# Patient Record
Sex: Female | Born: 1945
Health system: Southern US, Community
[De-identification: ages and names within clinical notes are randomized; demographics above are authoritative.]

## PROBLEM LIST (undated history)

## (undated) DIAGNOSIS — G629 Polyneuropathy, unspecified: Secondary | ICD-10-CM

## (undated) DIAGNOSIS — R0600 Dyspnea, unspecified: Secondary | ICD-10-CM

## (undated) DIAGNOSIS — K635 Polyp of colon: Secondary | ICD-10-CM

## (undated) DIAGNOSIS — H3530 Unspecified macular degeneration: Secondary | ICD-10-CM

## (undated) DIAGNOSIS — K219 Gastro-esophageal reflux disease without esophagitis: Secondary | ICD-10-CM

## (undated) DIAGNOSIS — E785 Hyperlipidemia, unspecified: Secondary | ICD-10-CM

## (undated) DIAGNOSIS — Z9889 Other specified postprocedural states: Secondary | ICD-10-CM

## (undated) DIAGNOSIS — Z800 Family history of malignant neoplasm of digestive organs: Secondary | ICD-10-CM

## (undated) DIAGNOSIS — Z923 Personal history of irradiation: Secondary | ICD-10-CM

## (undated) DIAGNOSIS — Z803 Family history of malignant neoplasm of breast: Secondary | ICD-10-CM

## (undated) DIAGNOSIS — H269 Unspecified cataract: Secondary | ICD-10-CM

## (undated) DIAGNOSIS — T7840XA Allergy, unspecified, initial encounter: Secondary | ICD-10-CM

## (undated) DIAGNOSIS — E039 Hypothyroidism, unspecified: Secondary | ICD-10-CM

## (undated) DIAGNOSIS — H35039 Hypertensive retinopathy, unspecified eye: Secondary | ICD-10-CM

## (undated) DIAGNOSIS — R112 Nausea with vomiting, unspecified: Secondary | ICD-10-CM

## (undated) DIAGNOSIS — I10 Essential (primary) hypertension: Secondary | ICD-10-CM

## (undated) DIAGNOSIS — F329 Major depressive disorder, single episode, unspecified: Secondary | ICD-10-CM

## (undated) DIAGNOSIS — F32A Depression, unspecified: Secondary | ICD-10-CM

## (undated) DIAGNOSIS — C50919 Malignant neoplasm of unspecified site of unspecified female breast: Secondary | ICD-10-CM

## (undated) HISTORY — DX: Depression, unspecified: F32.A

## (undated) HISTORY — DX: Unspecified macular degeneration: H35.30

## (undated) HISTORY — PX: ABDOMINAL HYSTERECTOMY: SHX81

## (undated) HISTORY — PX: CATARACT EXTRACTION, BILATERAL: SHX1313

## (undated) HISTORY — PX: SHOULDER SURGERY: SHX246

## (undated) HISTORY — DX: Personal history of irradiation: Z92.3

## (undated) HISTORY — DX: Gastro-esophageal reflux disease without esophagitis: K21.9

## (undated) HISTORY — DX: Allergy, unspecified, initial encounter: T78.40XA

## (undated) HISTORY — DX: Family history of malignant neoplasm of breast: Z80.3

## (undated) HISTORY — DX: Essential (primary) hypertension: I10

## (undated) HISTORY — PX: KNEE SURGERY: SHX244

## (undated) HISTORY — DX: Family history of malignant neoplasm of digestive organs: Z80.0

## (undated) HISTORY — DX: Polyp of colon: K63.5

## (undated) HISTORY — DX: Hypertensive retinopathy, unspecified eye: H35.039

## (undated) HISTORY — DX: Unspecified cataract: H26.9

## (undated) HISTORY — DX: Major depressive disorder, single episode, unspecified: F32.9

## (undated) HISTORY — DX: Hyperlipidemia, unspecified: E78.5

---
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
## 1998-06-17 ENCOUNTER — Emergency Department (HOSPITAL_COMMUNITY): Admission: EM | Admit: 1998-06-17 | Discharge: 1998-06-17 | Payer: Self-pay | Admitting: Emergency Medicine

## 1998-06-21 ENCOUNTER — Encounter: Admission: RE | Admit: 1998-06-21 | Discharge: 1998-06-21 | Payer: Self-pay | Admitting: Family Medicine

## 1998-11-24 ENCOUNTER — Encounter: Admission: RE | Admit: 1998-11-24 | Discharge: 1998-11-24 | Payer: Self-pay | Admitting: Family Medicine

## 1998-12-29 ENCOUNTER — Encounter: Admission: RE | Admit: 1998-12-29 | Discharge: 1998-12-29 | Payer: Self-pay | Admitting: Family Medicine

## 1999-01-25 ENCOUNTER — Encounter: Admission: RE | Admit: 1999-01-25 | Discharge: 1999-01-25 | Payer: Self-pay | Admitting: Family Medicine

## 1999-03-07 ENCOUNTER — Other Ambulatory Visit: Admission: RE | Admit: 1999-03-07 | Discharge: 1999-03-07 | Payer: Self-pay | Admitting: *Deleted

## 1999-03-24 ENCOUNTER — Encounter: Admission: RE | Admit: 1999-03-24 | Discharge: 1999-03-24 | Payer: Self-pay | Admitting: Family Medicine

## 1999-04-19 ENCOUNTER — Encounter: Admission: RE | Admit: 1999-04-19 | Discharge: 1999-04-19 | Payer: Self-pay | Admitting: Sports Medicine

## 1999-06-09 ENCOUNTER — Ambulatory Visit (HOSPITAL_COMMUNITY): Admission: RE | Admit: 1999-06-09 | Discharge: 1999-06-09 | Payer: Self-pay | Admitting: Gastroenterology

## 1999-07-25 ENCOUNTER — Encounter: Admission: RE | Admit: 1999-07-25 | Discharge: 1999-07-25 | Payer: Self-pay | Admitting: Family Medicine

## 1999-08-10 ENCOUNTER — Encounter: Admission: RE | Admit: 1999-08-10 | Discharge: 1999-08-10 | Payer: Self-pay | Admitting: Family Medicine

## 1999-09-13 ENCOUNTER — Encounter: Admission: RE | Admit: 1999-09-13 | Discharge: 1999-09-13 | Payer: Self-pay | Admitting: Family Medicine

## 1999-10-18 ENCOUNTER — Encounter: Admission: RE | Admit: 1999-10-18 | Discharge: 1999-10-18 | Payer: Self-pay | Admitting: Sports Medicine

## 2000-01-13 ENCOUNTER — Encounter: Admission: RE | Admit: 2000-01-13 | Discharge: 2000-01-13 | Payer: Self-pay | Admitting: Family Medicine

## 2000-04-20 ENCOUNTER — Encounter: Admission: RE | Admit: 2000-04-20 | Discharge: 2000-04-20 | Payer: Self-pay | Admitting: Sports Medicine

## 2000-09-13 ENCOUNTER — Encounter: Admission: RE | Admit: 2000-09-13 | Discharge: 2000-09-13 | Payer: Self-pay | Admitting: Family Medicine

## 2000-09-20 ENCOUNTER — Encounter: Admission: RE | Admit: 2000-09-20 | Discharge: 2000-09-20 | Payer: Self-pay | Admitting: Family Medicine

## 2000-09-24 ENCOUNTER — Encounter: Admission: RE | Admit: 2000-09-24 | Discharge: 2000-09-24 | Payer: Self-pay | Admitting: Family Medicine

## 2001-02-19 ENCOUNTER — Encounter: Admission: RE | Admit: 2001-02-19 | Discharge: 2001-02-19 | Payer: Self-pay | Admitting: Family Medicine

## 2001-03-13 ENCOUNTER — Encounter: Admission: RE | Admit: 2001-03-13 | Discharge: 2001-03-13 | Payer: Self-pay | Admitting: Family Medicine

## 2001-04-05 ENCOUNTER — Encounter: Admission: RE | Admit: 2001-04-05 | Discharge: 2001-04-05 | Payer: Self-pay | Admitting: Family Medicine

## 2001-07-02 ENCOUNTER — Encounter: Admission: RE | Admit: 2001-07-02 | Discharge: 2001-07-02 | Payer: Self-pay | Admitting: Family Medicine

## 2001-07-08 ENCOUNTER — Encounter: Admission: RE | Admit: 2001-07-08 | Discharge: 2001-07-08 | Payer: Self-pay | Admitting: Family Medicine

## 2001-07-11 ENCOUNTER — Encounter: Admission: RE | Admit: 2001-07-11 | Discharge: 2001-07-11 | Payer: Self-pay | Admitting: Family Medicine

## 2001-11-05 ENCOUNTER — Encounter: Admission: RE | Admit: 2001-11-05 | Discharge: 2001-11-05 | Payer: Self-pay | Admitting: Family Medicine

## 2002-03-24 ENCOUNTER — Encounter: Admission: RE | Admit: 2002-03-24 | Discharge: 2002-03-24 | Payer: Self-pay | Admitting: Family Medicine

## 2002-03-31 ENCOUNTER — Encounter: Admission: RE | Admit: 2002-03-31 | Discharge: 2002-03-31 | Payer: Self-pay | Admitting: Family Medicine

## 2002-04-02 ENCOUNTER — Encounter: Admission: RE | Admit: 2002-04-02 | Discharge: 2002-04-02 | Payer: Self-pay | Admitting: Family Medicine

## 2002-04-04 ENCOUNTER — Encounter: Admission: RE | Admit: 2002-04-04 | Discharge: 2002-04-04 | Payer: Self-pay | Admitting: Family Medicine

## 2002-04-24 ENCOUNTER — Encounter (INDEPENDENT_AMBULATORY_CARE_PROVIDER_SITE_OTHER): Payer: Self-pay | Admitting: *Deleted

## 2002-04-25 ENCOUNTER — Encounter: Admission: RE | Admit: 2002-04-25 | Discharge: 2002-04-25 | Payer: Self-pay | Admitting: Family Medicine

## 2002-05-23 ENCOUNTER — Encounter: Admission: RE | Admit: 2002-05-23 | Discharge: 2002-05-23 | Payer: Self-pay | Admitting: Family Medicine

## 2002-06-10 ENCOUNTER — Encounter: Admission: RE | Admit: 2002-06-10 | Discharge: 2002-06-10 | Payer: Self-pay | Admitting: Family Medicine

## 2003-05-29 ENCOUNTER — Encounter: Payer: Self-pay | Admitting: Emergency Medicine

## 2003-05-29 ENCOUNTER — Emergency Department (HOSPITAL_COMMUNITY): Admission: EM | Admit: 2003-05-29 | Discharge: 2003-05-29 | Payer: Self-pay | Admitting: Emergency Medicine

## 2003-06-18 ENCOUNTER — Encounter: Admission: RE | Admit: 2003-06-18 | Discharge: 2003-06-18 | Payer: Self-pay | Admitting: Family Medicine

## 2003-12-03 ENCOUNTER — Encounter: Admission: RE | Admit: 2003-12-03 | Discharge: 2003-12-03 | Payer: Self-pay | Admitting: Family Medicine

## 2004-02-12 ENCOUNTER — Emergency Department (HOSPITAL_COMMUNITY): Admission: EM | Admit: 2004-02-12 | Discharge: 2004-02-12 | Payer: Self-pay | Admitting: Emergency Medicine

## 2004-04-01 ENCOUNTER — Ambulatory Visit (HOSPITAL_COMMUNITY): Admission: RE | Admit: 2004-04-01 | Discharge: 2004-04-01 | Payer: Self-pay | Admitting: Gastroenterology

## 2004-04-01 ENCOUNTER — Encounter (INDEPENDENT_AMBULATORY_CARE_PROVIDER_SITE_OTHER): Payer: Self-pay | Admitting: *Deleted

## 2005-01-23 ENCOUNTER — Ambulatory Visit: Payer: Self-pay | Admitting: Family Medicine

## 2005-01-31 ENCOUNTER — Ambulatory Visit: Payer: Self-pay | Admitting: Sports Medicine

## 2006-03-01 ENCOUNTER — Ambulatory Visit: Payer: Self-pay | Admitting: Family Medicine

## 2006-03-01 ENCOUNTER — Encounter: Admission: RE | Admit: 2006-03-01 | Discharge: 2006-03-01 | Payer: Self-pay | Admitting: Sports Medicine

## 2007-02-21 DIAGNOSIS — J329 Chronic sinusitis, unspecified: Secondary | ICD-10-CM | POA: Insufficient documentation

## 2007-02-21 DIAGNOSIS — J309 Allergic rhinitis, unspecified: Secondary | ICD-10-CM | POA: Insufficient documentation

## 2007-02-21 DIAGNOSIS — I10 Essential (primary) hypertension: Secondary | ICD-10-CM | POA: Insufficient documentation

## 2007-02-21 DIAGNOSIS — K5730 Diverticulosis of large intestine without perforation or abscess without bleeding: Secondary | ICD-10-CM | POA: Insufficient documentation

## 2007-02-21 DIAGNOSIS — E781 Pure hyperglyceridemia: Secondary | ICD-10-CM | POA: Insufficient documentation

## 2007-02-21 DIAGNOSIS — N3941 Urge incontinence: Secondary | ICD-10-CM | POA: Insufficient documentation

## 2007-02-21 DIAGNOSIS — E039 Hypothyroidism, unspecified: Secondary | ICD-10-CM | POA: Insufficient documentation

## 2007-02-21 DIAGNOSIS — F17200 Nicotine dependence, unspecified, uncomplicated: Secondary | ICD-10-CM | POA: Insufficient documentation

## 2007-02-21 DIAGNOSIS — R12 Heartburn: Secondary | ICD-10-CM | POA: Insufficient documentation

## 2007-02-21 DIAGNOSIS — F339 Major depressive disorder, recurrent, unspecified: Secondary | ICD-10-CM | POA: Insufficient documentation

## 2007-02-21 DIAGNOSIS — E785 Hyperlipidemia, unspecified: Secondary | ICD-10-CM | POA: Insufficient documentation

## 2007-02-22 ENCOUNTER — Encounter (INDEPENDENT_AMBULATORY_CARE_PROVIDER_SITE_OTHER): Payer: Self-pay | Admitting: *Deleted

## 2007-03-28 ENCOUNTER — Telehealth: Payer: Self-pay | Admitting: Family Medicine

## 2007-04-24 ENCOUNTER — Encounter: Payer: Self-pay | Admitting: *Deleted

## 2007-04-24 ENCOUNTER — Encounter: Payer: Self-pay | Admitting: Family Medicine

## 2007-04-24 ENCOUNTER — Ambulatory Visit: Payer: Self-pay | Admitting: Family Medicine

## 2007-04-24 DIAGNOSIS — J45909 Unspecified asthma, uncomplicated: Secondary | ICD-10-CM | POA: Insufficient documentation

## 2007-04-24 LAB — CONVERTED CEMR LAB
AST: 15 units/L (ref 0–37)
BUN: 20 mg/dL (ref 6–23)
Calcium: 9.4 mg/dL (ref 8.4–10.5)
Chloride: 101 meq/L (ref 96–112)
Creatinine, Ser: 0.8 mg/dL (ref 0.40–1.20)
Direct LDL: 177 mg/dL — ABNORMAL HIGH
Glucose, Bld: 78 mg/dL (ref 70–99)
TSH: 0.035 microintl units/mL — ABNORMAL LOW (ref 0.350–5.50)

## 2007-04-25 ENCOUNTER — Encounter: Payer: Self-pay | Admitting: Family Medicine

## 2007-06-05 ENCOUNTER — Ambulatory Visit: Payer: Self-pay | Admitting: Family Medicine

## 2007-07-15 ENCOUNTER — Encounter: Payer: Self-pay | Admitting: Family Medicine

## 2007-07-17 ENCOUNTER — Encounter: Payer: Self-pay | Admitting: Family Medicine

## 2007-07-19 ENCOUNTER — Encounter: Payer: Self-pay | Admitting: Family Medicine

## 2007-12-03 ENCOUNTER — Ambulatory Visit: Payer: Self-pay | Admitting: Family Medicine

## 2007-12-04 ENCOUNTER — Encounter: Payer: Self-pay | Admitting: Family Medicine

## 2007-12-04 ENCOUNTER — Ambulatory Visit: Payer: Self-pay | Admitting: Family Medicine

## 2007-12-04 LAB — CONVERTED CEMR LAB
ALT: 22 units/L (ref 0–35)
AST: 13 units/L (ref 0–37)
Albumin: 4.1 g/dL (ref 3.5–5.2)
Alkaline Phosphatase: 63 units/L (ref 39–117)
Glucose, Bld: 82 mg/dL (ref 70–99)
LDL Cholesterol: 68 mg/dL (ref 0–99)
Potassium: 4.1 meq/L (ref 3.5–5.3)
Sodium: 139 meq/L (ref 135–145)
TSH: 0.054 microintl units/mL — ABNORMAL LOW (ref 0.350–5.50)
Total Bilirubin: 0.4 mg/dL (ref 0.3–1.2)
Total Protein: 7 g/dL (ref 6.0–8.3)
VLDL: 33 mg/dL (ref 0–40)

## 2007-12-13 ENCOUNTER — Encounter: Payer: Self-pay | Admitting: Family Medicine

## 2008-01-21 ENCOUNTER — Encounter: Payer: Self-pay | Admitting: Family Medicine

## 2008-04-20 ENCOUNTER — Ambulatory Visit: Payer: Self-pay | Admitting: Family Medicine

## 2008-04-20 ENCOUNTER — Encounter: Payer: Self-pay | Admitting: Family Medicine

## 2008-04-21 LAB — CONVERTED CEMR LAB: TSH: 2.123 microintl units/mL (ref 0.350–5.50)

## 2008-08-12 ENCOUNTER — Telehealth: Payer: Self-pay | Admitting: *Deleted

## 2008-11-13 ENCOUNTER — Telehealth: Payer: Self-pay | Admitting: *Deleted

## 2008-12-04 ENCOUNTER — Ambulatory Visit: Payer: Self-pay | Admitting: Family Medicine

## 2008-12-04 ENCOUNTER — Encounter: Payer: Self-pay | Admitting: Family Medicine

## 2008-12-04 LAB — CONVERTED CEMR LAB

## 2008-12-09 ENCOUNTER — Encounter: Payer: Self-pay | Admitting: Family Medicine

## 2008-12-09 ENCOUNTER — Ambulatory Visit: Payer: Self-pay | Admitting: Family Medicine

## 2008-12-09 LAB — CONVERTED CEMR LAB
ALT: 24 units/L (ref 0–35)
Alkaline Phosphatase: 62 units/L (ref 39–117)
Cholesterol: 188 mg/dL (ref 0–200)
Creatinine, Ser: 1.02 mg/dL (ref 0.40–1.20)
LDL Cholesterol: 106 mg/dL — ABNORMAL HIGH (ref 0–99)
Sodium: 138 meq/L (ref 135–145)
Total Bilirubin: 0.4 mg/dL (ref 0.3–1.2)
Total CHOL/HDL Ratio: 4.8
Total Protein: 7.4 g/dL (ref 6.0–8.3)
VLDL: 43 mg/dL — ABNORMAL HIGH (ref 0–40)

## 2008-12-21 ENCOUNTER — Telehealth: Payer: Self-pay | Admitting: *Deleted

## 2009-03-17 ENCOUNTER — Telehealth: Payer: Self-pay | Admitting: *Deleted

## 2009-05-19 ENCOUNTER — Telehealth: Payer: Self-pay | Admitting: *Deleted

## 2009-09-15 ENCOUNTER — Telehealth: Payer: Self-pay | Admitting: Family Medicine

## 2009-09-15 ENCOUNTER — Encounter: Admission: RE | Admit: 2009-09-15 | Discharge: 2009-09-15 | Payer: Self-pay | Admitting: Sports Medicine

## 2009-09-15 ENCOUNTER — Ambulatory Visit: Payer: Self-pay | Admitting: Family Medicine

## 2009-09-28 ENCOUNTER — Telehealth: Payer: Self-pay | Admitting: Family Medicine

## 2009-12-15 ENCOUNTER — Telehealth (INDEPENDENT_AMBULATORY_CARE_PROVIDER_SITE_OTHER): Payer: Self-pay | Admitting: *Deleted

## 2009-12-16 ENCOUNTER — Ambulatory Visit: Payer: Self-pay | Admitting: Family Medicine

## 2009-12-30 ENCOUNTER — Encounter: Payer: Self-pay | Admitting: Family Medicine

## 2010-01-11 ENCOUNTER — Ambulatory Visit: Payer: Self-pay | Admitting: Family Medicine

## 2010-01-20 ENCOUNTER — Encounter: Payer: Self-pay | Admitting: Family Medicine

## 2010-01-21 ENCOUNTER — Encounter: Payer: Self-pay | Admitting: Family Medicine

## 2010-01-21 LAB — CONVERTED CEMR LAB
AST: 16 units/L
Alkaline Phosphatase: 63 units/L
BUN: 14 mg/dL
Calcium: 9.2 mg/dL
GFR calc Af Amer: 0.4 mL/min
Glucose, Bld: 84 mg/dL
HCT: 43.6 %
MCV: 95.4 fL
Potassium: 3.9 meq/L
Sodium: 136 meq/L

## 2010-03-18 ENCOUNTER — Encounter: Payer: Self-pay | Admitting: Family Medicine

## 2010-03-22 ENCOUNTER — Encounter: Payer: Self-pay | Admitting: Family Medicine

## 2010-05-19 ENCOUNTER — Telehealth: Payer: Self-pay | Admitting: Family Medicine

## 2010-05-30 ENCOUNTER — Telehealth: Payer: Self-pay | Admitting: Family Medicine

## 2010-10-20 ENCOUNTER — Telehealth: Payer: Self-pay | Admitting: Family Medicine

## 2010-11-05 ENCOUNTER — Emergency Department (HOSPITAL_COMMUNITY): Admission: EM | Admit: 2010-11-05 | Discharge: 2010-11-05 | Payer: Self-pay | Admitting: Emergency Medicine

## 2011-01-24 ENCOUNTER — Telehealth: Payer: Self-pay | Admitting: Family Medicine

## 2011-01-24 NOTE — Assessment & Plan Note (Signed)
 Summary: bronchitis per pt/Shannon/everhart   Vital Signs:  Patient profile:   65 year old female Height:      62 inches Weight:      192.2 pounds BMI:     35.28 O2 Sat:      96 % on Room air Temp:     98.0 degrees F oral Pulse rate:   88 / minute BP sitting:   149 / 85  (left arm) Cuff size:   large  Vitals Entered By: Katie Mulberry LPN (September 15, 2009 10:20 AM)  O2 Flow:  Room air CC: ? bronchitis x 6 days Is Patient Diabetic? No Pain Assessment Patient in pain? no        Primary Care Provider:  JEREL PRESCOTT  MD  CC:  ? bronchitis x 6 days.  History of Present Illness: 24F with cough for 6 days, hx smoking, 1/2PPD, hx asthma.  Staying about the same.  Started this past thursday.  No fevers/chills, no N/V/D, no muscle aches or body aches. Nose running, lots of chest congestion.  Coughing up yellowish mucus, no blood.  No CP, mild SOB, wheeze, no swelling in LE. No sick contacts.    Habits & Providers  Alcohol-Tobacco-Diet     Tobacco Status: current     Cigarette Packs/Day: 1.0  Past History:  Past Medical History: Last updated: 12/03/2007 Rectocele, Recurrent sinusitis (Ceftin 250mg  bid); routine colonoscopys, mammograms.  Past Surgical History: Last updated: 12/03/2007 deviated septum -, hysterectomy - 03/99 -, lumpectomy X 5 -, Oophorectomy -, T & A - age 43 -; 3 polyps removed with last colonscopy (gets q 5 years).  Family History: Last updated: 12/04/2008 Colon CA - great grandmother, one aunt, two uncles Postmenopausal cancer in paternal aunt.  Social History: Last updated: 12/04/2008 Lives with her husband of 35 years.  Husband heavily involved in shriners. Has 2 children and 4 grandchildren.  Smokes 1 ppd, not interested in quitting at this time. Drinks 2-3 glasses of wine per week. Enjoys watching her grandchildren's band concerts. Retired runner, broadcasting/film/video - substitutes occasionally.  Social History: Packs/Day:  1.0  Review of Systems       See  HPI  Physical Exam  General:  Well-developed,well-nourished,in no acute distress; alert,appropriate and cooperative throughout examination Head:  Normocephalic and atraumatic without obvious abnormalities. No apparent alopecia or balding. Ears:  Cerumen impaction in right EAM.  Flushed, clear. Nose:  External nasal examination shows no deformity or inflammation. Nasal mucosa are pink and moist without lesions or exudates. Mouth:  Oral mucosa and oropharynx without lesions or exudates.  Lungs:  Good air movement but exp wheezes present throughout, rhonchi present throughout, some dullness to percussion in left lower field posteriorly. Heart:  Normal rate and regular rhythm. S1 and S2 normal without gallop, murmur, click, rub or other extra sounds. Extremities:  No edema.   Impression & Recommendations:  Problem # 1:  COUGH (ICD-786.2) Assessment New Stable, normal spO2.  Exam suggestive of acute bronchitis superimposed on obstructive asthma.  Pt still smoking, encouraged to abstain.  With abnormal lung exam however will need to get CXR to r/o infiltrate.  Will treat as exacerbation of COPD (Acute bronchitis on top of asthma) with Doxy, prednisone , bronchodilators, zyrtec/flonase  for coryza symptoms, stay hydrated, RTC if no better in 2 weeks.  If PNA identified, can come back for rocephin shot and azithromycin for outpt treatment.  Orders: CXR- 2view (CXR) [facility]- Est Level  3 (00786)  Complete Medication List: 1)  Albuterol  90  Mcg/act Aers (Albuterol ) .... Inhale 2 puff using inhaler every four hour. dispense 1 inhaler 2)  Prilosec Otc 20 Mg Tbec (Omeprazole  magnesium ) .... Take 1 tablet by mouth once a day 3)  Simvastatin  20 Mg Tabs (Simvastatin ) .... Take 1 tablet by mouth at bedtime 4)  Synthroid  100 Mcg Tabs (Levothyroxine  sodium) .... Take one by mouth daily 5)  Lisinopril -hydrochlorothiazide  20-25 Mg Tabs (Lisinopril -hydrochlorothiazide ) .... One by mouth daily 6)  Imipramine  Hcl 50  Mg Tabs (Imipramine  hcl) .... 2 tabs by mouth daily 7)  Doxycycline Hyclate 100 Mg Caps (Doxycycline hyclate) .... One tab by mouth two times a day x 7 days 8)  Zyrtec Allergy 10 Mg Caps (Cetirizine hcl) .... One tab by mouth daily 9)  Flonase  50 Mcg/act Susp (Fluticasone  propionate) .... One spray in each nostril bid 10)  Prednisone  20 Mg Tabs (Prednisone ) .... Two tabs by mouth daily x 5 days 11)  Mucinex Maximum Strength 1200 Mg Xr12h-tab (Guaifenesin) .... One tab by mouth two times a day  Patient Instructions: 1)  Great to meet you today, 2)  You likely have a bronchitis exacerbation,  worsened by your smoking. 3)  We will treat aggressively with doxycycline for 7 days, prednisone  for 5 days, mucinex, and an antihistamine.  You can pick up the flonase  nasal spray if your runny nose doesn't resolve. 4)  We also flushed your ear today. 5)  -Come back in 2 weeks if not feeling ANY better.  Be sure to complete the courses of antibiotics and prednisone . 6)  -Dr. ONEIDA.  Prescriptions: MUCINEX MAXIMUM STRENGTH 1200 MG XR12H-TAB (GUAIFENESIN) One tab by mouth two times a day  #14 x 0   Entered and Authorized by:   Debby Petties MD   Signed by:   Debby Petties MD on 09/15/2009   Method used:   Electronically to        [facility] (retail)       [address]       [place], [place]  [address]       Ph: [phone]       Fax: [phone]   RxID:   [id number] DOXYCYCLINE HYCLATE 100 MG CAPS (DOXYCYCLINE HYCLATE) One tab by mouth two times a day x 7 days  #14 x 0   Entered and Authorized by:   Debby Petties MD   Signed by:   Debby Petties MD on 09/15/2009   Method used:   Electronically to        [facility] (retail)       [address]       [place], [place]  [address]       Ph: [phone]       Fax: [phone]   RxID:   [id number] PREDNISONE  20 MG TABS (PREDNISONE ) Two tabs by mouth daily x 5  days  #10 x 0   Entered and Authorized by:   Debby Petties MD   Signed by:   Debby Petties MD on 09/15/2009   Method used:   Electronically to        [facility] (retail)       [address]       [place], [place]  [address]       Ph: [phone]       Fax: [phone]  RxID:   [id number] FLONASE  50 MCG/ACT SUSP (FLUTICASONE  PROPIONATE) One spray in each nostril BID  #1 bottle x 0   Entered and Authorized by:   Debby Petties MD   Signed by:   Debby Petties MD on 09/15/2009   Method used:   Electronically to        [facility] (retail)       [address]       [place], [place]  [address]       Ph: [phone]       Fax: [phone]   RxID:   [id number] ZYRTEC ALLERGY 10 MG CAPS (CETIRIZINE HCL) One tab by mouth daily  #10 x 0   Entered and Authorized by:   Debby Petties MD   Signed by:   Debby Petties MD on 09/15/2009   Method used:   Electronically to        [facility] (retail)       [address]       [place], [place]  [address]       Ph: [phone]       Fax: [phone]   RxID:   [id number]

## 2011-01-24 NOTE — Progress Notes (Signed)
Summary: RX  Phone Note Refill Request Call back at Home Phone [phone]   Refills Requested: Medication #1:  ALBUTEROL 90 MCG/ACT AERS Inhale 2 puff using inhaler every four hour. Dispense 1 inhaler pt requesting generic, pt would like a call once done.  Initial call taken by: Knox Royalty,  May 30, 2010 8:34 AM    Prescriptions: ALBUTEROL 90 MCG/ACT AERS (ALBUTEROL) Inhale 2 puff using inhaler every four hour. Dispense 1 inhaler  #1 x 5   Entered and Authorized by:   Myrtie Soman  MD   Signed by:   Myrtie Soman  MD on 05/30/2010   Method used:   Electronically to        [facility] (retail)       [address]       [place], [place]  [address]       Ph: [phone]       Fax: [phone]   RxID:   [id number]

## 2011-01-24 NOTE — Miscellaneous (Signed)
Summary: lab update  Clinical Lists Changes  Observations: Added new observation of PLATELETK/UL: 316 K/uL (01/21/2010 21:17) Added new observation of MCV: 95.4 fL (01/21/2010 21:17) Added new observation of HCT: 43.6 % (01/21/2010 21:17) Added new observation of HGB: 14.2 g/dL (16/09/9603 54:09) Added new observation of WBC COUNT: 8.6 10*3/microliter (01/21/2010 21:17) Added new observation of CALCIUM: 9.2 mg/dL (81/19/1478 29:56) Added new observation of ALBUMIN: 4.3 g/dL (21/30/8657 84:69) Added new observation of SGPT (ALT): 22 units/L (01/21/2010 21:17) Added new observation of SGOT (AST): 16 units/L (01/21/2010 21:17) Added new observation of ALK PHOS: 63 units/L (01/21/2010 21:17) Added new observation of GFRAA: 0.4 mL/min (01/21/2010 21:17) Added new observation of CREATININE: 0.89 mg/dL (62/95/2841 32:44) Added new observation of BUN: 14 mg/dL (12/27/7251 66:44) Added new observation of BG RANDOM: 84 mg/dL (03/47/4259 56:38) Added new observation of CO2 PLSM/SER: 25 meq/L (01/21/2010 21:17) Added new observation of CL SERUM: 99 meq/L (01/21/2010 21:17) Added new observation of K SERUM: 3.9 meq/L (01/21/2010 21:17) Added new observation of NA: 136 meq/L (01/21/2010 21:17)

## 2011-01-24 NOTE — Progress Notes (Signed)
Summary: refill  Phone Note Refill Request Call back at Home Phone [phone] Message from:  Patient  Refills Requested: Medication #1:  IMIPRAMINE HCL 50 MG TABS take 3 tablets at night for depression pt didn't realize she was out and needs enough to last until other meds come in from [facility] - needs at least a 7 day supply [facility]  Initial call taken by: De Nurse,  October 20, 2010 9:32 AM  Follow-up for Phone Call       Follow-up by: Antoine Primas DO,  October 20, 2010 1:14 PM    Prescriptions: IMIPRAMINE HCL 50 MG TABS (IMIPRAMINE HCL) take 3 tablets at night for depression  #270 x 0   Entered and Authorized by:   Antoine Primas DO   Signed by:   Antoine Primas DO on 10/20/2010   Method used:   Electronically to        [facility] (retail)       [address]       [place], [place]  [address]       Ph: [phone]       Fax: [phone]   RxID:   [id number]

## 2011-01-24 NOTE — Miscellaneous (Signed)
Summary: colonoscopy report  Clinical Lists Changes  Observations: Added new observation of COLONNXTDUE: 03/18/2013 (03/22/2010 14:16) Added new observation of DM PROGRESS: N/A (03/22/2010 14:16) Added new observation of DM FSREVIEW: N/A (03/22/2010 14:16) Added new observation of COLONOSCOPY: sessile polyps, otherwise normal. f/u in 3 years (03/18/2010 14:16)      Prevention & Chronic Care Immunizations   Influenza vaccine: Fluvax Non-MCR  (12/16/2009)   Influenza vaccine due: 12/02/2008    Tetanus booster: 02/28/2006: Done.   Tetanus booster due: 02/29/2016    Pneumococcal vaccine: Pneumovax  (01/11/2010)    H. zoster vaccine: Not documented  Colorectal Screening   Hemoccult: n/a  (12/04/2008)   Hemoccult due: Not Indicated    Colonoscopy: sessile polyps, otherwise normal. f/u in 3 years  (03/18/2010)   Colonoscopy due: 03/18/2013  Other Screening   Pap smear: n/a  (12/04/2008)   Pap smear due: Not Indicated    Mammogram: normal  (01/04/2010)   Mammogram due: 01/04/2011    DXA bone density scan: Not documented   Smoking status: current  (01/11/2010)   Smoking cessation counseling: YES  (12/16/2009)  Lipids   Total Cholesterol: 188  (12/09/2008)   LDL: 106  (12/09/2008)   LDL Direct: 177  (04/24/2007)   HDL: 39  (12/09/2008)   Triglycerides: 216  (12/09/2008)    SGOT (AST): 16  (01/21/2010)   SGPT (ALT): 22  (01/21/2010)   Alkaline phosphatase: 63  (01/21/2010)   Total bilirubin: 0.4  (12/09/2008)  Hypertension   Last Blood Pressure: 112 / 68  (01/11/2010)   Serum creatinine: 0.89  (01/21/2010)   Serum potassium 3.9  (01/21/2010)  Self-Management Support :   Personal Goals (by the next clinic visit) :      Personal blood pressure goal: 140/90  (01/11/2010)     Personal LDL goal: 100  (01/11/2010)    Hypertension self-management support: Not documented    Lipid self-management support: Not documented

## 2011-01-24 NOTE — Consult Note (Signed)
Summary: [facility]   Imported By: De Nurse 01/28/2010 16:59:03  _____________________________________________________________________  External Attachment:    Type:   Image     Comment:   External Document

## 2011-01-24 NOTE — Progress Notes (Signed)
Summary: Rx Req  Phone Note Refill Request Call back at [phone] Message from:  Patient  Refills Requested: Medication #1:  IMIPRAMINE HCL 50 MG TABS take 3 tablets at night for depression [facility] [address].  PT SAYS SHE IS GOING OUT OF TOWN TOMORROW CAN IT BE FILLED TODAY.  Initial call taken by: Clydell Hakim,  May 19, 2010 10:29 AM    Prescriptions: IMIPRAMINE HCL 50 MG TABS (IMIPRAMINE HCL) take 3 tablets at night for depression  #90 x 0   Entered and Authorized by:   Myrtie Soman  MD   Signed by:   Myrtie Soman  MD on 05/19/2010   Method used:   Electronically to        [facility] (retail)       [address]       [place], [place]  [address]       Ph: [phone]       Fax: [phone]   RxID:   [id number]

## 2011-01-24 NOTE — Assessment & Plan Note (Signed)
Summary: cpe & flu shot,tcb   Vital Signs:  Patient profile:   65 year old female Height:      62 inches Weight:      193.1 pounds BMI:     35.45 Temp:     98.2 degrees F oral Pulse rate:   108 / minute BP sitting:   112 / 68  (left arm) Cuff size:   regular  Vitals Entered By: Garen Grams LPN (January 11, 2010 3:50 PM) CC: CPE Is Patient Diabetic? No Pain Assessment Patient in pain? no        Primary Care Provider:  Myrtie Soman  MD  CC:  CPE.  History of Present Illness: 1. depression States that she's increasing agitated and bothered, especially when it comes to caring for her parents. Gets upset more easily than she has previously. Reports increased feelings of stress and increase in depressed mood. Denies any SI.  2. smoking  Not ready to quit. Will let us know if she decides to stop.  3. prevention Eliglbe for PNA shot. Mammograms up to date. Has pre-colonoscopy eval soon - gets them every 5 years given her family history. s/p hysterectomy.   Habits & Providers  Alcohol-Tobacco-Diet     Tobacco Status: current     Cigarette Packs/Day: 0.5  Current Medications (verified): 1)  Albuterol 90 Mcg/act Aers (Albuterol) .... Inhale 2 Puff Using Inhaler Every Four Hour. Dispense 1 Inhaler 2)  Prilosec Otc 20 Mg Tbec (Omeprazole Magnesium) .... Take 1 Tablet By Mouth Once A Day 3)  Simvastatin 20 Mg Tabs (Simvastatin) .... Take 1 Tablet By Mouth At Bedtime 4)  Synthroid 100 Mcg  Tabs (Levothyroxine Sodium) .... Take One By Mouth Daily 5)  Lisinopril-Hydrochlorothiazide 20-25 Mg  Tabs (Lisinopril-Hydrochlorothiazide) .... One By Mouth Daily 6)  Imipramine Hcl 50 Mg  Tabs (Imipramine Hcl) .... 2 Tabs By Mouth Daily 7)  Flonase 50 Mcg/act Susp (Fluticasone Propionate) .... One Spray in Each Nostril Bid  Allergies (verified): No Known Drug Allergies  Past History:  Past Medical History: Rectocele, recurrent sinusitis; routine colonoscopys,  mammograms. Asthma  Family History: Colon CA - great grandmother, one aunt, two uncles Postmenopausal breast cancer in paternal aunt paternal grandfather leukemia  diabetes maternal aunt, uncle HTN - multiple family members  Social History: Lives with her husband of 35 years.  Husband heavily involved in shriners. Has 2 children and 4 grandchildren -- has a 5th on the way -- due 8/11 .  Smokes 1/2 ppd, not interested in quitting at this time.  Drinks 2-3 glasses of wine per week. Enjoys watching her grandchildren's band concerts. Retired Runner, broadcasting/film/video - substitutes occasionally.Packs/Day:  0.5  Review of Systems       denies chest pain, change in breathing; no bowel or bladder problems.  Physical Exam  Additional Exam:  General:  Vital signs reviewed -- overweight but otherwise normal Alert, appropriate; well-dressed and well-nourished Neck: no thyromegaly or mass Lungs:  work of breathing unlabored, clear to auscultation bilaterally; no wheezes, rales, or ronchi; good air movement throughout Heart:  regular rate and rhythm, no murmurs; normal s1/s2 Abd: +BS, soft, non-tender, non-distended; no masses; no rebound or guarding  GU: declined by patient.  Pulses:  DP and radial pulses 2+ bilaterally  Extremities:  no cyanosis, clubbing, or edema Neurologic:  alert and oriented. speech normal.  MSK: FROM grossly; no joint swelling, warmth, erythema.  Psych: full affect, pleasant. Normal speech. Denies SI.    Impression & Recommendations:  Problem #  1:  PHYSICAL EXAMINATION (ICD-V70.0) Assessment Unchanged  Doing well. PNA vaccine today given smoking hx.. Continue to discuss shingles vaccine. Have back for labs: CMP, lipids, TSH.   Orders: [facility] - Est  40-64 yrs ([address])  Problem # 2:  DEPRESSION, MAJOR, RECURRENT (ICD-296.30) Assessment: Comment Only increased agitation and depressed mood per patient. Will increase imipramine by 50 mg and follow. Pt to call for lack of response with  this increase.   Problem # 3:  HYPERLIPIDEMIA (ICD-272.4) Assessment: Comment Only check lipids. Has been off Simva for a while (just didn't get it refilled), restarted at visit with Dr. Sheffield Slider in December.  Her updated medication list for this problem includes:    Simvastatin 20 Mg Tabs (Simvastatin) .Marland Kitchen... Take 1 tablet by mouth at bedtime  Future Orders: Comp Met-[facility] ([id number]) ... 01/11/2011 Lipid-[facility] ([id number]) ... 01/11/2011  Complete Medication List: 1)  Albuterol 90 Mcg/act Aers (Albuterol) .... Inhale 2 puff using inhaler every four hour. dispense 1 inhaler 2)  Prilosec Otc 20 Mg Tbec (Omeprazole magnesium) .... Take 1 tablet by mouth once a day 3)  Simvastatin 20 Mg Tabs (Simvastatin) .... Take 1 tablet by mouth at bedtime 4)  Synthroid 100 Mcg Tabs (Levothyroxine sodium) .... Take one by mouth daily 5)  Lisinopril-hydrochlorothiazide 20-25 Mg Tabs (Lisinopril-hydrochlorothiazide) .... One by mouth daily 6)  Imipramine Hcl 50 Mg Tabs (Imipramine hcl) .... Take 3 tablets at night for depression 7)  Flonase 50 Mcg/act Susp (Fluticasone propionate) .... One spray in each nostril bid  Other Orders: Future Orders: Vit D, 25 [place]-[facility] ([id number]) ... 01/11/2011 TSH-[facility] [id number]) ... 01/11/2011  Patient Instructions: 1)  increase your imipramine to 3 tablets at night (150 mg total); I'll send in a new prescription for this. 2)  come back in the next week for fasting labs -- we open at 8:30 am. 3)  follow-up with me in 3-4 months to discuss your imipramine and check up on things in general.  4)  great to see you today. Prescriptions: IMIPRAMINE HCL 50 MG TABS (IMIPRAMINE HCL) take 3 tablets at night for depression  #90 x 2   Entered and Authorized by:   Myrtie Soman  MD   Signed by:   Myrtie Soman  MD on 01/11/2010   Method used:   Electronically to        [facility] (mail-order)             ,          Ph: [phone]       Fax: [phone]   RxID:    [id number] SIMVASTATIN 20 MG TABS (SIMVASTATIN) Take 1 tablet by mouth at bedtime  #90 x 2   Entered and Authorized by:   Myrtie Soman  MD   Signed by:   Myrtie Soman  MD on 01/11/2010   Method used:   Print then Give to Patient   RxID:   [id number] PRILOSEC OTC 20 MG TBEC (OMEPRAZOLE MAGNESIUM) Take 1 tablet by mouth once a day  #90 x 2   Entered and Authorized by:   Myrtie Soman  MD   Signed by:   Myrtie Soman  MD on 01/11/2010   Method used:   Print then Give to Patient   RxID:   [id number]    Prevention & Chronic Care Immunizations   Influenza vaccine: Fluvax Non-MCR  (12/16/2009)   Influenza vaccine due: 12/02/2008    Tetanus booster: 02/28/2006: Done.   Tetanus booster due: 02/29/2016  Pneumococcal vaccine: Not documented    H. zoster vaccine: Not documented  Colorectal Screening   Hemoccult: n/a  (12/04/2008)   Hemoccult due: Not Indicated    Colonoscopy: Done.  (03/25/2004)   Colonoscopy due: 03/25/2014  Other Screening   Pap smear: n/a  (12/04/2008)   Pap smear due: Not Indicated    Mammogram: normal  (01/04/2010)   Mammogram due: 01/04/2011    DXA bone density scan: Not documented   Smoking status: current  (01/11/2010)   Smoking cessation counseling: YES  (12/16/2009)  Lipids   Total Cholesterol: 188  (12/09/2008)   LDL: 106  (12/09/2008)   LDL Direct: 177  (04/24/2007)   HDL: 39  (12/09/2008)   Triglycerides: 216  (12/09/2008)    SGOT (AST): 15  (12/09/2008)   SGPT (ALT): 24  (12/09/2008) CMP ordered    Alkaline phosphatase: 62  (12/09/2008)   Total bilirubin: 0.4  (12/09/2008)    Lipid flowsheet reviewed?: Yes   Progress toward LDL goal: Unchanged  Hypertension   Last Blood Pressure: 112 / 68  (01/11/2010)   Serum creatinine: 1.02  (12/09/2008)   Serum potassium 4.0  (12/09/2008) CMP ordered     Hypertension flowsheet reviewed?: Yes   Progress toward BP goal: At goal  Self-Management Support :   Personal  Goals (by the next clinic visit) :      Personal blood pressure goal: 140/90  (01/11/2010)     Personal LDL goal: 100  (01/11/2010)    Hypertension self-management support: Not documented    Lipid self-management support: Not documented     Appended Document: cpe & flu shot,tcb   Pneumovax Vaccine    Vaccine Type: Pneumovax    Site: left deltoid    Mfr: Merck    Dose: 0.5 ml    Route: IM    Given by: Garen Grams LPN    Exp. Date: 12/04/2010    Lot #: [id number]    VIS given: 07/22/96 version given January 11, 2010.   Appended Document: cpe & flu shot,tcb    Clinical Lists Changes  Medications: Rx of PRILOSEC OTC 20 MG TBEC (OMEPRAZOLE MAGNESIUM) Take 1 tablet by mouth once a day;  #90 x 2;  Signed;  Entered by: Myrtie Soman  MD;  Authorized by: Myrtie Soman  MD;  Method used: Electronically to [facility] , ,   , Ph: [phone], Fax: [phone] Rx of SIMVASTATIN 20 MG TABS (SIMVASTATIN) Take 1 tablet by mouth at bedtime;  #90 x 2;  Signed;  Entered by: Myrtie Soman  MD;  Authorized by: Myrtie Soman  MD;  Method used: Electronically to [facility] , ,   , Ph: [phone], Fax: [phone]    Prescriptions: SIMVASTATIN 20 MG TABS (SIMVASTATIN) Take 1 tablet by mouth at bedtime  #90 x 2   Entered and Authorized by:   Myrtie Soman  MD   Signed by:   Myrtie Soman  MD on 01/11/2010   Method used:   Electronically to        [facility] (mail-order)             ,          Ph: [phone]       Fax: [phone]   RxID:   [id number] PRILOSEC OTC 20 MG TBEC (OMEPRAZOLE MAGNESIUM) Take 1 tablet by mouth once a day  #90 x 2   Entered and Authorized by:   Myrtie Soman  MD   Signed by:  Myrtie Soman  MD on 01/11/2010   Method used:   Electronically to        [facility]* (mail-order)             ,          Ph: [phone]       Fax: [phone]   RxID:   [id number]

## 2011-02-01 NOTE — Progress Notes (Signed)
Summary: Rx  Phone Note Refill Request Call back at [phone]   Refills Requested: Medication #1:  IMIPRAMINE HCL 50 MG TABS take 3 tablets at night for depression pt asking for 10 day supply to be sent to [facility] then her regular 3 month supply to be sent to [facility] mail order pharmacy. Pt will make March appt since Feb appts are booked.    Follow-up for Phone Call        done  Follow-up by: Antoine Primas DO,  January 25, 2011 12:23 PM    Prescriptions: IMIPRAMINE HCL 50 MG TABS (IMIPRAMINE HCL) take 3 tablets at night for depression  #270 x 3   Entered and Authorized by:   Antoine Primas DO   Signed by:   Antoine Primas DO on 01/25/2011   Method used:   Faxed to ...       [facility] (mail-order)             , [place]         Ph: [phone]       Fax: [phone]   RxID:   [id number] IMIPRAMINE HCL 50 MG TABS (IMIPRAMINE HCL) take 3 tablets at night for depression  #90 x 0   Entered and Authorized by:   Antoine Primas DO   Signed by:   Antoine Primas DO on 01/25/2011   Method used:   Electronically to        [facility] (retail)       [address]       [place], [place]  [address]       Ph: [phone]       Fax: [phone]   RxID:   [id number]

## 2011-03-06 ENCOUNTER — Telehealth: Payer: Self-pay | Admitting: Family Medicine

## 2011-03-06 MED ORDER — LISINOPRIL-HYDROCHLOROTHIAZIDE 20-25 MG PO TABS: 1.0000 | ORAL_TABLET | Freq: Every day | ORAL | Status: DC

## 2011-03-06 MED ORDER — LEVOTHYROXINE SODIUM 100 MCG PO TABS: 100.0000 ug | ORAL_TABLET | Freq: Every day | ORAL | Status: DC

## 2011-03-06 NOTE — Telephone Encounter (Signed)
Kimberly Waters need refill on her Lisinopril and Levothyroxine called to [facility] on [address]

## 2011-03-06 NOTE — Telephone Encounter (Signed)
Called Kimberly Waters, told him I would need to see him in 1 month, will give him 1 month but no refills until he follow up with a visit.

## 2011-03-16 ENCOUNTER — Other Ambulatory Visit: Payer: Self-pay | Admitting: Family Medicine

## 2011-03-16 NOTE — Telephone Encounter (Signed)
Refill request

## 2011-04-04 ENCOUNTER — Encounter: Payer: Self-pay | Admitting: Family Medicine

## 2011-04-04 ENCOUNTER — Ambulatory Visit (INDEPENDENT_AMBULATORY_CARE_PROVIDER_SITE_OTHER): Payer: BC Managed Care – PPO | Admitting: Family Medicine

## 2011-04-04 VITALS — BP 127/78 | HR 93 | Temp 98.7°F | Wt 201.0 lb

## 2011-04-04 DIAGNOSIS — E039 Hypothyroidism, unspecified: Secondary | ICD-10-CM

## 2011-04-04 DIAGNOSIS — J45909 Unspecified asthma, uncomplicated: Secondary | ICD-10-CM

## 2011-04-04 DIAGNOSIS — F339 Major depressive disorder, recurrent, unspecified: Secondary | ICD-10-CM

## 2011-04-04 DIAGNOSIS — E785 Hyperlipidemia, unspecified: Secondary | ICD-10-CM

## 2011-04-04 DIAGNOSIS — I10 Essential (primary) hypertension: Secondary | ICD-10-CM

## 2011-04-04 MED ORDER — VENLAFAXINE HCL ER 37.5 MG PO CP24: 75.0000 mg | ORAL_CAPSULE | Freq: Every day | ORAL | Status: DC

## 2011-04-04 MED ORDER — ALBUTEROL 90 MCG/ACT IN AERS: 2.0000 | INHALATION_SPRAY | RESPIRATORY_TRACT | Status: DC | PRN

## 2011-04-04 MED ORDER — LISINOPRIL-HYDROCHLOROTHIAZIDE 20-25 MG PO TABS: 1.0000 | ORAL_TABLET | Freq: Every day | ORAL | Status: DC

## 2011-04-04 MED ORDER — LORAZEPAM 0.5 MG PO TABS: 0.5000 mg | ORAL_TABLET | Freq: Two times a day (BID) | ORAL | Status: AC | PRN

## 2011-04-04 NOTE — Progress Notes (Signed)
  Subjective:    Patient ID: Kimberly Waters, female    DOB: Mar 05, 1946, 65 y.o.   MRN: [id number]  HPI 1. Hypertension Blood pressure at home: dos not check Blood pressure today: 127/78 Taking Meds:yes lisinopril/HCTZ Side effects:no ROS: Denies headache visual changes nausea, vomiting, chest pain or abdominal pain or shortness of breath.  Cholesterol-  Has been three years since we checked been taking simvistatin nightly with no side effects.   Depression-  Pt has been on Impipramine for some time now and feels it is not helping much.  Pt has been dealing with her father who is on Hospice and is actively dying of renal failure. Pt states she has been crying much, more eating more and bad food, not taking time to care for herself, and not sleeping well.  Pt denies  Suicidal and Homicidal ideation.  Pt would like to know what else she can do, feels overwhelmed at times.   Thyroid-  Has not have it checked for over a year.  Pt states she has had considerable weight gain but has not been exercising and eating out 2-3 meals daily with her parents.  Pt denies hair loss swelling  Pt is seeing someone for shots due to macular degeneration.   Review of Systems ROS Denies fever, chills, nausea vomiting abdominal pain, dysuria, chest pain, shortness of breath dyspnea on exertion or numbness in extremities     Objective:   Physical Exam General Appearance:    Alert, cooperative, no distress, appears stated age  Head:    Normocephalic, without obvious abnormality, atraumatic  Eyes:    PERRL, conjunctiva/corneas clear, EOM's intact,   Ears:    Normal TM's and external ear canals, both ears  Nose:   Nares normal, septum midline, mucosa normal, no drainage    or sinus tenderness  Throat:   Lips, mucosa, and tongue normal; teeth and gums normal  Neck:   Supple, symmetrical, trachea midline, no adenopathy;    thyroid: mild goiter no nodules; no carotid  bruit or JVD  Lungs:     Clear to auscultation  bilaterally, respirations unlabored  Chest Wall:    No tenderness or deformity   Heart:    Regular rate and rhythm, S1 and S2 normal, no murmur, rub   or gallop     Abdomen:     Soft, non-tender, bowel sounds active all four quadrants,    no masses, no organomegaly  Extremities:   Extremities normal, atraumatic, no cyanosis or edema  Pulses:   2+ and symmetric all extremities  Skin:   Skin color, texture, turgor normal, no rashes or lesions      Assessment & Plan:

## 2011-04-04 NOTE — Patient Instructions (Signed)
It is so nice to meet you! I am so sorry to hear about your father but I can tell you are strong and will get through this.  Do not forget to take care of you! I am changing some of your medicine I want you to decrease your Impiramine to 50mg  nightly for the next week then discontinue it.  I am giving you a new medication called effexor.  Take 1 pill daily for the next week then increase to 2 pills daily I am giving you a little Ativan to help you with the anxiety.  Use one tab only as needed up to twice a day.  Do not mix this medicine with alcohol I want to see you again in 1 month.

## 2011-04-04 NOTE — Assessment & Plan Note (Signed)
Will get labs, at goal will refill lisinopril.

## 2011-04-04 NOTE — Assessment & Plan Note (Signed)
Will get TSH today titrate as needed.

## 2011-04-04 NOTE — Assessment & Plan Note (Signed)
Pt does not appear to be doing well and is dealing with a very difficult decision. Pt denies  Suicidal and Homicidal ideation  At this time told her if anything changes please give me a call.  Will titrate down on her medication and will start effexor will help her with a little more energy and will help with the weight gain. Will also give ativan for bridge and hopefully will only need be needed for 1-2 months. Will see again in 1 month.

## 2011-04-04 NOTE — Assessment & Plan Note (Addendum)
Will get direct LDL today.

## 2011-04-05 LAB — COMPREHENSIVE METABOLIC PANEL
ALT: 22 U/L (ref 0–35)
AST: 13 U/L (ref 0–37)
Albumin: 4.4 g/dL (ref 3.5–5.2)
Alkaline Phosphatase: 57 U/L (ref 39–117)
Calcium: 9.6 mg/dL (ref 8.4–10.5)
Chloride: 100 mEq/L (ref 96–112)
Potassium: 3.9 mEq/L (ref 3.5–5.3)
Sodium: 138 mEq/L (ref 135–145)
Total Protein: 7.2 g/dL (ref 6.0–8.3)

## 2011-04-05 MED ORDER — LEVOTHYROXINE SODIUM 112 MCG PO TABS: 112.0000 ug | ORAL_TABLET | Freq: Every day | ORAL | Status: DC

## 2011-04-05 NOTE — Progress Notes (Signed)
  Subjective:    Patient ID: Kimberly Waters, female    DOB: [birth year], 65 y.o.   MRN: [id number]  HPI  Pt TSh is elevated so will increase levothyroxine to 112 and recheck in 3 months.   Review of Systems     Objective:   Physical Exam        Assessment & Plan:

## 2011-04-05 NOTE — Progress Notes (Signed)
Addended by: Antoine Primas on: 04/05/2011 11:12 AM   Modules accepted: Orders

## 2011-04-28 ENCOUNTER — Ambulatory Visit (INDEPENDENT_AMBULATORY_CARE_PROVIDER_SITE_OTHER): Payer: BC Managed Care – PPO | Admitting: Family Medicine

## 2011-04-28 ENCOUNTER — Encounter: Payer: Self-pay | Admitting: Family Medicine

## 2011-04-28 VITALS — BP 114/71 | HR 98 | Temp 98.6°F | Wt 192.6 lb

## 2011-04-28 DIAGNOSIS — F339 Major depressive disorder, recurrent, unspecified: Secondary | ICD-10-CM

## 2011-04-28 DIAGNOSIS — R1013 Epigastric pain: Secondary | ICD-10-CM

## 2011-04-28 MED ORDER — ONDANSETRON 4 MG PO TBDP: 4.0000 mg | ORAL_TABLET | Freq: Three times a day (TID) | ORAL | Status: AC | PRN

## 2011-04-28 MED ORDER — IMIPRAMINE HCL 50 MG PO TABS: 50.0000 mg | ORAL_TABLET | Freq: Every day | ORAL | Status: DC

## 2011-04-28 NOTE — Assessment & Plan Note (Signed)
D/c effexor and continue imipramine.  Pt. Instructed on proper technique.

## 2011-04-28 NOTE — Patient Instructions (Signed)
Abdominal Pain Abdominal pain can be caused by many things. Your caregiver decides the seriousness of your pain by an examination and possibly blood tests and X-rays. Many cases can be observed and treated at home. Most abdominal pain is not caused by a disease and will probably improve without treatment. However, in many cases, more time must pass before a clear cause of the pain can be found. Before that point, it may not be known if you need more testing, or if hospitalization or surgery is needed. HOME CARE INSTRUCTIONS  Do not take laxatives unless directed by your caregiver.   Take pain medicine only as directed by your caregiver.   Only take over-the-counter or prescription medicines for pain, discomfort, or fever as directed by your caregiver.   Try a clear liquid diet (broth, tea, or water) for nausea or as ordered by your caregiver. Slowly move to a bland diet as tolerated.  SEEK IMMEDIATE MEDICAL CARE IF:  The pain does not go away.   You or your child has an oral temperature above 101, not controlled by medicine.   You keep throwing up (vomiting).   The pain is felt only in portions of the abdomen. Pain in the right side could possibly be appendicitis. In an adult, pain in the left lower portion of the abdomen could be colitis or diverticulitis.   You pass bloody or black tarry stools.  MAKE SURE YOU:  Understand these instructions.   Will watch your condition.   Will get help right away if you are not doing well or get worse.  Document Released: 09/20/2005 Document Re-Released: 03/07/2010 [facility] Patient Information 2011 [place], [place].

## 2011-04-28 NOTE — Progress Notes (Signed)
  Subjective:    Patient ID: Kimberly Waters, female    DOB: 1946/03/05, 64 y.o.   MRN: [id number]  HPI Comments: Symptoms started when she changed from imipramine to effexor.  She is not taking with food.  She had a slow taper on and pain has been present since.  Had one bad episode with emesis after french fries.  She still has a gallbladder.  Effexor has not improved her mood.  She is busy and under a lot of stress at present.  Abdominal Pain This is a new problem. The current episode started 1 to 4 weeks ago. The onset quality is sudden. The problem occurs constantly. The problem has been gradually worsening. The pain is located in the epigastric region. The pain is moderate. The quality of the pain is aching, colicky, cramping and a sensation of fullness. The abdominal pain does not radiate. Associated symptoms include nausea and vomiting. Pertinent negatives include no dysuria or fever. The pain is aggravated by eating. The pain is relieved by nothing. She has tried proton pump inhibitors for the symptoms. The treatment provided moderate relief.      Review of Systems  Constitutional: Positive for appetite change (diminished) and unexpected weight change (decreased). Negative for fever and chills.  Gastrointestinal: Positive for nausea, vomiting, abdominal pain and abdominal distention.  Genitourinary: Negative for dysuria and flank pain.  Neurological: Negative for dizziness.       Objective:   Physical Exam  Constitutional: She appears well-developed and well-nourished.  HENT:  Head: Normocephalic and atraumatic.  Eyes: Pupils are equal, round, and reactive to light.  Cardiovascular: Normal rate.   Pulmonary/Chest: Effort normal.  Abdominal: Soft. There is tenderness (epigastric). There is no rebound and no guarding.          Assessment & Plan:

## 2011-05-01 ENCOUNTER — Telehealth: Payer: Self-pay | Admitting: *Deleted

## 2011-05-01 NOTE — Telephone Encounter (Signed)
LMOVM for patient for callback.  Will inform of appt @ [facility] May 05, 2011 @ 8:30am.  She cant have anything to eat or drink after midnight the night before.  Will await for callback

## 2011-05-02 NOTE — Telephone Encounter (Signed)
Able to reach patient at her cell phone number found in the chart.  When I called her she was unable to write down info, but asked that I call her house and leave it on her VM.  Left below info on her voicemail.

## 2011-05-03 ENCOUNTER — Telehealth: Payer: Self-pay | Admitting: Family Medicine

## 2011-05-03 ENCOUNTER — Ambulatory Visit: Payer: BC Managed Care – PPO | Admitting: Family Medicine

## 2011-05-03 NOTE — Telephone Encounter (Signed)
Opened in error

## 2011-05-05 ENCOUNTER — Ambulatory Visit (HOSPITAL_COMMUNITY)
Admission: RE | Admit: 2011-05-05 | Discharge: 2011-05-05 | Disposition: A | Discharge status: Home / Self Care | Payer: BC Managed Care – PPO | Source: Ambulatory Visit | Attending: Family Medicine | Admitting: Family Medicine

## 2011-05-05 DIAGNOSIS — K7689 Other specified diseases of liver: Secondary | ICD-10-CM | POA: Insufficient documentation

## 2011-05-05 DIAGNOSIS — R1013 Epigastric pain: Secondary | ICD-10-CM

## 2011-05-12 NOTE — Op Note (Signed)
NAME:  Kimberly Waters, Kimberly Waters                           ACCOUNT NO.:  [id number]   MEDICAL RECORD NO.:  [id number]                   PATIENT TYPE:  AMB   LOCATION:  ENDO                                 FACILITY:  [facility]   PHYSICIAN:  Anselmo Rod, M.D.               DATE OF BIRTH:  01/09/1946   DATE OF PROCEDURE:  04/01/2004  DATE OF DISCHARGE:                                 OPERATIVE REPORT   PROCEDURE:  Colonoscopy with snare polypectomy x 1 and core biopsies x 4.   ENDOSCOPIST:  Charna Elizabeth, M.D.   INSTRUMENT USED:  Olympus video colonoscope.   INDICATIONS FOR PROCEDURE:  65 year old white female with a family history  of colon cancer, personal history of rectal bleeding, rule out colonic  polyps, masses, etc.   PREPROCEDURE PREPARATION:  Informed consent was procured from the patient.  The patient was fasted for eight hours prior to the procedure and prepped  with a bottle of magnesium citrate and a gallon of GoLYTELY the night prior  to the procedure.   PREPROCEDURE PHYSICAL:  Patient with stable vital signs.  Neck supple.  Chest clear to auscultation.  S1 and S2 regular.  Abdomen soft with normal  bowel sounds.   DESCRIPTION OF PROCEDURE:  The patient was placed in the left lateral  decubitus position, sedated with 125 mg of Demerol and 12.5 mg Versed  intravenously in slow incremental doses.  Once the patient was adequately  sedated, maintained on low flow oxygen and continuous cardiac monitoring,  the Olympus video colonoscope was advanced into the rectum to the cecum.  The appendiceal orifice and ileocecal valve were clearly visualized and  photographed.  The patient had a somewhat difficult exam with abdominal  discomfort when air was insufflated into the colon indicating visceral  hypersensitivity.  A flat, sessile polyp was snared from the proximal right  colon.  Two small sessile polyps were biopsied from the rectosigmoid area.  Small internal hemorrhoids were seen on  retroflexion.  There was no evidence  of diverticulosis as previously mentioned by the patient.  The patient  tolerated the procedure well without immediate complications.   IMPRESSION:  1. Colonic polyps removed, one by snare polypectomy and two by cold biopsy     forceps (see description above).  2. No evidence of diverticulosis.  3. Small internal hemorrhoids.  4. Visceral hypersensitivity indicated by abdominal discomfort with     insufflation of air into the colon indicating component of IBS.   RECOMMENDATIONS:  1. Await pathology results.  2. High fiber diet with liberal fluid intake.  3. Avoid nonsteroidals for the next four weeks.  4. Outpatient follow up in the next two weeks for further recommendations.  Anselmo Rod, M.D.    JNM/MEDQ  D:  04/01/2004  T:  04/01/2004  Job:  [id number]   cc:   Nani Gasser, M.D.  [address] [place], [place] [address]  Fax: [phone]

## 2011-05-19 ENCOUNTER — Encounter: Payer: Self-pay | Admitting: Family Medicine

## 2011-05-19 ENCOUNTER — Ambulatory Visit (INDEPENDENT_AMBULATORY_CARE_PROVIDER_SITE_OTHER): Payer: BC Managed Care – PPO | Admitting: Family Medicine

## 2011-05-19 VITALS — BP 131/75 | HR 91 | Temp 98.6°F | Wt 194.0 lb

## 2011-05-19 DIAGNOSIS — I10 Essential (primary) hypertension: Secondary | ICD-10-CM

## 2011-05-19 DIAGNOSIS — F339 Major depressive disorder, recurrent, unspecified: Secondary | ICD-10-CM

## 2011-05-19 DIAGNOSIS — R12 Heartburn: Secondary | ICD-10-CM

## 2011-05-19 MED ORDER — PANTOPRAZOLE SODIUM 20 MG PO TBEC: 20.0000 mg | DELAYED_RELEASE_TABLET | Freq: Every day | ORAL | Status: DC

## 2011-05-19 MED ORDER — IMIPRAMINE HCL 50 MG PO TABS: 200.0000 mg | ORAL_TABLET | Freq: Every day | ORAL | Status: DC

## 2011-05-19 NOTE — Progress Notes (Signed)
  Subjective:    Patient ID: Kimberly Waters, female    DOB: [birth year], 65 y.o.   MRN: [id number]  Abdominal Pain   1. Hypertension Blood pressure at home: dos not check Blood pressure today: 131/75 Taking Meds:yes lisinopril/HCTZ Side effects:no ROS: Denies headache visual changes nausea, vomiting, chest pain or abdominal pain or shortness of breath.    Depression-  Pt changes back to imipamine, doing better but still feels overwhelmed by everything.  Pt has a lot of stress in her life being the primary caretaker for both of her ill parents and her daughter is making poor choices in her life and that she is the only one who takes care of her house. Pt is sleeping a little better, but still wakes up in the night, did not care for the effexor.    Pt is complaining of abdominal pain-  Mid epigastric, non radiating, states it hurts more if she does not eat and when she does eat she seems to have a lot of gas in the area.  Denies changes in bowel color or consistency. Denies dysuria.  Pt is followed closely by GI due to having multiple polyps on last colonoscopy.   Review of Systems  Gastrointestinal: Positive for abdominal pain.   Review of Systems  Gastrointestinal: Positive for abdominal pain.   Denies fever, chills, nausea vomiting a dysuria, chest pain, shortness of breath dyspnea on exertion or numbness in extremities     Objective:   Physical Exam General Appearance:    Alert, cooperative, no distress, appears stated age  Head:    Normocephalic, without obvious abnormality, atraumatic  Throat:   Lips, mucosa, and tongue normal; teeth and gums normal  Neck:   Supple, symmetrical, trachea midline, no adenopathy;    thyroid: mild goiter no nodules; no carotid  bruit or JVD  Lungs:     Clear to auscultation bilaterally, respirations unlabored  Chest Wall:    No tenderness or deformity   Heart:    Regular rate and rhythm, S1 and S2 normal, no murmur, rub   or gallop  Abdomen:      Soft, non-tender, bowel sounds active all four quadrants,    no masses, no organomegaly  Extremities:   Extremities normal, atraumatic, no cyanosis or edema  Pulses:   2+ and symmetric all extremities  Skin:   Skin color, texture, turgor normal, no rashes or lesions    Assessment & Plan:

## 2011-05-19 NOTE — Assessment & Plan Note (Addendum)
Pt denies  Suicidal and Homicidal ideation  Pt doing fine could improve, will increase to 200mg  every night, warned of potential side effects, continue benzos prn, pt not abusing them at all, doing better, gave some coping mechanisms including breathing techniques.  Will RTC in 2 months.

## 2011-05-19 NOTE — Assessment & Plan Note (Signed)
At goal. Continue current regimen. 

## 2011-05-19 NOTE — Patient Instructions (Signed)
Try to delegate some responsibilities and make sure you take time for yourself.  You are doing very well with everything going on. I want you to increase your tofranil to 200mg  at night We will try Protonix for your stomach.  Take 2 pills daily at first for the first week then go to 1 pill daily I want to see you again in 2-3 months make sure you are doing well.

## 2011-05-19 NOTE — Assessment & Plan Note (Signed)
Change to protonix and will do high dose for the next 7 days then back to 20mg  to see how pt does, gave red flags to look out for warned of bone demineralization as well.

## 2011-08-24 ENCOUNTER — Other Ambulatory Visit: Payer: Self-pay | Admitting: Family Medicine

## 2011-08-24 NOTE — Telephone Encounter (Signed)
Refill request

## 2011-11-18 ENCOUNTER — Other Ambulatory Visit: Payer: Self-pay | Admitting: Family Medicine

## 2011-11-19 NOTE — Telephone Encounter (Signed)
Refill request

## 2012-04-16 ENCOUNTER — Ambulatory Visit (INDEPENDENT_AMBULATORY_CARE_PROVIDER_SITE_OTHER): Payer: BC Managed Care – PPO | Admitting: Family Medicine

## 2012-04-16 ENCOUNTER — Other Ambulatory Visit: Payer: Self-pay | Admitting: Family Medicine

## 2012-04-16 ENCOUNTER — Encounter: Payer: Self-pay | Admitting: Family Medicine

## 2012-04-16 VITALS — BP 125/78 | HR 102 | Temp 97.9°F | Ht 62.0 in | Wt 190.0 lb

## 2012-04-16 DIAGNOSIS — E039 Hypothyroidism, unspecified: Secondary | ICD-10-CM

## 2012-04-16 DIAGNOSIS — E559 Vitamin D deficiency, unspecified: Secondary | ICD-10-CM | POA: Insufficient documentation

## 2012-04-16 DIAGNOSIS — E781 Pure hyperglyceridemia: Secondary | ICD-10-CM

## 2012-04-16 DIAGNOSIS — Z0000 Encounter for general adult medical examination without abnormal findings: Secondary | ICD-10-CM

## 2012-04-16 DIAGNOSIS — E785 Hyperlipidemia, unspecified: Secondary | ICD-10-CM

## 2012-04-16 DIAGNOSIS — F339 Major depressive disorder, recurrent, unspecified: Secondary | ICD-10-CM

## 2012-04-16 DIAGNOSIS — I10 Essential (primary) hypertension: Secondary | ICD-10-CM

## 2012-04-16 LAB — CBC WITH DIFFERENTIAL/PLATELET
Basophils Relative: 0 % (ref 0–1)
Hemoglobin: 14.5 g/dL (ref 12.0–15.0)
Lymphocytes Relative: 35 % (ref 12–46)
MCHC: 33.1 g/dL (ref 30.0–36.0)
Monocytes Relative: 5 % (ref 3–12)
Neutro Abs: 4.3 10*3/uL (ref 1.7–7.7)
Neutrophils Relative %: 60 % (ref 43–77)
RBC: 4.75 MIL/uL (ref 3.87–5.11)
WBC: 7.2 10*3/uL (ref 4.0–10.5)

## 2012-04-16 LAB — TSH: TSH: 2.324 u[IU]/mL (ref 0.350–4.500)

## 2012-04-16 LAB — COMPREHENSIVE METABOLIC PANEL
Alkaline Phosphatase: 65 U/L (ref 39–117)
BUN: 18 mg/dL (ref 6–23)
CO2: 23 mEq/L (ref 19–32)
Creat: 0.91 mg/dL (ref 0.50–1.10)
Glucose, Bld: 93 mg/dL (ref 70–99)
Total Bilirubin: 0.4 mg/dL (ref 0.3–1.2)
Total Protein: 7.7 g/dL (ref 6.0–8.3)

## 2012-04-16 LAB — LDL CHOLESTEROL, DIRECT: Direct LDL: 107 mg/dL — ABNORMAL HIGH

## 2012-04-16 MED ORDER — SIMVASTATIN 20 MG PO TABS: 20.0000 mg | ORAL_TABLET | Freq: Every day | ORAL | Status: DC

## 2012-04-16 MED ORDER — FLUTICASONE PROPIONATE 50 MCG/ACT NA SUSP: 1.0000 | Freq: Two times a day (BID) | NASAL | Status: DC

## 2012-04-16 MED ORDER — LEVOTHYROXINE SODIUM 112 MCG PO TABS: 112.0000 ug | ORAL_TABLET | Freq: Every day | ORAL | Status: DC

## 2012-04-16 MED ORDER — PANTOPRAZOLE SODIUM 20 MG PO TBEC: 20.0000 mg | DELAYED_RELEASE_TABLET | Freq: Every day | ORAL | Status: DC

## 2012-04-16 MED ORDER — LISINOPRIL-HYDROCHLOROTHIAZIDE 20-25 MG PO TABS: 1.0000 | ORAL_TABLET | Freq: Every day | ORAL | Status: DC

## 2012-04-16 NOTE — Assessment & Plan Note (Signed)
We'll check direct LDL today.

## 2012-04-16 NOTE — Assessment & Plan Note (Signed)
Seems to have resolved and patient is coping well with the death of her father. Discussed with patient knows of red flags and when to seek medical attention. Patient did seem to improve on Effexor last time.

## 2012-04-16 NOTE — Progress Notes (Signed)
  Subjective:    Patient ID: Kimberly Waters, female    DOB: 20-Sep-1946, 66 y.o.   MRN: [id number]  Abdominal Pain   1. Hypertension Blood pressure at home: dos not check Blood pressure today: 125/78 Taking Meds:yes lisinopril/HCTZ Side effects:no ROS: Denies headache visual changes nausea, vomiting, chest pain or abdominal pain or shortness of breath.   Depression- patient has not been taking any medications at this time. Patient stopped after she needed a refill of her Effexor. Patient states she's feeling much better does not need any medications, denies any suicidal or homicidal ideation. Patient did lose her father in October which was difficult but overall coping well.   Hypothyroidism-patient has not had TSH checked in some time. Patient denies much fatigue and denies any swelling or any hair loss. Patient has been taking her medication regularly and feels well controlled. Cholesterol has not been checked for some time.  Review of Systems ROS Denies fever, chills, nausea vomiting a dysuria, chest pain, shortness of breath dyspnea on exertion or numbness in extremities     Objective:   Physical Exam vitals reviewed General Appearance:    Alert, cooperative, no distress, appears stated age  Head:    Normocephalic, without obvious abnormality, atraumatic  Throat:   Lips, mucosa, and tongue normal; teeth and gums normal  Neck:   Supple, symmetrical, trachea midline, no adenopathy;    thyroid: mild goiter no nodules; no carotid  bruit or JVD  Lungs:     Clear to auscultation bilaterally, respirations unlabored  Chest Wall:    No tenderness or deformity   Heart:    Regular rate and rhythm, S1 and S2 normal, no murmur, rub   or gallop  Abdomen:     Soft, non-tender, bowel sounds active all four quadrants,    no masses, no organomegaly  Extremities:   Extremities normal, atraumatic, no cyanosis or edema  Pulses:   2+ and symmetric all extremities  Skin:   Skin color, texture, turgor  normal, no rashes or lesions    Assessment & Plan:

## 2012-04-16 NOTE — Assessment & Plan Note (Signed)
Check TSH today augment care if appropriate.

## 2012-04-16 NOTE — Telephone Encounter (Signed)
Forgot to tell Dr Katrinka Blazing that she needed to get a script for Albuterol and imipramine - is asking for [place] to mail them to her.

## 2012-04-16 NOTE — Patient Instructions (Signed)
It is great to see you. You are due for her mammogram and colonoscopy. I will get some labs on you today and I will call you with the results. I have refilled all your medications. If you need anything else you know where I am for the next couple months.

## 2012-04-16 NOTE — Assessment & Plan Note (Signed)
Seems to be at goal, we will check a basic metabolic panel make sure renal function is normal. Continue same medications.

## 2012-04-17 LAB — VITAMIN D 25 HYDROXY (VIT D DEFICIENCY, FRACTURES): Vit D, 25-Hydroxy: 29 ng/mL — ABNORMAL LOW (ref 30–89)

## 2012-04-17 MED ORDER — IMIPRAMINE HCL 50 MG PO TABS: 200.0000 mg | ORAL_TABLET | Freq: Every day | ORAL | Status: DC

## 2012-04-17 MED ORDER — ALBUTEROL SULFATE HFA 108 (90 BASE) MCG/ACT IN AERS: 2.0000 | INHALATION_SPRAY | Freq: Four times a day (QID) | RESPIRATORY_TRACT | Status: DC | PRN

## 2012-04-17 NOTE — Telephone Encounter (Signed)
Done, sent them into [facility] today.

## 2012-04-27 ENCOUNTER — Other Ambulatory Visit: Payer: Self-pay | Admitting: Family Medicine

## 2012-07-12 ENCOUNTER — Encounter: Payer: Self-pay | Admitting: *Deleted

## 2012-07-24 ENCOUNTER — Other Ambulatory Visit: Payer: Self-pay | Admitting: Family Medicine

## 2012-09-30 ENCOUNTER — Other Ambulatory Visit: Payer: Self-pay | Admitting: *Deleted

## 2012-09-30 DIAGNOSIS — E039 Hypothyroidism, unspecified: Secondary | ICD-10-CM

## 2012-10-01 MED ORDER — LEVOTHYROXINE SODIUM 112 MCG PO TABS: 112.0000 ug | ORAL_TABLET | Freq: Every day | ORAL | Status: DC

## 2012-10-01 MED ORDER — LISINOPRIL-HYDROCHLOROTHIAZIDE 20-25 MG PO TABS: 1.0000 | ORAL_TABLET | Freq: Every day | ORAL | Status: DC

## 2012-10-01 NOTE — Telephone Encounter (Signed)
Refilled medications, synthroid and lisinopril-HCTZ.  Patient should plan to schedule follow-up for her blood pressure in the next month before anymore refills will be granted.

## 2012-10-02 MED ORDER — LEVOTHYROXINE SODIUM 112 MCG PO TABS: 112.0000 ug | ORAL_TABLET | Freq: Every day | ORAL | Status: DC

## 2012-10-02 MED ORDER — LISINOPRIL-HYDROCHLOROTHIAZIDE 20-25 MG PO TABS: 1.0000 | ORAL_TABLET | Freq: Every day | ORAL | Status: DC

## 2012-10-02 NOTE — Addendum Note (Signed)
Addended by: Damita Lack on: 10/02/2012 08:17 AM   Modules accepted: Orders

## 2012-10-04 ENCOUNTER — Other Ambulatory Visit: Payer: Self-pay | Admitting: Orthopedic Surgery

## 2012-10-04 DIAGNOSIS — M25512 Pain in left shoulder: Secondary | ICD-10-CM

## 2012-10-07 ENCOUNTER — Ambulatory Visit
Admission: RE | Admit: 2012-10-07 | Discharge: 2012-10-07 | Disposition: A | Discharge status: Home / Self Care | Payer: BC Managed Care – PPO | Source: Ambulatory Visit | Attending: Orthopedic Surgery | Admitting: Orthopedic Surgery

## 2012-10-07 ENCOUNTER — Telehealth: Payer: Self-pay | Admitting: Family Medicine

## 2012-10-07 DIAGNOSIS — M25512 Pain in left shoulder: Secondary | ICD-10-CM

## 2012-10-07 NOTE — Telephone Encounter (Signed)
Please send new rx for Simvastatin and Albuterol inhaler to [facility] on [address].  These are new rxs for them so they need to be sent asap

## 2012-10-09 MED ORDER — ALBUTEROL SULFATE HFA 108 (90 BASE) MCG/ACT IN AERS: 2.0000 | INHALATION_SPRAY | Freq: Four times a day (QID) | RESPIRATORY_TRACT | Status: DC | PRN

## 2012-10-09 MED ORDER — SIMVASTATIN 20 MG PO TABS: 20.0000 mg | ORAL_TABLET | Freq: Every day | ORAL | Status: DC

## 2012-10-09 NOTE — Telephone Encounter (Signed)
Prescriptions for simvastatin and albuterol refilled for patient. Patient will need to schedule follow-up appointment prior to more refills being placed.

## 2012-10-10 NOTE — Telephone Encounter (Signed)
Pt informed and agreeable. Karie Skowron Dawn  

## 2012-10-21 ENCOUNTER — Ambulatory Visit: Payer: BC Managed Care – PPO | Admitting: Family Medicine

## 2012-12-30 ENCOUNTER — Encounter: Payer: Self-pay | Admitting: Family Medicine

## 2012-12-30 ENCOUNTER — Ambulatory Visit (INDEPENDENT_AMBULATORY_CARE_PROVIDER_SITE_OTHER): Payer: 59 | Admitting: Family Medicine

## 2012-12-30 VITALS — BP 82/48 | HR 105 | Ht 61.0 in | Wt 191.0 lb

## 2012-12-30 DIAGNOSIS — A088 Other specified intestinal infections: Secondary | ICD-10-CM

## 2012-12-30 DIAGNOSIS — I959 Hypotension, unspecified: Secondary | ICD-10-CM

## 2012-12-30 DIAGNOSIS — J45901 Unspecified asthma with (acute) exacerbation: Secondary | ICD-10-CM | POA: Insufficient documentation

## 2012-12-30 DIAGNOSIS — R062 Wheezing: Secondary | ICD-10-CM

## 2012-12-30 DIAGNOSIS — K625 Hemorrhage of anus and rectum: Secondary | ICD-10-CM

## 2012-12-30 DIAGNOSIS — A084 Viral intestinal infection, unspecified: Secondary | ICD-10-CM | POA: Insufficient documentation

## 2012-12-30 DIAGNOSIS — E860 Dehydration: Secondary | ICD-10-CM | POA: Insufficient documentation

## 2012-12-30 DIAGNOSIS — R12 Heartburn: Secondary | ICD-10-CM

## 2012-12-30 LAB — POCT HEMOGLOBIN: Hemoglobin: 14 g/dL (ref 12.2–16.2)

## 2012-12-30 MED ORDER — PREDNISONE 20 MG PO TABS: 60.0000 mg | ORAL_TABLET | Freq: Every day | ORAL | Status: DC

## 2012-12-30 MED ORDER — METHYLPREDNISOLONE SODIUM SUCC 125 MG IJ SOLR
125.0000 mg | Freq: Once | INTRAMUSCULAR | Status: AC
  Administered 2012-12-30: 125 mg via INTRAMUSCULAR

## 2012-12-30 MED ORDER — IPRATROPIUM BROMIDE 0.02 % IN SOLN
0.5000 mg | Freq: Once | RESPIRATORY_TRACT | Status: AC
  Administered 2012-12-30: 0.5 mg via RESPIRATORY_TRACT

## 2012-12-30 MED ORDER — ALBUTEROL SULFATE (2.5 MG/3ML) 0.083% IN NEBU
2.5000 mg | INHALATION_SOLUTION | Freq: Once | RESPIRATORY_TRACT | Status: AC
  Administered 2012-12-30: 2.5 mg via RESPIRATORY_TRACT

## 2012-12-30 MED ORDER — OMEPRAZOLE 40 MG PO CPDR: 40.0000 mg | DELAYED_RELEASE_CAPSULE | Freq: Every day | ORAL | Status: DC

## 2012-12-30 NOTE — Assessment & Plan Note (Signed)
Solumedrol in clinic and duoneb Spacer given Albuterol Q4 for 48 hrs Presnisone 50mg  x 4 days starting tomorrow

## 2012-12-30 NOTE — Patient Instructions (Addendum)
Thank yuou for coming in today You are recovering from a severe viral gut infection You are now on the mend Stay well hydrated w/ water and gatorade Please start taking the increased Prilosec medicine for 14 days Please also start using your albuterol inhaler every 4 hours for the next 48 hours. Use the spacer with the inhaler  Viral Gastroenteritis Viral gastroenteritis is also known as stomach flu. This condition affects the stomach and intestinal tract. It can cause sudden diarrhea and vomiting. The illness typically lasts 3 to 8 days. Most people develop an immune response that eventually gets rid of the virus. While this natural response develops, the virus can make you quite ill. CAUSES  Many different viruses can cause gastroenteritis, such as rotavirus or noroviruses. You can catch one of these viruses by consuming contaminated food or water. You may also catch a virus by sharing utensils or other personal items with an infected person or by touching a contaminated surface. SYMPTOMS  The most common symptoms are diarrhea and vomiting. These problems can cause a severe loss of body fluids (dehydration) and a body salt (electrolyte) imbalance. Other symptoms may include:  Fever.  Headache.  Fatigue.  Abdominal pain. DIAGNOSIS  Your caregiver can usually diagnose viral gastroenteritis based on your symptoms and a physical exam. A stool sample may also be taken to test for the presence of viruses or other infections. TREATMENT  This illness typically goes away on its own. Treatments are aimed at rehydration. The most serious cases of viral gastroenteritis involve vomiting so severely that you are not able to keep fluids down. In these cases, fluids must be given through an intravenous line (IV). HOME CARE INSTRUCTIONS   Drink enough fluids to keep your urine clear or pale yellow. Drink small amounts of fluids frequently and increase the amounts as tolerated.  Ask your caregiver for  specific rehydration instructions.  Avoid:  Foods high in sugar.  Alcohol.  Carbonated drinks.  Tobacco.  Juice.  Caffeine drinks.  Extremely hot or cold fluids.  Fatty, greasy foods.  Too much intake of anything at one time.  Dairy products until 24 to 48 hours after diarrhea stops.  You may consume probiotics. Probiotics are active cultures of beneficial bacteria. They may lessen the amount and number of diarrheal stools in adults. Probiotics can be found in yogurt with active cultures and in supplements.  Wash your hands well to avoid spreading the virus.  Only take over-the-counter or prescription medicines for pain, discomfort, or fever as directed by your caregiver. Do not give aspirin to children. Antidiarrheal medicines are not recommended.  Ask your caregiver if you should continue to take your regular prescribed and over-the-counter medicines.  Keep all follow-up appointments as directed by your caregiver. SEEK IMMEDIATE MEDICAL CARE IF:   You are unable to keep fluids down.  You do not urinate at least once every 6 to 8 hours.  You develop shortness of breath.  You notice blood in your stool or vomit. This may look like coffee grounds.  You have abdominal pain that increases or is concentrated in one small area (localized).  You have persistent vomiting or diarrhea.  You have a fever.  The patient is a child younger than 3 months, and he or she has a fever.  The patient is a child older than 3 months, and he or she has a fever and persistent symptoms.  The patient is a child older than 3 months, and he or she  has a fever and symptoms suddenly get worse.  The patient is a baby, and he or she has no tears when crying. MAKE SURE YOU:   Understand these instructions.  Will watch your condition.  Will get help right away if you are not doing well or get worse. Document Released: 12/11/2005 Document Revised: 03/04/2012 Document Reviewed:  09/27/2011 [facility] Patient Information 2013 [place], [place].   Asthma, Acute Bronchospasm Your exam shows you have asthma, or acute bronchospasm that acts like asthma. Bronchospasm means your air passages become narrowed. These conditions are due to inflammation and airway spasm that cause narrowing of the bronchial tubes in the lungs. This causes you to have wheezing and shortness of breath. CAUSES  Respiratory infections and allergies most often bring on these attacks. Smoking, air pollution, cold air, emotional upsets, and vigorous exercise can also bring them on.  TREATMENT   Treatment is aimed at making the narrowed airways larger. Mild asthma/bronchospasm is usually controlled with inhaled medicines. Albuterol is a common medicine that you breathe in to open spastic or narrowed airways. Some trade names for albuterol are Ventolin or Proventil. Steroid medicine is also used to reduce the inflammation when an attack is moderate or severe. Antibiotics (medications used to kill germs) are only used if a bacterial infection is present.  If you are pregnant and need to use Albuterol (Ventolin or Proventil), you can expect the baby to move more than usual shortly after the medicine is used. HOME CARE INSTRUCTIONS   Rest.  Drink plenty of liquids. This helps the mucus to remain thin and easily coughed up. Do not use caffeine or alcohol.  Do not smoke. Avoid being exposed to second-hand smoke.  You play a critical role in keeping yourself in good health. Avoid exposure to things that cause you to wheeze. Avoid exposure to things that cause you to have breathing problems. Keep your medications up-to-date and available. Carefully follow your doctor's treatment plan.  When pollen or pollution is bad, keep windows closed and use an air conditioner go to places with air conditioning. If you are allergic to furry pets or birds, find new homes for them or keep them outside.  Take your medicine  exactly as prescribed.  Asthma requires careful medical attention. See your caregiver for follow-up as advised. If you are more than [redacted] weeks pregnant and you were prescribed any new medications, let your Obstetrician know about the visit and how you are doing. Arrange a recheck. SEEK IMMEDIATE MEDICAL CARE IF:   You are getting worse.  You have trouble breathing. If severe, call 911.  You develop chest pain or discomfort.  You are throwing up or not drinking fluids.  You are not getting better within 24 hours.  You are coughing up yellow, green, brown, or bloody sputum.  You develop a fever over 102 F (38.9 C).  You have trouble swallowing. MAKE SURE YOU:   Understand these instructions.  Will watch your condition.  Will get help right away if you are not doing well or get worse. Document Released: 03/28/2007 Document Revised: 03/04/2012 Document Reviewed: 11/25/2007 [facility] Patient Information 2013 [place], [place].

## 2012-12-30 NOTE — Assessment & Plan Note (Signed)
Pt given option to go to ED for hydration but chose to go home Able to keep several cups of water down and ambulate

## 2012-12-30 NOTE — Assessment & Plan Note (Addendum)
Current flare. Will increase prilosec to 40mg  QD for 14 days then return to 20mg  daily

## 2012-12-30 NOTE — Assessment & Plan Note (Signed)
Nadir yesterday Improving Supportive measures at home w/ rest and fluids Immodium if diarrhea continues

## 2012-12-31 ENCOUNTER — Encounter: Payer: Self-pay | Admitting: Family Medicine

## 2012-12-31 DIAGNOSIS — I959 Hypotension, unspecified: Secondary | ICD-10-CM | POA: Insufficient documentation

## 2012-12-31 NOTE — Progress Notes (Signed)
Kimberly Waters is a 67 y.o. female who presents to [facility] today for bleeding from bottom:  Bleeding from bottom: Started Saturday night w/ blood in toilet when felt like BM. Felt poorly.  Diarrhea. Syncope x1 after standing up from having diarrhea. No trauma to head when fell. Crampy abdominal pain during this time. Also associated w/ nausea. Only able to keep down a little ginger ail and dr. Reino Kent toast and crackers over the last couple of days. Denies fever, rash. Mother w/ identical sysmptoms day prior to her onset. No diarrhea today.  Asthma: h/o asthma. Admission to hospital overnight for asthma. Has not needed Albuterol until yesterday when pt needed frequent treatments (multiple per hour) pt concedes that she has anxiety which may have made this worse. Had to stop several times during interview ot catch breath.    The following portions of the patient's history were reviewed and updated as appropriate: allergies, current medications, past medical history, family and social history, and problem list.  Patient is a smoker  Past Medical History  Diagnosis Date  . Depression   . Hyperlipidemia   . Hypertension   . Thyroid disease   . Allergy     ROS as above otherwise neg.    Medications reviewed. Current Outpatient Prescriptions  Medication Sig Dispense Refill  . albuterol (PROVENTIL HFA;VENTOLIN HFA) 108 (90 BASE) MCG/ACT inhaler Inhale 2 puffs into the lungs every 6 (six) hours as needed for wheezing.  1 Inhaler  0  . albuterol (PROVENTIL,VENTOLIN) 90 MCG/ACT inhaler Inhale 2 puffs into the lungs every 4 (four) hours as needed.  17 g  3  . fluticasone (FLONASE) 50 MCG/ACT nasal spray Place 1 spray into the nose 2 (two) times daily.  16 g  1  . imipramine (TOFRANIL) 50 MG tablet Take 4 tablets (200 mg total) by mouth at bedtime.  360 tablet  3  . levothyroxine (SYNTHROID, LEVOTHROID) 112 MCG tablet Take 1 tablet (112 mcg total) by mouth daily.  90 tablet  1  .  lisinopril-hydrochlorothiazide (PRINZIDE,ZESTORETIC) 20-25 MG per tablet Take 1 tablet by mouth daily.  90 tablet  1  . omeprazole (PRILOSEC) 40 MG capsule Take 1 capsule (40 mg total) by mouth daily.  14 capsule  0  . pantoprazole (PROTONIX) 20 MG tablet Take 1 tablet (20 mg total) by mouth daily.  90 tablet  3  . predniSONE (DELTASONE) 20 MG tablet Take 3 tablets (60 mg total) by mouth daily.  12 tablet  0  . simvastatin (ZOCOR) 20 MG tablet Take 1 tablet (20 mg total) by mouth daily.  90 tablet  0    Exam: BP 82/48  Pulse 105  Ht 5\' 1"  (1.549 m)  Wt 191 lb (86.637 kg)  BMI 36.09 kg/m2  SpO2 95% Gen: Well NAD HEENT: EOMI,  MMM Lungs: wheezing and decreased breath sounds on inspiration and expiration.  Heart: RRR no MRG Abd: NABS, NT, ND Exts: Non edematous BL  LE, warm and well perfused.   Results for orders placed in visit on 12/30/12 (from the past 72 hour(s))  POCT HEMOGLOBIN     Status: Normal   Collection Time   12/30/12  2:41 PM      Component Value Range Comment   Hemoglobin 14.0  12.2 - 16.2 g/dL capillary sample

## 2012-12-31 NOTE — Assessment & Plan Note (Signed)
Likely secondary to dehydration and taking BP medications this am.  Able to ambulate w/o palpitations, SOB, Syncope Pt given option for ED for hydration but would like to go home and will rest and drink fluids as she can keep them down now

## 2013-01-06 ENCOUNTER — Other Ambulatory Visit: Payer: Self-pay | Admitting: Family Medicine

## 2013-01-06 NOTE — Telephone Encounter (Signed)
Refilled patients simvastatin. Will need to schedule an appointment in the next couple of months to discuss lipid levels and hypothyroidism.

## 2013-01-06 NOTE — Telephone Encounter (Signed)
Has appt on 1.28.14. Theophil Thivierge, Maryjo Rochester

## 2013-01-21 ENCOUNTER — Ambulatory Visit: Payer: 59 | Admitting: Family Medicine

## 2013-01-31 ENCOUNTER — Ambulatory Visit: Payer: 59 | Admitting: Family Medicine

## 2013-02-12 ENCOUNTER — Ambulatory Visit (INDEPENDENT_AMBULATORY_CARE_PROVIDER_SITE_OTHER): Payer: 59 | Admitting: Family Medicine

## 2013-02-12 ENCOUNTER — Encounter: Payer: Self-pay | Admitting: Family Medicine

## 2013-02-12 VITALS — BP 103/64 | HR 111 | Temp 98.3°F | Ht 61.0 in | Wt 191.0 lb

## 2013-02-12 DIAGNOSIS — J45909 Unspecified asthma, uncomplicated: Secondary | ICD-10-CM

## 2013-02-12 DIAGNOSIS — I10 Essential (primary) hypertension: Secondary | ICD-10-CM

## 2013-02-12 DIAGNOSIS — R12 Heartburn: Secondary | ICD-10-CM

## 2013-02-12 DIAGNOSIS — E785 Hyperlipidemia, unspecified: Secondary | ICD-10-CM

## 2013-02-12 DIAGNOSIS — F17200 Nicotine dependence, unspecified, uncomplicated: Secondary | ICD-10-CM

## 2013-02-12 DIAGNOSIS — E039 Hypothyroidism, unspecified: Secondary | ICD-10-CM

## 2013-02-12 LAB — COMPREHENSIVE METABOLIC PANEL
ALT: 24 U/L (ref 0–35)
AST: 15 U/L (ref 0–37)
CO2: 26 mEq/L (ref 19–32)
Creat: 1.08 mg/dL (ref 0.50–1.10)
Sodium: 134 mEq/L — ABNORMAL LOW (ref 135–145)
Total Bilirubin: 0.4 mg/dL (ref 0.3–1.2)
Total Protein: 7.3 g/dL (ref 6.0–8.3)

## 2013-02-12 LAB — TSH: TSH: 2.856 u[IU]/mL (ref 0.350–4.500)

## 2013-02-12 MED ORDER — ALBUTEROL SULFATE HFA 108 (90 BASE) MCG/ACT IN AERS: 2.0000 | INHALATION_SPRAY | Freq: Four times a day (QID) | RESPIRATORY_TRACT | Status: DC | PRN

## 2013-02-12 MED ORDER — SIMVASTATIN 20 MG PO TABS: 20.0000 mg | ORAL_TABLET | Freq: Every day | ORAL | Status: DC

## 2013-02-12 MED ORDER — LISINOPRIL-HYDROCHLOROTHIAZIDE 20-25 MG PO TABS: 1.0000 | ORAL_TABLET | Freq: Every day | ORAL | Status: DC

## 2013-02-12 MED ORDER — PANTOPRAZOLE SODIUM 20 MG PO TBEC: 20.0000 mg | DELAYED_RELEASE_TABLET | Freq: Every day | ORAL | Status: DC

## 2013-02-12 MED ORDER — ZOSTER VACCINE LIVE 19400 UNT/0.65ML ~~LOC~~ SOLR: 0.6500 mL | Freq: Once | SUBCUTANEOUS | Status: DC

## 2013-02-12 NOTE — Patient Instructions (Addendum)
Nice to meet you today. We will obtain some labs today. I will send you a letter with the results. I have refilled your medications. Thank you for your visit today.

## 2013-02-14 ENCOUNTER — Telehealth: Payer: Self-pay | Admitting: *Deleted

## 2013-02-14 MED ORDER — LEVOTHYROXINE SODIUM 112 MCG PO TABS: 112.0000 ug | ORAL_TABLET | Freq: Every day | ORAL | Status: DC

## 2013-02-14 NOTE — Assessment & Plan Note (Signed)
LDL of 120. Refilled simvastatin, will ask patient to increase dose to 40 mg daily.

## 2013-02-14 NOTE — Assessment & Plan Note (Signed)
Well controlled. At goal. Continue zestoretic at current dose 20-25 mg daily

## 2013-02-14 NOTE — Progress Notes (Signed)
  Subjective:    Patient ID: Kimberly Waters, female    DOB: 1946-05-21, 67 y.o.   MRN: [id number]  HPI Patient presents for yearly f/u for hypothyroid, hyperlipidemia, and HTN.  Hypothyroid: is taking synthroid. Denies palpitations. Denies symptoms of hypothyroidism. States feels good.  HLD: on simvastatin. Denies muscle aches and chest pain.  HTN: does not check BP at home. Denies CP, dyspnea, swelling, and HA. Takes zestoretic every day.  Soc Hx: patient is a smoker, 1/2-1 PPD, doesn't want to quit.  Review of Systems see HPI     Objective:   Physical Exam  Constitutional: She appears well-developed and well-nourished.  HENT:  Head: Normocephalic and atraumatic.  Cardiovascular: Normal rate, regular rhythm and normal heart sounds.  Exam reveals no gallop and no friction rub.   No murmur heard. Pulmonary/Chest: Effort normal and breath sounds normal. No respiratory distress. She has no wheezes. She has no rales.  Abdominal: Soft. She exhibits no distension. There is no tenderness. There is no rebound.  Neurological: She is alert.  Skin: Skin is warm and dry.  BP 103/64  Pulse 111  Temp(Src) 98.3 F (36.8 C) (Oral)  Ht 5\' 1"  (1.549 m)  Wt 191 lb (86.637 kg)  BMI 36.11 kg/m2     Assessment & Plan:

## 2013-02-14 NOTE — Assessment & Plan Note (Signed)
TSH 2.856. Will refill synthroid 112 mcg tablet.

## 2013-02-14 NOTE — Telephone Encounter (Signed)
Pt informed and agreeable. Kimberly Waters  

## 2013-02-14 NOTE — Telephone Encounter (Signed)
Message copied by Osborne Oman on Fri Feb 14, 2013  5:12 PM ------      Message from: Birdie Sons, ERIC G      Created: Fri Feb 14, 2013  3:48 PM       Please call patient to let her know her TSH was normal and I refilled her synthroid. Additionally please let her know that her LDL was 120 and I would like her to take 40 mg of simvastatin daily to help lower this level. Thanks. ------

## 2013-02-19 ENCOUNTER — Encounter: Payer: Self-pay | Admitting: Family Medicine

## 2013-05-13 ENCOUNTER — Telehealth: Payer: Self-pay | Admitting: Family Medicine

## 2013-05-13 DIAGNOSIS — F339 Major depressive disorder, recurrent, unspecified: Secondary | ICD-10-CM

## 2013-05-13 MED ORDER — IMIPRAMINE HCL 50 MG PO TABS: 200.0000 mg | ORAL_TABLET | Freq: Every day | ORAL | Status: DC

## 2013-05-13 NOTE — Telephone Encounter (Signed)
Unfortunately last refill I see was over one year ago, and this was not discussed at last visit.  The last mention of depression was by Dr.Smith in April of last year, and pt will not be

## 2013-05-13 NOTE — Telephone Encounter (Signed)
Pt is out of her me

## 2013-05-13 NOTE — Telephone Encounter (Signed)
MD paged, no response.  Called pt to clarify medication.  She has been on this medication for "years and have been out for 3 days".  Pt is upset that this has not been done.  She request paper Rxs in 90 day supply to be placed up front for her to pick up when MD is in clinic on Thursday.  Will forward to MD. Milas Gain, Maryjo Rochester

## 2013-05-13 NOTE — Telephone Encounter (Signed)
Pt is out of her imipramine. Rx order is in Dr Juluis Pitch box. She is getting very anxious about being out of her medicine for another day. Please advise

## 2013-08-05 ENCOUNTER — Other Ambulatory Visit: Payer: Self-pay | Admitting: *Deleted

## 2013-08-05 DIAGNOSIS — F339 Major depressive disorder, recurrent, unspecified: Secondary | ICD-10-CM

## 2013-08-06 MED ORDER — IMIPRAMINE HCL 50 MG PO TABS: 200.0000 mg | ORAL_TABLET | Freq: Every day | ORAL | Status: DC

## 2013-08-11 ENCOUNTER — Other Ambulatory Visit: Payer: Self-pay | Admitting: *Deleted

## 2013-08-11 DIAGNOSIS — F339 Major depressive disorder, recurrent, unspecified: Secondary | ICD-10-CM

## 2013-08-11 MED ORDER — IMIPRAMINE HCL 50 MG PO TABS: 200.0000 mg | ORAL_TABLET | Freq: Every day | ORAL | Status: DC

## 2013-08-11 NOTE — Telephone Encounter (Signed)
I have refilled this medication. Please advise she should be seen prior to further refills.

## 2013-09-25 ENCOUNTER — Other Ambulatory Visit: Payer: Self-pay | Admitting: Family Medicine

## 2013-09-30 ENCOUNTER — Other Ambulatory Visit: Payer: Self-pay | Admitting: Family Medicine

## 2013-12-17 ENCOUNTER — Encounter: Payer: Self-pay | Admitting: Family Medicine

## 2013-12-17 ENCOUNTER — Ambulatory Visit (INDEPENDENT_AMBULATORY_CARE_PROVIDER_SITE_OTHER): Payer: 59 | Admitting: Family Medicine

## 2013-12-17 VITALS — BP 135/59 | HR 102 | Temp 98.2°F | Ht 61.0 in | Wt 194.0 lb

## 2013-12-17 DIAGNOSIS — J019 Acute sinusitis, unspecified: Secondary | ICD-10-CM

## 2013-12-17 DIAGNOSIS — J069 Acute upper respiratory infection, unspecified: Secondary | ICD-10-CM

## 2013-12-17 DIAGNOSIS — J209 Acute bronchitis, unspecified: Secondary | ICD-10-CM

## 2013-12-17 MED ORDER — AMOXICILLIN-POT CLAVULANATE 875-125 MG PO TABS: 1.0000 | ORAL_TABLET | Freq: Two times a day (BID) | ORAL | Status: DC

## 2013-12-17 NOTE — Patient Instructions (Signed)
  Ms. Tourangeau, it was nice seeing you today.  You most likely have a viral sinusitis and acute bronchitis, both of which are usually caused by a virus.  Since you are a current smoker, it will take your body much longer to clear these infections.  I would start out with Neti Pot, nasal saline rinses, Nasacort OTC nasal steroid, Mucinex DM for the next 4-5 days to see if this improves along with ibuprofen.  You can start the Augmentin, take two pills a day for 10 days, in about 4-5 days if you have no improvement and we will see you back in 2 weeks if you are still not feeling well, otherwise we will see you as needed.  Thanks, Dr. Paulina Fusi

## 2013-12-17 NOTE — Progress Notes (Signed)
  Subjective:     Kimberly Waters is a 67 y.o. female who presents for evaluation of sinus pain. Symptoms include: congestion, cough, post nasal drip and sinus pressure. Onset of symptoms was 7 days ago. Symptoms have been unchanged since that time. Past history is significant for asthma and smoker @ 1 ppd for the last 20+ yrs.  She denies any fevers, chills, sweats, and has only tried Mucinex for her Sx.    The following portions of the patient's history were reviewed and updated as appropriate: allergies, current medications, past family history, past medical history, past social history, past surgical history and problem list.  Review of Systems Pertinent items are noted in HPI.   Objective:    BP 135/59  Pulse 102  Temp(Src) 98.2 F (36.8 C) (Oral)  Ht 5\' 1"  (1.549 m)  Wt 194 lb (87.998 kg)  BMI 36.67 kg/m2 Head: Normocephalic, without obvious abnormality, atraumatic Eyes: EOMI B/L, PERRLA, normal conjunctiva B/L Ears: normal TM's and external ear canals both ears Nose: sinus tenderness bilateral Throat: lips, mucosa, and tongue normal; teeth and gums normal Lungs: clear to auscultation bilaterally    Assessment:    Acute sinusitis.  Acute Bronchitis    Plan:    Nasal saline sprays. Neti pot recommended. Instructions given. Nasal steroids per medication orders. Follow up in 2 weeks or as needed.  Nasacort OTC  Mucinex DM for cough/congestion Augmentin given, but recommend holding off for 4-5 days if no improvement, as her smoking history will prolong the course.

## 2014-03-13 ENCOUNTER — Telehealth: Payer: Self-pay

## 2014-03-16 ENCOUNTER — Telehealth: Payer: Self-pay | Admitting: Family Medicine

## 2014-03-16 DIAGNOSIS — E785 Hyperlipidemia, unspecified: Secondary | ICD-10-CM

## 2014-03-16 NOTE — Telephone Encounter (Signed)
[phone] cell phone Pt would like refill on her simivastian. RX shows it was sent to pharmacy in Feb 2015 with 3 refills. [facility] [address] Please advise

## 2014-03-17 MED ORDER — SIMVASTATIN 20 MG PO TABS: 20.0000 mg | ORAL_TABLET | Freq: Every day | ORAL | Status: DC

## 2014-03-17 NOTE — Telephone Encounter (Signed)
LMOVM for pt to return call.   Please inform of message and schedule an appt. Kimberly Waters, [name]

## 2014-03-17 NOTE — Telephone Encounter (Signed)
Refill sent in. Please inform the patient she should come in for follow-up appointment some time in the next month.

## 2014-03-25 ENCOUNTER — Other Ambulatory Visit: Payer: Self-pay | Admitting: Family Medicine

## 2014-03-25 MED ORDER — LISINOPRIL-HYDROCHLOROTHIAZIDE 20-25 MG PO TABS
ORAL_TABLET | ORAL | Status: DC
Start: 2014-03-25 — End: 2017-04-02

## 2014-03-25 NOTE — Telephone Encounter (Signed)
Spoke with pt, unable to schedule until May 5.  Called and changed refill to two months so pt would have enough. Rhonda Linan, [name]  FYI: Husband scheduled appt as well. Kallum Jorgensen, [name]

## 2014-03-25 NOTE — Telephone Encounter (Signed)
One month of refills sent in. Please advise the patient that she needs a follow-up appointment some time in the next month.

## 2014-03-26 ENCOUNTER — Other Ambulatory Visit: Payer: Self-pay | Admitting: *Deleted

## 2014-03-27 MED ORDER — LEVOTHYROXINE SODIUM 112 MCG PO TABS: ORAL_TABLET | ORAL | Status: DC

## 2014-04-18 ENCOUNTER — Telehealth: Payer: Self-pay | Admitting: Family Medicine

## 2014-04-18 ENCOUNTER — Other Ambulatory Visit: Payer: Self-pay | Admitting: Family Medicine

## 2014-04-18 DIAGNOSIS — F339 Major depressive disorder, recurrent, unspecified: Secondary | ICD-10-CM

## 2014-04-18 MED ORDER — IMIPRAMINE HCL 50 MG PO TABS: 200.0000 mg | ORAL_TABLET | Freq: Every day | ORAL | Status: DC

## 2014-04-18 NOTE — Progress Notes (Signed)
Patient stated refill was not at pharmacy despite report on end of rx that it was confirmed by pharmacy at 11:49 AM today. I sent it again.

## 2014-04-18 NOTE — Telephone Encounter (Signed)
[facility] After Hours Line.   Requested refill of imipramine. States requested from pharmacy 3 days ago. Asked patient to ask 2 weeks in advance in future. High dose of medicine but has been stable so refilled.   Brayton Mars. Melanee Spry, MD, PGY-3 04/18/2014 11:50 AM

## 2014-04-28 ENCOUNTER — Ambulatory Visit: Payer: 59 | Admitting: Family Medicine

## 2014-06-03 ENCOUNTER — Telehealth: Payer: Self-pay | Admitting: Family Medicine

## 2014-06-17 NOTE — Telephone Encounter (Signed)
Left Message - Called to schedule IAWV. When pt returns call please schedule appt. for 60 minutes.   ° ° °

## 2014-08-04 ENCOUNTER — Other Ambulatory Visit: Payer: Self-pay | Admitting: Internal Medicine

## 2014-08-04 ENCOUNTER — Ambulatory Visit
Admission: RE | Admit: 2014-08-04 | Discharge: 2014-08-04 | Disposition: A | Discharge status: Home / Self Care | Payer: 59 | Source: Ambulatory Visit | Attending: Internal Medicine | Admitting: Internal Medicine

## 2014-08-04 DIAGNOSIS — F17200 Nicotine dependence, unspecified, uncomplicated: Secondary | ICD-10-CM

## 2015-08-10 ENCOUNTER — Other Ambulatory Visit: Payer: Self-pay | Admitting: Acute Care

## 2015-08-10 ENCOUNTER — Encounter: Payer: Self-pay | Admitting: Acute Care

## 2015-08-10 ENCOUNTER — Telehealth: Payer: Self-pay | Admitting: Acute Care

## 2015-08-10 DIAGNOSIS — F17210 Nicotine dependence, cigarettes, uncomplicated: Secondary | ICD-10-CM

## 2015-08-10 NOTE — Telephone Encounter (Signed)
I called Ms. Kimberly Waters at the referral of Dr. Mertha Finders. She is scheduled for a Lung Cancer screening Shared decision making visit and scan on 08/13/15 at 1 pm and 2 pm respectively. She verbalized understanding of time and location of both appointments and has my contact information in the event she needs to get in touch with me before then.

## 2015-08-13 ENCOUNTER — Encounter: Payer: Self-pay | Admitting: Acute Care

## 2015-08-13 ENCOUNTER — Inpatient Hospital Stay: Admission: RE | Admit: 2015-08-13 | Payer: Self-pay | Source: Ambulatory Visit

## 2015-08-25 ENCOUNTER — Ambulatory Visit (INDEPENDENT_AMBULATORY_CARE_PROVIDER_SITE_OTHER)
Admission: RE | Admit: 2015-08-25 | Discharge: 2015-08-25 | Disposition: A | Discharge status: Home / Self Care | Payer: Medicare Other | Source: Ambulatory Visit | Attending: Acute Care | Admitting: Acute Care

## 2015-08-25 ENCOUNTER — Telehealth: Payer: Self-pay | Admitting: Acute Care

## 2015-08-25 ENCOUNTER — Encounter: Payer: Self-pay | Admitting: Acute Care

## 2015-08-25 ENCOUNTER — Ambulatory Visit (INDEPENDENT_AMBULATORY_CARE_PROVIDER_SITE_OTHER): Payer: Medicare Other | Admitting: Acute Care

## 2015-08-25 DIAGNOSIS — F17210 Nicotine dependence, cigarettes, uncomplicated: Secondary | ICD-10-CM

## 2015-08-25 NOTE — Telephone Encounter (Signed)
I called Kimberly Waters to give her the results of her Screening scan. I explained that her scan was read as a Lung RAD's 2 which indicated nodules with a very low likelihood of becoming a clinically active cancer due to size or lack of growth. I explained that the recommendation was for a repeat scan in 12 months to make sure nothing has changed. She verbalized understanding of the results and had no further questions. I will fax the results to Dr. Mertha Finders office, as he is the referring MD.

## 2015-08-25 NOTE — Progress Notes (Signed)
Shared Decision Making Visit Lung Cancer Screening Program [phone])   Eligibility:  Age 69 y.o.  Pack Years Smoking History Calculation 45+ (# packs/per year x # years smoked)  Recent History of coughing up blood  no  Unexplained weight loss? no ( >Than 15 pounds within the last 6 months )  Prior History Lung / other cancer no (Diagnosis within the last 5 years already requiring surveillance chest CT Scans).  Smoking Status Current Smoker  Former Smokers: Years since quit:NA  Quit Date: NA  Visit Components:  Discussion included one or more decision making aids. yes  Discussion included risk/benefits of screening. yes  Discussion included potential follow up diagnostic testing for abnormal scans. yes  Discussion included meaning and risk of over diagnosis. yes  Discussion included meaning and risk of False Positives. yes  Discussion included meaning of total radiation exposure. yes  Counseling Included:  Importance of adherence to annual lung cancer LDCT screening. yes  Impact of comorbidities on ability to participate in the program. yes  Ability and willingness to under diagnostic treatment. yes  Smoking Cessation Counseling:  Current Smokers:   Discussed importance of smoking cessation. yes  Information about tobacco cessation classes and interventions provided to patient. yes  Patient provided with "ticket" for LDCT Scan. yes  Symptomatic Patient. No  Counseling  Diagnosis Code: Tobacco Use Z72.0  Asymptomatic Patient yes  Counseling (Intermediate counseling: > three minutes counseling) M2263  Former Smokers: NA: Current Smoker  Discussed the importance of maintaining cigarette abstinence.   Diagnosis Code: Personal History of Nicotine Dependence. F35.456  Information about tobacco cessation classes and interventions provided to patient. Yes  Patient provided with "ticket" for LDCT Scan. yes  Written Order for Lung Cancer Screening with  LDCT placed in Epic. Yes (CT Chest Lung Cancer Screening Low Dose W/O CM) [id number] Z12.2-Screening of respiratory organs Z87.891-Personal history of nicotine dependence  I spent 15 minutes of face to face time with [name] discussing the risks and benefits of the Lung Cancer Screening Program.She was a referral of Mertha Finders. We viewed a power point together that reviewed the key points noted above, pausing and stopping at intervals to ask and answer questions to ensure complete understanding. We discussed that the most powerful thing that she can do to decrease her risk of lung cancer is to quit smoking. I gave her the " Be Stronger than your excuses" card and told her to call me when she is ready to quit and that I would help her in any way I can. She verbalized understanding. Additionally I gave her a copy of the power point we viewed to refer to  In the future, and a copy of my card in the event she has any questions. She is aware of both the time and location of her CT scan, and had no further questions upon leaving the office. She verbalized understanding of all of the above.   Kimberly Spatz, NP

## 2016-02-02 ENCOUNTER — Encounter (INDEPENDENT_AMBULATORY_CARE_PROVIDER_SITE_OTHER): Payer: Medicare Other | Admitting: Ophthalmology

## 2016-02-02 DIAGNOSIS — H353111 Nonexudative age-related macular degeneration, right eye, early dry stage: Secondary | ICD-10-CM

## 2016-02-02 DIAGNOSIS — H35033 Hypertensive retinopathy, bilateral: Secondary | ICD-10-CM | POA: Diagnosis not present

## 2016-02-02 DIAGNOSIS — H2513 Age-related nuclear cataract, bilateral: Secondary | ICD-10-CM | POA: Diagnosis not present

## 2016-02-02 DIAGNOSIS — H43813 Vitreous degeneration, bilateral: Secondary | ICD-10-CM

## 2016-02-02 DIAGNOSIS — H353221 Exudative age-related macular degeneration, left eye, with active choroidal neovascularization: Secondary | ICD-10-CM | POA: Diagnosis not present

## 2016-02-02 DIAGNOSIS — I10 Essential (primary) hypertension: Secondary | ICD-10-CM | POA: Diagnosis not present

## 2016-02-17 ENCOUNTER — Telehealth: Payer: Self-pay | Admitting: *Deleted

## 2016-02-17 NOTE — Telephone Encounter (Signed)
Called patient to offer flu vaccine. Patient states she received flu vaccine at "Dr. Annita Brod" office beginning of October 2016 Added to historical immunization record. Velora Heckler, RN

## 2016-03-10 DIAGNOSIS — M25511 Pain in right shoulder: Secondary | ICD-10-CM | POA: Diagnosis not present

## 2016-03-13 ENCOUNTER — Other Ambulatory Visit: Payer: Self-pay | Admitting: Orthopedic Surgery

## 2016-03-13 DIAGNOSIS — M25511 Pain in right shoulder: Secondary | ICD-10-CM

## 2016-03-15 ENCOUNTER — Other Ambulatory Visit: Payer: Self-pay | Admitting: Acute Care

## 2016-03-15 DIAGNOSIS — F17210 Nicotine dependence, cigarettes, uncomplicated: Secondary | ICD-10-CM

## 2016-03-17 ENCOUNTER — Ambulatory Visit
Admission: RE | Admit: 2016-03-17 | Discharge: 2016-03-17 | Disposition: A | Discharge status: Home / Self Care | Payer: Medicare Other | Source: Ambulatory Visit | Attending: Orthopedic Surgery | Admitting: Orthopedic Surgery

## 2016-03-17 DIAGNOSIS — M25511 Pain in right shoulder: Secondary | ICD-10-CM

## 2016-03-17 DIAGNOSIS — M19011 Primary osteoarthritis, right shoulder: Secondary | ICD-10-CM | POA: Diagnosis not present

## 2016-03-21 DIAGNOSIS — M19011 Primary osteoarthritis, right shoulder: Secondary | ICD-10-CM | POA: Diagnosis not present

## 2016-03-21 DIAGNOSIS — M24011 Loose body in right shoulder: Secondary | ICD-10-CM | POA: Diagnosis not present

## 2016-03-21 DIAGNOSIS — M75110 Incomplete rotator cuff tear or rupture of unspecified shoulder, not specified as traumatic: Secondary | ICD-10-CM | POA: Diagnosis not present

## 2016-03-29 DIAGNOSIS — M24011 Loose body in right shoulder: Secondary | ICD-10-CM | POA: Diagnosis not present

## 2016-03-29 DIAGNOSIS — M24111 Other articular cartilage disorders, right shoulder: Secondary | ICD-10-CM | POA: Diagnosis not present

## 2016-03-29 DIAGNOSIS — S43491A Other sprain of right shoulder joint, initial encounter: Secondary | ICD-10-CM | POA: Diagnosis not present

## 2016-03-29 DIAGNOSIS — G8918 Other acute postprocedural pain: Secondary | ICD-10-CM | POA: Diagnosis not present

## 2016-03-29 DIAGNOSIS — M19011 Primary osteoarthritis, right shoulder: Secondary | ICD-10-CM | POA: Diagnosis not present

## 2016-03-29 DIAGNOSIS — D480 Neoplasm of uncertain behavior of bone and articular cartilage: Secondary | ICD-10-CM | POA: Diagnosis not present

## 2016-03-29 DIAGNOSIS — M7541 Impingement syndrome of right shoulder: Secondary | ICD-10-CM | POA: Diagnosis not present

## 2016-03-29 DIAGNOSIS — M659 Synovitis and tenosynovitis, unspecified: Secondary | ICD-10-CM | POA: Diagnosis not present

## 2016-03-29 DIAGNOSIS — M94211 Chondromalacia, right shoulder: Secondary | ICD-10-CM | POA: Diagnosis not present

## 2016-04-04 DIAGNOSIS — Z9889 Other specified postprocedural states: Secondary | ICD-10-CM | POA: Diagnosis not present

## 2016-04-04 DIAGNOSIS — M7541 Impingement syndrome of right shoulder: Secondary | ICD-10-CM | POA: Diagnosis not present

## 2016-04-04 DIAGNOSIS — M25611 Stiffness of right shoulder, not elsewhere classified: Secondary | ICD-10-CM | POA: Diagnosis not present

## 2016-04-10 DIAGNOSIS — M7541 Impingement syndrome of right shoulder: Secondary | ICD-10-CM | POA: Diagnosis not present

## 2016-04-10 DIAGNOSIS — Z9889 Other specified postprocedural states: Secondary | ICD-10-CM | POA: Diagnosis not present

## 2016-04-10 DIAGNOSIS — M25611 Stiffness of right shoulder, not elsewhere classified: Secondary | ICD-10-CM | POA: Diagnosis not present

## 2016-07-13 DIAGNOSIS — I10 Essential (primary) hypertension: Secondary | ICD-10-CM | POA: Diagnosis not present

## 2016-07-13 DIAGNOSIS — R531 Weakness: Secondary | ICD-10-CM | POA: Diagnosis not present

## 2016-07-13 DIAGNOSIS — H8111 Benign paroxysmal vertigo, right ear: Secondary | ICD-10-CM | POA: Diagnosis not present

## 2016-08-16 DIAGNOSIS — J45998 Other asthma: Secondary | ICD-10-CM | POA: Diagnosis not present

## 2016-08-16 DIAGNOSIS — Z6834 Body mass index (BMI) 34.0-34.9, adult: Secondary | ICD-10-CM | POA: Diagnosis not present

## 2016-08-16 DIAGNOSIS — Z79899 Other long term (current) drug therapy: Secondary | ICD-10-CM | POA: Diagnosis not present

## 2016-08-16 DIAGNOSIS — H9190 Unspecified hearing loss, unspecified ear: Secondary | ICD-10-CM | POA: Diagnosis not present

## 2016-08-16 DIAGNOSIS — I10 Essential (primary) hypertension: Secondary | ICD-10-CM | POA: Diagnosis not present

## 2016-08-16 DIAGNOSIS — H8111 Benign paroxysmal vertigo, right ear: Secondary | ICD-10-CM | POA: Diagnosis not present

## 2016-08-16 DIAGNOSIS — E785 Hyperlipidemia, unspecified: Secondary | ICD-10-CM | POA: Diagnosis not present

## 2016-08-16 DIAGNOSIS — Z13820 Encounter for screening for osteoporosis: Secondary | ICD-10-CM | POA: Diagnosis not present

## 2016-08-16 DIAGNOSIS — K219 Gastro-esophageal reflux disease without esophagitis: Secondary | ICD-10-CM | POA: Diagnosis not present

## 2016-08-16 DIAGNOSIS — R531 Weakness: Secondary | ICD-10-CM | POA: Diagnosis not present

## 2016-08-16 DIAGNOSIS — Z0000 Encounter for general adult medical examination without abnormal findings: Secondary | ICD-10-CM | POA: Diagnosis not present

## 2016-08-16 DIAGNOSIS — Z1389 Encounter for screening for other disorder: Secondary | ICD-10-CM | POA: Diagnosis not present

## 2016-08-16 DIAGNOSIS — F17201 Nicotine dependence, unspecified, in remission: Secondary | ICD-10-CM | POA: Diagnosis not present

## 2016-08-16 DIAGNOSIS — E559 Vitamin D deficiency, unspecified: Secondary | ICD-10-CM | POA: Diagnosis not present

## 2016-08-16 DIAGNOSIS — Z7189 Other specified counseling: Secondary | ICD-10-CM | POA: Diagnosis not present

## 2016-08-16 DIAGNOSIS — F325 Major depressive disorder, single episode, in full remission: Secondary | ICD-10-CM | POA: Diagnosis not present

## 2016-09-26 DIAGNOSIS — B029 Zoster without complications: Secondary | ICD-10-CM | POA: Diagnosis not present

## 2016-10-16 ENCOUNTER — Encounter: Payer: Self-pay | Admitting: Acute Care

## 2016-11-01 ENCOUNTER — Ambulatory Visit (INDEPENDENT_AMBULATORY_CARE_PROVIDER_SITE_OTHER): Payer: Medicare Other | Admitting: Ophthalmology

## 2016-11-20 DIAGNOSIS — Z23 Encounter for immunization: Secondary | ICD-10-CM | POA: Diagnosis not present

## 2017-01-03 DIAGNOSIS — N6459 Other signs and symptoms in breast: Secondary | ICD-10-CM | POA: Diagnosis not present

## 2017-01-03 DIAGNOSIS — I10 Essential (primary) hypertension: Secondary | ICD-10-CM | POA: Diagnosis not present

## 2017-01-12 DIAGNOSIS — J329 Chronic sinusitis, unspecified: Secondary | ICD-10-CM | POA: Diagnosis not present

## 2017-01-25 DIAGNOSIS — N6453 Retraction of nipple: Secondary | ICD-10-CM | POA: Diagnosis not present

## 2017-01-25 DIAGNOSIS — N6310 Unspecified lump in the right breast, unspecified quadrant: Secondary | ICD-10-CM | POA: Diagnosis not present

## 2017-01-29 ENCOUNTER — Other Ambulatory Visit: Payer: Self-pay | Admitting: Radiology

## 2017-01-29 DIAGNOSIS — D0591 Unspecified type of carcinoma in situ of right breast: Secondary | ICD-10-CM | POA: Diagnosis not present

## 2017-01-29 DIAGNOSIS — C50811 Malignant neoplasm of overlapping sites of right female breast: Secondary | ICD-10-CM | POA: Diagnosis not present

## 2017-01-31 ENCOUNTER — Telehealth: Payer: Self-pay | Admitting: *Deleted

## 2017-01-31 NOTE — Telephone Encounter (Signed)
Confirmed [facility] for 02/07/17 at 1215 .  Instructions and contact information given.

## 2017-02-06 ENCOUNTER — Other Ambulatory Visit: Payer: Self-pay | Admitting: *Deleted

## 2017-02-06 DIAGNOSIS — Z170 Estrogen receptor positive status [ER+]: Principal | ICD-10-CM | POA: Insufficient documentation

## 2017-02-06 DIAGNOSIS — C50411 Malignant neoplasm of upper-outer quadrant of right female breast: Secondary | ICD-10-CM | POA: Insufficient documentation

## 2017-02-07 ENCOUNTER — Encounter: Payer: Self-pay | Admitting: Oncology

## 2017-02-07 ENCOUNTER — Other Ambulatory Visit: Payer: Self-pay | Admitting: *Deleted

## 2017-02-07 ENCOUNTER — Ambulatory Visit: Payer: Self-pay | Admitting: General Surgery

## 2017-02-07 ENCOUNTER — Other Ambulatory Visit (HOSPITAL_BASED_OUTPATIENT_CLINIC_OR_DEPARTMENT_OTHER): Payer: Medicare Other

## 2017-02-07 ENCOUNTER — Encounter: Payer: Self-pay | Admitting: *Deleted

## 2017-02-07 ENCOUNTER — Ambulatory Visit
Admission: RE | Admit: 2017-02-07 | Discharge: 2017-02-07 | Disposition: A | Discharge status: Home / Self Care | Payer: Medicare Other | Source: Ambulatory Visit | Attending: Radiation Oncology | Admitting: Radiation Oncology

## 2017-02-07 ENCOUNTER — Encounter: Payer: Self-pay | Admitting: Physical Therapy

## 2017-02-07 ENCOUNTER — Ambulatory Visit (HOSPITAL_BASED_OUTPATIENT_CLINIC_OR_DEPARTMENT_OTHER): Payer: Medicare Other | Admitting: Oncology

## 2017-02-07 ENCOUNTER — Ambulatory Visit: Payer: Medicare Other | Attending: General Surgery | Admitting: Physical Therapy

## 2017-02-07 VITALS — BP 135/53 | HR 79 | Temp 97.7°F | Resp 18 | Ht 61.0 in | Wt 188.6 lb

## 2017-02-07 DIAGNOSIS — Z720 Tobacco use: Secondary | ICD-10-CM | POA: Diagnosis not present

## 2017-02-07 DIAGNOSIS — F17210 Nicotine dependence, cigarettes, uncomplicated: Secondary | ICD-10-CM | POA: Diagnosis not present

## 2017-02-07 DIAGNOSIS — R293 Abnormal posture: Secondary | ICD-10-CM | POA: Diagnosis not present

## 2017-02-07 DIAGNOSIS — Z170 Estrogen receptor positive status [ER+]: Secondary | ICD-10-CM

## 2017-02-07 DIAGNOSIS — C50811 Malignant neoplasm of overlapping sites of right female breast: Secondary | ICD-10-CM

## 2017-02-07 DIAGNOSIS — C50911 Malignant neoplasm of unspecified site of right female breast: Secondary | ICD-10-CM

## 2017-02-07 DIAGNOSIS — G8929 Other chronic pain: Secondary | ICD-10-CM

## 2017-02-07 DIAGNOSIS — Z800 Family history of malignant neoplasm of digestive organs: Secondary | ICD-10-CM

## 2017-02-07 DIAGNOSIS — M25511 Pain in right shoulder: Secondary | ICD-10-CM | POA: Diagnosis not present

## 2017-02-07 DIAGNOSIS — C50411 Malignant neoplasm of upper-outer quadrant of right female breast: Secondary | ICD-10-CM | POA: Insufficient documentation

## 2017-02-07 DIAGNOSIS — M25612 Stiffness of left shoulder, not elsewhere classified: Secondary | ICD-10-CM | POA: Diagnosis not present

## 2017-02-07 DIAGNOSIS — M25611 Stiffness of right shoulder, not elsewhere classified: Secondary | ICD-10-CM

## 2017-02-07 DIAGNOSIS — Z803 Family history of malignant neoplasm of breast: Secondary | ICD-10-CM | POA: Diagnosis not present

## 2017-02-07 LAB — COMPREHENSIVE METABOLIC PANEL
ALBUMIN: 3.8 g/dL (ref 3.5–5.0)
ALK PHOS: 65 U/L (ref 40–150)
ALT: 18 U/L (ref 0–55)
ANION GAP: 11 meq/L (ref 3–11)
AST: 13 U/L (ref 5–34)
BUN: 20.2 mg/dL (ref 7.0–26.0)
CO2: 25 mEq/L (ref 22–29)
Calcium: 10 mg/dL (ref 8.4–10.4)
Chloride: 102 mEq/L (ref 98–109)
Creatinine: 1.3 mg/dL — ABNORMAL HIGH (ref 0.6–1.1)
EGFR: 44 mL/min/{1.73_m2} — AB (ref >90)
GLUCOSE: 73 mg/dL (ref 70–140)
POTASSIUM: 3.8 meq/L (ref 3.5–5.1)
Sodium: 139 mEq/L (ref 136–145)
Total Bilirubin: 0.39 mg/dL (ref 0.20–1.20)
Total Protein: 8 g/dL (ref 6.4–8.3)

## 2017-02-07 LAB — CBC WITH DIFFERENTIAL/PLATELET
BASO%: 0.7 % (ref 0.0–2.0)
BASOS ABS: 0.1 10*3/uL (ref 0.0–0.1)
EOS ABS: 0 10*3/uL (ref 0.0–0.5)
EOS%: 0.4 % (ref 0.0–7.0)
HCT: 40.9 % (ref 34.8–46.6)
HEMOGLOBIN: 13.8 g/dL (ref 11.6–15.9)
LYMPH%: 24.3 % (ref 14.0–49.7)
MCH: 31 pg (ref 25.1–34.0)
MCHC: 33.7 g/dL (ref 31.5–36.0)
MCV: 92.1 fL (ref 79.5–101.0)
MONO#: 0.7 10*3/uL (ref 0.1–0.9)
MONO%: 8.7 % (ref 0.0–14.0)
NEUT#: 5.2 10*3/uL (ref 1.5–6.5)
NEUT%: 65.9 % (ref 38.4–76.8)
Platelets: 292 10*3/uL (ref 145–400)
RBC: 4.44 10*6/uL (ref 3.70–5.45)
RDW: 13.7 % (ref 11.2–14.5)
WBC: 7.9 10*3/uL (ref 3.9–10.3)
lymph#: 1.9 10*3/uL (ref 0.9–3.3)

## 2017-02-07 NOTE — Therapy (Addendum)
[place], [place], [address] Phone: [phone]   Fax:  [phone]  Physical Therapy Evaluation  Patient Details  Name: Kimberly Waters MRN: [id number] Date of Birth: August 22, 1946 Referring Provider: Dr. Excell Seltzer  Encounter Date: 02/07/2017      PT End of Session - 02/07/17 1221    Visit Number 1   Number of Visits 1   PT Start Time 0930   PT Stop Time 0955   PT Time Calculation (min) 25 min   Activity Tolerance Patient tolerated treatment well   Behavior During Therapy [facility] for tasks assessed/performed      Past Medical History:  Diagnosis Date  . Allergy   . Depression   . GERD (gastroesophageal reflux disease)   . Hyperlipidemia   . Hypertension   . Thyroid disease     Past Surgical History:  Procedure Laterality Date  . ABDOMINAL HYSTERECTOMY    . KNEE SURGERY    . SHOULDER SURGERY      There were no vitals filed for this visit.       Subjective Assessment - 02/07/17 1211    Subjective Patient reports she is here today to be seen by her medical team for her newly diagnosed right breast cancer.   Patient is accompained by: Family member   Pertinent History Patient was diagnosed 01/25/17 with right grade 1-2 invasive ductal carcinoma breast cancer. There are 2 masses both located in the upper outer quadrant measuring 2.2 cm and 2.5 cm. They are both ER/PR positive and HER2 negative with a Ki67 of 5-10%.    Patient Stated Goals reduce lymphedema risk and learn post op shoulder ROM HEP   Currently in Pain? Yes   Pain Score 7    Pain Location Shoulder   Pain Orientation Right   Pain Descriptors / Indicators Tightness   Pain Type Chronic pain   Pain Onset More than a month ago   Pain Frequency Intermittent   Aggravating Factors  Reaching overhead   Pain Relieving Factors rest   Multiple Pain Sites No            [facility] PT Assessment - 02/07/17 0001      Assessment    Medical Diagnosis Right breast cancer   Referring Provider Dr. Excell Seltzer   Onset Date/Surgical Date 01/25/17   Hand Dominance Right   Prior Therapy none     Precautions   Precautions Other (comment)   Precaution Comments active cancer     Restrictions   Weight Bearing Restrictions No     Balance Screen   Has the patient fallen in the past 6 months No   Has the patient had a decrease in activity level because of a fear of falling?  No   Is the patient reluctant to leave their home because of a fear of falling?  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   Leisure She does not exercise     Cognition   Overall Cognitive Status Within Functional Limits for tasks assessed     Posture/Postural Control   Posture/Postural Control Postural limitations   Postural Limitations Rounded Shoulders;Forward head     ROM / Strength  AROM / PROM / Strength AROM;Strength     AROM   AROM Assessment Site Shoulder;Cervical   Right/Left Shoulder Right;Left   Right Shoulder Extension 40 Degrees   Right Shoulder Flexion 128 Degrees  With c/o pain   Right Shoulder ABduction 91 Degrees   Right Shoulder Internal Rotation 57 Degrees   Right Shoulder External Rotation 59 Degrees   Left Shoulder Extension 50 Degrees   Left Shoulder Flexion 108 Degrees   Left Shoulder ABduction 105 Degrees   Left Shoulder Internal Rotation 68 Degrees   Left Shoulder External Rotation 50 Degrees   Cervical Flexion WNL   Cervical Extension WNL   Cervical - Right Side Bend WNL   Cervical - Left Side Bend WNL   Cervical - Right Rotation 25% limited   Cervical - Left Rotation 25% limited     Strength   Overall Strength Within functional limits for tasks performed      Palpation   Palpation comment Tender to palpation right bicep tendon insertion area           LYMPHEDEMA/ONCOLOGY QUESTIONNAIRE - 02/07/17 1219      Type   Cancer Type Right breast cancer     Lymphedema Assessments   Lymphedema Assessments Upper extremities     Right Upper Extremity Lymphedema   10 cm Proximal to Olecranon Process 33.6 cm   Olecranon Process 27.2 cm   10 cm Proximal to Ulnar Styloid Process 24.7 cm   Just Proximal to Ulnar Styloid Process 18.2 cm   Across Hand at [employer] 20.6 cm   At [place] of 2nd Digit 6.9 cm     Left Upper Extremity Lymphedema   10 cm Proximal to Olecranon Process 33.8 cm   Olecranon Process 26.5 cm   10 cm Proximal to Ulnar Styloid Process 24.2 cm   Just Proximal to Ulnar Styloid Process 18.5 cm   Across Hand at [employer] 19.8 cm   At [place] of 2nd Digit 6.4 cm       Patient was instructed today in a home exercise program today for post op shoulder range of motion. These included active assist shoulder flexion in sitting, scapular retraction, wall walking with shoulder abduction, and hands behind head external rotation.  She was encouraged to do these twice a day, holding 3 seconds and repeating 5 times when permitted by her physician.         PT Education - 02/07/17 1220    Education provided Yes   Education Details Lymphedema risk reduction and post op shoulder ROM HEP   Person(s) Educated Patient;Child(ren)   Methods Explanation;Demonstration;Handout   Comprehension Returned demonstration;Verbalized understanding              Breast Clinic Goals - 02/07/17 1232      Patient will be able to verbalize understanding of pertinent lymphedema risk reduction practices relevant to her diagnosis specifically related to skin care.   Time 1   Period Days   Status Achieved     Patient will be able to return demonstrate and/or verbalize understanding of the post-op home exercise program related to regaining  shoulder range of motion.   Time 1   Period Days   Status Achieved     Patient will be able to verbalize understanding of the importance of attending the postoperative After Breast Cancer Class for further lymphedema risk reduction education and therapeutic exercise.   Time 1   Period Days   Status Achieved  [facility] Goals - 02/07/17 1232      CC Long Term Goal  #1   Title Patient will be independent with her home exercise program for shoulder ROM   Time 4   Period Weeks   Status New     CC Long Term Goal  #2   Title Increase right shoulder active flexion to >/= 140 and abduction to >/= 110 degrees for increased ease of motion and for better positioning during surgery.   Time 4   Period Weeks   Status New     CC Long Term Goal  #3   Title Increase left shoulder active flexion to >/= 125 degrees and abduction to >/= 120 degrees for increased ease of motion and for better positioning during surgery.   Time 4   Period Weeks   Status New     CC Long Term Goal  #4   Title Patient will report >/= 25% less pain in right shoulder to tolerate daily tasks with greater ease.   Time 4   Period Weeks   Status New            Plan - 02/07/17 1222    Clinical Impression Statement Patient was diagnosed 01/25/17 with right grade 1-2 invasive ductal carcinoma breast cancer. There are 2 masses both located in the upper outer quadrant measuring 2.2 cm and 2.5 cm. They are both ER/PR positive and HER2 negative with a Ki67 of 5-10%. Her multidisciplinary medical team met prior to her assessments to determine a recommended treatment plan.  She is planning to have genetic testing followed by a bilateral mastectomy with a right sentinel node biopsy, Oncotype testing, and anti-estrogen therapy. She will benefit from physical therapy pre-operatively to try to improve bilateral shoulder ROM and reduce pain for better surgical outcomes.  She may also require post op PT to regain  shoulder ROM and reduce lymphedema risk. She had a right shoulder scope in 4/17 which she reports had good results but reports now she feels like her pain and deficits has returned. Due to her current smoking status of 1 pack per day which limits her endurance and cadio exercise tolerance and due to her previous right shoulder surgery, and as her condition is evolving and dependent on genetic testing results, her eval is of moderate complexity.   Rehab Potential Good   Clinical Impairments Affecting Rehab Potential Pre-existing shoulder deficits   PT Frequency 2x / week   PT Duration 4 weeks   PT Treatment/Interventions Patient/family education;Therapeutic exercise;ADLs/Self Care Home Management;Electrical Stimulation;Iontophoresis 44m/ml Dexamethasone;Moist Heat;Therapeutic activities;Passive range of motion;Manual techniques;Vasopneumatic Device  Modality plan will change post operatively   PT Next Visit Plan PROM, pulleys, ball up wall, modalities prn as indicated above   PT Home Exercise Plan Post op shoulder ROM HEP   Consulted and Agree with Plan of Care Patient;Family member/caregiver   Family Member Consulted Adult daughter      Patient will benefit from skilled therapeutic intervention in order to improve the following deficits and impairments:  Postural dysfunction, Impaired flexibility, Decreased knowledge of precautions, Pain, Impaired UE functional use, Decreased range of motion  Visit Diagnosis: Carcinoma of upper-outer quadrant of right breast in female, estrogen receptor positive ([place] - Plan: PT plan of care cert/re-cert  Abnormal posture - Plan: PT plan of care cert/re-cert  Chronic right shoulder pain - Plan: PT plan of care cert/re-cert  Stiffness of right shoulder, not elsewhere classified - Plan: PT plan of care cert/re-cert  Stiffness of left shoulder, not elsewhere classified - Plan: PT plan of care cert/re-cert  Patient will follow up at outpatient cancer rehab if  needed following surgery.  If the patient requires physical therapy at that time, a specific plan will be dictated and sent to the referring physician for approval. The patient was educated today on appropriate basic range of motion exercises to begin post operatively and the importance of attending the After Breast Cancer class following surgery.  Patient was educated today on lymphedema risk reduction practices as it pertains to recommendations that will benefit the patient immediately following surgery.  She verbalized good understanding.        G-Codes - 2017-02-10 1244    Functional Assessment Tool Used Clinical Judgement   Functional Limitation Other PT primary   Other PT Primary Current Status (G8185) At least 40 percent but less than 60 percent impaired, limited or restricted   Other PT Primary Goal Status (U3149) At least 20 percent but less than 40 percent impaired, limited or restricted       Problem List Patient Active Problem List   Diagnosis Date Noted  . Malignant neoplasm of upper-outer quadrant of right breast in female, estrogen receptor positive ([place]) 02/06/2017  . Hypotension 12/31/2012  . Asthma exacerbation 12/30/2012  . Dehydration 12/30/2012  . Viral gastroenteritis 12/30/2012  . Vitamin D deficiency 04/16/2012  . ASTHMA, PERSISTENT 04/24/2007  . HYPOTHYROIDISM, UNSPECIFIED 02/21/2007  . HYPERTRIGLYCERIDEMIA 02/21/2007  . HYPERLIPIDEMIA 02/21/2007  . DEPRESSION, MAJOR, RECURRENT 02/21/2007  . TOBACCO DEPENDENCE 02/21/2007  . HYPERTENSION, BENIGN SYSTEMIC 02/21/2007  . SINUSITIS, CHRONIC, NOS 02/21/2007  . RHINITIS, ALLERGIC 02/21/2007  . DIVERTICULOSIS OF COLON 02/21/2007  . HEARTBURN 02/21/2007  . INCONTINENCE, URGE 02/21/2007    Annia Friendly, PT February 10, 2017 12:48 PM  [place], [place], [address] Phone: [phone]   Fax:  [phone]  Name: Kimberly Waters MRN:  [id number] Date of Birth: November 29, 1946

## 2017-02-07 NOTE — Progress Notes (Signed)
Clinical Social Work [facility] Psychosocial Distress Screening Kimberly Waters  Patient completed distress screening protocol and scored a 1 on the Psychosocial Distress Thermometer which indicates mild distress. Clinical Social Worker met with patient and patients family in [facility] to assess for distress and other psychosocial needs. Patient stated she was feeling overwhelmed but felt "better" after meeting with the treatment team and getting more information on her treatment plan. CSW and patient discussed common feeling and emotions when being diagnosed with cancer, and the importance of support during treatment. CSW informed patient of the support team and support services at [facility], and patient was agreeable to an [facility] guide referral. CSW provided contact information and encouraged patient to call with any questions or concerns.    ONCBCN DISTRESS SCREENING 02/07/2017  Screening Type Initial Screening  Distress experienced in past week (1-10) 1  Emotional problem type Nervousness/Anxiety;Adjusting to illness     Kimberly Waters, MSW, LCSW, OSW-C Clinical Social Worker [facility] [phone]

## 2017-02-07 NOTE — Patient Instructions (Signed)

## 2017-02-07 NOTE — Progress Notes (Signed)
Radiation Oncology         ([phone]) [phone] ________________________________  Initial Outpatient Consultation  Name: Kimberly Waters MRN: [id number]  Date: 02/07/2017  DOB: [birth year]  OV:FIEPP,IRJJOA NEVILL, MD  Excell Seltzer, MD   REFERRING PHYSICIAN: Excell Seltzer, MD  DIAGNOSIS: Clinical Stage mT2, N0, M0 Invasive Ductal Carcinoma of the Right Breast, ER/PR Positive, HER2 Negative, Grade 1-2.  HISTORY OF PRESENT ILLNESS::Kimberly Waters is a 71 y.o. female who presented with nipple retraction and subsequent abnormal mammogram, showing a 2.2 cm irregular mass in the right breast central to the nipple anterior depth. An additional 2.5 cm irregular mass was seen in the 12 o'clock region of the right breast. Subsequent biopsy of the right breast on 01/29/17 revealed Invasive and In Situ mammary Carcinoma in the 12 o'clock position, with lymphovascular invasion identified. Additional biopsy on the same day showed retroareolar Invasive and In Situ Mammary Carcinoma, also in the 12 o'clock position of the right breast. Pathology supports a ductal phenotype.  On review of systems, the patient is positive for glasses, hearing aids, and thyroid hormones. She reports right nipple inversion. She reports shortness of breath when walking uphill.  Gynecologic History: Age at first menstrual period? 14 Are you still having periods? No  Approximate date of last period? Not reported If you no longer have periods: Have you used hormone replacement? Not reported  Obstetric History:  How many children have you carried to term? 2  Your age at first live birth? 89 Pregnant now or trying to get pregnant? No Have you used birth control pills or hormone shots for contraception? Yes If so, for how long (or approximate dates)? Approximately 3 years  Health Maintenance: Have you ever had a colonoscopy? Yes If yes, date? 2016 Have you ever had a bone density? No Date of your last PAP smear? Not reported  Date of your FIRST mammogram? 80, at age 78  Of note, the patient has a significant family history of cancer including colon cancer in maternal grandfather, maternal aunt, and maternal uncle, and breast cancer in paternal aunt and paternal cousin.   PREVIOUS RADIATION THERAPY: No  PAST MEDICAL HISTORY:  has a past medical history of Allergy; Depression; GERD (gastroesophageal reflux disease); Hyperlipidemia; Hypertension; and Thyroid disease.    PAST SURGICAL HISTORY: Past Surgical History:  Procedure Laterality Date  . ABDOMINAL HYSTERECTOMY    . KNEE SURGERY    . SHOULDER SURGERY      FAMILY HISTORY: family history includes Breast cancer in her cousin and paternal aunt; Colon cancer in her maternal aunt, maternal grandfather, and maternal uncle.  SOCIAL HISTORY:  reports that she has been smoking Cigarettes.  She has a 45.00 pack-year smoking history. She has never used smokeless tobacco. She reports that she does not drink alcohol or use drugs.  ALLERGIES: Codeine  MEDICATIONS:  Current Outpatient Prescriptions  Medication Sig Dispense Refill  . albuterol (PROVENTIL,VENTOLIN) 90 MCG/ACT inhaler Inhale 2 puffs into the lungs every 4 (four) hours as needed. 17 g 3  . imipramine (TOFRANIL) 50 MG tablet Take 4 tablets (200 mg total) by mouth at bedtime. 360 tablet 0  . levothyroxine (SYNTHROID, LEVOTHROID) 112 MCG tablet TAKE ONE TABLET BY MOUTH ONCE DAILY 30 tablet 0  . lisinopril-hydrochlorothiazide (PRINZIDE,ZESTORETIC) 20-25 MG per tablet TAKE ONE TABLET BY MOUTH ONCE DAILY (Patient taking differently: Take 0.5 tablets by mouth daily. TAKE ONE TABLET BY MOUTH ONCE DAILY) 60 tablet 0  . METOPROLOL SUCCINATE ER PO Take by mouth.    Marland Kitchen  omeprazole (PRILOSEC) 20 MG capsule Take 20 mg by mouth daily.    . simvastatin (ZOCOR) 20 MG tablet Take 1 tablet (20 mg total) by mouth at bedtime. 90 tablet 0   No current facility-administered medications for this encounter.     REVIEW OF  SYSTEMS:  A 15 point review of systems is documented in the electronic medical record. This was obtained by the nursing staff. However, I reviewed this with the patient to discuss relevant findings and make appropriate changes.  Pertinent items are noted in HPI.   PHYSICAL EXAM:  Vitals with BMI 02/07/2017  Height '5\' 1"'$   Weight 188 lbs 10 oz  BMI 51.0  Systolic 258  Diastolic 53  Pulse 79  Respirations 18  General: Alert and oriented, in no acute distress.  HEENT: Head is normocephalic. Extraocular movements are intact. Oropharynx is clear. Neck: Neck is supple, no palpable cervical or supraclavicular lymphadenopathy. Heart: Regular in rate and rhythm with no murmurs, rubs, or gallops. Chest: Clear to auscultation bilaterally, with no rhonchi, wheezes, or rales. Abdomen: Soft, nontender, nondistended, with no rigidity or guarding. Extremities: No cyanosis or edema. Lymphatics: see Neck Exam Skin: No concerning lesions. Musculoskeletal: symmetric strength and muscle tone throughout. Neurologic: Cranial nerves II through XII are grossly intact. No obvious focalities. Speech is fluent. Coordination is intact. Psychiatric: Judgment and insight are intact. Affect is appropriate. Left breast shows no palpable mass or nipple discharge. On examination of the right breast the patient has nipple retraction. When lying recumbent the patient has a palpable mass in the central aspect of the breast under the nipple-areolar complex, more outwards towards the upper outer quadrant, and measuring approximately 3 x 4 cm in size. This area is mildly tender with palpation.   ECOG = 1  LABORATORY DATA:  Lab Results  Component Value Date   WBC 7.9 02/07/2017   HGB 13.8 02/07/2017   HCT 40.9 02/07/2017   MCV 92.1 02/07/2017   PLT 292 02/07/2017   NEUTROABS 5.2 02/07/2017   Lab Results  Component Value Date   NA 139 02/07/2017   K 3.8 02/07/2017   CL 96 02/12/2013   CO2 25 02/07/2017   GLUCOSE 73  02/07/2017   CREATININE 1.3 (H) 02/07/2017   CALCIUM 10.0 02/07/2017      RADIOGRAPHY: No results found.    IMPRESSION: Clinical Stage mT2, N0, M0 Invasive Ductal Carcinoma of the Right Breast, ER/PR Positive, HER2 Negative, Grade 1-2.  Based on the location of the lesion and finding two separate breast cancers somewhat distant apart, the patient would require a mastectomy for management. She would not be a candidate for breast conservation therapy. The patient has decided she would like to proceed with bilateral mastectomies for management. Depending on final pathological findings she may or may not benefit from post-mastectomy irradiation. The patient is not interested in breast reconstruction.  PLAN: The patient will proceed with the scheduling of her surgery for bilateral mastectomies and sentinel node procedure. She will also receive Oncotype Dx testing. She will begin an aromatase inhibitor. Depending on final pathology the patient may be seen in radiation oncology for consideration of post-mastectomy irradiation.      ------------------------------------------------  Blair Promise, PhD, MD  This document serves as a record of services personally performed by Gery Pray, MD. It was created on his behalf by Maryla Morrow, a trained medical scribe. The creation of this record is based on the scribe's personal observations and the provider's statements to them. This  document has been checked and approved by the attending provider.

## 2017-02-07 NOTE — Progress Notes (Signed)
[place]  [phone] [phone]     ID: Kimberly Waters DOB: 1946-05-27  MR#: [id number]  DSK#:[id number]  Patient Care Team: Kimberly Huddle, MD as PCP - General (Internal Medicine) Kimberly Craver, MD as Consulting Physician (Gastroenterology) Kimberly Cruel, MD as Consulting Physician (Oncology) Kimberly Seltzer, MD as Consulting Physician (General Surgery) Kimberly Pray, MD as Consulting Physician (Radiation Oncology) Kimberly Cruel, MD OTHER MD:  CHIEF COMPLAINT: Estrogen receptor positive breast cancer  CURRENT TREATMENT: Awaiting definitive surgery   BREAST CANCER HISTORY: Kimberly Waters had an unremarkable mammogram 07/01/2012, after which she did not obtain further mammography. Sometime late 2017 she noted the right nipple was inverted. She brought this to medical attention and on 01/25/2017 she underwent bilateral mammography with tomography and right breast ultrasonography at [facility]. The breast density was category C. In the central right breast there was an area of architectural distortion associated with nipple retraction. Ultrasound confirmed a 2.2 cm irregular mass in the right breast central to the nipple. In addition there was a 2.5 cm irregular mass in the right breast superiorly. The right axilla was sonographically benign.  On 01/29/2017 she underwent biopsy of both these masses, and both showed morphologically similar invasive ductal carcinomas, grade 1 or 2, with evidence of lymphovascular invasion. Both tumors were 100% estrogen receptor positive with strong staining intensity, and 60-100% progesterone receptor positive, with strong staining intensity. The MIP-1 varied from 5-10%. Both tumors were HER-2 negative, with a signals ratio 1.12-1.39, and the number per cell 2.30-3.97.  The patientWaters subsequent history is as detailed below   INTERVAL HISTORY: Kimberly [place] was evaluated in the multidisciplinary breast cancer clinic 02/07/2017 accompanied by  her daughterJami. Her case was also presented in the multidisciplinary breast cancer conference that same morning. At that time a preliminary plan was proposed: Mastectomy with sentinel lymph node sampling, Oncotype DX testing, adjuvant radiation as appropriate, long-term hormone therapy   REVIEW OF SYSTEMS:  aside from the inverted nipple, there were no specific symptoms leading to the original mammogram, which was routinely scheduled. The patient denies unusual headaches, visual changes, nausea, vomiting, stiff neck, dizziness, or gait imbalance. There has been no cough, phlegm production, or pleurisy, no chest pain or pressure, and no change in bowel or bladder habits. The patient denies fever, rash, bleeding, unexplained fatigue or unexplained weight loss.Kim[place]ly [place] does not exercise regularly.  A detailed review of systems was otherwise entirely negative.    PAST MEDICAL HISTORY: Past Medical History:  Diagnosis Date  . Allergy   . Depression   . GERD (gastroesophageal reflux disease)   . Hyperlipidemia   . Hypertension   . Thyroid disease     PAST SURGICAL HISTORY: Past Surgical History:  Procedure Laterality Date  . ABDOMINAL HYSTERECTOMY    . KNEE SURGERY    . SHOULDER SURGERY      FAMILY HISTORY Family History  Problem Relation Age of Onset  . Colon cancer Maternal Grandfather   . Colon cancer Maternal Aunt   . Colon cancer Maternal Uncle   . Breast cancer Paternal Aunt   . Breast cancer Cousin    The patientWaters father died at age 50. Her mother is 65 years old as of February 2018. The patient had no brothers, 1 sister. On the motherWaters side a grandfather, and an uncle all had colon cancerWaters. On the fatherWaters side there is an aunt and a cousin with breast cancer, both diagnosed in their late 80s. There is no history of ovarian cancer  in the family.  GYNECOLOGIC HISTORY:  No LMP recorded. Patient has had a hysterectomy.  menarche age 19, first live birth age 12, the patient is [place]  P2. She underwent total abdominal hysterectomy with bilateral salpingo-oophorectomy remotely and took hormone replacement for approximately 6 years.   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 Her husband Kimberly Waters ("[address] [place] is retired from [employer] [employer]. Their son Kimberly Waters is an Chief Financial Officer living in [place]. Their daughter Kimberly [place] works at [employer] in [place] the patient has 5 grandchildren. She is a Tourist information centre manager     ADVANCED DIRECTIVES:  not in place    HEALTH MAINTENANCE: Social History  Substance Use Topics  . Smoking status: Current Every Day Smoker    Packs/day: 1.00    Years: 45.00    Types: Cigarettes  . Smokeless tobacco: Never Used  . Alcohol use No     Colonoscopy: 03/18/2014/Kim[place]  PAP: Status post hysterectomy  Bone density: Never    Allergies  Allergen Reactions  . Codeine Nausea Only    Current Outpatient Prescriptions  Medication Sig Dispense Refill  . albuterol (PROVENTIL,VENTOLIN) 90 MCG/ACT inhaler Inhale 2 puffs into the lungs every 4 (four) hours as needed. 17 g 3  . levothyroxine (SYNTHROID, LEVOTHROID) 112 MCG tablet TAKE ONE TABLET BY MOUTH ONCE DAILY 30 tablet 0  . lisinopril-hydrochlorothiazide (PRINZIDE,ZESTORETIC) 20-25 MG per tablet TAKE ONE TABLET BY MOUTH ONCE DAILY (Patient taking differently: Take 0.5 tablets by mouth daily. TAKE ONE TABLET BY MOUTH ONCE DAILY) 60 tablet 0  . METOPROLOL SUCCINATE ER PO Take by mouth.    Marland Kitchen omeprazole (PRILOSEC) 20 MG capsule Take 20 mg by mouth daily.    . simvastatin (ZOCOR) 20 MG tablet Take 1 tablet (20 mg total) by mouth at bedtime. 90 tablet 0  . imipramine (TOFRANIL) 50 MG tablet Take 4 tablets (200 mg total) by mouth at bedtime. 360 tablet 0   No current facility-administered medications for this visit.     OBJECTIVE: Middle-aged white woman in no acute distress Vitals:   02/07/17 0841  BP: (!) 135/53  Pulse: 79    Resp: 18  Temp: 97.7 F (36.5 C)     Body mass index is 35.64 kg/m.    ECOG FS:0 - Asymptomatic  Ocular: Sclerae unicteric, pupils equal, round and reactive to light Ear-nose-throat: Oropharynx clear and moist Lymphatic: No cervical or supraclavicular adenopathy Lungs no rales or rhonchi, good excursion bilaterally Heart regular rate and rhythm, no murmur appreciated Abd soft, obese, nontender, positive bowel sounds MSK no focal spinal tenderness, no joint edema Neuro: non-focal, well-oriented, appropriate affect Breasts: The right nipple is minimally inverted. I do not palpate a well-defined mass but the breast is somewhat lumpy overall. There are no skin changes other than those related to the recent biopsy. Right axilla is benign. Left breast is unremarkable   LAB RESULTS:  CMP     Component Value Date/Time   NA 139 02/07/2017 0825   K 3.8 02/07/2017 0825   CL 96 02/12/2013 1536   CO2 25 02/07/2017 0825   GLUCOSE 73 02/07/2017 0825   BUN 20.2 02/07/2017 0825   CREATININE 1.3 (H) 02/07/2017 0825   CALCIUM 10.0 02/07/2017 0825   PROT 8.0 02/07/2017 0825   ALBUMIN 3.8 02/07/2017 0825   AST 13 02/07/2017 0825   ALT 18 02/07/2017 0825   ALKPHOS 65 02/07/2017 0825   BILITOT 0.39 02/07/2017 0825  GFRAA 0.4 01/21/2010 2117    INo results found for: SPEP, UPEP  Lab Results  Component Value Date   WBC 7.9 02/07/2017   NEUTROABS 5.2 02/07/2017   HGB 13.8 02/07/2017   HCT 40.9 02/07/2017   MCV 92.1 02/07/2017   PLT 292 02/07/2017      Chemistry      Component Value Date/Time   NA 139 02/07/2017 0825   K 3.8 02/07/2017 0825   CL 96 02/12/2013 1536   CO2 25 02/07/2017 0825   BUN 20.2 02/07/2017 0825   CREATININE 1.3 (H) 02/07/2017 0825      Component Value Date/Time   CALCIUM 10.0 02/07/2017 0825   ALKPHOS 65 02/07/2017 0825   AST 13 02/07/2017 0825   ALT 18 02/07/2017 0825   BILITOT 0.39 02/07/2017 0825       No results found for: LABCA2  No  components found for: LABCA125  No results for input(s): INR in the last 168 hours.  Urinalysis No results found for: COLORURINE, APPEARANCEUR, LABSPEC, PHURINE, GLUCOSEU, HGBUR, BILIRUBINUR, KETONESUR, PROTEINUR, UROBILINOGEN, NITRITE, LEUKOCYTESUR   STUDIES: Outside films discussed with patient  ELIGIBLE FOR AVAILABLE RESEARCH PROTOCOL: no  ASSESSMENT: 71 y.o.[place], [place]  Woman status post biopsy of 2 separate masses in the central right breast 01/29/2017, showing a clinical mT2 N0 invasive ductal carcinoma, stage IB in the new classification: Both masses being estrogen receptor and progesterone receptor positive, HER-2 not amplified, with an MIB-1 between 5 and 10%.  (1) definitive surgery pending, with mastectomy/sentinel lymph node plan  (2) Oncotype to be obtained from the definitive surgical sample  (3) adjuvant radiation as appropriate  (4) adjuvant anti-estrogens to follow at the completion of local treatment   (5) genetics testing for strong family history of colon cancer?   PLAN: We spent the better part of todayWaters hour-long appointment discussing the biology of breast cancer in general, and the specifics of the patientWaters tumor in particular. We first reviewed the fact that cancer is not one disease but more than 100 different diseases and that it is important to keep them separate-- otherwise when friends and relatives discuss their own cancer experiences with [place] confusion can result. Similarly we explained that if breast cancer spreads to the bone or liver, the patient would not have bone cancer or liver cancer, but breast cancer in the bone and breast cancer in the liver: one cancer in three places-- not 3 different cancers which otherwise would have to be treated in 3 different ways.  We discussed the difference between local and systemic therapy. In terms of loco-regional treatment, lumpectomy plus radiation is equivalent to mastectomy as far as survival is  concerned. However, in Kimber[place] WatersWaters case, with multicentric disease involving the nipple, we do not feel an adequate cosmetic residual could be achieved with lumpectomies and mastectomy with sentinel lymph node is recommended.  We then discussed the rationale for systemic therapy. There is some risk that this cancer may have already spread to other parts of her body. Patients frequently ask at this point about bone scans, CAT scans and PET scans to find out if they have occult breast cancer somewhere else. The problem is that in early stage disease we are much more likely to find false positives then true cancers and this would expose the patient to unnecessary procedures as well as unnecessary radiation. Scans cannot answer the question the patient really would like to know, which is whether she has microscopic disease elsewhere in her body. For  those reasons we do not recommend them.  Of course we would proceed to aggressive evaluation of any symptoms that might suggest metastatic disease, but that is not the case here.  Next we went over the options for systemic therapy which are anti-estrogens, anti-HER-2 immunotherapy, and chemotherapy. Kimberly Waters does not meet criteria for anti-HER-2 immunotherapy. She is a good candidate for anti-estrogens.  The question of chemotherapy is more complicated. Chemotherapy is most effective in rapidly growing, aggressive tumors. It is much less effective in low-grade, slow growing cancers, like [place]. For that reason we are going to request an Oncotype from the definitive surgical sample, as suggested by NCCN guidelines. That will help [place] make a definitive decision regarding chemotherapy in this case.  Accordingly the overall plan is to start with surgery, review the Oncotype, follow-up with chemotherapy and radiation as appropriate (possibly neither would be necessary) and then start antiestrogenWaters at the completion of local treatment.  Sicily does not meet criteria for breast  cancer genetics testing but there is a significant colon cancer history and I am inquiring of our genetics counselors whether she should be tested for a colon cancer related mutation.  Cortnie has a good understanding of the overall plan. She agrees with it. She knows the goal of treatment in her case is cure. She will call with any problems that may develop before her next visitwith me, tentatively scheduled for approximately a month.   Kimberly Cruel, MD   02/08/2017 7:44 AM Medical Oncology and Hematology [facility] [address] [place], [place] [address] Tel. [phone]    Fax. [phone]

## 2017-02-07 NOTE — Progress Notes (Signed)
Nutrition Assessment  Reason for Assessment:  Pt seen in Breast Clinic  ASSESSMENT:   71 year old female with new diagnosis of right breast mass.  Past medical history of hypothyroid, HTN, HLD, reflux  Patient reports appetite has been decreased for the last week but not really sure why, question news regarding recent diagnosis of breast cancer.    Medications:  omeprazole  Labs: creatine 1.3  Anthropometrics:   Height: 61 inches Weight: 188 lb BMI: 35.7   NUTRITION DIAGNOSIS: Food and nutrition related knowledge deficit related to new diagnosis of breast cancer as evidenced by no prior need for nutrition related information.  INTERVENTION:  Discussed and provided packet of information regarding nutritional tips for breast cancer patients.  Questions answered.  Teachback method used.  Contact information given.     MONITORING, EVALUATION, and GOAL: Pt will consume a healthy plant based diet to maintain lean body mass throughout treatment.   Fahed Morten B. Zenia Resides, [place], [place] (pager)

## 2017-02-08 ENCOUNTER — Ambulatory Visit: Payer: Medicare Other

## 2017-02-08 DIAGNOSIS — Z170 Estrogen receptor positive status [ER+]: Secondary | ICD-10-CM | POA: Diagnosis not present

## 2017-02-08 DIAGNOSIS — C50411 Malignant neoplasm of upper-outer quadrant of right female breast: Secondary | ICD-10-CM

## 2017-02-08 DIAGNOSIS — R293 Abnormal posture: Secondary | ICD-10-CM | POA: Diagnosis not present

## 2017-02-08 DIAGNOSIS — M25511 Pain in right shoulder: Secondary | ICD-10-CM | POA: Diagnosis not present

## 2017-02-08 DIAGNOSIS — M25611 Stiffness of right shoulder, not elsewhere classified: Secondary | ICD-10-CM

## 2017-02-08 DIAGNOSIS — G8929 Other chronic pain: Secondary | ICD-10-CM | POA: Diagnosis not present

## 2017-02-08 DIAGNOSIS — M25612 Stiffness of left shoulder, not elsewhere classified: Secondary | ICD-10-CM

## 2017-02-08 NOTE — Therapy (Deleted)
[place], [place], [address] Phone: [phone]   Fax:  [phone]  Physical Therapy Treatment  Patient Details  Name: Kimberly Waters MRN: [id number] Date of Birth: 29-Apr-1946 Referring Provider: Dr. Excell Seltzer  Encounter Date: 02/08/2017      PT End of Session - 02/08/17 1406    Visit Number 2   Number of Visits 8   Date for PT Re-Evaluation 03/07/17   PT Start Time 1306   PT Stop Time 1401   PT Time Calculation (min) 55 min   Activity Tolerance Patient tolerated treatment well   Behavior During Therapy [facility] for tasks assessed/performed      Past Medical History:  Diagnosis Date  . Allergy   . Depression   . GERD (gastroesophageal reflux disease)   . Hyperlipidemia   . Hypertension   . Thyroid disease     Past Surgical History:  Procedure Laterality Date  . ABDOMINAL HYSTERECTOMY    . KNEE SURGERY    . SHOULDER SURGERY      There were no vitals filed for this visit.      Subjective Assessment - 02/08/17 1311    Subjective I still haven't quite got my head together from Breast Clinic yesterday. I'll be glad to have this behind me! (surgery)    Pertinent History Patient was diagnosed 01/25/17 with right grade 1-2 invasive ductal carcinoma breast cancer. There are 2 masses both located in the upper outer quadrant measuring 2.2 cm and 2.5 cm. They are both ER/PR positive and HER2 negative with a Ki67 of 5-10%.    Patient Stated Goals reduce lymphedema risk and learn post op shoulder ROM HEP   Currently in Pain? No/denies               LYMPHEDEMA/ONCOLOGY QUESTIONNAIRE - 02/07/17 1219      Type   Cancer Type Right breast cancer     Lymphedema Assessments   Lymphedema Assessments Upper extremities     Right Upper Extremity Lymphedema   10 cm Proximal to Olecranon Process 33.6 cm   Olecranon Process 27.2 cm   10 cm Proximal to Ulnar Styloid Process 24.7 cm   Just  Proximal to Ulnar Styloid Process 18.2 cm   Across Hand at [employer] 20.6 cm   At [place] of 2nd Digit 6.9 cm     Left Upper Extremity Lymphedema   10 cm Proximal to Olecranon Process 33.8 cm   Olecranon Process 26.5 cm   10 cm Proximal to Ulnar Styloid Process 24.2 cm   Just Proximal to Ulnar Styloid Process 18.5 cm   Across Hand at [employer] 19.8 cm   At [place] of 2nd Digit 6.4 cm                  [facility] Adult PT Treatment/Exercise - 02/08/17 0001      Shoulder Exercises: Pulleys   Flexion 2 minutes   Flexion Limitations Pt returned correct demonstration   ABduction 2 minutes   ABduction Limitations Pt returned correct threapist demonstration but required VC to derease trunk side lean     Shoulder Exercises: Therapy Ball   Flexion 10 reps  With forward lean into top of stretch   Flexion Limitations VC to decrease shoulder hiking     Shoulder Exercises: ROM/Strengthening   Pendulum Attempted this for bil UE's with front-back and side-side motions. Pt with great difficulty relaxing UE's, though did some better with Rt  Shoulder Exercises: Stretch   Corner Stretch 3 reps;10 seconds  In Administrator, arts Limitations Pt returned therapist demonstration, then VC to relax shoulders and hold stretch full 10 seconds.                 PT Education - 02/08/17 1405    Education provided Yes   Education Details Pendulums and doorway pectoralis stretch   Person(s) Educated Patient   Methods Explanation;Demonstration   Comprehension Verbalized understanding;Returned demonstration;Need further instruction  Further instruction for pendulums              [facility] Goals - 2017-03-08 1232      CC Long Term Goal  #1   Title Patient will be independent with her home exercise program for shoulder ROM   Time 4   Period Weeks   Status New     CC Long Term Goal  #2   Title Increase right shoulder active flexion to >/= 140 and abduction to  >/= 110 degrees for increased ease of motion and for better positioning during surgery.   Time 4   Period Weeks   Status New     CC Long Term Goal  #3   Title Increase left shoulder active flexion to >/= 125 degrees and abduction to >/= 120 degrees for increased ease of motion and for better positioning during surgery.   Time 4   Period Weeks   Status New     CC Long Term Goal  #4   Title Patient will report >/= 25% less pain in right shoulder to tolerate daily tasks with greater ease.   Time 4   Period Weeks   Status New            Plan - 02/08/17 1407    Clinical Impression Statement Pt did very well with full initial session of stretching and AA/ROM exercises. She does require multiple VC to relax but reports this has been harder for her lately as she and her sister share caregiving responsibility of their mother where she requires help with transfers. Pt reports this has been making her Rt shoulder feel worse lately, especially when her mom falls. But she reports it feeling good after session today and she plans to begin the exercises issued to her yesterday.    Rehab Potential Good   Clinical Impairments Affecting Rehab Potential Pre-existing shoulder deficits   PT Frequency 2x / week   PT Duration 4 weeks   PT Treatment/Interventions Patient/family education;Therapeutic exercise;ADLs/Self Care Home Management;Electrical Stimulation;Iontophoresis 90m/ml Dexamethasone;Moist Heat;Therapeutic activities;Passive range of motion;Manual techniques;Vasopneumatic Device  Modality plan will change post operatively   PT Next Visit Plan Cont PROM, pulleys, ball up wall, modalities prn as indicated above. Begin ionto to Rt shoulder once order is signed.    PT Home Exercise Plan Post op shoulder ROM HEP   Consulted and Agree with Plan of Care Patient      Patient will benefit from skilled therapeutic intervention in order to improve the following deficits and impairments:  Postural  dysfunction, Impaired flexibility, Decreased knowledge of precautions, Pain, Impaired UE functional use, Decreased range of motion  Visit Diagnosis: Carcinoma of upper-outer quadrant of right breast in female, estrogen receptor positive (HCC)  Abnormal posture  Chronic right shoulder pain  Stiffness of right shoulder, not elsewhere classified  Stiffness of left shoulder, not elsewhere classified       G-Codes - 003/15/181244    Functional Assessment Tool Used Clinical Judgement  Functional Limitation Other PT primary   Other PT Primary Current Status (Q1975) At least 40 percent but less than 60 percent impaired, limited or restricted   Other PT Primary Goal Status (O8325) At least 20 percent but less than 40 percent impaired, limited or restricted      Problem List Patient Active Problem List   Diagnosis Date Noted  . Malignant neoplasm of upper-outer quadrant of right breast in female, estrogen receptor positive ([place]) 02/06/2017  . Hypotension 12/31/2012  . Asthma exacerbation 12/30/2012  . Dehydration 12/30/2012  . Viral gastroenteritis 12/30/2012  . Vitamin D deficiency 04/16/2012  . ASTHMA, PERSISTENT 04/24/2007  . HYPOTHYROIDISM, UNSPECIFIED 02/21/2007  . HYPERTRIGLYCERIDEMIA 02/21/2007  . HYPERLIPIDEMIA 02/21/2007  . DEPRESSION, MAJOR, RECURRENT 02/21/2007  . TOBACCO DEPENDENCE 02/21/2007  . HYPERTENSION, BENIGN SYSTEMIC 02/21/2007  . SINUSITIS, CHRONIC, NOS 02/21/2007  . RHINITIS, ALLERGIC 02/21/2007  . DIVERTICULOSIS OF COLON 02/21/2007  . HEARTBURN 02/21/2007  . INCONTINENCE, URGE 02/21/2007    Otelia Limes, PTA 02/08/2017, 2:16 PM  [place], [place], [address] Phone: [phone]   Fax:  [phone]  Name: ADAIAH JASKOT MRN: [id number] Date of Birth: November 16, 1946

## 2017-02-08 NOTE — Addendum Note (Signed)
Addended by: Chauncey Cruel on: 02/08/2017 08:56 AM   Modules accepted: Orders

## 2017-02-12 ENCOUNTER — Telehealth: Payer: Self-pay | Admitting: *Deleted

## 2017-02-12 NOTE — Telephone Encounter (Signed)
  Oncology Nurse Navigator Documentation  Navigator Location: [facility] (02/12/17 1200)   )Navigator Encounter Type: Telephone (02/12/17 1200) Telephone: [facility] Call;Clinic/[facility]C Follow-up (02/12/17 1200)       Genetic Counseling Date: 02/14/17 (02/12/17 1200) Genetic Counseling Type: Non-Urgent (02/12/17 1200)                                        Time Spent with Patient: 15 (02/12/17 1200)

## 2017-02-13 ENCOUNTER — Ambulatory Visit: Payer: Medicare Other

## 2017-02-13 DIAGNOSIS — R293 Abnormal posture: Secondary | ICD-10-CM | POA: Diagnosis not present

## 2017-02-13 DIAGNOSIS — C50411 Malignant neoplasm of upper-outer quadrant of right female breast: Secondary | ICD-10-CM | POA: Diagnosis not present

## 2017-02-13 DIAGNOSIS — M25611 Stiffness of right shoulder, not elsewhere classified: Secondary | ICD-10-CM

## 2017-02-13 DIAGNOSIS — G8929 Other chronic pain: Secondary | ICD-10-CM

## 2017-02-13 DIAGNOSIS — M25612 Stiffness of left shoulder, not elsewhere classified: Secondary | ICD-10-CM

## 2017-02-13 DIAGNOSIS — M25511 Pain in right shoulder: Secondary | ICD-10-CM

## 2017-02-13 DIAGNOSIS — Z170 Estrogen receptor positive status [ER+]: Secondary | ICD-10-CM | POA: Diagnosis not present

## 2017-02-13 NOTE — Therapy (Signed)
[place], [place], [address] Phone: [phone]   Fax:  [phone]  Physical Therapy Treatment  Patient Details  Name: Kimberly Waters MRN: [id number] Date of Birth: 09/27/1946 Referring Provider: Dr. Excell Seltzer  Encounter Date: 02/13/2017      PT End of Session - 02/13/17 1224    Visit Number 3   Number of Visits 8   Date for PT Re-Evaluation 03/07/17   PT Start Time 1011   PT Stop Time 1102   PT Time Calculation (min) 51 min   Activity Tolerance Patient tolerated treatment well   Behavior During Therapy [facility] for tasks assessed/performed      Past Medical History:  Diagnosis Date  . Allergy   . Depression   . GERD (gastroesophageal reflux disease)   . Hyperlipidemia   . Hypertension   . Thyroid disease     Past Surgical History:  Procedure Laterality Date  . ABDOMINAL HYSTERECTOMY    . KNEE SURGERY    . SHOULDER SURGERY      There were no vitals filed for this visit.      Subjective Assessment - 02/13/17 1015    Subjective I'll be honest, I haven't done any stretches since the last visit bc I've just been feeling depressed. But I felt so good after last session! And my Rt shoulder just hurts me all the time but it just has to wait until I finish with all of my cancer treatments and surgery.    Pertinent History Patient was diagnosed 01/25/17 with right grade 1-2 invasive ductal carcinoma breast cancer. There are 2 masses both located in the upper outer quadrant measuring 2.2 cm and 2.5 cm. They are both ER/PR positive and HER2 negative with a Ki67 of 5-10%.    Patient Stated Goals reduce lymphedema risk and learn post op shoulder ROM HEP   Currently in Pain? No/denies                         [facility] Adult PT Treatment/Exercise - 02/13/17 0001      Shoulder Exercises: Pulleys   Flexion 2 minutes   Flexion Limitations VC to decrease scapular compensation    ABduction 2 minutes   ABduction Limitations VC to decrease scapular compensation     Shoulder Exercises: Therapy Ball   Flexion 10 reps  With forward lean into top of stretch   Flexion Limitations VC to decrease shoulder hiking     Shoulder Exercises: ROM/Strengthening   Other ROM/Strengthening Exercises Finger Ladder for bil UE abduction 8 times each UE with main focus being to decrease scapular compensations which pt required tactile and VC for throughout     Shoulder Exercises: Isometric Strengthening   Flexion 3X5"  Standing in doorway; bil UE's   Flexion Limitations VC to decrease trunk forward lean   Extension 3X5"  Standing in doorway; bil UE's   Extension Limitations Pt required VC throughout for correct technique   External Rotation 3X5"  Standing in doorway with bil UE's   External Rotation Limitations Placed piece of paper between elbow and waist to provide pt with cuing to keep her elbow at her side.    ABduction 3X5"  Standing in doorway with bil UE's   ABduction Limitations Pt returned correct demonstration of this     Modalities   Modalities Iontophoresis     Iontophoresis   Type of Iontophoresis Dexamethasone   Location anterior Rt shoulder  capsule   Dose 1 mL   Time 4-6 hr wear     Manual Therapy   Passive ROM In Supine with HOB slightly elevated: To Rt shoulder into flexion, abduction, er and IR all to pts tolerance.                 PT Education - 02/13/17 1223    Education provided Yes   Education Details Reissued HEP sheet pt [facility] and instructed in isometrics of bil UE's. Also verbally instructed pt to remove patch in 4-6 hrs and sooner if she feels she is having a reaction. Also instructed in precautions of patch.    Person(s) Educated Patient   Methods Explanation;Demonstration;Handout   Comprehension Verbalized understanding;Returned demonstration;Need further instruction;Verbal cues  required;Tactile cues required              [place] - 02/07/17 1232      CC Long Term Goal  #1   Title Patient will be independent with her home exercise program for shoulder ROM   Time 4   Period Weeks   Status New     CC Long Term Goal  #2   Title Increase right shoulder active flexion to >/= 140 and abduction to >/= 110 degrees for increased ease of motion and for better positioning during surgery.   Time 4   Period Weeks   Status New     CC Long Term Goal  #3   Title Increase left shoulder active flexion to >/= 125 degrees and abduction to >/= 120 degrees for increased ease of motion and for better positioning during surgery.   Time 4   Period Weeks   Status New     CC Long Term Goal  #4   Title Patient will report >/= 25% less pain in right shoulder to tolerate daily tasks with greater ease.   Time 4   Period Weeks   Status New            Plan - 02/13/17 1225    Clinical Impression Statement Pt reports feeling very good after last session last week but did not do her any of her HEP because she reports not wanting to look in the folder she received from [facility] due to it making her feel more depressed. Encouraged pt to continue exercising her shoulders so she will be in best shape possible before surgery (which she is anxious about bc she just wants it over). Reissued HEP from last week and started pt on gentle isometric strengthening which she reported helped her feel alot better. Also started iontophoresis today as her order has been signed by Dr. Excell Seltzer.    Rehab Potential Good   Clinical Impairments Affecting Rehab Potential Pre-existing shoulder deficits   PT Frequency 2x / week   PT Duration 4 weeks   PT Treatment/Interventions Patient/family education;Therapeutic exercise;ADLs/Self Care Home Management;Electrical Stimulation;Iontophoresis 66m/ml Dexamethasone;Moist Heat;Therapeutic activities;Passive range of motion;Manual  techniques;Vasopneumatic Device   PT Next Visit Plan Cont PROM, pulleys, ball up wall, modalities prn as indicated above. Assess ionto to Rt shoulder and cont if pt tolerated well.    PT Home Exercise Plan Post op shoulder ROM HEP and isometric strengthening   Consulted and Agree with Plan of Care Patient      Patient will benefit from skilled therapeutic intervention in order to improve the following deficits and impairments:  Postural dysfunction, Impaired flexibility, Decreased knowledge of precautions, Pain,  Impaired UE functional use, Decreased range of motion  Visit Diagnosis: Abnormal posture  Chronic right shoulder pain  Stiffness of right shoulder, not elsewhere classified  Stiffness of left shoulder, not elsewhere classified     Problem List Patient Active Problem List   Diagnosis Date Noted  . Malignant neoplasm of upper-outer quadrant of right breast in female, estrogen receptor positive ([place]) 02/06/2017  . Hypotension 12/31/2012  . Asthma exacerbation 12/30/2012  . Dehydration 12/30/2012  . Viral gastroenteritis 12/30/2012  . Vitamin D deficiency 04/16/2012  . ASTHMA, PERSISTENT 04/24/2007  . HYPOTHYROIDISM, UNSPECIFIED 02/21/2007  . HYPERTRIGLYCERIDEMIA 02/21/2007  . HYPERLIPIDEMIA 02/21/2007  . DEPRESSION, MAJOR, RECURRENT 02/21/2007  . TOBACCO DEPENDENCE 02/21/2007  . HYPERTENSION, BENIGN SYSTEMIC 02/21/2007  . SINUSITIS, CHRONIC, NOS 02/21/2007  . RHINITIS, ALLERGIC 02/21/2007  . DIVERTICULOSIS OF COLON 02/21/2007  . HEARTBURN 02/21/2007  . INCONTINENCE, URGE 02/21/2007    Otelia Limes, PTA 02/13/2017, 12:32 PM  [place], [place], [address] Phone: [phone]   Fax:  [phone]  Name: Kimberly Waters MRN: [id number] Date of Birth: 04/16/1946

## 2017-02-13 NOTE — Therapy (Addendum)
[place], [place], [address] Phone: [phone]   Fax:  [phone]  Physical Therapy Treatment  Patient Details  Name: Kimberly Waters MRN: [id number] Date of Birth: [birth year] Referring Provider: Dr. Excell Seltzer  Encounter Date: 02/08/2017      PT End of Session - 02/13/17 1224    Visit Number 2   Number of Visits 8   Date for PT Re-Evaluation 03/07/17   PT Start Time 1306   PT Stop Time 1401   PT Time Calculation (min) 55 min   Activity Tolerance Patient tolerated treatment well   Behavior During Therapy [facility] for tasks assessed/performed      Past Medical History:  Diagnosis Date  . Allergy   . Depression   . GERD (gastroesophageal reflux disease)   . Hyperlipidemia   . Hypertension   . Thyroid disease     Past Surgical History:  Procedure Laterality Date  . ABDOMINAL HYSTERECTOMY    . KNEE SURGERY    . SHOULDER SURGERY      There were no vitals filed for this visit.      Subjective Assessment - 02/08/17 1015    Subjective I still haven't quite got my head together from [facility]. I'll be glad to have this behind me! (surgery)   Pertinent History Patient was diagnosed 01/25/17 with right grade 1-2 invasive ductal carcinoma breast cancer. There are 2 masses both located in the upper outer quadrant measuring 2.2 cm and 2.5 cm. They are both ER/PR positive and HER2 negative with a Ki67 of 5-10%.    Patient Stated Goals reduce lymphedema risk and learn post op shoulder ROM HEP   Currently in Pain? No/denies                   [facility] Adult PT Treatment/Exercise - 02/08/2017 0001      Shoulder Exercises: Pulleys   Flexion 2 minutes   Flexion Limitations Pt returned correct demonstration   ABduction 2 minutes   ABduction Limitations Pt returned correct threapist demonstration but required VC to derease trunk side lean     Shoulder Exercises: Therapy Ball   Flexion 10 reps  With forward lean into top of stretch   Flexion Limitations VC to decrease shoulder hiking     Shoulder Exercises: ROM/Strengthening   Pendulum Attempted this for bil UE's with front-back and side-side motions. Pt with great difficulty relaxing UE's, though did some better with Rt     Shoulder Exercises: Stretch   Corner Stretch 3 reps;10 seconds  In Administrator, arts Limitations Pt returned therapist demonstration, then VC to relax shoulders and hold stretch full 10 seconds.      Manual Therapy   Passive ROM In Supine with HOB slightly elevated to bil shoulders into flexion and abduction and er all to pts tolerance. Pt required multiple VC to relax throughout.                       [facility] Goals - 02/07/17 1232      CC Long Term Goal  #1   Title Patient will be independent with her home exercise program for shoulder ROM   Time 4   Period Weeks   Status New     CC Long Term Goal  #2   Title Increase right shoulder active flexion to >/= 140 and abduction to >/= 110 degrees for increased ease of motion  and for better positioning during surgery.   Time 4   Period Weeks   Status New     CC Long Term Goal  #3   Title Increase left shoulder active flexion to >/= 125 degrees and abduction to >/= 120 degrees for increased ease of motion and for better positioning during surgery.   Time 4   Period Weeks   Status New     CC Long Term Goal  #4   Title Patient will report >/= 25% less pain in right shoulder to tolerate daily tasks with greater ease.   Time 4   Period Weeks   Status New            Plan - 02/13/17 1225    Clinical Impression Statement Pt did very well with full initial session of stretching and AA/ROM exercises. She does require multiple VC to relax but reports this has been harder for her lately as she and her sister share caregiving responsibility of their mother where she requires help with transfers. Pt reports  this has been making her Rt shoulder feel worse lately, especially when her mom falls. But she reports it feeling good after session today and she plans to begin the exercises issued to her yesterday.   Rehab Potential Good   Clinical Impairments Affecting Rehab Potential Pre-existing shoulder deficits   PT Frequency 2x / week   PT Duration 4 weeks   PT Treatment/Interventions Patient/family education;Therapeutic exercise;ADLs/Self Care Home Management;Electrical Stimulation;Iontophoresis 55m/ml Dexamethasone;Moist Heat;Therapeutic activities;Passive range of motion;Manual techniques;Vasopneumatic Device   PT Next Visit Plan Cont PROM, pulleys, ball up wall, modalities prn as indicated above. Begin ionto to Rt shoulder once is signed.    PT Home Exercise Plan Post op shoulder ROM HEP and isometric strengthening   Consulted and Agree with Plan of Care Patient      Patient will benefit from skilled therapeutic intervention in order to improve the following deficits and impairments:  Postural dysfunction, Impaired flexibility, Decreased knowledge of precautions, Pain, Impaired UE functional use, Decreased range of motion  Visit Diagnosis: Carcinoma of upper-outer quadrant of right breast in female, estrogen receptor positive ([facility])  Abnormal posture  Chronic right shoulder pain  Stiffness of right shoulder, not elsewhere classified  Stiffness of left shoulder, not elsewhere classified     Problem List Patient Active Problem List   Diagnosis Date Noted  . Malignant neoplasm of upper-outer quadrant of right breast in female, estrogen receptor positive ([place] 02/06/2017  . Hypotension 12/31/2012  . Asthma exacerbation 12/30/2012  . Dehydration 12/30/2012  . Viral gastroenteritis 12/30/2012  . Vitamin D deficiency 04/16/2012  . ASTHMA, PERSISTENT 04/24/2007  . HYPOTHYROIDISM, UNSPECIFIED 02/21/2007  . HYPERTRIGLYCERIDEMIA 02/21/2007  . HYPERLIPIDEMIA 02/21/2007  . DEPRESSION, MAJOR,  RECURRENT 02/21/2007  . TOBACCO DEPENDENCE 02/21/2007  . HYPERTENSION, BENIGN SYSTEMIC 02/21/2007  . SINUSITIS, CHRONIC, NOS 02/21/2007  . RHINITIS, ALLERGIC 02/21/2007  . DIVERTICULOSIS OF COLON 02/21/2007  . HEARTBURN 02/21/2007  . INCONTINENCE, URGE 02/21/2007    ROtelia Limes PTA 02/08/2017, 12:39 PM  [place] [address]: [phone]  Fax:  [phone] Name: GDELORISE HUNKELEMRN: [id number] of Birth: 1Feb 16, 1947

## 2017-02-13 NOTE — Patient Instructions (Signed)
Flexion (Isometric)      Cancer Rehab [phone]    Press right fist against wall. Hold __5__ seconds. Repeat _5-10___ times. Do __1-2__ sessions per day.  SHOULDER: Abduction (Isometric)    Use wall as resistance. Press arm against pillow. Hold _5__ seconds. _5-10__ times. Do _1-2__ sessions per day.    External Rotation (Isometric)    Place back of left fist against door frame, with elbow bent. Press fist against door frame. Hold __5__ seconds. May need to put small pillow between elbow and waist. Repeat _5-10___ times. Do _1-2___ sessions per day.  Extension (Isometric)    Place left bent elbow and back of arm against wall. Press elbow against wall. Hold __5__ seconds. Repeat _5-10___ times. Do _1-2___ sessions per day.

## 2017-02-14 ENCOUNTER — Other Ambulatory Visit (HOSPITAL_BASED_OUTPATIENT_CLINIC_OR_DEPARTMENT_OTHER): Payer: Medicare Other

## 2017-02-14 ENCOUNTER — Ambulatory Visit (HOSPITAL_BASED_OUTPATIENT_CLINIC_OR_DEPARTMENT_OTHER): Payer: Medicare Other | Admitting: Genetic Counselor

## 2017-02-14 DIAGNOSIS — Z803 Family history of malignant neoplasm of breast: Secondary | ICD-10-CM

## 2017-02-14 DIAGNOSIS — C50411 Malignant neoplasm of upper-outer quadrant of right female breast: Secondary | ICD-10-CM

## 2017-02-14 DIAGNOSIS — Z801 Family history of malignant neoplasm of trachea, bronchus and lung: Secondary | ICD-10-CM

## 2017-02-14 DIAGNOSIS — Z170 Estrogen receptor positive status [ER+]: Principal | ICD-10-CM

## 2017-02-14 DIAGNOSIS — C50811 Malignant neoplasm of overlapping sites of right female breast: Secondary | ICD-10-CM

## 2017-02-14 DIAGNOSIS — Z800 Family history of malignant neoplasm of digestive organs: Secondary | ICD-10-CM | POA: Diagnosis not present

## 2017-02-14 DIAGNOSIS — Z315 Encounter for genetic counseling: Secondary | ICD-10-CM

## 2017-02-14 DIAGNOSIS — Z808 Family history of malignant neoplasm of other organs or systems: Secondary | ICD-10-CM

## 2017-02-14 DIAGNOSIS — Z86010 Personal history of colonic polyps: Secondary | ICD-10-CM | POA: Diagnosis not present

## 2017-02-14 DIAGNOSIS — D126 Benign neoplasm of colon, unspecified: Secondary | ICD-10-CM

## 2017-02-14 LAB — CBC WITH DIFFERENTIAL/PLATELET
BASO%: 0.9 % (ref 0.0–2.0)
Basophils Absolute: 0.1 10*3/uL (ref 0.0–0.1)
EOS%: 0.3 % (ref 0.0–7.0)
Eosinophils Absolute: 0 10*3/uL (ref 0.0–0.5)
HCT: 40 % (ref 34.8–46.6)
HGB: 13.7 g/dL (ref 11.6–15.9)
LYMPH%: 21.9 % (ref 14.0–49.7)
MCH: 31.2 pg (ref 25.1–34.0)
MCHC: 34.3 g/dL (ref 31.5–36.0)
MCV: 91.1 fL (ref 79.5–101.0)
MONO#: 0.8 10*3/uL (ref 0.1–0.9)
MONO%: 7.2 % (ref 0.0–14.0)
NEUT#: 7.4 10*3/uL — ABNORMAL HIGH (ref 1.5–6.5)
NEUT%: 69.7 % (ref 38.4–76.8)
Platelets: 287 10*3/uL (ref 145–400)
RBC: 4.39 10*6/uL (ref 3.70–5.45)
RDW: 13.5 % (ref 11.2–14.5)
WBC: 10.6 10*3/uL — ABNORMAL HIGH (ref 3.9–10.3)
lymph#: 2.3 10*3/uL (ref 0.9–3.3)

## 2017-02-14 LAB — COMPREHENSIVE METABOLIC PANEL
ALT: 17 U/L (ref 0–55)
ANION GAP: 10 meq/L (ref 3–11)
AST: 12 U/L (ref 5–34)
Albumin: 3.8 g/dL (ref 3.5–5.0)
Alkaline Phosphatase: 66 U/L (ref 40–150)
BUN: 21.4 mg/dL (ref 7.0–26.0)
CHLORIDE: 103 meq/L (ref 98–109)
CO2: 25 meq/L (ref 22–29)
CREATININE: 1.4 mg/dL — AB (ref 0.6–1.1)
Calcium: 9.7 mg/dL (ref 8.4–10.4)
EGFR: 38 mL/min/{1.73_m2} — ABNORMAL LOW (ref >90)
Glucose: 110 mg/dl (ref 70–140)
POTASSIUM: 3.9 meq/L (ref 3.5–5.1)
Sodium: 139 mEq/L (ref 136–145)
Total Bilirubin: 0.37 mg/dL (ref 0.20–1.20)
Total Protein: 7.8 g/dL (ref 6.4–8.3)

## 2017-02-14 NOTE — Progress Notes (Signed)
   02/08/17 0001  Shoulder Exercises: Pulleys  Flexion 2 minutes  Flexion Limitations Pt returned correct demonstration  ABduction 2 minutes  ABduction Limitations Pt returned correct threapist demonstration but required VC to derease trunk side lean  Shoulder Exercises: Therapy Ball  Flexion 10 reps (With forward lean into top of stretch)  Flexion Limitations VC to decrease shoulder hiking  Shoulder Exercises: ROM/Strengthening  Pendulum Attempted this for bil UE's with front-back and side-side motions. Pt with great difficulty relaxing UE's, though did some better with Rt  Shoulder Exercises: Stretch  Corner Stretch 3 reps;10 seconds (In doorway)  Warehouse manager Limitations Pt returned therapist demonstration, then VC to relax shoulders and hold stretch full 10 seconds.   Manual Therapy  Passive ROM In Supine with HOB slightly elevated to bil shoulders into flexion and abduction and er all to pts tolerance. Pt required multiple VC to relax throughout.

## 2017-02-15 ENCOUNTER — Encounter: Payer: Self-pay | Admitting: Genetic Counselor

## 2017-02-15 ENCOUNTER — Ambulatory Visit: Payer: Medicare Other

## 2017-02-15 DIAGNOSIS — M25511 Pain in right shoulder: Secondary | ICD-10-CM | POA: Diagnosis not present

## 2017-02-15 DIAGNOSIS — M25611 Stiffness of right shoulder, not elsewhere classified: Secondary | ICD-10-CM

## 2017-02-15 DIAGNOSIS — M25612 Stiffness of left shoulder, not elsewhere classified: Secondary | ICD-10-CM

## 2017-02-15 DIAGNOSIS — Z800 Family history of malignant neoplasm of digestive organs: Secondary | ICD-10-CM | POA: Insufficient documentation

## 2017-02-15 DIAGNOSIS — Z803 Family history of malignant neoplasm of breast: Secondary | ICD-10-CM | POA: Insufficient documentation

## 2017-02-15 DIAGNOSIS — G8929 Other chronic pain: Secondary | ICD-10-CM | POA: Diagnosis not present

## 2017-02-15 DIAGNOSIS — C50411 Malignant neoplasm of upper-outer quadrant of right female breast: Secondary | ICD-10-CM | POA: Diagnosis not present

## 2017-02-15 DIAGNOSIS — Z170 Estrogen receptor positive status [ER+]: Secondary | ICD-10-CM | POA: Diagnosis not present

## 2017-02-15 DIAGNOSIS — R293 Abnormal posture: Secondary | ICD-10-CM

## 2017-02-15 DIAGNOSIS — K635 Polyp of colon: Secondary | ICD-10-CM | POA: Insufficient documentation

## 2017-02-15 NOTE — Progress Notes (Signed)
REFERRING PROVIDER: Josetta Huddle, MD [address], [place] [address]   Kimberly Del, MD  PRIMARY PROVIDER:  Henrine Screws, MD  PRIMARY REASON FOR VISIT:  1. Malignant neoplasm of upper-outer quadrant of right breast in female, estrogen receptor positive ([place])   2. Adenomatous polyp of colon, unspecified part of colon   3. Family history of colon cancer   4. Family history of breast cancer      HISTORY OF PRESENT ILLNESS:   Kimberly Waters, a 71 y.o. female, was seen for a [facility] cancer genetics consultation at the request of Dr. Jana Hakim due to a personal and family history of cancer.  Kimberly Waters presents to clinic today, with her daughter Kimberly Waters, to discuss the possibility of a hereditary predisposition to cancer, genetic testing, and to further clarify her future cancer risks, as well as potential cancer risks for family members.   In February 2018, at the age of 39, Kimberly Waters was diagnosed with invasive ductal carcinoma of the right breast. This will be treated with double mastectomy.  It is unclear if she will need chemotherapy or radiation.  Her hormone status is ER+.  Kimberly Waters has had 10 colon polyps and is on a 5 year screening schedule. She reports having a TAH-BSO at age 25 due to precancerous polyps on her ovaries and fallopian tubes.     CANCER HISTORY:   No history exists.     HORMONAL RISK FACTORS:  Menarche was at age 66.  First live birth at age 49.  OCP use for approximately 6 years.  Ovaries intact: no.  Hysterectomy: yes.  Menopausal status: postmenopausal.  HRT use: 6 years. Colonoscopy: yes; 10 colon polyps. Mammogram within the last year: yes. Number of breast biopsies: 6. Up to date with pelvic exams:  n/a. Any excessive radiation exposure in the past:  She used to have ear infections as a child and was told that she had radiation treatment for her ear infections.  Past Medical History:  Diagnosis Date  . Allergy    . Colon polyps   . Depression   . Family history of breast cancer   . Family history of colon cancer   . GERD (gastroesophageal reflux disease)   . Hyperlipidemia   . Hypertension   . Thyroid disease     Past Surgical History:  Procedure Laterality Date  . ABDOMINAL HYSTERECTOMY    . KNEE SURGERY    . SHOULDER SURGERY      Social History   Social History  . Marital status: Married    Spouse name: N/A  . Number of children: N/A  . Years of education: N/A   Social History Main Topics  . Smoking status: Current Every Day Smoker    Packs/day: 1.00    Years: 45.00    Types: Cigarettes  . Smokeless tobacco: Never Used  . Alcohol use No  . Drug use: No  . Sexual activity: Not Asked   Other Topics Concern  . None   Social History Narrative  . None     FAMILY HISTORY:  We obtained a detailed, 4-generation family history.  Significant diagnoses are listed below: Family History  Problem Relation Age of Onset  . Colon cancer Maternal Grandfather 65  . Colon cancer Maternal Aunt 60  . Colon cancer Maternal Uncle     dx in his 1s  . Breast cancer Paternal Aunt 80  . Breast cancer Cousin 106    paternal first  cousin  . Lung cancer Paternal Grandfather   . Colon cancer Maternal Uncle   . Head & neck cancer Maternal Uncle     oral cancer    The patient has two children and a sister who are all cancer free.  Her mother is alive at 2 and reportedly has a right breast lump that is the size of a marble.  They are planning to bring this to the attention of Dr. Inda Merlin at her next visit.  The patient's father died at age 29.  Her mother had two brothers and one sister all who died of colon cancer.  Her mother's parents, are deceased, with her grandfather also having colon cancer at age 1.  The patient's father had two sisters and a brother.  One sister had breast cancer at 22. This sister had a daughter with breast cancer at age 62.  The patient's paternal grandfather died of  lung cancer.  Kimberly Waters is unaware of previous family history of genetic testing for hereditary cancer risks. Patient's maternal ancestors are of Caucasian descent, and paternal ancestors are of Caucasian descent. There is no reported Ashkenazi Jewish ancestry. There is no known consanguinity.  GENETIC COUNSELING ASSESSMENT: Kimberly Waters is a 71 y.o. female with a personal history of colon polyps and breast cancer and a family history of colon and breast cancer which is somewhat suggestive of a hereditary cancer syndrome and predisposition to cancer. We, therefore, discussed and recommended the following at today's visit.   DISCUSSION: We discussed that about 5-10% of breast cancer is due to hereditary causes with most cases due to BRCA mutations.  Other hereditary breast cancer is caused by moderate risk genes including ATM, CHEK2 and PALB2.  Sometimes we see breast cancer in the light of Lynch syndrome, Based on the patient's high polyp count, we should consider genetic testing for hereditary colon cancer syndromes, such as Lynch and others.  We reviewed the characteristics, features and inheritance patterns of hereditary cancer syndromes. We also discussed genetic testing, including the appropriate family members to test, the process of testing, insurance coverage and turn-around-time for results. We discussed the implications of a negative, positive and/or variant of uncertain significant result. We recommended Kimberly Waters pursue genetic testing for the Common Hereditary Cancer gene panel. The Hereditary Gene Panel offered by [facility] includes sequencing and/or deletion duplication testing of the following 43 genes: APC, ATM, AXIN2, BARD1, BMPR1A, BRCA1, BRCA2, BRIP1, CDH1, CDKN2A (p14ARF), CDKN2A (p16INK4a), CHEK2, DICER1, EPCAM (Deletion/duplication testing only), GREM1 (promoter region deletion/duplication testing only), KIT, MEN1, MLH1, MSH2, MSH6, MUTYH, NBN, NF1, PALB2, PDGFRA, PMS2, POLD1, POLE,  PTEN, RAD50, RAD51C, RAD51D, SDHB, SDHC, SDHD, SMAD4, SMARCA4. STK11, TP53, TSC1, TSC2, and VHL.  The following gene was evaluated for sequence changes only: SDHA and HOXB13 c.251G>A variant only.   Based on Kimberly Waters's personal and family history of cancer, she meets medical criteria for genetic testing. Despite that she meets criteria, she may still have an out of pocket cost. We discussed that if her out of pocket cost for testing is over $100, the laboratory will call and confirm whether she wants to proceed with testing.  If the out of pocket cost of testing is less than $100 she will be billed by the genetic testing laboratory.   PLAN: After considering the risks, benefits, and limitations, Kimberly Waters  provided informed consent to pursue genetic testing and the blood sample was sent to [facility] for analysis of the Common Hereditary Cancer panel.  Results should be available within approximately 2-3 weeks' time, at which point they will be disclosed by telephone to Kimberly Waters, as will any additional recommendations warranted by these results. Kimberly Waters will receive a summary of her genetic counseling visit and a copy of her results once available. This information will also be available in Epic. We encouraged Kimberly Waters to remain in contact with cancer genetics annually so that we can continuously update the family history and inform her of any changes in cancer genetics and testing that may be of benefit for her family. Kimberly Waters questions were answered to her satisfaction today. Our contact information was provided should additional questions or concerns arise.  Lastly, we encouraged Kimberly Waters to remain in contact with cancer genetics annually so that we can continuously update the family history and inform her of any changes in cancer genetics and testing that may be of benefit for this family.   Ms.  Waters questions were answered to her satisfaction today. Our contact  information was provided should additional questions or concerns arise. Thank you for the referral and allowing [place] to share in the care of your patient.   Karen P. Florene Glen, [place], [facility] Certified Genetic Counselor [email or web address] phone: [phone]  The patient was seen for a total of 60 minutes in face-to-face genetic counseling.  This patient was discussed with Drs. Magrinat, Lindi Adie and/or Burr Medico who agrees with the above.    _______________________________________________________________________ For Office Staff:  Number of people involved in session: 2 Was an Intern/ student involved with case: no

## 2017-02-15 NOTE — Therapy (Addendum)
[place], [place], [address] Phone: [phone]   Fax:  [phone]  Physical Therapy Treatment  Patient Details  Name: Kimberly Waters MRN: [id number] Date of Birth: [birth year] Referring Provider: Dr. Excell Seltzer  Encounter Date: 02/15/2017      PT End of Session - 02/15/17 1104    Visit Number 4   Number of Visits 8   Date for PT Re-Evaluation 03/07/17   PT Start Time 1020   PT Stop Time 1104   PT Time Calculation (min) 44 min   Activity Tolerance Patient tolerated treatment well   Behavior During Therapy [facility] for tasks assessed/performed      Past Medical History:  Diagnosis Date  . Allergy   . Colon polyps   . Depression   . Family history of breast cancer   . Family history of colon cancer   . GERD (gastroesophageal reflux disease)   . Hyperlipidemia   . Hypertension   . Thyroid disease     Past Surgical History:  Procedure Laterality Date  . ABDOMINAL HYSTERECTOMY    . KNEE SURGERY    . SHOULDER SURGERY      There were no vitals filed for this visit.      Subjective Assessment - 02/15/17 1021    Subjective I loved the patch! It helped my pain for at least a day.    Pertinent History Patient was diagnosed 01/25/17 with right grade 1-2 invasive ductal carcinoma breast cancer. There are 2 masses both located in the upper outer quadrant measuring 2.2 cm and 2.5 cm. They are both ER/PR positive and HER2 negative with a Ki67 of 5-10%.    Patient Stated Goals reduce lymphedema risk and learn post op shoulder ROM HEP   Currently in Pain? No/denies            [facility] PT Assessment - 02/15/17 0001      AROM   Right Shoulder Flexion 137 Degrees   Right Shoulder ABduction 128 Degrees                     [facility] Adult PT Treatment/Exercise - 02/15/17 0001      Shoulder Exercises: Pulleys   Flexion 2 minutes   Flexion Limitations VC to decrease scapular  compensation   ABduction 2 minutes   ABduction Limitations VC throughout to decrease Rt scapular compensation     Shoulder Exercises: Therapy Ball   Flexion 10 reps  With forward lean into top of stretch   Flexion Limitations Pt did this with better technique today   ABduction 10 reps  Bil UE with side lean into top of stretch   ABduction Limitations Tactile cues to decrease scapular compensations on Rt      Shoulder Exercises: Isometric Strengthening   Flexion Other (comment)  10 x 10" bil UE's in doorway   Extension Other (comment)  10 x 10" bil UE's in doorway   External Rotation Other (comment)  10 x 10" bil UE's in doorway   ABduction Other (comment)  10 x 10" bil UE's in doorway     Iontophoresis   Type of Iontophoresis Dexamethasone   Location anterior Rt shoulder capsule   Dose 1 mL   Time 4-6 hr wear     Manual Therapy   Passive ROM In Supine with HOB slightly elevated to bil shoulders into flexion and abduction and er all to pts tolerance. Pt required multiple VC to relax throughout.  Breast Clinic Goals - 02/07/17 1232      Patient will be able to verbalize understanding of pertinent lymphedema risk reduction practices relevant to her diagnosis specifically related to skin care.   Time 1   Period Days   Status Achieved     Patient will be able to return demonstrate and/or verbalize understanding of the post-op home exercise program related to regaining shoulder range of motion.   Time 1   Period Days   Status Achieved     Patient will be able to verbalize understanding of the importance of attending the postoperative After Breast Cancer Class for further lymphedema risk reduction education and therapeutic exercise.   Time 1   Period Days   Status Achieved          Long Term Clinic Goals - 02/07/17 1232      CC Long Term Goal  #1   Title Patient will be independent with her home exercise program for shoulder ROM    Time 4   Period Weeks   Status New     CC Long Term Goal  #2   Title Increase right shoulder active flexion to >/= 140 and abduction to >/= 110 degrees for increased ease of motion and for better positioning during surgery.   Time 4   Period Weeks   Status New     CC Long Term Goal  #3   Title Increase left shoulder active flexion to >/= 125 degrees and abduction to >/= 120 degrees for increased ease of motion and for better positioning during surgery.   Time 4   Period Weeks   Status New     CC Long Term Goal  #4   Title Patient will report >/= 25% less pain in right shoulder to tolerate daily tasks with greater ease.   Time 4   Period Weeks   Status New            Plan - 02/15/17 1105    Clinical Impression Statement Pt is doing very well with therapy. Already has made gains with her A/ROM with flexion and abduction and noticing less pain which she contributes some to ionto patch we bagan last session. She is frustrated she hasn't heard about her surgery yet so Kimberly Waters, PT messaged doctor regarding this. Overall pt is doing well with progress of increased ROM for upcoming surgery and then possibly radiation.    Rehab Potential Good   Clinical Impairments Affecting Rehab Potential Pre-existing shoulder deficits   PT Frequency 2x / week   PT Duration 4 weeks   PT Treatment/Interventions Patient/family education;Therapeutic exercise;ADLs/Self Care Home Management;Electrical Stimulation;Iontophoresis 65m/ml Dexamethasone;Moist Heat;Therapeutic activities;Passive range of motion;Manual techniques;Vasopneumatic Device   PT Next Visit Plan Cont PROM, pulleys, ball up wall, modalities prn as indicated above. Cont ionto to Rt shoulder.   Consulted and Agree with Plan of Care Patient      Patient will benefit from skilled therapeutic intervention in order to improve the following deficits and impairments:  Postural dysfunction, Impaired flexibility, Decreased knowledge of  precautions, Pain, Impaired UE functional use, Decreased range of motion  Visit Diagnosis: Abnormal posture  Chronic right shoulder pain  Stiffness of right shoulder, not elsewhere classified  Stiffness of left shoulder, not elsewhere classified     Problem List Patient Active Problem List   Diagnosis Date Noted  . Colon polyps   . Family history of colon cancer   . Family history of breast cancer   . Malignant  neoplasm of upper-outer quadrant of right breast in female, estrogen receptor positive ([place]) 02/06/2017  . Hypotension 12/31/2012  . Asthma exacerbation 12/30/2012  . Dehydration 12/30/2012  . Viral gastroenteritis 12/30/2012  . Vitamin D deficiency 04/16/2012  . ASTHMA, PERSISTENT 04/24/2007  . HYPOTHYROIDISM, UNSPECIFIED 02/21/2007  . HYPERTRIGLYCERIDEMIA 02/21/2007  . HYPERLIPIDEMIA 02/21/2007  . DEPRESSION, MAJOR, RECURRENT 02/21/2007  . TOBACCO DEPENDENCE 02/21/2007  . HYPERTENSION, BENIGN SYSTEMIC 02/21/2007  . SINUSITIS, CHRONIC, NOS 02/21/2007  . RHINITIS, ALLERGIC 02/21/2007  . DIVERTICULOSIS OF COLON 02/21/2007  . HEARTBURN 02/21/2007  . INCONTINENCE, URGE 02/21/2007    Otelia Limes, PTA 02/15/2017, 11:07 AM  [place], [place], [address] Phone: [phone]   Fax:  [phone]  Name: Kimberly Waters MRN: [id number] Date of Birth: [birth year]  PHYSICAL THERAPY DISCHARGE SUMMARY  Visits from Start of Care: 4  Current functional level related to goals / functional outcomes: See above   Remaining deficits: Unknown. Patient did not return after last visit, likely because she had surgery.   Education / Equipment: HEP  Plan: Patient agrees to discharge.  Patient goals were not met. Patient is being discharged due to not returning since the last visit.  ?????         Annia Friendly, [place] 01/24/18 12:08 PM

## 2017-02-16 ENCOUNTER — Telehealth: Payer: Self-pay | Admitting: *Deleted

## 2017-02-16 NOTE — Telephone Encounter (Signed)
Spoke with patient to reschedule Dr. Virgie Dad appt due to oncotype results will not be ready.  Confirmed new appointment for 03/26/17 at 1145am.

## 2017-02-27 ENCOUNTER — Encounter (HOSPITAL_COMMUNITY): Payer: Self-pay

## 2017-02-27 ENCOUNTER — Encounter (HOSPITAL_COMMUNITY)
Admission: RE | Admit: 2017-02-27 | Discharge: 2017-02-27 | Disposition: A | Discharge status: Home / Self Care | Payer: Medicare Other | Source: Ambulatory Visit | Attending: General Surgery | Admitting: General Surgery

## 2017-02-27 DIAGNOSIS — Z01812 Encounter for preprocedural laboratory examination: Secondary | ICD-10-CM | POA: Insufficient documentation

## 2017-02-27 DIAGNOSIS — C50811 Malignant neoplasm of overlapping sites of right female breast: Secondary | ICD-10-CM | POA: Diagnosis not present

## 2017-02-27 DIAGNOSIS — I493 Ventricular premature depolarization: Secondary | ICD-10-CM | POA: Insufficient documentation

## 2017-02-27 DIAGNOSIS — Z01818 Encounter for other preprocedural examination: Secondary | ICD-10-CM | POA: Insufficient documentation

## 2017-02-27 DIAGNOSIS — I10 Essential (primary) hypertension: Secondary | ICD-10-CM | POA: Diagnosis not present

## 2017-02-27 HISTORY — DX: Dyspnea, unspecified: R06.00

## 2017-02-27 HISTORY — DX: Hypothyroidism, unspecified: E03.9

## 2017-02-27 HISTORY — DX: Nausea with vomiting, unspecified: R11.2

## 2017-02-27 HISTORY — DX: Other specified postprocedural states: Z98.890

## 2017-02-27 LAB — CBC
HCT: 38 % (ref 36.0–46.0)
Hemoglobin: 12.6 g/dL (ref 12.0–15.0)
MCH: 30.7 pg (ref 26.0–34.0)
MCHC: 33.2 g/dL (ref 30.0–36.0)
MCV: 92.5 fL (ref 78.0–100.0)
PLATELETS: 259 10*3/uL (ref 150–400)
RBC: 4.11 MIL/uL (ref 3.87–5.11)
RDW: 13.2 % (ref 11.5–15.5)
WBC: 8.8 10*3/uL (ref 4.0–10.5)

## 2017-02-27 LAB — BASIC METABOLIC PANEL
Anion gap: 8 (ref 5–15)
BUN: 19 mg/dL (ref 6–20)
CO2: 27 mmol/L (ref 22–32)
Calcium: 9.1 mg/dL (ref 8.9–10.3)
Chloride: 102 mmol/L (ref 101–111)
Creatinine, Ser: 1.12 mg/dL — ABNORMAL HIGH (ref 0.44–1.00)
GFR calc Af Amer: 57 mL/min — ABNORMAL LOW (ref >60)
GFR calc non Af Amer: 49 mL/min — ABNORMAL LOW (ref >60)
GLUCOSE: 94 mg/dL (ref 65–99)
POTASSIUM: 4 mmol/L (ref 3.5–5.1)
Sodium: 137 mmol/L (ref 135–145)

## 2017-02-27 NOTE — Progress Notes (Signed)
   02/27/17 1303  OBSTRUCTIVE SLEEP APNEA  Have you ever been diagnosed with sleep apnea through a sleep study? No  Do you snore loudly (loud enough to be heard through closed doors)?  1  Do you often feel tired, fatigued, or sleepy during the daytime (such as falling asleep during driving or talking to someone)? 0  Has anyone observed you stop breathing during your sleep? 0  Do you have, or are you being treated for high blood pressure? 1  BMI more than 35 kg/m2? 1  Age > 50 (1-yes) 1  Neck circumference greater than:Female 16 inches or larger, Female 17inches or larger? 1  Female Gender (Yes=1) 0  Obstructive Sleep Apnea Score 5  Score 5 or greater  Results sent to PCP

## 2017-02-27 NOTE — Pre-Procedure Instructions (Signed)
Kimberly Waters  02/27/2017      [facility] [place] [id number] [place] [facility] [place] [address] Phone: [phone] Fax: [phone]  [place], [place] [address] Phone: [phone] Fax: [phone]  [id number] [place], [place] [address] [place] [address] Phone: [phone] Fax: [phone]    Your procedure is scheduled on  Monday  03/05/17  Report to [place] at 830 A.M.  Call this number if you have problems the morning of surgery:  [phone]   Remember:  Do not eat food or drink liquids after midnight.  Take these medicines the morning of surgery with A SIP OF WATER   ALBUTEROL IF NEEDED, IMIPRAMINE (TOFRANIL)LEVOTHYROXINE, METOPROLOL(TOPROLXL), OMEPRAZOLE       (STOP 7 DAYS PRIOR TO SURGERY- ASPIRIN OR ASPIRIN PRODUCTS, IBUPROFEN/ ADVIL/ MOTRIN, GOODY POWDERS, BC'S, HERBAL MEDICINES)   Do not wear jewelry, make-up or nail polish.  Do not wear lotions, powders, or perfumes, or deoderant.  Do not shave 48 hours prior to surgery.  Men may shave face and neck.  Do not bring valuables to the hospital.  [facility] is not responsible for any belongings or valuables.  Contacts, dentures or bridgework may not be worn into surgery.  Leave your suitcase in the car.  After surgery it may be brought to your room.  For patients admitted to the hospital, discharge time will be determined by your treatment team.  Patients discharged the day of surgery will not be allowed to drive home.   Name and phone number of your driver:    Special instructions:  [facility] - Preparing for Surgery  Before surgery, you can play an important role.  Because skin is not sterile, your skin needs to be as free of germs as possible.  You can reduce the number of germs on you skin by washing with CHG  (chlorahexidine gluconate) soap before surgery.  CHG is an antiseptic cleaner which kills germs and bonds with the skin to continue killing germs even after washing.  Please DO NOT use if you have an allergy to CHG or antibacterial soaps.  If your skin becomes reddened/irritated stop using the CHG and inform your nurse when you arrive at Short Stay.  Do not shave (including legs and underarms) for at least 48 hours prior to the first CHG shower.  You may shave your face.  Please follow these instructions carefully:   1.  Shower with CHG Soap the night before surgery and the                                morning of Surgery.  2.  If you choose to wash your hair, wash your hair first as usual with your       normal shampoo.  3.  After you shampoo, rinse your hair and body thoroughly to remove the                      Shampoo.  4.  Use CHG as you would any other liquid soap.  You can apply chg directly       to the skin and wash gently with scrungie or a clean washcloth.  5.  Apply the CHG Soap  to your body ONLY FROM THE NECK DOWN.        Do not use on open wounds or open sores.  Avoid contact with your eyes,       ears, mouth and genitals (private parts).  Wash genitals (private parts)       with your normal soap.  6.  Wash thoroughly, paying special attention to the area where your surgery        will be performed.  7.  Thoroughly rinse your body with warm water from the neck down.  8.  DO NOT shower/wash with your normal soap after using and rinsing off       the CHG Soap.  9.  Pat yourself dry with a clean towel.            10.  Wear clean pajamas.            11.  Place clean sheets on your bed the night of your first shower and do not        sleep with pets.  Day of Surgery  Do not apply any lotions/deoderants the morning of surgery.  Please wear clean clothes to the hospital/surgery center.    Please read over the following fact sheets that you were given. Pain Booklet

## 2017-03-05 ENCOUNTER — Ambulatory Visit (HOSPITAL_COMMUNITY): Payer: Medicare Other | Admitting: Emergency Medicine

## 2017-03-05 ENCOUNTER — Ambulatory Visit (HOSPITAL_COMMUNITY): Payer: Medicare Other | Admitting: Critical Care Medicine

## 2017-03-05 ENCOUNTER — Encounter (HOSPITAL_COMMUNITY): Payer: Self-pay | Admitting: Critical Care Medicine

## 2017-03-05 ENCOUNTER — Encounter (HOSPITAL_COMMUNITY)
Admission: RE | Disposition: A | Discharge status: Home / Self Care | Payer: Self-pay | Source: Ambulatory Visit | Attending: General Surgery

## 2017-03-05 ENCOUNTER — Encounter (HOSPITAL_COMMUNITY)
Admission: RE | Admit: 2017-03-05 | Discharge: 2017-03-05 | Disposition: A | Discharge status: Home / Self Care | Payer: Medicare Other | Source: Ambulatory Visit | Attending: General Surgery | Admitting: General Surgery

## 2017-03-05 ENCOUNTER — Observation Stay (HOSPITAL_COMMUNITY)
Admission: RE | Admit: 2017-03-05 | Discharge: 2017-03-06 | Disposition: A | Discharge status: Home / Self Care | Payer: Medicare Other | Source: Ambulatory Visit | Attending: General Surgery | Admitting: General Surgery

## 2017-03-05 DIAGNOSIS — F419 Anxiety disorder, unspecified: Secondary | ICD-10-CM | POA: Insufficient documentation

## 2017-03-05 DIAGNOSIS — C50911 Malignant neoplasm of unspecified site of right female breast: Secondary | ICD-10-CM

## 2017-03-05 DIAGNOSIS — E039 Hypothyroidism, unspecified: Secondary | ICD-10-CM | POA: Diagnosis not present

## 2017-03-05 DIAGNOSIS — Z170 Estrogen receptor positive status [ER+]: Secondary | ICD-10-CM | POA: Insufficient documentation

## 2017-03-05 DIAGNOSIS — F17200 Nicotine dependence, unspecified, uncomplicated: Secondary | ICD-10-CM | POA: Diagnosis not present

## 2017-03-05 DIAGNOSIS — C50111 Malignant neoplasm of central portion of right female breast: Secondary | ICD-10-CM | POA: Diagnosis not present

## 2017-03-05 DIAGNOSIS — K219 Gastro-esophageal reflux disease without esophagitis: Secondary | ICD-10-CM | POA: Insufficient documentation

## 2017-03-05 DIAGNOSIS — E7800 Pure hypercholesterolemia, unspecified: Secondary | ICD-10-CM | POA: Insufficient documentation

## 2017-03-05 DIAGNOSIS — F329 Major depressive disorder, single episode, unspecified: Secondary | ICD-10-CM | POA: Insufficient documentation

## 2017-03-05 DIAGNOSIS — Z79899 Other long term (current) drug therapy: Secondary | ICD-10-CM | POA: Insufficient documentation

## 2017-03-05 DIAGNOSIS — N6012 Diffuse cystic mastopathy of left breast: Secondary | ICD-10-CM | POA: Diagnosis not present

## 2017-03-05 DIAGNOSIS — Z803 Family history of malignant neoplasm of breast: Secondary | ICD-10-CM | POA: Insufficient documentation

## 2017-03-05 DIAGNOSIS — I10 Essential (primary) hypertension: Secondary | ICD-10-CM | POA: Insufficient documentation

## 2017-03-05 DIAGNOSIS — E785 Hyperlipidemia, unspecified: Secondary | ICD-10-CM | POA: Diagnosis not present

## 2017-03-05 DIAGNOSIS — G8918 Other acute postprocedural pain: Secondary | ICD-10-CM | POA: Diagnosis not present

## 2017-03-05 DIAGNOSIS — R921 Mammographic calcification found on diagnostic imaging of breast: Secondary | ICD-10-CM | POA: Diagnosis not present

## 2017-03-05 HISTORY — PX: MASTECTOMY W/ SENTINEL NODE BIOPSY: SHX2001

## 2017-03-05 SURGERY — MASTECTOMY WITH SENTINEL LYMPH NODE BIOPSY
Anesthesia: General | Site: Breast | Laterality: Right

## 2017-03-05 MED ORDER — HYDROCHLOROTHIAZIDE 12.5 MG PO CAPS
12.5000 mg | ORAL_CAPSULE | Freq: Every day | ORAL | Status: DC
  Administered 2017-03-06: 12.5 mg via ORAL
  Filled 2017-03-05: qty 1

## 2017-03-05 MED ORDER — CEFAZOLIN SODIUM-DEXTROSE 2-4 GM/100ML-% IV SOLN
2.0000 g | INTRAVENOUS | Status: AC
  Administered 2017-03-05: 2 g via INTRAVENOUS

## 2017-03-05 MED ORDER — KETOROLAC TROMETHAMINE 30 MG/ML IJ SOLN
30.0000 mg | Freq: Once | INTRAMUSCULAR | Status: AC
  Administered 2017-03-05: 30 mg via INTRAVENOUS

## 2017-03-05 MED ORDER — CHLORHEXIDINE GLUCONATE CLOTH 2 % EX PADS: 6.0000 | MEDICATED_PAD | Freq: Once | CUTANEOUS | Status: DC

## 2017-03-05 MED ORDER — FENTANYL CITRATE (PF) 100 MCG/2ML IJ SOLN
INTRAMUSCULAR | Status: AC
Start: 2017-03-05 — End: 2017-03-05
  Administered 2017-03-05: 100 ug via INTRAVENOUS
  Filled 2017-03-05: qty 2

## 2017-03-05 MED ORDER — FENTANYL CITRATE (PF) 100 MCG/2ML IJ SOLN
100.0000 ug | Freq: Once | INTRAMUSCULAR | Status: AC
  Administered 2017-03-05: 100 ug via INTRAVENOUS

## 2017-03-05 MED ORDER — PHENYLEPHRINE 40 MCG/ML (10ML) SYRINGE FOR IV PUSH (FOR BLOOD PRESSURE SUPPORT)
PREFILLED_SYRINGE | INTRAVENOUS | Status: DC | PRN
  Administered 2017-03-05 (×4): 40 ug via INTRAVENOUS
  Administered 2017-03-05: 80 ug via INTRAVENOUS

## 2017-03-05 MED ORDER — SODIUM CHLORIDE 0.9 % IJ SOLN
INTRAMUSCULAR | Status: AC
  Filled 2017-03-05: qty 10

## 2017-03-05 MED ORDER — FENTANYL CITRATE (PF) 100 MCG/2ML IJ SOLN
INTRAMUSCULAR | Status: AC
  Filled 2017-03-05: qty 2

## 2017-03-05 MED ORDER — LIDOCAINE 2% (20 MG/ML) 5 ML SYRINGE
INTRAMUSCULAR | Status: DC | PRN
  Administered 2017-03-05: 100 mg via INTRAVENOUS

## 2017-03-05 MED ORDER — PHENYLEPHRINE HCL 10 MG/ML IJ SOLN
INTRAVENOUS | Status: DC | PRN
  Administered 2017-03-05: 10 ug/min via INTRAVENOUS

## 2017-03-05 MED ORDER — ALBUTEROL SULFATE (2.5 MG/3ML) 0.083% IN NEBU
INHALATION_SOLUTION | RESPIRATORY_TRACT | Status: AC
  Filled 2017-03-05: qty 3

## 2017-03-05 MED ORDER — ONDANSETRON HCL 4 MG/2ML IJ SOLN
INTRAMUSCULAR | Status: DC | PRN
  Administered 2017-03-05: 4 mg via INTRAVENOUS

## 2017-03-05 MED ORDER — SIMVASTATIN 20 MG PO TABS
20.0000 mg | ORAL_TABLET | Freq: Every day | ORAL | Status: DC
  Administered 2017-03-05: 20 mg via ORAL
  Filled 2017-03-05: qty 1

## 2017-03-05 MED ORDER — CEFAZOLIN SODIUM-DEXTROSE 2-4 GM/100ML-% IV SOLN
INTRAVENOUS | Status: AC
  Filled 2017-03-05: qty 100

## 2017-03-05 MED ORDER — GABAPENTIN 300 MG PO CAPS
ORAL_CAPSULE | ORAL | Status: AC
  Administered 2017-03-05: 300 mg via ORAL
  Filled 2017-03-05: qty 1

## 2017-03-05 MED ORDER — METHYLENE BLUE 0.5 % INJ SOLN
INTRAVENOUS | Status: AC
  Filled 2017-03-05: qty 10

## 2017-03-05 MED ORDER — OXYCODONE-ACETAMINOPHEN 5-325 MG PO TABS
1.0000 | ORAL_TABLET | ORAL | Status: DC | PRN
  Administered 2017-03-05 – 2017-03-06 (×2): 2 via ORAL
  Filled 2017-03-05 (×2): qty 2

## 2017-03-05 MED ORDER — MORPHINE SULFATE (PF) 2 MG/ML IV SOLN
2.0000 mg | INTRAVENOUS | Status: DC | PRN
  Administered 2017-03-05 – 2017-03-06 (×2): 2 mg via INTRAVENOUS
  Filled 2017-03-05 (×2): qty 1

## 2017-03-05 MED ORDER — MEPERIDINE HCL 25 MG/ML IJ SOLN: 6.2500 mg | INTRAMUSCULAR | Status: DC | PRN

## 2017-03-05 MED ORDER — CELECOXIB 200 MG PO CAPS
400.0000 mg | ORAL_CAPSULE | ORAL | Status: AC
  Administered 2017-03-05: 400 mg via ORAL

## 2017-03-05 MED ORDER — HYDROMORPHONE HCL 1 MG/ML IJ SOLN
INTRAMUSCULAR | Status: AC
  Filled 2017-03-05: qty 1

## 2017-03-05 MED ORDER — LISINOPRIL-HYDROCHLOROTHIAZIDE 20-25 MG PO TABS: 0.5000 | ORAL_TABLET | Freq: Every day | ORAL | Status: DC

## 2017-03-05 MED ORDER — PROPOFOL 10 MG/ML IV BOLUS
INTRAVENOUS | Status: AC
  Filled 2017-03-05: qty 20

## 2017-03-05 MED ORDER — ACETAMINOPHEN 500 MG PO TABS
1000.0000 mg | ORAL_TABLET | ORAL | Status: AC
  Administered 2017-03-05: 1000 mg via ORAL

## 2017-03-05 MED ORDER — DEXAMETHASONE SODIUM PHOSPHATE 4 MG/ML IJ SOLN
4.0000 mg | INTRAMUSCULAR | Status: AC
  Administered 2017-03-05: 4 mg via INTRAVENOUS
  Filled 2017-03-05: qty 1

## 2017-03-05 MED ORDER — SUGAMMADEX SODIUM 200 MG/2ML IV SOLN
INTRAVENOUS | Status: DC | PRN
  Administered 2017-03-05: 180 mg via INTRAVENOUS

## 2017-03-05 MED ORDER — ALBUTEROL 90 MCG/ACT IN AERS: 2.0000 | INHALATION_SPRAY | RESPIRATORY_TRACT | Status: DC | PRN

## 2017-03-05 MED ORDER — ALBUTEROL SULFATE (2.5 MG/3ML) 0.083% IN NEBU: 2.5000 mg | INHALATION_SOLUTION | RESPIRATORY_TRACT | Status: DC | PRN

## 2017-03-05 MED ORDER — IMIPRAMINE HCL 50 MG PO TABS
200.0000 mg | ORAL_TABLET | Freq: Every day | ORAL | Status: DC
  Administered 2017-03-05: 200 mg via ORAL
  Filled 2017-03-05: qty 4

## 2017-03-05 MED ORDER — ACETAMINOPHEN 500 MG PO TABS
ORAL_TABLET | ORAL | Status: AC
  Administered 2017-03-05: 1000 mg via ORAL
  Filled 2017-03-05: qty 2

## 2017-03-05 MED ORDER — PANTOPRAZOLE SODIUM 40 MG PO TBEC
40.0000 mg | DELAYED_RELEASE_TABLET | Freq: Every day | ORAL | Status: DC
  Administered 2017-03-06: 40 mg via ORAL
  Filled 2017-03-05: qty 1

## 2017-03-05 MED ORDER — METOPROLOL SUCCINATE ER 25 MG PO TB24
25.0000 mg | ORAL_TABLET | Freq: Every day | ORAL | Status: DC
  Filled 2017-03-05: qty 1

## 2017-03-05 MED ORDER — GABAPENTIN 300 MG PO CAPS
300.0000 mg | ORAL_CAPSULE | ORAL | Status: AC
  Administered 2017-03-05: 300 mg via ORAL

## 2017-03-05 MED ORDER — ONDANSETRON HCL 4 MG/2ML IJ SOLN: 4.0000 mg | Freq: Four times a day (QID) | INTRAMUSCULAR | Status: DC | PRN

## 2017-03-05 MED ORDER — FENTANYL CITRATE (PF) 100 MCG/2ML IJ SOLN
INTRAMUSCULAR | Status: AC
  Filled 2017-03-05: qty 4

## 2017-03-05 MED ORDER — LACTATED RINGERS IV SOLN
INTRAVENOUS | Status: DC
  Administered 2017-03-05: 09:00:00 via INTRAVENOUS

## 2017-03-05 MED ORDER — TECHNETIUM TC 99M SULFUR COLLOID FILTERED: 1.0000 | Freq: Once | INTRAVENOUS | Status: DC | PRN

## 2017-03-05 MED ORDER — POTASSIUM CHLORIDE IN NACL 20-0.9 MEQ/L-% IV SOLN
INTRAVENOUS | Status: DC
  Administered 2017-03-05 – 2017-03-06 (×2): via INTRAVENOUS
  Filled 2017-03-05 (×3): qty 1000

## 2017-03-05 MED ORDER — LISINOPRIL 10 MG PO TABS
10.0000 mg | ORAL_TABLET | Freq: Every day | ORAL | Status: DC
  Administered 2017-03-06: 10 mg via ORAL
  Filled 2017-03-05: qty 1

## 2017-03-05 MED ORDER — ENOXAPARIN SODIUM 40 MG/0.4ML ~~LOC~~ SOLN
40.0000 mg | SUBCUTANEOUS | Status: DC
  Administered 2017-03-06: 40 mg via SUBCUTANEOUS
  Filled 2017-03-05: qty 0.4

## 2017-03-05 MED ORDER — MIDAZOLAM HCL 5 MG/5ML IJ SOLN
INTRAMUSCULAR | Status: DC | PRN
  Administered 2017-03-05: 1 mg via INTRAVENOUS

## 2017-03-05 MED ORDER — LEVOTHYROXINE SODIUM 112 MCG PO TABS
112.0000 ug | ORAL_TABLET | Freq: Every day | ORAL | Status: DC
  Administered 2017-03-06: 112 ug via ORAL
  Filled 2017-03-05: qty 1

## 2017-03-05 MED ORDER — ONDANSETRON HCL 4 MG/2ML IJ SOLN
4.0000 mg | Freq: Once | INTRAMUSCULAR | Status: AC
  Administered 2017-03-05: 4 mg via INTRAVENOUS
  Filled 2017-03-05: qty 2

## 2017-03-05 MED ORDER — ALBUTEROL SULFATE (2.5 MG/3ML) 0.083% IN NEBU
2.5000 mg | INHALATION_SOLUTION | Freq: Once | RESPIRATORY_TRACT | Status: AC
  Administered 2017-03-05: 2.5 mg via RESPIRATORY_TRACT

## 2017-03-05 MED ORDER — PROMETHAZINE HCL 25 MG/ML IJ SOLN: 6.2500 mg | INTRAMUSCULAR | Status: DC | PRN

## 2017-03-05 MED ORDER — KETOROLAC TROMETHAMINE 30 MG/ML IJ SOLN
INTRAMUSCULAR | Status: AC
  Filled 2017-03-05: qty 1

## 2017-03-05 MED ORDER — BUPIVACAINE-EPINEPHRINE (PF) 0.5% -1:200000 IJ SOLN
INTRAMUSCULAR | Status: DC | PRN
  Administered 2017-03-05 (×2): 20 mL

## 2017-03-05 MED ORDER — CELECOXIB 200 MG PO CAPS
ORAL_CAPSULE | ORAL | Status: AC
  Administered 2017-03-05: 400 mg via ORAL
  Filled 2017-03-05: qty 2

## 2017-03-05 MED ORDER — MIDAZOLAM HCL 2 MG/2ML IJ SOLN
INTRAMUSCULAR | Status: AC
  Filled 2017-03-05: qty 2

## 2017-03-05 MED ORDER — 0.9 % SODIUM CHLORIDE (POUR BTL) OPTIME
TOPICAL | Status: DC | PRN
  Administered 2017-03-05 (×2): 1000 mL

## 2017-03-05 MED ORDER — ONDANSETRON 4 MG PO TBDP: 4.0000 mg | ORAL_TABLET | Freq: Four times a day (QID) | ORAL | Status: DC | PRN

## 2017-03-05 MED ORDER — PROPOFOL 10 MG/ML IV BOLUS
INTRAVENOUS | Status: DC | PRN
  Administered 2017-03-05: 150 mg via INTRAVENOUS

## 2017-03-05 MED ORDER — ROCURONIUM BROMIDE 50 MG/5ML IV SOSY
PREFILLED_SYRINGE | INTRAVENOUS | Status: DC | PRN
  Administered 2017-03-05: 10 mg via INTRAVENOUS
  Administered 2017-03-05: 40 mg via INTRAVENOUS

## 2017-03-05 MED ORDER — FENTANYL CITRATE (PF) 100 MCG/2ML IJ SOLN
INTRAMUSCULAR | Status: DC | PRN
  Administered 2017-03-05: 50 ug via INTRAVENOUS
  Administered 2017-03-05: 100 ug via INTRAVENOUS
  Administered 2017-03-05 (×2): 50 ug via INTRAVENOUS

## 2017-03-05 MED ORDER — MIDAZOLAM HCL 2 MG/2ML IJ SOLN
INTRAMUSCULAR | Status: AC
  Administered 2017-03-05: 2 mg via INTRAVENOUS
  Filled 2017-03-05: qty 2

## 2017-03-05 MED ORDER — NICOTINE 21 MG/24HR TD PT24
21.0000 mg | MEDICATED_PATCH | Freq: Every day | TRANSDERMAL | Status: DC
  Filled 2017-03-05 (×2): qty 1

## 2017-03-05 MED ORDER — MIDAZOLAM HCL 2 MG/2ML IJ SOLN
2.0000 mg | Freq: Once | INTRAMUSCULAR | Status: AC
  Administered 2017-03-05: 2 mg via INTRAVENOUS

## 2017-03-05 MED ORDER — HYDROMORPHONE HCL 1 MG/ML IJ SOLN
0.2500 mg | INTRAMUSCULAR | Status: DC | PRN
  Administered 2017-03-05 (×2): 0.5 mg via INTRAVENOUS

## 2017-03-05 SURGICAL SUPPLY — 62 items
ADH SKN CLS APL DERMABOND .7 (GAUZE/BANDAGES/DRESSINGS) ×4
BINDER BREAST LRG (GAUZE/BANDAGES/DRESSINGS) IMPLANT
BINDER BREAST XLRG (GAUZE/BANDAGES/DRESSINGS) ×1 IMPLANT
BIOPATCH RED 1 DISK 7.0 (GAUZE/BANDAGES/DRESSINGS) ×2 IMPLANT
CANISTER SUCT 3000ML PPV (MISCELLANEOUS) ×4 IMPLANT
CHLORAPREP W/TINT 26ML (MISCELLANEOUS) ×2 IMPLANT
CLIP TI MEDIUM 6 (CLIP) ×2 IMPLANT
CONT SPEC 4OZ CLIKSEAL STRL BL (MISCELLANEOUS) ×1 IMPLANT
COVER PROBE W GEL 5X96 (DRAPES) ×2 IMPLANT
COVER SURGICAL LIGHT HANDLE (MISCELLANEOUS) ×2 IMPLANT
DERMABOND ADVANCED (GAUZE/BANDAGES/DRESSINGS) ×4
DERMABOND ADVANCED .7 DNX12 (GAUZE/BANDAGES/DRESSINGS) ×1 IMPLANT
DEVICE DISSECT PLASMABLAD 3.0S (MISCELLANEOUS) ×1 IMPLANT
DRAIN CHANNEL 19F RND (DRAIN) ×3 IMPLANT
DRAPE CHEST BREAST 15X10 FENES (DRAPES) ×2 IMPLANT
DRAPE SURG 17X23 STRL (DRAPES) ×8 IMPLANT
DRSG PAD ABDOMINAL 8X10 ST (GAUZE/BANDAGES/DRESSINGS) ×2 IMPLANT
DRSG TEGADERM 2-3/8X2-3/4 SM (GAUZE/BANDAGES/DRESSINGS) ×2 IMPLANT
ELECT CAUTERY BLADE 6.4 (BLADE) ×2 IMPLANT
ELECT COATED BLADE 2.86 ST (ELECTRODE) ×1 IMPLANT
ELECT REM PT RETURN 9FT ADLT (ELECTROSURGICAL) ×4
ELECTRODE REM PT RTRN 9FT ADLT (ELECTROSURGICAL) ×2 IMPLANT
EVACUATOR SILICONE 100CC (DRAIN) ×3 IMPLANT
GLOVE BIO SURGEON STRL SZ7 (GLOVE) ×3 IMPLANT
GLOVE BIOGEL PI IND STRL 6.5 (GLOVE) IMPLANT
GLOVE BIOGEL PI IND STRL 7.0 (GLOVE) IMPLANT
GLOVE BIOGEL PI IND STRL 8 (GLOVE) ×1 IMPLANT
GLOVE BIOGEL PI INDICATOR 6.5 (GLOVE) ×1
GLOVE BIOGEL PI INDICATOR 7.0 (GLOVE) ×2
GLOVE BIOGEL PI INDICATOR 8 (GLOVE) ×1
GLOVE ECLIPSE 7.5 STRL STRAW (GLOVE) ×2 IMPLANT
GLOVE EUDERMIC 7 POWDERFREE (GLOVE) ×1 IMPLANT
GOWN STRL REUS W/ TWL LRG LVL3 (GOWN DISPOSABLE) ×1 IMPLANT
GOWN STRL REUS W/ TWL XL LVL3 (GOWN DISPOSABLE) ×1 IMPLANT
GOWN STRL REUS W/TWL LRG LVL3 (GOWN DISPOSABLE) ×2
GOWN STRL REUS W/TWL XL LVL3 (GOWN DISPOSABLE) ×4
KIT BASIN OR (CUSTOM PROCEDURE TRAY) ×2 IMPLANT
KIT ROOM TURNOVER OR (KITS) ×2 IMPLANT
NDL 18GX1X1/2 (RX/OR ONLY) (NEEDLE) ×1 IMPLANT
NDL BLUNT 16X1.5 OR ONLY (NEEDLE) IMPLANT
NDL HYPO 25GX1X1/2 BEV (NEEDLE) ×1 IMPLANT
NEEDLE 18GX1X1/2 (RX/OR ONLY) (NEEDLE) IMPLANT
NEEDLE BLUNT 16X1.5 OR ONLY (NEEDLE) IMPLANT
NEEDLE HYPO 25GX1X1/2 BEV (NEEDLE) IMPLANT
NS IRRIG 1000ML POUR BTL (IV SOLUTION) ×2 IMPLANT
PACK GENERAL/GYN (CUSTOM PROCEDURE TRAY) ×2 IMPLANT
PAD ARMBOARD 7.5X6 YLW CONV (MISCELLANEOUS) ×2 IMPLANT
PLASMABLADE 3.0S (MISCELLANEOUS) ×2
SPECIMEN JAR X LARGE (MISCELLANEOUS) ×2 IMPLANT
SPONGE LAP 18X18 X RAY DECT (DISPOSABLE) ×1 IMPLANT
STAPLER VISISTAT 35W (STAPLE) ×1 IMPLANT
SUT ETHILON 2 0 FS 18 (SUTURE) ×3 IMPLANT
SUT MNCRL AB 4-0 PS2 18 (SUTURE) ×3 IMPLANT
SUT VIC AB 3-0 54X BRD REEL (SUTURE) ×1 IMPLANT
SUT VIC AB 3-0 BRD 54 (SUTURE) ×4
SUT VIC AB 3-0 SH 18 (SUTURE) ×4 IMPLANT
SUT VIC AB 3-0 SH 27 (SUTURE) ×2
SUT VIC AB 3-0 SH 27XBRD (SUTURE) IMPLANT
SYR CONTROL 10ML LL (SYRINGE) ×1 IMPLANT
TOWEL OR 17X24 6PK STRL BLUE (TOWEL DISPOSABLE) ×1 IMPLANT
TOWEL OR 17X26 10 PK STRL BLUE (TOWEL DISPOSABLE) ×2 IMPLANT
TUBE CONNECTING 12X1/4 (SUCTIONS) ×2 IMPLANT

## 2017-03-05 NOTE — Transfer of Care (Signed)
Immediate Anesthesia Transfer of Care Note  Patient: Kimberly Waters  Procedure(s) Performed: Procedure(s): BILATERAL TOTAL MASTECTOMIES WITH RIGHT SENTINEL LYMPH NODE BIOPSIES (Right)  Patient Location: PACU  Anesthesia Type:General and Regional  Level of Consciousness: awake, alert  and oriented  Airway & Oxygen Therapy: Patient Spontanous Breathing and Patient connected to nasal cannula oxygen  Post-op Assessment: Report given to RN, Post -op Vital signs reviewed and stable and Patient moving all extremities X 4  Post vital signs: Reviewed and stable  Last Vitals:  Vitals:   03/05/17 0838 03/05/17 1302  BP: 137/72   Pulse: 74 66  Resp: 18 16  Temp: 36.8 C 36.7 C   HR 68, RR 15, sats 93%, BP 112/64 Last Pain:  Vitals:   03/05/17 0838  TempSrc: Oral      Patients Stated Pain Goal: 3 (55/73/22 0254)  Complications: No apparent anesthesia complications

## 2017-03-05 NOTE — Anesthesia Preprocedure Evaluation (Addendum)
Anesthesia Evaluation  Patient identified by MRN, date of birth, ID band  Reviewed: NPO status , Patient's Chart, lab work & pertinent test results, reviewed documented beta blocker date and time   History of Anesthesia Complications (+) PONV and history of anesthetic complications  Airway Mallampati: I       Dental no notable dental hx.    Pulmonary Current Smoker,  Will use inhaler prior to OR.   Pulmonary exam normal        Cardiovascular hypertension, Pt. on medications and Pt. on home beta blockers Normal cardiovascular exam     Neuro/Psych PSYCHIATRIC DISORDERS Depression negative neurological ROS     GI/Hepatic GERD  Medicated and Controlled,  Endo/Other  Hypothyroidism   Renal/GU negative Renal ROS  negative genitourinary   Musculoskeletal negative musculoskeletal ROS (+)   Abdominal Normal abdominal exam  (+)   Peds  Hematology   Anesthesia Other Findings   Reproductive/Obstetrics negative OB ROS                            Anesthesia Physical Anesthesia Plan  ASA: II  Anesthesia Plan: General   Post-op Pain Management:  Regional for Post-op pain   Induction: Intravenous  Airway Management Planned: Oral ETT  Additional Equipment:   Intra-op Plan:   Post-operative Plan: Extubation in OR  Informed Consent: I have reviewed the patients History and Physical, chart, labs and discussed the procedure including the risks, benefits and alternatives for the proposed anesthesia with the patient or authorized representative who has indicated his/her understanding and acceptance.     Plan Discussed with: CRNA and Surgeon  Anesthesia Plan Comments:         Anesthesia Quick Evaluation

## 2017-03-05 NOTE — Interval H&P Note (Signed)
History and Physical Interval Note:  03/05/2017 10:26 AM  Kimberly Waters  has presented today for surgery, with the diagnosis of RIGHT BREAST CANCER MULTICENTRIC  The various methods of treatment have been discussed with the patient and family. After consideration of risks, benefits and other options for treatment, the patient has consented to  Procedure(s): BILATERAL TOTAL MASTECTOMIES WITH RIGHT SENTINEL LYMPH NODE BIOPSY (Bilateral) as a surgical intervention .  The patient's history has been reviewed, patient examined, no change in status, stable for surgery.  I have reviewed the patient's chart and labs.  Questions were answered to the patient's satisfaction.     Olman Yono T

## 2017-03-05 NOTE — H&P (Signed)
History of Present Illness Marland Kitchen T. Verne Lanuza MD; 02/07/2017 11:31 AM) The patient is a 71 year old female who presents with breast cancer. She is a postmenopausal female referred by Dr. Emmit Pomfret for evaluation of recently diagnosed carcinoma of the right breast. the patient has noted an inverted right nipple for approximately 3 months. This has not changed and she has not been able to feel any breast mass. she presented to her primary physician and was referred for workup. Subsequent imaging included diagnostic mamogram showing dense breast tissue with new architectural distortion just behind the nipple causing nipple retraction and ultrasound showing a 2.2 cm irregular mass central to the nipple anterior depth but also a 2.5 cm irregular mass at the 12:00 position middle depth. An ultrasound guided breast biopsy was performed on 01/29/2017 with pathology revealing invasive and in situ carcinoma of the breast from both masses. She is seen now in breast multidisciplinary clinic for initial treatment planning. She has experienced nipple retraction as noted but no pain or palpable mass or skin changes. She does have a personal history of 2 previous lumpectomies on the right and 3 previous lumpectomies on the left in years past for benign disease. she is a retired Environmental consultant and lives with her husband and enjoys camping and traveling.  Findings at that time were the following: Tumor size: 2.2 and 2.5 cm Tumor grade: 1-2 Estrogen Receptor: positive Progesterone Receptor: positive Her-2 neu: negative Lymph node status: negative    Past Surgical History Tawni Pummel, RN; 02/07/2017 7:33 AM) Breast Biopsy  Bilateral. multiple Hysterectomy (not due to cancer) - Complete  Knee Surgery  Right. Shoulder Surgery  Bilateral.  Diagnostic Studies History Tawni Pummel, RN; 02/07/2017 7:33 AM) Colonoscopy  1-5 years ago Mammogram  within last year Pap Smear  >5 years  ago  Medication History Tawni Pummel, RN; 02/07/2017 7:33 AM) Medications Reconciled  Social History Tawni Pummel, RN; 02/07/2017 7:33 AM) Alcohol use  Occasional alcohol use. Caffeine use  Carbonated beverages, Coffee, Tea. No drug use  Tobacco use  Current every day smoker.  Family History Tawni Pummel, RN; 02/07/2017 7:33 AM) Arthritis  Mother. Breast Cancer  Family Members In General. Colon Cancer  Family Members In General. Diabetes Mellitus  Family Members In General. Heart Disease  Mother. Heart disease in female family member before age 34  Hypertension  Mother. Thyroid problems  Mother.  Pregnancy / Birth History Tawni Pummel, RN; 02/07/2017 7:33 AM) Age at menarche  44 years. Age of menopause  <47 Contraceptive History  Oral contraceptives. Gravida  2 Maternal age  30-25 Para  2  Other Problems Tawni Pummel, RN; 02/07/2017 7:33 AM) Anxiety Disorder  Arthritis  Asthma  Gastroesophageal Reflux Disease  High blood pressure  Hypercholesterolemia  Lump In Breast  Thyroid Disease     Review of Systems Sunday Spillers Ledford RN; 02/07/2017 7:33 AM) General Present- Fatigue. Not Present- Appetite Loss, Chills, Fever, Night Sweats, Weight Gain and Weight Loss. Skin Not Present- Change in Wart/Mole, Dryness, Hives, Jaundice, New Lesions, Non-Healing Wounds, Rash and Ulcer. HEENT Present- Seasonal Allergies and Wears glasses/contact lenses. Not Present- Earache, Hearing Loss, Hoarseness, Nose Bleed, Oral Ulcers, Ringing in the Ears, Sinus Pain, Sore Throat, Visual Disturbances and Yellow Eyes. Respiratory Present- Snoring. Not Present- Bloody sputum, Chronic Cough, Difficulty Breathing and Wheezing. Breast Present- Breast Mass. Not Present- Breast Pain, Nipple Discharge and Skin Changes. Cardiovascular Not Present- Chest Pain, Difficulty Breathing Lying Down, Leg Cramps, Palpitations, Rapid Heart Rate, Shortness of Breath and  Swelling of  Extremities. Gastrointestinal Not Present- Abdominal Pain, Bloating, Bloody Stool, Change in Bowel Habits, Chronic diarrhea, Constipation, Difficulty Swallowing, Excessive gas, Gets full quickly at meals, Hemorrhoids, Indigestion, Nausea, Rectal Pain and Vomiting. Female Genitourinary Not Present- Frequency, Nocturia, Painful Urination, Pelvic Pain and Urgency. Musculoskeletal Present- Joint Pain. Not Present- Back Pain, Joint Stiffness, Muscle Pain, Muscle Weakness and Swelling of Extremities. Neurological Not Present- Decreased Memory, Fainting, Headaches, Numbness, Seizures, Tingling, Tremor, Trouble walking and Weakness. Psychiatric Present- Anxiety. Not Present- Bipolar, Change in Sleep Pattern, Depression, Fearful and Frequent crying. Endocrine Not Present- Cold Intolerance, Excessive Hunger, Hair Changes, Heat Intolerance, Hot flashes and New Diabetes. Hematology Not Present- Blood Thinners, Easy Bruising, Excessive bleeding, Gland problems, HIV and Persistent Infections.   Physical Exam Marland Kitchen T. Lakethia Coppess MD; 02/07/2017 11:33 AM) The physical exam findings are as follows: Note:General: Alert, moderately obese Caucasian female, in no distress Skin: Warm and dry without rash or infection. HEENT: No palpable masses or thyromegaly. Sclera nonicteric. Pupils equal round and reactive. Lymph nodes: No cervical, supraclavicular, nodes palpable. Breasts: There is mild right nipple inversion. There is a palpable mass or thickening just above and lateral to the nipple, indistinct. No oeast. No palpable axillary adenopathy.n either breast. No palpable axillary adenopathy. Lungs: Breath sounds clear and equal. No wheezing or increased work of breathing. Cardiovascular: Regular rate and rhythm without murmer. No JVD or edema. Peripheral pulses intact. Abdomen: Nondistended. Soft and nontender. No masses palpable. No organomegaly. Extremities: No edema or joint swelling or deformity. No chronic  venous stasis changes. Neurologic: Alert and fully oriented. Gait normal. No focal weakness. Psychiatric: Normal mood and affect. Thought content appropriate with normal judgement and insight    Assessment & Plan Marland Kitchen T. Estha Few MD; 02/07/2017 11:39 AM) MALIGNANT NEOPLASM OF RIGHT BREAST, STAGE 1, ESTROGEN RECEPTOR POSITIVE (C50.911) Impression: 71 year old female with a new diagnosis of cancer of the right breast, centrally and upper outer quadrant. Clinical stage 2A anatomic staging and one 2B prognostic staging, ER positive, PR , HER-2 negative. I discussed with the patient and her daughter today initial surgical treatment options. We discussed options of breast conservation with lumpectomy or total mastectomy and sentinal lymph node biopsy/dissection. due to 2 separate cancers in the breast and one involving the nipple I believe she would be best serve with total mastectomy which is what the patient would prefer regardless of anatomic considerations. In fact she strongly desires bilateral mastectomy. We discussed that contralateral prophylactic mastectomy does not affect his survival from breast cancer and does increase risk of surgical complications. She understands this but feels very strongly she would not be comfortable with anything short of bilateral mastectomy. We discussed the indications and nature of the procedure, and expected recovery, in detail. Surgical risks including anesthetic complications, cardiorespiratory complications, bleeding, infection, wound healing complications, blood clots, lymphedema, local and distant recurrence and possible need for further surgery based on the final pathology was discussed and understood. Chemotherapy, hormonal therapy and radiation therapy have been discussed. They have been provided with literature regarding the treatment of breast cancer. All questions were answered. They understand and agree to proceed and we will go ahead with  scheduling. Current Plans bilateral total mastectomy with right axillary sentinel lymph node biopsy under general anesthesia with overnight hospitalization

## 2017-03-05 NOTE — Anesthesia Procedure Notes (Signed)
Anesthesia Regional Block: Pectoralis block   Pre-Anesthetic Checklist: ,, timeout performed, Correct Patient, Correct Site, Correct Laterality, Correct Procedure, Correct Position, site marked, Risks and benefits discussed, pre-op evaluation,  At surgeon's request and post-op pain management  Laterality: Left and Right  Prep: Maximum Sterile Barrier Precautions used, chloraprep       Needles:  Injection technique: Single-shot  Needle Type: Echogenic Stimulator Needle     Needle Length: 9cm  Needle Gauge: 21     Additional Needles:   Procedures: ultrasound guided,,,,,,,,  Narrative:  Start time: 03/05/2017 9:15 AM End time: 03/05/2017 9:30 AM Injection made incrementally with aspirations every 5 mL. Anesthesiologist: Roderic Palau  Additional Notes: 2% Lidocaine skin wheel. Bilateral PECS block

## 2017-03-05 NOTE — Anesthesia Postprocedure Evaluation (Addendum)
Anesthesia Post Note  Patient: Kimberly Waters  Procedure(s) Performed: Procedure(s) (LRB): BILATERAL TOTAL MASTECTOMIES WITH RIGHT SENTINEL LYMPH NODE BIOPSIES (Right)  Patient location during evaluation: PACU Anesthesia Type: General Level of consciousness: awake and sedated Pain management: pain level controlled Vital Signs Assessment: post-procedure vital signs reviewed and stable Respiratory status: spontaneous breathing and patient connected to face mask oxygen Cardiovascular status: stable Postop Assessment: no signs of nausea or vomiting Anesthetic complications: no        Last Vitals:  Vitals:   03/05/17 1335 03/05/17 1350  BP: 99/62 (!) 91/56  Pulse: 68 70  Resp: 11 19  Temp:      Last Pain:  Vitals:   03/05/17 1355  TempSrc:   PainSc: Asleep   Pain Goal: Patients Stated Pain Goal: 3 (03/05/17 0850)               Kandise Riehle JR,JOHN Mateo Flow

## 2017-03-05 NOTE — Anesthesia Procedure Notes (Signed)
Procedure Name: Intubation Date/Time: 03/05/2017 10:36 AM Performed by: Merrilyn Puma B Pre-anesthesia Checklist: Patient identified, Emergency Drugs available, Suction available, Patient being monitored and Timeout performed Patient Re-evaluated:Patient Re-evaluated prior to inductionOxygen Delivery Method: Circle system utilized Preoxygenation: Pre-oxygenation with 100% oxygen Intubation Type: IV induction Ventilation: Mask ventilation without difficulty Laryngoscope Size: Mac and 3 Grade View: Grade II Tube type: Oral Number of attempts: 1 Airway Equipment and Method: Stylet Placement Confirmation: ETT inserted through vocal cords under direct vision,  positive ETCO2,  CO2 detector and breath sounds checked- equal and bilateral Secured at: 21 cm Tube secured with: Tape Dental Injury: Teeth and Oropharynx as per pre-operative assessment  Comments: DLx1 with MAC 3, right rear molar noticeably loose when scissoring mouth open, careful to avoid teeth with blade and ETT.  Atraumatic intubation with 7.0 ETT.

## 2017-03-05 NOTE — Op Note (Signed)
Preoperative Diagnosis: RIGHT BREAST CANCER MULTICENTRIC  Postoprative Diagnosis: RIGHT BREAST CANCER MULTICENTRIC  Procedure: Procedure(s): BILATERAL TOTAL MASTECTOMIES WITH RIGHT SENTINEL LYMPH NODE BIOPSIES   Surgeon: Excell Seltzer T   Assistants: Fanny Skates  Anesthesia:  General endotracheal anesthesia  Indications: Patient is a 71 year old female with a recent diagnosis of stage I multicentric invasive ductal carcinoma of the right breast. After extensive preoperative workup and discussion detailed elsewhere we have elected to proceed with right total mastectomy with axillary sentinel lymph node biopsy and the patient has requested prophylactic left mastectomy also after extensive counseling.    Procedure Detail:  In the holding area she underwent injection of 1 mCi of technetium sulfur colloid intradermally around the right nipple. Also underwent bilateral pectoral block by anesthesia. She was taken to the operating room, placed in supine position on the operating table and general endotracheal anesthesia induced. She received preoperative IV antibiotics. PAS weren't place. The entire anterior chest, axilla and upper arms were widely sterilely prepped and draped. Patient timeout was performed and correct procedure verified. The left side was approached initially. A slightly oblique elliptical incision encompassing the nipple areola complex was used using the plasma blade. Skin and subcutaneous flaps were then raised superiorly toward the clavicle, medially to the sternum, inferiorly to the inframammary crease and laterally out to the anterior border of the latissimus. The specimen was then reflected off the chest wall working medial to lateral. It was detached from the pectoralis and its lateral border and dissected off the serratus and off the inferior portion of the latissimus working up toward the axilla. Clavipectoral fascia was incised. Axillary contents appeared normal. We then  came across the specimen in the low axilla with Kelly clamps and it was removed and oriented and sent for permanent pathology. Axillary tissue was tied with 3-0 Vicryl. The wound was thoroughly irrigated. A 19 Blake closed suction drain was left through a lateral stab wound under both flaps. The subcutaneous tissue was closed with interrupted 3-0 Vicryl and skin with running subcuticular 4-0 Monocryl and Dermabond. Attention was turned to the right side. An identical dissection was performed. As we exposed the axilla the neoprobe was used to localize several nodes with elevated counts in the axilla. A total of 4 sentinel lymph nodes were identified and removed. A fifth slightly enlarged node without elevated counts was also removed. This point there was minimal background in the axilla. No other palpable adenopathy. We again came across the low axilla in an identical fashion of the specimen was removed. The wound was closed in an identical fashion. Sponge needle and instrument counts were correct.     Finding As above  Estimated Blood Loss:  less than 50 mL         Drains: #19 round Blake one each side  Blood Given: none          Specimens:   #1 left total mastectomy   #2 right total mastectomy   #3 right axillary lymph nodes 5  Complications:  * No complications entered in OR log *         Disposition: PACU - hemodynamically stable.         Condition: stable

## 2017-03-06 ENCOUNTER — Encounter (HOSPITAL_COMMUNITY): Payer: Self-pay | Admitting: General Surgery

## 2017-03-06 DIAGNOSIS — E7800 Pure hypercholesterolemia, unspecified: Secondary | ICD-10-CM | POA: Diagnosis not present

## 2017-03-06 DIAGNOSIS — C50911 Malignant neoplasm of unspecified site of right female breast: Secondary | ICD-10-CM | POA: Diagnosis not present

## 2017-03-06 DIAGNOSIS — Z170 Estrogen receptor positive status [ER+]: Secondary | ICD-10-CM | POA: Diagnosis not present

## 2017-03-06 DIAGNOSIS — K219 Gastro-esophageal reflux disease without esophagitis: Secondary | ICD-10-CM | POA: Diagnosis not present

## 2017-03-06 DIAGNOSIS — F419 Anxiety disorder, unspecified: Secondary | ICD-10-CM | POA: Diagnosis not present

## 2017-03-06 DIAGNOSIS — F17200 Nicotine dependence, unspecified, uncomplicated: Secondary | ICD-10-CM | POA: Diagnosis not present

## 2017-03-06 LAB — CBC
HCT: 32.7 % — ABNORMAL LOW (ref 36.0–46.0)
Hemoglobin: 10.4 g/dL — ABNORMAL LOW (ref 12.0–15.0)
MCH: 30.1 pg (ref 26.0–34.0)
MCHC: 31.8 g/dL (ref 30.0–36.0)
MCV: 94.5 fL (ref 78.0–100.0)
PLATELETS: 230 10*3/uL (ref 150–400)
RBC: 3.46 MIL/uL — AB (ref 3.87–5.11)
RDW: 13.6 % (ref 11.5–15.5)
WBC: 11.2 10*3/uL — AB (ref 4.0–10.5)

## 2017-03-06 LAB — BASIC METABOLIC PANEL
ANION GAP: 5 (ref 5–15)
BUN: 23 mg/dL — AB (ref 6–20)
CALCIUM: 8.3 mg/dL — AB (ref 8.9–10.3)
CO2: 26 mmol/L (ref 22–32)
Chloride: 105 mmol/L (ref 101–111)
Creatinine, Ser: 1.39 mg/dL — ABNORMAL HIGH (ref 0.44–1.00)
GFR calc Af Amer: 44 mL/min — ABNORMAL LOW (ref >60)
GFR, EST NON AFRICAN AMERICAN: 38 mL/min — AB (ref >60)
Glucose, Bld: 89 mg/dL (ref 65–99)
POTASSIUM: 4.3 mmol/L (ref 3.5–5.1)
SODIUM: 136 mmol/L (ref 135–145)

## 2017-03-06 MED ORDER — DIPHENHYDRAMINE HCL 25 MG PO CAPS
25.0000 mg | ORAL_CAPSULE | Freq: Every evening | ORAL | Status: DC | PRN
  Administered 2017-03-06: 25 mg via ORAL
  Filled 2017-03-06: qty 1

## 2017-03-06 MED ORDER — OXYCODONE-ACETAMINOPHEN 5-325 MG PO TABS: 1.0000 | ORAL_TABLET | ORAL | 0 refills | Status: DC | PRN

## 2017-03-06 MED ORDER — ZOLPIDEM TARTRATE 5 MG PO TABS
5.0000 mg | ORAL_TABLET | Freq: Every evening | ORAL | Status: DC | PRN
Start: 2017-03-06 — End: 2017-03-06
  Administered 2017-03-06: 5 mg via ORAL
  Filled 2017-03-06: qty 1

## 2017-03-06 NOTE — Discharge Instructions (Signed)
[facility] °[phone] ° °MASTECTOMY: POST OP INSTRUCTIONS ° °Always review your discharge instruction sheet given to you by the facility where your surgery was performed. °IF YOU HAVE DISABILITY OR FAMILY LEAVE FORMS, YOU MUST BRING THEM TO THE OFFICE FOR PROCESSING.   °DO NOT GIVE THEM TO YOUR DOCTOR. °A prescription for pain medication may be given to you upon discharge.  Take your pain medication as prescribed, if needed.  If narcotic pain medicine is not needed, then you may take acetaminophen (Tylenol) or ibuprofen (Advil) as needed. °1. Take your usually prescribed medications unless otherwise directed. °2. If you need a refill on your pain medication, please contact your pharmacy.  They will contact our office to request authorization.  Prescriptions will not be filled after 5pm or on week-ends. °3. You should follow a light diet the first few days after arrival home, such as soup and crackers, etc.  Resume your normal diet the day after surgery. °4. Most patients will experience some swelling and bruising on the chest and underarm.  Ice packs will help.  Swelling and bruising can take several days to resolve.  °5. It is common to experience some constipation if taking pain medication after surgery.  Increasing fluid intake and taking a stool softener (such as Colace) will usually help or prevent this problem from occurring.  A mild laxative (Milk of Magnesia or Miralax) should be taken according to package instructions if there are no bowel movements after 48 hours. °6. Unless discharge instructions indicate otherwise, leave your bandage dry and in place until your next appointment in 3-5 days.  You may take a limited sponge bath.  No tube baths or showers until the drains are removed.  You may have steri-strips (small skin tapes) in place directly over the incision.  These strips should be left on the skin for 7-10 days.  If your surgeon used skin glue on the incision, you may  shower in 24 hours.  The glue will flake off over the next 2-3 weeks.  Any sutures or staples will be removed at the office during your follow-up visit. °7. DRAINS:  If you have drains in place, it is important to keep a list of the amount of drainage produced each day in your drains.  Before leaving the hospital, you should be instructed on drain care.  Call our office if you have any questions about your drains. °8. ACTIVITIES:  You may resume regular (light) daily activities beginning the next day--such as daily self-care, walking, climbing stairs--gradually increasing activities as tolerated.  You may have sexual intercourse when it is comfortable.  Refrain from any heavy lifting or straining until approved by your doctor. °a. You may drive when you are no longer taking prescription pain medication, you can comfortably wear a seatbelt, and you can safely maneuver your car and apply brakes. °b. RETURN TO WORK:  __________________________________________________________ °9. You should see your doctor in the office for a follow-up appointment approximately 3-5 days after your surgery.  Your doctor’s nurse will typically make your follow-up appointment when she calls you with your pathology report.  Expect your pathology report 2-3 business days after your surgery.  You may call to check if you do not hear from us after three days.   °10. OTHER INSTRUCTIONS: ______________________________________________________________________________________________ ____________________________________________________________________________________________ °WHEN TO CALL YOUR DOCTOR: °1. Fever over 101.0 °2. Nausea and/or vomiting °3. Extreme swelling or bruising °4. Continued bleeding from incision. °5. Increased pain, redness, or drainage from the incision. °  The clinic staff is available to answer your questions during regular business hours.  Please don’t hesitate to call and ask to speak to one of the nurses for clinical  concerns.  If you have a medical emergency, go to the nearest emergency room or call 911.  A surgeon from [facility] is always on call at the hospital. °[address], [address], [place], [place]  [address] ? [address] [address], [place], [place]   [address] °[phone] ? [phone] ? FAX [phone] °Web site: [email or web address] °

## 2017-03-06 NOTE — Discharge Summary (Signed)
  Patient ID: Kimberly Waters [id number] 71 y.o. [birth year]  03/05/2017  Discharge date and time: 03/06/2017   Admitting Physician: Excell Seltzer T  Discharge Physician: Excell Seltzer T  Admission Diagnoses: RIGHT BREAST CANCER MULTICENTRIC  Discharge Diagnoses: Same  Operations: Procedure(s): BILATERAL TOTAL MASTECTOMIES WITH RIGHT SENTINEL LYMPH NODE BIOPSIES  Admission Condition: good  Discharged Condition: good  Indication for Admission: Patient is a 71 year old female with a recent diagnosis of clinical stage IIA, stage IIB prognostic multicentric cancer of the right breast. After extensive preoperative discussion regarding treatment options detailed elsewhere we have elected to proceed with bilateral total mastectomy and right axillary sentinel lymph node biopsy. She is brought in for overnight observation following this procedure.  Hospital Course: Patient underwent the above procedure without incident. Her postoperative course was unremarkable. The following morning she is comfortable. Incisions are clean and dry without evidence of bleeding or infection. JP drainage is clearing. Hemoglobin is minimally down and vital signs are stable. She is felt ready for discharge.   Disposition: Home  Patient Instructions:  Allergies as of 03/06/2017      Reactions   Adhesive [tape] Other (See Comments)   (skin tears)  Tolerates PAPER TAPE   Codeine Nausea Only   Other Nausea Only   Anesthesia--nausea       Medication List    TAKE these medications   albuterol 90 MCG/ACT inhaler Commonly known as:  PROVENTIL,VENTOLIN Inhale 2 puffs into the lungs every 4 (four) hours as needed. What changed:  reasons to take this   imipramine 50 MG tablet Commonly known as:  TOFRANIL Take 4 tablets (200 mg total) by mouth at bedtime.   levothyroxine 112 MCG tablet Commonly known as:  SYNTHROID, LEVOTHROID TAKE ONE TABLET BY MOUTH ONCE DAILY What changed:  how much to  take  how to take this  when to take this  additional instructions   lisinopril-hydrochlorothiazide 20-25 MG tablet Commonly known as:  PRINZIDE,ZESTORETIC TAKE ONE TABLET BY MOUTH ONCE DAILY What changed:  how much to take  how to take this  when to take this  additional instructions   metoprolol succinate 25 MG 24 hr tablet Commonly known as:  TOPROL-XL Take 25 mg by mouth daily.   nicotine 21 mg/24hr patch Commonly known as:  NICODERM CQ - dosed in mg/24 hours Place 21 mg onto the skin daily.   omeprazole 20 MG capsule Commonly known as:  PRILOSEC Take 20 mg by mouth daily.   oxyCODONE-acetaminophen 5-325 MG tablet Commonly known as:  PERCOCET/ROXICET Take 1-2 tablets by mouth every 4 (four) hours as needed for moderate pain.   simvastatin 20 MG tablet Commonly known as:  ZOCOR Take 1 tablet (20 mg total) by mouth at bedtime.   TYLENOL PM EXTRA STRENGTH 25-500 MG Tabs tablet Generic drug:  diphenhydramine-acetaminophen Take 2 tablets by mouth at bedtime.       Activity: activity as tolerated Diet: regular diet Wound Care: Instructed to empty JP drains twice daily. May shower.  Follow-up:  With Dr. Excell Seltzer in 1 week.  Signed: Edward Jolly MD, FACS  03/06/2017, 11:52 AM

## 2017-03-06 NOTE — Progress Notes (Signed)
Discharge to home, d/c instructions and follow up appointment done and discussed with patient, daughter at bedside. PIV removed no s/s of infiltration and swelling noted.

## 2017-03-08 ENCOUNTER — Ambulatory Visit: Payer: Medicare Other | Admitting: Oncology

## 2017-03-14 ENCOUNTER — Encounter: Payer: Self-pay | Admitting: Genetic Counselor

## 2017-03-14 ENCOUNTER — Telehealth: Payer: Self-pay | Admitting: Genetic Counselor

## 2017-03-14 DIAGNOSIS — Z1379 Encounter for other screening for genetic and chromosomal anomalies: Secondary | ICD-10-CM | POA: Insufficient documentation

## 2017-03-14 NOTE — Telephone Encounter (Signed)
Revealed negative genetic testing.  Discussed that we do not know why she has breast cancer or why there is cancer in the family. It could be due to a different gene that we are not testing, or maybe our current technology may not be able to pick something up.  It will be important for her to keep in contact with genetics to keep up with whether additional testing may be needed.   We discussed that there were two VUS, one in MSH6 and the other in TSC2.  We will not change her medical management based on these.  We will let her know when they are reclassified.

## 2017-03-19 ENCOUNTER — Telehealth: Payer: Self-pay | Admitting: *Deleted

## 2017-03-19 NOTE — Telephone Encounter (Signed)
Received order per Dr. Jana Hakim for oncotype testing. Requisition sent to pathology. Received by Varney Biles.

## 2017-03-20 ENCOUNTER — Telehealth: Payer: Self-pay | Admitting: Oncology

## 2017-03-20 NOTE — Telephone Encounter (Signed)
sw pt to confirm r/s 4/2 appt to 4/9 at 10 am per LOS

## 2017-03-21 ENCOUNTER — Ambulatory Visit: Payer: Self-pay | Admitting: Genetic Counselor

## 2017-03-21 DIAGNOSIS — Z170 Estrogen receptor positive status [ER+]: Secondary | ICD-10-CM

## 2017-03-21 DIAGNOSIS — Z803 Family history of malignant neoplasm of breast: Secondary | ICD-10-CM

## 2017-03-21 DIAGNOSIS — Z800 Family history of malignant neoplasm of digestive organs: Secondary | ICD-10-CM

## 2017-03-21 DIAGNOSIS — Z1379 Encounter for other screening for genetic and chromosomal anomalies: Secondary | ICD-10-CM

## 2017-03-21 DIAGNOSIS — C50411 Malignant neoplasm of upper-outer quadrant of right female breast: Secondary | ICD-10-CM

## 2017-03-21 NOTE — Progress Notes (Addendum)
HPI:  Kimberly Waters was previously seen in the [place] clinic due to a personal and family history of cancer and concerns regarding a hereditary predisposition to cancer. Please refer to our prior cancer genetics clinic note for more information regarding Kimberly Waters's medical, social and family histories, and our assessment and recommendations, at the time. Kimberly Waters recent genetic test results were disclosed to her, as were recommendations warranted by these results. These results and recommendations are discussed in more detail below.  CANCER HISTORY:    Malignant neoplasm of upper-outer quadrant of right breast in female, estrogen receptor positive ([place])   02/06/2017 Initial Diagnosis    Malignant neoplasm of upper-outer quadrant of right breast in female, estrogen receptor positive ([place])     03/04/2017 Genetic Testing    MSH6 c.3394G>C and TSC1 c.1481C>T VUS identified on the common hereditary cancer panel.  The Hereditary Gene Panel offered by [facility] includes sequencing and/or deletion duplication testing of the following 43 genes: APC, ATM, AXIN2, BARD1, BMPR1A, BRCA1, BRCA2, BRIP1, CDH1, CDKN2A (p14ARF), CDKN2A (p16INK4a), CHEK2, DICER1, EPCAM (Deletion/duplication testing only), GREM1 (promoter region deletion/duplication testing only), KIT, MEN1, MLH1, MSH2, MSH6, MUTYH, NBN, NF1, PALB2, PDGFRA, PMS2, POLD1, POLE, PTEN, RAD50, RAD51C, RAD51D, SDHB, SDHC, SDHD, SMAD4, SMARCA4. STK11, TP53, TSC1, TSC2, and VHL.  The following gene was evaluated for sequence changes only: SDHA and HOXB13 c.251G>A variant only.  The report date is March 04, 2017.        FAMILY HISTORY:  We obtained a detailed, 4-generation family history.  Significant diagnoses are listed below: Family History  Problem Relation Age of Onset   Colon cancer Maternal Grandfather 59   Colon cancer Maternal Aunt 65   Colon cancer Maternal Uncle     dx in his 34s   Breast cancer Paternal Aunt 72   Breast  cancer Cousin 40    paternal first cousin   Lung cancer Paternal Grandfather    Colon cancer Maternal Uncle    Head & neck cancer Maternal Uncle     oral cancer    The patient has two children and a sister who are all cancer free.  Her mother is alive at 38 and reportedly has a right breast lump that is the size of a marble.  They are planning to bring this to the attention of Dr. Inda Merlin at her next visit.  The patient's father died at age 60.  Her mother had two brothers and one sister all who died of colon cancer.  Her mother's parents, are deceased, with her grandfather also having colon cancer at age 50.   The patient's father had two sisters and a brother.  One sister had breast cancer at 11. This sister had a daughter with breast cancer at age 70.  The patient's paternal grandfather died of lung cancer.   Kimberly Waters is unaware of previous family history of genetic testing for hereditary cancer risks. Patient's maternal ancestors are of Caucasian descent, and paternal ancestors are of Caucasian descent. There is no reported Ashkenazi Jewish ancestry. There is no known consanguinity.  GENETIC TEST RESULTS: Genetic testing reported out on March 04, 2017 through the Common Hereditary Cancer panel found no deleterious mutations.  The Hereditary Gene Panel offered by [facility] includes sequencing and/or deletion duplication testing of the following 43 genes: APC, ATM, AXIN2, BARD1, BMPR1A, BRCA1, BRCA2, BRIP1, CDH1, CDKN2A (p14ARF), CDKN2A (p16INK4a), CHEK2, DICER1, EPCAM (Deletion/duplication testing only), GREM1 (promoter region deletion/duplication testing only), KIT, MEN1, MLH1, MSH2, MSH6,  MUTYH, NBN, NF1, PALB2, PDGFRA, PMS2, POLD1, POLE, PTEN, RAD50, RAD51C, RAD51D, SDHB, SDHC, SDHD, SMAD4, SMARCA4. STK11, TP53, TSC1, TSC2, and VHL.  The following gene was evaluated for sequence changes only: SDHA and HOXB13 c.251G>A variant only.  The test report has been scanned into EPIC and is located under  the Molecular Pathology section of the Results Review tab.    We discussed with Kimberly Waters that since the current genetic testing is not perfect, it is possible there may be a gene mutation in one of these genes that current testing cannot detect, but that chance is small.  We also discussed, that it is possible that another gene that has not yet been discovered, or that we have not yet tested, is responsible for the cancer diagnoses in the family, and it is, therefore, important to remain in touch with cancer genetics in the future so that we can continue to offer Kimberly Waters the most up to date genetic testing.   Genetic testing did detect two Variants of Unknown Significance - one in the MSH6 gene called c.3394G>C and another in the TSC2 gene called c.1481C>T. At this time, it is unknown if these variants are associated with increased cancer risk or if this is a normal finding, but most variants such as these get reclassified to being inconsequential. They should not be used to make medical management decisions. With time, we suspect the lab will determine the significance of these variants, if any. If we do learn more about them, we will try to contact Kimberly Waters to discuss it further. However, it is important to stay in touch with [place] periodically and keep the address and phone number up to date.  UPDATE: MSH6 c.3394G>C and TSC1 c.1481C>T VUS have been reclassified to Likely Benign and Benign, respectively.  The amended report date is July 05, 2021.     CANCER SCREENING RECOMMENDATIONS:  This result is reassuring and indicates that Kimberly Waters likely does not have an increased risk for a future cancer due to a mutation in one of these genes. This normal test also suggests that Kimberly Waters's cancer was most likely not due to an inherited predisposition associated with one of these genes.  Most cancers happen by chance and this negative test suggests that her cancer falls into this category.  We,  therefore, recommended she continue to follow the cancer management and screening guidelines provided by her oncology and primary healthcare provider.   RECOMMENDATIONS FOR FAMILY MEMBERS:  Women in this family might be at some increased risk of developing cancer, over the general population risk, simply due to the family history of cancer.  We recommended women in this family have a yearly mammogram beginning at age 59, or 78 years younger than the earliest onset of cancer, an annual clinical breast exam, and perform monthly breast self-exams. Women in this family should also have a gynecological exam as recommended by their primary provider. All family members should have a colonoscopy by age 33.  FOLLOW-UP: Lastly, we discussed with Kimberly Waters that cancer genetics is a rapidly advancing field and it is possible that new genetic tests will be appropriate for her and/or her family members in the future. We encouraged her to remain in contact with cancer genetics on an annual basis so we can update her personal and family histories and let her know of advances in cancer genetics that may benefit this family.   Our contact number was provided. Kimberly Waters questions were answered to her  satisfaction, and she knows she is welcome to call us at anytime with additional questions or concerns.   Roma Kayser, MS, [facility] Certified Genetic Counselor [email or web address]

## 2017-03-24 DIAGNOSIS — C50411 Malignant neoplasm of upper-outer quadrant of right female breast: Secondary | ICD-10-CM | POA: Diagnosis not present

## 2017-03-26 ENCOUNTER — Ambulatory Visit: Payer: Medicare Other | Admitting: Oncology

## 2017-03-26 ENCOUNTER — Telehealth: Payer: Self-pay | Admitting: *Deleted

## 2017-03-26 NOTE — Telephone Encounter (Signed)
Received oncotype score of 6/5%. Physician team notified. Called pt with results. Confirmed f/u appt with Dr. Jana Hakim on 4/9.

## 2017-03-27 ENCOUNTER — Encounter (HOSPITAL_COMMUNITY): Payer: Self-pay

## 2017-04-02 ENCOUNTER — Telehealth: Payer: Self-pay | Admitting: Oncology

## 2017-04-02 ENCOUNTER — Ambulatory Visit (HOSPITAL_BASED_OUTPATIENT_CLINIC_OR_DEPARTMENT_OTHER): Payer: Medicare Other | Admitting: Oncology

## 2017-04-02 ENCOUNTER — Encounter: Payer: Self-pay | Admitting: Radiation Oncology

## 2017-04-02 VITALS — BP 127/76 | HR 92 | Temp 98.2°F | Resp 18 | Ht 61.0 in | Wt 187.5 lb

## 2017-04-02 DIAGNOSIS — C50411 Malignant neoplasm of upper-outer quadrant of right female breast: Secondary | ICD-10-CM

## 2017-04-02 DIAGNOSIS — Z87891 Personal history of nicotine dependence: Secondary | ICD-10-CM

## 2017-04-02 DIAGNOSIS — C50811 Malignant neoplasm of overlapping sites of right female breast: Secondary | ICD-10-CM | POA: Diagnosis not present

## 2017-04-02 DIAGNOSIS — Z170 Estrogen receptor positive status [ER+]: Secondary | ICD-10-CM

## 2017-04-02 DIAGNOSIS — Z9181 History of falling: Secondary | ICD-10-CM

## 2017-04-02 DIAGNOSIS — I10 Essential (primary) hypertension: Secondary | ICD-10-CM | POA: Diagnosis not present

## 2017-04-02 NOTE — Telephone Encounter (Signed)
Gave patient avs report and appointments for May. Patient also given bone density test for 04/24/2017 @ 10 am at [place] - date due to patient out of town 5/5 thru 5/21.

## 2017-04-02 NOTE — Progress Notes (Signed)
[place]  [phone] [phone]     ID: Kimberly Waters DOB: June 17, 1946  MR#: [id number]  YTK#:[id number]  Patient Care Team: Kimberly Huddle, MD as PCP - General (Internal Medicine) Kimberly Craver, MD as Consulting Physician (Gastroenterology) Kimberly Cruel, MD as Consulting Physician (Oncology) Kimberly Seltzer, MD as Consulting Physician (General Surgery) Kimberly Pray, MD as Consulting Physician (Radiation Oncology) Kimberly Cruel, MD OTHER MD:  CHIEF COMPLAINT: Estrogen receptor positive breast cancer  CURRENT TREATMENT: Estrogen receptor positive breast cancer   BREAST CANCER HISTORY: From the original intake note:  Kimberly Waters had an unremarkable mammogram 07/01/2012, after which she did not obtain further mammography. Sometime late 2017 she noted the right nipple was inverted. She brought this to medical attention and on 01/25/2017 she underwent bilateral mammography with tomography and right breast ultrasonography at [facility]. The breast density was category C. In the central right breast there was an area of architectural distortion associated with nipple retraction. Ultrasound confirmed a 2.2 cm irregular mass in the right breast central to the nipple. In addition there was a 2.5 cm irregular mass in the right breast superiorly. The right axilla was sonographically benign.  On 01/29/2017 she underwent biopsy of both these masses, and both showed morphologically similar invasive ductal carcinomas, grade 1 or 2, with evidence of lymphovascular invasion. Both tumors were 100% estrogen receptor positive with strong staining intensity, and 60-100% progesterone receptor positive, with strong staining intensity. The MIP-1 varied from 5-10%. Both tumors were HER-2 negative, with a signals ratio 1.12-1.39, and the number per cell 2.30-3.97.  The patient's subsequent history is as detailed below   INTERVAL HISTORY: Kimberly Waters returns today for follow-up of her  estrogen receptor positive breast cancer accompanied by her daughter. Since her last visit here she underwent bilateral mastectomies with right axillary sentinel lymph node dissection. This was performed 03/05/2017. The final pathology ([id number] found, on the left, no evidence of malignancy. On the right, there was invasive ductal carcinoma measuring up to 5.3 cm, grade 2, with 4 sentinel and 2 non-sentinel lymph nodes all clear..  An Oncotype was obtained on this sample, with a score of 6, predicting a 10 year risk of recurrence outside the breast of 5% if the patient's only systemic therapy is tamoxifen for 5 years.  The patient also underwent genetics testing, which found no deleterious mutations. There were 2 variants of uncertain significance.   REVIEW OF SYSTEMS: She tolerated surgery well. She took Percocet for approximately 10 days, mostly at night, but did not needed beyond that. Unfortunately she did feel woozy and fell, hurting her right ankle, knee, and shoulder. She blames this on her blood pressure medications and has taken herself off all of them. She tells me she is going to be discussing this with her primary care physician Dr. Inda Waters in the next week or so. Aside from these issues she has some heartburn, but there has been no fever or bleeding associated with her surgery. Another bit of news is that she quit smoking 03/03/2017. A detailed review of systems today was otherwise stable    PAST MEDICAL HISTORY: Past Medical History:  Diagnosis Date  . Allergy   . Cancer (HCC)    BREAST  . Colon polyps   . Depression   . Dyspnea    SEASONAL  . Family history of breast cancer   . Family history of colon cancer   . GERD (gastroesophageal reflux disease)   . Hyperlipidemia   . Hypertension   .  Hypothyroidism   . PONV (postoperative nausea and vomiting)   . Thyroid disease     PAST SURGICAL HISTORY: Past Surgical History:  Procedure Laterality Date  . ABDOMINAL  HYSTERECTOMY    . KNEE SURGERY    . MASTECTOMY W/ SENTINEL NODE BIOPSY Right 03/05/2017   Procedure: BILATERAL TOTAL MASTECTOMIES WITH RIGHT SENTINEL LYMPH NODE BIOPSIES;  Surgeon: Kimberly Seltzer, MD;  Location: [facility];  Service: General;  Laterality: Right;  . SHOULDER SURGERY     BILAT    FAMILY HISTORY Family History  Problem Relation Age of Onset  . Colon cancer Maternal Grandfather 84  . Colon cancer Maternal Aunt 76  . Colon cancer Maternal Uncle     dx in his 40s  . Breast cancer Paternal Aunt 77  . Breast cancer Cousin 40    paternal first cousin  . Lung cancer Paternal Grandfather   . Colon cancer Maternal Uncle   . Head & neck cancer Maternal Uncle     oral cancer   The patient's father died at age 69. Her mother is 38 years old as of February 2018. The patient had no brothers, 1 sister. On the mother's side a grandfather, and an uncle all had colon cancer's. On the father's side there is an aunt and a cousin with breast cancer, both diagnosed in their late 34s. There is no history of ovarian cancer in the family.  GYNECOLOGIC HISTORY:  No LMP recorded. Patient has had a hysterectomy.  menarche age 6, first live birth age 17, the patient is [place] P2. She underwent total abdominal hysterectomy with bilateral salpingo-oophorectomy remotely and took hormone replacement for approximately 6 years.   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 Her husband Kimberly Waters ("[address]") [place] is retired from [employer]. Their son Kimberly Waters is an Chief Financial Officer living in [place]. Their daughter Kimberly Waters works at [employer] in [place] the patient has 5 grandchildren. She is a Tourist information centre manager     ADVANCED DIRECTIVES:  not in place    HEALTH MAINTENANCE: Social History  Substance Use Topics  . Smoking status: Current Every Day Smoker    Packs/day: 1.00    Years: 45.00    Types: Cigarettes  . Smokeless tobacco:  Never Used  . Alcohol use No     Colonoscopy: 03/18/2014/[facility]  PAP: Status post hysterectomy  Bone density: Never    Allergies  Allergen Reactions  . Adhesive [Tape] Other (See Comments)    (skin tears)  Tolerates PAPER TAPE  . Codeine Nausea Only  . Other Nausea Only    Anesthesia--nausea     Current Outpatient Prescriptions  Medication Sig Dispense Refill  . albuterol (PROVENTIL,VENTOLIN) 90 MCG/ACT inhaler Inhale 2 puffs into the lungs every 4 (four) hours as needed. (Patient taking differently: Inhale 2 puffs into the lungs every 4 (four) hours as needed for wheezing or shortness of breath. ) 17 g 3  . diphenhydramine-acetaminophen (TYLENOL PM EXTRA STRENGTH) 25-500 MG TABS tablet Take 2 tablets by mouth at bedtime.    Marland Kitchen imipramine (TOFRANIL) 50 MG tablet Take 4 tablets (200 mg total) by mouth at bedtime. 360 tablet 0  . levothyroxine (SYNTHROID, LEVOTHROID) 112 MCG tablet TAKE ONE TABLET BY MOUTH ONCE DAILY (Patient taking differently: Take 112 mcg by mouth daily before breakfast. ) 30 tablet 0  . omeprazole (PRILOSEC) 20 MG capsule Take 20 mg by mouth daily.    . simvastatin (ZOCOR) 20  MG tablet Take 1 tablet (20 mg total) by mouth at bedtime. 90 tablet 0   No current facility-administered medications for this visit.   A  OBJECTIVE: Middle-aged white woman who appears stated age 70:   04/02/17 1045  BP: 127/76  Pulse: 92  Resp: 18  Temp: 98.2 F (36.8 C)     Body mass index is 35.43 kg/m.    ECOG FS:1 - Symptomatic but completely ambulatory  Sclerae unicteric, pupils round and equal Oropharynx clear and moist No cervical or supraclavicular adenopathy Lungs no rales or rhonchi Heart regular rate and rhythm Abd soft, nontender, positive bowel sounds MSK no focal spinal tenderness, no upper extremity lymphedema, limited range of motion right upper extremity Neuro: nonfocal, well oriented, appropriate affect Breasts: Status post bilateral mastectomies. There is  some erythema on the right side, not on the left. Both axillae are benign.    LAB RESULTS:  CMP     Component Value Date/Time   NA 136 03/06/2017 0804   NA 139 02/14/2017 1503   K 4.3 03/06/2017 0804   K 3.9 02/14/2017 1503   CL 105 03/06/2017 0804   CO2 26 03/06/2017 0804   CO2 25 02/14/2017 1503   GLUCOSE 89 03/06/2017 0804   GLUCOSE 110 02/14/2017 1503   BUN 23 (H) 03/06/2017 0804   BUN 21.4 02/14/2017 1503   CREATININE 1.39 (H) 03/06/2017 0804   CREATININE 1.4 (H) 02/14/2017 1503   CALCIUM 8.3 (L) 03/06/2017 0804   CALCIUM 9.7 02/14/2017 1503   PROT 7.8 02/14/2017 1503   ALBUMIN 3.8 02/14/2017 1503   AST 12 02/14/2017 1503   ALT 17 02/14/2017 1503   ALKPHOS 66 02/14/2017 1503   BILITOT 0.37 02/14/2017 1503   GFRNONAA 38 (L) 03/06/2017 0804   GFRAA 44 (L) 03/06/2017 0804    INo results found for: SPEP, UPEP  Lab Results  Component Value Date   WBC 11.2 (H) 03/06/2017   NEUTROABS 7.4 (H) 02/14/2017   HGB 10.4 (L) 03/06/2017   HCT 32.7 (L) 03/06/2017   MCV 94.5 03/06/2017   PLT 230 03/06/2017      Chemistry      Component Value Date/Time   NA 136 03/06/2017 0804   NA 139 02/14/2017 1503   K 4.3 03/06/2017 0804   K 3.9 02/14/2017 1503   CL 105 03/06/2017 0804   CO2 26 03/06/2017 0804   CO2 25 02/14/2017 1503   BUN 23 (H) 03/06/2017 0804   BUN 21.4 02/14/2017 1503   CREATININE 1.39 (H) 03/06/2017 0804   CREATININE 1.4 (H) 02/14/2017 1503      Component Value Date/Time   CALCIUM 8.3 (L) 03/06/2017 0804   CALCIUM 9.7 02/14/2017 1503   ALKPHOS 66 02/14/2017 1503   AST 12 02/14/2017 1503   ALT 17 02/14/2017 1503   BILITOT 0.37 02/14/2017 1503       No results found for: LABCA2  No components found for: LABCA125  No results for input(s): INR in the last 168 hours.  Urinalysis No results found for: COLORURINE, APPEARANCEUR, LABSPEC, PHURINE, GLUCOSEU, HGBUR, BILIRUBINUR, KETONESUR, PROTEINUR, UROBILINOGEN, NITRITE, LEUKOCYTESUR   STUDIES: No  results found.  ELIGIBLE FOR AVAILABLE RESEARCH PROTOCOL: no  ASSESSMENT: 71 y.o[place], [place]  Woman status post biopsy of 2 separate masses in the central right breast 01/29/2017, showing a clinical mT2 N0 invasive ductal carcinoma, stage IB in the new classification: Both masses being estrogen receptor and progesterone receptor positive, HER-2 not amplified, with an MIB-1 between 5  and 10%.  (1) status post bilateral mastectomies showing  (a) on the left, no malignancy  (b) On the right, a pT3 pN0, stage IIA invasive ductal carcinoma, grade 2,  with negative margins    (c) patient is not interested in reconstruction  (2) Oncotype score of 6 predicts a 10 year risk of recurrence outside the breast of 5% if the patient's only systemic therapy is tamoxifen for 5 years. It also predicts no benefit from chemotherapy.  (3) adjuvant radiation as appropriate  (4) adjuvant anti-estrogens to follow at the completion of local treatment   (5) genetics testing through the Hereditary Gene Panel offered by [facility] i found no deleterious mutations in APC, ATM, AXIN2, BARD1, BMPR1A, BRCA1, BRCA2, BRIP1, CDH1, CDKN2A (p14ARF), CDKN2A (p16INK4a), CHEK2, DICER1, EPCAM (Deletion/duplication testing only), GREM1 (promoter region deletion/duplication testing only), KIT, MEN1, MLH1, MSH2, MSH6, MUTYH, NBN, NF1, PALB2, PDGFRA, PMS2, POLD1, POLE, PTEN, RAD50, RAD51C, RAD51D, SDHB, SDHC, SDHD, SMAD4, SMARCA4. STK11, TP53, TSC1, TSC2, and VHL.  The following gene was evaluated for sequence changes only: SDHA and HOXB13 c.251G>A variant only  (a) 2 were identified: VUS  MSH6 c.3394G>C and TSC1 c.1481C>T  (6) tobacco abuse: Patient quit smoking 03/03/2017   PLAN: I spent approximately 30 minutes with Kimberly Waters with most of that time spent discussing her situation. We reviewed her pathology report, which is generally favorable. She understands that she had 6 lymph nodes removed from her right axilla and therefore she  needs to protect that arm. It will be at risk for lymphedema and cellulitis.  We also noted that her tumor was greater than 5 cm and this means she may benefit from adjuvant radiation. I am making an appointment for her with Dr. Sondra Come to discuss that in more detail.  We reviewed her genetics which showed 2 variants of uncertain significance. She understands we all have such changes in our genome and that they do not necessarily indicate any pathology. The mutations we were concerned about because of her family history were not in fact present. I was very favorable.  Finally we reviewed her Oncotype results which show absolutely no predicted benefit for chemotherapy. She will look greatly benefit from anti-estrogens and to help [place] decide on which agent to use I am putting her in for a DEXA scan at [facility] before her return visit with me which will be in approximately a month  I commended her on her quitting smoking.  She has taken herself off all her blood pressure medications because of her wooziness and fall. She will be discussing this with Dr. Inda Waters at her next visit with him later this month  She knows to call for any other problems that may develop before her next visit here.     :Kimberly Cruel, MD   04/02/2017 10:54 AM Medical Oncology and Hematology [facility] [place], [place] [address] Tel. [phone]    Fax. [phone]

## 2017-04-05 DIAGNOSIS — Z9013 Acquired absence of bilateral breasts and nipples: Secondary | ICD-10-CM | POA: Diagnosis not present

## 2017-04-06 NOTE — Progress Notes (Signed)
Location of Breast Cancer: upper-outer quadrant of right breast   Histology per Pathology Report:   03/05/17 Diagnosis 1. Breast, simple mastectomy, Left Total - FIBROCYSTIC CHANGES WITH CALCIFICATIONS AND FOCAL SCLEROSING ADENOSIS. - NO EVIDENCE OF MALIGNANCY. 2. Breast, simple mastectomy, Right Total - INVASIVE AND IN SITU DUCTAL CARCINOMA, 5.3 CM. - MARGINS NOT INVOLVED. - PREVIOUS BIOPSY SITES. - FIBROCYSTIC CHANGES. - ONE BENIGN LYMPH NODE (0/1). - SEE ONCLOLGY TABLE AND COMMENT. 3. Lymph node, sentinel, biopsy, Right Axillary #1 - ONE BENIGN LYMPH NODE (0/1). 4. Lymph node, biopsy, Right Axillary - ONE BENIGN LYMPH NODE (0/1). 5. Lymph node, sentinel, biopsy, Right Axillary #2 - ONE BENIGN LYMPH NODE (0/1). 6. Lymph node, sentinel, biopsy, Right Axillary #3 - ONE BENIGN LYMPH NODE (0/1). 7. Lymph node, sentinel, biopsy, Right Axillary #4 - ONE BENIGN LYMPH NODE (0/1).  01/29/17 Diagnosis 1. Breast, right, needle core biopsy, mass 12:00 o'clock 4 cmfn - INVASIVE AND IN SITU MAMMARY CARCINOMA. - LYMPHOVASCULAR INVASION IS IDENTIFIED. - SEE COMMENT. 2. Breast, right, needle core biopsy, mass 12:00 o'clock retroareolar, palpable - INVASIVE AND IN SITU MAMMARY CARCINOMA. - SEE COMMENT.  Receptor Status: ER(100%), PR (60%), Her2-neu (negative)  Did patient present with symptoms (if so, please note symptoms) or was this found on screening mammography?: screening mammogram  Past/Anticipated interventions by surgeon, if any: 03/05/17 - Procedure: BILATERAL TOTAL MASTECTOMIES WITH RIGHT SENTINEL LYMPH NODE BIOPSIES;  Surgeon: Excell Seltzer, MD;  Location: [facility];  Service: General;  Laterality: Right;  Past/Anticipated interventions by medical oncology, if any: no  Lymphedema issues, if any:  no   Pain issues, if any:  no   SAFETY ISSUES:  Prior radiation? Yes - had radiation to her ears when she was a baby  Pacemaker/ICD? no  Possible current pregnancy?no  Is the  patient on methotrexate? no  Current Complaints / other details:  Patient is here with her daughter.  She reports that she has an infection in her right side.  She said Dr. Excell Seltzer drained fluid last Thursday.  She is also taking Augmentin.  BP 137/68 (BP Location: Left Arm, Patient Position: Sitting)   Pulse 73   Temp 98.2 F (36.8 C) (Oral)   Ht _0  (1.549 m)   Wt 191 lb 6.4 oz (86.8 kg)   SpO2 98%   BMI 36.16 kg/m    Wt Readings from Last 3 Encounters:  04/11/17 191 lb 6.4 oz (86.8 kg)  04/02/17 187 lb 8 oz (85 kg)  03/05/17 191 lb (86.6 kg)      Caitlynn Ju, Craige Cotta, RN 04/06/2017,10:15 AM

## 2017-04-11 ENCOUNTER — Ambulatory Visit
Admission: RE | Admit: 2017-04-11 | Discharge: 2017-04-11 | Disposition: A | Discharge status: Home / Self Care | Payer: Medicare Other | Source: Ambulatory Visit | Attending: Radiation Oncology | Admitting: Radiation Oncology

## 2017-04-11 ENCOUNTER — Encounter: Payer: Self-pay | Admitting: Radiation Oncology

## 2017-04-11 VITALS — BP 137/68 | HR 73 | Temp 98.2°F | Ht 61.0 in | Wt 191.4 lb

## 2017-04-11 DIAGNOSIS — C50411 Malignant neoplasm of upper-outer quadrant of right female breast: Secondary | ICD-10-CM | POA: Diagnosis not present

## 2017-04-11 DIAGNOSIS — Z170 Estrogen receptor positive status [ER+]: Secondary | ICD-10-CM | POA: Insufficient documentation

## 2017-04-11 DIAGNOSIS — Z510 Encounter for antineoplastic radiation therapy: Secondary | ICD-10-CM | POA: Diagnosis not present

## 2017-04-11 DIAGNOSIS — Z885 Allergy status to narcotic agent status: Secondary | ICD-10-CM | POA: Insufficient documentation

## 2017-04-11 DIAGNOSIS — Z9013 Acquired absence of bilateral breasts and nipples: Secondary | ICD-10-CM | POA: Diagnosis not present

## 2017-04-11 DIAGNOSIS — Z79899 Other long term (current) drug therapy: Secondary | ICD-10-CM | POA: Insufficient documentation

## 2017-04-11 NOTE — Progress Notes (Signed)
Radiation Oncology         [phone] ________________________________  Name: Kimberly Waters MRN: [id number]  Date: 04/11/2017  DOB: 09-20-1946  Re-consultation Note  CC: Henrine Screws, MD  Magrinat, Virgie Dad, MD    ICD-9-CM ICD-10-CM   1. Malignant neoplasm of upper-outer quadrant of right breast in female, estrogen receptor positive ([place]) 174.4 C50.411    V86.0 Z17.0     Diagnosis:  Clinical Stage mT2, N0, M0 Invasive Ductal Carcinoma of the Right Breast, ER/PR Positive, HER2 Negative, Grade 1-2.  Narrative:  The patient returns today for re-evaluation. Since breast clinic on 02/07/17, the patient underwent a simple mastectomy of the left and right breast. This was performed on 03/05/17. Left breast mastectomy revealed fibrocystic changes with calcifications and no evidence of malignancy. Right breast mastectomy revealed invasive and in situ ductal carcinoma, 5.3 cm with no marginal involvement. Sentinel lymph node biopsy showed 6 benign lymph nodes (0/6).  Patient's daughter reports the patient had an infection of the right breast 2 weeks ago. She was seen by Dr. Excell Seltzer on 04/05/17 to drain fluid. She is currently taking Augmentin.  Patient denies lymphedema or pain at this time.                   ALLERGIES:  is allergic to adhesive [tape]; codeine; and other.  Meds: Current Outpatient Prescriptions  Medication Sig Dispense Refill  . albuterol (PROVENTIL,VENTOLIN) 90 MCG/ACT inhaler Inhale 2 puffs into the lungs every 4 (four) hours as needed. (Patient taking differently: Inhale 2 puffs into the lungs every 4 (four) hours as needed for wheezing or shortness of breath. ) 17 g 3  . amoxicillin-clavulanate (AUGMENTIN) 500-125 MG tablet Take 1 tablet by mouth 3 (three) times daily.    . diphenhydramine-acetaminophen (TYLENOL PM EXTRA STRENGTH) 25-500 MG TABS tablet Take 2 tablets by mouth at bedtime.    Marland Kitchen imipramine (TOFRANIL) 50 MG tablet Take 4 tablets (200 mg total) by mouth  at bedtime. 360 tablet 0  . levothyroxine (SYNTHROID, LEVOTHROID) 112 MCG tablet TAKE ONE TABLET BY MOUTH ONCE DAILY (Patient taking differently: Take 112 mcg by mouth daily before breakfast. ) 30 tablet 0  . omeprazole (PRILOSEC) 20 MG capsule Take 20 mg by mouth daily.    . simvastatin (ZOCOR) 20 MG tablet Take 1 tablet (20 mg total) by mouth at bedtime. 90 tablet 0  . metoprolol succinate (TOPROL-XL) 25 MG 24 hr tablet      No current facility-administered medications for this encounter.     Physical Findings: The patient is in no acute distress. Patient is alert and oriented.  height is 5' 1"  (1.549 m) and weight is 191 lb 6.4 oz (86.8 kg). Her oral temperature is 98.2 F (36.8 C). Her blood pressure is 137/68 and her pulse is 73. Her oxygen saturation is 98%.   No significant changes. Lungs are clear to auscultation bilaterally. Heart has regular rate and rhythm. No palpable cervical, supraclavicular, or axillary adenopathy. Abdomen soft, non-tender, normal bowel sounds. Left chest patient has a mastectomy scar that is healing well without signs of drainage or infection. Right breast there is a mastectomy scar, some mild erythema, no active drainage, possibly some fluid in the right axillary region.    Lab Findings: Lab Results  Component Value Date   WBC 11.2 (H) 03/06/2017   HGB 10.4 (L) 03/06/2017   HCT 32.7 (L) 03/06/2017   MCV 94.5 03/06/2017   PLT 230 03/06/2017    Radiographic Findings:  No results found.  Impression: Clinical Stage (mT2, N0, M0) Pathologic (pT3, pN0) Invasive Ductal Carcinoma of the Right Breast, ER/PR Positive, HER2 Negative, Grade 1-2. The patient was found to have a large tumor (T3) and therefore would be at risk for local recurrence. I discussed the consideration for post mastectomy radiation. She appears receptive to this issue. I will explore in more detail her risk of recurrence given the low grade tumor, widely clear margins (2 cm), and no positive  lymph nodes.  She appears to understand and she is receptive for treatment. She will be out of town for most of May. In light of her recent infection and mastectomy, I feel it would be reasonable to delay her start of treatment to the first week of June when she returns from vacation. She also has some problems with raising her right arm. She was provided with exercises to improve this issue so she will be ready to start early June.   Plan: Discuss further risk for recurrence with colleagues. Assuming the risk is high enough for recurrence, proceed with simulation in early June.   ____________________________________   This document serves as a record of services personally performed by Gery Pray, MD. It was created on his behalf by Bethann Humble, a trained medical scribe. The creation of this record is based on the scribe's personal observations and the provider's statements to them. This document has been checked and approved by the attending provider.

## 2017-04-11 NOTE — Progress Notes (Signed)
Please see the Nurse Progress Note in the MD Initial Consult Encounter for this patient. 

## 2017-04-16 ENCOUNTER — Telehealth: Payer: Self-pay | Admitting: Oncology

## 2017-04-16 NOTE — Telephone Encounter (Signed)
Called patient back and advised her that it will be tomorrow or Wednesday before a decision is made and that we will call her back.  Patient verbalized understanding and agreement.

## 2017-04-16 NOTE — Telephone Encounter (Signed)
Patient left a message asking if she needs radiation.  She would like a return call at [phone].

## 2017-04-19 ENCOUNTER — Telehealth: Payer: Self-pay | Admitting: Oncology

## 2017-04-19 NOTE — Telephone Encounter (Signed)
Patient left a message asking about her plan of treatment.  She would like Dr. Sondra Come to call her back.

## 2017-04-20 ENCOUNTER — Telehealth: Payer: Self-pay | Admitting: Oncology

## 2017-04-20 NOTE — Telephone Encounter (Signed)
Called patient to see when she will be returning from vacation so that we can schedule her CT Simulation.  She said she will be available the week of June 11.  Advised her that we will be calling her back with an appointment for the week of June 11.  She verbalized understanding and agreement.

## 2017-04-24 DIAGNOSIS — Z780 Asymptomatic menopausal state: Secondary | ICD-10-CM | POA: Diagnosis not present

## 2017-05-15 ENCOUNTER — Ambulatory Visit (HOSPITAL_BASED_OUTPATIENT_CLINIC_OR_DEPARTMENT_OTHER): Payer: Medicare Other | Admitting: Oncology

## 2017-05-15 ENCOUNTER — Other Ambulatory Visit (HOSPITAL_BASED_OUTPATIENT_CLINIC_OR_DEPARTMENT_OTHER): Payer: Medicare Other

## 2017-05-15 VITALS — BP 145/63 | HR 65 | Temp 97.6°F | Resp 20 | Ht 61.0 in | Wt 189.0 lb

## 2017-05-15 DIAGNOSIS — Z87891 Personal history of nicotine dependence: Secondary | ICD-10-CM

## 2017-05-15 DIAGNOSIS — C50411 Malignant neoplasm of upper-outer quadrant of right female breast: Secondary | ICD-10-CM

## 2017-05-15 DIAGNOSIS — F419 Anxiety disorder, unspecified: Secondary | ICD-10-CM | POA: Diagnosis not present

## 2017-05-15 DIAGNOSIS — Z170 Estrogen receptor positive status [ER+]: Secondary | ICD-10-CM

## 2017-05-15 DIAGNOSIS — C50811 Malignant neoplasm of overlapping sites of right female breast: Secondary | ICD-10-CM | POA: Diagnosis not present

## 2017-05-15 LAB — CBC WITH DIFFERENTIAL/PLATELET
BASO%: 0.2 % (ref 0.0–2.0)
BASOS ABS: 0 10*3/uL (ref 0.0–0.1)
EOS%: 2 % (ref 0.0–7.0)
Eosinophils Absolute: 0.1 10*3/uL (ref 0.0–0.5)
HEMATOCRIT: 37.6 % (ref 34.8–46.6)
HGB: 12 g/dL (ref 11.6–15.9)
LYMPH#: 1.9 10*3/uL (ref 0.9–3.3)
LYMPH%: 29.9 % (ref 14.0–49.7)
MCH: 29.6 pg (ref 25.1–34.0)
MCHC: 31.9 g/dL (ref 31.5–36.0)
MCV: 92.6 fL (ref 79.5–101.0)
MONO#: 0.4 10*3/uL (ref 0.1–0.9)
MONO%: 5.8 % (ref 0.0–14.0)
NEUT#: 4 10*3/uL (ref 1.5–6.5)
NEUT%: 62.1 % (ref 38.4–76.8)
Platelets: 273 10*3/uL (ref 145–400)
RBC: 4.06 10*6/uL (ref 3.70–5.45)
RDW: 13.3 % (ref 11.2–14.5)
WBC: 6.4 10*3/uL (ref 3.9–10.3)

## 2017-05-15 LAB — COMPREHENSIVE METABOLIC PANEL
ALT: 18 U/L (ref 0–55)
ANION GAP: 8 meq/L (ref 3–11)
AST: 14 U/L (ref 5–34)
Albumin: 3.6 g/dL (ref 3.5–5.0)
Alkaline Phosphatase: 63 U/L (ref 40–150)
BUN: 15.6 mg/dL (ref 7.0–26.0)
CALCIUM: 9.4 mg/dL (ref 8.4–10.4)
CHLORIDE: 104 meq/L (ref 98–109)
CO2: 26 meq/L (ref 22–29)
Creatinine: 1.2 mg/dL — ABNORMAL HIGH (ref 0.6–1.1)
EGFR: 46 mL/min/{1.73_m2} — ABNORMAL LOW (ref >90)
Glucose: 92 mg/dl (ref 70–140)
POTASSIUM: 4 meq/L (ref 3.5–5.1)
Sodium: 138 mEq/L (ref 136–145)
Total Bilirubin: 0.36 mg/dL (ref 0.20–1.20)
Total Protein: 7.3 g/dL (ref 6.4–8.3)

## 2017-05-15 MED ORDER — ANASTROZOLE 1 MG PO TABS: 1.0000 mg | ORAL_TABLET | Freq: Every day | ORAL | 4 refills | Status: DC

## 2017-05-15 NOTE — Progress Notes (Signed)
[place]  [phone] [phone]     ID: Kimberly Waters DOB: 1946-07-13  MR#: [id number]  CSN#:[id number]  Patient Care Team: Kimberly Huddle, MD as PCP - General (Internal Medicine) Kimberly Craver, MD as Consulting Physician (Gastroenterology) Kimberly Waters, Kimberly Dad, MD as Consulting Physician (Oncology) Kimberly Seltzer, MD as Consulting Physician (General Surgery) Kimberly Pray, MD as Consulting Physician (Radiation Oncology) Kimberly Cruel, MD OTHER MD:  CHIEF COMPLAINT: Estrogen receptor positive breast cancer  CURRENT TREATMENT: Anastrozole, pending adjuvant radiation   BREAST CANCER HISTORY: From the original intake note:  Kimberly Waters had an unremarkable mammogram 07/01/2012, after which she did not obtain further mammography. Sometime late 2017 she noted the right nipple was inverted. She brought this to medical attention and on 01/25/2017 she underwent bilateral mammography with tomography and right breast ultrasonography at [facility]. The breast density was category C. In the central right breast there was an area of architectural distortion associated with nipple retraction. Ultrasound confirmed a 2.2 cm irregular mass in the right breast central to the nipple. In addition there was a 2.5 cm irregular mass in the right breast superiorly. The right axilla was sonographically benign.  On 01/29/2017 she underwent biopsy of both these masses, and both showed morphologically similar invasive ductal carcinomas, grade 1 or 2, with evidence of lymphovascular invasion. Both tumors were 100% estrogen receptor positive with strong staining intensity, and 60-100% progesterone receptor positive, with strong staining intensity. The MIP-1 varied from 5-10%. Both tumors were HER-2 negative, with a signals ratio 1.12-1.39, and the number per cell 2.30-3.97.  The patient's subsequent history is as detailed below   INTERVAL HISTORY: Kimberly Waters returns today for follow-up of her  estrogen receptor positive breast cancer accompanied by her son Kimberly Waters. She continues on anastrozole, which she generally tolerates well. Hot flashes and vaginal dryness are not major issues.  Her radiation has been postponed at least twice but is now scheduled to start in June.  REVIEW OF SYSTEMS: She has mild seasonal allergies. She complains of sluggishness which she feels may be related to her thyroid. She did manage to quit smoking in March 2018 and that is very favorable. He detailed review of systems today was otherwise stable    PAST MEDICAL HISTORY: Past Medical History:  Diagnosis Date  . Allergy   . Cancer (HCC)    BREAST  . Colon polyps   . Depression   . Dyspnea    SEASONAL  . Family history of breast cancer   . Family history of colon cancer   . GERD (gastroesophageal reflux disease)   . Hyperlipidemia   . Hypertension   . Hypothyroidism   . PONV (postoperative nausea and vomiting)   . Thyroid disease     PAST SURGICAL HISTORY: Past Surgical History:  Procedure Laterality Date  . ABDOMINAL HYSTERECTOMY    . KNEE SURGERY    . MASTECTOMY W/ SENTINEL NODE BIOPSY Right 03/05/2017   Procedure: BILATERAL TOTAL MASTECTOMIES WITH RIGHT SENTINEL LYMPH NODE BIOPSIES;  Surgeon: Kimberly Seltzer, MD;  Location: [facility];  Service: General;  Laterality: Right;  . SHOULDER SURGERY     BILAT    FAMILY HISTORY Family History  Problem Relation Age of Onset  . Colon cancer Maternal Grandfather 60  . Colon cancer Maternal Aunt 81  . Colon cancer Maternal Uncle        dx in his 34s  . Breast cancer Paternal Aunt 22  . Breast cancer Cousin 47  paternal first cousin  . Lung cancer Paternal Grandfather   . Colon cancer Maternal Uncle   . Head & neck cancer Maternal Uncle        oral cancer   The patient's father died at age 61. Her mother is 17 years old as of February 2018. The patient had no brothers, 1 sister. On the mother's side a grandfather, and an uncle all had  colon cancer's. On the father's side there is an aunt and a cousin with breast cancer, both diagnosed in their late 74s. There is no history of ovarian cancer in the family.  GYNECOLOGIC HISTORY:  No LMP recorded. Patient has had a hysterectomy.  menarche age 73, first live birth age 68, the patient is Kimberly Waters. She underwent total abdominal hysterectomy with bilateral salpingo-oophorectomy remotely and took hormone replacement for approximately 6 years.   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 Her husband Kimberly Waters ("[address] Kimberly Waters is retired from [employer]. Their son Kimberly Waters is an Chief Financial Officer living in [place]. Their daughter Kimberly Waters works at [employer] in [place] the patient has 5 grandchildren. She is a Tourist information centre manager     ADVANCED DIRECTIVES:  not in place    HEALTH MAINTENANCE: Social History  Substance Use Topics  . Smoking status: Former Smoker    Packs/day: 1.00    Years: 45.00    Types: Cigarettes    Quit date: 02/22/2017  . Smokeless tobacco: Never Used  . Alcohol use No     Colonoscopy: 03/18/2014/[facility]  PAP: Status post hysterectomy  Bone density: Never    Allergies  Allergen Reactions  . Adhesive [Tape] Other (See Comments)    (skin tears)  Tolerates PAPER TAPE  . Codeine Nausea Only  . Other Nausea Only    Anesthesia--nausea     Current Outpatient Prescriptions  Medication Sig Dispense Refill  . albuterol (PROVENTIL,VENTOLIN) 90 MCG/ACT inhaler Inhale 2 puffs into the lungs every 4 (four) hours as needed. (Patient taking differently: Inhale 2 puffs into the lungs every 4 (four) hours as needed for wheezing or shortness of breath. ) 17 g 3  . anastrozole (ARIMIDEX) 1 MG tablet Take 1 tablet (1 mg total) by mouth daily. 90 tablet 4  . diphenhydramine-acetaminophen (TYLENOL PM EXTRA STRENGTH) 25-500 MG TABS tablet Take 2 tablets by mouth at bedtime.    Marland Kitchen imipramine (TOFRANIL) 50 MG  tablet Take 4 tablets (200 mg total) by mouth at bedtime. 360 tablet 0  . levothyroxine (SYNTHROID, LEVOTHROID) 112 MCG tablet TAKE ONE TABLET BY MOUTH ONCE DAILY (Patient taking differently: Take 112 mcg by mouth daily before breakfast. ) 30 tablet 0  . metoprolol succinate (TOPROL-XL) 25 MG 24 hr tablet     . omeprazole (PRILOSEC) 20 MG capsule Take 20 mg by mouth daily.    . simvastatin (ZOCOR) 20 MG tablet Take 1 tablet (20 mg total) by mouth at bedtime. 90 tablet 0   No current facility-administered medications for this visit.   A  OBJECTIVE: Middle-aged white woman In no acute distress Vitals:   05/15/17 1203  BP: (!) 145/63  Pulse: 65  Resp: 20  Temp: 97.6 F (36.4 C)     Body mass index is 35.71 kg/m.    ECOG FS:1 - Symptomatic but completely ambulatory  Sclerae unicteric, EOMs intact Oropharynx clear and moist No cervical or supraclavicular adenopathy Lungs no rales or rhonchi Heart regular rate and rhythm Abd soft,  nontender, positive bowel sounds MSK no focal spinal tenderness, no upper extremity lymphedema Neuro: nonfocal, well oriented, appropriate affect Breasts: She is status post bilateral mastectomies. There is no evidence of local recurrence. Both axillae are benign.   LAB RESULTS:  CMP     Component Value Date/Time   NA 138 05/15/2017 1147   K 4.0 05/15/2017 1147   CL 105 03/06/2017 0804   CO2 26 05/15/2017 1147   GLUCOSE 92 05/15/2017 1147   BUN 15.6 05/15/2017 1147   CREATININE 1.2 (H) 05/15/2017 1147   CALCIUM 9.4 05/15/2017 1147   PROT 7.3 05/15/2017 1147   ALBUMIN 3.6 05/15/2017 1147   AST 14 05/15/2017 1147   ALT 18 05/15/2017 1147   ALKPHOS 63 05/15/2017 1147   BILITOT 0.36 05/15/2017 1147   GFRNONAA 38 (L) 03/06/2017 0804   GFRAA 44 (L) 03/06/2017 0804    INo results found for: SPEP, UPEP  Lab Results  Component Value Date   WBC 6.4 05/15/2017   NEUTROABS 4.0 05/15/2017   HGB 12.0 05/15/2017   HCT 37.6 05/15/2017   MCV 92.6  05/15/2017   PLT 273 05/15/2017      Chemistry      Component Value Date/Time   NA 138 05/15/2017 1147   K 4.0 05/15/2017 1147   CL 105 03/06/2017 0804   CO2 26 05/15/2017 1147   BUN 15.6 05/15/2017 1147   CREATININE 1.2 (H) 05/15/2017 1147      Component Value Date/Time   CALCIUM 9.4 05/15/2017 1147   ALKPHOS 63 05/15/2017 1147   AST 14 05/15/2017 1147   ALT 18 05/15/2017 1147   BILITOT 0.36 05/15/2017 1147       No results found for: LABCA2  No components found for: LABCA125  No results for input(s): INR in the last 168 hours.  Urinalysis No results found for: COLORURINE, APPEARANCEUR, LABSPEC, PHURINE, GLUCOSEU, HGBUR, BILIRUBINUR, KETONESUR, PROTEINUR, UROBILINOGEN, NITRITE, LEUKOCYTESUR   STUDIES: No results found.  ELIGIBLE FOR AVAILABLE RESEARCH PROTOCOL: no  ASSESSMENT: 71 y.o.[place], [place]  Woman status post biopsy of 2 separate masses in the central right breast 01/29/2017, showing a clinical mT2 N0 invasive ductal carcinoma, stage IB in the new classification: Both masses being estrogen receptor and progesterone receptor positive, HER-2 not amplified, with an MIB-1 between 5 and 10%.  (1) status post bilateral mastectomies 03/05/2017 showing  (a) on the left, no malignancy  (b) On the right, a pT3 pN0, stage IIA invasive ductal carcinoma, grade 2,  with negative margins    (c) patient is not interested in reconstruction  (2) Oncotype score of 6 predicts a 10 year risk of recurrence outside the breast of 5% if the patient's only systemic therapy is tamoxifen for 5 years. It also predicts no benefit from chemotherapy.  (3) adjuvant radiation as appropriate  (4) adjuvant anti-estrogens to follow at the completion of local treatment   (5) genetics testing through the Hereditary Gene Panel offered by [facility] i found no deleterious mutations in APC, ATM, AXIN2, BARD1, BMPR1A, BRCA1, BRCA2, BRIP1, CDH1, CDKN2A (p14ARF), CDKN2A (p16INK4a), CHEK2, DICER1,  EPCAM (Deletion/duplication testing only), GREM1 (promoter region deletion/duplication testing only), KIT, MEN1, MLH1, MSH2, MSH6, MUTYH, NBN, NF1, PALB2, PDGFRA, PMS2, POLD1, POLE, PTEN, RAD50, RAD51C, RAD51D, SDHB, SDHC, SDHD, SMAD4, SMARCA4. STK11, TP53, TSC1, TSC2, and VHL.  The following gene was evaluated for sequence changes only: SDHA and HOXB13 c.251G>A variant only  (a) 2 were identified: VUS  MSH6 c.3394G>C and TSC1 c.1481C>T  (6) tobacco  abuse: Patient quit smoking 03/03/2017   PLAN: Brookley has had a hard time getting to radiation but she is finally going to be starting in June, we expect. In the meantime she has been treated with anastrozole, and has tolerated it well. The plan will be to continue that to a total of 5 years  We again reviewed the possible toxicities, side effects and complications of anti-estrogens. She is actually doing quite well with this medication.  She also expressed anxiety regarding the upcoming radiation. We discussed that at length as well.  Hopefully she will be able to complete her radiation treatments being scheduled, and return to see me in 2 months. At that time we should be able to start routine follow-up.  She knows to call for any problems that may develop before her next visit.  :Kimberly Cruel, MD   05/17/2017 11:06 PM Medical Oncology and Hematology [facility] [place], [place] [address] Tel. [phone]    Fax. [phone]

## 2017-05-17 ENCOUNTER — Encounter: Payer: Self-pay | Admitting: Radiation Oncology

## 2017-05-18 NOTE — Progress Notes (Signed)
Location of Breast Cancer: upper-outer quadrant of right breast   Histology per Pathology Report:   03/05/17 Diagnosis 1. Breast, simple mastectomy, Left Total - FIBROCYSTIC CHANGES WITH CALCIFICATIONS AND FOCAL SCLEROSING ADENOSIS. - NO EVIDENCE OF MALIGNANCY. 2. Breast, simple mastectomy, Right Total - INVASIVE AND IN SITU DUCTAL CARCINOMA, 5.3 CM. - MARGINS NOT INVOLVED. - PREVIOUS BIOPSY SITES. - FIBROCYSTIC CHANGES. - ONE BENIGN LYMPH NODE (0/1). - SEE ONCLOLGY TABLE AND COMMENT. 3. Lymph node, sentinel, biopsy, Right Axillary #1 - ONE BENIGN LYMPH NODE (0/1). 4. Lymph node, biopsy, Right Axillary - ONE BENIGN LYMPH NODE (0/1). 5. Lymph node, sentinel, biopsy, Right Axillary #2 - ONE BENIGN LYMPH NODE (0/1). 6. Lymph node, sentinel, biopsy, Right Axillary #3 - ONE BENIGN LYMPH NODE (0/1). 7. Lymph node, sentinel, biopsy, Right Axillary #4 - ONE BENIGN LYMPH NODE (0/1).  01/29/17 Diagnosis 1. Breast, right, needle core biopsy, mass 12:00 o'clock 4 cmfn - INVASIVE AND IN SITU MAMMARY CARCINOMA. - LYMPHOVASCULAR INVASION IS IDENTIFIED. - SEE COMMENT. 2. Breast, right, needle core biopsy, mass 12:00 o'clock retroareolar, palpable - INVASIVE AND IN SITU MAMMARY CARCINOMA. - SEE COMMENT.  Receptor Status: ER(100%), PR (60%), Her2-neu (negative)  Did patient present with symptoms (if so, please note symptoms) or was this found on screening mammography?: screening mammogram  Past/Anticipated interventions by surgeon, if any: 03/05/17 - Procedure: BILATERAL TOTAL MASTECTOMIES WITH RIGHT SENTINEL LYMPH NODE BIOPSIES;  Surgeon: Excell Seltzer, MD;  Location: [facility];  Service: General;  Laterality: Right;  Past/Anticipated interventions by medical oncology, if any: currently taking Arimidex  Lymphedema issues, if any:  no   Pain issues, if any:  no   SAFETY ISSUES:  Prior radiation? Yes - had radiation to her ears when she was a baby  Pacemaker/ICD? no  Possible current  pregnancy?no  Is the patient on methotrexate? no  Current Complaints / other details:  Patient reports having trouble with her balance since her surgery.  She is wondering if she can get a note for a handicap sticker.    BP (!) 155/67 (BP Location: Left Arm, Patient Position: Sitting)   Pulse 76   Temp 98.4 F (36.9 C) (Oral)   Ht _0  (1.549 m)   Wt 193 lb 12.8 oz (87.9 kg)   SpO2 98%   BMI 36.62 kg/m    Wt Readings from Last 3 Encounters:  05/23/17 193 lb 12.8 oz (87.9 kg)  05/15/17 189 lb (85.7 kg)  04/11/17 191 lb 6.4 oz (86.8 kg)

## 2017-05-23 ENCOUNTER — Ambulatory Visit
Admission: RE | Admit: 2017-05-23 | Discharge: 2017-05-23 | Disposition: A | Discharge status: Home / Self Care | Payer: Medicare Other | Source: Ambulatory Visit | Attending: Radiation Oncology | Admitting: Radiation Oncology

## 2017-05-23 VITALS — BP 155/67 | HR 76 | Temp 98.4°F | Ht 61.0 in | Wt 193.8 lb

## 2017-05-23 DIAGNOSIS — D0511 Intraductal carcinoma in situ of right breast: Secondary | ICD-10-CM | POA: Diagnosis not present

## 2017-05-23 DIAGNOSIS — Z510 Encounter for antineoplastic radiation therapy: Secondary | ICD-10-CM | POA: Diagnosis not present

## 2017-05-23 DIAGNOSIS — Z885 Allergy status to narcotic agent status: Secondary | ICD-10-CM | POA: Diagnosis not present

## 2017-05-23 DIAGNOSIS — Z79899 Other long term (current) drug therapy: Secondary | ICD-10-CM | POA: Diagnosis not present

## 2017-05-23 DIAGNOSIS — Z9013 Acquired absence of bilateral breasts and nipples: Secondary | ICD-10-CM | POA: Diagnosis not present

## 2017-05-23 DIAGNOSIS — C50411 Malignant neoplasm of upper-outer quadrant of right female breast: Secondary | ICD-10-CM

## 2017-05-23 DIAGNOSIS — Z170 Estrogen receptor positive status [ER+]: Principal | ICD-10-CM

## 2017-05-23 NOTE — Progress Notes (Signed)
Please see the Nurse Progress Note in the MD Initial Consult Encounter for this patient. 

## 2017-05-23 NOTE — Progress Notes (Signed)
Radiation Oncology         [phone] ________________________________  Name: Kimberly Waters MRN: [id number]  Date: 05/23/2017  DOB: [birth year]  Re-consultation Note  CC: Josetta Huddle, MD  Magrinat, Virgie Dad, MD    ICD-9-CM ICD-10-CM   1. Malignant neoplasm of upper-outer quadrant of right breast in female, estrogen receptor positive ([place]) 174.4 C50.411    V86.0 Z17.0    j Diagnosis:  Pathologic (pT3, pN0) Invasive Ductal Carcinoma of the Right Breast, ER/PR Positive, HER2 Negative, Grade 2, with LVI  Narrative:  The patient returns today for re-evaluation. The patient previously underwent a simple mastectomy of the left and right breast. This was performed on 03/05/17. Left breast mastectomy revealed fibrocystic changes with calcifications and no evidence of malignancy. Right breast mastectomy revealed invasive and in situ ductal carcinoma, 5.3 cm with no marginal involvement. Sentinel lymph node biopsy showed 6 benign lymph nodes. She was seen by Dr. Excell Seltzer on 04/05/17 to drain fluid. She is currently taking Arimidex, and follows with Dr. Jana Hakim.  On review of systems, the patient denies lymphedema or pain concerns. She reports some trouble with balance since her surgery. She questions if she could get a handicap parking sticker.  Of note, the patient reports she had radiation to her ears when she was a baby.               ALLERGIES:  is allergic to adhesive [tape]; codeine; and other.  Meds: Current Outpatient Prescriptions  Medication Sig Dispense Refill  . albuterol (PROVENTIL,VENTOLIN) 90 MCG/ACT inhaler Inhale 2 puffs into the lungs every 4 (four) hours as needed. (Patient taking differently: Inhale 2 puffs into the lungs every 4 (four) hours as needed for wheezing or shortness of breath. ) 17 g 3  . anastrozole (ARIMIDEX) 1 MG tablet Take 1 tablet (1 mg total) by mouth daily. 90 tablet 4  . diphenhydramine-acetaminophen (TYLENOL PM EXTRA STRENGTH) 25-500 MG TABS tablet  Take 2 tablets by mouth at bedtime.    Marland Kitchen imipramine (TOFRANIL) 50 MG tablet Take 4 tablets (200 mg total) by mouth at bedtime. 360 tablet 0  . levothyroxine (SYNTHROID, LEVOTHROID) 112 MCG tablet TAKE ONE TABLET BY MOUTH ONCE DAILY (Patient taking differently: Take 112 mcg by mouth daily before breakfast. ) 30 tablet 0  . metoprolol succinate (TOPROL-XL) 25 MG 24 hr tablet     . omeprazole (PRILOSEC) 20 MG capsule Take 20 mg by mouth daily.    . simvastatin (ZOCOR) 20 MG tablet Take 1 tablet (20 mg total) by mouth at bedtime. 90 tablet 0   No current facility-administered medications for this encounter.     Physical Findings: The patient is in no acute distress. Patient is alert and oriented.  height is _0  (1.549 m) and weight is 193 lb 12.8 oz (87.9 kg). Her oral temperature is 98.4 F (36.9 C). Her blood pressure is 155/67 (abnormal) and her pulse is 76. Her oxygen saturation is 98%.  No significant changes. Lungs are clear to auscultation bilaterally. Heart has regular rate and rhythm. No palpable cervical, supraclavicular, or axillary adenopathy. Abdomen soft, non tender, normal bowel sounds.   Left chest patient has a mastectomy scar that is well healed without signs of drainage or infection. Right breast there is a mastectomy scar, without signs of drainage or infection. Patient may have some possible fluid in the axillary area, though this is non tender.   Lab Findings: Lab Results  Component Value Date   WBC 6.4  05/15/2017   HGB 12.0 05/15/2017   HCT 37.6 05/15/2017   MCV 92.6 05/15/2017   PLT 273 05/15/2017    Radiographic Findings: No results found.  Impression: Pathologic (pT3, pN0) Invasive Ductal Carcinoma of the Right Breast, ER/PR Positive, HER2 Negative, Grade 2, with LVI  Patient returned from vacation 1 week early due to family illness, so we moved up simulation time to 05/28/17. I discussed the patient's situation with colleagues and most were in agreement for  post mastectomy irradiation given large tumor size and LVI.  NCCN guidelines also suggest consideration of post mastectomy radiation therapy in this situation.  Since the patient's tumor did show lymphovascular space invasion, will consider treating the high axilla/supraclavicular region with her post mastectomy irradiation treatments.  We briefly reviewed the available radiation techniques, and focused on the details of logistics and delivery. We reviewed the anticipated acute and late sequelae associated with radiation in this setting. The patient was encouraged to ask questions that I answered to the best of my ability. Patient would like to proceed with radiation therapy. Consent form was signed at prior visit.  Plan: Patient will proceed with CT simulation on 05/28/17 at 3 pm.   -----------------------------------  Blair Promise, PhD, MD  This document serves as a record of services personally performed by Gery Pray, MD. It was created on his behalf by Maryla Morrow, a trained medical scribe. The creation of this record is based on the scribe's personal observations and the provider's statements to them. This document has been checked and approved by the attending provider.

## 2017-05-28 ENCOUNTER — Ambulatory Visit
Admission: RE | Admit: 2017-05-28 | Discharge: 2017-05-28 | Disposition: A | Discharge status: Home / Self Care | Payer: Medicare Other | Source: Ambulatory Visit | Attending: Radiation Oncology | Admitting: Radiation Oncology

## 2017-05-28 DIAGNOSIS — Z9013 Acquired absence of bilateral breasts and nipples: Secondary | ICD-10-CM | POA: Diagnosis not present

## 2017-05-28 DIAGNOSIS — C50411 Malignant neoplasm of upper-outer quadrant of right female breast: Secondary | ICD-10-CM

## 2017-05-28 DIAGNOSIS — Z170 Estrogen receptor positive status [ER+]: Secondary | ICD-10-CM | POA: Diagnosis not present

## 2017-05-28 DIAGNOSIS — Z885 Allergy status to narcotic agent status: Secondary | ICD-10-CM | POA: Diagnosis not present

## 2017-05-28 DIAGNOSIS — Z79899 Other long term (current) drug therapy: Secondary | ICD-10-CM | POA: Diagnosis not present

## 2017-05-28 DIAGNOSIS — Z510 Encounter for antineoplastic radiation therapy: Secondary | ICD-10-CM | POA: Diagnosis not present

## 2017-05-28 NOTE — Progress Notes (Signed)
  Radiation Oncology         [phone]) [phone] ________________________________  Name: Kimberly Waters MRN: [id number]  Date: 05/28/2017  DOB: [birth year]  SIMULATION AND TREATMENT PLANNING NOTE    ICD-9-CM ICD-10-CM   1. Malignant neoplasm of upper-outer quadrant of right breast in female, estrogen receptor positive ([place]) 174.4 C50.411    V86.0 Z17.0     DIAGNOSIS: Stage IIB (pT3, pN0) Invasive Ductal Carcinoma of theRight Breast, ER/PR Positive, HER2 Negative,Grade 2, LVI  NARRATIVE:  The patient was brought to the [facility].  Identity was confirmed.  All relevant records and images related to the planned course of therapy were reviewed.  The patient freely provided informed written consent to proceed with treatment after reviewing the details related to the planned course of therapy. The consent form was witnessed and verified by the simulation staff.  Then, the patient was set-up in a stable reproducible  supine position for radiation therapy.  CT images were obtained.  Surface markings were placed.  The CT images were loaded into the planning software.  Then the target and avoidance structures were contoured.  Treatment planning then occurred.  The radiation prescription was entered and confirmed.  Then, I designed and supervised the construction of a total of 4 medically necessary complex treatment devices.  I have requested : 3D Simulation  I have requested a DVH of the following structures: heart, lungs, spinal cord.  I have ordered: dose calc.  PLAN:  The patient will receive 50.4 Gy in 28 fractions Directed at the right chest wall area. The high axilla/supraclavicular region will receive 45 gray in 25 fractions. The mastectomy scar area will then be boosted an additional 10 gray for a cumulative dose of 60.4 gray. Bolus will be used.  -----------------------------------  Blair Promise, PhD, MD  This document serves as a record of services personally performed by Gery Pray, MD. It was created on his behalf by Darcus Austin, a trained medical scribe. The creation of this record is based on the scribe's personal observations and the provider's statements to them. This document has been checked and approved by the attending provider.

## 2017-05-31 DIAGNOSIS — Z885 Allergy status to narcotic agent status: Secondary | ICD-10-CM | POA: Diagnosis not present

## 2017-05-31 DIAGNOSIS — C50411 Malignant neoplasm of upper-outer quadrant of right female breast: Secondary | ICD-10-CM | POA: Diagnosis not present

## 2017-05-31 DIAGNOSIS — Z170 Estrogen receptor positive status [ER+]: Secondary | ICD-10-CM | POA: Diagnosis not present

## 2017-05-31 DIAGNOSIS — Z79899 Other long term (current) drug therapy: Secondary | ICD-10-CM | POA: Diagnosis not present

## 2017-05-31 DIAGNOSIS — Z510 Encounter for antineoplastic radiation therapy: Secondary | ICD-10-CM | POA: Diagnosis not present

## 2017-05-31 DIAGNOSIS — Z9013 Acquired absence of bilateral breasts and nipples: Secondary | ICD-10-CM | POA: Diagnosis not present

## 2017-06-01 NOTE — Addendum Note (Signed)
Addendum  created 06/01/17 1050 by Zacchary Pompei, MD   Sign clinical note    

## 2017-06-04 ENCOUNTER — Ambulatory Visit
Admission: RE | Admit: 2017-06-04 | Discharge: 2017-06-04 | Disposition: A | Discharge status: Home / Self Care | Payer: Medicare Other | Source: Ambulatory Visit | Attending: Radiation Oncology | Admitting: Radiation Oncology

## 2017-06-04 ENCOUNTER — Ambulatory Visit: Payer: Medicare Other | Admitting: Radiation Oncology

## 2017-06-04 DIAGNOSIS — Z170 Estrogen receptor positive status [ER+]: Principal | ICD-10-CM

## 2017-06-04 DIAGNOSIS — Z510 Encounter for antineoplastic radiation therapy: Secondary | ICD-10-CM | POA: Diagnosis not present

## 2017-06-04 DIAGNOSIS — Z885 Allergy status to narcotic agent status: Secondary | ICD-10-CM | POA: Diagnosis not present

## 2017-06-04 DIAGNOSIS — C50411 Malignant neoplasm of upper-outer quadrant of right female breast: Secondary | ICD-10-CM | POA: Diagnosis not present

## 2017-06-04 DIAGNOSIS — Z9013 Acquired absence of bilateral breasts and nipples: Secondary | ICD-10-CM | POA: Diagnosis not present

## 2017-06-04 DIAGNOSIS — Z79899 Other long term (current) drug therapy: Secondary | ICD-10-CM | POA: Diagnosis not present

## 2017-06-04 NOTE — Progress Notes (Signed)
  Radiation Oncology         [phone] ________________________________  Name: Kimberly Waters MRN: [id number]  Date: 06/04/2017  DOB: Nov 23, 1946  Simulation Verification Note    ICD-10-CM   1. Malignant neoplasm of upper-outer quadrant of right breast in female, estrogen receptor positive ([place]) C50.411    Z17.0     Status: outpatient  NARRATIVE: The patient was brought to the treatment unit and placed in the planned treatment position. The clinical setup was verified. Then port films were obtained and uploaded to the radiation oncology medical record software.  The treatment beams were carefully compared against the planned radiation fields. The position location and shape of the radiation fields was reviewed. They targeted volume of tissue appears to be appropriately covered by the radiation beams. Organs at risk appear to be excluded as planned.  Based on my personal review, I approved the simulation verification. The patient's treatment will proceed as planned.  -----------------------------------  Blair Promise, PhD, MD

## 2017-06-05 ENCOUNTER — Ambulatory Visit
Admission: RE | Admit: 2017-06-05 | Discharge: 2017-06-05 | Disposition: A | Discharge status: Home / Self Care | Payer: Medicare Other | Source: Ambulatory Visit | Attending: Radiation Oncology | Admitting: Radiation Oncology

## 2017-06-05 ENCOUNTER — Ambulatory Visit: Payer: Medicare Other | Admitting: Radiation Oncology

## 2017-06-05 DIAGNOSIS — Z79899 Other long term (current) drug therapy: Secondary | ICD-10-CM | POA: Diagnosis not present

## 2017-06-05 DIAGNOSIS — Z170 Estrogen receptor positive status [ER+]: Secondary | ICD-10-CM | POA: Diagnosis not present

## 2017-06-05 DIAGNOSIS — C50411 Malignant neoplasm of upper-outer quadrant of right female breast: Secondary | ICD-10-CM | POA: Diagnosis not present

## 2017-06-05 DIAGNOSIS — Z9013 Acquired absence of bilateral breasts and nipples: Secondary | ICD-10-CM | POA: Diagnosis not present

## 2017-06-05 DIAGNOSIS — Z510 Encounter for antineoplastic radiation therapy: Secondary | ICD-10-CM | POA: Diagnosis not present

## 2017-06-05 DIAGNOSIS — Z885 Allergy status to narcotic agent status: Secondary | ICD-10-CM | POA: Diagnosis not present

## 2017-06-05 MED ORDER — ALRA NON-METALLIC DEODORANT (RAD-ONC)
1.0000 "application " | Freq: Once | TOPICAL | Status: AC
  Administered 2017-06-05: 1 via TOPICAL

## 2017-06-05 MED ORDER — RADIAPLEXRX EX GEL
Freq: Once | CUTANEOUS | Status: AC
  Administered 2017-06-05: 18:00:00 via TOPICAL

## 2017-06-05 NOTE — Progress Notes (Signed)
Pt here for patient teaching.  Pt given Radiation and You booklet, skin care instructions, Alra deodorant and Radiaplex gel. Reviewed areas of pertinence such as fatigue and skin changes . Pt able to give teach back of to pat skin and use unscented/gentle soap,apply Radiaplex bid and avoid applying anything to skin within 4 hours of treatment. Pt demonstrated understanding and verbalizes understanding of information given and will contact nursing with any questions or concerns.        

## 2017-06-06 ENCOUNTER — Ambulatory Visit
Admission: RE | Admit: 2017-06-06 | Discharge: 2017-06-06 | Disposition: A | Discharge status: Home / Self Care | Payer: Medicare Other | Source: Ambulatory Visit | Attending: Radiation Oncology | Admitting: Radiation Oncology

## 2017-06-06 DIAGNOSIS — Z170 Estrogen receptor positive status [ER+]: Secondary | ICD-10-CM | POA: Diagnosis not present

## 2017-06-06 DIAGNOSIS — Z510 Encounter for antineoplastic radiation therapy: Secondary | ICD-10-CM | POA: Diagnosis not present

## 2017-06-06 DIAGNOSIS — Z9013 Acquired absence of bilateral breasts and nipples: Secondary | ICD-10-CM | POA: Diagnosis not present

## 2017-06-06 DIAGNOSIS — Z79899 Other long term (current) drug therapy: Secondary | ICD-10-CM | POA: Diagnosis not present

## 2017-06-06 DIAGNOSIS — C50411 Malignant neoplasm of upper-outer quadrant of right female breast: Secondary | ICD-10-CM | POA: Diagnosis not present

## 2017-06-06 DIAGNOSIS — Z885 Allergy status to narcotic agent status: Secondary | ICD-10-CM | POA: Diagnosis not present

## 2017-06-07 ENCOUNTER — Ambulatory Visit
Admission: RE | Admit: 2017-06-07 | Discharge: 2017-06-07 | Disposition: A | Discharge status: Home / Self Care | Payer: Medicare Other | Source: Ambulatory Visit | Attending: Radiation Oncology | Admitting: Radiation Oncology

## 2017-06-07 DIAGNOSIS — Z510 Encounter for antineoplastic radiation therapy: Secondary | ICD-10-CM | POA: Diagnosis not present

## 2017-06-07 DIAGNOSIS — C50411 Malignant neoplasm of upper-outer quadrant of right female breast: Secondary | ICD-10-CM | POA: Diagnosis not present

## 2017-06-07 DIAGNOSIS — Z9013 Acquired absence of bilateral breasts and nipples: Secondary | ICD-10-CM | POA: Diagnosis not present

## 2017-06-07 DIAGNOSIS — Z79899 Other long term (current) drug therapy: Secondary | ICD-10-CM | POA: Diagnosis not present

## 2017-06-07 DIAGNOSIS — Z885 Allergy status to narcotic agent status: Secondary | ICD-10-CM | POA: Diagnosis not present

## 2017-06-07 DIAGNOSIS — Z170 Estrogen receptor positive status [ER+]: Secondary | ICD-10-CM | POA: Diagnosis not present

## 2017-06-08 ENCOUNTER — Ambulatory Visit
Admission: RE | Admit: 2017-06-08 | Discharge: 2017-06-08 | Disposition: A | Discharge status: Home / Self Care | Payer: Medicare Other | Source: Ambulatory Visit | Attending: Radiation Oncology | Admitting: Radiation Oncology

## 2017-06-08 DIAGNOSIS — Z79899 Other long term (current) drug therapy: Secondary | ICD-10-CM | POA: Diagnosis not present

## 2017-06-08 DIAGNOSIS — C50411 Malignant neoplasm of upper-outer quadrant of right female breast: Secondary | ICD-10-CM | POA: Diagnosis not present

## 2017-06-08 DIAGNOSIS — Z885 Allergy status to narcotic agent status: Secondary | ICD-10-CM | POA: Diagnosis not present

## 2017-06-08 DIAGNOSIS — Z510 Encounter for antineoplastic radiation therapy: Secondary | ICD-10-CM | POA: Diagnosis not present

## 2017-06-08 DIAGNOSIS — Z170 Estrogen receptor positive status [ER+]: Secondary | ICD-10-CM | POA: Diagnosis not present

## 2017-06-08 DIAGNOSIS — Z9013 Acquired absence of bilateral breasts and nipples: Secondary | ICD-10-CM | POA: Diagnosis not present

## 2017-06-11 ENCOUNTER — Ambulatory Visit
Admission: RE | Admit: 2017-06-11 | Discharge: 2017-06-11 | Disposition: A | Discharge status: Home / Self Care | Payer: Medicare Other | Source: Ambulatory Visit | Attending: Radiation Oncology | Admitting: Radiation Oncology

## 2017-06-11 DIAGNOSIS — C50411 Malignant neoplasm of upper-outer quadrant of right female breast: Secondary | ICD-10-CM | POA: Diagnosis not present

## 2017-06-11 DIAGNOSIS — Z9013 Acquired absence of bilateral breasts and nipples: Secondary | ICD-10-CM | POA: Diagnosis not present

## 2017-06-11 DIAGNOSIS — Z170 Estrogen receptor positive status [ER+]: Secondary | ICD-10-CM | POA: Diagnosis not present

## 2017-06-11 DIAGNOSIS — Z885 Allergy status to narcotic agent status: Secondary | ICD-10-CM | POA: Diagnosis not present

## 2017-06-11 DIAGNOSIS — Z510 Encounter for antineoplastic radiation therapy: Secondary | ICD-10-CM | POA: Diagnosis not present

## 2017-06-11 DIAGNOSIS — Z79899 Other long term (current) drug therapy: Secondary | ICD-10-CM | POA: Diagnosis not present

## 2017-06-12 ENCOUNTER — Ambulatory Visit
Admission: RE | Admit: 2017-06-12 | Discharge: 2017-06-12 | Disposition: A | Discharge status: Home / Self Care | Payer: Medicare Other | Source: Ambulatory Visit | Attending: Radiation Oncology | Admitting: Radiation Oncology

## 2017-06-12 DIAGNOSIS — Z510 Encounter for antineoplastic radiation therapy: Secondary | ICD-10-CM | POA: Diagnosis not present

## 2017-06-12 DIAGNOSIS — C50411 Malignant neoplasm of upper-outer quadrant of right female breast: Secondary | ICD-10-CM | POA: Diagnosis not present

## 2017-06-12 DIAGNOSIS — Z9013 Acquired absence of bilateral breasts and nipples: Secondary | ICD-10-CM | POA: Diagnosis not present

## 2017-06-12 DIAGNOSIS — Z170 Estrogen receptor positive status [ER+]: Secondary | ICD-10-CM | POA: Diagnosis not present

## 2017-06-12 DIAGNOSIS — Z885 Allergy status to narcotic agent status: Secondary | ICD-10-CM | POA: Diagnosis not present

## 2017-06-12 DIAGNOSIS — Z79899 Other long term (current) drug therapy: Secondary | ICD-10-CM | POA: Diagnosis not present

## 2017-06-13 ENCOUNTER — Ambulatory Visit
Admission: RE | Admit: 2017-06-13 | Discharge: 2017-06-13 | Disposition: A | Discharge status: Home / Self Care | Payer: Medicare Other | Source: Ambulatory Visit | Attending: Radiation Oncology | Admitting: Radiation Oncology

## 2017-06-13 DIAGNOSIS — Z885 Allergy status to narcotic agent status: Secondary | ICD-10-CM | POA: Diagnosis not present

## 2017-06-13 DIAGNOSIS — Z170 Estrogen receptor positive status [ER+]: Secondary | ICD-10-CM | POA: Diagnosis not present

## 2017-06-13 DIAGNOSIS — Z510 Encounter for antineoplastic radiation therapy: Secondary | ICD-10-CM | POA: Diagnosis not present

## 2017-06-13 DIAGNOSIS — C50411 Malignant neoplasm of upper-outer quadrant of right female breast: Secondary | ICD-10-CM | POA: Diagnosis not present

## 2017-06-13 DIAGNOSIS — Z79899 Other long term (current) drug therapy: Secondary | ICD-10-CM | POA: Diagnosis not present

## 2017-06-13 DIAGNOSIS — Z9013 Acquired absence of bilateral breasts and nipples: Secondary | ICD-10-CM | POA: Diagnosis not present

## 2017-06-14 ENCOUNTER — Ambulatory Visit
Admission: RE | Admit: 2017-06-14 | Discharge: 2017-06-14 | Disposition: A | Discharge status: Home / Self Care | Payer: Medicare Other | Source: Ambulatory Visit | Attending: Radiation Oncology | Admitting: Radiation Oncology

## 2017-06-14 DIAGNOSIS — Z79899 Other long term (current) drug therapy: Secondary | ICD-10-CM | POA: Diagnosis not present

## 2017-06-14 DIAGNOSIS — Z170 Estrogen receptor positive status [ER+]: Secondary | ICD-10-CM | POA: Diagnosis not present

## 2017-06-14 DIAGNOSIS — Z510 Encounter for antineoplastic radiation therapy: Secondary | ICD-10-CM | POA: Diagnosis not present

## 2017-06-14 DIAGNOSIS — C50411 Malignant neoplasm of upper-outer quadrant of right female breast: Secondary | ICD-10-CM | POA: Diagnosis not present

## 2017-06-14 DIAGNOSIS — Z9013 Acquired absence of bilateral breasts and nipples: Secondary | ICD-10-CM | POA: Diagnosis not present

## 2017-06-14 DIAGNOSIS — Z885 Allergy status to narcotic agent status: Secondary | ICD-10-CM | POA: Diagnosis not present

## 2017-06-15 ENCOUNTER — Ambulatory Visit
Admission: RE | Admit: 2017-06-15 | Discharge: 2017-06-15 | Disposition: A | Discharge status: Home / Self Care | Payer: Medicare Other | Source: Ambulatory Visit | Attending: Radiation Oncology | Admitting: Radiation Oncology

## 2017-06-15 DIAGNOSIS — Z510 Encounter for antineoplastic radiation therapy: Secondary | ICD-10-CM | POA: Diagnosis not present

## 2017-06-15 DIAGNOSIS — Z170 Estrogen receptor positive status [ER+]: Secondary | ICD-10-CM | POA: Diagnosis not present

## 2017-06-15 DIAGNOSIS — Z885 Allergy status to narcotic agent status: Secondary | ICD-10-CM | POA: Diagnosis not present

## 2017-06-15 DIAGNOSIS — C50411 Malignant neoplasm of upper-outer quadrant of right female breast: Secondary | ICD-10-CM | POA: Diagnosis not present

## 2017-06-15 DIAGNOSIS — Z9013 Acquired absence of bilateral breasts and nipples: Secondary | ICD-10-CM | POA: Diagnosis not present

## 2017-06-15 DIAGNOSIS — Z79899 Other long term (current) drug therapy: Secondary | ICD-10-CM | POA: Diagnosis not present

## 2017-06-18 ENCOUNTER — Ambulatory Visit
Admission: RE | Admit: 2017-06-18 | Discharge: 2017-06-18 | Disposition: A | Discharge status: Home / Self Care | Payer: Medicare Other | Source: Ambulatory Visit | Attending: Radiation Oncology | Admitting: Radiation Oncology

## 2017-06-18 DIAGNOSIS — Z9013 Acquired absence of bilateral breasts and nipples: Secondary | ICD-10-CM | POA: Diagnosis not present

## 2017-06-18 DIAGNOSIS — Z79899 Other long term (current) drug therapy: Secondary | ICD-10-CM | POA: Diagnosis not present

## 2017-06-18 DIAGNOSIS — Z510 Encounter for antineoplastic radiation therapy: Secondary | ICD-10-CM | POA: Diagnosis not present

## 2017-06-18 DIAGNOSIS — Z170 Estrogen receptor positive status [ER+]: Secondary | ICD-10-CM | POA: Diagnosis not present

## 2017-06-18 DIAGNOSIS — C50411 Malignant neoplasm of upper-outer quadrant of right female breast: Secondary | ICD-10-CM | POA: Diagnosis not present

## 2017-06-18 DIAGNOSIS — Z885 Allergy status to narcotic agent status: Secondary | ICD-10-CM | POA: Diagnosis not present

## 2017-06-19 ENCOUNTER — Ambulatory Visit
Admission: RE | Admit: 2017-06-19 | Discharge: 2017-06-19 | Disposition: A | Discharge status: Home / Self Care | Payer: Medicare Other | Source: Ambulatory Visit | Attending: Radiation Oncology | Admitting: Radiation Oncology

## 2017-06-19 DIAGNOSIS — Z510 Encounter for antineoplastic radiation therapy: Secondary | ICD-10-CM | POA: Diagnosis not present

## 2017-06-19 DIAGNOSIS — C50411 Malignant neoplasm of upper-outer quadrant of right female breast: Secondary | ICD-10-CM | POA: Diagnosis not present

## 2017-06-19 DIAGNOSIS — Z9013 Acquired absence of bilateral breasts and nipples: Secondary | ICD-10-CM | POA: Diagnosis not present

## 2017-06-19 DIAGNOSIS — Z79899 Other long term (current) drug therapy: Secondary | ICD-10-CM | POA: Diagnosis not present

## 2017-06-19 DIAGNOSIS — Z170 Estrogen receptor positive status [ER+]: Secondary | ICD-10-CM | POA: Diagnosis not present

## 2017-06-19 DIAGNOSIS — Z885 Allergy status to narcotic agent status: Secondary | ICD-10-CM | POA: Diagnosis not present

## 2017-06-20 ENCOUNTER — Ambulatory Visit
Admission: RE | Admit: 2017-06-20 | Discharge: 2017-06-20 | Disposition: A | Discharge status: Home / Self Care | Payer: Medicare Other | Source: Ambulatory Visit | Attending: Radiation Oncology | Admitting: Radiation Oncology

## 2017-06-20 DIAGNOSIS — Z510 Encounter for antineoplastic radiation therapy: Secondary | ICD-10-CM | POA: Diagnosis not present

## 2017-06-20 DIAGNOSIS — C50411 Malignant neoplasm of upper-outer quadrant of right female breast: Secondary | ICD-10-CM | POA: Diagnosis not present

## 2017-06-20 DIAGNOSIS — Z170 Estrogen receptor positive status [ER+]: Secondary | ICD-10-CM | POA: Diagnosis not present

## 2017-06-20 DIAGNOSIS — Z9013 Acquired absence of bilateral breasts and nipples: Secondary | ICD-10-CM | POA: Diagnosis not present

## 2017-06-20 DIAGNOSIS — Z885 Allergy status to narcotic agent status: Secondary | ICD-10-CM | POA: Diagnosis not present

## 2017-06-20 DIAGNOSIS — Z79899 Other long term (current) drug therapy: Secondary | ICD-10-CM | POA: Diagnosis not present

## 2017-06-21 ENCOUNTER — Ambulatory Visit
Admission: RE | Admit: 2017-06-21 | Discharge: 2017-06-21 | Disposition: A | Discharge status: Home / Self Care | Payer: Medicare Other | Source: Ambulatory Visit | Attending: Radiation Oncology | Admitting: Radiation Oncology

## 2017-06-21 DIAGNOSIS — Z510 Encounter for antineoplastic radiation therapy: Secondary | ICD-10-CM | POA: Diagnosis not present

## 2017-06-21 DIAGNOSIS — C50411 Malignant neoplasm of upper-outer quadrant of right female breast: Secondary | ICD-10-CM | POA: Diagnosis not present

## 2017-06-21 DIAGNOSIS — Z885 Allergy status to narcotic agent status: Secondary | ICD-10-CM | POA: Diagnosis not present

## 2017-06-21 DIAGNOSIS — Z9013 Acquired absence of bilateral breasts and nipples: Secondary | ICD-10-CM | POA: Diagnosis not present

## 2017-06-21 DIAGNOSIS — Z79899 Other long term (current) drug therapy: Secondary | ICD-10-CM | POA: Diagnosis not present

## 2017-06-21 DIAGNOSIS — Z170 Estrogen receptor positive status [ER+]: Secondary | ICD-10-CM | POA: Diagnosis not present

## 2017-06-22 ENCOUNTER — Ambulatory Visit
Admission: RE | Admit: 2017-06-22 | Discharge: 2017-06-22 | Disposition: A | Discharge status: Home / Self Care | Payer: Medicare Other | Source: Ambulatory Visit | Attending: Radiation Oncology | Admitting: Radiation Oncology

## 2017-06-22 DIAGNOSIS — Z9013 Acquired absence of bilateral breasts and nipples: Secondary | ICD-10-CM | POA: Diagnosis not present

## 2017-06-22 DIAGNOSIS — C50411 Malignant neoplasm of upper-outer quadrant of right female breast: Secondary | ICD-10-CM | POA: Diagnosis not present

## 2017-06-22 DIAGNOSIS — Z510 Encounter for antineoplastic radiation therapy: Secondary | ICD-10-CM | POA: Diagnosis not present

## 2017-06-22 DIAGNOSIS — Z79899 Other long term (current) drug therapy: Secondary | ICD-10-CM | POA: Diagnosis not present

## 2017-06-22 DIAGNOSIS — Z170 Estrogen receptor positive status [ER+]: Secondary | ICD-10-CM | POA: Diagnosis not present

## 2017-06-22 DIAGNOSIS — Z885 Allergy status to narcotic agent status: Secondary | ICD-10-CM | POA: Diagnosis not present

## 2017-06-25 ENCOUNTER — Ambulatory Visit
Admission: RE | Admit: 2017-06-25 | Discharge: 2017-06-25 | Disposition: A | Discharge status: Home / Self Care | Payer: Medicare Other | Source: Ambulatory Visit | Attending: Radiation Oncology | Admitting: Radiation Oncology

## 2017-06-25 DIAGNOSIS — Z510 Encounter for antineoplastic radiation therapy: Secondary | ICD-10-CM | POA: Diagnosis not present

## 2017-06-25 DIAGNOSIS — Z170 Estrogen receptor positive status [ER+]: Secondary | ICD-10-CM | POA: Diagnosis not present

## 2017-06-25 DIAGNOSIS — Z79899 Other long term (current) drug therapy: Secondary | ICD-10-CM | POA: Diagnosis not present

## 2017-06-25 DIAGNOSIS — C50411 Malignant neoplasm of upper-outer quadrant of right female breast: Secondary | ICD-10-CM | POA: Diagnosis not present

## 2017-06-25 DIAGNOSIS — Z885 Allergy status to narcotic agent status: Secondary | ICD-10-CM | POA: Diagnosis not present

## 2017-06-25 DIAGNOSIS — Z9013 Acquired absence of bilateral breasts and nipples: Secondary | ICD-10-CM | POA: Diagnosis not present

## 2017-06-26 ENCOUNTER — Ambulatory Visit
Admission: RE | Admit: 2017-06-26 | Discharge: 2017-06-26 | Disposition: A | Discharge status: Home / Self Care | Payer: Medicare Other | Source: Ambulatory Visit | Attending: Radiation Oncology | Admitting: Radiation Oncology

## 2017-06-26 DIAGNOSIS — Z170 Estrogen receptor positive status [ER+]: Principal | ICD-10-CM

## 2017-06-26 DIAGNOSIS — Z79899 Other long term (current) drug therapy: Secondary | ICD-10-CM | POA: Diagnosis not present

## 2017-06-26 DIAGNOSIS — Z510 Encounter for antineoplastic radiation therapy: Secondary | ICD-10-CM | POA: Diagnosis not present

## 2017-06-26 DIAGNOSIS — Z9013 Acquired absence of bilateral breasts and nipples: Secondary | ICD-10-CM | POA: Diagnosis not present

## 2017-06-26 DIAGNOSIS — C50411 Malignant neoplasm of upper-outer quadrant of right female breast: Secondary | ICD-10-CM

## 2017-06-26 DIAGNOSIS — Z885 Allergy status to narcotic agent status: Secondary | ICD-10-CM | POA: Diagnosis not present

## 2017-06-26 HISTORY — DX: Malignant neoplasm of unspecified site of unspecified female breast: C50.919

## 2017-06-26 MED ORDER — RADIAPLEXRX EX GEL
Freq: Once | CUTANEOUS | Status: AC
Start: 2017-06-26 — End: 2017-06-26
  Administered 2017-06-26: 09:00:00 via TOPICAL

## 2017-06-28 ENCOUNTER — Ambulatory Visit
Admission: RE | Admit: 2017-06-28 | Discharge: 2017-06-28 | Disposition: A | Discharge status: Home / Self Care | Payer: Medicare Other | Source: Ambulatory Visit | Attending: Radiation Oncology | Admitting: Radiation Oncology

## 2017-06-28 DIAGNOSIS — Z510 Encounter for antineoplastic radiation therapy: Secondary | ICD-10-CM | POA: Diagnosis not present

## 2017-06-28 DIAGNOSIS — Z170 Estrogen receptor positive status [ER+]: Secondary | ICD-10-CM | POA: Diagnosis not present

## 2017-06-28 DIAGNOSIS — C50411 Malignant neoplasm of upper-outer quadrant of right female breast: Secondary | ICD-10-CM | POA: Diagnosis not present

## 2017-06-28 DIAGNOSIS — Z79899 Other long term (current) drug therapy: Secondary | ICD-10-CM | POA: Diagnosis not present

## 2017-06-28 DIAGNOSIS — Z885 Allergy status to narcotic agent status: Secondary | ICD-10-CM | POA: Diagnosis not present

## 2017-06-28 DIAGNOSIS — Z9013 Acquired absence of bilateral breasts and nipples: Secondary | ICD-10-CM | POA: Diagnosis not present

## 2017-06-29 ENCOUNTER — Ambulatory Visit
Admission: RE | Admit: 2017-06-29 | Discharge: 2017-06-29 | Disposition: A | Discharge status: Home / Self Care | Payer: Medicare Other | Source: Ambulatory Visit | Attending: Radiation Oncology | Admitting: Radiation Oncology

## 2017-06-29 DIAGNOSIS — Z170 Estrogen receptor positive status [ER+]: Secondary | ICD-10-CM | POA: Diagnosis not present

## 2017-06-29 DIAGNOSIS — Z885 Allergy status to narcotic agent status: Secondary | ICD-10-CM | POA: Diagnosis not present

## 2017-06-29 DIAGNOSIS — Z79899 Other long term (current) drug therapy: Secondary | ICD-10-CM | POA: Diagnosis not present

## 2017-06-29 DIAGNOSIS — Z510 Encounter for antineoplastic radiation therapy: Secondary | ICD-10-CM | POA: Diagnosis not present

## 2017-06-29 DIAGNOSIS — Z9013 Acquired absence of bilateral breasts and nipples: Secondary | ICD-10-CM | POA: Diagnosis not present

## 2017-06-29 DIAGNOSIS — C50411 Malignant neoplasm of upper-outer quadrant of right female breast: Secondary | ICD-10-CM | POA: Diagnosis not present

## 2017-07-02 ENCOUNTER — Ambulatory Visit
Admission: RE | Admit: 2017-07-02 | Discharge: 2017-07-02 | Disposition: A | Discharge status: Home / Self Care | Payer: Medicare Other | Source: Ambulatory Visit | Attending: Radiation Oncology | Admitting: Radiation Oncology

## 2017-07-02 ENCOUNTER — Ambulatory Visit (HOSPITAL_BASED_OUTPATIENT_CLINIC_OR_DEPARTMENT_OTHER): Payer: Medicare Other | Admitting: Oncology

## 2017-07-02 ENCOUNTER — Other Ambulatory Visit (HOSPITAL_BASED_OUTPATIENT_CLINIC_OR_DEPARTMENT_OTHER): Payer: Medicare Other

## 2017-07-02 VITALS — BP 107/61 | HR 90 | Temp 97.8°F | Resp 18 | Ht 61.0 in | Wt 194.0 lb

## 2017-07-02 DIAGNOSIS — Z79899 Other long term (current) drug therapy: Secondary | ICD-10-CM | POA: Diagnosis not present

## 2017-07-02 DIAGNOSIS — Z170 Estrogen receptor positive status [ER+]: Principal | ICD-10-CM

## 2017-07-02 DIAGNOSIS — C50411 Malignant neoplasm of upper-outer quadrant of right female breast: Secondary | ICD-10-CM | POA: Diagnosis not present

## 2017-07-02 DIAGNOSIS — Z9013 Acquired absence of bilateral breasts and nipples: Secondary | ICD-10-CM | POA: Diagnosis not present

## 2017-07-02 DIAGNOSIS — Z510 Encounter for antineoplastic radiation therapy: Secondary | ICD-10-CM | POA: Diagnosis not present

## 2017-07-02 DIAGNOSIS — Z885 Allergy status to narcotic agent status: Secondary | ICD-10-CM | POA: Diagnosis not present

## 2017-07-02 LAB — CBC WITH DIFFERENTIAL/PLATELET
BASO%: 0.3 % (ref 0.0–2.0)
BASOS ABS: 0 10*3/uL (ref 0.0–0.1)
EOS%: 0 % (ref 0.0–7.0)
Eosinophils Absolute: 0 10*3/uL (ref 0.0–0.5)
HCT: 37.9 % (ref 34.8–46.6)
HGB: 12.5 g/dL (ref 11.6–15.9)
LYMPH%: 18.1 % (ref 14.0–49.7)
MCH: 30.1 pg (ref 25.1–34.0)
MCHC: 33 g/dL (ref 31.5–36.0)
MCV: 91.3 fL (ref 79.5–101.0)
MONO#: 0.5 10*3/uL (ref 0.1–0.9)
MONO%: 7.1 % (ref 0.0–14.0)
NEUT#: 5.2 10*3/uL (ref 1.5–6.5)
NEUT%: 74.5 % (ref 38.4–76.8)
Platelets: 238 10*3/uL (ref 145–400)
RBC: 4.15 10*6/uL (ref 3.70–5.45)
RDW: 13.4 % (ref 11.2–14.5)
WBC: 7 10*3/uL (ref 3.9–10.3)
lymph#: 1.3 10*3/uL (ref 0.9–3.3)

## 2017-07-02 LAB — COMPREHENSIVE METABOLIC PANEL
ALK PHOS: 59 U/L (ref 40–150)
ALT: 24 U/L (ref 0–55)
AST: 15 U/L (ref 5–34)
Albumin: 3.7 g/dL (ref 3.5–5.0)
Anion Gap: 8 mEq/L (ref 3–11)
BUN: 28.6 mg/dL — ABNORMAL HIGH (ref 7.0–26.0)
CO2: 27 meq/L (ref 22–29)
Calcium: 9.6 mg/dL (ref 8.4–10.4)
Chloride: 99 mEq/L (ref 98–109)
Creatinine: 1.6 mg/dL — ABNORMAL HIGH (ref 0.6–1.1)
EGFR: 32 mL/min/{1.73_m2} — AB (ref >90)
Glucose: 102 mg/dl (ref 70–140)
POTASSIUM: 4.3 meq/L (ref 3.5–5.1)
SODIUM: 134 meq/L — AB (ref 136–145)
Total Bilirubin: 0.37 mg/dL (ref 0.20–1.20)
Total Protein: 7.8 g/dL (ref 6.4–8.3)

## 2017-07-02 NOTE — Progress Notes (Signed)
[place]  Telephone:(336) [phone] Fax:(336) [phone]     ID: Kimberly Waters DOB: [birth year]  MR#: [id number]  VZC#:[id number]  Patient Care Team: Josetta Huddle, MD as PCP - General (Internal Medicine) Juanita Craver, MD as Consulting Physician (Gastroenterology) Rhena Glace, Virgie Dad, MD as Consulting Physician (Oncology) Excell Seltzer, MD as Consulting Physician (General Surgery) Gery Pray, MD as Consulting Physician (Radiation Oncology) Chauncey Cruel, MD OTHER MD:  CHIEF COMPLAINT: Estrogen receptor positive breast cancer  CURRENT TREATMENT: Anastrozole, adjuvant radiation ongoing   BREAST CANCER HISTORY: From the original intake note:  Kimberly Waters had an unremarkable mammogram 07/01/2012, after which she did not obtain further mammography. Sometime late 2017 she noted the right nipple was inverted. She brought this to medical attention and on 01/25/2017 she underwent bilateral mammography with tomography and right breast ultrasonography at [facility]. The breast density was category C. In the central right breast there was an area of architectural distortion associated with nipple retraction. Ultrasound confirmed a 2.2 cm irregular mass in the right breast central to the nipple. In addition there was a 2.5 cm irregular mass in the right breast superiorly. The right axilla was sonographically benign.  On 01/29/2017 she underwent biopsy of both these masses, and both showed morphologically similar invasive ductal carcinomas, grade 1 or 2, with evidence of lymphovascular invasion. Both tumors were 100% estrogen receptor positive with strong staining intensity, and 60-100% progesterone receptor positive, with strong staining intensity. The MIP-1 varied from 5-10%. Both tumors were HER-2 negative, with a signals ratio 1.12-1.39, and the number per cell 2.30-3.97.  The patient's subsequent history is as detailed below   INTERVAL HISTORY: Kimberly Waters returns today for follow-up and  treatment of her estrogen receptor positive breast cancer accompanied by her daughter. Kimberly Waters is currently undergoing radiation. She is doing "okay" with that. She will be done towards the end of this month.  She has been on anastrozole now approximately 2 months. Hot flashes are "not too bad". Vaginal dryness is "okay". The only problem is that she is being $60 for this drug at [facility]. She should be able to find it for less than that I believe and we discussed that at length today.  REVIEW OF SYSTEMS: She is not exercising regularly. Aside from that she does not have any specific complaints today and a detailed review of systems was noncontributory    PAST MEDICAL HISTORY: Past Medical History:  Diagnosis Date  . Allergy   . Breast cancer ([place])   . Cancer ([facility])    BREAST  . Colon polyps   . Depression   . Dyspnea    SEASONAL  . Family history of breast cancer   . Family history of colon cancer   . GERD (gastroesophageal reflux disease)   . Hyperlipidemia   . Hypertension   . Hypothyroidism   . PONV (postoperative nausea and vomiting)   . Thyroid disease     PAST SURGICAL HISTORY: Past Surgical History:  Procedure Laterality Date  . ABDOMINAL HYSTERECTOMY    . KNEE SURGERY    . MASTECTOMY W/ SENTINEL NODE BIOPSY Right 03/05/2017   Procedure: BILATERAL TOTAL MASTECTOMIES WITH RIGHT SENTINEL LYMPH NODE BIOPSIES;  Surgeon: Excell Seltzer, MD;  Location: [place];  Service: General;  Laterality: Right;  . SHOULDER SURGERY     BILAT    FAMILY HISTORY Family History  Problem Relation Age of Onset  . Colon cancer Maternal Grandfather 19  . Colon cancer Maternal Aunt 25  . Colon cancer Maternal  Uncle        dx in his 69s  . Breast cancer Paternal Aunt 32  . Breast cancer Cousin 20       paternal first cousin  . Lung cancer Paternal Grandfather   . Colon cancer Maternal Uncle   . Head & neck cancer Maternal Uncle        oral cancer   The patient's father died at age 44. Her  mother is 40 years old as of February 2018. The patient had no brothers, 1 sister. On the mother's side a grandfather, and an uncle all had colon cancer's. On the father's side there is an aunt and a cousin with breast cancer, both diagnosed in their late 84s. There is no history of ovarian cancer in the family.  GYNECOLOGIC HISTORY:  No LMP recorded. Patient has had a hysterectomy.  menarche age 84, first live birth age 89, the patient is [place] P2. She underwent total abdominal hysterectomy with bilateral salpingo-oophorectomy remotely and took hormone replacement for approximately 6 years.   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 Her husband Kimberly Waters ("[address] Garciaperez is retired from [employer]. Their son Kimberly Waters is an Chief Financial Officer living in [place]. Their daughter Kimberly Waters works at [employer] in [place] the patient has 5 grandchildren. She is a Tourist information centre manager     ADVANCED DIRECTIVES:  not in place    HEALTH MAINTENANCE: Social History  Substance Use Topics  . Smoking status: Former Smoker    Packs/day: 1.00    Years: 45.00    Types: Cigarettes    Quit date: 02/22/2017  . Smokeless tobacco: Never Used  . Alcohol use No     Colonoscopy: 03/18/2014/[facility]  PAP: Status post hysterectomy  Bone density: Never    Allergies  Allergen Reactions  . Adhesive [Tape] Other (See Comments)    (skin tears)  Tolerates PAPER TAPE  . Codeine Nausea Only  . Other Nausea Only    Anesthesia--nausea     Current Outpatient Prescriptions  Medication Sig Dispense Refill  . albuterol (PROVENTIL,VENTOLIN) 90 MCG/ACT inhaler Inhale 2 puffs into the lungs every 4 (four) hours as needed. (Patient taking differently: Inhale 2 puffs into the lungs every 4 (four) hours as needed for wheezing or shortness of breath. ) 17 g 3  . anastrozole (ARIMIDEX) 1 MG tablet Take 1 tablet (1 mg total) by mouth daily. 90 tablet 4  .  diphenhydramine-acetaminophen (TYLENOL PM EXTRA STRENGTH) 25-500 MG TABS tablet Take 2 tablets by mouth at bedtime.    . hyaluronate sodium (RADIAPLEXRX) GEL Apply 1 application topically 2 (two) times daily. 2nd tube given 06/26/17    . imipramine (TOFRANIL) 50 MG tablet Take 4 tablets (200 mg total) by mouth at bedtime. 360 tablet 0  . levothyroxine (SYNTHROID, LEVOTHROID) 112 MCG tablet TAKE ONE TABLET BY MOUTH ONCE DAILY (Patient taking differently: Take 112 mcg by mouth daily before breakfast. ) 30 tablet 0  . metoprolol succinate (TOPROL-XL) 25 MG 24 hr tablet     . omeprazole (PRILOSEC) 20 MG capsule Take 20 mg by mouth daily.    . simvastatin (ZOCOR) 20 MG tablet Take 1 tablet (20 mg total) by mouth at bedtime. 90 tablet 0   No current facility-administered medications for this visit.   A  OBJECTIVE: Middle-aged white woman  Who appears stated age 71:   07/02/17 1129  BP: 107/61  Pulse: 90  Resp: 18  Temp: 97.8 F (36.6 C)     Body mass index is 36.66 kg/m.    ECOG FS:1 - Symptomatic but completely ambulatory  Sclerae unicteric, pupils round and equal Oropharynx clear and moist No cervical or supraclavicular adenopathy Lungs no rales or rhonchi Heart regular rate and rhythm Abd soft, nontender, positive bowel sounds MSK no focal spinal tenderness, no upper extremity lymphedema Neuro: nonfocal, well oriented, appropriate affect Breasts: She has undergone bilateral mastectomies. There is no dehiscence, erythema, and only minimal inflammation over the radiation port area. Both axillae are benign.   LAB RESULTS:  CMP     Component Value Date/Time   NA 138 05/15/2017 1147   K 4.0 05/15/2017 1147   CL 105 03/06/2017 0804   CO2 26 05/15/2017 1147   GLUCOSE 92 05/15/2017 1147   BUN 15.6 05/15/2017 1147   CREATININE 1.2 (H) 05/15/2017 1147   CALCIUM 9.4 05/15/2017 1147   PROT 7.3 05/15/2017 1147   ALBUMIN 3.6 05/15/2017 1147   AST 14 05/15/2017 1147   ALT 18  05/15/2017 1147   ALKPHOS 63 05/15/2017 1147   BILITOT 0.36 05/15/2017 1147   GFRNONAA 38 (L) 03/06/2017 0804   GFRAA 44 (L) 03/06/2017 0804    INo results found for: SPEP, UPEP  Lab Results  Component Value Date   WBC 7.0 07/02/2017   NEUTROABS 5.2 07/02/2017   HGB 12.5 07/02/2017   HCT 37.9 07/02/2017   MCV 91.3 07/02/2017   PLT 238 07/02/2017      Chemistry      Component Value Date/Time   NA 138 05/15/2017 1147   K 4.0 05/15/2017 1147   CL 105 03/06/2017 0804   CO2 26 05/15/2017 1147   BUN 15.6 05/15/2017 1147   CREATININE 1.2 (H) 05/15/2017 1147      Component Value Date/Time   CALCIUM 9.4 05/15/2017 1147   ALKPHOS 63 05/15/2017 1147   AST 14 05/15/2017 1147   ALT 18 05/15/2017 1147   BILITOT 0.36 05/15/2017 1147       No results found for: LABCA2  No components found for: LABCA125  No results for input(s): INR in the last 168 hours.  Urinalysis No results found for: COLORURINE, APPEARANCEUR, LABSPEC, PHURINE, GLUCOSEU, HGBUR, BILIRUBINUR, KETONESUR, PROTEINUR, UROBILINOGEN, NITRITE, LEUKOCYTESUR   STUDIES: No results found.  ELIGIBLE FOR AVAILABLE RESEARCH PROTOCOL: no  ASSESSMENT: 71 y.[place], [place]  Woman status post biopsy of 2 separate masses in the central right breast 01/29/2017, showing a clinical mT2 N0 invasive ductal carcinoma, stage IB in the new classification: Both masses being estrogen receptor and progesterone receptor positive, HER-2 not amplified, with an MIB-1 between 5 and 10%.  (1) status post bilateral mastectomies 03/05/2017 showing  (a) on the left, no malignancy  (b) On the right, a pT3 pN0, stage IIA invasive ductal carcinoma, grade 2,  with negative margins    (c) patient is not interested in reconstruction  (2) Oncotype score of 6 predicts a 10 year risk of recurrence outside the breast of 5% if the patient's only systemic therapy is tamoxifen for 5 years. It also predicts no benefit from chemotherapy.  (3)  adjuvant radiation To be completed 07/20/2017.  (4) started anastrozole May 2018  (a) bone density at [facility] 04/24/2017 showed a T score of -0.3  (5) genetics testing through the Hereditary Gene Panel offered by [facility] i found no deleterious mutations in APC, ATM, AXIN2, BARD1, BMPR1A, BRCA1, BRCA2, BRIP1, CDH1, CDKN2A (p14ARF), CDKN2A (p16INK4a), CHEK2, DICER1, EPCAM (Deletion/duplication  testing only), GREM1 (promoter region deletion/duplication testing only), KIT, MEN1, MLH1, MSH2, MSH6, MUTYH, NBN, NF1, PALB2, PDGFRA, PMS2, POLD1, POLE, PTEN, RAD50, RAD51C, RAD51D, SDHB, SDHC, SDHD, SMAD4, SMARCA4. STK11, TP53, TSC1, TSC2, and VHL.  The following gene was evaluated for sequence changes only: SDHA and HOXB13 c.251G>A variant only  (a) 2 VUS were identified:  MSH6 c.3394G>C and TSC1 c.1481C>T  (6) tobacco abuse: Patient quit smoking 03/03/2017   PLAN: Camyah has recovered nicely from her surgery and is tolerating radiation well. She will finish those treatments at the end of this month.  She is now about 2 months into anastrozole with no significant side effects. That is very favorable and the plan will be to continue anastrozole for a total of 5 years.  She is paying quite a bit for this drug and I am giving her a written prescription today so she can "shop it" and hopefully get it at a more reasonable price.  Her bone density at [facility] showed a T score of -0.3 (normal). We will repeat that in 2 years.  Otherwise, assuming no specific problems she will see me again in October and we will broaden her follow-up interval from that point  She knows to call for any other issues that may develop before then.  :Chauncey Cruel, MD   07/02/2017 11:35 AM Medical Oncology and Hematology [facility] [place], [place] [address] Tel. [phone]    Fax. [phone]

## 2017-07-03 ENCOUNTER — Ambulatory Visit
Admission: RE | Admit: 2017-07-03 | Discharge: 2017-07-03 | Disposition: A | Discharge status: Home / Self Care | Payer: Medicare Other | Source: Ambulatory Visit | Attending: Radiation Oncology | Admitting: Radiation Oncology

## 2017-07-03 DIAGNOSIS — Z885 Allergy status to narcotic agent status: Secondary | ICD-10-CM | POA: Diagnosis not present

## 2017-07-03 DIAGNOSIS — C50411 Malignant neoplasm of upper-outer quadrant of right female breast: Secondary | ICD-10-CM | POA: Diagnosis not present

## 2017-07-03 DIAGNOSIS — Z9013 Acquired absence of bilateral breasts and nipples: Secondary | ICD-10-CM | POA: Diagnosis not present

## 2017-07-03 DIAGNOSIS — Z79899 Other long term (current) drug therapy: Secondary | ICD-10-CM | POA: Diagnosis not present

## 2017-07-03 DIAGNOSIS — Z170 Estrogen receptor positive status [ER+]: Secondary | ICD-10-CM | POA: Diagnosis not present

## 2017-07-03 DIAGNOSIS — Z510 Encounter for antineoplastic radiation therapy: Secondary | ICD-10-CM | POA: Diagnosis not present

## 2017-07-04 ENCOUNTER — Ambulatory Visit
Admission: RE | Admit: 2017-07-04 | Discharge: 2017-07-04 | Disposition: A | Discharge status: Home / Self Care | Payer: Medicare Other | Source: Ambulatory Visit | Attending: Radiation Oncology | Admitting: Radiation Oncology

## 2017-07-04 DIAGNOSIS — Z9013 Acquired absence of bilateral breasts and nipples: Secondary | ICD-10-CM | POA: Diagnosis not present

## 2017-07-04 DIAGNOSIS — Z510 Encounter for antineoplastic radiation therapy: Secondary | ICD-10-CM | POA: Diagnosis not present

## 2017-07-04 DIAGNOSIS — Z79899 Other long term (current) drug therapy: Secondary | ICD-10-CM | POA: Diagnosis not present

## 2017-07-04 DIAGNOSIS — Z885 Allergy status to narcotic agent status: Secondary | ICD-10-CM | POA: Diagnosis not present

## 2017-07-04 DIAGNOSIS — C50411 Malignant neoplasm of upper-outer quadrant of right female breast: Secondary | ICD-10-CM | POA: Diagnosis not present

## 2017-07-04 DIAGNOSIS — Z170 Estrogen receptor positive status [ER+]: Secondary | ICD-10-CM | POA: Diagnosis not present

## 2017-07-05 ENCOUNTER — Ambulatory Visit
Admission: RE | Admit: 2017-07-05 | Discharge: 2017-07-05 | Disposition: A | Discharge status: Home / Self Care | Payer: Medicare Other | Source: Ambulatory Visit | Attending: Radiation Oncology | Admitting: Radiation Oncology

## 2017-07-05 DIAGNOSIS — Z79899 Other long term (current) drug therapy: Secondary | ICD-10-CM | POA: Diagnosis not present

## 2017-07-05 DIAGNOSIS — Z885 Allergy status to narcotic agent status: Secondary | ICD-10-CM | POA: Diagnosis not present

## 2017-07-05 DIAGNOSIS — Z9013 Acquired absence of bilateral breasts and nipples: Secondary | ICD-10-CM | POA: Diagnosis not present

## 2017-07-05 DIAGNOSIS — Z510 Encounter for antineoplastic radiation therapy: Secondary | ICD-10-CM | POA: Diagnosis not present

## 2017-07-05 DIAGNOSIS — C50411 Malignant neoplasm of upper-outer quadrant of right female breast: Secondary | ICD-10-CM | POA: Diagnosis not present

## 2017-07-05 DIAGNOSIS — Z170 Estrogen receptor positive status [ER+]: Secondary | ICD-10-CM | POA: Diagnosis not present

## 2017-07-06 ENCOUNTER — Ambulatory Visit
Admission: RE | Admit: 2017-07-06 | Discharge: 2017-07-06 | Disposition: A | Discharge status: Home / Self Care | Payer: Medicare Other | Source: Ambulatory Visit | Attending: Radiation Oncology | Admitting: Radiation Oncology

## 2017-07-06 DIAGNOSIS — Z885 Allergy status to narcotic agent status: Secondary | ICD-10-CM | POA: Diagnosis not present

## 2017-07-06 DIAGNOSIS — C50411 Malignant neoplasm of upper-outer quadrant of right female breast: Secondary | ICD-10-CM | POA: Diagnosis not present

## 2017-07-06 DIAGNOSIS — Z79899 Other long term (current) drug therapy: Secondary | ICD-10-CM | POA: Diagnosis not present

## 2017-07-06 DIAGNOSIS — Z9013 Acquired absence of bilateral breasts and nipples: Secondary | ICD-10-CM | POA: Diagnosis not present

## 2017-07-06 DIAGNOSIS — Z170 Estrogen receptor positive status [ER+]: Secondary | ICD-10-CM | POA: Diagnosis not present

## 2017-07-06 DIAGNOSIS — Z510 Encounter for antineoplastic radiation therapy: Secondary | ICD-10-CM | POA: Diagnosis not present

## 2017-07-09 ENCOUNTER — Ambulatory Visit
Admission: RE | Admit: 2017-07-09 | Discharge: 2017-07-09 | Disposition: A | Discharge status: Home / Self Care | Payer: Medicare Other | Source: Ambulatory Visit | Attending: Radiation Oncology | Admitting: Radiation Oncology

## 2017-07-09 ENCOUNTER — Ambulatory Visit: Admission: RE | Admit: 2017-07-09 | Payer: Medicare Other | Source: Ambulatory Visit | Admitting: Radiation Oncology

## 2017-07-09 DIAGNOSIS — Z79899 Other long term (current) drug therapy: Secondary | ICD-10-CM | POA: Diagnosis not present

## 2017-07-09 DIAGNOSIS — Z170 Estrogen receptor positive status [ER+]: Secondary | ICD-10-CM | POA: Diagnosis not present

## 2017-07-09 DIAGNOSIS — Z885 Allergy status to narcotic agent status: Secondary | ICD-10-CM | POA: Diagnosis not present

## 2017-07-09 DIAGNOSIS — Z510 Encounter for antineoplastic radiation therapy: Secondary | ICD-10-CM | POA: Diagnosis not present

## 2017-07-09 DIAGNOSIS — Z9013 Acquired absence of bilateral breasts and nipples: Secondary | ICD-10-CM | POA: Diagnosis not present

## 2017-07-09 DIAGNOSIS — C50411 Malignant neoplasm of upper-outer quadrant of right female breast: Secondary | ICD-10-CM | POA: Diagnosis not present

## 2017-07-10 ENCOUNTER — Ambulatory Visit
Admission: RE | Admit: 2017-07-10 | Discharge: 2017-07-10 | Disposition: A | Discharge status: Home / Self Care | Payer: Medicare Other | Source: Ambulatory Visit | Attending: Radiation Oncology | Admitting: Radiation Oncology

## 2017-07-10 DIAGNOSIS — Z79811 Long term (current) use of aromatase inhibitors: Secondary | ICD-10-CM | POA: Diagnosis not present

## 2017-07-10 DIAGNOSIS — F329 Major depressive disorder, single episode, unspecified: Secondary | ICD-10-CM | POA: Insufficient documentation

## 2017-07-10 DIAGNOSIS — R531 Weakness: Secondary | ICD-10-CM | POA: Insufficient documentation

## 2017-07-10 DIAGNOSIS — Z510 Encounter for antineoplastic radiation therapy: Secondary | ICD-10-CM | POA: Insufficient documentation

## 2017-07-10 DIAGNOSIS — R5383 Other fatigue: Secondary | ICD-10-CM | POA: Diagnosis not present

## 2017-07-10 DIAGNOSIS — Z885 Allergy status to narcotic agent status: Secondary | ICD-10-CM | POA: Insufficient documentation

## 2017-07-10 DIAGNOSIS — Z170 Estrogen receptor positive status [ER+]: Secondary | ICD-10-CM | POA: Insufficient documentation

## 2017-07-10 DIAGNOSIS — Z9013 Acquired absence of bilateral breasts and nipples: Secondary | ICD-10-CM | POA: Diagnosis not present

## 2017-07-10 DIAGNOSIS — R42 Dizziness and giddiness: Secondary | ICD-10-CM | POA: Diagnosis not present

## 2017-07-10 DIAGNOSIS — C50411 Malignant neoplasm of upper-outer quadrant of right female breast: Secondary | ICD-10-CM | POA: Diagnosis not present

## 2017-07-10 DIAGNOSIS — Z79899 Other long term (current) drug therapy: Secondary | ICD-10-CM | POA: Diagnosis not present

## 2017-07-10 MED ORDER — SONAFINE EX EMUL
1.0000 | Freq: Once | CUTANEOUS | Status: AC
Start: 2017-07-10 — End: 2017-07-10
  Administered 2017-07-10: 1 via TOPICAL

## 2017-07-11 ENCOUNTER — Ambulatory Visit
Admission: RE | Admit: 2017-07-11 | Discharge: 2017-07-11 | Disposition: A | Discharge status: Home / Self Care | Payer: Medicare Other | Source: Ambulatory Visit | Attending: Radiation Oncology | Admitting: Radiation Oncology

## 2017-07-11 DIAGNOSIS — Z170 Estrogen receptor positive status [ER+]: Secondary | ICD-10-CM | POA: Diagnosis not present

## 2017-07-11 DIAGNOSIS — F329 Major depressive disorder, single episode, unspecified: Secondary | ICD-10-CM | POA: Diagnosis not present

## 2017-07-11 DIAGNOSIS — Z79811 Long term (current) use of aromatase inhibitors: Secondary | ICD-10-CM | POA: Diagnosis not present

## 2017-07-11 DIAGNOSIS — Z510 Encounter for antineoplastic radiation therapy: Secondary | ICD-10-CM | POA: Diagnosis not present

## 2017-07-11 DIAGNOSIS — Z79899 Other long term (current) drug therapy: Secondary | ICD-10-CM | POA: Diagnosis not present

## 2017-07-11 DIAGNOSIS — C50411 Malignant neoplasm of upper-outer quadrant of right female breast: Secondary | ICD-10-CM | POA: Diagnosis not present

## 2017-07-12 ENCOUNTER — Ambulatory Visit: Payer: Medicare Other | Admitting: Radiation Oncology

## 2017-07-12 ENCOUNTER — Ambulatory Visit
Admission: RE | Admit: 2017-07-12 | Discharge: 2017-07-12 | Disposition: A | Discharge status: Home / Self Care | Payer: Medicare Other | Source: Ambulatory Visit | Attending: Radiation Oncology | Admitting: Radiation Oncology

## 2017-07-12 DIAGNOSIS — Z170 Estrogen receptor positive status [ER+]: Secondary | ICD-10-CM | POA: Diagnosis not present

## 2017-07-12 DIAGNOSIS — Z79899 Other long term (current) drug therapy: Secondary | ICD-10-CM | POA: Diagnosis not present

## 2017-07-12 DIAGNOSIS — C50411 Malignant neoplasm of upper-outer quadrant of right female breast: Secondary | ICD-10-CM | POA: Diagnosis not present

## 2017-07-12 DIAGNOSIS — F329 Major depressive disorder, single episode, unspecified: Secondary | ICD-10-CM | POA: Diagnosis not present

## 2017-07-12 DIAGNOSIS — Z510 Encounter for antineoplastic radiation therapy: Secondary | ICD-10-CM | POA: Diagnosis not present

## 2017-07-12 DIAGNOSIS — Z79811 Long term (current) use of aromatase inhibitors: Secondary | ICD-10-CM | POA: Diagnosis not present

## 2017-07-13 ENCOUNTER — Ambulatory Visit
Admission: RE | Admit: 2017-07-13 | Discharge: 2017-07-13 | Disposition: A | Discharge status: Home / Self Care | Payer: Medicare Other | Source: Ambulatory Visit | Attending: Radiation Oncology | Admitting: Radiation Oncology

## 2017-07-13 DIAGNOSIS — F329 Major depressive disorder, single episode, unspecified: Secondary | ICD-10-CM | POA: Diagnosis not present

## 2017-07-13 DIAGNOSIS — Z79899 Other long term (current) drug therapy: Secondary | ICD-10-CM | POA: Diagnosis not present

## 2017-07-13 DIAGNOSIS — Z79811 Long term (current) use of aromatase inhibitors: Secondary | ICD-10-CM | POA: Diagnosis not present

## 2017-07-13 DIAGNOSIS — C50411 Malignant neoplasm of upper-outer quadrant of right female breast: Secondary | ICD-10-CM | POA: Diagnosis not present

## 2017-07-13 DIAGNOSIS — Z510 Encounter for antineoplastic radiation therapy: Secondary | ICD-10-CM | POA: Diagnosis not present

## 2017-07-13 DIAGNOSIS — Z170 Estrogen receptor positive status [ER+]: Secondary | ICD-10-CM | POA: Diagnosis not present

## 2017-07-16 ENCOUNTER — Ambulatory Visit
Admission: RE | Admit: 2017-07-16 | Discharge: 2017-07-16 | Disposition: A | Discharge status: Home / Self Care | Payer: Medicare Other | Source: Ambulatory Visit | Attending: Radiation Oncology | Admitting: Radiation Oncology

## 2017-07-16 DIAGNOSIS — C50411 Malignant neoplasm of upper-outer quadrant of right female breast: Secondary | ICD-10-CM

## 2017-07-16 DIAGNOSIS — Z510 Encounter for antineoplastic radiation therapy: Secondary | ICD-10-CM | POA: Diagnosis not present

## 2017-07-16 DIAGNOSIS — Z170 Estrogen receptor positive status [ER+]: Principal | ICD-10-CM

## 2017-07-16 DIAGNOSIS — Z79811 Long term (current) use of aromatase inhibitors: Secondary | ICD-10-CM | POA: Diagnosis not present

## 2017-07-16 DIAGNOSIS — F329 Major depressive disorder, single episode, unspecified: Secondary | ICD-10-CM | POA: Diagnosis not present

## 2017-07-16 DIAGNOSIS — Z79899 Other long term (current) drug therapy: Secondary | ICD-10-CM | POA: Diagnosis not present

## 2017-07-17 ENCOUNTER — Ambulatory Visit
Admission: RE | Admit: 2017-07-17 | Discharge: 2017-07-17 | Disposition: A | Discharge status: Home / Self Care | Payer: Medicare Other | Source: Ambulatory Visit | Attending: Radiation Oncology | Admitting: Radiation Oncology

## 2017-07-17 ENCOUNTER — Ambulatory Visit: Payer: Medicare Other

## 2017-07-17 DIAGNOSIS — Z79899 Other long term (current) drug therapy: Secondary | ICD-10-CM | POA: Diagnosis not present

## 2017-07-17 DIAGNOSIS — Z170 Estrogen receptor positive status [ER+]: Secondary | ICD-10-CM | POA: Diagnosis not present

## 2017-07-17 DIAGNOSIS — Z510 Encounter for antineoplastic radiation therapy: Secondary | ICD-10-CM | POA: Diagnosis not present

## 2017-07-17 DIAGNOSIS — Z79811 Long term (current) use of aromatase inhibitors: Secondary | ICD-10-CM | POA: Diagnosis not present

## 2017-07-17 DIAGNOSIS — C50411 Malignant neoplasm of upper-outer quadrant of right female breast: Secondary | ICD-10-CM | POA: Diagnosis not present

## 2017-07-17 DIAGNOSIS — F329 Major depressive disorder, single episode, unspecified: Secondary | ICD-10-CM | POA: Diagnosis not present

## 2017-07-17 MED ORDER — RADIAPLEXRX EX GEL
Freq: Once | CUTANEOUS | Status: AC
  Administered 2017-07-17: 10:00:00 via TOPICAL

## 2017-07-18 ENCOUNTER — Ambulatory Visit
Admission: RE | Admit: 2017-07-18 | Discharge: 2017-07-18 | Disposition: A | Discharge status: Home / Self Care | Payer: Medicare Other | Source: Ambulatory Visit | Attending: Radiation Oncology | Admitting: Radiation Oncology

## 2017-07-18 DIAGNOSIS — Z510 Encounter for antineoplastic radiation therapy: Secondary | ICD-10-CM | POA: Diagnosis not present

## 2017-07-18 DIAGNOSIS — Z170 Estrogen receptor positive status [ER+]: Secondary | ICD-10-CM | POA: Diagnosis not present

## 2017-07-18 DIAGNOSIS — F329 Major depressive disorder, single episode, unspecified: Secondary | ICD-10-CM | POA: Diagnosis not present

## 2017-07-18 DIAGNOSIS — Z79899 Other long term (current) drug therapy: Secondary | ICD-10-CM | POA: Diagnosis not present

## 2017-07-18 DIAGNOSIS — C50411 Malignant neoplasm of upper-outer quadrant of right female breast: Secondary | ICD-10-CM | POA: Diagnosis not present

## 2017-07-18 DIAGNOSIS — Z79811 Long term (current) use of aromatase inhibitors: Secondary | ICD-10-CM | POA: Diagnosis not present

## 2017-07-19 ENCOUNTER — Ambulatory Visit
Admission: RE | Admit: 2017-07-19 | Discharge: 2017-07-19 | Disposition: A | Discharge status: Home / Self Care | Payer: Medicare Other | Source: Ambulatory Visit | Attending: Radiation Oncology | Admitting: Radiation Oncology

## 2017-07-19 DIAGNOSIS — Z170 Estrogen receptor positive status [ER+]: Principal | ICD-10-CM

## 2017-07-19 DIAGNOSIS — Z510 Encounter for antineoplastic radiation therapy: Secondary | ICD-10-CM | POA: Diagnosis not present

## 2017-07-19 DIAGNOSIS — C50411 Malignant neoplasm of upper-outer quadrant of right female breast: Secondary | ICD-10-CM

## 2017-07-19 DIAGNOSIS — F329 Major depressive disorder, single episode, unspecified: Secondary | ICD-10-CM | POA: Diagnosis not present

## 2017-07-19 DIAGNOSIS — Z79811 Long term (current) use of aromatase inhibitors: Secondary | ICD-10-CM | POA: Diagnosis not present

## 2017-07-19 DIAGNOSIS — Z79899 Other long term (current) drug therapy: Secondary | ICD-10-CM | POA: Diagnosis not present

## 2017-07-19 MED ORDER — SILVER SULFADIAZINE 1 % EX CREA
TOPICAL_CREAM | Freq: Once | CUTANEOUS | Status: AC
Start: 2017-07-19 — End: 2017-07-19
  Administered 2017-07-19: 10:00:00 via TOPICAL

## 2017-07-19 MED ORDER — RADIAPLEXRX EX GEL
Freq: Once | CUTANEOUS | Status: AC
  Administered 2017-07-19: 10:00:00 via TOPICAL

## 2017-07-20 ENCOUNTER — Ambulatory Visit
Admission: RE | Admit: 2017-07-20 | Discharge: 2017-07-20 | Disposition: A | Discharge status: Home / Self Care | Payer: Medicare Other | Source: Ambulatory Visit | Attending: Radiation Oncology | Admitting: Radiation Oncology

## 2017-07-20 ENCOUNTER — Encounter: Payer: Self-pay | Admitting: Radiation Oncology

## 2017-07-20 DIAGNOSIS — Z79899 Other long term (current) drug therapy: Secondary | ICD-10-CM | POA: Diagnosis not present

## 2017-07-20 DIAGNOSIS — Z510 Encounter for antineoplastic radiation therapy: Secondary | ICD-10-CM | POA: Diagnosis not present

## 2017-07-20 DIAGNOSIS — Z170 Estrogen receptor positive status [ER+]: Secondary | ICD-10-CM | POA: Diagnosis not present

## 2017-07-20 DIAGNOSIS — F329 Major depressive disorder, single episode, unspecified: Secondary | ICD-10-CM | POA: Diagnosis not present

## 2017-07-20 DIAGNOSIS — C50411 Malignant neoplasm of upper-outer quadrant of right female breast: Secondary | ICD-10-CM | POA: Diagnosis not present

## 2017-07-20 DIAGNOSIS — Z79811 Long term (current) use of aromatase inhibitors: Secondary | ICD-10-CM | POA: Diagnosis not present

## 2017-07-23 NOTE — Progress Notes (Signed)
  Radiation Oncology         (336) [phone] ________________________________  Name: Kimberly Waters MRN: [id number]  Date: 07/20/2017  DOB: 04-17-1946  End of Treatment Note  Diagnosis:   71 y.o. woman with : Stage IIB (pT3, pN0) Invasive Ductal Carcinoma of theRight Breast, ER/PR Positive, HER2 Negative,Grade 2, LVI.       Indication for treatment:  Curative, post mastectomy       Radiation treatment dates:  06/05/17 - 07/20/17  Site/dose:    Right Chest Wall/ 50.4 Gy in 28 fractions  Right axillary Nodal region// 45 Gy in 25 fractions  Mastectomy scar boost 10 gray in 5 fractions   Beams/energy:   Photon// 6X  3D   Narrative: The patient tolerated radiation treatment relatively well. Patient reported having some shooting pain in her right chest. She also reported having no energy and having fatigue. The skin on her right subclavian, upper inner chest and the edge of her incision is peeling, with hyperpigmentation. Patient also reported tenderness in the top portion of her right chest wall. She was given Sonafine, Radiaplex and used both medications as well as Neosporin for the peeling and Hydrocortisone.   Plan: The patient has completed radiation treatment. The patient will return to radiation oncology clinic for routine followup in one month. I advised them to call or return sooner if they have any questions or concerns related to their recovery or treatment.  -----------------------------------  Blair Promise, PhD, MD  This document serves as a record of services personally performed by Gery Pray MD. It was created on his behalf by Delton Coombes, a trained medical scribe. The creation of this record is based on the scribe's personal observations and the provider's statements to them. This document has been checked and approved by the attending provider.

## 2017-07-31 ENCOUNTER — Encounter (HOSPITAL_COMMUNITY): Payer: Self-pay

## 2017-08-17 ENCOUNTER — Encounter: Payer: Self-pay | Admitting: Oncology

## 2017-08-20 ENCOUNTER — Ambulatory Visit
Admission: RE | Admit: 2017-08-20 | Discharge: 2017-08-20 | Disposition: A | Discharge status: Home / Self Care | Payer: Medicare Other | Source: Ambulatory Visit | Attending: Radiation Oncology | Admitting: Radiation Oncology

## 2017-08-20 ENCOUNTER — Encounter: Payer: Self-pay | Admitting: Radiation Oncology

## 2017-08-20 DIAGNOSIS — C50411 Malignant neoplasm of upper-outer quadrant of right female breast: Secondary | ICD-10-CM | POA: Diagnosis not present

## 2017-08-20 DIAGNOSIS — Z79899 Other long term (current) drug therapy: Secondary | ICD-10-CM | POA: Diagnosis not present

## 2017-08-20 DIAGNOSIS — Z170 Estrogen receptor positive status [ER+]: Secondary | ICD-10-CM | POA: Diagnosis not present

## 2017-08-20 DIAGNOSIS — Z79811 Long term (current) use of aromatase inhibitors: Secondary | ICD-10-CM | POA: Diagnosis not present

## 2017-08-20 DIAGNOSIS — F329 Major depressive disorder, single episode, unspecified: Secondary | ICD-10-CM | POA: Diagnosis not present

## 2017-08-20 DIAGNOSIS — Z510 Encounter for antineoplastic radiation therapy: Secondary | ICD-10-CM | POA: Diagnosis not present

## 2017-08-20 NOTE — Progress Notes (Signed)
Kimberly Waters is here for follow up.  She denies having pain.  She reports feeling dizzy, weak and fatigued for the past month which seems to have gotten worse.  She is taking Arimidex.  The skin on her right chest is pink with hyperpigmentation.  She also has some healed peeling areas.  BP 116/67 (BP Location: Left Arm, Patient Position: Sitting)   Pulse 71   Temp 97.7 F (36.5 C) (Oral)   Ht 5\' 1"  (1.549 m)   Wt 198 lb (89.8 kg)   SpO2 99%   BMI 37.41 kg/m    Wt Readings from Last 3 Encounters:  08/20/17 198 lb (89.8 kg)  07/02/17 194 lb (88 kg)  05/23/17 193 lb 12.8 oz (87.9 kg)

## 2017-08-20 NOTE — Progress Notes (Signed)
Radiation Oncology         (336) [phone] ________________________________  Name: Kimberly Waters MRN: [id number]  Date: 08/20/2017  DOB: 05/10/1946    Follow-Up Visit Note  CC: Josetta Huddle, MD  Magrinat, Virgie Dad, MD    ICD-10-CM   1. Malignant neoplasm of upper-outer quadrant of right breast in female, estrogen receptor positive ([place]) C50.411    Z17.0     Diagnosis:   71 y.o. woman with : Stage IIB(pT3, pN0) Invasive Ductal Carcinoma of theRight Breast, ER/PR Positive, HER2 Negative,Grade 2, LVI.  Interval Since Last Radiation:  1 months  Radiation treatment dates:  06/05/17 - 07/20/17  Right Chest Wall/ 50.4 Gy in 28 fractions Right axillary Nodal region// 45 Gy in 25 fractions Mastectomy scar boost 10 gray in 5 fractions  Narrative:  The patient returns today for routine follow-up.  She denies having pain. She reports feeling dizzy, weak and fatigued for the past month which seems to have gotten worse. States she feels she wants to withdraw and isn't sure if it is depression. She does not feel like herself. She went to the beach this past weekend and stayed hydrated. She is taking Arimidex. She began this medication after completion of radiation treatment. She reports hot flashes. She is sleeping well. Nursing notes, the skin on her right chest is pink with hyperpigmentation. She also has some healed peeling areas. She last had blood work drawn on 07/02/2017. She is scheduled to follow up with oncology, Wilber Bihari NP on 09/11/2017 and Dr. Jana Hakim on 10/16/2017.  She has not noticed any right arm swelling.                             ALLERGIES:  is allergic to adhesive [tape]; codeine; and other.  Meds: Current Outpatient Prescriptions  Medication Sig Dispense Refill  . albuterol (PROVENTIL,VENTOLIN) 90 MCG/ACT inhaler Inhale 2 puffs into the lungs every 4 (four) hours as needed. (Patient taking differently: Inhale 2 puffs into the lungs every 4 (four) hours as needed for  wheezing or shortness of breath. ) 17 g 3  . anastrozole (ARIMIDEX) 1 MG tablet Take 1 tablet (1 mg total) by mouth daily. 90 tablet 4  . diphenhydramine-acetaminophen (TYLENOL PM EXTRA STRENGTH) 25-500 MG TABS tablet Take 2 tablets by mouth at bedtime.    . hyaluronate sodium (RADIAPLEXRX) GEL Apply 1 application topically 2 (two) times daily. 2nd tube given 06/26/17    . imipramine (TOFRANIL) 50 MG tablet Take 4 tablets (200 mg total) by mouth at bedtime. 360 tablet 0  . levothyroxine (SYNTHROID, LEVOTHROID) 112 MCG tablet TAKE ONE TABLET BY MOUTH ONCE DAILY (Patient taking differently: Take 112 mcg by mouth daily before breakfast. ) 30 tablet 0  . lisinopril-hydrochlorothiazide (PRINZIDE,ZESTORETIC) 20-25 MG tablet 1 TABLET ONCE A DAY ORALLY 90 DAYS  0  . metoprolol succinate (TOPROL-XL) 25 MG 24 hr tablet     . omeprazole (PRILOSEC) 20 MG capsule Take 20 mg by mouth daily.    . simvastatin (ZOCOR) 20 MG tablet Take 1 tablet (20 mg total) by mouth at bedtime. 90 tablet 0   No current facility-administered medications for this encounter.     Physical Findings: The patient is in no acute distress. Patient is alert and oriented.  height is '5\' 1"'  (1.549 m) and weight is 198 lb (89.8 kg). Her oral temperature is 97.7 F (36.5 C). Her blood pressure is 116/67 and her pulse is  71. Her oxygen saturation is 99%. .   Lungs are clear to auscultation bilaterally. Heart has regular rate and rhythm. No palpable cervical, supraclavicular, or axillary adenopathy. Abdomen soft, non-tender, normal bowel sounds. Right chest exam shows right chest wall well healed with hyperpigmentation changes and mild edema. No signs of recurrence. Left chest /mastectomy scar is well healed and without signs of recurrence.  No right arm swelling noted.  Lab Findings: Lab Results  Component Value Date   WBC 7.0 07/02/2017   HGB 12.5 07/02/2017   HCT 37.9 07/02/2017   MCV 91.3 07/02/2017   PLT 238 07/02/2017     Radiographic Findings: No results found.  Impression:  The patient is recovering from the effects of radiation. No signs of recurrence on clinical exam. Mild depression and down feeling possibly related to Arimidex. She will speak with medical oncology about these symptoms.   Plan:  I encouraged her to continue follow-up with medical oncology. I will see her back on an as-needed basis. I have encouraged her to call if she has any issues or concerns in the future.  -----------------------------------  Blair Promise, PhD, MD  This document serves as a record of services personally performed by Gery Pray, MD. It was created on his behalf by Arlyce Harman, a trained medical scribe. The creation of this record is based on the scribe's personal observations and the provider's statements to them. This document has been checked and approved by the attending provider.

## 2017-08-23 DIAGNOSIS — J439 Emphysema, unspecified: Secondary | ICD-10-CM | POA: Diagnosis not present

## 2017-08-23 DIAGNOSIS — E785 Hyperlipidemia, unspecified: Secondary | ICD-10-CM | POA: Diagnosis not present

## 2017-08-23 DIAGNOSIS — H9190 Unspecified hearing loss, unspecified ear: Secondary | ICD-10-CM | POA: Diagnosis not present

## 2017-08-23 DIAGNOSIS — F17201 Nicotine dependence, unspecified, in remission: Secondary | ICD-10-CM | POA: Diagnosis not present

## 2017-08-23 DIAGNOSIS — I700 Atherosclerosis of aorta: Secondary | ICD-10-CM | POA: Diagnosis not present

## 2017-08-23 DIAGNOSIS — Z0000 Encounter for general adult medical examination without abnormal findings: Secondary | ICD-10-CM | POA: Diagnosis not present

## 2017-08-23 DIAGNOSIS — I10 Essential (primary) hypertension: Secondary | ICD-10-CM | POA: Diagnosis not present

## 2017-08-23 DIAGNOSIS — F322 Major depressive disorder, single episode, severe without psychotic features: Secondary | ICD-10-CM | POA: Diagnosis not present

## 2017-08-23 DIAGNOSIS — Z79899 Other long term (current) drug therapy: Secondary | ICD-10-CM | POA: Diagnosis not present

## 2017-08-23 DIAGNOSIS — Z6834 Body mass index (BMI) 34.0-34.9, adult: Secondary | ICD-10-CM | POA: Diagnosis not present

## 2017-08-23 DIAGNOSIS — E559 Vitamin D deficiency, unspecified: Secondary | ICD-10-CM | POA: Diagnosis not present

## 2017-08-23 DIAGNOSIS — R5383 Other fatigue: Secondary | ICD-10-CM | POA: Diagnosis not present

## 2017-09-11 ENCOUNTER — Telehealth: Payer: Self-pay | Admitting: Adult Health

## 2017-09-11 ENCOUNTER — Encounter: Payer: Self-pay | Admitting: Adult Health

## 2017-09-11 ENCOUNTER — Ambulatory Visit (HOSPITAL_BASED_OUTPATIENT_CLINIC_OR_DEPARTMENT_OTHER): Payer: Medicare Other | Admitting: Adult Health

## 2017-09-11 VITALS — BP 125/66 | HR 104 | Temp 98.1°F | Resp 17 | Ht 61.0 in | Wt 197.1 lb

## 2017-09-11 DIAGNOSIS — C50411 Malignant neoplasm of upper-outer quadrant of right female breast: Secondary | ICD-10-CM | POA: Diagnosis not present

## 2017-09-11 DIAGNOSIS — Z170 Estrogen receptor positive status [ER+]: Secondary | ICD-10-CM

## 2017-09-11 DIAGNOSIS — N951 Menopausal and female climacteric states: Secondary | ICD-10-CM

## 2017-09-11 NOTE — Telephone Encounter (Signed)
No 9/18 los -  

## 2017-09-11 NOTE — Progress Notes (Signed)
CLINIC:  Survivorship   REASON FOR VISIT:  Routine follow-up post-treatment for a recent history of breast cancer.  BRIEF ONCOLOGIC HISTORY:    Malignant neoplasm of upper-outer quadrant of right breast in female, estrogen receptor positive ([place])   02/06/2017 Initial Diagnosis    Malignant neoplasm of upper-outer quadrant of right breast in female, estrogen receptor positive ([place])     03/04/2017 Genetic Testing    MSH6 c.3394G>C and TSC1 c.1481C>T VUS identified on the common hereditary cancer panel.  The Hereditary Gene Panel offered by [facility] includes sequencing and/or deletion duplication testing of the following 43 genes: APC, ATM, AXIN2, BARD1, BMPR1A, BRCA1, BRCA2, BRIP1, CDH1, CDKN2A (p14ARF), CDKN2A (p16INK4a), CHEK2, DICER1, EPCAM (Deletion/duplication testing only), GREM1 (promoter region deletion/duplication testing only), KIT, MEN1, MLH1, MSH2, MSH6, MUTYH, NBN, NF1, PALB2, PDGFRA, PMS2, POLD1, POLE, PTEN, RAD50, RAD51C, RAD51D, SDHB, SDHC, SDHD, SMAD4, SMARCA4. STK11, TP53, TSC1, TSC2, and VHL.  The following gene was evaluated for sequence changes only: SDHA and HOXB13 c.251G>A variant only.  The report date is March 04, 2017.       03/05/2017 Surgery    Left mastectomy: Benign Right Mastectomy: IDC, 5.3 cm, g 2, margins negative, 6 LN negative, ER+100%/100%, PR+ 100%/60%, Ki-67 5%/10%, HER-2 negative ratio 1.39/1.12. T3, N0      03/05/2017 Oncotype testing    6/5%, low risk      04/2017 -  Anti-estrogen oral therapy    anastrozole daily             (a) bone density at [facility] 04/24/2017 showed a T score of -0.3      06/05/2017 - 07/20/2017 Radiation Therapy    Adjuvant Radiation ([place]): Right Chest Wall/ 50.4 Gy in 28 fractions.  Right axillary Nodal region// 45 Gy in 25 fractions.  Mastectomy scar boost 10 gray in 5 fractions       INTERVAL HISTORY:  Kimberly Waters presents to the [facility] today for our initial meeting to review her survivorship care plan  detailing her treatment course for breast cancer, as well as monitoring long-term side effects of that treatment, education regarding health maintenance, screening, and overall wellness and health promotion.     Overall, Kimberly Waters reports feeling quite well.  She is taking Anastrozole and is tolerating it very well.  She has hot flashes, vaginal dryness, but seems to be managing these well.  She does get occasional phantom pains where her breasts previously were at her surgical site, but these are infrequent and not particularly bothersome.      REVIEW OF SYSTEMS:  Review of Systems  Constitutional: Negative for appetite change, chills, fatigue, fever and unexpected weight change.  HENT:   Negative for hearing loss and lump/mass.   Eyes: Negative for eye problems and icterus.  Respiratory: Negative for chest tightness, cough, shortness of breath and wheezing.   Cardiovascular: Negative for chest pain, leg swelling and palpitations.  Gastrointestinal: Negative for abdominal distention, abdominal pain, constipation, diarrhea, nausea and vomiting.  Endocrine: Positive for hot flashes.  Genitourinary: Negative for difficulty urinating and dyspareunia.   Musculoskeletal: Negative for arthralgias.  Skin: Negative for itching and rash.  Neurological: Negative for dizziness, headaches, numbness and speech difficulty.  Hematological: Negative for adenopathy. Does not bruise/bleed easily.  Psychiatric/Behavioral: The patient is not nervous/anxious.         ONCOLOGY TREATMENT TEAM:  1. Surgeon:  Dr. Excell Seltzer at [facility] 2. Medical Oncologist: Dr. Jana Hakim  3. Radiation Oncologist: Dr. Sondra Come  PAST MEDICAL/SURGICAL HISTORY:  Past Medical History:  Diagnosis Date  . Allergy   . Breast cancer ([place])   . Cancer ([facility])    BREAST  . Colon polyps   . Depression   . Dyspnea    SEASONAL  . Family history of breast cancer   . Family history of colon cancer   . GERD  (gastroesophageal reflux disease)   . History of radiation therapy 06/05/17-07/20/17   right chest wall 50.4 Gy in 28 fractions, right axillary nodal region 45 Gy in 25 fractions  . Hyperlipidemia   . Hypertension   . Hypothyroidism   . PONV (postoperative nausea and vomiting)   . Thyroid disease    Past Surgical History:  Procedure Laterality Date  . ABDOMINAL HYSTERECTOMY    . KNEE SURGERY    . MASTECTOMY W/ SENTINEL NODE BIOPSY Right 03/05/2017   Procedure: BILATERAL TOTAL MASTECTOMIES WITH RIGHT SENTINEL LYMPH NODE BIOPSIES;  Surgeon: Excell Seltzer, MD;  Location: [place];  Service: General;  Laterality: Right;  . SHOULDER SURGERY     BILAT     ALLERGIES:  Allergies  Allergen Reactions  . Adhesive [Tape] Other (See Comments)    (skin tears)  Tolerates PAPER TAPE  . Codeine Nausea Only  . Other Nausea Only    Anesthesia--nausea      CURRENT MEDICATIONS:  Outpatient Encounter Prescriptions as of 09/11/2017  Medication Sig  . albuterol (PROVENTIL,VENTOLIN) 90 MCG/ACT inhaler Inhale 2 puffs into the lungs every 4 (four) hours as needed. (Patient taking differently: Inhale 2 puffs into the lungs every 4 (four) hours as needed for wheezing or shortness of breath. )  . anastrozole (ARIMIDEX) 1 MG tablet Take 1 tablet (1 mg total) by mouth daily.  . diphenhydramine-acetaminophen (TYLENOL PM EXTRA STRENGTH) 25-500 MG TABS tablet Take 2 tablets by mouth at bedtime.  . hyaluronate sodium (RADIAPLEXRX) GEL Apply 1 application topically 2 (two) times daily. 2nd tube given 06/26/17  . imipramine (TOFRANIL) 50 MG tablet Take 4 tablets (200 mg total) by mouth at bedtime.  Marland Kitchen levothyroxine (SYNTHROID, LEVOTHROID) 112 MCG tablet TAKE ONE TABLET BY MOUTH ONCE DAILY (Patient taking differently: Take 112 mcg by mouth daily before breakfast. )  . lisinopril-hydrochlorothiazide (PRINZIDE,ZESTORETIC) 20-25 MG tablet 1 TABLET ONCE A DAY ORALLY 90 DAYS  . metoprolol succinate (TOPROL-XL) 25 MG 24 hr  tablet   . omeprazole (PRILOSEC) 20 MG capsule Take 20 mg by mouth daily.  . simvastatin (ZOCOR) 20 MG tablet Take 1 tablet (20 mg total) by mouth at bedtime.  Marland Kitchen escitalopram (LEXAPRO) 5 MG tablet 1 TABLET ONCE A DAY IN AM ORALLY 30 DAY(S)   No facility-administered encounter medications on file as of 09/11/2017.      ONCOLOGIC FAMILY HISTORY:  Family History  Problem Relation Age of Onset  . Colon cancer Maternal Grandfather 84  . Colon cancer Maternal Aunt 29  . Colon cancer Maternal Uncle        dx in his 64s  . Breast cancer Paternal Aunt 21  . Breast cancer Cousin 47       paternal first cousin  . Lung cancer Paternal Grandfather   . Colon cancer Maternal Uncle   . Head & neck cancer Maternal Uncle        oral cancer     GENETIC COUNSELING/TESTING: See above  SOCIAL HISTORY:  Kimberly Waters is married and lives with her husband of 93 years in [place], [place].  She has 2 children and  5 grand children and they live in [place].  Kimberly Waters is currently retired.  She denies any current or history of tobacco, alcohol, or illicit drug use.     PHYSICAL EXAMINATION:  Vital Signs:   Vitals:   09/11/17 0937  BP: 125/66  Pulse: (!) 104  Resp: 17  Temp: 98.1 F (36.7 C)  SpO2: 94%   Filed Weights   09/11/17 0937  Weight: 197 lb 1.6 oz (89.4 kg)   General: Well-nourished, well-appearing female in no acute distress.  She is unaccompanied today.   HEENT: Head is normocephalic.  Pupils equal and reactive to light. Conjunctivae clear without exudate.  Sclerae anicteric. Oral mucosa is pink, moist.  Oropharynx is pink without lesions or erythema.  Lymph: No cervical, supraclavicular, or infraclavicular lymphadenopathy noted on palpation.  Cardiovascular: Regular rate and rhythm.Marland Kitchen Respiratory: Clear to auscultation bilaterally. Chest expansion symmetric; breathing non-labored.  Breasts: s/p bilateral mastectomies, no nodularity, swelling, masses noted GI:  Abdomen soft and round; non-tender, non-distended. Bowel sounds normoactive.  GU: Deferred.  Neuro: No focal deficits. Steady gait.  Psych: Mood and affect normal and appropriate for situation.  Extremities: No edema. MSK: No focal spinal tenderness to palpation.  Full range of motion in bilateral upper extremities Skin: Warm and dry.  LABORATORY DATA:  None for this visit.  DIAGNOSTIC IMAGING:  None for this visit.      ASSESSMENT AND PLAN:  Ms.. Waters is a pleasant 71 y.o. female with Stage IB right breast invasive ductal carcinoma, ER+/PR+/HER2-, diagnosed in 01/2017, treated with bilateral mastectomies, adjuvant radiation therapy, and anti-estrogen therapy with Anastrozole beginning in 04/2017.  She presents to the Survivorship Clinic for our initial meeting and routine follow-up post-completion of treatment for breast cancer.    1. Stage IB right breast cancer:  Kimberly Waters is continuing to recover from definitive treatment for breast cancer. She will follow-up with her medical oncologist, Dr. Jana Hakim in 09/2017 with history and physical exam per surveillance protocol.  She will continue her anti-estrogen therapy with Anastrozole. Thus far, she is tolerating the Anastrozole well, with minimal side effects. She was instructed to make Dr. Jana Hakim or myself aware if she begins to experience any worsening side effects of the medication and I could see her back in clinic to help manage those side effects, as needed. Today, a comprehensive survivorship care plan and treatment summary was reviewed with the patient today detailing her breast cancer diagnosis, treatment course, potential late/long-term effects of treatment, appropriate follow-up care with recommendations for the future, and patient education resources.  A copy of this summary, along with a letter will be sent to the patient's primary care provider via mail/fax/In Basket message after today's visit.    2. Bone health:  Given Ms.  Waters's age/history of breast cancer and her current treatment regimen including anti-estrogen therapy with Anastrozole, she is at risk for bone demineralization.  Her last DEXA scan was 04/24/2017, which showed normal results.   In the meantime, she was encouraged to increase her consumption of foods rich in calcium, as well as increase her weight-bearing activities.  She was given education on specific activities to promote bone health.  3. Cancer screening:  Due to Kimberly Waters's history and her age, she should receive screening for skin cancers, colon cancer and lung cancer.  She does undergo lung cancer screening annually with low dose CT as ordered by her PCP.  The information and recommendations are listed on the patient's comprehensive care plan/treatment summary and  were reviewed in detail with the patient.    4. Health maintenance and wellness promotion: Kimberly Waters was encouraged to consume 5-7 servings of fruits and vegetables per day. We reviewed the "Nutrition Rainbow" handout, as well as the handout "Take Control of Your Health and Reduce Your Cancer Risk" from the [place].  She was also encouraged to engage in moderate to vigorous exercise for 30 minutes per day most days of the week. We discussed the [employer] fitness program, which is designed for cancer survivors to help them become more physically fit after cancer treatments.  She was instructed to limit her alcohol consumption and continue to abstain from tobacco use.  I congratulated her on her smoking cessation.   5. Support services/counseling: It is not uncommon for this period of the patient's cancer care trajectory to be one of many emotions and stressors.  We discussed an opportunity for her to participate in the next session of [facility] ("Finding Your New Normal") support group series designed for patients after they have completed treatment.   Kimberly Waters was encouraged to take advantage of our many other support  services programs, support groups, and/or counseling in coping with her new life as a cancer survivor after completing anti-cancer treatment.  She was offered support today through active listening and expressive supportive counseling.  She was given information regarding our available services and encouraged to contact me with any questions or for help enrolling in any of our support group/programs.    Dispo:   -Return to cancer center for follow up with Dr. Jana Hakim in 09/2017  -Follow up with Dr. Excell Seltzer at [facility] 11/2017 -Bone density due in 04/25/2019 -Continue with colon cancer, skin cancer, and lung cancer screenings -She is welcome to return back to the Survivorship Clinic at any time; no additional follow-up needed at this time.  -Consider referral back to survivorship as a long-term survivor for continued surveillance  A total of (50) minutes of face-to-face time was spent with this patient with greater than 50% of that time in counseling and care-coordination.   Gardenia Phlegm, NP Survivorship Program [place] [phone]   Note: PRIMARY CARE PROVIDER Josetta Huddle, [place] [phone]

## 2017-10-09 NOTE — Progress Notes (Signed)
Kimberly Waters  [phone] [phone]     ID: Kimberly Waters DOB: 11/18/1946  MR#: [id number]  SLH#:[id number]  Patient Care Team: Josetta Huddle, MD as PCP - General (Internal Medicine) Juanita Craver, MD as Consulting Physician (Gastroenterology) Dishawn Bhargava, Virgie Dad, MD as Consulting Physician (Oncology) Excell Seltzer, MD as Consulting Physician (General Surgery) Gery Pray, MD as Consulting Physician (Radiation Oncology) Delice Bison Charlestine Massed, NP as Nurse Practitioner (Hematology and Oncology) OTHER MD:  CHIEF COMPLAINT: Estrogen receptor positive breast cancer  CURRENT TREATMENT: Anastrozole   BREAST CANCER HISTORY: From the original intake note:  Kimberly Waters had an unremarkable mammogram 07/01/2012, after which she did not obtain further mammography. Sometime late 2017 she noted the right nipple was inverted. She brought this to medical attention and on 01/25/2017 she underwent bilateral mammography with tomography and right breast ultrasonography at [facility]. The breast density was category C. In the central right breast there was an area of architectural distortion associated with nipple retraction. Ultrasound confirmed a 2.2 cm irregular mass in the right breast central to the nipple. In addition there was a 2.5 cm irregular mass in the right breast superiorly. The right axilla was sonographically benign.  On 01/29/2017 she underwent biopsy of both these masses, and both showed morphologically similar invasive ductal carcinomas, grade 1 or 2, with evidence of lymphovascular invasion. Both tumors were 100% estrogen receptor positive with strong staining intensity, and 60-100% progesterone receptor positive, with strong staining intensity. The MIP-1 varied from 5-10%. Both tumors were HER-2 negative, with a signals ratio 1.12-1.39, and the number per cell 2.30-3.97.  The patient's subsequent history is as detailed below   INTERVAL HISTORY: Kimberly Waters returns  today for follow-up and treatment of her estrogen receptor positive breast cancer. She continues on anastrozole. Pt is tolerating it well and has some hot flashes, but not many. She denies vaginal dryness.  She completed her radiation treatments at the end of July. She reports that she became fatigued and still feels tired. Her skin became itchy and she notes right chest wall tenderness.   REVIEW OF SYSTEMS: Kimberly Waters has gained some weight, and reports that she hasn't been exercising very much. Her and her sister have been taking turns taking care of their mother who has dementia. She denies unusual headaches, visual changes, nausea, vomiting, or dizziness. There has been no unusual cough, phlegm production, or pleurisy. This been no change in bowel or bladder habits. She denies unexplained fatigue or unexplained weight loss, bleeding, rash, or fever. A detailed review of systems was otherwise entirely stable.     PAST MEDICAL HISTORY: Past Medical History:  Diagnosis Date  . Allergy   . Breast cancer ([place])   . Cancer ([facility])    BREAST  . Colon polyps   . Depression   . Dyspnea    SEASONAL  . Family history of breast cancer   . Family history of colon cancer   . GERD (gastroesophageal reflux disease)   . History of radiation therapy 06/05/17-07/20/17   right chest wall 50.4 Gy in 28 fractions, right axillary nodal region 45 Gy in 25 fractions  . Hyperlipidemia   . Hypertension   . Hypothyroidism   . PONV (postoperative nausea and vomiting)   . Thyroid disease     PAST SURGICAL HISTORY: Past Surgical History:  Procedure Laterality Date  . ABDOMINAL HYSTERECTOMY    . KNEE SURGERY    . MASTECTOMY W/ SENTINEL NODE BIOPSY Right 03/05/2017   Procedure: BILATERAL TOTAL MASTECTOMIES  WITH RIGHT SENTINEL LYMPH NODE BIOPSIES;  Surgeon: Excell Seltzer, MD;  Location: [facility];  Service: General;  Laterality: Right;  . SHOULDER SURGERY     BILAT    FAMILY HISTORY Family History  Problem  Relation Age of Onset  . Colon cancer Maternal Grandfather 53  . Colon cancer Maternal Aunt 54  . Colon cancer Maternal Uncle        dx in his 38s  . Breast cancer Paternal Aunt 22  . Breast cancer Cousin 71       paternal first cousin  . Lung cancer Paternal Grandfather   . Colon cancer Maternal Uncle   . Head & neck cancer Maternal Uncle        oral cancer   The patient's father died at age 1. Her mother is 98 years old as of February 2018. The patient had no brothers, 1 sister. On the mother's side a grandfather, and an uncle all had colon cancer's. On the father's side there is an aunt and a cousin with breast cancer, both diagnosed in their late 20s. There is no history of ovarian cancer in the family.  GYNECOLOGIC HISTORY:  No LMP recorded. Patient has had a hysterectomy.  menarche age 89, first live birth age 89, the patient is Kimberly Waters. She underwent total abdominal hysterectomy with bilateral salpingo-oophorectomy remotely and took hormone replacement for approximately 6 years.   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 Her husband [name] ("[address] Etzkorn is retired from [employer]. Their son [name] is an Chief Financial Officer living in [place]. Their daughter [name] works at [employer] in [place] the patient has 5 grandchildren. She is a Tourist information centre manager     ADVANCED DIRECTIVES:  not in place    HEALTH MAINTENANCE: Social History  Substance Use Topics  . Smoking status: Former Smoker    Packs/day: 1.00    Years: 45.00    Types: Cigarettes    Quit date: 02/22/2017  . Smokeless tobacco: Never Used  . Alcohol use No     Colonoscopy: 03/18/2014/[facility]  PAP: Status post hysterectomy  Bone density: Never    Allergies  Allergen Reactions  . Adhesive [Tape] Other (See Comments)    (skin tears)  Tolerates PAPER TAPE  . Codeine Nausea Only  . Other Nausea Only    Anesthesia--nausea     Current  Outpatient Prescriptions  Medication Sig Dispense Refill  . albuterol (PROVENTIL,VENTOLIN) 90 MCG/ACT inhaler Inhale 2 puffs into the lungs every 4 (four) hours as needed. (Patient taking differently: Inhale 2 puffs into the lungs every 4 (four) hours as needed for wheezing or shortness of breath. ) 17 g 3  . anastrozole (ARIMIDEX) 1 MG tablet Take 1 tablet (1 mg total) by mouth daily. 90 tablet 4  . diphenhydramine-acetaminophen (TYLENOL PM EXTRA STRENGTH) 25-500 MG TABS tablet Take 2 tablets by mouth at bedtime.    Marland Kitchen escitalopram (LEXAPRO) 5 MG tablet 1 TABLET ONCE A DAY IN AM ORALLY 30 DAY(S)  5  . hyaluronate sodium (RADIAPLEXRX) GEL Apply 1 application topically 2 (two) times daily. 2nd tube given 06/26/17    . imipramine (TOFRANIL) 50 MG tablet Take 4 tablets (200 mg total) by mouth at bedtime. 360 tablet 0  . levothyroxine (SYNTHROID, LEVOTHROID) 112 MCG tablet TAKE ONE TABLET BY MOUTH ONCE DAILY (Patient taking differently: Take 112 mcg by mouth daily before breakfast. ) 30 tablet 0  . lisinopril-hydrochlorothiazide (PRINZIDE,ZESTORETIC)  20-25 MG tablet 1 TABLET ONCE A DAY ORALLY 90 DAYS  0  . metoprolol succinate (TOPROL-XL) 25 MG 24 hr tablet     . omeprazole (PRILOSEC) 20 MG capsule Take 20 mg by mouth daily.    . simvastatin (ZOCOR) 20 MG tablet Take 1 tablet (20 mg total) by mouth at bedtime. 90 tablet 0   No current facility-administered medications for this visit.   A  OBJECTIVE: Middle-aged white woman who had an oxygen saturation of 84% with activity today  Vitals:   10/16/17 1111  BP: (!) 149/71  Pulse: 84  Resp: 18  Temp: 98.2 F (36.8 C)  SpO2: 97%     Body mass index is 38.07 kg/m.    ECOG FS:1 - Symptomatic but completely ambulatory  Sclerae unicteric, EOMs intact Oropharynx clear and moist No cervical or supraclavicular adenopathy Lungs no rales or rhonchi Heart regular rate and rhythm Abd soft, nontender, positive bowel sounds MSK no focal spinal tenderness,  no upper extremity lymphedema Neuro: nonfocal, well oriented, appropriate affect Breasts: Status post bilateral mastectomies. I do not find any evidence of local recurrence. Both axillae are benign.   LAB RESULTS:  CMP     Component Value Date/Time   NA 134 (L) 07/02/2017 1113   K 4.3 07/02/2017 1113   CL 105 03/06/2017 0804   CO2 27 07/02/2017 1113   GLUCOSE 102 07/02/2017 1113   BUN 28.6 (H) 07/02/2017 1113   CREATININE 1.6 (H) 07/02/2017 1113   CALCIUM 9.6 07/02/2017 1113   PROT 7.8 07/02/2017 1113   ALBUMIN 3.7 07/02/2017 1113   AST 15 07/02/2017 1113   ALT 24 07/02/2017 1113   ALKPHOS 59 07/02/2017 1113   BILITOT 0.37 07/02/2017 1113   GFRNONAA 38 (L) 03/06/2017 0804   GFRAA 44 (L) 03/06/2017 0804    INo results found for: SPEP, UPEP  Lab Results  Component Value Date   WBC 4.9 10/16/2017   NEUTROABS 3.6 10/16/2017   HGB 11.5 (L) 10/16/2017   HCT 34.1 (L) 10/16/2017   MCV 91.5 10/16/2017   PLT 262 10/16/2017      Chemistry      Component Value Date/Time   NA 134 (L) 07/02/2017 1113   K 4.3 07/02/2017 1113   CL 105 03/06/2017 0804   CO2 27 07/02/2017 1113   BUN 28.6 (H) 07/02/2017 1113   CREATININE 1.6 (H) 07/02/2017 1113      Component Value Date/Time   CALCIUM 9.6 07/02/2017 1113   ALKPHOS 59 07/02/2017 1113   AST 15 07/02/2017 1113   ALT 24 07/02/2017 1113   BILITOT 0.37 07/02/2017 1113       No results found for: LABCA2  No components found for: LABCA125  No results for input(s): INR in the last 168 hours.  Urinalysis No results found for: COLORURINE, APPEARANCEUR, LABSPEC, PHURINE, GLUCOSEU, HGBUR, BILIRUBINUR, KETONESUR, PROTEINUR, UROBILINOGEN, NITRITE, LEUKOCYTESUR   STUDIES: No results found.  ELIGIBLE FOR AVAILABLE RESEARCH PROTOCOL: no  ASSESSMENT: 71 y.o.[place], [place]  Woman status post biopsy of 2 separate masses in the central right breast 01/29/2017, showing a clinical mT2 N0 invasive ductal carcinoma, stage IB in the  new classification: Both masses being estrogen receptor and progesterone receptor positive, HER-2 not amplified, with an MIB-1 between 5 and 10%.  (1) status post bilateral mastectomies 03/05/2017 showing  (a) on the left, no malignancy  (b) On the right, a pT3 pN0, stage IIA invasive ductal carcinoma, grade 2,  with negative margins    (  c) patient is not interested in reconstruction  (2) Oncotype score of 6 predicts a 10 year risk of recurrence outside the breast of 5% if the patient's only systemic therapy is tamoxifen for 5 years. It also predicts no benefit from chemotherapy.  (3) adjuvant radiation 06/05/17 - 07/20/17 Right Chest Wall/ 50.4 Gy in 28 fractions Right axillaryNodal region// 45 Gy in 25 fractions Mastectomy scar boost 10 gray in 5 fractions   (4) started anastrozole May 2018  (a) bone density at [facility] 04/24/2017 showed a T score of -0.3  (5) genetics testing through the Hereditary Gene Panel offered by [facility] i found no deleterious mutations in APC, ATM, AXIN2, BARD1, BMPR1A, BRCA1, BRCA2, BRIP1, CDH1, CDKN2A (p14ARF), CDKN2A (p16INK4a), CHEK2, DICER1, EPCAM (Deletion/duplication testing only), GREM1 (promoter region deletion/duplication testing only), KIT, MEN1, MLH1, MSH2, MSH6, MUTYH, NBN, NF1, PALB2, PDGFRA, PMS2, POLD1, POLE, PTEN, RAD50, RAD51C, RAD51D, SDHB, SDHC, SDHD, SMAD4, SMARCA4. STK11, TP53, TSC1, TSC2, and VHL.  The following gene was evaluated for sequence changes only: SDHA and HOXB13 c.251G>A variant only  (a) 2 VUS were identified:  MSH6 c.3394G>C and TSC1 c.1481C>T  (6) tobacco abuse: Patient quit smoking 03/03/2017   PLAN: I spent approximately 30 minutes with Kimberly Waters with most of that time spent discussing her complex problems. Kimberly Waters has completed all her local therapy for breast cancer and is tolerating anastrozole well. The plan is to continue anastrozole for a total of 5 years.  She has a normal bone density at baseline and we will repeat a bone density  may of 2020  She has a history of smoking and is hypoxic although asymptomatic. I suggested pulmonary evaluation and she is agreeable. I have placed that referral.  I suggested that she read "the 36 hour day". That should help with her management of her mother.  I strongly encouraged her to start some kind of exercise program to the limit of her tolerance. This will help with the fatigue issue.  Otherwise she will see her surgeon in January and will return to see me in May of next year, after her next mammography. If all is going well then I will start seeing her on a once a year basis thereafter.  She knows to call for any other issues that may develop before her next visit here.  Kimberly Waters, Virgie Dad, MD  10/16/17 11:37 AM Medical Oncology and Hematology [facility] [address] [place],  [address] Tel. [phone]    Fax. [phone]  This document serves as a record of services personally performed by Chauncey Cruel, MD. It was created on her behalf by Margit Banda, a trained medical scribe. The creation of this record is based on the scribe's personal observations and the provider's statements to them. This document has been checked and approved by the attending provider.

## 2017-10-16 ENCOUNTER — Telehealth: Payer: Self-pay | Admitting: Oncology

## 2017-10-16 ENCOUNTER — Encounter: Payer: Self-pay | Admitting: Oncology

## 2017-10-16 ENCOUNTER — Other Ambulatory Visit (HOSPITAL_BASED_OUTPATIENT_CLINIC_OR_DEPARTMENT_OTHER): Payer: Medicare Other

## 2017-10-16 ENCOUNTER — Ambulatory Visit (HOSPITAL_BASED_OUTPATIENT_CLINIC_OR_DEPARTMENT_OTHER): Payer: Medicare Other | Admitting: Oncology

## 2017-10-16 VITALS — BP 149/71 | HR 84 | Temp 98.2°F | Resp 18 | Ht 61.0 in | Wt 201.5 lb

## 2017-10-16 DIAGNOSIS — R0902 Hypoxemia: Secondary | ICD-10-CM

## 2017-10-16 DIAGNOSIS — Z170 Estrogen receptor positive status [ER+]: Secondary | ICD-10-CM | POA: Diagnosis not present

## 2017-10-16 DIAGNOSIS — N951 Menopausal and female climacteric states: Secondary | ICD-10-CM | POA: Diagnosis not present

## 2017-10-16 DIAGNOSIS — C50411 Malignant neoplasm of upper-outer quadrant of right female breast: Secondary | ICD-10-CM | POA: Diagnosis not present

## 2017-10-16 DIAGNOSIS — Z87891 Personal history of nicotine dependence: Secondary | ICD-10-CM

## 2017-10-16 LAB — COMPREHENSIVE METABOLIC PANEL
ALBUMIN: 3.5 g/dL (ref 3.5–5.0)
ALK PHOS: 48 U/L (ref 40–150)
ALT: 25 U/L (ref 0–55)
ANION GAP: 10 meq/L (ref 3–11)
AST: 17 U/L (ref 5–34)
BILIRUBIN TOTAL: 0.4 mg/dL (ref 0.20–1.20)
BUN: 20.3 mg/dL (ref 7.0–26.0)
CO2: 25 mEq/L (ref 22–29)
CREATININE: 1.2 mg/dL — AB (ref 0.6–1.1)
Calcium: 9.3 mg/dL (ref 8.4–10.4)
Chloride: 102 mEq/L (ref 98–109)
EGFR: 45 mL/min/{1.73_m2} — AB (ref >60)
GLUCOSE: 113 mg/dL (ref 70–140)
Potassium: 4.1 mEq/L (ref 3.5–5.1)
Sodium: 137 mEq/L (ref 136–145)
TOTAL PROTEIN: 7.2 g/dL (ref 6.4–8.3)

## 2017-10-16 LAB — CBC WITH DIFFERENTIAL/PLATELET
BASO%: 0.8 % (ref 0.0–2.0)
Basophils Absolute: 0 10*3/uL (ref 0.0–0.1)
EOS ABS: 0 10*3/uL (ref 0.0–0.5)
EOS%: 0.7 % (ref 0.0–7.0)
HCT: 34.1 % — ABNORMAL LOW (ref 34.8–46.6)
HEMOGLOBIN: 11.5 g/dL — AB (ref 11.6–15.9)
LYMPH#: 0.8 10*3/uL — AB (ref 0.9–3.3)
LYMPH%: 16.6 % (ref 14.0–49.7)
MCH: 30.7 pg (ref 25.1–34.0)
MCHC: 33.6 g/dL (ref 31.5–36.0)
MCV: 91.5 fL (ref 79.5–101.0)
MONO#: 0.5 10*3/uL (ref 0.1–0.9)
MONO%: 9.2 % (ref 0.0–14.0)
NEUT#: 3.6 10*3/uL (ref 1.5–6.5)
NEUT%: 72.7 % (ref 38.4–76.8)
PLATELETS: 262 10*3/uL (ref 145–400)
RBC: 3.73 10*6/uL (ref 3.70–5.45)
RDW: 13.4 % (ref 11.2–14.5)
WBC: 4.9 10*3/uL (ref 3.9–10.3)

## 2017-10-16 NOTE — Telephone Encounter (Signed)
Gave patient avs and calendar with appts per 10/23 los.  °

## 2018-01-07 DIAGNOSIS — L820 Inflamed seborrheic keratosis: Secondary | ICD-10-CM | POA: Diagnosis not present

## 2018-01-07 DIAGNOSIS — Z9013 Acquired absence of bilateral breasts and nipples: Secondary | ICD-10-CM | POA: Diagnosis not present

## 2018-01-07 DIAGNOSIS — L918 Other hypertrophic disorders of the skin: Secondary | ICD-10-CM | POA: Diagnosis not present

## 2018-01-07 DIAGNOSIS — L853 Xerosis cutis: Secondary | ICD-10-CM | POA: Diagnosis not present

## 2018-01-07 DIAGNOSIS — D225 Melanocytic nevi of trunk: Secondary | ICD-10-CM | POA: Diagnosis not present

## 2018-01-07 DIAGNOSIS — L814 Other melanin hyperpigmentation: Secondary | ICD-10-CM | POA: Diagnosis not present

## 2018-01-07 DIAGNOSIS — L821 Other seborrheic keratosis: Secondary | ICD-10-CM | POA: Diagnosis not present

## 2018-01-07 NOTE — Telephone Encounter (Signed)
Open encounter clean up.Amani Nodarse R, CMA   

## 2018-01-09 DIAGNOSIS — Z170 Estrogen receptor positive status [ER+]: Secondary | ICD-10-CM | POA: Diagnosis not present

## 2018-01-09 DIAGNOSIS — C50911 Malignant neoplasm of unspecified site of right female breast: Secondary | ICD-10-CM | POA: Diagnosis not present

## 2018-02-04 DIAGNOSIS — J069 Acute upper respiratory infection, unspecified: Secondary | ICD-10-CM | POA: Diagnosis not present

## 2018-02-25 DIAGNOSIS — H9190 Unspecified hearing loss, unspecified ear: Secondary | ICD-10-CM | POA: Diagnosis not present

## 2018-02-25 DIAGNOSIS — I700 Atherosclerosis of aorta: Secondary | ICD-10-CM | POA: Diagnosis not present

## 2018-02-25 DIAGNOSIS — I10 Essential (primary) hypertension: Secondary | ICD-10-CM | POA: Diagnosis not present

## 2018-02-25 DIAGNOSIS — E039 Hypothyroidism, unspecified: Secondary | ICD-10-CM | POA: Diagnosis not present

## 2018-02-25 DIAGNOSIS — E785 Hyperlipidemia, unspecified: Secondary | ICD-10-CM | POA: Diagnosis not present

## 2018-02-25 DIAGNOSIS — F17201 Nicotine dependence, unspecified, in remission: Secondary | ICD-10-CM | POA: Diagnosis not present

## 2018-02-25 DIAGNOSIS — J45998 Other asthma: Secondary | ICD-10-CM | POA: Diagnosis not present

## 2018-02-25 DIAGNOSIS — K219 Gastro-esophageal reflux disease without esophagitis: Secondary | ICD-10-CM | POA: Diagnosis not present

## 2018-02-25 DIAGNOSIS — C50919 Malignant neoplasm of unspecified site of unspecified female breast: Secondary | ICD-10-CM | POA: Diagnosis not present

## 2018-02-25 DIAGNOSIS — J439 Emphysema, unspecified: Secondary | ICD-10-CM | POA: Diagnosis not present

## 2018-02-25 DIAGNOSIS — E559 Vitamin D deficiency, unspecified: Secondary | ICD-10-CM | POA: Diagnosis not present

## 2018-02-25 DIAGNOSIS — F322 Major depressive disorder, single episode, severe without psychotic features: Secondary | ICD-10-CM | POA: Diagnosis not present

## 2018-03-08 ENCOUNTER — Ambulatory Visit (INDEPENDENT_AMBULATORY_CARE_PROVIDER_SITE_OTHER): Payer: Medicare Other | Admitting: Internal Medicine

## 2018-03-08 ENCOUNTER — Encounter: Payer: Self-pay | Admitting: Internal Medicine

## 2018-03-08 VITALS — BP 120/64 | HR 82 | Ht 61.0 in | Wt 199.4 lb

## 2018-03-08 DIAGNOSIS — R053 Chronic cough: Secondary | ICD-10-CM

## 2018-03-08 DIAGNOSIS — R0609 Other forms of dyspnea: Secondary | ICD-10-CM | POA: Diagnosis not present

## 2018-03-08 DIAGNOSIS — R05 Cough: Secondary | ICD-10-CM

## 2018-03-08 DIAGNOSIS — Z87891 Personal history of nicotine dependence: Secondary | ICD-10-CM

## 2018-03-08 MED ORDER — UMECLIDINIUM BROMIDE 62.5 MCG/INH IN AEPB: 1.0000 | INHALATION_SPRAY | Freq: Every day | RESPIRATORY_TRACT | 0 refills | Status: DC

## 2018-03-08 NOTE — Progress Notes (Signed)
Subjective:    Patient ID: Kimberly Waters, female    DOB: 1946/01/31, 72 y.o.   MRN: [id number]  PCP Josetta Huddle, MD   HPI  IOV 03/08/2018  Chief Complaint  Patient presents with  . Pulmonary Consult    dyspnea since she had breast cancer last year, very fatigue, itchy skin     72 year old obese lady with a previous 45 pack smoking history quit 1 year ago when she had double mastectomy for early stage breast cancer.  She tells me for the last few years even preceding her mastectomy she has had insidious onset of shortness of breath that is slowly progressive.  Brought on by climbing 1 flight of stairs and relieved by rest.  No associated chest pain.  There is associated cough and wheezing although not associated with dyspnea per se but happens randomly at night and in the daytime.  Albuterol inhaler helps with the cough and the wheezing.  Associated with the cough and the wheezing is lisinopril intake.  She does not have any orthopnea proximal nocturnal dyspnea.  The dyspnea happens purely at exertion and relieved by rest.  And it is progressive.  In 2016 she had low-dose CT Scan of the chest for lung cancer screening and this did not show any lung cancer.  Follow-up was recommended in 1 year.  It did show evidence of emphysema but she is unaware of this diagnosis.  She has a history of radiation therapy   has a past medical history of Allergy, Breast cancer ([place]), Cancer ([place]), Colon polyps, Depression, Dyspnea, Family history of breast cancer, Family history of colon cancer, GERD (gastroesophageal reflux disease), History of radiation therapy (06/05/17-07/20/17), Hyperlipidemia, Hypertension, Hypothyroidism, PONV (postoperative nausea and vomiting), and Thyroid disease.   reports that she quit smoking about a year ago. Her smoking use included cigarettes. She has a 45.00 pack-year smoking history. she has never used smokeless tobacco.  Past Surgical History:  Procedure Laterality Date    . ABDOMINAL HYSTERECTOMY    . KNEE SURGERY    . MASTECTOMY W/ SENTINEL NODE BIOPSY Right 03/05/2017   Procedure: BILATERAL TOTAL MASTECTOMIES WITH RIGHT SENTINEL LYMPH NODE BIOPSIES;  Surgeon: Excell Seltzer, MD;  Location: [facility];  Service: General;  Laterality: Right;  . SHOULDER SURGERY     BILAT    Allergies  Allergen Reactions  . Adhesive [Tape] Other (See Comments)    (skin tears)  Tolerates PAPER TAPE  . Codeine Nausea Only  . Other Nausea Only    Anesthesia--nausea     Immunization History  Administered Date(s) Administered  . Influenza Whole 12/03/2007, 12/16/2009  . Influenza-Unspecified 09/25/2015  . Pneumococcal Polysaccharide-23 01/11/2010  . Td 02/28/2006    Family History  Problem Relation Age of Onset  . Colon cancer Maternal Grandfather 38  . Colon cancer Maternal Aunt 82  . Colon cancer Maternal Uncle        dx in his 61s  . Breast cancer Paternal Aunt 36  . Breast cancer Cousin 37       paternal first cousin  . Lung cancer Paternal Grandfather   . Colon cancer Maternal Uncle   . Head & neck cancer Maternal Uncle        oral cancer     Current Outpatient Medications:  .  albuterol (PROVENTIL,VENTOLIN) 90 MCG/ACT inhaler, Inhale 2 puffs into the lungs every 4 (four) hours as needed. (Patient taking differently: Inhale 2 puffs into the lungs every 4 (four) hours as needed  for wheezing or shortness of breath. ), Disp: 17 g, Rfl: 3 .  anastrozole (ARIMIDEX) 1 MG tablet, Take 1 tablet (1 mg total) by mouth daily., Disp: 90 tablet, Rfl: 4 .  diphenhydramine-acetaminophen (TYLENOL PM EXTRA STRENGTH) 25-500 MG TABS tablet, Take 2 tablets by mouth at bedtime., Disp: , Rfl:  .  escitalopram (LEXAPRO) 5 MG tablet, 1 TABLET ONCE A DAY IN AM ORALLY 30 DAY(S), Disp: , Rfl: 5 .  hyaluronate sodium (RADIAPLEXRX) GEL, Apply 1 application topically 2 (two) times daily. 2nd tube given 06/26/17, Disp: , Rfl:  .  levothyroxine (SYNTHROID, LEVOTHROID) 112 MCG tablet, TAKE  ONE TABLET BY MOUTH ONCE DAILY (Patient taking differently: Take 112 mcg by mouth daily before breakfast. ), Disp: 30 tablet, Rfl: 0 .  lisinopril-hydrochlorothiazide (PRINZIDE,ZESTORETIC) 20-25 MG tablet, 1 TABLET ONCE A DAY ORALLY 90 DAYS, Disp: , Rfl: 0 .  metoprolol succinate (TOPROL-XL) 25 MG 24 hr tablet, , Disp: , Rfl:  .  omeprazole (PRILOSEC) 20 MG capsule, Take 20 mg by mouth daily., Disp: , Rfl:  .  simvastatin (ZOCOR) 20 MG tablet, Take 1 tablet (20 mg total) by mouth at bedtime., Disp: 90 tablet, Rfl: 0 .  imipramine (TOFRANIL) 50 MG tablet, Take 4 tablets (200 mg total) by mouth at bedtime., Disp: 360 tablet, Rfl: 0    Review of Systems  Constitutional: Negative for fever and unexpected weight change.  HENT: Negative for congestion, dental problem, ear pain, nosebleeds, postnasal drip, rhinorrhea, sinus pressure, sneezing, sore throat and trouble swallowing.   Eyes: Negative for redness and itching.  Respiratory: Positive for cough and shortness of breath. Negative for chest tightness and wheezing.   Cardiovascular: Negative for palpitations and leg swelling.  Gastrointestinal: Negative for nausea and vomiting.  Genitourinary: Negative for dysuria.  Musculoskeletal: Negative for joint swelling.  Skin: Positive for rash.  Neurological: Negative for headaches.  Hematological: Does not bruise/bleed easily.  Psychiatric/Behavioral: Negative for dysphoric mood. The patient is not nervous/anxious.        Objective:   Physical Exam  Constitutional: She is oriented to person, place, and time. She appears well-developed and well-nourished. No distress.  Obese  HENT:  Head: Normocephalic and atraumatic.  Right Ear: External ear normal.  Left Ear: External ear normal.  Mouth/Throat: Oropharynx is clear and moist. No oropharyngeal exudate.  Eyes: Conjunctivae and EOM are normal. Pupils are equal, round, and reactive to light. Right eye exhibits no discharge. Left eye exhibits no  discharge. No scleral icterus.  Neck: Normal range of motion. Neck supple. No JVD present. No tracheal deviation present. No thyromegaly present.  Cardiovascular: Normal rate, regular rhythm, normal heart sounds and intact distal pulses. Exam reveals no gallop and no friction rub.  No murmur heard. Pulmonary/Chest: Effort normal and breath sounds normal. No respiratory distress. She has no wheezes. She has no rales. She exhibits no tenderness.  Abdominal: Soft. Bowel sounds are normal. She exhibits no distension and no mass. There is no tenderness. There is no rebound and no guarding.  Musculoskeletal: Normal range of motion. She exhibits no edema or tenderness.  Lymphadenopathy:    She has no cervical adenopathy.  Neurological: She is alert and oriented to person, place, and time. She has normal reflexes. No cranial nerve deficit. She exhibits normal muscle tone. Coordination normal.  Skin: Skin is warm and dry. No rash noted. She is not diaphoretic. No erythema. No pallor.  Psychiatric: She has a normal mood and affect. Her behavior is normal. Judgment and  thought content normal.  Vitals reviewed.   Vitals:   03/08/18 1135  BP: 120/64  Pulse: 82  SpO2: 96%  Weight: 199 lb 6.4 oz (90.4 kg)  Height: 5\' 1"  (1.549 m)    Estimated body mass index is 37.68 kg/m as calculated from the following:   Height as of this encounter: 5\' 1"  (1.549 m).   Weight as of this encounter: 199 lb 6.4 oz (90.4 kg).       Assessment & Plan:     ICD-10-CM   1. Dyspnea on exertion R06.09   2. Stopped smoking with greater than 40 pack year history Z87.891   3. Chronic cough R05    The cough and wheezing can be contributed by lisinopril.  Shortness of breath could be more commonly related to obesity and diastolic dysfunction with likely emphysema playing a role.  But she is at risk for interstitial lung disease given the fact that she is a radiation therapy and she is got insidious onset of dyspnea that  is progressive.  Plan  Talk to PCP Josetta Huddle, MD and stop lisinopril  - this can contribut to cough and wheeze STart empiric incruse sample daily Use albuterol as needed DO HRCT supine and prone Do full PFT  followup  - return to see me or An APP next few weeks but after completing above     Dr. Brand Males, M.D., [facility].C.P Pulmonary and Critical Care Medicine Staff Physician, [place] Director - Interstitial Lung Disease  Program  Pulmonary [place] [facility] [place], [place], [address]  Pager: [phone], If no answer or between  15:00h - 7:00h: call [phone] Telephone: [phone]

## 2018-03-08 NOTE — Patient Instructions (Addendum)
ICD-10-CM   1. Dyspnea on exertion R06.09   2. Stopped smoking with greater than 40 pack year history Z87.891   3. Chronic cough R05    Talk to PCP Josetta Huddle, MD and stop lisinopril  - this can contribut to cough and wheeze STart empiric incruse sample daily Use albuterol as needed DO HRCT supine and prone Do full PFT  followup  - return to see me or An APP next few weeks but after completing above

## 2018-03-11 DIAGNOSIS — J440 Chronic obstructive pulmonary disease with acute lower respiratory infection: Secondary | ICD-10-CM | POA: Diagnosis not present

## 2018-03-11 DIAGNOSIS — J069 Acute upper respiratory infection, unspecified: Secondary | ICD-10-CM | POA: Diagnosis not present

## 2018-03-13 ENCOUNTER — Inpatient Hospital Stay: Admission: RE | Admit: 2018-03-13 | Payer: Medicare Other | Source: Ambulatory Visit

## 2018-03-13 DIAGNOSIS — J029 Acute pharyngitis, unspecified: Secondary | ICD-10-CM | POA: Diagnosis not present

## 2018-03-15 DIAGNOSIS — J441 Chronic obstructive pulmonary disease with (acute) exacerbation: Secondary | ICD-10-CM | POA: Diagnosis not present

## 2018-03-15 DIAGNOSIS — B379 Candidiasis, unspecified: Secondary | ICD-10-CM | POA: Diagnosis not present

## 2018-03-26 ENCOUNTER — Ambulatory Visit (INDEPENDENT_AMBULATORY_CARE_PROVIDER_SITE_OTHER)
Admission: RE | Admit: 2018-03-26 | Discharge: 2018-03-26 | Disposition: A | Discharge status: Home / Self Care | Payer: Medicare Other | Source: Ambulatory Visit | Attending: Internal Medicine | Admitting: Internal Medicine

## 2018-03-26 DIAGNOSIS — R053 Chronic cough: Secondary | ICD-10-CM

## 2018-03-26 DIAGNOSIS — J439 Emphysema, unspecified: Secondary | ICD-10-CM | POA: Diagnosis not present

## 2018-03-26 DIAGNOSIS — R0609 Other forms of dyspnea: Secondary | ICD-10-CM

## 2018-03-26 DIAGNOSIS — R05 Cough: Secondary | ICD-10-CM | POA: Diagnosis not present

## 2018-03-26 DIAGNOSIS — Z87891 Personal history of nicotine dependence: Secondary | ICD-10-CM

## 2018-03-26 DIAGNOSIS — J90 Pleural effusion, not elsewhere classified: Secondary | ICD-10-CM | POA: Diagnosis not present

## 2018-03-28 ENCOUNTER — Encounter: Payer: Self-pay | Admitting: Acute Care

## 2018-03-28 ENCOUNTER — Ambulatory Visit (INDEPENDENT_AMBULATORY_CARE_PROVIDER_SITE_OTHER): Payer: Medicare Other | Admitting: Internal Medicine

## 2018-03-28 ENCOUNTER — Ambulatory Visit (INDEPENDENT_AMBULATORY_CARE_PROVIDER_SITE_OTHER): Payer: Medicare Other | Admitting: Acute Care

## 2018-03-28 ENCOUNTER — Telehealth: Payer: Self-pay | Admitting: Acute Care

## 2018-03-28 VITALS — BP 140/80 | HR 71 | Ht 62.0 in | Wt 201.2 lb

## 2018-03-28 DIAGNOSIS — R053 Chronic cough: Secondary | ICD-10-CM

## 2018-03-28 DIAGNOSIS — J810 Acute pulmonary edema: Secondary | ICD-10-CM

## 2018-03-28 DIAGNOSIS — G4733 Obstructive sleep apnea (adult) (pediatric): Secondary | ICD-10-CM

## 2018-03-28 DIAGNOSIS — R0609 Other forms of dyspnea: Secondary | ICD-10-CM | POA: Diagnosis not present

## 2018-03-28 DIAGNOSIS — R05 Cough: Secondary | ICD-10-CM

## 2018-03-28 LAB — PULMONARY FUNCTION TEST
DL/VA % pred: 68 %
DL/VA: 3.12 ml/min/mmHg/L
DLCO UNC % PRED: 46 %
DLCO unc: 10.1 ml/min/mmHg
FEF 25-75 PRE: 0.59 L/s
FEF 25-75 Post: 0.73 L/sec
FEF2575-%CHANGE-POST: 24 %
FEF2575-%PRED-POST: 40 %
FEF2575-%PRED-PRE: 32 %
FEV1-%Change-Post: 5 %
FEV1-%Pred-Post: 55 %
FEV1-%Pred-Pre: 52 %
FEV1-Post: 1.15 L
FEV1-Pre: 1.09 L
FEV1FVC-%CHANGE-POST: 3 %
FEV1FVC-%Pred-Pre: 88 %
FEV6-%Change-Post: 1 %
FEV6-%Pred-Post: 62 %
FEV6-%Pred-Pre: 61 %
FEV6-PRE: 1.62 L
FEV6-Post: 1.65 L
FEV6FVC-%Change-Post: 0 %
FEV6FVC-%PRED-PRE: 105 %
FEV6FVC-%Pred-Post: 105 %
FVC-%CHANGE-POST: 1 %
FVC-%PRED-POST: 59 %
FVC-%PRED-PRE: 58 %
FVC-PRE: 1.62 L
FVC-Post: 1.65 L
POST FEV1/FVC RATIO: 70 %
PRE FEV1/FVC RATIO: 67 %
Post FEV6/FVC ratio: 100 %
Pre FEV6/FVC Ratio: 100 %
RV % pred: 113 %
RV: 2.38 L
TLC % pred: 88 %
TLC: 4.21 L

## 2018-03-28 MED ORDER — BUDESONIDE-FORMOTEROL FUMARATE 80-4.5 MCG/ACT IN AERO: 2.0000 | INHALATION_SPRAY | Freq: Two times a day (BID) | RESPIRATORY_TRACT | 0 refills | Status: DC

## 2018-03-28 MED ORDER — TIOTROPIUM BROMIDE MONOHYDRATE 1.25 MCG/ACT IN AERS: 2.0000 | INHALATION_SPRAY | Freq: Two times a day (BID) | RESPIRATORY_TRACT | 0 refills | Status: DC

## 2018-03-28 NOTE — Telephone Encounter (Signed)
Cardiology referral and labs placed.

## 2018-03-28 NOTE — Patient Instructions (Addendum)
It is good to meet you today. Stop Incruse Start Symbicort  2 puffs twice daily Start Spiriva 2 puffs once daily. Rinse mouth after use We will schedule a home sleep test to check for sleep apnea. We will schedule a 2 D Echo  Follow up with Dr. Inda Merlin about increasing your Lexapro. Research Care giver support groups. Increase exercise slowly as able. Look into [employer] near you. Follow up in 1 month with Dr. Chase Caller or Judson Roch NP. Please contact office for sooner follow up if symptoms do not improve or worsen or seek emergency care

## 2018-03-28 NOTE — Assessment & Plan Note (Addendum)
Cough better after stopping ACEI No improvement with Incruse PFT's + for obstruction SP radiation therapy for breast cancer HRCT negative for ILD but + for mild pulmonary edema/ small Right effusion Plan: Stop Incruse Start Symbicort  2 puffs twice daily Start Spiriva 2 puffs once daily. Rinse mouth after use We will schedule a home sleep test to check for sleep apnea. We will schedule a 2 D Echo  Refer to cardiology BMET BNP Consider diuretic after lab work if indicated. Follow up with Dr. Inda Merlin about increasing your Lexapro. Research Care giver support groups. Increase exercise slowly as able. Look into [employer] near you. Follow up in 1 month with Dr. Chase Caller or Judson Roch NP. Please contact office for sooner follow up if symptoms do not improve or worsen or seek emergency care

## 2018-03-28 NOTE — Progress Notes (Signed)
History of Present Illness Kimberly Waters is a 72 y.o. female former smoker ( 45 pack year smoking history quit 02/2017)   With dyspnea on exertion,chronic cough and  history of breast cancer. ( History of radiation therapy)  She is followed by Dr. Chase Caller . ( Consult 03/08/2018)   Synopsis 72 year old obese lady with a previous 45 pack smoking history quit 1 year ago when she had double mastectomy for early stage breast cancer.  She tells me for the last few years even preceding her mastectomy she has had insidious onset of shortness of breath that is slowly progressive.  Brought on by climbing 1 flight of stairs and relieved by rest.  No associated chest pain.  There is associated cough and wheezing although not associated with dyspnea per se but happens randomly at night and in the daytime.  Albuterol inhaler helps with the cough and the wheezing.  Associated with the cough and the wheezing is lisinopril intake.  She does not have any orthopnea proximal nocturnal dyspnea.  The dyspnea happens purely at exertion and relieved by rest.  And it is progressive.  In 2016 she had low-dose CT Scan of the chest for lung cancer screening and this did not show any lung cancer.  Follow-up was recommended in 1 year.  It did show evidence of emphysema but she is unaware of this diagnosis.  She has a history of radiation therapy    03/28/2018 Follow up Dyspnea on exertion. Pt was seen by Dr. Chase Caller 03/08/2018. Plan after that visit was :  Talk to PCP Josetta Huddle, MD and stop lisinopril  - this can contribute to cough and wheeze Start empiric incruse sample daily Use albuterol as needed DO HRCT supine and prone Do full PFT Return to see me or An APP next few weeks but after completing above  She presents today after her PFT's. She states she has had her lisinopril changed per her PCP. She states that this has  made a difference with her cough. Her Daughter states that she has not heard her cough at all  since making the change.  She used the Incruse inhaler x 5 days and she states she cannot really tell if it has helped. She continues to have shortness of breath with minimal exertion. PFT's show borderline COPD.  She is tired and she is very depressed. She spends a great deal of time in bed. 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 She denies fever, chest pain, orthopnea or hemoptysis.We discussed that deconditioning and weight may be playing a part in her dyspnea.She denies back or leg pain.No recent travel.She denies any back pain.  Test Results: PFT's 4/3//2019>> FVC: 1.62, 2.75, 58% FEV1 1.09, 2.07, 52% F/F Ratio: 67,76, 88% SVC 1.83,2.75,66% TLC 88% DLCO: 46%  HRCT 03/26/2018 IMPRESSION: 1. No definitive evidence of interstitial lung disease. 2. Mild pulmonary edema and small right pleural effusion. 3. Aortic atherosclerosis (ICD10-170.0). Coronary artery calcification. 4.  Emphysema (ICD10-J43.9). 5. Hepatic steatosis. 6. Left adrenal adenoma.  03/28/2018 PFT PFT's 4/3//2019>> FVC: 1.62, 2.75, 58% FEV1 1.09, 2.07, 52% F/F Ratio: 67,76, 88% SVC 1.83,2.75,66% TLC 88% DLCO: 46%     CBC Latest Ref Rng & Units 10/16/2017 07/02/2017 05/15/2017  WBC 3.9 - 10.3 10e3/uL 4.9 7.0 6.4  Hemoglobin 11.6 - 15.9 g/dL 11.5(L) 12.5 12.0  Hematocrit 34.8 - 46.6 % 34.1(L) 37.9 37.6  Platelets 145 - 400 10e3/uL 262 238 273    BMP Latest Ref Rng & Units 10/16/2017 07/02/2017 05/15/2017  Glucose 70 - 140 mg/dl 113 102 92  BUN 7.0 - 26.0 mg/dL 20.3 28.6(H) 15.6  Creatinine 0.6 - 1.1 mg/dL 1.2(H) 1.6(H) 1.2(H)  Sodium 136 - 145 mEq/L 137 134(L) 138  Potassium 3.5 - 5.1 mEq/L 4.1 4.3 4.0  Chloride 101 - 111 mmol/L - - -  CO2 22 - 29 mEq/L 25 27 26   Calcium 8.4 - 10.4 mg/dL 9.3 9.6 9.4    BNP No results found for: BNP  ProBNP No results found for: PROBNP  PFT    Component Value Date/Time    FEV1PRE 1.09 03/28/2018 1038   FEV1POST 1.15 03/28/2018 1038   FVCPRE 1.62 03/28/2018 1038   FVCPOST 1.65 03/28/2018 1038   TLC 4.21 03/28/2018 1038   DLCOUNC 10.10 03/28/2018 1038   PREFEV1FVCRT 67 03/28/2018 1038   PSTFEV1FVCRT 70 03/28/2018 1038    Ct Chest High Resolution  Result Date: 03/27/2018 CLINICAL DATA:  Six-month history of increased shortness of breath with exertion. Chronic cough. EXAM: CT CHEST WITHOUT CONTRAST TECHNIQUE: Multidetector CT imaging of the chest was performed following the standard protocol without intravenous contrast. High resolution imaging of the lungs, as well as inspiratory and expiratory imaging, was performed. COMPARISON:  08/25/2015. FINDINGS: Cardiovascular: Atherosclerotic calcification of the arterial vasculature. Coronary artery calcification. Heart is mildly enlarged. No pericardial effusion. Mediastinum/Nodes: Mediastinal lymph nodes are not enlarged by CT size criteria. Hilar regions are difficult to evaluate without IV contrast. No axillary adenopathy. Esophagus is grossly unremarkable. Lungs/Pleura: Moderate centrilobular emphysema. Subpleural radiation fibrosis in the anterior right upper lobe. Scarring in the right middle lobe and lingula. Basilar septal thickening. No definitive subpleural reticulation, traction bronchiectasis/bronchiolectasis, ground-glass, architectural distortion or honeycombing. Probable linear scarring in the medial left upper lobe. Small right pleural effusion. Airway is unremarkable. Upper Abdomen: Visualized portion of the liver is decreased in attenuation diffusely. Right adrenal gland is unremarkable. 1.7 cm fluid density nodule in the left adrenal gland, as before. Visualized portions of the kidneys, spleen, pancreas and stomach are grossly unremarkable. Upper abdominal lymph nodes are not enlarged by CT size criteria. Musculoskeletal: Degenerative changes in the spine. Incidental note is made of cervical ribs. IMPRESSION: 1.  No definitive evidence of interstitial lung disease. 2. Mild pulmonary edema and small right pleural effusion. 3. Aortic atherosclerosis (ICD10-170.0). Coronary artery calcification. 4.  Emphysema (ICD10-J43.9). 5. Hepatic steatosis. 6. Left adrenal adenoma. Electronically Signed   By: Lorin Picket M.D.   On: 03/27/2018 08:55     Past medical hx Past Medical History:  Diagnosis Date  . Allergy   . Breast cancer ([place])   . Cancer ([facility])    BREAST  . Colon polyps   . Depression   . Dyspnea    SEASONAL  . Family history of breast cancer   . Family history of colon cancer   . GERD (gastroesophageal reflux disease)   . History of radiation therapy 06/05/17-07/20/17   right chest wall 50.4 Gy in 28 fractions, right axillary nodal region 45 Gy in 25 fractions  . Hyperlipidemia   . Hypertension   . Hypothyroidism   . PONV (postoperative nausea and vomiting)   . Thyroid disease      Social History   Tobacco Use  . Smoking status: Former Smoker    Packs/day: 1.00    Years: 45.00    Pack years: 45.00    Types: Cigarettes  Last attempt to quit: 02/22/2017    Years since quitting: 1.0  . Smokeless tobacco: Never Used  Substance Use Topics  . Alcohol use: No    Alcohol/week: 0.0 oz  . Drug use: No    Ms.Yambao reports that she quit smoking about 13 months ago. Her smoking use included cigarettes. She has a 45.00 pack-year smoking history. She has never used smokeless tobacco. She reports that she does not drink alcohol or use drugs.  Tobacco Cessation: Former smoker quit 2018  Past surgical hx, Family hx, Social hx all reviewed.  Current Outpatient Medications on File Prior to Visit  Medication Sig  . albuterol (PROVENTIL,VENTOLIN) 90 MCG/ACT inhaler Inhale 2 puffs into the lungs every 4 (four) hours as needed. (Patient taking differently: Inhale 2 puffs into the lungs every 4 (four) hours as needed for wheezing or shortness of breath. )  . anastrozole (ARIMIDEX) 1 MG tablet  Take 1 tablet (1 mg total) by mouth daily.  . diphenhydramine-acetaminophen (TYLENOL PM EXTRA STRENGTH) 25-500 MG TABS tablet Take 2 tablets by mouth at bedtime.  Marland Kitchen escitalopram (LEXAPRO) 5 MG tablet 1 TABLET ONCE A DAY IN AM ORALLY 30 DAY(S)  . hyaluronate sodium (RADIAPLEXRX) GEL Apply 1 application topically 2 (two) times daily. 2nd tube given 06/26/17  . levothyroxine (SYNTHROID, LEVOTHROID) 112 MCG tablet TAKE ONE TABLET BY MOUTH ONCE DAILY (Patient taking differently: Take 112 mcg by mouth daily before breakfast. )  . lisinopril-hydrochlorothiazide (PRINZIDE,ZESTORETIC) 20-25 MG tablet 1 TABLET ONCE A DAY ORALLY 90 DAYS  . metoprolol succinate (TOPROL-XL) 25 MG 24 hr tablet   . omeprazole (PRILOSEC) 20 MG capsule Take 20 mg by mouth daily.  . simvastatin (ZOCOR) 20 MG tablet Take 1 tablet (20 mg total) by mouth at bedtime.  Marland Kitchen umeclidinium bromide (INCRUSE ELLIPTA) 62.5 MCG/INH AEPB Inhale 1 puff into the lungs daily.  Marland Kitchen imipramine (TOFRANIL) 50 MG tablet Take 4 tablets (200 mg total) by mouth at bedtime.   No current facility-administered medications on file prior to visit.      Allergies  Allergen Reactions  . Adhesive [Tape] Other (See Comments)    (skin tears)  Tolerates PAPER TAPE  . Codeine Nausea Only  . Other Nausea Only    Anesthesia--nausea     Review Of Systems:  Constitutional:   No  weight loss, night sweats,  Fevers, chills, +fatigue, or  lassitude.  HEENT:   No headaches,  Difficulty swallowing,  Tooth/dental problems, or  Sore throat,                No sneezing, itching, ear ache, +nasal congestion, +post nasal drip, + HOH  CV:  No chest pain,  Orthopnea, PND, swelling in lower extremities, anasarca, dizziness, palpitations, syncope.   GI  No heartburn, indigestion, abdominal pain, nausea, vomiting, diarrhea, change in bowel habits, loss of appetite, bloody stools.   Resp: + shortness of breath with exertion or at rest.  No excess mucus, no productive cough,   Rare non-productive cough,  No coughing up of blood.  No change in color of mucus.  No wheezing.  No chest wall deformity  Skin: no rash or lesions.  GU: no dysuria, change in color of urine, no urgency or frequency.  No flank pain, no hematuria   MS:  No joint pain or swelling.  No decreased range of motion.  No back pain.  Psych:  No change in mood or affect. + depression or anxiety.  No memory loss.   Vital  Signs BP 140/80 (BP Location: Left Arm, Cuff Size: Large)   Pulse 71   Ht 5\' 2"  (1.575 m)   Wt 201 lb 3.2 oz (91.3 kg)   SpO2 94%   BMI 36.80 kg/m    Physical Exam:  General- No distress,  A&Ox3 x 3, pleasant ENT: No sinus tenderness, TM clear, pale nasal mucosa, no oral exudate,no post nasal drip, no LAN Cardiac: S1, S2, regular rate and rhythm, no murmur Chest: No wheeze/ rales/ dullness; no accessory muscle use, no nasal flaring, no sternal retractions, Bilateral mastectomy. Abd.: Soft Non-tender, ND, Obese, BS + Ext: No clubbing cyanosis, edema Neuro:  Deconditioned at baseline, MAE x 4, A&O x 3 Skin: No rashes, warm and dry Psych: Depressed   Assessment/Plan  Dyspnea on exertion Cough better after stopping ACEI No improvement with Incruse PFT's + for obstruction SP radiation therapy for breast cancer HRCT negative for ILD but + for mild pulmonary edema/ small effusion Plan: Stop Incruse Start Symbicort  2 puffs twice daily Start Spiriva 2 puffs once daily. Rinse mouth after use We will schedule a home sleep test to check for sleep apnea. We will schedule a 2 D Echo  Refer to cardiology BMET BNP Follow up with Dr. Inda Merlin about increasing your Lexapro. Research Care giver support groups. Increase exercise slowly as able. Look into [employer] near you. Follow up in 1 month with Dr. Chase Caller or Judson Roch NP. Please contact office for sooner follow up if symptoms do not improve or worsen or seek emergency care        Magdalen Spatz,  NP 03/28/2018  4:39 PM

## 2018-03-28 NOTE — Telephone Encounter (Signed)
I have called the patient to go over the results of her HRCT. I explained that there is some mild pulmonary edema, and a small collection of fluid..  I have asked her to come to the office for a BMET and BNP tomorrow 4/5. I told her that I will review those labs and let her know a plan for follow up. She verbalized understanding.

## 2018-03-28 NOTE — Progress Notes (Signed)
Brain nat Patient seen in the office today and instructed on use of Symbicort 56mcg and Spiriva 1.6mcg.  Patient expressed understanding and demonstrated technique. TA/CMA

## 2018-03-29 ENCOUNTER — Other Ambulatory Visit: Payer: Self-pay

## 2018-03-29 ENCOUNTER — Other Ambulatory Visit (INDEPENDENT_AMBULATORY_CARE_PROVIDER_SITE_OTHER): Payer: Medicare Other

## 2018-03-29 ENCOUNTER — Ambulatory Visit (HOSPITAL_COMMUNITY): Payer: Medicare Other | Attending: Cardiovascular Disease

## 2018-03-29 DIAGNOSIS — I340 Nonrheumatic mitral (valve) insufficiency: Secondary | ICD-10-CM | POA: Insufficient documentation

## 2018-03-29 DIAGNOSIS — Z87891 Personal history of nicotine dependence: Secondary | ICD-10-CM | POA: Insufficient documentation

## 2018-03-29 DIAGNOSIS — E669 Obesity, unspecified: Secondary | ICD-10-CM | POA: Insufficient documentation

## 2018-03-29 DIAGNOSIS — E785 Hyperlipidemia, unspecified: Secondary | ICD-10-CM | POA: Diagnosis not present

## 2018-03-29 DIAGNOSIS — R0609 Other forms of dyspnea: Secondary | ICD-10-CM

## 2018-03-29 DIAGNOSIS — I119 Hypertensive heart disease without heart failure: Secondary | ICD-10-CM | POA: Diagnosis not present

## 2018-03-29 DIAGNOSIS — Z6836 Body mass index (BMI) 36.0-36.9, adult: Secondary | ICD-10-CM | POA: Insufficient documentation

## 2018-03-29 LAB — BASIC METABOLIC PANEL
BUN: 15 mg/dL (ref 6–23)
CO2: 26 mEq/L (ref 19–32)
Calcium: 8.9 mg/dL (ref 8.4–10.5)
Chloride: 106 mEq/L (ref 96–112)
Creatinine, Ser: 1.09 mg/dL (ref 0.40–1.20)
GFR: 52.71 mL/min — AB (ref >60.00)
GLUCOSE: 96 mg/dL (ref 70–99)
Potassium: 3.7 mEq/L (ref 3.5–5.1)
SODIUM: 142 meq/L (ref 135–145)

## 2018-03-29 LAB — BRAIN NATRIURETIC PEPTIDE: PRO B NATRI PEPTIDE: 221 pg/mL — AB (ref 0.0–100.0)

## 2018-04-01 ENCOUNTER — Encounter (HOSPITAL_COMMUNITY): Payer: Self-pay | Admitting: Emergency Medicine

## 2018-04-01 ENCOUNTER — Emergency Department (HOSPITAL_COMMUNITY): Payer: Medicare Other

## 2018-04-01 ENCOUNTER — Other Ambulatory Visit: Payer: Self-pay

## 2018-04-01 ENCOUNTER — Inpatient Hospital Stay (HOSPITAL_COMMUNITY)
Admission: EM | Admit: 2018-04-01 | Discharge: 2018-04-04 | DRG: 291 | Disposition: A | Discharge status: Home Health | Payer: Medicare Other | Attending: Internal Medicine | Admitting: Internal Medicine

## 2018-04-01 DIAGNOSIS — D649 Anemia, unspecified: Secondary | ICD-10-CM | POA: Diagnosis present

## 2018-04-01 DIAGNOSIS — Z853 Personal history of malignant neoplasm of breast: Secondary | ICD-10-CM

## 2018-04-01 DIAGNOSIS — J449 Chronic obstructive pulmonary disease, unspecified: Secondary | ICD-10-CM | POA: Diagnosis not present

## 2018-04-01 DIAGNOSIS — Z6835 Body mass index (BMI) 35.0-35.9, adult: Secondary | ICD-10-CM

## 2018-04-01 DIAGNOSIS — I5031 Acute diastolic (congestive) heart failure: Secondary | ICD-10-CM

## 2018-04-01 DIAGNOSIS — E669 Obesity, unspecified: Secondary | ICD-10-CM | POA: Diagnosis present

## 2018-04-01 DIAGNOSIS — I5033 Acute on chronic diastolic (congestive) heart failure: Secondary | ICD-10-CM | POA: Diagnosis present

## 2018-04-01 DIAGNOSIS — I509 Heart failure, unspecified: Secondary | ICD-10-CM | POA: Diagnosis not present

## 2018-04-01 DIAGNOSIS — F418 Other specified anxiety disorders: Secondary | ICD-10-CM

## 2018-04-01 DIAGNOSIS — Z91048 Other nonmedicinal substance allergy status: Secondary | ICD-10-CM

## 2018-04-01 DIAGNOSIS — F329 Major depressive disorder, single episode, unspecified: Secondary | ICD-10-CM | POA: Diagnosis present

## 2018-04-01 DIAGNOSIS — Z79890 Hormone replacement therapy (postmenopausal): Secondary | ICD-10-CM

## 2018-04-01 DIAGNOSIS — E039 Hypothyroidism, unspecified: Secondary | ICD-10-CM | POA: Diagnosis present

## 2018-04-01 DIAGNOSIS — F419 Anxiety disorder, unspecified: Secondary | ICD-10-CM | POA: Diagnosis present

## 2018-04-01 DIAGNOSIS — R069 Unspecified abnormalities of breathing: Secondary | ICD-10-CM | POA: Diagnosis not present

## 2018-04-01 DIAGNOSIS — Z9011 Acquired absence of right breast and nipple: Secondary | ICD-10-CM

## 2018-04-01 DIAGNOSIS — Z86010 Personal history of colonic polyps: Secondary | ICD-10-CM

## 2018-04-01 DIAGNOSIS — I110 Hypertensive heart disease with heart failure: Principal | ICD-10-CM | POA: Diagnosis present

## 2018-04-01 DIAGNOSIS — Z923 Personal history of irradiation: Secondary | ICD-10-CM

## 2018-04-01 DIAGNOSIS — E876 Hypokalemia: Secondary | ICD-10-CM | POA: Diagnosis not present

## 2018-04-01 DIAGNOSIS — Z885 Allergy status to narcotic agent status: Secondary | ICD-10-CM

## 2018-04-01 DIAGNOSIS — Z87891 Personal history of nicotine dependence: Secondary | ICD-10-CM | POA: Diagnosis not present

## 2018-04-01 DIAGNOSIS — J9601 Acute respiratory failure with hypoxia: Secondary | ICD-10-CM | POA: Diagnosis present

## 2018-04-01 DIAGNOSIS — Z803 Family history of malignant neoplasm of breast: Secondary | ICD-10-CM

## 2018-04-01 DIAGNOSIS — R0609 Other forms of dyspnea: Secondary | ICD-10-CM

## 2018-04-01 DIAGNOSIS — Z79899 Other long term (current) drug therapy: Secondary | ICD-10-CM

## 2018-04-01 DIAGNOSIS — Z7951 Long term (current) use of inhaled steroids: Secondary | ICD-10-CM

## 2018-04-01 LAB — CBC WITH DIFFERENTIAL/PLATELET
BASOS ABS: 0 10*3/uL (ref 0.0–0.1)
Basophils Absolute: 0 10*3/uL (ref 0.0–0.1)
Basophils Relative: 0 %
Basophils Relative: 0 %
EOS ABS: 0 10*3/uL (ref 0.0–0.7)
Eosinophils Absolute: 0 10*3/uL (ref 0.0–0.7)
Eosinophils Relative: 0 %
Eosinophils Relative: 0 %
HCT: 31.8 % — ABNORMAL LOW (ref 36.0–46.0)
HCT: 34.8 % — ABNORMAL LOW (ref 36.0–46.0)
HEMOGLOBIN: 9.9 g/dL — AB (ref 12.0–15.0)
HEMOGLOBIN: 11.1 g/dL — AB (ref 12.0–15.0)
LYMPHS ABS: 0.8 10*3/uL (ref 0.7–4.0)
LYMPHS PCT: 13 %
Lymphocytes Relative: 6 %
Lymphs Abs: 0.4 10*3/uL — ABNORMAL LOW (ref 0.7–4.0)
MCH: 28.8 pg (ref 26.0–34.0)
MCH: 29.2 pg (ref 26.0–34.0)
MCHC: 31.1 g/dL (ref 30.0–36.0)
MCHC: 31.9 g/dL (ref 30.0–36.0)
MCV: 92.4 fL (ref 78.0–100.0)
MCV: 91.6 fL (ref 78.0–100.0)
Monocytes Absolute: 0.3 10*3/uL (ref 0.1–1.0)
Monocytes Absolute: 0.2 10*3/uL (ref 0.1–1.0)
Monocytes Relative: 5 %
Monocytes Relative: 2 %
NEUTROS PCT: 82 %
NEUTROS PCT: 92 %
Neutro Abs: 4.8 10*3/uL (ref 1.7–7.7)
Neutro Abs: 6.4 10*3/uL (ref 1.7–7.7)
PLATELETS: 282 10*3/uL (ref 150–400)
Platelets: 316 10*3/uL (ref 150–400)
RBC: 3.44 MIL/uL — AB (ref 3.87–5.11)
RBC: 3.8 MIL/uL — ABNORMAL LOW (ref 3.87–5.11)
RDW: 14.2 % (ref 11.5–15.5)
RDW: 14.3 % (ref 11.5–15.5)
WBC: 5.9 10*3/uL (ref 4.0–10.5)
WBC: 7 10*3/uL (ref 4.0–10.5)

## 2018-04-01 LAB — COMPREHENSIVE METABOLIC PANEL
ALK PHOS: 72 U/L (ref 38–126)
ALT: 52 U/L (ref 14–54)
AST: 32 U/L (ref 15–41)
Albumin: 3.3 g/dL — ABNORMAL LOW (ref 3.5–5.0)
Anion gap: 11 (ref 5–15)
BUN: 14 mg/dL (ref 6–20)
CALCIUM: 8.8 mg/dL — AB (ref 8.9–10.3)
CHLORIDE: 107 mmol/L (ref 101–111)
CO2: 24 mmol/L (ref 22–32)
CREATININE: 1.01 mg/dL — AB (ref 0.44–1.00)
GFR calc non Af Amer: 55 mL/min — ABNORMAL LOW (ref >60)
Glucose, Bld: 98 mg/dL (ref 65–99)
Potassium: 3.5 mmol/L (ref 3.5–5.1)
Sodium: 142 mmol/L (ref 135–145)
Total Bilirubin: 0.5 mg/dL (ref 0.3–1.2)
Total Protein: 6.6 g/dL (ref 6.5–8.1)

## 2018-04-01 LAB — I-STAT TROPONIN, ED: Troponin i, poc: 0 ng/mL (ref 0.00–0.08)

## 2018-04-01 LAB — BRAIN NATRIURETIC PEPTIDE: B Natriuretic Peptide: 341.2 pg/mL — ABNORMAL HIGH (ref 0.0–100.0)

## 2018-04-01 LAB — TSH: TSH: 3.977 u[IU]/mL (ref 0.350–4.500)

## 2018-04-01 MED ORDER — IPRATROPIUM-ALBUTEROL 0.5-2.5 (3) MG/3ML IN SOLN
3.0000 mL | Freq: Four times a day (QID) | RESPIRATORY_TRACT | Status: DC
  Administered 2018-04-01 (×2): 3 mL via RESPIRATORY_TRACT
  Filled 2018-04-01: qty 3

## 2018-04-01 MED ORDER — ANASTROZOLE 1 MG PO TABS
1.0000 mg | ORAL_TABLET | Freq: Every day | ORAL | Status: DC
  Administered 2018-04-02 – 2018-04-04 (×3): 1 mg via ORAL
  Filled 2018-04-01 (×3): qty 1

## 2018-04-01 MED ORDER — PANTOPRAZOLE SODIUM 40 MG PO TBEC
40.0000 mg | DELAYED_RELEASE_TABLET | Freq: Every day | ORAL | Status: DC
  Administered 2018-04-02 – 2018-04-04 (×3): 40 mg via ORAL
  Filled 2018-04-01 (×3): qty 1

## 2018-04-01 MED ORDER — SODIUM CHLORIDE 0.9% FLUSH
3.0000 mL | Freq: Two times a day (BID) | INTRAVENOUS | Status: DC
  Administered 2018-04-01 – 2018-04-03 (×6): 3 mL via INTRAVENOUS

## 2018-04-01 MED ORDER — NYSTATIN 100000 UNIT/ML MT SUSP
5.0000 mL | Freq: Four times a day (QID) | OROMUCOSAL | Status: DC
  Administered 2018-04-01 – 2018-04-04 (×11): 500000 [IU] via ORAL
  Filled 2018-04-01 (×14): qty 5

## 2018-04-01 MED ORDER — ACETAMINOPHEN 325 MG PO TABS: 650.0000 mg | ORAL_TABLET | ORAL | Status: DC | PRN

## 2018-04-01 MED ORDER — SODIUM CHLORIDE 0.9 % IV SOLN: 250.0000 mL | INTRAVENOUS | Status: DC | PRN

## 2018-04-01 MED ORDER — ZOLPIDEM TARTRATE 5 MG PO TABS
5.0000 mg | ORAL_TABLET | Freq: Every evening | ORAL | Status: DC | PRN
  Administered 2018-04-02 – 2018-04-03 (×3): 5 mg via ORAL
  Filled 2018-04-01 (×3): qty 1

## 2018-04-01 MED ORDER — ASPIRIN EC 81 MG PO TBEC
81.0000 mg | DELAYED_RELEASE_TABLET | Freq: Every day | ORAL | Status: DC
  Administered 2018-04-01 – 2018-04-04 (×4): 81 mg via ORAL
  Filled 2018-04-01 (×4): qty 1

## 2018-04-01 MED ORDER — ONDANSETRON HCL 4 MG/2ML IJ SOLN: 4.0000 mg | Freq: Four times a day (QID) | INTRAMUSCULAR | Status: DC | PRN

## 2018-04-01 MED ORDER — ESCITALOPRAM OXALATE 10 MG PO TABS
10.0000 mg | ORAL_TABLET | Freq: Every day | ORAL | Status: DC
  Administered 2018-04-01 – 2018-04-04 (×4): 10 mg via ORAL
  Filled 2018-04-01 (×4): qty 1

## 2018-04-01 MED ORDER — FUROSEMIDE 10 MG/ML IJ SOLN
20.0000 mg | Freq: Two times a day (BID) | INTRAMUSCULAR | Status: DC
  Administered 2018-04-01 – 2018-04-03 (×4): 20 mg via INTRAVENOUS
  Filled 2018-04-01 (×4): qty 2

## 2018-04-01 MED ORDER — LISINOPRIL 5 MG PO TABS
2.5000 mg | ORAL_TABLET | Freq: Every day | ORAL | Status: DC
  Administered 2018-04-01 – 2018-04-04 (×4): 2.5 mg via ORAL
  Filled 2018-04-01 (×5): qty 1

## 2018-04-01 MED ORDER — IPRATROPIUM-ALBUTEROL 0.5-2.5 (3) MG/3ML IN SOLN
3.0000 mL | Freq: Four times a day (QID) | RESPIRATORY_TRACT | Status: DC
  Administered 2018-04-02 – 2018-04-03 (×5): 3 mL via RESPIRATORY_TRACT
  Filled 2018-04-01 (×5): qty 3

## 2018-04-01 MED ORDER — FUROSEMIDE 10 MG/ML IJ SOLN
20.0000 mg | Freq: Once | INTRAMUSCULAR | Status: AC
  Administered 2018-04-01: 20 mg via INTRAVENOUS
  Filled 2018-04-01: qty 2

## 2018-04-01 MED ORDER — ENOXAPARIN SODIUM 40 MG/0.4ML ~~LOC~~ SOLN
40.0000 mg | SUBCUTANEOUS | Status: DC
  Administered 2018-04-01 – 2018-04-03 (×3): 40 mg via SUBCUTANEOUS
  Filled 2018-04-01 (×3): qty 0.4

## 2018-04-01 MED ORDER — ALBUTEROL SULFATE (2.5 MG/3ML) 0.083% IN NEBU
2.5000 mg | INHALATION_SOLUTION | RESPIRATORY_TRACT | Status: DC | PRN
  Administered 2018-04-02: 2.5 mg via RESPIRATORY_TRACT
  Filled 2018-04-01: qty 3

## 2018-04-01 MED ORDER — LEVOTHYROXINE SODIUM 112 MCG PO TABS
112.0000 ug | ORAL_TABLET | Freq: Every day | ORAL | Status: DC
  Administered 2018-04-02 – 2018-04-04 (×3): 112 ug via ORAL
  Filled 2018-04-01 (×3): qty 1

## 2018-04-01 MED ORDER — PREDNISONE 20 MG PO TABS
40.0000 mg | ORAL_TABLET | Freq: Every day | ORAL | Status: DC
  Administered 2018-04-01 – 2018-04-04 (×4): 40 mg via ORAL
  Filled 2018-04-01 (×4): qty 2

## 2018-04-01 MED ORDER — SIMVASTATIN 20 MG PO TABS
20.0000 mg | ORAL_TABLET | Freq: Every day | ORAL | Status: DC
  Administered 2018-04-01 – 2018-04-03 (×3): 20 mg via ORAL
  Filled 2018-04-01 (×4): qty 1

## 2018-04-01 MED ORDER — SODIUM CHLORIDE 0.9% FLUSH: 3.0000 mL | INTRAVENOUS | Status: DC | PRN

## 2018-04-01 MED ORDER — METOPROLOL SUCCINATE ER 25 MG PO TB24
25.0000 mg | ORAL_TABLET | Freq: Every day | ORAL | Status: DC
  Administered 2018-04-02 – 2018-04-04 (×3): 25 mg via ORAL
  Filled 2018-04-01 (×3): qty 1

## 2018-04-01 MED ORDER — IPRATROPIUM BROMIDE 0.06 % NA SOLN
2.0000 | Freq: Four times a day (QID) | NASAL | Status: DC
  Administered 2018-04-02 – 2018-04-04 (×9): 2 via NASAL
  Filled 2018-04-01 (×2): qty 15

## 2018-04-01 MED ORDER — IMIPRAMINE HCL 25 MG PO TABS
200.0000 mg | ORAL_TABLET | Freq: Every day | ORAL | Status: DC
  Administered 2018-04-02 – 2018-04-04 (×3): 200 mg via ORAL
  Filled 2018-04-01: qty 4
  Filled 2018-04-01: qty 8
  Filled 2018-04-01 (×3): qty 4

## 2018-04-01 NOTE — ED Notes (Signed)
Called lab to draw blood.

## 2018-04-01 NOTE — ED Triage Notes (Signed)
Pt is here with several weeks of dyspnea on exertion.  Has cards consult and just had echo, concerned for chf.  EMS states patient was originally 91% on room air and dropped to the 80s with minimal exertion.  Pt arrived on 02/3L.  BP 202/104.

## 2018-04-01 NOTE — H&P (Signed)
History and Physical    Kimberly Waters PJK:[id number] DOB: [birth year] DOA: 04/01/2018  PCP: Josetta Huddle, MD Consultants:  Chase Caller - pulmonology; Magrinat - oncology Patient coming from:  Home - lives with husband; NOK: Husband, daughter, son; [phone]  Chief Complaint:  SOB  HPI: Kimberly Waters is a 72 y.o. female with medical history significant of hypothyroidism and breast cancer presenting with SOB.   The patient reports that 3 weeks ago, it all started with a viral respiratory illness.    She went to urgent care [facility] walk-in clinic), got better but still with intermittent SOB.  This past week, allergies started kicking in and getting worse and worse.  Her daughter reports 3+ weeks of SOB, wheezing.  Saw UC - was given a z-pack, developed thrush (still there).  Saw pulm last week, CT done and sent her for echo.  They called and said there was "fluid on her heart".  She was supposed to f/u on Wednesday.  She is ok at rest but her O2 level plumetted.  She would be extremely SOB.  When she sat back down, used MDI (which wasn't helping), symptoms would improve after about 5 minutes.  The same thing wqould happen again the next time.  Her sister called 68.  O2 fell to 87% after 2 steps when EMS was there.  She stays 24/7 with her mom every other week.  O2 sats 92-93% sitting down at her mom's house.  No cough - she did initially but none recently.  She quit smoking 1 year ago.  No fevers.  +severe depression, fatigue, not eating well, lifeless since breast cancer over a year ago.  She was supposed to see Dr. Inda Merlin today "for him to up my nice pills."  Eric Form switched inhalers and said to give it at least a month.  No chest pain.  In the back of her mind, she thinks cancer is somewhere else in her body and she worries about that.  She was last seen by Dr. Jana Hakim in 10/18 for T2 N0 invasive ductal carcinoma.  She had B mastectomies; had negative margins on the right with no cancer on the left;  completed adjuvant radiation; and is planned to be on tamoxifen for 5 years.    The note from Dr. Chase Caller from 3/15 indicates that the cough/wheezing could have been coming from Lisinopril and so this was stopped.  He suspected that her symptoms were more related to obesity and diastolic dysfunction with COPD but since she is at increased risk for interstitial disease a CT was done (negative for ILD).  She was started on Incruse.  She followed up with Eric Form on 4/4.  The Incruse did not improve her symptoms and so she was changed to Symbicort and Spiriva.  Echo was ordered and she was referred to cardiology.   ED Course:   Exertional SOB and hypoxia, progressive.  ?new-onset CHF.  She has seen pulm.  Symptoms worsened today.  67s with EMS.  BNP 341, CXR mild CHF.  No edema.  Echo a few days ago - grade 1 diastolic dysfunction.  Given 20 mg IV Lasix.  Review of Systems: As per HPI; otherwise review of systems reviewed and negative.   Ambulatory Status:  Ambulates without assistance  Past Medical History:  Diagnosis Date  . Allergy   . Breast cancer ([place])   . Cancer ([facility])    BREAST  . Colon polyps   . Depression   . Dyspnea  SEASONAL  . Family history of breast cancer   . Family history of colon cancer   . GERD (gastroesophageal reflux disease)   . History of radiation therapy 06/05/17-07/20/17   right chest wall 50.4 Gy in 28 fractions, right axillary nodal region 45 Gy in 25 fractions  . Hyperlipidemia   . Hypertension   . Hypothyroidism   . PONV (postoperative nausea and vomiting)   . Thyroid disease     Past Surgical History:  Procedure Laterality Date  . ABDOMINAL HYSTERECTOMY    . KNEE SURGERY    . MASTECTOMY W/ SENTINEL NODE BIOPSY Right 03/05/2017   Procedure: BILATERAL TOTAL MASTECTOMIES WITH RIGHT SENTINEL LYMPH NODE BIOPSIES;  Surgeon: Excell Seltzer, MD;  Location: [facility];  Service: General;  Laterality: Right;  . SHOULDER SURGERY     BILAT    Social  History   Socioeconomic History  . Marital status: Married    Spouse name: Not on file  . Number of children: 2  . Years of education: Not on file  . Highest education level: Not on file  Occupational History  . Occupation: reitred [employer]  . Financial resource strain: Not on file  . Food insecurity:    Worry: Not on file    Inability: Not on file  . Transportation needs:    Medical: Not on file    Non-medical: Not on file  Tobacco Use  . Smoking status: Former Smoker    Packs/day: 1.00    Years: 45.00    Pack years: 45.00    Types: Cigarettes    Last attempt to quit: 02/22/2017    Years since quitting: 1.1  . Smokeless tobacco: Never Used  Substance and Sexual Activity  . Alcohol use: No    Alcohol/week: 0.0 oz  . Drug use: No  . Sexual activity: Not on file  Lifestyle  . Physical activity:    Days per week: Not on file    Minutes per session: Not on file  . Stress: Not on file  Relationships  . Social connections:    Talks on phone: Not on file    Gets together: Not on file    Attends religious service: Not on file    Active member of club or organization: Not on file    Attends meetings of clubs or organizations: Not on file    Relationship status: Not on file  . Intimate partner violence:    Fear of current or ex partner: Not on file    Emotionally abused: Not on file    Physically abused: Not on file    Forced sexual activity: Not on file  Other Topics Concern  . Not on file  Social History Narrative  . Not on file    Allergies  Allergen Reactions  . Adhesive [Tape] Other (See Comments)    (skin tears)  Tolerates PAPER TAPE  . Codeine Nausea Only  . Other Nausea Only    Anesthesia--nausea     Family History  Problem Relation Age of Onset  . Colon cancer Maternal Grandfather 95  . Colon cancer Maternal Aunt 65  . Colon cancer Maternal Uncle        dx in his 75s  . Breast cancer Paternal Aunt 47  . Breast cancer  Cousin 74       paternal first cousin  . Lung cancer Paternal Grandfather   . Colon cancer Maternal Uncle   . Head & neck cancer  Maternal Uncle        oral cancer    Prior to Admission medications   Medication Sig Start Date End Date Taking? Authorizing Provider  albuterol (PROVENTIL,VENTOLIN) 90 MCG/ACT inhaler Inhale 2 puffs into the lungs every 4 (four) hours as needed. Patient taking differently: Inhale 2 puffs into the lungs every 4 (four) hours as needed for wheezing or shortness of breath.  04/04/11  Yes Lyndal Pulley, DO  anastrozole (ARIMIDEX) 1 MG tablet Take 1 tablet (1 mg total) by mouth daily. 05/15/17  Yes Magrinat, Virgie Dad, MD  budesonide-formoterol (SYMBICORT) 80-4.5 MCG/ACT inhaler Inhale 2 puffs into the lungs 2 (two) times daily. 03/28/18  Yes Magdalen Spatz, NP  chlorpheniramine (CHLOR-TRIMETON) 4 MG tablet Take 4 mg by mouth 2 (two) times daily as needed for allergies.   Yes [provider]  diphenhydrAMINE (BENADRYL) 25 MG tablet Take 25 mg by mouth as needed for sleep.   Yes [provider]  escitalopram (LEXAPRO) 5 MG tablet 1 TABLET ONCE A DAY IN AM ORALLY 30 DAY(S) 08/23/17  Yes [provider]  imipramine (TOFRANIL) 50 MG tablet Take 4 tablets (200 mg total) by mouth at bedtime. 04/18/14 04/01/18 Yes Marin Olp, MD  ipratropium (ATROVENT) 0.06 % nasal spray Place 2 sprays into the nose 4 (four) times daily. 03/11/18  Yes [provider]  levothyroxine (SYNTHROID, LEVOTHROID) 112 MCG tablet TAKE ONE TABLET BY MOUTH ONCE DAILY Patient taking differently: Take 112 mcg by mouth daily before breakfast.  03/27/14  Yes Leone Haven, MD  metoprolol succinate (TOPROL-XL) 25 MG 24 hr tablet  03/27/17  Yes [provider]  omeprazole (PRILOSEC) 20 MG capsule Take 20 mg by mouth daily.   Yes [provider]  simvastatin (ZOCOR) 20 MG tablet Take 1 tablet (20 mg total) by mouth at bedtime. 03/17/14  Yes Leone Haven,  MD  Tiotropium Bromide Monohydrate (SPIRIVA RESPIMAT) 1.25 MCG/ACT AERS Inhale 2 puffs into the lungs 2 (two) times daily. 03/28/18  Yes Magdalen Spatz, NP  umeclidinium bromide (INCRUSE ELLIPTA) 62.5 MCG/INH AEPB Inhale 1 puff into the lungs daily. Patient not taking: Reported on 04/01/2018 03/08/18   Brand Males, MD    Physical Exam: Vitals:   04/01/18 1045 04/01/18 1100 04/01/18 1145 04/01/18 1153  BP: (!) 183/90 (!) 174/93 (!) 172/91   Pulse: 72 71 68 69  Resp: 16 20 19 19   Temp:      TempSrc:      SpO2: 97% 97% 99% 98%     General:  Appears calm and comfortable and is NAD Eyes:  PERRL, EOMI, normal lids, iris ENT:  grossly normal hearing, lips & tongue, mmm; mild oropharyngeal thrush Neck:  no LAD, masses or thyromegaly; no carotid bruits Cardiovascular:  RRR, no m/r/g. No LE edema.  Respiratory:   CTA bilaterally with no wheezes/rales/rhonchi.  Normal respiratory effort. Abdomen:  soft, NT, ND, NABS Back:   normal alignment, no CVAT Skin:  no rash or induration seen on limited exam Musculoskeletal:  grossly normal tone BUE/BLE, good ROM, no bony abnormality Lower extremity:  No LE edema.  Limited foot exam with no ulcerations.  2+ distal pulses. Psychiatric:  Anxious/blunted mood and affect, speech fluent and appropriate, AOx3 Neurologic:  CN 2-12 grossly intact, moves all extremities in coordinated fashion, sensation intact    Radiological Exams on Admission: Dg Chest 2 View  Result Date: 04/01/2018 CLINICAL DATA:  Exertional dyspnea for the past several weeks. Hypoxia with  exertion. Possible CHF. History of breast malignancy status post right mastectomy., former smoker. EXAM: CHEST - 2 VIEW COMPARISON:  Chest CT scan of March 26, 2018 FINDINGS: The lungs are adequately inflated. There is increased density at the left lung base compatible with pleural fluid layering posteriorly and laterally. No left effusion is observed. The cardiac silhouette is mildly enlarged. The  pulmonary vascularity is mildly prominent centrally. There is calcification in the wall of the aortic arch. The trachea is midline. There is mild multilevel degenerative disc space narrowing of the thoracic spine. IMPRESSION: Mild CHF superimposed upon chronic bronchitic changes. Small right pleural effusion. Thoracic aortic atherosclerosis. Electronically Signed   By: David  Martinique M.D.   On: 04/01/2018 09:44    EKG: Independently reviewed.  NSR with rate 74; IVCD with no evidence of acute ischemia   Labs on Admission: I have personally reviewed the available labs and imaging studies at the time of the admission.  Pertinent labs:   Stable renal function BNP 341.2 Hgb 9.9; 11.5 in 10/18    Assessment/Plan Active Problems:   * No active hospital problems. *   Acute respiratory failure -As noted in Dr. Golden Pop note on 3/15, this appears to be a multifactorial process consisting of diastolic heart failure exacerbation superimposed on underlying COPD -She also appears to have mildly worsening anemia  Diastolic CHF exacerbation -Patient with progressive and worsening SOB and hypoxia despite recent antibiotics(Z-pack) -CXR consistent with pulmonary edema -Normal WBC count, no fever; will not give additional antibiotics at this time -Elevated BNP - uncertain baseline -With elevated BNP and abnl CXR, new-onset CHF seems much more probable as diagnosis -Will place in observation status with telemetry -Echocardiogram last week confirms grade 1 diastolic heart failure -Will start ASA -Will start Lisinopril 2.5 mg daily (this medication was stopped by pulm last month because it was thought to be a culprit of symptoms; will resume at very low dose now due to CHF) -Continue beta blocker -CHF order set utilized -Was given Lasix 20 mg x 1 in ER and will repeat with 20 mg BID -Continue [place] O2 for now -Normal kidney function at this time, will follow -Repeat EKG in AM  COPD Underlying COPD  without clear exacerbation -Patient's shortness of breath is also likely related to underlying COPD. -While she did not have a known diagnosis of COPD prior, imaging and history indicate that she does have this issue.  -She does not have fever or leukocytosis. Chest x-ray is not consistent with pneumonia -She was started on inhaled bronchodilators and steroids with pulm clinic but for now will use scheduled Duoneb and prn albuterol -Will give 5 day course of steroids, as well, as family describes persistent wheezing and apparent bronchospasm  Anemia -Baseline Hgb appears to be in 11-12 range; current Hgb is 9.9 -This may be related to her progressive SOB -Will check stool guaiac  -This is a normocytic anemia and so is likely due to chronic disease if she does not have acute blood loss  Depression/Anxiety -Patient with chronic underlying depression -Significant life stressors - very upset about her 63yo mother with dementia.  She and her sister have been alternating week on/week off and are both suffering both mentally and physically. -While she understands that medication will not help the life stressors, she would like to try to help herself to cope better by increasing her Lexapro. -Dose increased to 10 mg PO daily.   DVT prophylaxis: Lovenox  Code Status:  Full - confirmed with  patient Family Communication: Husband and daughter were present throughout evaluation Disposition Plan:  Home once clinically improved Consults called: CM/SW/PT/OT/Nutrition/RT  Admission status: It is my clinical opinion that referral for OBSERVATION is reasonable and necessary in this patient based on the above information provided. The aforementioned taken together are felt to place the patient at high risk for further clinical deterioration. However it is anticipated that the patient may be medically stable for discharge from the hospital within 24 to 48 hours.     Karmen Bongo MD [facility]  If note is complete, please contact covering daytime or nighttime physician. [email or web address] Password [facility]  04/01/2018, 12:21 PM

## 2018-04-01 NOTE — ED Provider Notes (Signed)
[facility] DEPART[facility] Provider Note   CSN: [id number] Arrival date & time: 04/01/18  0847     History   Chief Complaint Chief Complaint  Patient presents with  . Shortness of Breath    HPI Kimberly Waters is a 72 y.o. female.  Patient with history of emphysema on Spiriva and albuterol, Symbicort, history of breast cancer status post radiation therapy and double mastectomy, hypertension on lisinopril-HCTZ and metoprolol --presents to the emergency department today with complaint of progressively worsening shortness of breath.  Patient has undergone a workup recently for the same.  Symptoms have been progressively worsening over the past several weeks.  She has had PFTs which showed shown mild COPD.  She had a chest CT which did not demonstrate any fibrosis or interstitial lung disease.  She did have mild pulmonary edema on CT.  Echocardiogram performed 4 days ago showed EF 55-60% with grade 1 diastolic heart failure.  Patient states that her breathing is worse with any activity.  EMS today noted O2 saturation to 87% with minimal exertion.  Patient feels more short of breath when she lies flat but denies any lower extremity swelling or recent weight changes.  She does not have any chest pain.  No fever or cough.  EMS noted blood pressure 202/104.  She was placed on 3 L nasal cannula O2.     Past Medical History:  Diagnosis Date  . Allergy   . Breast cancer ([place])   . Cancer (HCC)    BREAST  . Colon polyps   . Depression   . Dyspnea    SEASONAL  . Family history of breast cancer   . Family history of colon cancer   . GERD (gastroesophageal reflux disease)   . History of radiation therapy 06/05/17-07/20/17   right chest wall 50.4 Gy in 28 fractions, right axillary nodal region 45 Gy in 25 fractions  . Hyperlipidemia   . Hypertension   . Hypothyroidism   . PONV (postoperative nausea and vomiting)   . Thyroid disease     Patient Active Problem List   Diagnosis Date Noted  . Dyspnea on exertion 03/28/2018  . Genetic testing 03/14/2017  . Colon polyps   . Family history of colon cancer   . Family history of breast cancer   . Malignant neoplasm of upper-outer quadrant of right breast in female, estrogen receptor positive ([place]) 02/06/2017  . Hypotension 12/31/2012  . Asthma exacerbation 12/30/2012  . Dehydration 12/30/2012  . Viral gastroenteritis 12/30/2012  . Vitamin D deficiency 04/16/2012  . ASTHMA, PERSISTENT 04/24/2007  . HYPOTHYROIDISM, UNSPECIFIED 02/21/2007  . HYPERTRIGLYCERIDEMIA 02/21/2007  . HYPERLIPIDEMIA 02/21/2007  . DEPRESSION, MAJOR, RECURRENT 02/21/2007  . TOBACCO DEPENDENCE 02/21/2007  . HYPERTENSION, BENIGN SYSTEMIC 02/21/2007  . SINUSITIS, CHRONIC, NOS 02/21/2007  . RHINITIS, ALLERGIC 02/21/2007  . DIVERTICULOSIS OF COLON 02/21/2007  . HEARTBURN 02/21/2007  . INCONTINENCE, URGE 02/21/2007    Past Surgical History:  Procedure Laterality Date  . ABDOMINAL HYSTERECTOMY    . KNEE SURGERY    . MASTECTOMY W/ SENTINEL NODE BIOPSY Right 03/05/2017   Procedure: BILATERAL TOTAL MASTECTOMIES WITH RIGHT SENTINEL LYMPH NODE BIOPSIES;  Surgeon: Excell Seltzer, MD;  Location: [facility];  Service: General;  Laterality: Right;  . SHOULDER SURGERY     BILAT     OB History   None      Home Medications    Prior to Admission medications   Medication Sig Start Date End Date Taking? Authorizing Provider  albuterol (PROVENTIL,VENTOLIN) 90 MCG/ACT inhaler Inhale 2 puffs into the lungs every 4 (four) hours as needed. Patient taking differently: Inhale 2 puffs into the lungs every 4 (four) hours as needed for wheezing or shortness of breath.  04/04/11  Yes Lyndal Pulley, DO  anastrozole (ARIMIDEX) 1 MG tablet Take 1 tablet (1 mg total) by mouth daily. 05/15/17  Yes Magrinat, Virgie Dad, MD  budesonide-formoterol (SYMBICORT) 80-4.5 MCG/ACT inhaler Inhale 2 puffs into the lungs 2 (two) times daily. 03/28/18  Yes Magdalen Spatz, NP   chlorpheniramine (CHLOR-TRIMETON) 4 MG tablet Take 4 mg by mouth 2 (two) times daily as needed for allergies.   Yes [provider]  diphenhydrAMINE (BENADRYL) 25 MG tablet Take 25 mg by mouth as needed for sleep.   Yes [provider]  escitalopram (LEXAPRO) 5 MG tablet 1 TABLET ONCE A DAY IN AM ORALLY 30 DAY(S) 08/23/17  Yes [provider]  imipramine (TOFRANIL) 50 MG tablet Take 4 tablets (200 mg total) by mouth at bedtime. 04/18/14 04/01/18 Yes Marin Olp, MD  ipratropium (ATROVENT) 0.06 % nasal spray Place 2 sprays into the nose 4 (four) times daily. 03/11/18  Yes [provider]  levothyroxine (SYNTHROID, LEVOTHROID) 112 MCG tablet TAKE ONE TABLET BY MOUTH ONCE DAILY Patient taking differently: Take 112 mcg by mouth daily before breakfast.  03/27/14  Yes Leone Haven, MD  metoprolol succinate (TOPROL-XL) 25 MG 24 hr tablet  03/27/17  Yes [provider]  omeprazole (PRILOSEC) 20 MG capsule Take 20 mg by mouth daily.   Yes [provider]  simvastatin (ZOCOR) 20 MG tablet Take 1 tablet (20 mg total) by mouth at bedtime. 03/17/14  Yes Leone Haven, MD  Tiotropium Bromide Monohydrate (SPIRIVA RESPIMAT) 1.25 MCG/ACT AERS Inhale 2 puffs into the lungs 2 (two) times daily. 03/28/18  Yes Magdalen Spatz, NP  umeclidinium bromide (INCRUSE ELLIPTA) 62.5 MCG/INH AEPB Inhale 1 puff into the lungs daily. Patient not taking: Reported on 04/01/2018 03/08/18   Brand Males, MD    Family History Family History  Problem Relation Age of Onset  . Colon cancer Maternal Grandfather 45  . Colon cancer Maternal Aunt 81  . Colon cancer Maternal Uncle        dx in his 13s  . Breast cancer Paternal Aunt 81  . Breast cancer Cousin 29       paternal first cousin  . Lung cancer Paternal Grandfather   . Colon cancer Maternal Uncle   . Head & neck cancer Maternal Uncle        oral cancer    Social History Social History   Tobacco Use  .  Smoking status: Former Smoker    Packs/day: 1.00    Years: 45.00    Pack years: 45.00    Types: Cigarettes    Last attempt to quit: 02/22/2017    Years since quitting: 1.1  . Smokeless tobacco: Never Used  Substance Use Topics  . Alcohol use: No    Alcohol/week: 0.0 oz  . Drug use: No     Allergies   Adhesive [tape]; Codeine; and Other   Review of Systems Review of Systems  Constitutional: Negative for diaphoresis, fever and unexpected weight change.  Eyes: Negative for redness.  Respiratory: Positive for shortness of breath. Negative for cough.   Cardiovascular: Negative for chest pain, palpitations and leg swelling.  Gastrointestinal: Negative for abdominal pain, nausea and vomiting.  Genitourinary: Negative for dysuria.  Musculoskeletal: Negative for  back pain and neck pain.  Skin: Negative for rash.  Neurological: Negative for syncope and light-headedness.  Psychiatric/Behavioral: The patient is not nervous/anxious.      Physical Exam Updated Vital Signs BP (!) 185/61   Pulse 73   Temp 98.4 F (36.9 C) (Oral)   SpO2 99%   Physical Exam  Constitutional: She appears well-developed and well-nourished.  HENT:  Head: Normocephalic and atraumatic.  Mouth/Throat: Mucous membranes are normal. Mucous membranes are not dry.  Dry mucous membranes  Eyes: Conjunctivae are normal.  Neck: Trachea normal and normal range of motion. Neck supple. Normal carotid pulses and no JVD present. No muscular tenderness present. Carotid bruit is not present. No tracheal deviation present.  Cardiovascular: Normal rate, regular rhythm, S1 normal, S2 normal, normal heart sounds and intact distal pulses. Exam reveals no decreased pulses.  No murmur heard. Pulmonary/Chest: Effort normal. No respiratory distress. She has decreased breath sounds. She has no wheezes. She has no rhonchi. She has no rales. She exhibits no tenderness.  Abdominal: Soft. Normal aorta and bowel sounds are normal. There  is no tenderness. There is no rebound and no guarding.  Musculoskeletal: Normal range of motion.  Neurological: She is alert.  Skin: Skin is warm and dry. She is not diaphoretic. No cyanosis. No pallor.  Psychiatric: She has a normal mood and affect.  Nursing note and vitals reviewed.    ED Treatments / Results  Labs (all labs ordered are listed, but only abnormal results are displayed) Labs Reviewed  CBC WITH DIFFERENTIAL/PLATELET - Abnormal; Notable for the following components:      Result Value   RBC 3.44 (*)    Hemoglobin 9.9 (*)    HCT 31.8 (*)    All other components within normal limits  COMPREHENSIVE METABOLIC PANEL - Abnormal; Notable for the following components:   Creatinine, Ser 1.01 (*)    Calcium 8.8 (*)    Albumin 3.3 (*)    GFR calc non Af Amer 55 (*)    All other components within normal limits  BRAIN NATRIURETIC PEPTIDE - Abnormal; Notable for the following components:   B Natriuretic Peptide 341.2 (*)    All other components within normal limits  I-STAT TROPONIN, ED     Radiology Dg Chest 2 View  Result Date: 04/01/2018 CLINICAL DATA:  Exertional dyspnea for the past several weeks. Hypoxia with exertion. Possible CHF. History of breast malignancy status post right mastectomy., former smoker. EXAM: CHEST - 2 VIEW COMPARISON:  Chest CT scan of March 26, 2018 FINDINGS: The lungs are adequately inflated. There is increased density at the left lung base compatible with pleural fluid layering posteriorly and laterally. No left effusion is observed. The cardiac silhouette is mildly enlarged. The pulmonary vascularity is mildly prominent centrally. There is calcification in the wall of the aortic arch. The trachea is midline. There is mild multilevel degenerative disc space narrowing of the thoracic spine. IMPRESSION: Mild CHF superimposed upon chronic bronchitic changes. Small right pleural effusion. Thoracic aortic atherosclerosis. Electronically Signed   By: David   Martinique M.D.   On: 04/01/2018 09:44    Procedures Procedures (including critical care time)  Medications Ordered in ED Medications  furosemide (LASIX) injection 20 mg (20 mg Intravenous Given 04/01/18 1153)     Initial Impression / Assessment and Plan / ED Course  I have reviewed the triage vital signs and the nursing notes.  Pertinent labs & imaging results that were available during my care of the patient were  reviewed by me and considered in my medical decision making (see chart for details).     Patient seen and examined.  History per patient and EMS.  Previous testing reviewed.  Patient had a BNP in the 200s.  Blood pressure elevated on arrival.  Vital signs reviewed and are as follows: BP (!) 185/61   Pulse 73   Temp 98.4 F (36.9 C) (Oral)   SpO2 99%  (on O2)  12:28 PM Patient stable during ED. Updated patient and family. BP is improved without treatment.   Will admit for SOB, hypoxia, suspected HFpEF.   Final Clinical Impressions(s) / ED Diagnoses   Final diagnoses:  Acute diastolic congestive heart failure (HCC)  Dyspnea on exertion   Admit -- hypoxia, suspected CHF.   ED Discharge Orders    None       Suann Larry 04/01/18 [place], MD 04/02/18 [phone]

## 2018-04-01 NOTE — Plan of Care (Signed)
  Problem: Elimination: Goal: Will not experience complications related to bowel motility Outcome: Progressing   Problem: Safety: Goal: Ability to remain free from injury will improve Outcome: Progressing   

## 2018-04-01 NOTE — ED Notes (Signed)
Ordered dinner tray.  

## 2018-04-01 NOTE — Progress Notes (Signed)
Pt. Requesting med to help her sleep. On call for [facility] paged to make aware.  

## 2018-04-01 NOTE — ED Notes (Signed)
ED Provider at bedside. 

## 2018-04-01 NOTE — ED Notes (Signed)
Pt eating supper and tolerating well

## 2018-04-02 ENCOUNTER — Other Ambulatory Visit: Payer: Self-pay

## 2018-04-02 DIAGNOSIS — Z7951 Long term (current) use of inhaled steroids: Secondary | ICD-10-CM | POA: Diagnosis not present

## 2018-04-02 DIAGNOSIS — E039 Hypothyroidism, unspecified: Secondary | ICD-10-CM | POA: Diagnosis present

## 2018-04-02 DIAGNOSIS — R0609 Other forms of dyspnea: Secondary | ICD-10-CM | POA: Diagnosis not present

## 2018-04-02 DIAGNOSIS — J449 Chronic obstructive pulmonary disease, unspecified: Secondary | ICD-10-CM | POA: Diagnosis present

## 2018-04-02 DIAGNOSIS — Z91048 Other nonmedicinal substance allergy status: Secondary | ICD-10-CM | POA: Diagnosis not present

## 2018-04-02 DIAGNOSIS — I110 Hypertensive heart disease with heart failure: Secondary | ICD-10-CM | POA: Diagnosis present

## 2018-04-02 DIAGNOSIS — D649 Anemia, unspecified: Secondary | ICD-10-CM | POA: Diagnosis present

## 2018-04-02 DIAGNOSIS — Z9011 Acquired absence of right breast and nipple: Secondary | ICD-10-CM | POA: Diagnosis not present

## 2018-04-02 DIAGNOSIS — I5033 Acute on chronic diastolic (congestive) heart failure: Secondary | ICD-10-CM | POA: Diagnosis present

## 2018-04-02 DIAGNOSIS — E669 Obesity, unspecified: Secondary | ICD-10-CM | POA: Diagnosis present

## 2018-04-02 DIAGNOSIS — Z6835 Body mass index (BMI) 35.0-35.9, adult: Secondary | ICD-10-CM | POA: Diagnosis not present

## 2018-04-02 DIAGNOSIS — F419 Anxiety disorder, unspecified: Secondary | ICD-10-CM | POA: Diagnosis not present

## 2018-04-02 DIAGNOSIS — I5031 Acute diastolic (congestive) heart failure: Secondary | ICD-10-CM | POA: Diagnosis not present

## 2018-04-02 DIAGNOSIS — J9601 Acute respiratory failure with hypoxia: Secondary | ICD-10-CM | POA: Diagnosis present

## 2018-04-02 DIAGNOSIS — Z853 Personal history of malignant neoplasm of breast: Secondary | ICD-10-CM | POA: Diagnosis not present

## 2018-04-02 DIAGNOSIS — Z803 Family history of malignant neoplasm of breast: Secondary | ICD-10-CM | POA: Diagnosis not present

## 2018-04-02 DIAGNOSIS — Z86010 Personal history of colonic polyps: Secondary | ICD-10-CM | POA: Diagnosis not present

## 2018-04-02 DIAGNOSIS — Z79890 Hormone replacement therapy (postmenopausal): Secondary | ICD-10-CM | POA: Diagnosis not present

## 2018-04-02 DIAGNOSIS — Z87891 Personal history of nicotine dependence: Secondary | ICD-10-CM | POA: Diagnosis not present

## 2018-04-02 DIAGNOSIS — F329 Major depressive disorder, single episode, unspecified: Secondary | ICD-10-CM | POA: Diagnosis present

## 2018-04-02 DIAGNOSIS — E876 Hypokalemia: Secondary | ICD-10-CM | POA: Diagnosis not present

## 2018-04-02 DIAGNOSIS — Z79899 Other long term (current) drug therapy: Secondary | ICD-10-CM | POA: Diagnosis not present

## 2018-04-02 DIAGNOSIS — Z923 Personal history of irradiation: Secondary | ICD-10-CM | POA: Diagnosis not present

## 2018-04-02 DIAGNOSIS — Z885 Allergy status to narcotic agent status: Secondary | ICD-10-CM | POA: Diagnosis not present

## 2018-04-02 LAB — BASIC METABOLIC PANEL
Anion gap: 11 (ref 5–15)
BUN: 14 mg/dL (ref 6–20)
CO2: 25 mmol/L (ref 22–32)
CREATININE: 1.06 mg/dL — AB (ref 0.44–1.00)
Calcium: 8.8 mg/dL — ABNORMAL LOW (ref 8.9–10.3)
Chloride: 102 mmol/L (ref 101–111)
GFR calc Af Amer: >60 mL/min (ref >60)
GFR, EST NON AFRICAN AMERICAN: 52 mL/min — AB (ref >60)
Glucose, Bld: 98 mg/dL (ref 65–99)
POTASSIUM: 3.2 mmol/L — AB (ref 3.5–5.1)
SODIUM: 138 mmol/L (ref 135–145)

## 2018-04-02 MED ORDER — POTASSIUM CHLORIDE CRYS ER 20 MEQ PO TBCR
40.0000 meq | EXTENDED_RELEASE_TABLET | Freq: Once | ORAL | Status: AC
  Administered 2018-04-02: 40 meq via ORAL
  Filled 2018-04-02: qty 2

## 2018-04-02 NOTE — Evaluation (Signed)
Physical Therapy Evaluation Patient Details Name: Kimberly Waters MRN: [id number] DOB: [birth year] Today's Date: 04/02/2018   History of Present Illness  Pt is a 72 y.o. female admitted 04/01/18 with CHF exacerbation. PMH includes HTN, breast CA, depression.   Clinical Impression  Pt presents with an overall decrease in functional mobility secondary to above. PTA, pt indep with household amb, but has recently been limited to short distance due to significant DOE; husband available 24/7 and provides intermittent assist for IADLs. Today, pt benefitted from use of RW when ambulating for added stability and energy conservation. Easily distracted by DOE, requiring education and multiple cues for rest and deep breathing technique. SpO2 down to 86%, quickly returning to >90% on RA.   Pt would benefit from initial HHPT evaluation for home safety, but would benefit long-term from pulmonary rehab (does not qualify for cardiac rehab II due to current EF); pt in agreement with this and interested in center at [facility]. Will follow acutely to address established goals.     Follow Up Recommendations Home health PT;Supervision for mobility/OOB(as well as outpatient Cardiopulmonary rehab)    Equipment Recommendations  Other (comment)(rollator)    Recommendations for Other Services       Precautions / Restrictions Precautions Precautions: Fall Restrictions Weight Bearing Restrictions: No      Mobility  Bed Mobility Overal bed mobility: Modified Independent             General bed mobility comments: Increased time; no physical assist required  Transfers Overall transfer level: Needs assistance Equipment used: None;Rolling walker (2 wheeled) Transfers: Sit to/from Stand Sit to Stand: Supervision         General transfer comment: Supervision standing from bed and toilet with no DME. Practiced sit<>stand with RW, requiring cues for hand placement  Ambulation/Gait Ambulation/Gait  assistance: Supervision;Min guard Ambulation Distance (Feet): 120 Feet     Gait velocity: Decreased Gait velocity interpretation: Below normal speed for age/gender General Gait Details: Amb in room with no DME and intermittent min guard for balance. Pt requesting use of RW, able to amb an additional 100' with it and supervision; 2x standing rest break secondary to DOE. Reports stability much improved with RW  Stairs            Wheelchair Mobility    Modified Rankin (Stroke Patients Only)       Balance Overall balance assessment: Needs assistance   Sitting balance-Leahy Scale: Good Sitting balance - Comments: Indep to don socks sitting EOB; DOE with this   Standing balance support: No upper extremity supported;During functional activity Standing balance-Leahy Scale: Fair Standing balance comment: Reports stability improved with BUE support on RW                             Pertinent Vitals/Pain Pain Assessment: No/denies pain    Home Living Family/patient expects to be discharged to:: Private residence Living Arrangements: Spouse/significant other Available Help at Discharge: Family;Available 24 hours/day Type of Home: House Home Access: Stairs to enter Entrance Stairs-Rails: Psychiatric nurse of Steps: 5 Home Layout: Multi-level;Laundry or work area in basement;Able to live on main level with bedroom/bathroom Home Equipment: Grab bars - tub/shower;Wheelchair - manual      Prior Function Level of Independence: Needs assistance   Gait / Transfers Assistance Needed: Reports indep with no DME. Has had 2 falls in past 6 months (one due to LOB, one due to "blacking out"). Reports only able  to amb room-to-room before significant DOE  ADL's / Homemaking Assistance Needed: Indep with ADLs. Requires assist for household tasks and heavier IADLs due to DOE. 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  Comments: Daughter reports pt  typically sedentary and primarily sits/lays in bed     Hand Dominance        Extremity/Trunk Assessment   Upper Extremity Assessment Upper Extremity Assessment: Overall WFL for tasks assessed    Lower Extremity Assessment Lower Extremity Assessment: Overall WFL for tasks assessed       Communication   Communication: No difficulties  Cognition Arousal/Alertness: Awake/alert Behavior During Therapy: WFL for tasks assessed/performed Overall Cognitive Status: Impaired/Different from baseline Area of Impairment: Attention;Safety/judgement                   Current Attention Level: Selective     Safety/Judgement: Decreased awareness of safety     General Comments: Pt with decreased attention/slightly impulsive with movement/tangential with speech, requiring cues to attend to task and for safety; specifically to stop talking/rest in order to catch her breath      General Comments General comments (skin integrity, edema, etc.): Daughter and husband present throughout session. SpO2 down to 86% post-amb, quickly returning to >90% on RA with deep breathing    Exercises     Assessment/Plan    PT Assessment Patient needs continued PT services  PT Problem List Decreased activity tolerance;Decreased balance;Decreased mobility;Decreased knowledge of use of DME;Cardiopulmonary status limiting activity       PT Treatment Interventions DME instruction;Gait training;Stair training;Functional mobility training;Therapeutic activities;Therapeutic exercise;Balance training;Patient/family education    PT Goals (Current goals can be found in the Care Plan section)  Acute Rehab PT Goals Patient Stated Goal: Return home and breathe better PT Goal Formulation: With patient Time For Goal Achievement: 04/16/18 Potential to Achieve Goals: Good    Frequency Min 3X/week   Barriers to discharge        Co-evaluation               AM-PAC PT "6 Clicks" Daily Activity   Outcome Measure Difficulty turning over in bed (including adjusting bedclothes, sheets and blankets)?: None Difficulty moving from lying on back to sitting on the side of the bed? : A Little Difficulty sitting down on and standing up from a chair with arms (e.g., wheelchair, bedside commode, etc,.)?: A Little Help needed moving to and from a bed to chair (including a wheelchair)?: A Little Help needed walking in hospital room?: A Little Help needed climbing 3-5 steps with a railing? : A Little 6 Click Score: 19    End of Session Equipment Utilized During Treatment: Gait belt Activity Tolerance: Patient tolerated treatment well;Patient limited by fatigue Patient left: in bed;with call bell/phone within reach;with family/visitor present Nurse Communication: Mobility status PT Visit Diagnosis: Other abnormalities of gait and mobility (R26.89)    Time: 0867-6195 PT Time Calculation (min) (ACUTE ONLY): 24 min   Charges:   PT Evaluation $PT Eval Moderate Complexity: 1 Mod PT Treatments $Therapeutic Activity: 8-22 mins   PT G Codes:       Mabeline Caras, PT, DPT Acute Rehab Services  Pager: [place] 04/02/2018, 2:53 PM

## 2018-04-02 NOTE — Progress Notes (Signed)
PROGRESS NOTE    Kimberly Waters  PIR:[id number] DOB: 1946/12/04 DOA: 04/01/2018 PCP: Josetta Huddle, MD   Outpatient Specialists:     Brief Narrative:  Kimberly Waters is a 72 y.o. female with medical history significant of hypothyroidism and breast cancer presenting with SOB.   The patient reports that 3 weeks ago, it all started with a viral respiratory illness.    She went to urgent care [facility] walk-in clinic), got better but still with intermittent SOB.  This past week, allergies started kicking in and getting worse and worse.  Her daughter reports 3+ weeks of SOB, wheezing.  Saw UC - was given a z-pack, developed thrush (still there).  Saw pulm last week, CT done and sent her for echo.  They called and said there was "fluid on her heart".  She was supposed to f/u on Wednesday.  She is ok at rest but her O2 level plumetted.  She would be extremely SOB.  When she sat back down, used MDI (which wasn't helping), symptoms would improve after about 5 minutes.  The same thing wqould happen again the next time.  Her sister called 53.  O2 fell to 87% after 2 steps when EMS was there.  She stays 24/7 with her mom every other week.  O2 sats 92-93% sitting down at her mom's house.  No cough - she did initially but none recently.  She quit smoking 1 year ago.  No fevers.  +severe depression, fatigue, not eating well, lifeless since breast cancer over a year ago.  She was supposed to see Dr. Inda Merlin today "for him to up my nice pills."  Eric Form switched inhalers and said to give it at least a month.  No chest pain.  In the back of her mind, she thinks cancer is somewhere else in her body and she worries about that.  She was last seen by Dr. Jana Hakim in 10/18 for T2 N0 invasive ductal carcinoma.  She had B mastectomies; had negative margins on the right with no cancer on the left; completed adjuvant radiation; and is planned to be on tamoxifen for 5 years.    The note from Dr. Chase Caller from 3/15 indicates that  the cough/wheezing could have been coming from Lisinopril and so this was stopped.  He suspected that her symptoms were more related to obesity and diastolic dysfunction with COPD but since she is at increased risk for interstitial disease a CT was done (negative for ILD).  She was started on Incruse.  She followed up with Eric Form on 4/4.  The Incruse did not improve her symptoms and so she was changed to Symbicort and Spiriva.  Echo was ordered and she was referred to cardiology.      Assessment & Plan:   Active Problems:   Acute exacerbation of CHF (congestive heart failure) (HCC)   Acute respiratory failure -As noted in Dr. Golden Pop note on 3/15, this appears to be a multifactorial process consisting of diastolic heart failure exacerbation superimposed on underlying COPD -She also appears to have mildly worsening anemia -responding well to diuresis -wean O2 to off  Hypokalemia -replete  Acute Diastolic CHF exacerbation -Patient with progressive and worsening SOB and hypoxia despite recent antibiotics(Z-pack) -CXR consistent with pulmonary edema -Echocardiogram last week confirms grade 1 diastolic heart failure -Will start ASA -Will start Lisinopril 2.5 mg daily (this medication was stopped by pulm last month because it was thought to be a culprit of symptoms; will resume at very  low dose now due to CHF) -Continue beta blocker -lasix IV BID-- down 2L -daily BMP  COPD Underlying COPD without clear exacerbation -Patient's shortness of breath is also likely related to underlying COPD. -While she did not have a known diagnosis of COPD prior, imaging and history indicate that she does have this issue.  -She does not have fever or leukocytosis. Chest x-ray is not consistent with pneumonia -She was started on inhaled bronchodilators and steroids with pulm clinic but for now will use scheduled Duoneb and prn albuterol -5 days of steroids  Anemia -Baseline Hgb appears to  be in 11-12 range; current Hgb is 9.9 -This may be related to her progressive SOB -Will check stool guaiac  -This is a normocytic anemia and so is likely due to chronic disease if she does not have acute blood loss  Depression/Anxiety -Patient with chronic underlying depression and clear anxiety -Significant life stressors - very upset about her 82yo mother with dementia.  She and her sister have been alternating week on/week off and are both suffering both mentally and physically. -While she understands that medication will not help the life stressors, she would like to try to help herself to cope better by increasing her Lexapro. -Dose increased to 10 mg PO daily.      DVT prophylaxis:  Lovenox   Code Status: Full Code   Family Communication:   Disposition Plan:     Consultants:    Subjective: Breathing better but not back to her baseline--- has not felt "well" in 2 years since her mastectomy   Objective: Vitals:   04/02/18 0742 04/02/18 0806 04/02/18 1148 04/02/18 1225  BP:  (!) 146/73 140/82   Pulse:  80 77   Resp:   18   Temp:   98.3 F (36.8 C)   TempSrc:   Oral   SpO2: 98% 91% 94% 92%  Weight:      Height:        Intake/Output Summary (Last 24 hours) at 04/02/2018 1400 Last data filed at 04/02/2018 1300 Gross per 24 hour  Intake 606 ml  Output 1250 ml  Net -644 ml   Filed Weights   04/01/18 1842 04/02/18 0651  Weight: 89.8 kg (198 lb) 88.6 kg (195 lb 6.4 oz)    Examination:  General exam: Appears calm and comfortable- 1L  Respiratory system: diminished, few crackles Cardiovascular system: S1 & S2 heard, RRR. No JVD, murmurs, rubs, gallops or clicks. Lower ext edmea Gastrointestinal system: Abdomen is nondistended, soft and nontender. No organomegaly or masses felt. Normal bowel sounds heard. Central nervous system: Alert and oriented. No focal neurological deficits. Extremities: Symmetric 5 x 5 power. Skin: No rashes, lesions or  ulcers Psychiatry: anxious, pressured speech    Data Reviewed: I have personally reviewed following labs and imaging studies  CBC: Recent Labs  Lab 04/01/18 1010 04/01/18 2214  WBC 5.9 7.0  NEUTROABS 4.8 6.4  HGB 9.9* 11.1*  HCT 31.8* 34.8*  MCV 92.4 91.6  PLT 282 409   Basic Metabolic Panel: Recent Labs  Lab 03/29/18 1416 04/01/18 1010 04/02/18 0546  NA 142 142 138  K 3.7 3.5 3.2*  CL 106 107 102  CO2 26 24 25   GLUCOSE 96 98 98  BUN 15 14 14   CREATININE 1.09 1.01* 1.06*  CALCIUM 8.9 8.8* 8.8*   GFR: Estimated Creatinine Clearance: 51.1 mL/min (A) (by C-G formula based on SCr of 1.06 mg/dL (H)). Liver Function Tests: Recent Labs  Lab 04/01/18 1010  AST 32  ALT 52  ALKPHOS 72  BILITOT 0.5  PROT 6.6  ALBUMIN 3.3*   No results for input(s): LIPASE, AMYLASE in the last 168 hours. No results for input(s): AMMONIA in the last 168 hours. Coagulation Profile: No results for input(s): INR, PROTIME in the last 168 hours. Cardiac Enzymes: No results for input(s): CKTOTAL, CKMB, CKMBINDEX, TROPONINI in the last 168 hours. BNP (last 3 results) Recent Labs    03/29/18 1416  PROBNP 221.0*   HbA1C: No results for input(s): HGBA1C in the last 72 hours. CBG: No results for input(s): GLUCAP in the last 168 hours. Lipid Profile: No results for input(s): CHOL, HDL, LDLCALC, TRIG, CHOLHDL, LDLDIRECT in the last 72 hours. Thyroid Function Tests: Recent Labs    04/01/18 1620  TSH 3.977   Anemia Panel: No results for input(s): VITAMINB12, FOLATE, FERRITIN, TIBC, IRON, RETICCTPCT in the last 72 hours. Urine analysis: No results found for: COLORURINE, APPEARANCEUR, LABSPEC, [place], GLUCOSEU, HGBUR, BILIRUBINUR, KETONESUR, PROTEINUR, UROBILINOGEN, NITRITE, LEUKOCYTESUR   )No results found for this or any previous visit (from the past 240 hour(s)).    Anti-infectives (From admission, onward)   None       Radiology Studies: Dg Chest 2 View  Result Date:  04/01/2018 CLINICAL DATA:  Exertional dyspnea for the past several weeks. Hypoxia with exertion. Possible CHF. History of breast malignancy status post right mastectomy., former smoker. EXAM: CHEST - 2 VIEW COMPARISON:  Chest CT scan of March 26, 2018 FINDINGS: The lungs are adequately inflated. There is increased density at the left lung base compatible with pleural fluid layering posteriorly and laterally. No left effusion is observed. The cardiac silhouette is mildly enlarged. The pulmonary vascularity is mildly prominent centrally. There is calcification in the wall of the aortic arch. The trachea is midline. There is mild multilevel degenerative disc space narrowing of the thoracic spine. IMPRESSION: Mild CHF superimposed upon chronic bronchitic changes. Small right pleural effusion. Thoracic aortic atherosclerosis. Electronically Signed   By: David  Martinique M.D.   On: 04/01/2018 09:44        Scheduled Meds: . anastrozole  1 mg Oral Daily  . aspirin EC  81 mg Oral Daily  . enoxaparin (LOVENOX) injection  40 mg Subcutaneous Q24H  . escitalopram  10 mg Oral Daily  . furosemide  20 mg Intravenous Q12H  . imipramine  200 mg Oral QHS  . ipratropium  2 spray Nasal QID  . ipratropium-albuterol  3 mL Nebulization QID  . levothyroxine  112 mcg Oral QAC breakfast  . lisinopril  2.5 mg Oral Daily  . metoprolol succinate  25 mg Oral Daily  . nystatin  5 mL Oral QID  . pantoprazole  40 mg Oral Daily  . predniSONE  40 mg Oral Q breakfast  . simvastatin  20 mg Oral QHS  . sodium chloride flush  3 mL Intravenous Q12H   Continuous Infusions: . sodium chloride       LOS: 0 days    Time spent: 35 min    Geradine Girt, DO [facility] Pager [phone]  If 7PM-7AM, please contact night-coverage [email or web address] Password [email or web address] 04/02/2018, 2:00 PM

## 2018-04-02 NOTE — Progress Notes (Signed)
Occupational Therapy Note  OT eval completed.  Full write up to follow.  Lucille Passy, OTR/L [phone]

## 2018-04-02 NOTE — Clinical Social Work Note (Signed)
CSW acknowledges COPD GOLD consult. This is patient's first admission in 6 months. Patient would have to be admitted at least 3 times in 6 months to meet COPD GOLD protocol.  CSW signing off. Consult again if any other social work needs arise.  Dayton Scrape, [place]

## 2018-04-02 NOTE — Progress Notes (Addendum)
Offered assistance with bath.

## 2018-04-02 NOTE — Progress Notes (Signed)
Initial Nutrition Assessment  DOCUMENTATION CODES:   Obesity unspecified  INTERVENTION:  RD to monitor PO intake, pt declined supplement  NUTRITION DIAGNOSIS:   Increased nutrient needs related to chronic illness as evidenced by estimated needs.  GOAL:   Patient will meet greater than or equal to 90% of their needs  MONITOR:   PO intake, Labs, I & O's, Weight trends  REASON FOR ASSESSMENT:   Consult Assessment of nutrition requirement/status  ASSESSMENT:   72 y.o. F admitted for SOB due to acute exacerbation of CHF with PMH of COPD, colon polyps, dyspnea, hypothyroidism, breast cancer treated with radiation therapy. Previous tobacco use: quit date 02/22/17.   Spoke with pt regarding current eating and eating PTA with family at bedside. Pt reports that 2 weeks PTA she was eating half of her meals due to decreased appetite, but now her appetite is back up, she reports eating all of her breakfast and lunch and was talking about ordering dinner soon. Typically she will eat eggs with sausage for breakfast, a sandwich for lunch, and meat, starch, and a vegetable for dinner.   Pt declined supplementation.   Medications reviewed: Lasix, levothyroxine, protonix, and deltasone.  Labs reviewed: K+ 3.2 (L), creatinine 1.06 (H), GFR 52 (L). 04/01/18: RBC 3.8 (L), hemoglobin 11.1 (L), HCT 34.8 (L).    Intake/Output Summary (Last 24 hours) at 04/02/2018 1600 Last data filed at 04/02/2018 1300 Gross per 24 hour  Intake 606 ml  Output 1825 ml  Net -1219 ml    NUTRITION - FOCUSED PHYSICAL EXAM:    Most Recent Value  Orbital Region  No depletion  Upper Arm Region  No depletion  Thoracic and Lumbar Region  No depletion  Buccal Region  No depletion  Temple Region  No depletion  Clavicle Bone Region  No depletion  Clavicle and Acromion Bone Region  No depletion  Scapular Bone Region  Unable to assess  Dorsal Hand  No depletion  Patellar Region  No depletion  Anterior Thigh Region  No  depletion  Posterior Calf Region  No depletion  Edema (RD Assessment)  None  Hair  Reviewed  Eyes  Reviewed  Mouth  Reviewed  Skin  Reviewed  Nails  Reviewed       Diet Order:  Diet Heart Room service appropriate? Yes; Fluid consistency: Thin  EDUCATION NEEDS:   Education needs have been addressed  Skin:  Skin Assessment: Skin Integrity Issues: Skin Integrity Issues:: Other (Comment) Other: Abrasion R/L legs, moisture associated skin damage lower abdomen.   Last BM:  04/01/18  Height:   Ht Readings from Last 1 Encounters:  04/01/18 5\' 2"  (1.575 m)    Weight:   Wt Readings from Last 1 Encounters:  04/02/18 195 lb 6.4 oz (88.6 kg)   UBW: 191-201 lbs %UBW: 100%  Ideal Body Weight:  50 kg  BMI:  Body mass index is 35.74 kg/m.  Estimated Nutritional Needs:   Kcal:  1500-1700 kcal  Protein:  65-75 grams  Fluid:  1.5-1.7 L  Hope Budds, Dietetic Intern

## 2018-04-02 NOTE — Care Management Note (Signed)
Case Management Note  Patient Details  Name: Kimberly Waters MRN: [id number] Date of Birth: November 08, 1946  Subjective/Objective:  CHF                 Action/Plan: Patient lives at home and takes care of he mother; PCP is Dr Geanie Logan; has private insurance with Medicare / [employer]; pharmacy of choice [facility]; Patient/ daughter chose [facility] for [name]; [name] with [facility] called for arrangements; no DME in the home at this time; she has not smoked in 1 yr after diagnosed with Breast cancer and no DME at home; CM will continue to follow for progression of care.  Expected Discharge Date:  Possibly 04/03/2018            Expected Discharge Plan:  [place]  In-House Referral:   [facility]  Discharge planning Services  CM Consult  Choice offered to:  Patient, Adult Children  HH Arranged:  RN, Disease Management, PT [place] Agency:  [facility]  Status of Service:  In process, will continue to follow  Sherrilyn Rist [phone] 04/02/2018, 12:02 PM

## 2018-04-03 ENCOUNTER — Ambulatory Visit: Payer: Medicare Other | Admitting: Interventional Cardiology

## 2018-04-03 LAB — BASIC METABOLIC PANEL
ANION GAP: 12 (ref 5–15)
BUN: 22 mg/dL — ABNORMAL HIGH (ref 6–20)
CALCIUM: 8.9 mg/dL (ref 8.9–10.3)
CO2: 25 mmol/L (ref 22–32)
Chloride: 103 mmol/L (ref 101–111)
Creatinine, Ser: 1.14 mg/dL — ABNORMAL HIGH (ref 0.44–1.00)
GFR, EST AFRICAN AMERICAN: 55 mL/min — AB (ref >60)
GFR, EST NON AFRICAN AMERICAN: 48 mL/min — AB (ref >60)
GLUCOSE: 71 mg/dL (ref 65–99)
Potassium: 3.2 mmol/L — ABNORMAL LOW (ref 3.5–5.1)
Sodium: 140 mmol/L (ref 135–145)

## 2018-04-03 LAB — CBC
HEMATOCRIT: 35.1 % — AB (ref 36.0–46.0)
HEMOGLOBIN: 11.2 g/dL — AB (ref 12.0–15.0)
MCH: 29.5 pg (ref 26.0–34.0)
MCHC: 31.9 g/dL (ref 30.0–36.0)
MCV: 92.4 fL (ref 78.0–100.0)
PLATELETS: 322 10*3/uL (ref 150–400)
RBC: 3.8 MIL/uL — ABNORMAL LOW (ref 3.87–5.11)
RDW: 14.7 % (ref 11.5–15.5)
WBC: 7.9 10*3/uL (ref 4.0–10.5)

## 2018-04-03 MED ORDER — FUROSEMIDE 40 MG PO TABS
40.0000 mg | ORAL_TABLET | Freq: Every day | ORAL | Status: DC
  Administered 2018-04-04: 40 mg via ORAL
  Filled 2018-04-03: qty 1

## 2018-04-03 MED ORDER — FUROSEMIDE 10 MG/ML IJ SOLN
20.0000 mg | Freq: Once | INTRAMUSCULAR | Status: AC
  Administered 2018-04-03: 20 mg via INTRAVENOUS
  Filled 2018-04-03: qty 2

## 2018-04-03 MED ORDER — IPRATROPIUM-ALBUTEROL 0.5-2.5 (3) MG/3ML IN SOLN
3.0000 mL | Freq: Three times a day (TID) | RESPIRATORY_TRACT | Status: DC
  Administered 2018-04-03 (×2): 3 mL via RESPIRATORY_TRACT
  Filled 2018-04-03 (×2): qty 3

## 2018-04-03 MED ORDER — BIOTENE DRY MOUTH MT LIQD
15.0000 mL | OROMUCOSAL | Status: DC | PRN
  Filled 2018-04-03: qty 15

## 2018-04-03 MED ORDER — POTASSIUM CHLORIDE CRYS ER 20 MEQ PO TBCR
20.0000 meq | EXTENDED_RELEASE_TABLET | Freq: Every day | ORAL | Status: DC
Start: 2018-04-04 — End: 2018-04-04
  Administered 2018-04-04: 20 meq via ORAL
  Filled 2018-04-03: qty 1

## 2018-04-03 MED ORDER — POTASSIUM CHLORIDE CRYS ER 20 MEQ PO TBCR
40.0000 meq | EXTENDED_RELEASE_TABLET | Freq: Once | ORAL | Status: AC
  Administered 2018-04-03: 40 meq via ORAL
  Filled 2018-04-03: qty 2

## 2018-04-03 NOTE — Progress Notes (Signed)
PROGRESS NOTE    Kimberly Waters  [id number]:[id number] DOB: February 04, 1946 DOA: 04/01/2018 PCP: Josetta Huddle, MD   Outpatient Specialists:     Brief Narrative:  Kimberly Waters is a 72 y.o. female with medical history significant of hypothyroidism and breast cancer presenting with SOB.   The patient reports that 3 weeks ago, it all started with a viral respiratory illness.    She went to urgent care [facility] walk-in clinic), got better but still with intermittent SOB.  This past week, allergies started kicking in and getting worse and worse.  Her daughter reports 3+ weeks of SOB, wheezing.  Saw UC - was given a z-pack, developed thrush (still there).  Saw pulm last week, CT done and sent her for echo.  They called and said there was "fluid on her heart".  She was supposed to f/u on Wednesday.  She is ok at rest but her O2 level plumetted.  She would be extremely SOB.  When she sat back down, used MDI (which wasn't helping), symptoms would improve after about 5 minutes.  The same thing wqould happen again the next time.  Her sister called 92.  O2 fell to 87% after 2 steps when EMS was there.  She stays 24/7 with her mom every other week.  O2 sats 92-93% sitting down at her mom's house.  No cough - she did initially but none recently.  She quit smoking 1 year ago.  No fevers.  +Kimberly Waters, fatigue, not eating well, lifeless since breast cancer over a year ago.  She was supposed to see Kimberly Waters today "for him to up my nice pills."  Kimberly Waters switched inhalers and said to give it at least a month.  No chest pain.  In the back of her mind, she thinks cancer is somewhere else in her body and she worries about that.  She was last seen by Kimberly Waters in 10/18 for T2 N0 invasive ductal carcinoma.  She had B mastectomies; had negative margins on the right with no cancer on the left; completed adjuvant radiation; and is planned to be on tamoxifen for 5 years.    The note from Kimberly Waters from 3/15 indicates that  the cough/wheezing could have been coming from Lisinopril and so this was stopped.  He suspected that her symptoms were more related to obesity and diastolic dysfunction with COPD but since she is at increased risk for interstitial disease a CT was done (negative for ILD).  She was started on Incruse.  She followed up with Kimberly Waters on 4/4.  The Incruse did not improve her symptoms and so she was changed to Symbicort and Spiriva.  Echo was ordered and she was referred to cardiology.      Assessment & Plan:   Active Problems:   Acute exacerbation of CHF (congestive heart failure) (HCC)   Acute respiratory failure -As noted in Kimberly Waters note on 3/15, this appears to be a multifactorial process consisting of diastolic heart failure exacerbation superimposed on underlying COPD/obesity -She also appears to have mildly worsening anemia -responding well to diuresis -wean O2 to off  Hypokalemia -repleted  Acute Diastolic CHF exacerbation -Patient with progressive and worsening SOB and hypoxia despite recent antibiotics(Z-pack) -CXR consistent with pulmonary edema -Echocardiogram last week confirms grade 1 diastolic heart failure -Will start ASA -Will start Lisinopril 2.5 mg daily (this medication was stopped by pulm last month because it was thought to be a culprit of symptoms; will resume at very  low dose now due to CHF) -Continue beta blocker -lasix IV BID-- down 4L -daily BMP -appointment made with Dr. Miachel Roux   COPD Underlying COPD without clear exacerbation -Patient's shortness of breath is also likely related to underlying COPD. -While she did not have a known diagnosis of COPD prior, imaging and history indicate that she does have this issue.  -She does not have fever or leukocytosis. Chest x-ray is not consistent with pneumonia -She was started on inhaled bronchodilators and steroids with pulm clinic but for now will use scheduled Duoneb and prn albuterol -5 days of  steroids  Anemia -Baseline Hgb appears to be in 11-12 range; current Hgb is 9.9 -This is a normocytic anemia and so is likely due to chronic disease if she does not have acute blood loss  Waters/Anxiety -Patient with chronic underlying Waters and clear anxiety -Significant life stressors - very upset about her 59yo mother with dementia.  She and her sister have been alternating week on/week off and are both suffering both mentally and physically. -While she understands that medication will not help the life stressors, she would like to try to help herself to cope better by increasing her Lexapro. -Dose increased to 10 mg PO daily by admitter   Obesity Body mass index is 35.37 kg/m.    DVT prophylaxis:  Lovenox   Code Status: Full Code   Family Communication: At bedside  Disposition Plan:  Home in AM   Consultants:    Subjective: Still subjectively very SOB with exertion but now off O2   Objective: Vitals:   04/03/18 0807 04/03/18 0829 04/03/18 1110 04/03/18 1344  BP:  (!) 143/82    Pulse:  68    Resp:      Temp:      TempSrc:      SpO2: 98% 93% 94% 97%  Weight:      Height:        Intake/Output Summary (Last 24 hours) at 04/03/2018 1436 Last data filed at 04/03/2018 0845 Gross per 24 hour  Intake 363 ml  Output 2100 ml  Net -1737 ml   Filed Weights   04/01/18 1842 04/02/18 0651 04/03/18 0645  Weight: 89.8 kg (198 lb) 88.6 kg (195 lb 6.4 oz) 87.7 kg (193 lb 6.4 oz)    Examination:  General exam: off O2- increased work of breathing when up walking Respiratory system: crackles on right base, no wheezing Cardiovascular system: rrr Gastrointestinal system: +Bs, soft, obese Central nervous system: alert impulsive    Data Reviewed: I have personally reviewed following labs and imaging studies  CBC: Recent Labs  Lab 04/01/18 1010 04/01/18 2214 04/03/18 0801  WBC 5.9 7.0 7.9  NEUTROABS 4.8 6.4  --   HGB 9.9* 11.1* 11.2*  HCT  31.8* 34.8* 35.1*  MCV 92.4 91.6 92.4  PLT 282 316 539   Basic Metabolic Panel: Recent Labs  Lab 03/29/18 1416 04/01/18 1010 04/02/18 0546 04/03/18 0801  NA 142 142 138 140  K 3.7 3.5 3.2* 3.2*  CL 106 107 102 103  CO2 26 24 25 25   GLUCOSE 96 98 98 71  BUN 15 14 14  22*  CREATININE 1.09 1.01* 1.06* 1.14*  CALCIUM 8.9 8.8* 8.8* 8.9   GFR: Estimated Creatinine Clearance: 47.2 mL/min (A) (by C-G formula based on SCr of 1.14 mg/dL (H)). Liver Function Tests: Recent Labs  Lab 04/01/18 1010  AST 32  ALT 52  ALKPHOS 72  BILITOT 0.5  PROT 6.6  ALBUMIN 3.3*  No results for input(s): LIPASE, AMYLASE in the last 168 hours. No results for input(s): AMMONIA in the last 168 hours. Coagulation Profile: No results for input(s): INR, PROTIME in the last 168 hours. Cardiac Enzymes: No results for input(s): CKTOTAL, CKMB, CKMBINDEX, TROPONINI in the last 168 hours. BNP (last 3 results) Recent Labs    03/29/18 1416  PROBNP 221.0*   HbA1C: No results for input(s): HGBA1C in the last 72 hours. CBG: No results for input(s): GLUCAP in the last 168 hours. Lipid Profile: No results for input(s): CHOL, HDL, LDLCALC, TRIG, CHOLHDL, LDLDIRECT in the last 72 hours. Thyroid Function Tests: Recent Labs    04/01/18 1620  TSH 3.977   Anemia Panel: No results for input(s): VITAMINB12, FOLATE, FERRITIN, TIBC, IRON, RETICCTPCT in the last 72 hours. Urine analysis: No results found for: COLORURINE, APPEARANCEUR, LABSPEC, [place], GLUCOSEU, HGBUR, BILIRUBINUR, KETONESUR, PROTEINUR, UROBILINOGEN, NITRITE, LEUKOCYTESUR   )No results found for this or any previous visit (from the past 240 hour(s)).    Anti-infectives (From admission, onward)   None       Radiology Studies: No results found.      Scheduled Meds: . anastrozole  1 mg Oral Daily  . aspirin EC  81 mg Oral Daily  . enoxaparin (LOVENOX) injection  40 mg Subcutaneous Q24H  . escitalopram  10 mg Oral Daily  .  furosemide  20 mg Intravenous Once  . [START ON 04/04/2018] furosemide  40 mg Oral Daily  . imipramine  200 mg Oral QHS  . ipratropium  2 spray Nasal QID  . ipratropium-albuterol  3 mL Nebulization TID  . levothyroxine  112 mcg Oral QAC breakfast  . lisinopril  2.5 mg Oral Daily  . metoprolol succinate  25 mg Oral Daily  . nystatin  5 mL Oral QID  . pantoprazole  40 mg Oral Daily  . [START ON 04/04/2018] potassium chloride  20 mEq Oral Daily  . predniSONE  40 mg Oral Q breakfast  . simvastatin  20 mg Oral QHS  . sodium chloride flush  3 mL Intravenous Q12H   Continuous Infusions: . sodium chloride       LOS: 1 day    Time spent: 25 min    Geradine Girt, DO [facility] Pager [phone]  If 7PM-7AM, please contact night-coverage [email or web address] Password [email or web address] 04/03/2018, 2:36 PM

## 2018-04-03 NOTE — Progress Notes (Signed)
Occupational Therapy Evaluation Late Entry  Pt presents to OT with the below listed deficits.  She requires min guard assist with ADLs, and demonstrates DOE 3/4 and 02 sats 90-05% on 2L supplemental 02.  Pt is impulsive and demonstrates poor safety awareness and poor ability to self monitor that places her at high risk for falls and injury.  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   Feel she would benefit from [facility] initially with transition to pulmonary rehab.  Will follow acutely.   Recommend shower seat at discharge.     04/02/18 1600  OT Visit Information  Last OT Received On 04/03/18  Assistance Needed +1  History of Present Illness Pt is a 72 y.o. female admitted 04/01/18 with CHF exacerbation. PMH includes HTN, breast CA, depression.   Precautions  Precautions Fall  Home Living  Family/patient expects to be discharged to: Private residence  Living Arrangements Spouse/significant other  Available Help at Discharge Family;Available 24 hours/day  Type of Home House  Home Access Stairs to enter  Entrance Stairs-Number of Steps 5  Entrance Stairs-Rails Right;Left  Home Layout Multi-level;Laundry or work area in basement;Able to live on main level with bedroom/bathroom  Alternate Therapist, sports of Steps full flight   Alternate Level Stairs-Rails Right;Left  Barrister's clerk Grab bars - tub/shower;Wheelchair - manual  Prior Function  Level of Independence Needs assistance  Gait / Transfers Assistance Needed Reports indep with no DME. Has had 2 falls in past 6 months (one due to LOB, one due to "blacking out"). Reports only able to amb room-to-room before significant DOE  ADL's / Century with ADLs. Requires assist for household tasks and heavier IADLs due to DOE. 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  Comments Daughter reports pt typically sedentary and primarily  sits/lays in bed  Communication  Communication No difficulties;HOH  Pain Assessment  Pain Assessment No/denies pain  Cognition  Arousal/Alertness Awake/alert  Behavior During Therapy Anxious  Overall Cognitive Status Impaired/Different from baseline  Area of Impairment Attention;Safety/judgement;Problem solving  Current Attention Level Selective  Following Commands Follows multi-step commands inconsistently;Follows multi-step commands with increased time (self distracts with anxiety )  Safety/Judgement Decreased awareness of safety;Decreased awareness of deficits  Problem Solving Requires verbal cues;Requires tactile cues  General Comments Pt is impulsive.  She was standing in room, moving about, complaining about SOB - 02 not hooked to wall.   Upon return to room, pt had removed tele.  Despite cues for pacing and self monitoring, pt continues as she was with no awareness of level of dyspnea and unsteadiness ensuing   Upper Extremity Assessment  Upper Extremity Assessment Overall WFL for tasks assessed  Lower Extremity Assessment  Lower Extremity Assessment Defer to PT evaluation  Cervical / Trunk Assessment  Cervical / Trunk Assessment Normal  ADL  Overall ADL's  Needs assistance/impaired  Eating/Feeding Independent  Grooming Wash/dry hands;Wash/dry face;Oral care;Brushing hair;Min guard;Standing  Grooming Details (indicate cue type and reason) becomes mildly unsteady as she fatigues   Upper Body Bathing Set up;Sitting  Lower Body Bathing Min guard;Sit to/from stand  Upper Body Dressing  Set up;Sitting  Lower Body Dressing Min guard;Sit to/from Environmental education officer guard;Ambulation;Comfort height toilet;Grab bars  Toileting- Water quality scientist and Hygiene Min guard;Sit to/from stand  Functional mobility during ADLs Min guard  General ADL Comments Pt requires min guard to steady   Bed Mobility  Overal bed  mobility Modified Independent  General bed mobility comments  Increased time; no physical assist required  Transfers  Overall transfer level Needs assistance  Equipment used None  Transfers Sit to/from Stand;Stand Pivot Transfers  Sit to Stand Supervision  Stand pivot transfers Min guard  General transfer comment min guard for safety.  Becomes unsteady as she fatigues   Balance  Overall balance assessment Needs assistance  Sitting-balance support Feet supported  Sitting balance-Leahy Scale Good  Standing balance support No upper extremity supported;During functional activity  Standing balance-Leahy Scale Fair  Standing balance comment min guard for static standing.  Did loose balance posteriorly when fatigued   General Comments  General comments (skin integrity, edema, etc.) 02 sats 90-94% on 2L supplemental 02.  DOE 3/4.  Discussed pacing and self monitoring with pt and daughter   Exercises  Exercises Other exercises  Other Exercises  Other Exercises Pt instructed in pursed lip breathing.  She was instructed to perform controlled breathing exercises 3x/day for 3-5 mins/day   OT - End of Session  Equipment Utilized During Treatment Oxygen  Activity Tolerance Patient limited by fatigue  Patient left in bed;with call bell/phone within reach;with family/visitor present  Nurse Communication Mobility status  OT Assessment  OT Recommendation/Assessment Patient needs continued OT Services  OT Visit Diagnosis Unsteadiness on feet (R26.81)  OT Problem List Decreased strength;Decreased activity tolerance;Impaired balance (sitting and/or standing);Decreased safety awareness;Cardiopulmonary status limiting activity  OT Plan  OT Frequency (ACUTE ONLY) Min 2X/week  OT Treatment/Interventions (ACUTE ONLY) Self-care/ADL training;Therapeutic exercise;Energy conservation;DME and/or AE instruction;Therapeutic activities;Patient/family education;Balance training  AM-PAC OT "6 Clicks" Daily Activity Outcome Measure  Help from another person eating meals? 4  Help  from another person taking care of personal grooming? 3  Help from another person toileting, which includes using toliet, bedpan, or urinal? 3  Help from another person bathing (including washing, rinsing, drying)? 3  Help from another person to put on and taking off regular upper body clothing? 3  Help from another person to put on and taking off regular lower body clothing? 3  6 Click Score 19  ADL G Code Conversion CK  OT Recommendation  Follow Up Recommendations Home health OT;Other (comment) (with transition to pulmonary rehab )  OT Equipment Tub/shower seat  Individuals Consulted  Consulted and Agree with Results and Recommendations Patient;Family member/caregiver  Family Member Consulted daughter   Acute Rehab OT Goals  Patient Stated Goal get stronger   OT Goal Formulation With patient/family  Time For Goal Achievement 04/17/18  Potential to Achieve Goals Good  OT Time Calculation  OT Start Time (ACUTE ONLY) 1053  OT Stop Time (ACUTE ONLY) 1148  OT Time Calculation (min) 55 min  OT General Charges  $OT Visit 1 Visit  OT Evaluation  $OT Eval Moderate Complexity 1 Mod  OT Treatments  $Self Care/Home Management  38-52 mins   [facility], OTR/L [phone]

## 2018-04-03 NOTE — Progress Notes (Signed)
Patient walked with physical therapy in the hallway. The lowest her oxygen dropped was 90% on room air.

## 2018-04-03 NOTE — Plan of Care (Signed)
  Problem: Activity: Goal: Risk for activity intolerance will decrease Outcome: Progressing   Problem: Safety: Goal: Ability to remain free from injury will improve Outcome: Progressing   

## 2018-04-03 NOTE — Progress Notes (Addendum)
Physical Therapy Treatment Patient Details Name: Kimberly Waters MRN: [id number] DOB: 1946-05-24 Today's Date: 04/03/2018    History of Present Illness Pt is a 72 y.o. female admitted 04/01/18 with CHF exacerbation. PMH includes HTN, breast CA, depression.    PT Comments    Initiated gait training with rollator. Able to amb 200' with rollator and supervision for safety. 1x standing rest break secondary to DOE, improving with rest and deep breathing technique. Pt very pleased with added stability of rollator. Recommend its use for this and energy conservation.   SpO2 90-93% on RA while ambulating   Follow Up Recommendations  Home health PT;Supervision for mobility/OOB(progress to Outpatient Cardiopulmonary rehab)     Equipment Recommendations  (rollator)    Recommendations for Other Services       Precautions / Restrictions Precautions Precautions: Fall Restrictions Weight Bearing Restrictions: No    Mobility  Bed Mobility Overal bed mobility: Modified Independent             General bed mobility comments: Increased time; no physical assist required  Transfers Overall transfer level: Needs assistance Equipment used: None;4-wheeled walker Transfers: Sit to/from Stand Sit to Stand: Supervision         General transfer comment: Supervision standing from bed and toilet with no DME. Mod indep with rollator; good technique locking brakes  Ambulation/Gait Ambulation/Gait assistance: Supervision Ambulation Distance (Feet): 200 Feet Assistive device: 4-wheeled walker   Gait velocity: Decreased Gait velocity interpretation: Below normal speed for age/gender General Gait Details: Amb in room with min guard as pt reaching to furniture for support. Amb an additional 180' in hallway with rollator and supervision for safety. 1x standing rest break secondary to DOE. SpO2 90% on RA   Stairs            Wheelchair Mobility    Modified Rankin (Stroke Patients Only)        Balance Overall balance assessment: Needs assistance Sitting-balance support: Feet supported Sitting balance-Leahy Scale: Good     Standing balance support: No upper extremity supported;During functional activity Standing balance-Leahy Scale: Fair                              Cognition Arousal/Alertness: Awake/alert Behavior During Therapy: WFL for tasks assessed/performed Overall Cognitive Status: Impaired/Different from baseline Area of Impairment: Attention;Safety/judgement                   Current Attention Level: Selective     Safety/Judgement: Decreased awareness of safety;Decreased awareness of deficits     General Comments: Less anxious regarding breathing today      Exercises      General Comments        Pertinent Vitals/Pain Pain Assessment: No/denies pain    Home Living                      Prior Function            PT Goals (current goals can now be found in the care plan section) Acute Rehab PT Goals Patient Stated Goal: get stronger  PT Goal Formulation: With patient Time For Goal Achievement: 04/16/18 Potential to Achieve Goals: Good Progress towards PT goals: Progressing toward goals    Frequency    Min 3X/week      PT Plan Current plan remains appropriate    Co-evaluation              AM-PAC PT "  6 Clicks" Daily Activity  Outcome Measure  Difficulty turning over in bed (including adjusting bedclothes, sheets and blankets)?: None Difficulty moving from lying on back to sitting on the side of the bed? : A Little Difficulty sitting down on and standing up from a chair with arms (e.g., wheelchair, bedside commode, etc,.)?: A Little Help needed moving to and from a bed to chair (including a wheelchair)?: A Little Help needed walking in hospital room?: A Little Help needed climbing 3-5 steps with a railing? : A Little 6 Click Score: 19    End of Session Equipment Utilized During Treatment:  Gait belt Activity Tolerance: Patient tolerated treatment well Patient left: in bed;with call bell/phone within reach Nurse Communication: Mobility status PT Visit Diagnosis: Other abnormalities of gait and mobility (R26.89)     Time: 5035-4656 PT Time Calculation (min) (ACUTE ONLY): 21 min  Charges:  $Gait Training: 8-22 mins                    G Codes:      Mabeline Caras, PT, DPT Acute Rehab Services  Pager: [place] 04/03/2018, 12:32 PM

## 2018-04-04 DIAGNOSIS — F419 Anxiety disorder, unspecified: Secondary | ICD-10-CM

## 2018-04-04 LAB — BASIC METABOLIC PANEL
ANION GAP: 14 (ref 5–15)
BUN: 25 mg/dL — ABNORMAL HIGH (ref 6–20)
CALCIUM: 9 mg/dL (ref 8.9–10.3)
CO2: 24 mmol/L (ref 22–32)
Chloride: 99 mmol/L — ABNORMAL LOW (ref 101–111)
Creatinine, Ser: 1.16 mg/dL — ABNORMAL HIGH (ref 0.44–1.00)
GFR, EST AFRICAN AMERICAN: 54 mL/min — AB (ref >60)
GFR, EST NON AFRICAN AMERICAN: 47 mL/min — AB (ref >60)
Glucose, Bld: 78 mg/dL (ref 65–99)
POTASSIUM: 3.8 mmol/L (ref 3.5–5.1)
Sodium: 137 mmol/L (ref 135–145)

## 2018-04-04 LAB — CBC
HCT: 34.4 % — ABNORMAL LOW (ref 36.0–46.0)
HEMOGLOBIN: 11 g/dL — AB (ref 12.0–15.0)
MCH: 29.5 pg (ref 26.0–34.0)
MCHC: 32 g/dL (ref 30.0–36.0)
MCV: 92.2 fL (ref 78.0–100.0)
PLATELETS: 209 10*3/uL (ref 150–400)
RBC: 3.73 MIL/uL — AB (ref 3.87–5.11)
RDW: 14.6 % (ref 11.5–15.5)
WBC: 6.2 10*3/uL (ref 4.0–10.5)

## 2018-04-04 LAB — MAGNESIUM: Magnesium: 2.1 mg/dL (ref 1.7–2.4)

## 2018-04-04 MED ORDER — FUROSEMIDE 40 MG PO TABS: 40.0000 mg | ORAL_TABLET | Freq: Every day | ORAL | 0 refills | Status: DC

## 2018-04-04 MED ORDER — PREDNISONE 20 MG PO TABS: 40.0000 mg | ORAL_TABLET | Freq: Every day | ORAL | 0 refills | Status: DC

## 2018-04-04 MED ORDER — ESCITALOPRAM OXALATE 10 MG PO TABS: 10.0000 mg | ORAL_TABLET | Freq: Every day | ORAL | 0 refills | Status: DC

## 2018-04-04 MED ORDER — POTASSIUM CHLORIDE CRYS ER 20 MEQ PO TBCR: 20.0000 meq | EXTENDED_RELEASE_TABLET | Freq: Every day | ORAL | 0 refills | Status: DC

## 2018-04-04 MED ORDER — IPRATROPIUM-ALBUTEROL 0.5-2.5 (3) MG/3ML IN SOLN
3.0000 mL | Freq: Two times a day (BID) | RESPIRATORY_TRACT | Status: DC
  Administered 2018-04-04: 3 mL via RESPIRATORY_TRACT
  Filled 2018-04-04: qty 3

## 2018-04-04 MED ORDER — LISINOPRIL 2.5 MG PO TABS: 2.5000 mg | ORAL_TABLET | Freq: Every day | ORAL | 0 refills | Status: DC

## 2018-04-04 MED ORDER — ASPIRIN 81 MG PO TBEC: 81.0000 mg | DELAYED_RELEASE_TABLET | Freq: Every day | ORAL | Status: DC

## 2018-04-04 NOTE — Consult Note (Signed)
   [facility] CM Inpatient Consult   04/04/2018  Kimberly Waters [birth year] [id number]  Patient assessed and screened for high risk for unplanned re-admission [22%].  Met with the patient and her nieces at the bedside with patient's permission.  Explained [facility] Care Management program for post hospital follow up.  Patient's primary care provider, Josetta Huddle office is listed to provide the transition of care call and follow up. Inpatient RNCM had assigned to [facility] calls patient states she will be willing to have a few calls for follow up.  Patient was given a brochure, 24 hour nurse advise line and flyer for EMMI COPD for post hospital follow up needs.   For questions, please contact:  Natividad Brood, RN BSN [facility] Liaison  [phone] business mobile phone Toll free office [phone]

## 2018-04-04 NOTE — Discharge Summary (Signed)
Physician Discharge Summary  Kimberly Waters WVP:[id number] DOB: 1946-05-04 DOA: 04/01/2018  PCP: Kimberly Huddle, MD  Admit date: 04/01/2018 Discharge date: 04/04/2018   Recommendations for Outpatient Follow-Up:   1. BMP 1 week   Discharge Diagnosis:   Active Problems:   Acute exacerbation of CHF (congestive heart failure) Kimberly Waters)   Discharge disposition:  Home.  SNF:  Discharge Condition: Improved.  Diet recommendation: Low sodium, heart healthy  Wound care: None.   History of Present Illness:   Kimberly L Robertsis a 72 y.o.femalewith medical history significant ofhypothyroidism and breast cancer presenting with SOB.The patient reports that3 weeks ago, it all started with a viral respiratory illness. She went to urgent care [facility]), got better but still with intermittent SOB. This past week, allergies started kicking in and getting worse and worse. Her daughter reports 3+ weeks ofSOB, wheezing. Saw UC - was given a z-pack, developed thrush (still there). Saw pulm last week, CT done and sent her for echo. They called and said there was"fluid on her heart". She was supposed to f/u on Wednesday. She is ok at rest but her O2 level plumetted. She would be extremely SOB. When she sat back down, used MDI (which wasn't helping), symptoms would improve after about 5 minutes. The same thing wqould happen again the next time. Her sister called 46. O2 fell to 87% after 2 steps when EMS was there. She stays 24/7 with her mom every other week. O2 sats 92-93% sitting down at her mom's house. No cough - she did initially but none recently. She quit smoking 1 year ago. No fevers. +severe depression, fatigue, not eating well, lifeless since breast cancer over a year ago. She was supposed to see Dr. Inda Waters today "for him to up my nice pills." Kimberly Waters switched inhalers and said to give it at least a month. No chest pain. In the back of her mind, she thinks cancer  is somewhere else in her body and she worries about that.  She was last seen by Dr. Jana Waters in 10/18 for T2 N0 invasive ductal carcinoma. She had B mastectomies; had negative margins on the right with no cancer on the left; completed adjuvant radiation; and is planned to be on tamoxifen for 5 years.   The note from Dr. Chase Waters from 3/15 indicates that the cough/wheezing could have been coming from Lisinopril and so this was stopped. He suspected that her symptoms were more related to obesity and diastolic dysfunction with COPD but since she is at increased risk for interstitial disease a CT was done (negative for ILD). She was started on Incruse. She followed up with Kimberly Waters on 4/4. The Incruse did not improve her symptoms and so she was changed to Symbicort and Spiriva. Echo was ordered and she was referred to cardiology.     Hospital Course by Problem:   Acute respiratory failure -As noted in Dr. Golden Waters note on 3/15, this appears to be a multifactorial process consisting of diastolic heart failure exacerbation superimposed on underlying COPD/obesity as well as anxiety -responding well to diuresis -wean O2 to off  Hypokalemia -repleted  Acute Diastolic CHF exacerbation -Patient withprogressive andworsening SOB and hypoxia despite recent antibiotics(Z-pack) -CXR consistent with pulmonary edema -Echocardiogram last week confirms grade 1 diastolic heart failure -Will start ASA -Will start Lisinopril2.5 mg daily (this medication was stopped by pulm last month because it was thought to be a culprit of symptoms; will resume at very low dose now due to CHF) -  Continuebeta blocker -lasix IV BID-- down 4+L--- change to PO and close cardiology follow up -appointment made with Dr. Miachel Waters   COPD UnderlyingCOPD without clearexacerbation -Patient's shortness of breathis also likely related to underlying COPD. -While she did not have a known diagnosis of COPD  prior, imaging and history indicate that she does have this issue. -She does not have fever or leukocytosis. Chest x-ray is not consistent with pneumonia -She was started on inhaled bronchodilators and steroids with pulm clinic but for now will usescheduled Duoneb and prn albuterol -5 days of steroids  Anemia -Baseline Hgb appears to be in 11-12 range; current Hgb is 9.9 -This is a normocytic anemia and so is likely due to chronic disease if she does not have acute blood loss  Depression/Anxiety -Patient with chronic underlying depression and clear anxiety -Significant life stressors - very upset about her 70yo mother with dementia. She and her sister have been alternating week on/week off and are both suffering both mentally and physically. -While she understands that medication will not help the life stressors, she would like to try to help herself to cope better by increasing her Lexapro. -Dose increased to 10 mg PO daily by admitter   Obesity Body mass index is 35.37 kg/m.        Medical Consultants:    None.   Discharge Exam:   Vitals:   04/04/18 0800 04/04/18 0803  BP: 122/69   Pulse: 72   Resp:    Temp: 98 F (36.7 C)   SpO2: 96% 96%   Vitals:   04/03/18 2028 04/04/18 0506 04/04/18 0800 04/04/18 0803  BP:  (!) 153/81 122/69   Pulse:  70 72   Resp:  18    Temp:  98.2 F (36.8 C) 98 F (36.7 C)   TempSrc:  Oral Oral   SpO2: 95% 94% 96% 96%  Weight:  87.2 kg (192 lb 3.2 oz)    Height:        Gen:  NAD    The results of significant diagnostics from this hospitalization (including imaging, microbiology, ancillary and laboratory) are listed below for reference.     Procedures and Diagnostic Studies:   Dg Chest 2 View  Result Date: 04/01/2018 CLINICAL DATA:  Exertional dyspnea for the past several weeks. Hypoxia with exertion. Possible CHF. History of breast malignancy status post right mastectomy., former smoker. EXAM: CHEST - 2 VIEW  COMPARISON:  Chest CT scan of March 26, 2018 FINDINGS: The lungs are adequately inflated. There is increased density at the left lung base compatible with pleural fluid layering posteriorly and laterally. No left effusion is observed. The cardiac silhouette is mildly enlarged. The pulmonary vascularity is mildly prominent centrally. There is calcification in the wall of the aortic arch. The trachea is midline. There is mild multilevel degenerative disc space narrowing of the thoracic spine. IMPRESSION: Mild CHF superimposed upon chronic bronchitic changes. Small right pleural effusion. Thoracic aortic atherosclerosis. Electronically Signed   By: David  Martinique M.D.   On: 04/01/2018 09:44     Labs:   Basic Metabolic Panel: Recent Labs  Lab 03/29/18 1416 04/01/18 1010 04/02/18 0546 04/03/18 0801 04/04/18 0448  NA 142 142 138 140 137  K 3.7 3.5 3.2* 3.2* 3.8  CL 106 107 102 103 99*  CO2 26 24 25 25 24   GLUCOSE 96 98 98 71 78  BUN 15 14 14  22* 25*  CREATININE 1.09 1.01* 1.06* 1.14* 1.16*  CALCIUM 8.9 8.8* 8.8* 8.9 9.0  MG  --   --   --   --  2.1   GFR Estimated Creatinine Clearance: 46.2 mL/min (A) (by C-G formula based on SCr of 1.16 mg/dL (H)). Liver Function Tests: Recent Labs  Lab 04/01/18 1010  AST 32  ALT 52  ALKPHOS 72  BILITOT 0.5  PROT 6.6  ALBUMIN 3.3*   No results for input(s): LIPASE, AMYLASE in the last 168 hours. No results for input(s): AMMONIA in the last 168 hours. Coagulation profile No results for input(s): INR, PROTIME in the last 168 hours.  CBC: Recent Labs  Lab 04/01/18 1010 04/01/18 2214 04/03/18 0801 04/04/18 0448  WBC 5.9 7.0 7.9 6.2  NEUTROABS 4.8 6.4  --   --   HGB 9.9* 11.1* 11.2* 11.0*  HCT 31.8* 34.8* 35.1* 34.4*  MCV 92.4 91.6 92.4 92.2  PLT 282 316 322 209   Cardiac Enzymes: No results for input(s): CKTOTAL, CKMB, CKMBINDEX, TROPONINI in the last 168 hours. BNP: Invalid input(s): POCBNP CBG: No results for input(s): GLUCAP in the  last 168 hours. D-Dimer No results for input(s): DDIMER in the last 72 hours. Hgb A1c No results for input(s): HGBA1C in the last 72 hours. Lipid Profile No results for input(s): CHOL, HDL, LDLCALC, TRIG, CHOLHDL, LDLDIRECT in the last 72 hours. Thyroid function studies Recent Labs    04/01/18 1620  TSH 3.977   Anemia work up No results for input(s): VITAMINB12, FOLATE, FERRITIN, TIBC, IRON, RETICCTPCT in the last 72 hours. Microbiology No results found for this or any previous visit (from the past 240 hour(s)).   Discharge Instructions:   Discharge Instructions    (HEART FAILURE PATIENTS) Call MD:  Anytime you have any of the following symptoms: 1) 3 pound weight gain in 24 hours or 5 pounds in 1 week 2) shortness of breath, with or without a dry hacking cough 3) swelling in the hands, feet or stomach 4) if you have to sleep on extra pillows at night in order to breathe.   Complete by:  As directed    Diet - low sodium heart healthy   Complete by:  As directed    Increase activity slowly   Complete by:  As directed      Allergies as of 04/04/2018      Reactions   Adhesive [tape] Other (See Comments)   (skin tears)  Tolerates PAPER TAPE   Codeine Nausea Only   Other Nausea Only   Anesthesia--nausea       Medication List    TAKE these medications   albuterol 90 MCG/ACT inhaler Commonly known as:  PROVENTIL,VENTOLIN Inhale 2 puffs into the lungs every 4 (four) hours as needed. What changed:  reasons to take this   anastrozole 1 MG tablet Commonly known as:  ARIMIDEX Take 1 tablet (1 mg total) by mouth daily.   aspirin 81 MG EC tablet Take 1 tablet (81 mg total) by mouth daily.   budesonide-formoterol 80-4.5 MCG/ACT inhaler Commonly known as:  SYMBICORT Inhale 2 puffs into the lungs 2 (two) times daily.   chlorpheniramine 4 MG tablet Commonly known as:  CHLOR-TRIMETON Take 4 mg by mouth 2 (two) times daily as needed for allergies.   diphenhydrAMINE 25 MG  tablet Commonly known as:  BENADRYL Take 25 mg by mouth as needed for sleep.   escitalopram 10 MG tablet Commonly known as:  LEXAPRO Take 1 tablet (10 mg total) by mouth daily. What changed:    medication strength  See the new  instructions.   furosemide 40 MG tablet Commonly known as:  LASIX Take 1 tablet (40 mg total) by mouth daily.   imipramine 50 MG tablet Commonly known as:  TOFRANIL Take 4 tablets (200 mg total) by mouth at bedtime.   ipratropium 0.06 % nasal spray Commonly known as:  ATROVENT Place 2 sprays into the nose 4 (four) times daily.   levothyroxine 112 MCG tablet Commonly known as:  SYNTHROID, LEVOTHROID TAKE ONE TABLET BY MOUTH ONCE DAILY What changed:    how much to take  how to take this  when to take this  additional instructions   lisinopril 2.5 MG tablet Commonly known as:  PRINIVIL,ZESTRIL Take 1 tablet (2.5 mg total) by mouth daily.   metoprolol succinate 25 MG 24 hr tablet Commonly known as:  TOPROL-XL   omeprazole 20 MG capsule Commonly known as:  PRILOSEC Take 20 mg by mouth daily.   potassium chloride SA 20 MEQ tablet Commonly known as:  K-DUR,KLOR-CON Take 1 tablet (20 mEq total) by mouth daily.   predniSONE 20 MG tablet Commonly known as:  DELTASONE Take 2 tablets (40 mg total) by mouth daily with breakfast.   simvastatin 20 MG tablet Commonly known as:  ZOCOR Take 1 tablet (20 mg total) by mouth at bedtime.   Tiotropium Bromide Monohydrate 1.25 MCG/ACT Aers Commonly known as:  SPIRIVA RESPIMAT Inhale 2 puffs into the lungs 2 (two) times daily.      Follow-up Information    [facility] Follow up.   Specialty:  Home Health Services Why:  They will do your home health care at your home Contact information: [address] [place] [address] [phone]        Kimberly Huddle, MD. Go on 04/09/2018.   Specialty:  Internal Medicine Why:  BMP re K and Cr @10 :45am Contact information: [address] [place] [address] [phone]            Time coordinating discharge: 35 min  Signed:  Geradine Girt   [facility] 04/04/2018, 8:21 AM

## 2018-04-04 NOTE — Progress Notes (Signed)
Rollater ordered for discharge as requested ( rolling walker with seat); Mindi Slicker [facility] [phone]

## 2018-04-08 ENCOUNTER — Other Ambulatory Visit: Payer: Self-pay

## 2018-04-08 NOTE — Patient Outreach (Signed)
[place] [facility]) Care Management  04/08/2018  Kimberly Waters 1946/10/10 [id number]   EMMI: COPD red alert Referral date: 04/08/18 Referral reason: # of times rescue inhaer used in the past 24 hours: 9 Day #3  Telephone call to patient regarding EMMI COPD red alert. Unable to reach patient or leave voice message.  Message states patients mailbox is full and cannot accept messages.   PLAN: RNCM will attempt 2nd telephone call to patient within 4 business days.  RNCM will send patient outreach letter to attempt contact.   Quinn Plowman RN,BSN,CCM [facility] Telephonic  [phone]

## 2018-04-09 DIAGNOSIS — J441 Chronic obstructive pulmonary disease with (acute) exacerbation: Secondary | ICD-10-CM | POA: Diagnosis not present

## 2018-04-09 DIAGNOSIS — J439 Emphysema, unspecified: Secondary | ICD-10-CM | POA: Diagnosis not present

## 2018-04-09 DIAGNOSIS — I519 Heart disease, unspecified: Secondary | ICD-10-CM | POA: Diagnosis not present

## 2018-04-12 ENCOUNTER — Other Ambulatory Visit: Payer: Self-pay

## 2018-04-12 NOTE — Patient Outreach (Signed)
[place] [facility]) Care Management  04/12/2018  Kimberly Waters 03/01/1946 [id number]    EMMI: COPD red alert Referral date: 04/08/18 Referral reason: # of times rescue inhaer used in the past 24 hours: 9 Day #3 Attempt #2  Telephone call to patient regarding EMMI COPD red alert. Unable to reach. HIPAA compliant voice message left with call back phone number.   PLAN: RNCM will attempt 3rd telephone call to patient within 4 business days.   Quinn Plowman RN,BSN,CCM [facility] Telephonic  [phone]

## 2018-04-16 ENCOUNTER — Other Ambulatory Visit: Payer: Self-pay | Admitting: *Deleted

## 2018-04-16 NOTE — Patient Outreach (Signed)
[facility]) Care Management  04/16/2018  SHAIDA ROUTE 01-13-1946 [id number]  EMMI-COPD=Red Alert Day#8, 04/12/2018 Reason: # of times rescue inhaler used in past 24 hours-"9"  Telephone call to patient, who was advised of reason for call.  Voices that she wants automated calls stopped. States she has her COPD under control & did not wish to talk any more. Patient disconnected call.  Plan: Advise care management assistant to stop EMMI-calls. Close out case/program.  Sherrin Daisy, RN BSN [place] Management Coordinator [facility] Care Management  [phone]

## 2018-04-17 ENCOUNTER — Ambulatory Visit: Payer: Self-pay

## 2018-04-18 ENCOUNTER — Encounter: Payer: Self-pay | Admitting: Physician Assistant

## 2018-04-26 ENCOUNTER — Telehealth: Payer: Self-pay | Admitting: Oncology

## 2018-04-26 ENCOUNTER — Telehealth: Payer: Self-pay | Admitting: Acute Care

## 2018-04-26 MED ORDER — BUDESONIDE-FORMOTEROL FUMARATE 80-4.5 MCG/ACT IN AERO: 2.0000 | INHALATION_SPRAY | Freq: Two times a day (BID) | RESPIRATORY_TRACT | 5 refills | Status: DC

## 2018-04-26 MED ORDER — TIOTROPIUM BROMIDE MONOHYDRATE 1.25 MCG/ACT IN AERS: 2.0000 | INHALATION_SPRAY | Freq: Two times a day (BID) | RESPIRATORY_TRACT | 5 refills | Status: DC

## 2018-04-26 NOTE — Telephone Encounter (Signed)
Script sent to pt's pharmacy of both spiriva and symbicort for pt.  Called pt letting her know this had been done. Pt expressed understanding. nothing further needed at this time.

## 2018-04-26 NOTE — Telephone Encounter (Signed)
Patient called to reschedule and I tried to call her back regarding 6/3

## 2018-05-01 ENCOUNTER — Ambulatory Visit: Payer: Medicare Other | Admitting: Acute Care

## 2018-05-07 ENCOUNTER — Ambulatory Visit: Payer: Medicare Other | Admitting: Physician Assistant

## 2018-05-09 ENCOUNTER — Ambulatory Visit: Payer: Medicare Other | Admitting: Oncology

## 2018-05-09 ENCOUNTER — Other Ambulatory Visit: Payer: Medicare Other

## 2018-05-27 ENCOUNTER — Telehealth: Payer: Self-pay | Admitting: Acute Care

## 2018-05-27 ENCOUNTER — Encounter: Payer: Self-pay | Admitting: Acute Care

## 2018-05-27 ENCOUNTER — Encounter: Payer: Self-pay | Admitting: Adult Health

## 2018-05-27 ENCOUNTER — Inpatient Hospital Stay (HOSPITAL_BASED_OUTPATIENT_CLINIC_OR_DEPARTMENT_OTHER): Payer: Medicare Other | Admitting: Adult Health

## 2018-05-27 ENCOUNTER — Inpatient Hospital Stay: Payer: Medicare Other | Attending: Oncology

## 2018-05-27 ENCOUNTER — Telehealth: Payer: Self-pay | Admitting: Adult Health

## 2018-05-27 ENCOUNTER — Ambulatory Visit (INDEPENDENT_AMBULATORY_CARE_PROVIDER_SITE_OTHER): Payer: Medicare Other | Admitting: Acute Care

## 2018-05-27 VITALS — BP 132/74 | HR 110 | Temp 98.3°F | Resp 18 | Ht 62.0 in | Wt 194.4 lb

## 2018-05-27 DIAGNOSIS — Z803 Family history of malignant neoplasm of breast: Secondary | ICD-10-CM

## 2018-05-27 DIAGNOSIS — F329 Major depressive disorder, single episode, unspecified: Secondary | ICD-10-CM | POA: Diagnosis not present

## 2018-05-27 DIAGNOSIS — Z800 Family history of malignant neoplasm of digestive organs: Secondary | ICD-10-CM

## 2018-05-27 DIAGNOSIS — Z90710 Acquired absence of both cervix and uterus: Secondary | ICD-10-CM

## 2018-05-27 DIAGNOSIS — Z87891 Personal history of nicotine dependence: Secondary | ICD-10-CM | POA: Insufficient documentation

## 2018-05-27 DIAGNOSIS — Z90722 Acquired absence of ovaries, bilateral: Secondary | ICD-10-CM | POA: Insufficient documentation

## 2018-05-27 DIAGNOSIS — I10 Essential (primary) hypertension: Secondary | ICD-10-CM

## 2018-05-27 DIAGNOSIS — I5033 Acute on chronic diastolic (congestive) heart failure: Secondary | ICD-10-CM | POA: Diagnosis not present

## 2018-05-27 DIAGNOSIS — Z7982 Long term (current) use of aspirin: Secondary | ICD-10-CM | POA: Insufficient documentation

## 2018-05-27 DIAGNOSIS — Z923 Personal history of irradiation: Secondary | ICD-10-CM

## 2018-05-27 DIAGNOSIS — R0609 Other forms of dyspnea: Secondary | ICD-10-CM

## 2018-05-27 DIAGNOSIS — E039 Hypothyroidism, unspecified: Secondary | ICD-10-CM | POA: Insufficient documentation

## 2018-05-27 DIAGNOSIS — C50411 Malignant neoplasm of upper-outer quadrant of right female breast: Secondary | ICD-10-CM

## 2018-05-27 DIAGNOSIS — E785 Hyperlipidemia, unspecified: Secondary | ICD-10-CM | POA: Insufficient documentation

## 2018-05-27 DIAGNOSIS — Z79811 Long term (current) use of aromatase inhibitors: Secondary | ICD-10-CM

## 2018-05-27 DIAGNOSIS — Z86010 Personal history of colonic polyps: Secondary | ICD-10-CM | POA: Diagnosis not present

## 2018-05-27 DIAGNOSIS — R21 Rash and other nonspecific skin eruption: Secondary | ICD-10-CM | POA: Insufficient documentation

## 2018-05-27 DIAGNOSIS — K219 Gastro-esophageal reflux disease without esophagitis: Secondary | ICD-10-CM

## 2018-05-27 DIAGNOSIS — Z79899 Other long term (current) drug therapy: Secondary | ICD-10-CM | POA: Diagnosis not present

## 2018-05-27 DIAGNOSIS — Z9013 Acquired absence of bilateral breasts and nipples: Secondary | ICD-10-CM | POA: Insufficient documentation

## 2018-05-27 DIAGNOSIS — Z170 Estrogen receptor positive status [ER+]: Secondary | ICD-10-CM | POA: Insufficient documentation

## 2018-05-27 LAB — CBC WITH DIFFERENTIAL/PLATELET
BASOS ABS: 0.1 10*3/uL (ref 0.0–0.1)
Basophils Relative: 1 %
EOS PCT: 3 %
Eosinophils Absolute: 0.1 10*3/uL (ref 0.0–0.5)
HEMATOCRIT: 38.3 % (ref 34.8–46.6)
HEMOGLOBIN: 12.9 g/dL (ref 11.6–15.9)
LYMPHS ABS: 0.8 10*3/uL — AB (ref 0.9–3.3)
LYMPHS PCT: 15 %
MCH: 29.8 pg (ref 25.1–34.0)
MCHC: 33.7 g/dL (ref 31.5–36.0)
MCV: 88.6 fL (ref 79.5–101.0)
Monocytes Absolute: 0.4 10*3/uL (ref 0.1–0.9)
Monocytes Relative: 7 %
NEUTROS ABS: 4 10*3/uL (ref 1.5–6.5)
Neutrophils Relative %: 74 %
PLATELETS: 266 10*3/uL (ref 145–400)
RBC: 4.32 MIL/uL (ref 3.70–5.45)
RDW: 15.1 % — ABNORMAL HIGH (ref 11.2–14.5)
WBC: 5.4 10*3/uL (ref 3.9–10.3)

## 2018-05-27 LAB — COMPREHENSIVE METABOLIC PANEL
ALK PHOS: 71 U/L (ref 40–150)
ALT: 25 U/L (ref 0–55)
AST: 18 U/L (ref 5–34)
Albumin: 3.8 g/dL (ref 3.5–5.0)
Anion gap: 13 — ABNORMAL HIGH (ref 3–11)
BILIRUBIN TOTAL: 0.4 mg/dL (ref 0.2–1.2)
BUN: 19 mg/dL (ref 7–26)
CALCIUM: 9.7 mg/dL (ref 8.4–10.4)
CO2: 25 mmol/L (ref 22–29)
Chloride: 100 mmol/L (ref 98–109)
Creatinine, Ser: 1.39 mg/dL — ABNORMAL HIGH (ref 0.60–1.10)
GFR, EST AFRICAN AMERICAN: 43 mL/min — AB (ref >60)
GFR, EST NON AFRICAN AMERICAN: 37 mL/min — AB (ref >60)
Glucose, Bld: 160 mg/dL — ABNORMAL HIGH (ref 70–140)
Potassium: 3.5 mmol/L (ref 3.5–5.1)
Sodium: 138 mmol/L (ref 136–145)
TOTAL PROTEIN: 7.6 g/dL (ref 6.4–8.3)

## 2018-05-27 NOTE — Progress Notes (Addendum)
History of Present Illness Kimberly Waters is a 72 y.o. female former smoker ( 45 pack year smoking history quit 02/2017)   With dyspnea on exertion,chronic cough and  history of breast cancer. ( History of radiation therapy)  She is followed by Dr. Chase Caller . ( Consult 03/08/2018) New diagnosis of diastolic  CHF requiring admission 04/2018 .    05/28/2018 Pt. Presents for follow up. She was admitted to the hospital 4 days after being seen in the office on 03/28/2018. Admit 04/01/2018>>  Discharged 04/04/2018. She was admitted for pulmonary edema and acute diastolic HF exacerbation.  She has a multifactorial process consisting of diastolic heart failure exacerbation superimposed on underlying COPD/obesity as well as anxiety. She was diuresed for 4 L of fluid. Echo done prior to admission this admission  showed 55-60% EF with grade 1 diastolic dysfunction.PA peak pressure was 29 mmHg.She was started on ASA, Lisinopril 2.5 mg daily and BB, with recommendation of close cardiology follow up.       Pt.  states she is much better since admission. She is weighing herself daily, and knows to call her PCP/ cards for weight gain.  She was discharged on a prednisone taper, which she states she completed. She is compliant with her  Symbicort, and Spiriva. Both   Have worked  well for her, and she has much less dyspnea,  but she cannot afford them.We checked with her pharmacy and she is in her donut hole.We will send her some with samples as able. Her dose of Lexapro was increased to 10 mg daily for her anxiety and depression. She was noted to have normocytic anemia without blood loss, so most  likely due to chronic disease. She denies fever, chest pain, orthopnea or hemoptysis.   Body mass index is 35.37 kg/m.>> encouraged to work on weight loss.  Test Results: Echo 04/2018>> EF 70-62%, grade 1 diastolic dysfunction PAP 29 mm Hg  PFT's 4/3//2019>> FVC: 1.62, 2.75, 58% FEV1 1.09, 2.07, 52% F/F Ratio: 67,76,  88% SVC 1.83,2.75,66% TLC 88% DLCO: 46%  HRCT 03/26/2018 IMPRESSION: 1. No definitive evidence of interstitial lung disease. 2. Mild pulmonary edema and small right pleural effusion. 3. Aortic atherosclerosis (ICD10-170.0). Coronary artery calcification. 4. Emphysema (ICD10-J43.9). 5. Hepatic steatosis. 6. Left adrenal adenoma.      CBC Latest Ref Rng & Units 05/27/2018 04/04/2018 04/03/2018  WBC 3.9 - 10.3 K/uL 5.4 6.2 7.9  Hemoglobin 11.6 - 15.9 g/dL 12.9 11.0(L) 11.2(L)  Hematocrit 34.8 - 46.6 % 38.3 34.4(L) 35.1(L)  Platelets 145 - 400 K/uL 266 209 322    BMP Latest Ref Rng & Units 05/27/2018 04/04/2018 04/03/2018  Glucose 70 - 140 mg/dL 160(H) 78 71  BUN 7 - 26 mg/dL 19 25(H) 22(H)  Creatinine 0.60 - 1.10 mg/dL 1.39(H) 1.16(H) 1.14(H)  Sodium 136 - 145 mmol/L 138 137 140  Potassium 3.5 - 5.1 mmol/L 3.5 3.8 3.2(L)  Chloride 98 - 109 mmol/L 100 99(L) 103  CO2 22 - 29 mmol/L 25 24 25   Calcium 8.4 - 10.4 mg/dL 9.7 9.0 8.9    BNP    Component Value Date/Time   BNP 341.2 (H) 04/01/2018 0908    ProBNP    Component Value Date/Time   PROBNP 221.0 (H) 03/29/2018 1416    PFT    Component Value Date/Time   FEV1PRE 1.09 03/28/2018 1038   FEV1POST 1.15 03/28/2018 1038   FVCPRE 1.62 03/28/2018 1038   FVCPOST 1.65 03/28/2018 1038   TLC 4.21 03/28/2018 1038  DLCOUNC 10.10 03/28/2018 1038   PREFEV1FVCRT 67 03/28/2018 1038   PSTFEV1FVCRT 70 03/28/2018 1038    No results found.   Past medical hx Past Medical History:  Diagnosis Date  . Allergy   . Breast cancer ([place])   . Colon polyps   . Depression   . Dyspnea    SEASONAL  . Family history of breast cancer   . Family history of colon cancer   . GERD (gastroesophageal reflux disease)   . History of radiation therapy 06/05/17-07/20/17   right chest wall 50.4 Gy in 28 fractions, right axillary nodal region 45 Gy in 25 fractions  . Hyperlipidemia   . Hypertension   . Hypothyroidism   . PONV (postoperative  nausea and vomiting)      Social History   Tobacco Use  . Smoking status: Former Smoker    Packs/day: 1.00    Years: 45.00    Pack years: 45.00    Types: Cigarettes    Last attempt to quit: 02/22/2017    Years since quitting: 1.2  . Smokeless tobacco: Never Used  Substance Use Topics  . Alcohol use: No    Alcohol/week: 0.0 oz  . Drug use: No    Kimberly Waters reports that she quit smoking about 15 months ago. Her smoking use included cigarettes. She has a 45.00 pack-year smoking history. She has never used smokeless tobacco. She reports that she does not drink alcohol or use drugs.  Tobacco Cessation: Former smoker quit 2018 with a 45 pack year smoking history  Past surgical hx, Family hx, Social hx all reviewed.  Current Outpatient Medications on File Prior to Visit  Medication Sig  . albuterol (PROVENTIL,VENTOLIN) 90 MCG/ACT inhaler Inhale 2 puffs into the lungs every 4 (four) hours as needed. (Patient taking differently: Inhale 2 puffs into the lungs every 4 (four) hours as needed for wheezing or shortness of breath. )  . anastrozole (ARIMIDEX) 1 MG tablet Take 1 tablet (1 mg total) by mouth daily. (Patient taking differently: Take 1 mg by mouth daily. )  . aspirin EC 81 MG EC tablet Take 1 tablet (81 mg total) by mouth daily.  . budesonide-formoterol (SYMBICORT) 80-4.5 MCG/ACT inhaler Inhale 2 puffs into the lungs 2 (two) times daily.  . chlorpheniramine (CHLOR-TRIMETON) 4 MG tablet Take 4 mg by mouth 2 (two) times daily as needed for allergies.  . diphenhydrAMINE (BENADRYL) 25 MG tablet Take 25 mg by mouth as needed for sleep.  Marland Kitchen escitalopram (LEXAPRO) 10 MG tablet Take 1 tablet (10 mg total) by mouth daily.  . furosemide (LASIX) 40 MG tablet Take 1 tablet (40 mg total) by mouth daily.  Marland Kitchen ipratropium (ATROVENT) 0.06 % nasal spray Place 2 sprays into the nose 4 (four) times daily.  Marland Kitchen levothyroxine (SYNTHROID, LEVOTHROID) 112 MCG tablet TAKE ONE TABLET BY MOUTH ONCE DAILY (Patient  taking differently: Take 112 mcg by mouth daily before breakfast. )  . lisinopril (PRINIVIL,ZESTRIL) 2.5 MG tablet Take 1 tablet (2.5 mg total) by mouth daily.  . metoprolol succinate (TOPROL-XL) 25 MG 24 hr tablet Take 25 mg by mouth daily.   Marland Kitchen omeprazole (PRILOSEC) 20 MG capsule Take 20 mg by mouth daily.  . potassium chloride SA (K-DUR,KLOR-CON) 20 MEQ tablet Take 1 tablet (20 mEq total) by mouth daily.  . simvastatin (ZOCOR) 20 MG tablet Take 1 tablet (20 mg total) by mouth at bedtime.  . Tiotropium Bromide Monohydrate (SPIRIVA RESPIMAT) 1.25 MCG/ACT AERS Inhale 2 puffs into the lungs 2 (two) times  daily.  . imipramine (TOFRANIL) 50 MG tablet Take 4 tablets (200 mg total) by mouth at bedtime.   No current facility-administered medications on file prior to visit.      Allergies  Allergen Reactions  . Adhesive [Tape] Other (See Comments)    (skin tears)  Tolerates PAPER TAPE  . Codeine Nausea Only  . Other Nausea Only    Anesthesia--nausea     Review Of Systems:  Constitutional:   No  weight loss, night sweats,  Fevers, chills, fatigue, or  lassitude.  HEENT:   No headaches,  Difficulty swallowing,  Tooth/dental problems, or  Sore throat,                No sneezing, itching, ear ache, nasal congestion, post nasal drip,   CV:  No chest pain,  Orthopnea, PND, less swelling in lower extremities, anasarca, dizziness, palpitations, syncope.   GI  No heartburn, indigestion, abdominal pain, nausea, vomiting, diarrhea, change in bowel habits, loss of appetite, bloody stools.   Resp: + shortness of breath with exertion less at rest.  No excess mucus, no productive cough,  No non-productive cough,  No coughing up of blood.  No change in color of mucus.  No wheezing.  No chest wall deformity  Skin: no rash or lesions.  GU: no dysuria, change in color of urine, no urgency or frequency.  No flank pain, no hematuria   MS:  No joint pain or swelling.  No decreased range of motion.  No back  pain.  Psych:  No change in mood or affect. No depression or anxiety.  No memory loss.   Vital Signs BP 120/70 (BP Location: Left Arm, Cuff Size: Normal)   Pulse (!) 104   Ht 5\' 2"  (1.575 m)   Wt 192 lb 9.6 oz (87.4 kg)   SpO2 98%   BMI 35.23 kg/m    Physical Exam:  General- No distress,  A&Ox3, pleasant ENT: No sinus tenderness, TM clear, pale nasal mucosa, no oral exudate,no post nasal drip, no LAN Cardiac: S1, S2, regular rate and rhythm, no murmur Chest: No wheeze/ rales/ dullness; no accessory muscle use, no nasal flaring, no sternal retractions Abd.: Soft Non-tender, ND, BS +, Body mass index is 35.23 kg/m. Ext: No clubbing cyanosis, trace BLE  edema Neuro: deconditioned at baseline, MAE x 4. A&O x 3 Skin: No rashes, warm and dry, several small scabbed areas due to itching ( She scratches her skin often) Psych: Anxiety is better this visit than previous visits.   Assessment/Plan  Dyspnea on exertion Multifactorial process consisting of diastolic heart failure exacerbation superimposed on underlying COPD/obesity as well as anxiety. Better since diuresis for CHF Complaint with Symbicort  And Spiriva, feels they help, but she cannot afford them  ( Pt was offered patient assistance, and when she realized her husbands salary would be included in the assessment she stated to [place], CMA that they have money, and she would not qualify). Plan: Continue the Ipatropium nasal solution 2 sprays twice daily. Continue Symbicort 2 puffs twice daily Rinse mouth after use. Continue Spiriva 2 puffs once daily. Samples have been provided. Contiinue Zyrtec daily for allergies and itching. Needs O&O Sleep study scheduled . Follow up in 3 months with Dr. Chase Caller or Judson Roch NP. Please contact office for sooner follow up if symptoms do not improve or worsen or seek emergency care    Acute exacerbation of CHF (congestive heart failure) ([place]) Diastol HF requiring hospitalization  04/2018 Better after  diuresis of 4 L Compliant with lasix daily Complaint with daily weights Plan  Continue Lasix 40 mg daily Daily weights Call Cards/ PCP regarding weight gain for additional lasix dosing Low salt diet Follow up appointment with Cards 6/4 Follow up with Dr. Walden Desanctis NP in 3 months or sooner if needed.    Magdalen Spatz, NP 05/28/2018  2:47 PM

## 2018-05-27 NOTE — Assessment & Plan Note (Signed)
Diastol HF requiring hospitalization 04/2018 Better after diuresis of 4 L Compliant with lasix daily Complaint with daily weights Plan  Continue Lasix 40 mg daily Daily weights Call Cards/ PCP regarding weight gain for additional lasix dosing Low salt diet Follow up appointment with Cards 6/4 Follow up with Dr. Ridgeway Desanctis NP in 3 months or sooner if needed.

## 2018-05-27 NOTE — Assessment & Plan Note (Addendum)
Multifactorial process consisting of diastolic heart failure exacerbation superimposed on underlying COPD/obesity as well as anxiety. Better since diuresis for CHF Complaint with Symbicort  And Spiriva, feels they help, but she cannot afford them  ( Pt was offered patient assistance, and when she realized her husbands salary would be included in the assessment she stated to [place], CMA that they have money, and she would not qualify). Plan: Continue the Ipatropium nasal solution 2 sprays twice daily. Continue Symbicort 2 puffs twice daily Rinse mouth after use. Continue Spiriva 2 puffs once daily. Samples have been provided. Contiinue Zyrtec daily for allergies and itching. Needs O&O Sleep study scheduled . Follow up in 3 months with Dr. Chase Caller or Judson Roch NP. Please contact office for sooner follow up if symptoms do not improve or worsen or seek emergency care

## 2018-05-27 NOTE — Telephone Encounter (Signed)
Jonelle Sidle, Please make sure the O&O gets scheduled as soon as possible. It was ordered awhile ago. Thanks

## 2018-05-27 NOTE — Patient Instructions (Addendum)
It is nice to see you today. I am so glad you are feeling better. We will check your formulary for medications that your insurance covers better. Take lasix every day as you have been doing . Weight yourself every day and let your doctor know if your weight is up. Continue the Ipatropium nasal solution 2 sprays twice daily. Continue Symbicort 2 puffs twice daily Rinse mouth after use. Continue Spiriva 2 puffs once daily. Contiinue Zyrtec daily for allergies and itching. Needs O&O Sleep study scheduled . Follow up in 3 months with Dr. Chase Caller or Judson Roch NP. Please contact office for sooner follow up if symptoms do not improve or worsen or seek emergency care

## 2018-05-27 NOTE — Telephone Encounter (Signed)
Gave avs and calendar ° °

## 2018-05-27 NOTE — Progress Notes (Signed)
[place]  [phone] [phone]     ID: LEVADA BOWERSOX DOB: [birth year]  MR#: [id number]  RCV#:[id number]  Patient Care Team: Josetta Huddle, MD as PCP - General (Internal Medicine) Juanita Craver, MD as Consulting Physician (Gastroenterology) Magrinat, Virgie Dad, MD as Consulting Physician (Oncology) Excell Seltzer, MD as Consulting Physician (General Surgery) Gery Pray, MD as Consulting Physician (Radiation Oncology) Delice Bison Charlestine Massed, NP as Nurse Practitioner (Hematology and Oncology) OTHER MD:  CHIEF COMPLAINT: Estrogen receptor positive breast cancer  CURRENT TREATMENT: Anastrozole   BREAST CANCER HISTORY: From the original intake note:  [name] had an unremarkable mammogram 07/01/2012, after which she did not obtain further mammography. Sometime late 2017 she noted the right nipple was inverted. She brought this to medical attention and on 01/25/2017 she underwent bilateral mammography with tomography and right breast ultrasonography at [facility]. The breast density was category C. In the central right breast there was an area of architectural distortion associated with nipple retraction. Ultrasound confirmed a 2.2 cm irregular mass in the right breast central to the nipple. In addition there was a 2.5 cm irregular mass in the right breast superiorly. The right axilla was sonographically benign.  On 01/29/2017 she underwent biopsy of both these masses, and both showed morphologically similar invasive ductal carcinomas, grade 1 or 2, with evidence of lymphovascular invasion. Both tumors were 100% estrogen receptor positive with strong staining intensity, and 60-100% progesterone receptor positive, with strong staining intensity. The MIP-1 varied from 5-10%. Both tumors were HER-2 negative, with a signals ratio 1.12-1.39, and the number per cell 2.30-3.97.  The patient's subsequent history is as detailed below   INTERVAL HISTORY: Kimberly Waters returns  today for follow-up and treatment of her estrogen receptor positive breast cancer. She continues on anastrozole.  She has had some medical issues since we last saw her.  She has been hospitalized for heart failure and was discharged on 04/04/2018.  She is seeing Ermalinda Barrios in cardiology soon.  She also has f/u with pulmonology tomorrow.    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  Her mother is in a retirement community.    Kimberly Waters started itching about 4 months ago.  She can't quite recall.  She isn't taking anything new.  She was evaluated by dermatology and was told that she had dry skin.  She had a full skin check at that time as well.    Kimberly Waters is not taking Anastrozole currently.  She went to have it refilled at [facility] on [address] and was told that it was recalled and not on the market.  She has been off her anti estrogen therapy for 6 weeks.    Otherwise she is doing well and is without any new concerns.  She denies any fevers, chills, headaches, abdominal concerns, a detailed ROS was non contributory today.      PAST MEDICAL HISTORY: Past Medical History:  Diagnosis Date  . Allergy   . Breast cancer ([place])   . Colon polyps   . Depression   . Dyspnea    SEASONAL  . Family history of breast cancer   . Family history of colon cancer   . GERD (gastroesophageal reflux disease)   . History of radiation therapy 06/05/17-07/20/17   right chest wall 50.4 Gy in 28 fractions, right axillary nodal region 45 Gy in 25 fractions  . Hyperlipidemia   . Hypertension   . Hypothyroidism   .  PONV (postoperative nausea and vomiting)     PAST SURGICAL HISTORY: Past Surgical History:  Procedure Laterality Date  . ABDOMINAL HYSTERECTOMY    . KNEE SURGERY    . MASTECTOMY W/ SENTINEL NODE BIOPSY Right 03/05/2017   Procedure: BILATERAL TOTAL MASTECTOMIES WITH RIGHT SENTINEL LYMPH NODE BIOPSIES;  Surgeon: Excell Seltzer, MD;   Location: [facility];  Service: General;  Laterality: Right;  . SHOULDER SURGERY     BILAT    FAMILY HISTORY Family History  Problem Relation Age of Onset  . Colon cancer Maternal Grandfather 13  . Colon cancer Maternal Aunt 54  . Colon cancer Maternal Uncle        dx in his 74s  . Breast cancer Paternal Aunt 47  . Breast cancer Cousin 36       paternal first cousin  . Lung cancer Paternal Grandfather   . Colon cancer Maternal Uncle   . Head & neck cancer Maternal Uncle        oral cancer   The patient's father died at age 75. Her mother is 72 years old as of February 2018. The patient had no brothers, 1 sister. On the mother's side a grandfather, and an uncle all had colon cancer's. On the father's side there is an aunt and a cousin with breast cancer, both diagnosed in their late 25s. There is no history of ovarian cancer in the family.  GYNECOLOGIC HISTORY:  No LMP recorded. Patient has had a hysterectomy.  menarche age 89, first live birth age 28, the patient is [place] P2. She underwent total abdominal hysterectomy with bilateral salpingo-oophorectomy remotely and took hormone replacement for approximately 6 years.   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 Her husband [name] ("[address]") Horstman is retired from [employer]. Their son [name] is an Chief Financial Officer living in [place]. Their daughter [name] works at [employer] in [place] the patient has 5 grandchildren. She is a Tourist information centre manager     ADVANCED DIRECTIVES:  not in place    HEALTH MAINTENANCE: Social History   Tobacco Use  . Smoking status: Former Smoker    Packs/day: 1.00    Years: 45.00    Pack years: 45.00    Types: Cigarettes    Last attempt to quit: 02/22/2017    Years since quitting: 1.2  . Smokeless tobacco: Never Used  Substance Use Topics  . Alcohol use: No    Alcohol/week: 0.0 oz  . Drug use: No     Colonoscopy:  03/18/2014/[facility]  PAP: Status post hysterectomy  Bone density: Never    Allergies  Allergen Reactions  . Adhesive [Tape] Other (See Comments)    (skin tears)  Tolerates PAPER TAPE  . Codeine Nausea Only  . Other Nausea Only    Anesthesia--nausea     Current Outpatient Medications  Medication Sig Dispense Refill  . albuterol (PROVENTIL,VENTOLIN) 90 MCG/ACT inhaler Inhale 2 puffs into the lungs every 4 (four) hours as needed. (Patient taking differently: Inhale 2 puffs into the lungs every 4 (four) hours as needed for wheezing or shortness of breath. ) 17 g 3  . anastrozole (ARIMIDEX) 1 MG tablet Take 1 tablet (1 mg total) by mouth daily. (Patient taking differently: Take 1 mg by mouth daily. ) 90 tablet 4  . aspirin EC 81 MG EC tablet Take 1 tablet (81 mg total) by mouth daily.    . budesonide-formoterol (SYMBICORT) 80-4.5 MCG/ACT inhaler Inhale  2 puffs into the lungs 2 (two) times daily. 1 Inhaler 5  . chlorpheniramine (CHLOR-TRIMETON) 4 MG tablet Take 4 mg by mouth 2 (two) times daily as needed for allergies.    . diphenhydrAMINE (BENADRYL) 25 MG tablet Take 25 mg by mouth as needed for sleep.    Marland Kitchen escitalopram (LEXAPRO) 10 MG tablet Take 1 tablet (10 mg total) by mouth daily. 30 tablet 0  . furosemide (LASIX) 40 MG tablet Take 1 tablet (40 mg total) by mouth daily. 30 tablet 0  . ipratropium (ATROVENT) 0.06 % nasal spray Place 2 sprays into the nose 4 (four) times daily.  0  . levothyroxine (SYNTHROID, LEVOTHROID) 112 MCG tablet TAKE ONE TABLET BY MOUTH ONCE DAILY (Patient taking differently: Take 112 mcg by mouth daily before breakfast. ) 30 tablet 0  . lisinopril (PRINIVIL,ZESTRIL) 2.5 MG tablet Take 1 tablet (2.5 mg total) by mouth daily. 30 tablet 0  . metoprolol succinate (TOPROL-XL) 25 MG 24 hr tablet     . omeprazole (PRILOSEC) 20 MG capsule Take 20 mg by mouth daily.    . potassium chloride SA (K-DUR,KLOR-CON) 20 MEQ tablet Take 1 tablet (20 mEq total) by mouth daily. 30 tablet  0  . predniSONE (DELTASONE) 20 MG tablet Take 2 tablets (40 mg total) by mouth daily with breakfast. 2 tablet 0  . simvastatin (ZOCOR) 20 MG tablet Take 1 tablet (20 mg total) by mouth at bedtime. 90 tablet 0  . Tiotropium Bromide Monohydrate (SPIRIVA RESPIMAT) 1.25 MCG/ACT AERS Inhale 2 puffs into the lungs 2 (two) times daily. 1 Inhaler 5  . imipramine (TOFRANIL) 50 MG tablet Take 4 tablets (200 mg total) by mouth at bedtime. 360 tablet 0   No current facility-administered medications for this visit.   A  OBJECTIVE:   Vitals:   05/27/18 0913  BP: 132/74  Pulse: (!) 110  Resp: 18  Temp: 98.3 F (36.8 C)  SpO2: 100%   Body mass index is 35.56 kg/m.    ECOG FS:1 - Symptomatic but completely ambulatory  HR recheck at 95 manually GENERAL: Patient is a well appearing female in no acute distress HEENT:  Sclerae anicteric.  Oropharynx clear and moist. No ulcerations or evidence of oropharyngeal candidiasis. Neck is supple.  NODES:  No cervical, supraclavicular, or axillary lymphadenopathy palpated.  BREAST EXAM:  S/p bilateral mastectomies, no nodules, masses or any sign of recurrence LUNGS:  Clear to auscultation bilaterally.  No wheezes or rhonchi. HEART:  Regular rate and rhythm. No murmur appreciated. ABDOMEN:  Soft, nontender.  Positive, normoactive bowel sounds. No organomegaly palpated. MSK:  No focal spinal tenderness to palpation. Full range of motion bilaterally in the upper extremities. EXTREMITIES:  No peripheral edema.   SKIN:  Clear with no obvious rashes or skin changes. No nail dyscrasia. NEURO:  Nonfocal. Well oriented.  Appropriate affect.     LAB RESULTS:  CMP     Component Value Date/Time   NA 137 04/04/2018 0448   NA 137 10/16/2017 1059   K 3.8 04/04/2018 0448   K 4.1 10/16/2017 1059   CL 99 (L) 04/04/2018 0448   CO2 24 04/04/2018 0448   CO2 25 10/16/2017 1059   GLUCOSE 78 04/04/2018 0448   GLUCOSE 113 10/16/2017 1059   BUN 25 (H) 04/04/2018 0448    BUN 20.3 10/16/2017 1059   CREATININE 1.16 (H) 04/04/2018 0448   CREATININE 1.2 (H) 10/16/2017 1059   CALCIUM 9.0 04/04/2018 0448   CALCIUM 9.3 10/16/2017 1059  PROT 6.6 04/01/2018 1010   PROT 7.2 10/16/2017 1059   ALBUMIN 3.3 (L) 04/01/2018 1010   ALBUMIN 3.5 10/16/2017 1059   AST 32 04/01/2018 1010   AST 17 10/16/2017 1059   ALT 52 04/01/2018 1010   ALT 25 10/16/2017 1059   ALKPHOS 72 04/01/2018 1010   ALKPHOS 48 10/16/2017 1059   BILITOT 0.5 04/01/2018 1010   BILITOT 0.40 10/16/2017 1059   GFRNONAA 47 (L) 04/04/2018 0448   GFRAA 54 (L) 04/04/2018 0448    INo results found for: SPEP, UPEP  Lab Results  Component Value Date   WBC 5.4 05/27/2018   NEUTROABS 4.0 05/27/2018   HGB 12.9 05/27/2018   HCT 38.3 05/27/2018   MCV 88.6 05/27/2018   PLT 266 05/27/2018      Chemistry      Component Value Date/Time   NA 137 04/04/2018 0448   NA 137 10/16/2017 1059   K 3.8 04/04/2018 0448   K 4.1 10/16/2017 1059   CL 99 (L) 04/04/2018 0448   CO2 24 04/04/2018 0448   CO2 25 10/16/2017 1059   BUN 25 (H) 04/04/2018 0448   BUN 20.3 10/16/2017 1059   CREATININE 1.16 (H) 04/04/2018 0448   CREATININE 1.2 (H) 10/16/2017 1059      Component Value Date/Time   CALCIUM 9.0 04/04/2018 0448   CALCIUM 9.3 10/16/2017 1059   ALKPHOS 72 04/01/2018 1010   ALKPHOS 48 10/16/2017 1059   AST 32 04/01/2018 1010   AST 17 10/16/2017 1059   ALT 52 04/01/2018 1010   ALT 25 10/16/2017 1059   BILITOT 0.5 04/01/2018 1010   BILITOT 0.40 10/16/2017 1059       No results found for: LABCA2  No components found for: LABCA125  No results for input(s): INR in the last 168 hours.  Urinalysis No results found for: COLORURINE, APPEARANCEUR, LABSPEC, PHURINE, GLUCOSEU, HGBUR, BILIRUBINUR, KETONESUR, PROTEINUR, UROBILINOGEN, NITRITE, LEUKOCYTESUR   STUDIES: No results found.  ELIGIBLE FOR AVAILABLE RESEARCH PROTOCOL: no  ASSESSMENT: 72 y.[place], [place]  Woman status post biopsy of 2  separate masses in the central right breast 01/29/2017, showing a clinical mT2 N0 invasive ductal carcinoma, stage IB in the new classification: Both masses being estrogen receptor and progesterone receptor positive, HER-2 not amplified, with an MIB-1 between 5 and 10%.  (1) status post bilateral mastectomies 03/05/2017 showing  (a) on the left, no malignancy  (b) On the right, a pT3 pN0, stage IIA invasive ductal carcinoma, grade 2,  with negative margins    (c) patient is not interested in reconstruction  (2) Oncotype score of 6 predicts a 10 year risk of recurrence outside the breast of 5% if the patient's only systemic therapy is tamoxifen for 5 years. It also predicts no benefit from chemotherapy.  (3) adjuvant radiation 06/05/17 - 07/20/17 Right Chest Wall/ 50.4 Gy in 28 fractions Right axillaryNodal region// 45 Gy in 25 fractions Mastectomy scar boost 10 gray in 5 fractions   (4) started anastrozole May 2018  (a) bone density at [facility] 04/24/2017 showed a T score of -0.3  (5) genetics testing through the Hereditary Gene Panel offered by [facility] i found no deleterious mutations in APC, ATM, AXIN2, BARD1, BMPR1A, BRCA1, BRCA2, BRIP1, CDH1, CDKN2A (p14ARF), CDKN2A (p16INK4a), CHEK2, DICER1, EPCAM (Deletion/duplication testing only), GREM1 (promoter region deletion/duplication testing only), KIT, MEN1, MLH1, MSH2, MSH6, MUTYH, NBN, NF1, PALB2, PDGFRA, PMS2, POLD1, POLE, PTEN, RAD50, RAD51C, RAD51D, SDHB, SDHC, SDHD, SMAD4, SMARCA4. STK11, TP53, TSC1, TSC2, and VHL.  The following gene was evaluated for sequence changes only: SDHA and HOXB13 c.251G>A variant only  (a) 2 VUS were identified:  MSH6 c.3394G>C and TSC1 c.1481C>T  (6) tobacco abuse: Patient quit smoking 03/03/2017   PLAN: Jerald is doing well.  She is clinically without any sign of recurrence of her estrogen positive breast cancer.  We called her pharmacy, and her Anastrozole is ready to be picked up.  She will go ahead and be  restarted on that.    I recommended she continue to f/u with cardiology and pulmonology as scheduled.  Healthy diet and exercise (as cleared by cardiology and pulmonology) are recommended.  She will return in one year for follow up with Dr. Jana Hakim.  She can see Dr. Excell Seltzer in about six months.    She knows to call for any other issues that may develop before her next visit here.  A total of (20) minutes of face-to-face time was spent with this patient with greater than 50% of that time in counseling and care-coordination.  Wilber Bihari, NP  05/27/18 9:30 AM Medical Oncology and Hematology [facility] [address] [place], [place] [address] Tel. [phone]    Fax. [phone]

## 2018-05-28 ENCOUNTER — Ambulatory Visit (INDEPENDENT_AMBULATORY_CARE_PROVIDER_SITE_OTHER): Payer: Medicare Other | Admitting: Physician Assistant

## 2018-05-28 ENCOUNTER — Encounter: Payer: Self-pay | Admitting: Physician Assistant

## 2018-05-28 ENCOUNTER — Telehealth: Payer: Self-pay | Admitting: Acute Care

## 2018-05-28 VITALS — BP 120/70 | HR 96 | Ht 62.0 in | Wt 194.1 lb

## 2018-05-28 DIAGNOSIS — E782 Mixed hyperlipidemia: Secondary | ICD-10-CM | POA: Diagnosis not present

## 2018-05-28 DIAGNOSIS — I10 Essential (primary) hypertension: Secondary | ICD-10-CM

## 2018-05-28 DIAGNOSIS — I5030 Unspecified diastolic (congestive) heart failure: Secondary | ICD-10-CM | POA: Insufficient documentation

## 2018-05-28 DIAGNOSIS — R079 Chest pain, unspecified: Secondary | ICD-10-CM | POA: Diagnosis not present

## 2018-05-28 DIAGNOSIS — J45909 Unspecified asthma, uncomplicated: Secondary | ICD-10-CM

## 2018-05-28 DIAGNOSIS — I5032 Chronic diastolic (congestive) heart failure: Secondary | ICD-10-CM | POA: Insufficient documentation

## 2018-05-28 NOTE — Telephone Encounter (Signed)
ONO ordered.  

## 2018-05-28 NOTE — Telephone Encounter (Signed)
Call patient and let her know that she needs the sleep study. Explain that CHF is made worse by low oxygen through the night. Her possible sleep apnea could be worsening her CHF. Explain that untreated sleep apnea includes the following risks: High Blood Pressure Heart Disease Stroke, Diabetes Depression Increased risk of car accidents due to sleepiness.   Please reschedule a time for her to pick up machine for evaluation. If she refuses, please document that she was warned of the risks of untreated sleep apnea and is aware that not being tested can contribute to her worsening CHF, and the above risk factors  Thanks so much.

## 2018-05-28 NOTE — Addendum Note (Signed)
Addended by: Jannette Spanner on: 05/28/2018 02:20 PM   Modules accepted: Orders

## 2018-05-28 NOTE — Progress Notes (Signed)
Cardiology Office Note    Date:  05/28/2018   ID:  Kimberly Waters, DOB 24-Jan-1946, MRN [id number]  PCP:  Josetta Huddle, MD  Cardiologist: New Chief Complaint  Patient presents with  . New Patient (Initial Visit)    History of Present Illness:  Kimberly Waters is a 72 y.o. female with history of hypothyroidism, breast CA-finished chemo and radiation in 08/2017, COPD with 45-pack-year history of smoking who is referred to [place] after hospitalization for diastolic CHF & COPD exacerbation.  She was discharged 04/04/2018 after progressive dyspnea on exertion and found to have CHF as well as acute respiratory failure with underlying COPD/obesity.  2D echo 03/29/2018 LVEF 55 to 60% no wall motion abnormality, grade 1 DD.  She diuresed 4 L in the hospital.  Blood pressure was up on admission.  Low-dose lisinopril was restarted this had been stopped think it was a culprit of her symptoms.  Also on a beta-blocker.  I received a note from Thedore Mins, NP who saw her yesterday for oncology and she was tachycardic with a heart rate of 110 bpm.  O2 sats 100%.  Patient says she has had some chest tightness at rest that lasts about 45 min relieved with tums. Thinks it's gas but used her mother's NTG before she went to the hospital and this helped. Doing much better since hospitalization.  Denies exertional chest tightness.  Her mother had 2 stents in her 19s.    Past Medical History:  Diagnosis Date  . Allergy   . Breast cancer ([place])   . Colon polyps   . Depression   . Dyspnea    SEASONAL  . Family history of breast cancer   . Family history of colon cancer   . GERD (gastroesophageal reflux disease)   . History of radiation therapy 06/05/17-07/20/17   right chest wall 50.4 Gy in 28 fractions, right axillary nodal region 45 Gy in 25 fractions  . Hyperlipidemia   . Hypertension   . Hypothyroidism   . PONV (postoperative nausea and vomiting)     Past Surgical History:  Procedure Laterality Date  .  ABDOMINAL HYSTERECTOMY    . KNEE SURGERY    . MASTECTOMY W/ SENTINEL NODE BIOPSY Right 03/05/2017   Procedure: BILATERAL TOTAL MASTECTOMIES WITH RIGHT SENTINEL LYMPH NODE BIOPSIES;  Surgeon: Excell Seltzer, MD;  Location: [facility];  Service: General;  Laterality: Right;  . SHOULDER SURGERY     BILAT    Current Medications: Current Meds  Medication Sig  . albuterol (PROVENTIL,VENTOLIN) 90 MCG/ACT inhaler Inhale 2 puffs into the lungs every 4 (four) hours as needed. (Patient taking differently: Inhale 2 puffs into the lungs every 4 (four) hours as needed for wheezing or shortness of breath. )  . anastrozole (ARIMIDEX) 1 MG tablet Take 1 tablet (1 mg total) by mouth daily. (Patient taking differently: Take 1 mg by mouth daily. )  . aspirin EC 81 MG EC tablet Take 1 tablet (81 mg total) by mouth daily.  . budesonide-formoterol (SYMBICORT) 80-4.5 MCG/ACT inhaler Inhale 2 puffs into the lungs 2 (two) times daily.  . chlorpheniramine (CHLOR-TRIMETON) 4 MG tablet Take 4 mg by mouth 2 (two) times daily as needed for allergies.  . diphenhydrAMINE (BENADRYL) 25 MG tablet Take 25 mg by mouth as needed for sleep.  Marland Kitchen escitalopram (LEXAPRO) 10 MG tablet Take 1 tablet (10 mg total) by mouth daily.  . furosemide (LASIX) 40 MG tablet Take 1 tablet (40 mg total) by mouth daily.  Marland Kitchen  imipramine (TOFRANIL) 50 MG tablet Take 4 tablets (200 mg total) by mouth at bedtime.  Marland Kitchen ipratropium (ATROVENT) 0.06 % nasal spray Place 2 sprays into the nose 4 (four) times daily.  Marland Kitchen levothyroxine (SYNTHROID, LEVOTHROID) 112 MCG tablet TAKE ONE TABLET BY MOUTH ONCE DAILY (Patient taking differently: Take 112 mcg by mouth daily before breakfast. )  . lisinopril (PRINIVIL,ZESTRIL) 2.5 MG tablet Take 1 tablet (2.5 mg total) by mouth daily.  . metoprolol succinate (TOPROL-XL) 25 MG 24 hr tablet Take 25 mg by mouth daily.   Marland Kitchen omeprazole (PRILOSEC) 20 MG capsule Take 20 mg by mouth daily.  . potassium chloride SA (K-DUR,KLOR-CON) 20 MEQ  tablet Take 1 tablet (20 mEq total) by mouth daily.  . simvastatin (ZOCOR) 20 MG tablet Take 1 tablet (20 mg total) by mouth at bedtime.  . Tiotropium Bromide Monohydrate (SPIRIVA RESPIMAT) 1.25 MCG/ACT AERS Inhale 2 puffs into the lungs 2 (two) times daily.     Allergies:   Adhesive [tape]; Codeine; and Other   Social History   Socioeconomic History  . Marital status: Married    Spouse name: Not on file  . Number of children: 2  . Years of education: Not on file  . Highest education level: Not on file  Occupational History  . Occupation: reitred [employer]  . Financial resource strain: Not on file  . Food insecurity:    Worry: Not on file    Inability: Not on file  . Transportation needs:    Medical: Not on file    Non-medical: Not on file  Tobacco Use  . Smoking status: Former Smoker    Packs/day: 1.00    Years: 45.00    Pack years: 45.00    Types: Cigarettes    Last attempt to quit: 02/22/2017    Years since quitting: 1.2  . Smokeless tobacco: Never Used  Substance and Sexual Activity  . Alcohol use: No    Alcohol/week: 0.0 oz  . Drug use: No  . Sexual activity: Not on file  Lifestyle  . Physical activity:    Days per week: Not on file    Minutes per session: Not on file  . Stress: Not on file  Relationships  . Social connections:    Talks on phone: Not on file    Gets together: Not on file    Attends religious service: Not on file    Active member of club or organization: Not on file    Attends meetings of clubs or organizations: Not on file    Relationship status: Not on file  Other Topics Concern  . Not on file  Social History Narrative  . Not on file     Family History:  The patient's family history includes Breast cancer (age of onset: 61) in her cousin; Breast cancer (age of onset: 92) in her paternal aunt; Colon cancer in her maternal uncle and maternal uncle; Colon cancer (age of onset: 52) in her maternal grandfather;  Colon cancer (age of onset: 95) in her maternal aunt; Head & neck cancer in her maternal uncle; Heart disease (age of onset: 57) in her mother; Lung cancer in her paternal grandfather.   ROS:   Please see the history of present illness.    Review of Systems  Constitution: Negative.  HENT: Positive for hearing loss.   Eyes: Positive for vision loss in left eye.  Cardiovascular: Positive for dyspnea on exertion.  Respiratory: Positive for cough, snoring and  wheezing.   Hematologic/Lymphatic: Negative.   Musculoskeletal: Negative.  Negative for joint pain.  Gastrointestinal: Positive for constipation and heartburn.  Genitourinary: Negative.   Neurological: Positive for loss of balance.  Psychiatric/Behavioral: The patient is nervous/anxious.    All other systems reviewed and are negative.   PHYSICAL EXAM:   VS:  BP 120/70   Pulse 96   Ht 5\' 2"  (1.575 m)   Wt 194 lb 1.9 oz (88.1 kg)   SpO2 97%   BMI 35.51 kg/m   Physical Exam  GEN: Obese, in no acute distress  Neck: no JVD, carotid bruits, or masses Cardiac:RRR; no murmurs, rubs, or gallops  Respiratory:  clear to auscultation bilaterally, normal work of breathing GI: soft, nontender, nondistended, + BS Ext: without cyanosis, clubbing, or edema, Good distal pulses bilaterally Neuro:  Alert and Oriented x 3 Psych: euthymic mood, full affect  Wt Readings from Last 3 Encounters:  05/28/18 194 lb 1.9 oz (88.1 kg)  05/27/18 192 lb 9.6 oz (87.4 kg)  05/27/18 194 lb 6.4 oz (88.2 kg)      Studies/Labs Reviewed:   EKG:  EKG is ordered today.  The ekg ordered today demonstrates normal sinus rhythm nonspecific intraventricular block, no acute change  Recent Labs: 03/29/2018: Pro B Natriuretic peptide (BNP) 221.0 04/01/2018: B Natriuretic Peptide 341.2; TSH 3.977 04/04/2018: Magnesium 2.1 05/27/2018: ALT 25; BUN 19; Creatinine, Ser 1.39; Hemoglobin 12.9; Platelets 266; Potassium 3.5; Sodium 138   Lipid Panel    Component Value  Date/Time   CHOL 188 12/09/2008 2139   TRIG 216 (H) 12/09/2008 2139   HDL 39 (L) 12/09/2008 2139   CHOLHDL 4.8 Ratio 12/09/2008 2139   VLDL 43 (H) 12/09/2008 2139   LDLCALC 106 (H) 12/09/2008 2139   LDLDIRECT 120 (H) 02/12/2013 1536    Additional studies/ records that were reviewed today include:   2D echo 03/29/2018 Study Conclusions   - Left ventricle: The cavity size was normal. There was moderate   concentric hypertrophy. Systolic function was normal. The   estimated ejection fraction was in the range of 55% to 60%. Wall   motion was normal; there were no regional wall motion   abnormalities. Doppler parameters are consistent with abnormal   left ventricular relaxation (grade 1 diastolic dysfunction). - Aortic valve: There was no regurgitation. - Mitral valve: Transvalvular velocity was within the normal range.   There was no evidence for stenosis. There was mild regurgitation. - Left atrium: The atrium was mildly dilated. - Right ventricle: The cavity size was normal. Wall thickness was   normal. Systolic function was normal. - Tricuspid valve: There was trivial regurgitation. - Pulmonary arteries: Systolic pressure was within the normal   range. PA peak pressure: 29 mm Hg (S).   HRCT 03/26/2018 IMPRESSION: 1. No definitive evidence of interstitial lung disease. 2. Mild pulmonary edema and small right pleural effusion. 3. Aortic atherosclerosis (ICD10-170.0). Coronary artery calcification. 4.  Emphysema (ICD10-J43.9). 5. Hepatic steatosis. 6. Left adrenal adenoma.         ASSESSMENT:    1. Chronic diastolic congestive heart failure ([place])   2. HYPERTENSION, BENIGN SYSTEMIC   3. Chest pain, unspecified type   4. Mixed hyperlipidemia   5. Asthma, unspecified asthma severity, unspecified whether complicated, unspecified whether persistent      PLAN:  In order of problems listed above:  Chronic diastolic CHF new diagnosis for patient with grade 1 DD on 2D echo  in the hospital.  Diuresed 4 L.  Currently compensated.  Continue low-dose Lasix, lisinopril and beta-blocker.  2 g sodium diet discussed and given to her.  Weight loss recommended as well.  Hypertension well controlled.  Patient was a little tachycardic in the office but I am reluctant to increase Toprol with her COPD and asthma.  She does wheeze at times but is not wheezing today.  I suspect tachycardia could also be due to her inhalers.  Chest tightness somewhat atypical but she did get relief from her mother's nitroglycerin.  Discussed with Dr. Marlou Porch and will proceed with Lexiscan Myoview to rule out ischemia.  Multiple CV risk factors including her mother with stents in her 64s.  Follow-up with Dr. Marlou Porch after.  Mixed hyperlipidemia on Zocor.  LDL was 108 07/2017.  Will need repeat lipids in August and possible adjustment of Zocor.  Persistent asthma on multiple inhalers followed by pulmonary.  Suspect this is causing her mild tachycardia.    Medication Adjustments/Labs and Tests Ordered: Current medicines are reviewed at length with the patient today.  Concerns regarding medicines are outlined above.  Medication changes, Labs and Tests ordered today are listed in the Patient Instructions below. Patient Instructions   Medication Instructions: Your physician recommends that you continue on your current medications as directed. Please refer to the Current Medication list given to you today.   Labwork: None  Procedures/Testing: Your physician has requested that you have a lexiscan myoview. For further information please visit [email or web address]. Please follow instruction sheet, as given.    Follow-Up: Follow up with Dr.Skains after you have your Myoview   Any Additional Special Instructions Will Be Listed Below (If Applicable).  Two Gram Sodium Diet 2000 mg  What is Sodium? Sodium is a mineral found naturally in many foods. The most significant source of sodium in the diet is  table salt, which is about 40% sodium.  Processed, convenience, and preserved foods also contain a large amount of sodium.  The body needs only 500 mg of sodium daily to function,  A normal diet provides more than enough sodium even if you do not use salt.  Why Limit Sodium? A build up of sodium in the body can cause thirst, increased blood pressure, shortness of breath, and water retention.  Decreasing sodium in the diet can reduce edema and risk of heart attack or stroke associated with high blood pressure.  Keep in mind that there are many other factors involved in these health problems.  Heredity, obesity, lack of exercise, cigarette smoking, stress and what you eat all play a role.  General Guidelines:  Do not add salt at the table or in cooking.  One teaspoon of salt contains over 2 grams of sodium.  Read food labels  Avoid processed and convenience foods  Ask your dietitian before eating any foods not dicussed in the menu planning guidelines  Consult your physician if you wish to use a salt substitute or a sodium containing medication such as antacids.  Limit milk and milk products to 16 oz (2 cups) per day.  Shopping Hints:  READ LABELS!! "Dietetic" does not necessarily mean low sodium.  Salt and other sodium ingredients are often added to foods during processing.   Menu Planning Guidelines Food Group Choose More Often Avoid  Beverages (see also the milk group All fruit juices, low-sodium, salt-free vegetables juices, low-sodium carbonated beverages Regular vegetable or tomato juices, commercially softened water used for drinking or cooking  Breads and Cereals Enriched white, wheat, rye and pumpernickel bread, hard rolls and dinner rolls;  muffins, cornbread and waffles; most dry cereals, cooked cereal without added salt; unsalted crackers and breadsticks; low sodium or homemade bread crumbs Bread, rolls and crackers with salted tops; quick breads; instant hot cereals; pancakes;  commercial bread stuffing; self-rising flower and biscuit mixes; regular bread crumbs or cracker crumbs  Desserts and Sweets Desserts and sweets mad with mild should be within allowance Instant pudding mixes and cake mixes  Fats Butter or margarine; vegetable oils; unsalted salad dressings, regular salad dressings limited to 1 Tbs; light, sour and heavy cream Regular salad dressings containing bacon fat, bacon bits, and salt pork; snack dips made with instant soup mixes or processed cheese; salted nuts  Fruits Most fresh, frozen and canned fruits Fruits processed with salt or sodium-containing ingredient (some dried fruits are processed with sodium sulfites        Vegetables Fresh, frozen vegetables and low- sodium canned vegetables Regular canned vegetables, sauerkraut, pickled vegetables, and others prepared in brine; frozen vegetables in sauces; vegetables seasoned with ham, bacon or salt pork  Condiments, Sauces, Miscellaneous  Salt substitute with physician's approval; pepper, herbs, spices; vinegar, lemon or lime juice; hot pepper sauce; garlic powder, onion powder, low sodium soy sauce (1 Tbs.); low sodium condiments (ketchup, chili sauce, mustard) in limited amounts (1 tsp.) fresh ground horseradish; unsalted tortilla chips, pretzels, potato chips, popcorn, salsa (1/4 cup) Any seasoning made with salt including garlic salt, celery salt, onion salt, and seasoned salt; sea salt, rock salt, kosher salt; meat tenderizers; monosodium glutamate; mustard, regular soy sauce, barbecue, sauce, chili sauce, teriyaki sauce, steak sauce, Worcestershire sauce, and most flavored vinegars; canned gravy and mixes; regular condiments; salted snack foods, olives, picles, relish, horseradish sauce, catsup   Food preparation: Try these seasonings Meats:    Pork Sage, onion Serve with applesauce  Chicken Poultry seasoning, thyme, parsley Serve with cranberry sauce  Lamb Curry powder, rosemary, garlic, thyme  Serve with mint sauce or jelly  Veal Marjoram, basil Serve with current jelly, cranberry sauce  Beef Pepper, bay leaf Serve with dry mustard, unsalted chive butter  [place] leaf, dill Serve with unsalted lemon butter, unsalted parsley butter  Vegetables:    Asparagus Lemon juice   Broccoli Lemon juice   Carrots Mustard dressing parsley, mint, nutmeg, glazed with unsalted butter and sugar   Green beans Marjoram, lemon juice, nutmeg,dill seed   Tomatoes Basil, marjoram, onion   Spice /blend for [employer]" 4 tsp ground thyme 1 tsp ground sage 3 tsp ground rosemary 4 tsp ground marjoram   Test your knowledge 1. A product that says "Salt Free" may still contain sodium. True or False 2. Garlic Powder and Hot Pepper Sauce an be used as alternative seasonings.True or False 3. Processed foods have more sodium than fresh foods.  True or False 4. Canned Vegetables have less sodium than froze True or False  WAYS TO DECREASE YOUR SODIUM INTAKE 1. Avoid the use of added salt in cooking and at the table.  Table salt (and other prepared seasonings which contain salt) is probably one of the greatest sources of sodium in the diet.  Unsalted foods can gain flavor from the sweet, sour, and butter taste sensations of herbs and spices.  Instead of using salt for seasoning, try the following seasonings with the foods listed.  Remember: how you use them to enhance natural food flavors is limited only by your creativity... Allspice-Meat, fish, eggs, fruit, peas, red and yellow vegetables Almond Extract-Fruit baked goods Anise Seed-Sweet breads, fruit, carrots,  beets, cottage cheese, cookies (tastes like licorice) Basil-Meat, fish, eggs, vegetables, rice, vegetables salads, soups, sauces Bay Leaf-Meat, fish, stews, poultry Burnet-Salad, vegetables (cucumber-like flavor) Caraway Seed-Bread, cookies, cottage cheese, meat, vegetables, cheese, rice Cardamon-Baked goods, fruit, soups Celery Powder or seed-Salads,  salad dressings, sauces, meatloaf, soup, bread.Do not use  celery salt Chervil-Meats, salads, fish, eggs, vegetables, cottage cheese (parsley-like flavor) Chili Power-Meatloaf, chicken cheese, corn, eggplant, egg dishes Chives-Salads cottage cheese, egg dishes, soups, vegetables, sauces Cilantro-Salsa, casseroles Cinnamon-Baked goods, fruit, pork, lamb, chicken, carrots Cloves-Fruit, baked goods, fish, pot roast, green beans, beets, carrots Coriander-Pastry, cookies, meat, salads, cheese (lemon-orange flavor) Cumin-Meatloaf, fish,cheese, eggs, cabbage,fruit pie (caraway flavor) [employer], fruit, eggs, fish, poultry, cottage cheese, vegetables Dill Seed-Meat, cottage cheese, poultry, vegetables, fish, salads, bread Fennel Seed-Bread, cookies, apples, pork, eggs, fish, beets, cabbage, cheese, Licorice-like flavor Garlic-(buds or powder) Salads, meat, poultry, fish, bread, butter, vegetables, potatoes.Do not  use garlic salt Ginger-Fruit, vegetables, baked goods, meat, fish, poultry Horseradish Root-Meet, vegetables, butter Lemon Juice or Extract-Vegetables, fruit, tea, baked goods, fish salads Mace-Baked goods fruit, vegetables, fish, poultry (taste like nutmeg) Maple Extract-Syrups Marjoram-Meat, chicken, fish, vegetables, breads, green salads (taste like Sage) Mint-Tea, lamb, sherbet, vegetables, desserts, carrots, cabbage Mustard, Dry or Seed-Cheese, eggs, meats, vegetables, poultry Nutmeg-Baked goods, fruit, chicken, eggs, vegetables, desserts Onion Powder-Meat, fish, poultry, vegetables, cheese, eggs, bread, rice salads (Do not use   Onion salt) Orange Extract-Desserts, baked goods Oregano-Pasta, eggs, cheese, onions, pork, lamb, fish, chicken, vegetables, green salads Paprika-Meat, fish, poultry, eggs, cheese, vegetables Parsley Flakes-Butter, vegetables, meat fish, poultry, eggs, bread, salads (certain forms may   Contain sodium Pepper-Meat fish, poultry, vegetables,  eggs Peppermint Extract-Desserts, baked goods Poppy Seed-Eggs, bread, cheese, fruit dressings, baked goods, noodles, vegetables, cottage  [employer], poultry, meat, fish, cauliflower, turnips,eggs bread Saffron-Rice, bread, veal, chicken, fish, eggs Sage-Meat, fish, poultry, onions, eggplant, tomateos, pork, stews Savory-Eggs, salads, poultry, meat, rice, vegetables, soups, pork Tarragon-Meat, poultry, fish, eggs, butter, vegetables (licorice-like flavor)  Thyme-Meat, poultry, fish, eggs, vegetables, (clover-like flavor), sauces, soups Tumeric-Salads, butter, eggs, fish, rice, vegetables (saffron-like flavor) Vanilla Extract-Baked goods, candy Vinegar-Salads, vegetables, meat marinades Walnut Extract-baked goods, candy  2. Choose your Foods Wisely   The following is a list of foods to avoid which are high in sodium:  Meats-Avoid all smoked, canned, salt cured, dried and kosher meat and fish as well as Anchovies   [employer] meats:Bologna, Liverwurst, Pastrami Canned meat or fish  Marinated herring Caviar    Pepperoni Corned Beef   Pizza Dried chipped beef  Salami Frozen breaded fish or meat Salt pork Frankfurters or hot dogs  Sardines Gefilte fish   Sausage Ham (boiled ham, Proscuitto Smoked butt    spiced ham)   Spam      TV Dinners Vegetables Canned vegetables (Regular) Relish Canned mushrooms  Sauerkraut Olives    Tomato juice Pickles  Bakery and Dessert Products Canned puddings  Cream pies Cheesecake   Decorated cakes Cookies  Beverages/Juices Tomato juice, regular  Gatorade   V-8 vegetable juice, regular  Breads and Cereals Biscuit mixes   Salted potato chips, corn chips, pretzels Bread stuffing mixes  Salted crackers and rolls Pancake and waffle mixes Self-rising flour  Seasonings Accent    Meat sauces Barbecue sauce  Meat tenderizer Catsup    Monosodium glutamate (MSG) Celery salt   Onion salt Chili  sauce   Prepared mustard Garlic salt   Salt, seasoned salt, sea salt Gravy mixes   Soy sauce  Horseradish   Steak sauce Ketchup   Tartar sauce Lite salt    Teriyaki sauce Marinade mixes   Worcestershire sauce  Others Baking powder   Cocoa and cocoa mixes Baking soda   Commercial casserole mixes Candy-caramels, chocolate  Dehydrated soups    Bars, fudge,nougats  Instant rice and pasta mixes Canned broth or soup  Maraschino cherries Cheese, aged and processed cheese and cheese spreads  Learning Assessment Quiz  Indicated T (for True) or F (for False) for each of the following statements:  1. _____ Fresh fruits and vegetables and unprocessed grains are generally low in sodium 2. _____ Water may contain a considerable amount of sodium, depending on the source 3. _____ You can always tell if a food is high in sodium by tasting it 4. _____ Certain laxatives my be high in sodium and should be avoided unless prescribed   by a physician or pharmacist 5. _____ Salt substitutes may be used freely by anyone on a sodium restricted diet 6. _____ Sodium is present in table salt, food additives and as a natural component of   most foods 7. _____ Table salt is approximately 90% sodium 8. _____ Limiting sodium intake may help prevent excess fluid accumulation in the body 9. _____ On a sodium-restricted diet, seasonings such as bouillon soy sauce, and    cooking wine should be used in place of table salt 10. _____ On an ingredient list, a product which lists monosodium glutamate as the first   ingredient is an appropriate food to include on a low sodium diet  Circle the best answer(s) to the following statements (Hint: there may be more than one correct answer)  11. On a low-sodium diet, some acceptable snack items are:    A. Olives  F. Bean dip   K. Grapefruit juice    B. Salted Pretzels G. Commercial Popcorn   L. Canned peaches    C. Carrot Sticks  H. Bouillon   M. Unsalted nuts   D. [place]  fries  I. Peanut butter crackers N. Salami   E. Sweet pickles J. Tomato Juice   O. Pizza  12.  Seasonings that may be used freely on a reduced - sodium diet include   A. Lemon wedges F.Monosodium glutamate K. Celery seed    B.Soysauce   G. Pepper   L. Mustard powder   C. Sea salt  H. Cooking wine  M. Onion flakes   D. Vinegar  E. Prepared horseradish N. Salsa   E. Sage   J. Worcestershire sauce  O. Chutney     If you need a refill on your cardiac medications before your next appointment, please call your pharmacy.      Sumner Boast, PA-C  05/28/2018 12:47 PM    [facility] [place], [place], [place]  [address] Phone: [phone]; Fax: [phone]

## 2018-05-28 NOTE — Patient Instructions (Signed)
Medication Instructions: Your physician recommends that you continue on your current medications as directed. Please refer to the Current Medication list given to you today.   Labwork: None  Procedures/Testing: Your physician has requested that you have a lexiscan myoview. For further information please visit [email or web address]. Please follow instruction sheet, as given.    Follow-Up: Follow up with Dr.Skains after you have your Myoview   Any Additional Special Instructions Will Be Listed Below (If Applicable).  Two Gram Sodium Diet 2000 mg  What is Sodium? Sodium is a mineral found naturally in many foods. The most significant source of sodium in the diet is table salt, which is about 40% sodium.  Processed, convenience, and preserved foods also contain a large amount of sodium.  The body needs only 500 mg of sodium daily to function,  A normal diet provides more than enough sodium even if you do not use salt.  Why Limit Sodium? A build up of sodium in the body can cause thirst, increased blood pressure, shortness of breath, and water retention.  Decreasing sodium in the diet can reduce edema and risk of heart attack or stroke associated with high blood pressure.  Keep in mind that there are many other factors involved in these health problems.  Heredity, obesity, lack of exercise, cigarette smoking, stress and what you eat all play a role.  General Guidelines:  Do not add salt at the table or in cooking.  One teaspoon of salt contains over 2 grams of sodium.  Read food labels  Avoid processed and convenience foods  Ask your dietitian before eating any foods not dicussed in the menu planning guidelines  Consult your physician if you wish to use a salt substitute or a sodium containing medication such as antacids.  Limit milk and milk products to 16 oz (2 cups) per day.  Shopping Hints:  READ LABELS!! "Dietetic" does not necessarily mean low sodium.  Salt and other sodium  ingredients are often added to foods during processing.   Menu Planning Guidelines Food Group Choose More Often Avoid  Beverages (see also the milk group All fruit juices, low-sodium, salt-free vegetables juices, low-sodium carbonated beverages Regular vegetable or tomato juices, commercially softened water used for drinking or cooking  Breads and Cereals Enriched white, wheat, rye and pumpernickel bread, hard rolls and dinner rolls; muffins, cornbread and waffles; most dry cereals, cooked cereal without added salt; unsalted crackers and breadsticks; low sodium or homemade bread crumbs Bread, rolls and crackers with salted tops; quick breads; instant hot cereals; pancakes; commercial bread stuffing; self-rising flower and biscuit mixes; regular bread crumbs or cracker crumbs  Desserts and Sweets Desserts and sweets mad with mild should be within allowance Instant pudding mixes and cake mixes  Fats Butter or margarine; vegetable oils; unsalted salad dressings, regular salad dressings limited to 1 Tbs; light, sour and heavy cream Regular salad dressings containing bacon fat, bacon bits, and salt pork; snack dips made with instant soup mixes or processed cheese; salted nuts  Fruits Most fresh, frozen and canned fruits Fruits processed with salt or sodium-containing ingredient (some dried fruits are processed with sodium sulfites        Vegetables Fresh, frozen vegetables and low- sodium canned vegetables Regular canned vegetables, sauerkraut, pickled vegetables, and others prepared in brine; frozen vegetables in sauces; vegetables seasoned with ham, bacon or salt pork  Condiments, Sauces, Miscellaneous  Salt substitute with physician's approval; pepper, herbs, spices; vinegar, lemon or lime juice; hot pepper sauce; garlic powder, onion  powder, low sodium soy sauce (1 Tbs.); low sodium condiments (ketchup, chili sauce, mustard) in limited amounts (1 tsp.) fresh ground horseradish; unsalted tortilla  chips, pretzels, potato chips, popcorn, salsa (1/4 cup) Any seasoning made with salt including garlic salt, celery salt, onion salt, and seasoned salt; sea salt, rock salt, kosher salt; meat tenderizers; monosodium glutamate; mustard, regular soy sauce, barbecue, sauce, chili sauce, teriyaki sauce, steak sauce, Worcestershire sauce, and most flavored vinegars; canned gravy and mixes; regular condiments; salted snack foods, olives, picles, relish, horseradish sauce, catsup   Food preparation: Try these seasonings Meats:    Pork Sage, onion Serve with applesauce  Chicken Poultry seasoning, thyme, parsley Serve with cranberry sauce  Lamb Curry powder, rosemary, garlic, thyme Serve with mint sauce or jelly  Veal Marjoram, basil Serve with current jelly, cranberry sauce  Beef Pepper, bay leaf Serve with dry mustard, unsalted chive butter  Fish [place] leaf, dill Serve with unsalted lemon butter, unsalted parsley butter  Vegetables:    Asparagus Lemon juice   Broccoli Lemon juice   Carrots Mustard dressing parsley, mint, nutmeg, glazed with unsalted butter and sugar   Green beans Marjoram, lemon juice, nutmeg,dill seed   Tomatoes Basil, marjoram, onion   Spice /blend for [employer]" 4 tsp ground thyme 1 tsp ground sage 3 tsp ground rosemary 4 tsp ground marjoram   Test your knowledge 1. A product that says "Salt Free" may still contain sodium. True or False 2. Garlic Powder and Hot Pepper Sauce an be used as alternative seasonings.True or False 3. Processed foods have more sodium than fresh foods.  True or False 4. Canned Vegetables have less sodium than froze True or False  WAYS TO DECREASE YOUR SODIUM INTAKE 1. Avoid the use of added salt in cooking and at the table.  Table salt (and other prepared seasonings which contain salt) is probably one of the greatest sources of sodium in the diet.  Unsalted foods can gain flavor from the sweet, sour, and butter taste sensations of herbs and spices.   Instead of using salt for seasoning, try the following seasonings with the foods listed.  Remember: how you use them to enhance natural food flavors is limited only by your creativity... Allspice-Meat, fish, eggs, fruit, peas, red and yellow vegetables Almond Extract-Fruit baked goods Anise Seed-Sweet breads, fruit, carrots, beets, cottage cheese, cookies (tastes like licorice) Basil-Meat, fish, eggs, vegetables, rice, vegetables salads, soups, sauces Bay Leaf-Meat, fish, stews, poultry Burnet-Salad, vegetables (cucumber-like flavor) Caraway Seed-Bread, cookies, cottage cheese, meat, vegetables, cheese, rice Cardamon-Baked goods, fruit, soups Celery Powder or seed-Salads, salad dressings, sauces, meatloaf, soup, bread.Do not use  celery salt Chervil-Meats, salads, fish, eggs, vegetables, cottage cheese (parsley-like flavor) Chili Power-Meatloaf, chicken cheese, corn, eggplant, egg dishes Chives-Salads cottage cheese, egg dishes, soups, vegetables, sauces Cilantro-Salsa, casseroles Cinnamon-Baked goods, fruit, pork, lamb, chicken, carrots Cloves-Fruit, baked goods, fish, pot roast, green beans, beets, carrots Coriander-Pastry, cookies, meat, salads, cheese (lemon-orange flavor) Cumin-Meatloaf, fish,cheese, eggs, cabbage,fruit pie (caraway flavor) [employer], fruit, eggs, fish, poultry, cottage cheese, vegetables Dill Seed-Meat, cottage cheese, poultry, vegetables, fish, salads, bread Fennel Seed-Bread, cookies, apples, pork, eggs, fish, beets, cabbage, cheese, Licorice-like flavor Garlic-(buds or powder) Salads, meat, poultry, fish, bread, butter, vegetables, potatoes.Do not  use garlic salt Ginger-Fruit, vegetables, baked goods, meat, fish, poultry Horseradish Root-Meet, vegetables, butter Lemon Juice or Extract-Vegetables, fruit, tea, baked goods, fish salads Mace-Baked goods fruit, vegetables, fish, poultry (taste like nutmeg) Maple Extract-Syrups Marjoram-Meat, chicken, fish,  vegetables, breads, green salads (taste like Sage)  Mint-Tea, lamb, sherbet, vegetables, desserts, carrots, cabbage Mustard, Dry or Seed-Cheese, eggs, meats, vegetables, poultry Nutmeg-Baked goods, fruit, chicken, eggs, vegetables, desserts Onion Powder-Meat, fish, poultry, vegetables, cheese, eggs, bread, rice salads (Do not use   Onion salt) Orange Extract-Desserts, baked goods Oregano-Pasta, eggs, cheese, onions, pork, lamb, fish, chicken, vegetables, green salads Paprika-Meat, fish, poultry, eggs, cheese, vegetables Parsley Flakes-Butter, vegetables, meat fish, poultry, eggs, bread, salads (certain forms may   Contain sodium Pepper-Meat fish, poultry, vegetables, eggs Peppermint Extract-Desserts, baked goods Poppy Seed-Eggs, bread, cheese, fruit dressings, baked goods, noodles, vegetables, cottage  [employer], poultry, meat, fish, cauliflower, turnips,eggs bread Saffron-Rice, bread, veal, chicken, fish, eggs Sage-Meat, fish, poultry, onions, eggplant, tomateos, pork, stews Savory-Eggs, salads, poultry, meat, rice, vegetables, soups, pork Tarragon-Meat, poultry, fish, eggs, butter, vegetables (licorice-like flavor)  Thyme-Meat, poultry, fish, eggs, vegetables, (clover-like flavor), sauces, soups Tumeric-Salads, butter, eggs, fish, rice, vegetables (saffron-like flavor) Vanilla Extract-Baked goods, candy Vinegar-Salads, vegetables, meat marinades Walnut Extract-baked goods, candy  2. Choose your Foods Wisely   The following is a list of foods to avoid which are high in sodium:  Meats-Avoid all smoked, canned, salt cured, dried and kosher meat and fish as well as Anchovies   [employer] meats:Bologna, Liverwurst, Pastrami Canned meat or fish  Marinated herring Caviar    Pepperoni Corned Beef   Pizza Dried chipped beef  Salami Frozen breaded fish or meat Salt pork Frankfurters or hot dogs  Sardines Gefilte fish   Sausage Ham  (boiled ham, Proscuitto Smoked butt    spiced ham)   Spam      TV Dinners Vegetables Canned vegetables (Regular) Relish Canned mushrooms  Sauerkraut Olives    Tomato juice Pickles  Bakery and Dessert Products Canned puddings  Cream pies Cheesecake   Decorated cakes Cookies  Beverages/Juices Tomato juice, regular  Gatorade   V-8 vegetable juice, regular  Breads and Cereals Biscuit mixes   Salted potato chips, corn chips, pretzels Bread stuffing mixes  Salted crackers and rolls Pancake and waffle mixes Self-rising flour  Seasonings Accent    Meat sauces Barbecue sauce  Meat tenderizer Catsup    Monosodium glutamate (MSG) Celery salt   Onion salt Chili sauce   Prepared mustard Garlic salt   Salt, seasoned salt, sea salt Gravy mixes   Soy sauce Horseradish   Steak sauce Ketchup   Tartar sauce Lite salt    Teriyaki sauce Marinade mixes   Worcestershire sauce  Others Baking powder   Cocoa and cocoa mixes Baking soda   Commercial casserole mixes Candy-caramels, chocolate  Dehydrated soups    Bars, fudge,nougats  Instant rice and pasta mixes Canned broth or soup  Maraschino cherries Cheese, aged and processed cheese and cheese spreads  Learning Assessment Quiz  Indicated T (for True) or F (for False) for each of the following statements:  1. _____ Fresh fruits and vegetables and unprocessed grains are generally low in sodium 2. _____ Water may contain a considerable amount of sodium, depending on the source 3. _____ You can always tell if a food is high in sodium by tasting it 4. _____ Certain laxatives my be high in sodium and should be avoided unless prescribed   by a physician or pharmacist 5. _____ Salt substitutes may be used freely by anyone on a sodium restricted diet 6. _____ Sodium is present in table salt, food additives and as a natural component of   most foods 7. _____ Table salt is approximately 90% sodium 8.  _____ Limiting sodium intake may help prevent  excess fluid accumulation in the body 9. _____ On a sodium-restricted diet, seasonings such as bouillon soy sauce, and    cooking wine should be used in place of table salt 10. _____ On an ingredient list, a product which lists monosodium glutamate as the first   ingredient is an appropriate food to include on a low sodium diet  Circle the best answer(s) to the following statements (Hint: there may be more than one correct answer)  11. On a low-sodium diet, some acceptable snack items are:    A. Olives  F. Bean dip   K. Grapefruit juice    B. Salted Pretzels G. Commercial Popcorn   L. Canned peaches    C. Carrot Sticks  H. Bouillon   M. Unsalted nuts   D. [place] fries  I. Peanut butter crackers N. Salami   E. Sweet pickles J. Tomato Juice   O. Pizza  12.  Seasonings that may be used freely on a reduced - sodium diet include   A. Lemon wedges F.Monosodium glutamate K. Celery seed    B.Soysauce   G. Pepper   L. Mustard powder   C. Sea salt  H. Cooking wine  M. Onion flakes   D. Vinegar  E. Prepared horseradish N. Salsa   E. Sage   J. Worcestershire sauce  O. Chutney     If you need a refill on your cardiac medications before your next appointment, please call your pharmacy.

## 2018-05-29 ENCOUNTER — Telehealth (HOSPITAL_COMMUNITY): Payer: Self-pay | Admitting: *Deleted

## 2018-05-29 NOTE — Telephone Encounter (Signed)
Patient given detailed instructions per Myocardial Perfusion Study Information Sheet for the test on 05/31/18 at 9:45. Patient notified to arrive 15 minutes early and that it is imperative to arrive on time for appointment to keep from having the test rescheduled.  If you need to cancel or reschedule your appointment, please call the office within 24 hours of your appointment. . Patient verbalized understanding.Kimberly Waters

## 2018-05-29 NOTE — Telephone Encounter (Signed)
Spoke with pt and explained SG's message and she still declined sleep study and stated she is not ready right now to do this. I explained the effects of having sleep apnea with CHF and how the condition could make this condition worse. She still refused and I advised her to call if she changed her mind and wanted to proceed with study. Nothing further is needed.

## 2018-05-31 ENCOUNTER — Ambulatory Visit (HOSPITAL_COMMUNITY): Payer: Medicare Other | Attending: Internal Medicine

## 2018-05-31 DIAGNOSIS — I10 Essential (primary) hypertension: Secondary | ICD-10-CM | POA: Diagnosis not present

## 2018-05-31 DIAGNOSIS — R079 Chest pain, unspecified: Secondary | ICD-10-CM | POA: Diagnosis not present

## 2018-05-31 DIAGNOSIS — E785 Hyperlipidemia, unspecified: Secondary | ICD-10-CM | POA: Diagnosis not present

## 2018-05-31 DIAGNOSIS — R0609 Other forms of dyspnea: Secondary | ICD-10-CM | POA: Insufficient documentation

## 2018-05-31 MED ORDER — TECHNETIUM TC 99M TETROFOSMIN IV KIT
30.6000 | PACK | Freq: Once | INTRAVENOUS | Status: AC | PRN
  Administered 2018-05-31: 30.6 via INTRAVENOUS
  Filled 2018-05-31: qty 31

## 2018-05-31 MED ORDER — TECHNETIUM TC 99M TETROFOSMIN IV KIT
10.6000 | PACK | Freq: Once | INTRAVENOUS | Status: AC | PRN
  Administered 2018-05-31: 10.6 via INTRAVENOUS
  Filled 2018-05-31: qty 11

## 2018-05-31 MED ORDER — REGADENOSON 0.4 MG/5ML IV SOLN
0.4000 mg | Freq: Once | INTRAVENOUS | Status: AC
  Administered 2018-05-31: 0.4 mg via INTRAVENOUS

## 2018-06-01 LAB — MYOCARDIAL PERFUSION IMAGING
CHL CUP NUCLEAR SRS: 4
CHL CUP NUCLEAR SSS: 9
LHR: 0.41
LV sys vol: 38 mL
LVDIAVOL: 94 mL (ref 46–106)
NUC STRESS TID: 1.05
Peak HR: 82 {beats}/min
Rest HR: 71 {beats}/min
SDS: 5

## 2018-06-03 ENCOUNTER — Telehealth: Payer: Self-pay

## 2018-06-03 NOTE — Telephone Encounter (Signed)
-----   Message from Imogene Burn, PA-C sent at 06/03/2018  9:28 AM EDT ----- Normal nuclear stress test.  No ischemia.

## 2018-06-03 NOTE — Telephone Encounter (Signed)
Pt is aware and agreeable to normal stress test results.

## 2018-06-04 ENCOUNTER — Other Ambulatory Visit: Payer: Self-pay | Admitting: Oncology

## 2018-06-16 ENCOUNTER — Encounter: Payer: Self-pay | Admitting: Internal Medicine

## 2018-06-16 NOTE — Progress Notes (Signed)
pft  

## 2018-06-20 DIAGNOSIS — L299 Pruritus, unspecified: Secondary | ICD-10-CM | POA: Diagnosis not present

## 2018-07-12 ENCOUNTER — Ambulatory Visit (INDEPENDENT_AMBULATORY_CARE_PROVIDER_SITE_OTHER): Payer: Medicare Other | Admitting: Cardiology

## 2018-07-12 ENCOUNTER — Encounter: Payer: Self-pay | Admitting: Cardiology

## 2018-07-12 VITALS — BP 108/68 | HR 94 | Ht 62.0 in | Wt 198.0 lb

## 2018-07-12 DIAGNOSIS — I10 Essential (primary) hypertension: Secondary | ICD-10-CM | POA: Diagnosis not present

## 2018-07-12 DIAGNOSIS — R079 Chest pain, unspecified: Secondary | ICD-10-CM | POA: Diagnosis not present

## 2018-07-12 DIAGNOSIS — I5032 Chronic diastolic (congestive) heart failure: Secondary | ICD-10-CM

## 2018-07-12 DIAGNOSIS — E782 Mixed hyperlipidemia: Secondary | ICD-10-CM

## 2018-07-12 DIAGNOSIS — J45909 Unspecified asthma, uncomplicated: Secondary | ICD-10-CM | POA: Diagnosis not present

## 2018-07-12 NOTE — Progress Notes (Signed)
Cardiology Office Note:    Date:  07/12/2018   ID:  Kimberly Waters, DOB [birth year], MRN [id number]  PCP:  Josetta Huddle, MD  Cardiologist:  No primary care provider on file.   Referring MD: Josetta Huddle, MD     History of Present Illness:    Kimberly Waters is a 72 y.o. female with chronic diastolic heart failure, essential hypertension hyperlipidemia persistent asthma with recent chest tightness with reassuring nuclear stress test here for follow-up.  Previous visit with Estella Husk, PA.  Chest pain previously was fairly atypical but she was getting relief from nitroglycerin that she took from her mother.  Reassuring stress test as above.  Overall she has been doing quite well with her weight.  She knows not to eat salt.  She has not been having any significant heart failure-like symptoms.  Denies any syncope bleeding orthopnea PND.  Baseline shortness of breath.  Past Medical History:  Diagnosis Date  . Allergy   . Breast cancer ([place])   . Colon polyps   . Depression   . Dyspnea    SEASONAL  . Family history of breast cancer   . Family history of colon cancer   . GERD (gastroesophageal reflux disease)   . History of radiation therapy 06/05/17-07/20/17   right chest wall 50.4 Gy in 28 fractions, right axillary nodal region 45 Gy in 25 fractions  . Hyperlipidemia   . Hypertension   . Hypothyroidism   . PONV (postoperative nausea and vomiting)     Past Surgical History:  Procedure Laterality Date  . ABDOMINAL HYSTERECTOMY    . KNEE SURGERY    . MASTECTOMY W/ SENTINEL NODE BIOPSY Right 03/05/2017   Procedure: BILATERAL TOTAL MASTECTOMIES WITH RIGHT SENTINEL LYMPH NODE BIOPSIES;  Surgeon: Excell Seltzer, MD;  Location: [facility];  Service: General;  Laterality: Right;  . SHOULDER SURGERY     BILAT    Current Medications: Current Meds  Medication Sig  . albuterol (PROVENTIL,VENTOLIN) 90 MCG/ACT inhaler Inhale 2 puffs into the lungs every 4 (four) hours as needed.  (Patient taking differently: Inhale 2 puffs into the lungs every 4 (four) hours as needed for wheezing or shortness of breath. )  . anastrozole (ARIMIDEX) 1 MG tablet TAKE 1 TABLET BY MOUTH EVERY DAY  . aspirin EC 81 MG EC tablet Take 1 tablet (81 mg total) by mouth daily.  . budesonide-formoterol (SYMBICORT) 80-4.5 MCG/ACT inhaler Inhale 2 puffs into the lungs 2 (two) times daily.  . chlorpheniramine (CHLOR-TRIMETON) 4 MG tablet Take 4 mg by mouth 2 (two) times daily as needed for allergies.  . diphenhydrAMINE (BENADRYL) 25 MG tablet Take 25 mg by mouth as needed for sleep.  Marland Kitchen escitalopram (LEXAPRO) 10 MG tablet Take 1 tablet (10 mg total) by mouth daily.  . furosemide (LASIX) 40 MG tablet Take 1 tablet (40 mg total) by mouth daily.  Marland Kitchen ipratropium (ATROVENT) 0.06 % nasal spray Place 2 sprays into the nose 4 (four) times daily.  Marland Kitchen levothyroxine (SYNTHROID, LEVOTHROID) 112 MCG tablet TAKE ONE TABLET BY MOUTH ONCE DAILY (Patient taking differently: Take 112 mcg by mouth daily before breakfast. )  . lisinopril (PRINIVIL,ZESTRIL) 2.5 MG tablet Take 1 tablet (2.5 mg total) by mouth daily.  . metoprolol succinate (TOPROL-XL) 25 MG 24 hr tablet Take 25 mg by mouth daily.   Marland Kitchen omeprazole (PRILOSEC) 20 MG capsule Take 20 mg by mouth daily.  . potassium chloride SA (K-DUR,KLOR-CON) 20 MEQ tablet Take 1 tablet (20 mEq total)  by mouth daily.  . simvastatin (ZOCOR) 20 MG tablet Take 1 tablet (20 mg total) by mouth at bedtime.  . Tiotropium Bromide Monohydrate (SPIRIVA RESPIMAT) 1.25 MCG/ACT AERS Inhale 2 puffs into the lungs 2 (two) times daily.     Allergies:   Adhesive [tape]; Codeine; and Other   Social History   Socioeconomic History  . Marital status: Married    Spouse name: Not on file  . Number of children: 2  . Years of education: Not on file  . Highest education level: Not on file  Occupational History  . Occupation: reitred [employer]  . Financial resource  strain: Not on file  . Food insecurity:    Worry: Not on file    Inability: Not on file  . Transportation needs:    Medical: Not on file    Non-medical: Not on file  Tobacco Use  . Smoking status: Former Smoker    Packs/day: 1.00    Years: 45.00    Pack years: 45.00    Types: Cigarettes    Last attempt to quit: 02/22/2017    Years since quitting: 1.3  . Smokeless tobacco: Never Used  Substance and Sexual Activity  . Alcohol use: No    Alcohol/week: 0.0 oz  . Drug use: No  . Sexual activity: Not on file  Lifestyle  . Physical activity:    Days per week: Not on file    Minutes per session: Not on file  . Stress: Not on file  Relationships  . Social connections:    Talks on phone: Not on file    Gets together: Not on file    Attends religious service: Not on file    Active member of club or organization: Not on file    Attends meetings of clubs or organizations: Not on file    Relationship status: Not on file  Other Topics Concern  . Not on file  Social History Narrative  . Not on file     Family History: The patient's family history includes Breast cancer (age of onset: 46) in her cousin; Breast cancer (age of onset: 74) in her paternal aunt; Colon cancer in her maternal uncle and maternal uncle; Colon cancer (age of onset: 26) in her maternal grandfather; Colon cancer (age of onset: 55) in her maternal aunt; Head & neck cancer in her maternal uncle; Heart disease (age of onset: 56) in her mother; Lung cancer in her paternal grandfather.  ROS:   Please see the history of present illness.     All other systems reviewed and are negative.  EKGs/Labs/Other Studies Reviewed:    The following studies were reviewed today:  2D echo 03/29/2018 Study Conclusions  - Left ventricle: The cavity size was normal. There was moderate concentric hypertrophy. Systolic function was normal. The estimated ejection fraction was in the range of 55% to 60%. Wall motion was normal;  there were no regional wall motion abnormalities. Doppler parameters are consistent with abnormal left ventricular relaxation (grade 1 diastolic dysfunction). - Aortic valve: There was no regurgitation. - Mitral valve: Transvalvular velocity was within the normal range. There was no evidence for stenosis. There was mild regurgitation. - Left atrium: The atrium was mildly dilated. - Right ventricle: The cavity size was normal. Wall thickness was normal. Systolic function was normal. - Tricuspid valve: There was trivial regurgitation. - Pulmonary arteries: Systolic pressure was within the normal range. PA peak pressure: 29 mm Hg (S).  NUC stress  05/31/18  Nuclear stress EF: 59%.  Inferior and apical thnning consistent with prabable soft tissue attenuation and normal perfusion No significant ischemia or scar  Low risk   EKG:  Prior NSR  Recent Labs: 03/29/2018: Pro B Natriuretic peptide (BNP) 221.0 04/01/2018: B Natriuretic Peptide 341.2; TSH 3.977 04/04/2018: Magnesium 2.1 05/27/2018: ALT 25; BUN 19; Creatinine, Ser 1.39; Hemoglobin 12.9; Platelets 266; Potassium 3.5; Sodium 138  Recent Lipid Panel    Component Value Date/Time   CHOL 188 12/09/2008 2139   TRIG 216 (H) 12/09/2008 2139   HDL 39 (L) 12/09/2008 2139   CHOLHDL 4.8 Ratio 12/09/2008 2139   VLDL 43 (H) 12/09/2008 2139   LDLCALC 106 (H) 12/09/2008 2139   LDLDIRECT 120 (H) 02/12/2013 1536    Physical Exam:    VS:  BP 108/68   Pulse 94   Ht 5\' 2"  (1.575 m)   Wt 198 lb (89.8 kg)   SpO2 93%   BMI 36.21 kg/m     Wt Readings from Last 3 Encounters:  07/12/18 198 lb (89.8 kg)  05/31/18 194 lb (88 kg)  05/28/18 194 lb 1.9 oz (88.1 kg)     GEN:  Well nourished, well developed in no acute distress, obese HEENT: Normal NECK: No JVD; No carotid bruits LYMPHATICS: No lymphadenopathy CARDIAC: RRR, no murmurs, rubs, gallops RESPIRATORY:  Clear to auscultation without rales, wheezing or rhonchi  ABDOMEN: Soft,  non-tender, non-distended MUSCULOSKELETAL:  No edema; No deformity  SKIN: Warm and dry NEUROLOGIC:  Alert and oriented x 3 PSYCHIATRIC:  Normal affect   ASSESSMENT:    1. Chest pain, unspecified type   2. Chronic diastolic congestive heart failure ([place])   3. HYPERTENSION, BENIGN SYSTEMIC   4. Mixed hyperlipidemia   5. Asthma, unspecified asthma severity, unspecified whether complicated, unspecified whether persistent    PLAN:    In order of problems listed above:  Chronic diastolic heart failure - Actually only grade 1 diastolic dysfunction which is expected pretty much for age.  Diuresed however in the hospital 4 L and feels better.  On lisinopril Lasix beta-blocker.  Low-sodium diet.  Weight loss.  Stable.  Doing well.  Continue to monitor weights daily.  We are going to check a basic metabolic profile today.  Creatinine was slightly increased at last check in June.  She is on Lasix.  Essential hypertension - Overall doing quite well.  Medications reviewed.  Atypical chest pain - Nuclear stress test reassuring, no ischemia.  Mixed hyperlipidemia -On Zocor.  Overall been doing quite well.  No myalgias.  Persistent asthma -Followed by pulmonary medicine.  May be a reason for tachycardia previously seen.  Medication Adjustments/Labs and Tests Ordered: Current medicines are reviewed at length with the patient today.  Concerns regarding medicines are outlined above.  Orders Placed This Encounter  Procedures  . Basic metabolic panel   No orders of the defined types were placed in this encounter.   Patient Instructions  Medication Instructions:  The current medical regimen is effective;  continue present plan and medications.  Labwork: Please have blood work today.  Follow-Up: Follow up in 6 months with Ermalinda Barrios, PA.  You will receive a letter in the mail 2 months before you are due.  Please call us when you receive this letter to schedule your follow up  appointment.  Follow up in 1 year with Dr. Marlou Porch.  You will receive a letter in the mail 2 months before you are due.  Please call us when  you receive this letter to schedule your follow up appointment.  Thank you for choosing [facility]!!        If you need a refill on your cardiac medications before your next appointment, please call your pharmacy.      Signed, Candee Furbish, MD  07/12/2018 4:22 PM    [facility]

## 2018-07-12 NOTE — Patient Instructions (Signed)
Medication Instructions:  The current medical regimen is effective;  continue present plan and medications.  Labwork: Please have blood work today.  Follow-Up: Follow up in 6 months with Ermalinda Barrios, PA.  You will receive a letter in the mail 2 months before you are due.  Please call us when you receive this letter to schedule your follow up appointment.  Follow up in 1 year with Dr. Marlou Porch.  You will receive a letter in the mail 2 months before you are due.  Please call us when you receive this letter to schedule your follow up appointment.  Thank you for choosing [facility]!!        If you need a refill on your cardiac medications before your next appointment, please call your pharmacy.

## 2018-07-13 LAB — BASIC METABOLIC PANEL
BUN / CREAT RATIO: 17 (ref 12–28)
BUN: 18 mg/dL (ref 8–27)
CO2: 26 mmol/L (ref 20–29)
CREATININE: 1.03 mg/dL — AB (ref 0.57–1.00)
Calcium: 9.1 mg/dL (ref 8.7–10.3)
Chloride: 100 mmol/L (ref 96–106)
GFR calc Af Amer: 64 mL/min/{1.73_m2} (ref >59)
GFR, EST NON AFRICAN AMERICAN: 55 mL/min/{1.73_m2} — AB (ref >59)
Glucose: 101 mg/dL — ABNORMAL HIGH (ref 65–99)
Potassium: 3.7 mmol/L (ref 3.5–5.2)
Sodium: 144 mmol/L (ref 134–144)

## 2018-08-01 DIAGNOSIS — L299 Pruritus, unspecified: Secondary | ICD-10-CM | POA: Diagnosis not present

## 2018-09-17 ENCOUNTER — Encounter: Payer: Self-pay | Admitting: Internal Medicine

## 2018-09-17 ENCOUNTER — Ambulatory Visit (INDEPENDENT_AMBULATORY_CARE_PROVIDER_SITE_OTHER): Payer: Medicare Other | Admitting: Internal Medicine

## 2018-09-17 VITALS — BP 126/72 | HR 77 | Ht 62.0 in | Wt 197.2 lb

## 2018-09-17 DIAGNOSIS — J449 Chronic obstructive pulmonary disease, unspecified: Secondary | ICD-10-CM

## 2018-09-17 DIAGNOSIS — J441 Chronic obstructive pulmonary disease with (acute) exacerbation: Secondary | ICD-10-CM | POA: Diagnosis not present

## 2018-09-17 DIAGNOSIS — Z23 Encounter for immunization: Secondary | ICD-10-CM

## 2018-09-17 MED ORDER — PREDNISONE 10 MG PO TABS: ORAL_TABLET | ORAL | 0 refills | Status: DC

## 2018-09-17 MED ORDER — TIOTROPIUM BROMIDE MONOHYDRATE 2.5 MCG/ACT IN AERS: 2.0000 | INHALATION_SPRAY | Freq: Every day | RESPIRATORY_TRACT | 0 refills | Status: DC

## 2018-09-17 MED ORDER — IPRATROPIUM BROMIDE 0.06 % NA SOLN: 2.0000 | Freq: Four times a day (QID) | NASAL | 3 refills | Status: DC

## 2018-09-17 MED ORDER — TIOTROPIUM BROMIDE MONOHYDRATE 2.5 MCG/ACT IN AERS: 2.0000 | INHALATION_SPRAY | Freq: Every day | RESPIRATORY_TRACT | 11 refills | Status: DC

## 2018-09-17 MED ORDER — BUDESONIDE-FORMOTEROL FUMARATE 80-4.5 MCG/ACT IN AERO: 2.0000 | INHALATION_SPRAY | Freq: Two times a day (BID) | RESPIRATORY_TRACT | 12 refills | Status: DC

## 2018-09-17 MED ORDER — BUDESONIDE-FORMOTEROL FUMARATE 80-4.5 MCG/ACT IN AERO: 2.0000 | INHALATION_SPRAY | Freq: Two times a day (BID) | RESPIRATORY_TRACT | 11 refills | Status: DC

## 2018-09-17 NOTE — Addendum Note (Signed)
Addended by: Lorretta Harp on: 09/17/2018 11:59 AM   Modules accepted: Orders

## 2018-09-17 NOTE — Addendum Note (Signed)
Addended by: Lorretta Harp on: 09/17/2018 12:12 PM   Modules accepted: Orders

## 2018-09-17 NOTE — Progress Notes (Signed)
HPI  IOV 03/08/2018  Chief Complaint  Patient presents with  . Pulmonary Consult    dyspnea since she had breast cancer last year, very fatigue, itchy skin     72 year old obese lady with a previous 45 pack smoking history quit 1 year ago when she had double mastectomy for early stage breast cancer.  She tells me for the last few years even preceding her mastectomy she has had insidious onset of shortness of breath that is slowly progressive.  Brought on by climbing 1 flight of stairs and relieved by rest.  No associated chest pain.  There is associated cough and wheezing although not associated with dyspnea per se but happens randomly at night and in the daytime.  Albuterol inhaler helps with the cough and the wheezing.  Associated with the cough and the wheezing is lisinopril intake.  She does not have any orthopnea proximal nocturnal dyspnea.  The dyspnea happens purely at exertion and relieved by rest.  And it is progressive.  In 2016 she had low-dose CT Scan of the chest for lung cancer screening and this did not show any lung cancer.  Follow-up was recommended in 1 year.  It did show evidence of emphysema but she is unaware of this diagnosis.  She has a history of radiation therapy   05/28/2018 Pt. Presents for follow up. She was admitted to the hospital 4 days after being seen in the office on 03/28/2018. Admit 04/01/2018>>  Discharged 04/04/2018. She was admitted for pulmonary edema and acute diastolic HF exacerbation.  She has a multifactorial process consisting of diastolic heart failure exacerbation superimposed on underlying COPD/obesity as well as anxiety. She was diuresed for 4 L of fluid. Echo done prior to admission this admission  showed 55-60% EF with grade 1 diastolic dysfunction.PA peak pressure was 29 mmHg.She was started on ASA, Lisinopril 2.5 mg daily and BB, with recommendation of close cardiology follow up.       Pt.  states she is much better since admission. She is weighing  herself daily, and knows to call her PCP/ cards for weight gain.  She was discharged on a prednisone taper, which she states she completed. She is compliant with her  Symbicort, and Spiriva. Both   Have worked  well for her, and she has much less dyspnea,  but she cannot afford them.We checked with her pharmacy and she is in her donut hole.We will send her some with samples as able. Her dose of Lexapro was increased to 10 mg daily for her anxiety and depression. She was noted to have normocytic anemia without blood loss, so most  likely due to chronic disease. She denies fever, chest pain, orthopnea or hemoptysis.   Body mass index is 35.37 kg/m.>> encouraged to work on weight loss.  Test Results: Echo 04/2018>> EF 26-71%, grade 1 diastolic dysfunction PAP 29 mm Hg  PFT's 4/3//2019>> FVC: 1.62, 2.75, 58% FEV1 1.09, 2.07, 52% F/F Ratio: 67,76, 88% SVC 1.83,2.75,66% TLC 88% DLCO: 46%  HRCT 03/26/2018 IMPRESSION: 1. No definitive evidence of interstitial lung disease. 2. Mild pulmonary edema and small right pleural effusion. 3. Aortic atherosclerosis (ICD10-170.0). Coronary artery calcification. 4. Emphysema (ICD10-J43.9). 5. Hepatic steatosis. 6. Left adrenal adenoma.      OV 09/17/2018  Subjective:  Patient ID: Kimberly Waters, female , DOB: 1946/04/22 , age 30 y.o. , MRN: [id number] , ADDRESS: [place] [id number]   09/17/2018 -   Chief Complaint  Patient presents  with  . Follow-up    Pt states she has been okay since last visit. States she has had SOB with exertion, occ cough, and occ CP.     HPI Kimberly Waters 72 y.o. -presents for follow-up of emphysema.  She has gold stage II COPD based on April 2019 PFT.  After I saw her she had an admission for diastolic heart failure.  She then saw a nurse practitioner who reviewed a CT scan of the chest and placed her on Spiriva and Symbicort.  She tells me that she took a Spiriva and Symbicort but in the  last month because she is taking care of her dying mom she became noncompliant with these inhalers and is not taking it.  As a result her symptoms have deteriorated with increased cough congestion and shortness of breath which is rated as class III on exertion relieved by rest.  She feels prednisone will help her.  She has not had a flu shot but will take it with her primary care physician.  Review of the chart shows that she needs Pneumovax and  Prevnar vaccines.  We discussed pulmonary rehabilitation but she is not interested at this point because of her dying mom.      CAT COPD Symptom & Quality of Life Score (GSK trademark) 0 is no burden. 5 is highest burden 09/17/2018   Never Cough -> Cough all the time 3  No phlegm in chest -> Chest is full of phlegm 3  No chest tightness -> Chest feels very tight 3  No dyspnea for 1 flight stairs/hill -> Very dyspneic for 1 flight of stairs 5  No limitations for ADL at home -> Very limited with ADL at home 5  Confident leaving home -> Not at all confident leaving home 3  Sleep soundly -> Do not sleep soundly because of lung condition 3  Lots of Energy -> No energy at all 5  TOTAL Score (max 40)  30      ROS - per HPI     has a past medical history of Allergy, Breast cancer ([place]), Colon polyps, Depression, Dyspnea, Family history of breast cancer, Family history of colon cancer, GERD (gastroesophageal reflux disease), History of radiation therapy (06/05/17-07/20/17), Hyperlipidemia, Hypertension, Hypothyroidism, and PONV (postoperative nausea and vomiting).   reports that she quit smoking about 18 months ago. Her smoking use included cigarettes. She has a 45.00 pack-year smoking history. She has never used smokeless tobacco.  Past Surgical History:  Procedure Laterality Date  . ABDOMINAL HYSTERECTOMY    . KNEE SURGERY    . MASTECTOMY W/ SENTINEL NODE BIOPSY Right 03/05/2017   Procedure: BILATERAL TOTAL MASTECTOMIES WITH RIGHT SENTINEL LYMPH  NODE BIOPSIES;  Surgeon: Excell Seltzer, MD;  Location: [facility];  Service: General;  Laterality: Right;  . SHOULDER SURGERY     BILAT    Allergies  Allergen Reactions  . Adhesive [Tape] Other (See Comments)    (skin tears)  Tolerates PAPER TAPE  . Codeine Nausea Only  . Other Nausea Only    Anesthesia--nausea     Immunization History  Administered Date(s) Administered  . Influenza Whole 12/03/2007, 12/16/2009  . Influenza-Unspecified 09/25/2015  . Pneumococcal Polysaccharide-23 01/11/2010  . Td 02/28/2006    Family History  Problem Relation Age of Onset  . Heart disease Mother 6       stents  . Colon cancer Maternal Grandfather 2  . Colon cancer Maternal Aunt 47  . Colon cancer Maternal Uncle  dx in his 65s  . Breast cancer Paternal Aunt 60  . Breast cancer Cousin 51       paternal first cousin  . Lung cancer Paternal Grandfather   . Colon cancer Maternal Uncle   . Head & neck cancer Maternal Uncle        oral cancer     Current Outpatient Medications:  .  albuterol (PROVENTIL,VENTOLIN) 90 MCG/ACT inhaler, Inhale 2 puffs into the lungs every 4 (four) hours as needed. (Patient taking differently: Inhale 2 puffs into the lungs every 4 (four) hours as needed for wheezing or shortness of breath. ), Disp: 17 g, Rfl: 3 .  anastrozole (ARIMIDEX) 1 MG tablet, TAKE 1 TABLET BY MOUTH EVERY DAY, Disp: 90 tablet, Rfl: 3 .  aspirin EC 81 MG EC tablet, Take 1 tablet (81 mg total) by mouth daily., Disp: , Rfl:  .  diphenhydrAMINE (BENADRYL) 25 MG tablet, Take 25 mg by mouth as needed for sleep., Disp: , Rfl:  .  escitalopram (LEXAPRO) 10 MG tablet, Take 1 tablet (10 mg total) by mouth daily., Disp: 30 tablet, Rfl: 0 .  furosemide (LASIX) 40 MG tablet, Take 1 tablet (40 mg total) by mouth daily., Disp: 30 tablet, Rfl: 0 .  imipramine (TOFRANIL) 50 MG tablet, Take 50 mg by mouth at bedtime., Disp: , Rfl:  .  ipratropium (ATROVENT) 0.06 % nasal spray, Place 2 sprays into the  nose 4 (four) times daily., Disp: , Rfl: 0 .  levothyroxine (SYNTHROID, LEVOTHROID) 112 MCG tablet, TAKE ONE TABLET BY MOUTH ONCE DAILY (Patient taking differently: Take 112 mcg by mouth daily before breakfast. ), Disp: 30 tablet, Rfl: 0 .  lisinopril (PRINIVIL,ZESTRIL) 2.5 MG tablet, Take 1 tablet (2.5 mg total) by mouth daily., Disp: 30 tablet, Rfl: 0 .  metoprolol succinate (TOPROL-XL) 25 MG 24 hr tablet, Take 25 mg by mouth daily. , Disp: , Rfl:  .  omeprazole (PRILOSEC) 20 MG capsule, Take 20 mg by mouth daily., Disp: , Rfl:  .  potassium chloride SA (K-DUR,KLOR-CON) 20 MEQ tablet, Take 1 tablet (20 mEq total) by mouth daily., Disp: 30 tablet, Rfl: 0 .  simvastatin (ZOCOR) 20 MG tablet, Take 1 tablet (20 mg total) by mouth at bedtime., Disp: 90 tablet, Rfl: 0 .  budesonide-formoterol (SYMBICORT) 80-4.5 MCG/ACT inhaler, Inhale 2 puffs into the lungs 2 (two) times daily. (Patient not taking: Reported on 09/17/2018), Disp: 1 Inhaler, Rfl: 5 .  chlorpheniramine (CHLOR-TRIMETON) 4 MG tablet, Take 4 mg by mouth 2 (two) times daily as needed for allergies., Disp: , Rfl:  .  imipramine (TOFRANIL) 50 MG tablet, Take 4 tablets (200 mg total) by mouth at bedtime., Disp: 360 tablet, Rfl: 0 .  Tiotropium Bromide Monohydrate (SPIRIVA RESPIMAT) 1.25 MCG/ACT AERS, Inhale 2 puffs into the lungs 2 (two) times daily. (Patient not taking: Reported on 09/17/2018), Disp: 1 Inhaler, Rfl: 5      Objective:   Vitals:   09/17/18 1130  BP: 126/72  Pulse: 77  SpO2: 97%  Weight: 197 lb 3.2 oz (89.4 kg)  Height: 5\' 2"  (1.575 m)    Estimated body mass index is 36.07 kg/m as calculated from the following:   Height as of this encounter: 5\' 2"  (1.575 m).   Weight as of this encounter: 197 lb 3.2 oz (89.4 kg).  @WEIGHTCHANGE @  [employer]   09/17/18 1130  Weight: 197 lb 3.2 oz (89.4 kg)     Physical Exam  General Appearance:    Alert,  cooperative, no distress, appears stated age - yes , sitting on - chair ,  Deconditioned looking - mild yes  Head:    Normocephalic, without obvious abnormality, atraumatic  Eyes:    PERRL, conjunctiva/corneas clear,  Ears:    Normal TM's and external ear canals, both ears  Nose:   Nares normal, septum midline, mucosa normal, no drainage    or sinus tenderness. OXYGEN ON  - no . Patient is @ ra   Throat:   Lips, mucosa, and tongue normal; teeth and gums normal. Cyanosis on lips - no  Neck:   Supple, symmetrical, trachea midline, no adenopathy;    thyroid:  no enlargement/tenderness/nodules; no carotid   bruit or JVD  Back:     Symmetric, no curvature, ROM normal, no CVA tenderness  Lungs:     Distress - no , Wheeze no, Barrell Chest - no, Purse lip breathing - no, Crackles - no   Chest Wall:    No tenderness or deformity. Scars in chest no   Heart:    Regular rate and rhythm, S1 and S2 normal, no rub   or gallop, Murmur - no  Breast Exam:    NOT DONE  Abdomen:     Soft, non-tender, bowel sounds active all four quadrants,    no masses, no organomegaly  Genitalia:   NOT DONE  Rectal:   NOT DONE  Extremities:   Extremities normal, atraumatic, Clubbing - no, Edema - no  Pulses:   2+ and symmetric all extremities  Skin:   Stigmata of Connective Tissue Disease - no  Lymph nodes:   Cervical, supraclavicular, and axillary nodes normal  Psychiatric:  Neurologic:   nL  CAm-ICU - neg, Alert and Oriented x 3 - yes, Moves all 4s - yes, Speech - normal, Cognition - normal           Assessment:       ICD-10-CM   1. COPD with acute exacerbation ([place]) J44.1   2. Stage 2 moderate COPD by GOLD classification ([place]) J44.9        Plan:     Patient Instructions  COPD with acute exacerbation ([place]) Stage 2 moderate COPD by GOLD classification ([place])  - you are in some flare up due to running out of maintenance inhalers 1 month ago  Plan - Please take prednisone 40 mg x1 day, then 30 mg x1 day, then 20 mg x1 day, then 10 mg x1 day, and then 5 mg x1 day and  stop -Restart Spiriva and Symbicort scheduled -take refill and samples -Flu shot deferred today but have it next month with primary care physician -Talk to primary care physician and stop lisinopril which is aggravating her cough -Have Prevnar vaccine today -second type of pneumonia vaccine -We discussed pulmonary rehabilitation and at this time because your mom is dying it is not a good fit for you but we will refer in the future -Best wishes and support of your mom  Follow-up 4 to 6 months with Dr. Chase Caller or nurse practitioner at pulmonary clinic; CAT score at follow-up Get alpha-1 next visit  > 50% of this > 25 min visit spent in face to face counseling or coordination of care - by this undersigned MD - Dr Brand Males. This includes one or more of the following documented above: discussion of test results, diagnostic or treatment recommendations, prognosis, risks and benefits of management options, instructions, education, compliance or risk-factor reduction    SIGNATURE    Dr.  Brand Males, M.D., F.C.C.P,  Pulmonary and Critical Care Medicine Staff Physician, [facility] Director - Interstitial Lung Disease  Program  P[facility] [place], [place], [address]  Pager: [phone], If no answer or between  15:00h - 7:00h: call [phone] Telephone: [phone]  11:55 AM 09/17/2018

## 2018-09-17 NOTE — Patient Instructions (Addendum)
COPD with acute exacerbation ([facility]) Stage 2 moderate COPD by GOLD classification ([place])  - you are in some flare up due to running out of maintenance inhalers 1 month ago  Plan - Please take prednisone 40 mg x1 day, then 30 mg x1 day, then 20 mg x1 day, then 10 mg x1 day, and then 5 mg x1 day and stop -Restart Spiriva and Symbicort scheduled -take refill and samples -Flu shot deferred today but have it next month with primary care physician -Talk to primary care physician and stop lisinopril which is aggravating her cough -Have Prevnar vaccine today -second type of pneumonia vaccine -We discussed pulmonary rehabilitation and at this time because your mom is dying it is not a good fit for you but we will refer in the future -Best wishes and support of your mom  Follow-up 4 to 6 months with Dr. Chase Caller or nurse practitioner at pulmonary clinic; CAT score at follow-up Get alpha-1 next visit

## 2018-09-19 ENCOUNTER — Other Ambulatory Visit: Payer: Self-pay | Admitting: Physician Assistant

## 2018-09-19 DIAGNOSIS — L308 Other specified dermatitis: Secondary | ICD-10-CM | POA: Diagnosis not present

## 2018-09-19 DIAGNOSIS — L300 Nummular dermatitis: Secondary | ICD-10-CM | POA: Diagnosis not present

## 2018-10-17 DIAGNOSIS — C50911 Malignant neoplasm of unspecified site of right female breast: Secondary | ICD-10-CM | POA: Diagnosis not present

## 2018-10-17 DIAGNOSIS — Z170 Estrogen receptor positive status [ER+]: Secondary | ICD-10-CM | POA: Diagnosis not present

## 2018-10-21 DIAGNOSIS — L300 Nummular dermatitis: Secondary | ICD-10-CM | POA: Diagnosis not present

## 2018-11-22 DIAGNOSIS — L03113 Cellulitis of right upper limb: Secondary | ICD-10-CM | POA: Diagnosis not present

## 2018-11-28 DIAGNOSIS — I951 Orthostatic hypotension: Secondary | ICD-10-CM | POA: Diagnosis not present

## 2018-11-28 DIAGNOSIS — R2681 Unsteadiness on feet: Secondary | ICD-10-CM | POA: Diagnosis not present

## 2018-11-28 DIAGNOSIS — Z23 Encounter for immunization: Secondary | ICD-10-CM | POA: Diagnosis not present

## 2018-11-28 DIAGNOSIS — I10 Essential (primary) hypertension: Secondary | ICD-10-CM | POA: Diagnosis not present

## 2018-11-28 DIAGNOSIS — T887XXA Unspecified adverse effect of drug or medicament, initial encounter: Secondary | ICD-10-CM | POA: Diagnosis not present

## 2018-12-09 DIAGNOSIS — T887XXA Unspecified adverse effect of drug or medicament, initial encounter: Secondary | ICD-10-CM | POA: Diagnosis not present

## 2018-12-09 DIAGNOSIS — R2681 Unsteadiness on feet: Secondary | ICD-10-CM | POA: Diagnosis not present

## 2018-12-09 DIAGNOSIS — I951 Orthostatic hypotension: Secondary | ICD-10-CM | POA: Diagnosis not present

## 2018-12-09 DIAGNOSIS — F322 Major depressive disorder, single episode, severe without psychotic features: Secondary | ICD-10-CM | POA: Diagnosis not present

## 2018-12-09 DIAGNOSIS — I10 Essential (primary) hypertension: Secondary | ICD-10-CM | POA: Diagnosis not present

## 2019-01-13 DIAGNOSIS — C50919 Malignant neoplasm of unspecified site of unspecified female breast: Secondary | ICD-10-CM | POA: Diagnosis not present

## 2019-01-13 DIAGNOSIS — R2681 Unsteadiness on feet: Secondary | ICD-10-CM | POA: Diagnosis not present

## 2019-01-13 DIAGNOSIS — I10 Essential (primary) hypertension: Secondary | ICD-10-CM | POA: Diagnosis not present

## 2019-01-13 DIAGNOSIS — R202 Paresthesia of skin: Secondary | ICD-10-CM | POA: Diagnosis not present

## 2019-01-13 DIAGNOSIS — K219 Gastro-esophageal reflux disease without esophagitis: Secondary | ICD-10-CM | POA: Diagnosis not present

## 2019-01-13 DIAGNOSIS — J439 Emphysema, unspecified: Secondary | ICD-10-CM | POA: Diagnosis not present

## 2019-01-13 DIAGNOSIS — T887XXA Unspecified adverse effect of drug or medicament, initial encounter: Secondary | ICD-10-CM | POA: Diagnosis not present

## 2019-01-13 DIAGNOSIS — I509 Heart failure, unspecified: Secondary | ICD-10-CM | POA: Diagnosis not present

## 2019-01-13 DIAGNOSIS — F322 Major depressive disorder, single episode, severe without psychotic features: Secondary | ICD-10-CM | POA: Diagnosis not present

## 2019-01-13 DIAGNOSIS — E039 Hypothyroidism, unspecified: Secondary | ICD-10-CM | POA: Diagnosis not present

## 2019-01-13 DIAGNOSIS — B354 Tinea corporis: Secondary | ICD-10-CM | POA: Diagnosis not present

## 2019-02-17 DIAGNOSIS — H918X3 Other specified hearing loss, bilateral: Secondary | ICD-10-CM | POA: Diagnosis not present

## 2019-02-17 DIAGNOSIS — R2681 Unsteadiness on feet: Secondary | ICD-10-CM | POA: Diagnosis not present

## 2019-02-17 DIAGNOSIS — I10 Essential (primary) hypertension: Secondary | ICD-10-CM | POA: Diagnosis not present

## 2019-02-17 DIAGNOSIS — K5900 Constipation, unspecified: Secondary | ICD-10-CM | POA: Diagnosis not present

## 2019-02-17 DIAGNOSIS — R202 Paresthesia of skin: Secondary | ICD-10-CM | POA: Diagnosis not present

## 2019-02-17 DIAGNOSIS — I509 Heart failure, unspecified: Secondary | ICD-10-CM | POA: Diagnosis not present

## 2019-02-17 DIAGNOSIS — Z86010 Personal history of colonic polyps: Secondary | ICD-10-CM | POA: Diagnosis not present

## 2019-02-27 ENCOUNTER — Encounter: Payer: Self-pay | Admitting: *Deleted

## 2019-02-27 DIAGNOSIS — H6123 Impacted cerumen, bilateral: Secondary | ICD-10-CM | POA: Diagnosis not present

## 2019-02-27 DIAGNOSIS — H60333 Swimmer's ear, bilateral: Secondary | ICD-10-CM | POA: Diagnosis not present

## 2019-02-28 IMAGING — DX DG CHEST 2V
2 series · 2 of 2 positions shown · non-contrast
Comparison: Chest CT scan March 26, 2018

CLINICAL DATA: Exertional dyspnea for the past several weeks.
Hypoxia with exertion. Possible CHF. History of breast malignancy
status post right mastectomy., former smoker.

EXAM:
CHEST - 2 VIEW

[chest lat]
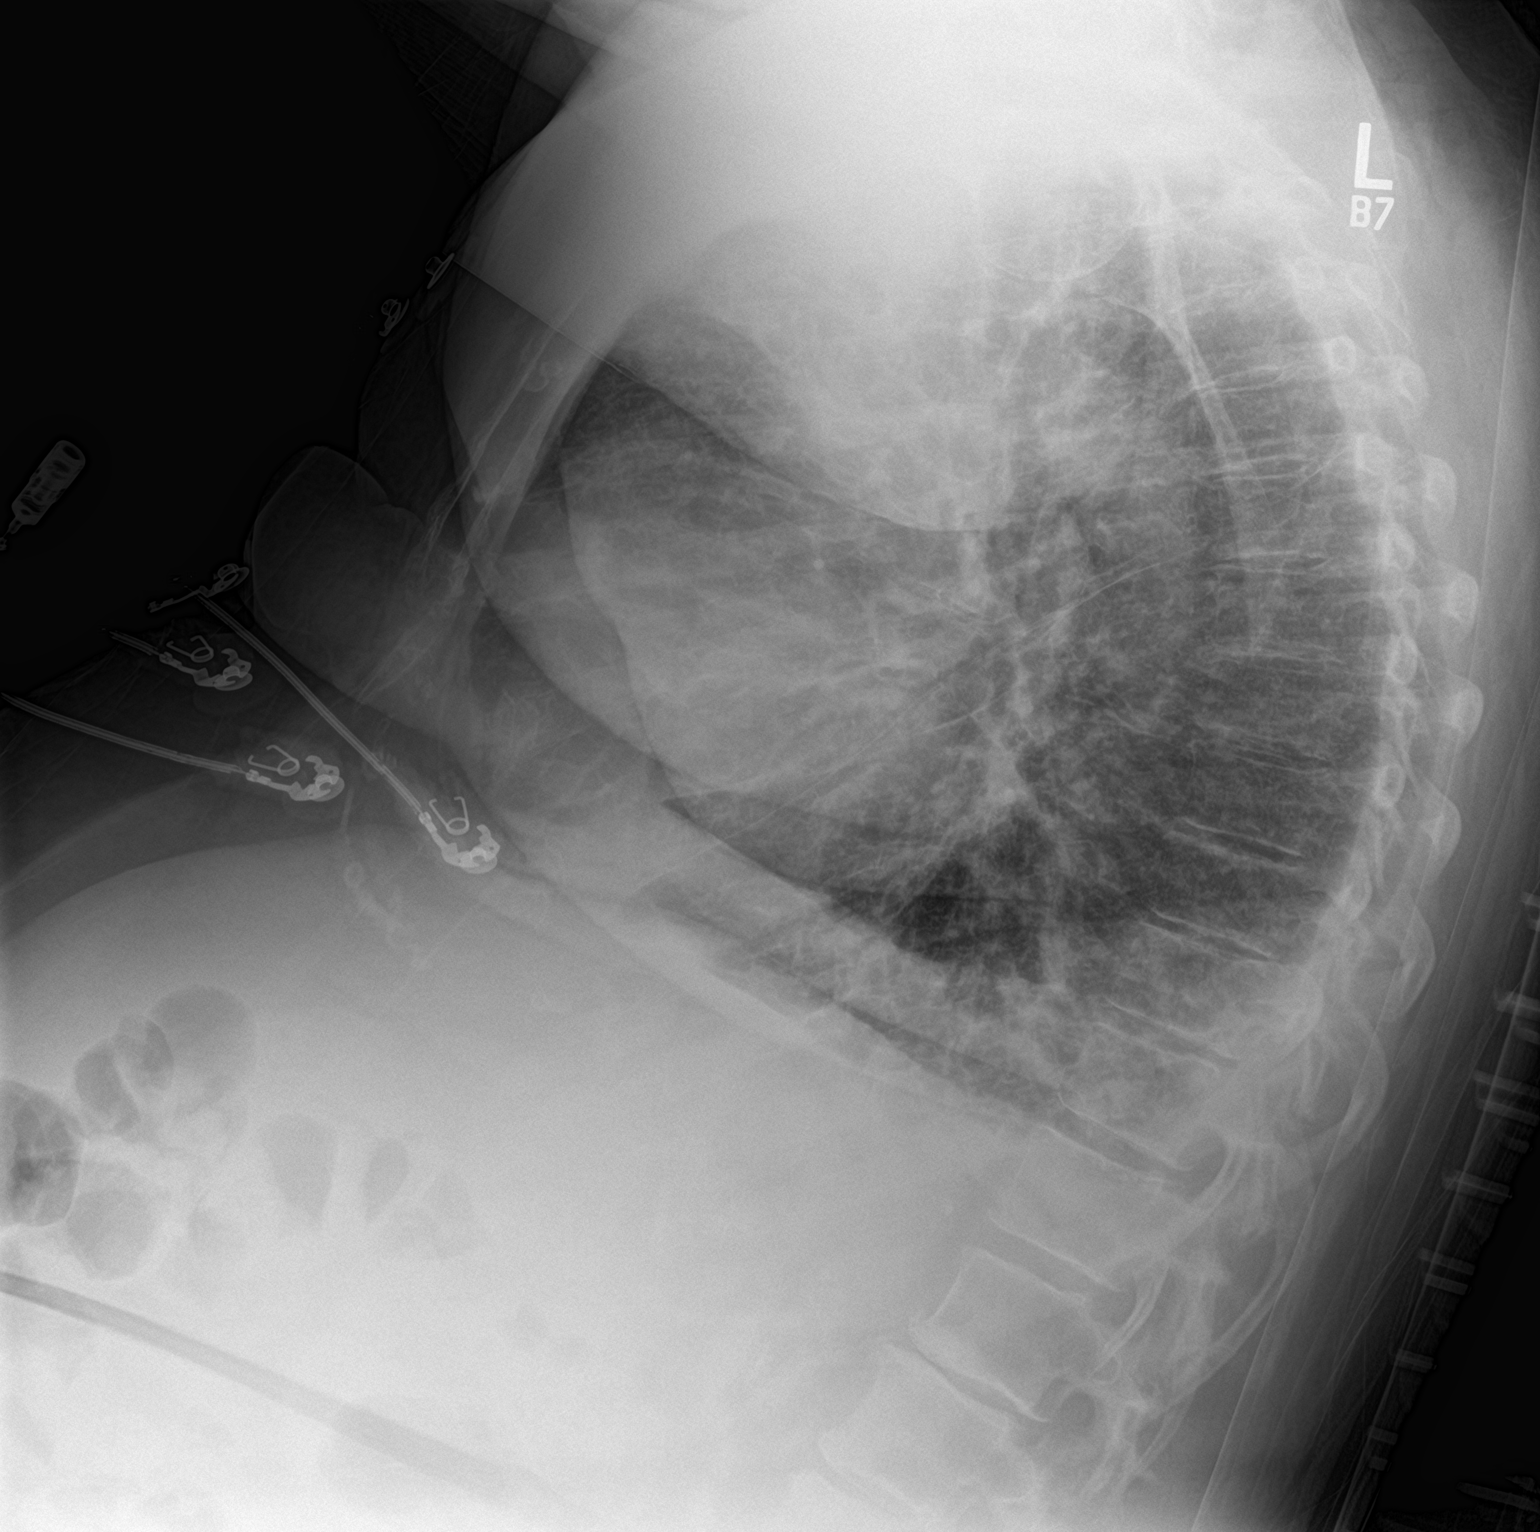

[chest ap]
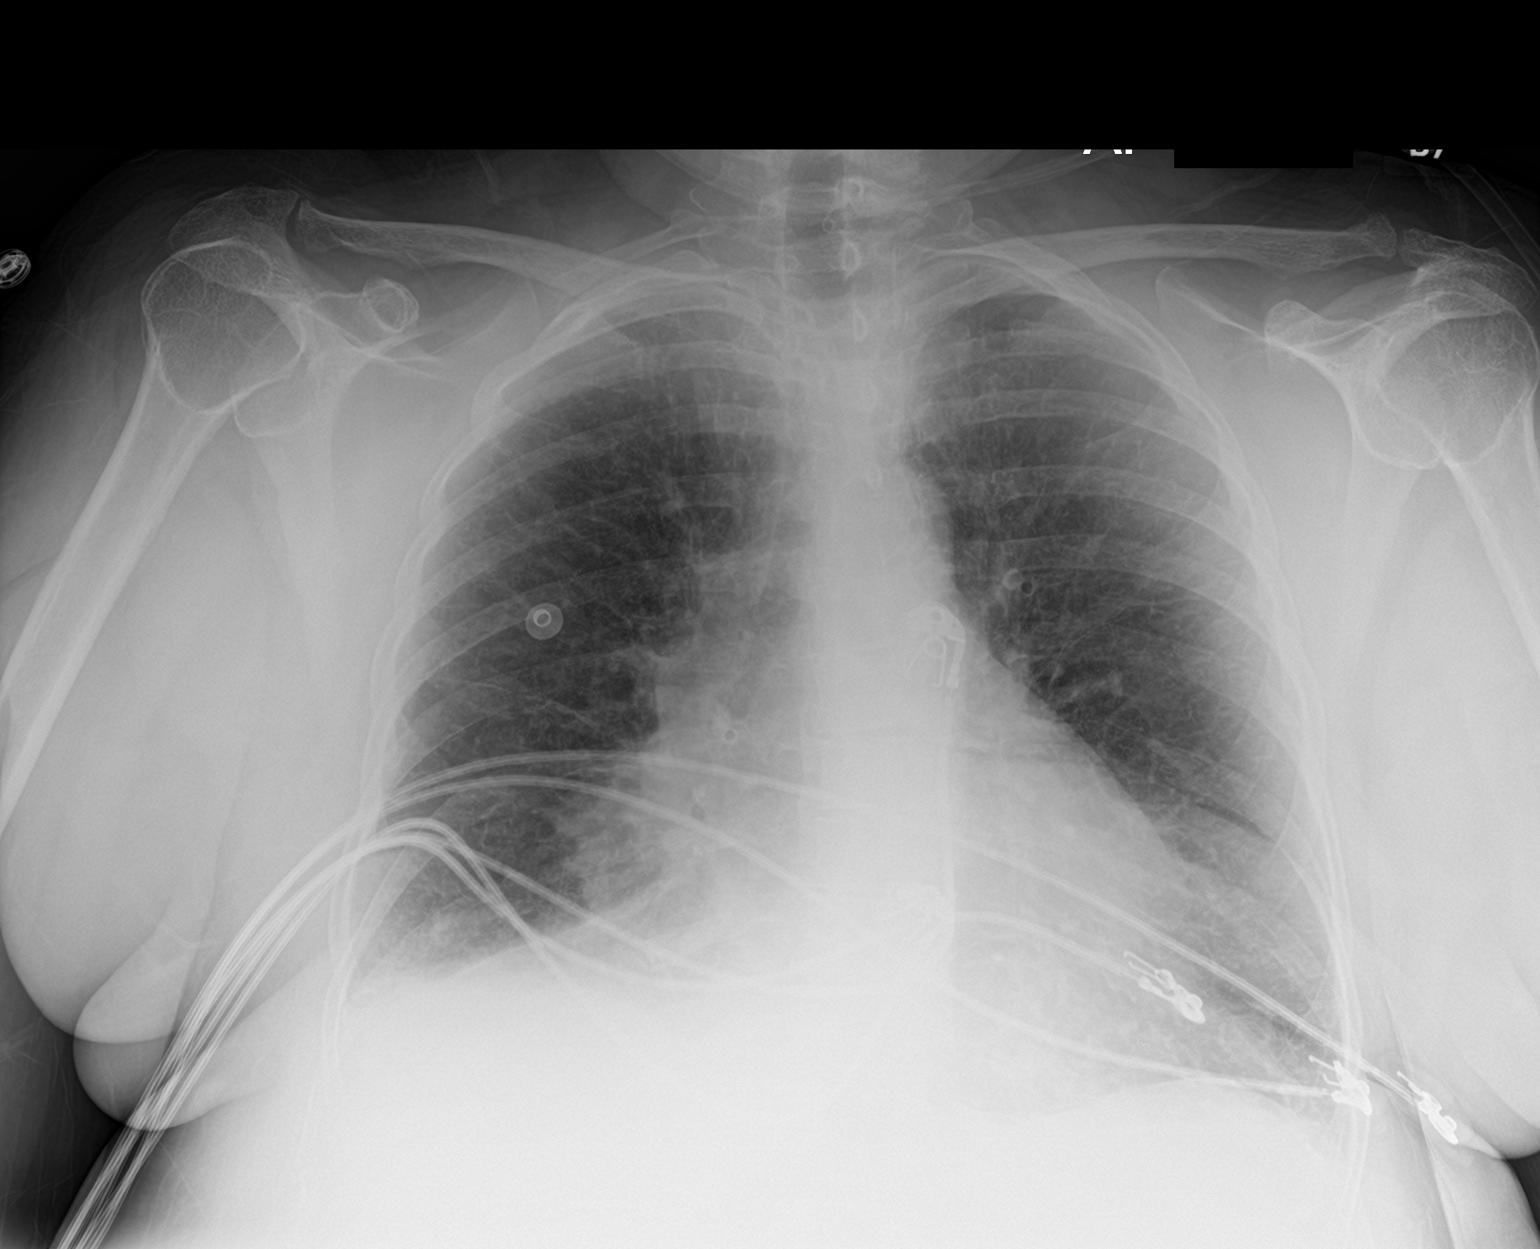

[2 of 2 positions shown; findings below may reference images not displayed]

FINDINGS: The lungs are adequately inflated. There is increased density at the
left lung base compatible with pleural fluid layering posteriorly
and laterally. No left effusion is observed. The cardiac silhouette
is mildly enlarged. The pulmonary vascularity is mildly prominent
centrally. There is calcification in the wall of the aortic arch.
The trachea is midline. There is mild multilevel degenerative disc
space narrowing of the thoracic spine.
IMPRESSION: Mild CHF superimposed upon chronic bronchitic changes. Small right
pleural effusion. Thoracic aortic atherosclerosis.

## 2019-03-03 ENCOUNTER — Telehealth: Payer: Self-pay | Admitting: Neurology

## 2019-03-03 ENCOUNTER — Encounter: Payer: Self-pay | Admitting: Neurology

## 2019-03-03 ENCOUNTER — Ambulatory Visit (INDEPENDENT_AMBULATORY_CARE_PROVIDER_SITE_OTHER): Payer: Medicare Other | Admitting: Neurology

## 2019-03-03 VITALS — BP 164/82 | HR 100 | Ht 62.0 in | Wt 195.0 lb

## 2019-03-03 DIAGNOSIS — G3281 Cerebellar ataxia in diseases classified elsewhere: Secondary | ICD-10-CM | POA: Diagnosis not present

## 2019-03-03 DIAGNOSIS — R200 Anesthesia of skin: Secondary | ICD-10-CM | POA: Diagnosis not present

## 2019-03-03 DIAGNOSIS — G479 Sleep disorder, unspecified: Secondary | ICD-10-CM | POA: Diagnosis not present

## 2019-03-03 DIAGNOSIS — R2689 Other abnormalities of gait and mobility: Secondary | ICD-10-CM | POA: Diagnosis not present

## 2019-03-03 DIAGNOSIS — R296 Repeated falls: Secondary | ICD-10-CM

## 2019-03-03 DIAGNOSIS — R202 Paresthesia of skin: Secondary | ICD-10-CM | POA: Diagnosis not present

## 2019-03-03 NOTE — Progress Notes (Addendum)
Subjective:    Patient ID: Kimberly Waters is a 73 y.o. female.  HPI     Star Age, MD, PhD [facility] [address] [place], [place] [address]  Dear Dr. Inda Merlin,  I saw your patient, Kimberly Waters, upon your kind request in my neurologic clinic today for initial consultation of her paresthesias and gait disorder. The patient is accompanied by her son and daughter today. As you know, Kimberly Waters is a 73 year old right-handed woman with an underlying complex medical history of vitamin D deficiency, breast cancer with status post double mastectomy, prior smoking, hearing loss with bilateral hearing aids, reflux disease, COPD, hyperlipidemia, vertigo, history of depression, obesity, and hypothyroidism, who reports difficulty walking, recurrent falls and being off-balance for the past year at least. I reviewed your office note from 01/13/2019, which you kindly included. Prior TSH testing was normal and B12 was reportedly normal about a year and half ago, per your records. She has noted numbness in the bottom of both feet for the past few months. Of note, she is on multiple medications currently. She was on hydroxyzine 75 mg at bedtime, lisinopril, imipramine 200 mg at bedtime, Spiriva, pro-air, baby aspirin, levothyroxine, Arimidex,  omeprazole, simvastatin, Lexapro generic 10 mg daily, furosemide 40 mg daily, potassium, melatonin, bupropion long-acting 150 mg daily. You rechecked her labs including BMP and B12 at the time. She was also advised to reduce her melatonin, reduce her imipramine and hydroxyzine. She was advised to stop her bupropion and she was advised to continue with Lexapro at the time. She lost her mother at the age of 75 and October 2019. She has been coping well, has a supportive family. She does report lack of motivation and lack of stamina. She has been quite an active per daughter's report. She also does not hydrate well with water by self  admission and as endorse by her daughter and her son. She likes to drink soda including regular Coca-Cola and Dr. Malachi Bonds. She drinks about 2 bottles of soda per day, maybe 2 bottles of water per day. She does not drink coffee. She does not drink alcohol and does not have a history of excessive alcohol use in the past. Of note, she does not sleep very well. She snores. She was advised by her pulmonologist to proceed with a sleep study, she did not pursue it. She reports bilateral knee pain, left more than right, she also reports back pain. She had bilateral shoulder operations an arthroscopic knee operation, in April 2019 she was hospitalized for acute diastolic congestive heart failure. She started using a walker after that and gradually came off of the walker, she uses a cane. When she goes out she still uses a walker for fear of falling. She denies any sudden onset of one-sided weakness or numbness. She does have numbness in both feet. She has noticed this for the past few months. She was recently treated by Dr. Lucia Gaskins, ENT, for an ear infection and per daughter, her balance actually improved a little bit.  Her Past Medical History Is Significant For: Past Medical History:  Diagnosis Date  . Allergy   . Breast cancer ([place])   . Colon polyps   . Depression   . Dyspnea    SEASONAL  . Family history of breast cancer   . Family history of colon cancer   . GERD (gastroesophageal reflux disease)   . History of radiation therapy 06/05/17-07/20/17   right chest wall 50.4  Gy in 28 fractions, right axillary nodal region 45 Gy in 25 fractions  . Hyperlipidemia   . Hypertension   . Hypothyroidism   . PONV (postoperative nausea and vomiting)     Her Past Surgical History Is Significant For: Past Surgical History:  Procedure Laterality Date  . ABDOMINAL HYSTERECTOMY    . KNEE SURGERY Right   . MASTECTOMY W/ SENTINEL NODE BIOPSY Right 03/05/2017   Procedure: BILATERAL TOTAL MASTECTOMIES WITH RIGHT  SENTINEL LYMPH NODE BIOPSIES;  Surgeon: Excell Seltzer, MD;  Location: [facility];  Service: General;  Laterality: Right;  . SHOULDER SURGERY     BILAT    Her Family History Is Significant For: Family History  Problem Relation Age of Onset  . Heart disease Mother 10       stents  . Colon cancer Maternal Grandfather 95  . Colon cancer Maternal Aunt 47  . Colon cancer Maternal Uncle        dx in his 52s  . Breast cancer Paternal Aunt 54  . Breast cancer Cousin 32       paternal first cousin  . Lung cancer Paternal Grandfather   . Colon cancer Maternal Uncle   . Head & neck cancer Maternal Uncle        oral cancer    Her Social History Is Significant For: Social History   Socioeconomic History  . Marital status: Married    Spouse name: Not on file  . Number of children: 2  . Years of education: Not on file  . Highest education level: Not on file  Occupational History  . Occupation: reitred [employer]  . Financial resource strain: Not on file  . Food insecurity:    Worry: Not on file    Inability: Not on file  . Transportation needs:    Medical: Not on file    Non-medical: Not on file  Tobacco Use  . Smoking status: Former Smoker    Packs/day: 1.00    Years: 45.00    Pack years: 45.00    Types: Cigarettes    Last attempt to quit: 02/22/2017    Years since quitting: 2.0  . Smokeless tobacco: Never Used  Substance and Sexual Activity  . Alcohol use: No    Alcohol/week: 0.0 standard drinks  . Drug use: No  . Sexual activity: Not on file  Lifestyle  . Physical activity:    Days per week: Not on file    Minutes per session: Not on file  . Stress: Not on file  Relationships  . Social connections:    Talks on phone: Not on file    Gets together: Not on file    Attends religious service: Not on file    Active member of club or organization: Not on file    Attends meetings of clubs or organizations: Not on file    Relationship status: Not  on file  Other Topics Concern  . Not on file  Social History Narrative  . Not on file    Her Allergies Are:  Allergies  Allergen Reactions  . Adhesive [Tape] Other (See Comments)    (skin tears)  Tolerates PAPER TAPE  . Codeine Nausea Only  . Other Nausea Only    Anesthesia--nausea   :   Her Current Medications Are:  Outpatient Encounter Medications as of 03/03/2019  Medication Sig  . albuterol (PROVENTIL,VENTOLIN) 90 MCG/ACT inhaler Inhale 2 puffs into the lungs every 4 (four) hours as  needed. (Patient taking differently: Inhale 2 puffs into the lungs every 4 (four) hours as needed for wheezing or shortness of breath. )  . anastrozole (ARIMIDEX) 1 MG tablet TAKE 1 TABLET BY MOUTH EVERY DAY  . aspirin EC 81 MG EC tablet Take 1 tablet (81 mg total) by mouth daily.  . budesonide-formoterol (SYMBICORT) 80-4.5 MCG/ACT inhaler Inhale 2 puffs into the lungs 2 (two) times daily.  . diphenhydrAMINE (BENADRYL) 25 MG tablet Take 25 mg by mouth as needed for sleep.  Marland Kitchen escitalopram (LEXAPRO) 10 MG tablet Take 1 tablet (10 mg total) by mouth daily.  . furosemide (LASIX) 40 MG tablet Take 1 tablet (40 mg total) by mouth daily.  . hydrOXYzine (ATARAX/VISTARIL) 25 MG tablet Take 25 mg by mouth at bedtime.  Marland Kitchen imipramine (TOFRANIL) 50 MG tablet Take 50 mg by mouth at bedtime.  Marland Kitchen ipratropium (ATROVENT) 0.06 % nasal spray Place 2 sprays into the nose 4 (four) times daily.  Marland Kitchen levothyroxine (SYNTHROID, LEVOTHROID) 112 MCG tablet TAKE ONE TABLET BY MOUTH ONCE DAILY (Patient taking differently: Take 112 mcg by mouth daily before breakfast. )  . lisinopril (PRINIVIL,ZESTRIL) 2.5 MG tablet Take 1 tablet (2.5 mg total) by mouth daily.  . Melatonin 10 MG TABS Take by mouth.  Marland Kitchen omeprazole (PRILOSEC) 20 MG capsule Take 20 mg by mouth daily.  . potassium chloride SA (K-DUR,KLOR-CON) 20 MEQ tablet Take 1 tablet (20 mEq total) by mouth daily.  . simvastatin (ZOCOR) 20 MG tablet Take 1 tablet (20 mg total) by mouth  at bedtime.  . Tiotropium Bromide Monohydrate (SPIRIVA RESPIMAT) 2.5 MCG/ACT AERS Inhale 2 puffs into the lungs daily.  . [DISCONTINUED] chlorpheniramine (CHLOR-TRIMETON) 4 MG tablet Take 4 mg by mouth 2 (two) times daily as needed for allergies.  . [DISCONTINUED] imipramine (TOFRANIL) 50 MG tablet Take 4 tablets (200 mg total) by mouth at bedtime.  . [DISCONTINUED] metoprolol succinate (TOPROL-XL) 25 MG 24 hr tablet Take 25 mg by mouth daily.   . [DISCONTINUED] predniSONE (DELTASONE) 10 MG tablet Take 40mg x1day, 30mg x1day, 20mg x1day, 10mg x1day, 5mg x1day, then stop   No facility-administered encounter medications on file as of 03/03/2019.   :  Review of Systems:  Out of a complete 14 point review of systems, all are reviewed and negative with the exception of these symptoms as listed below: Review of Systems  Neurological:       Pt presents today to discuss her imbalance. Pt has had a few falls.    Objective:  Neurological Exam  Physical Exam Physical Examination:   Vitals:   03/03/19 1436  BP: (!) 164/82  Pulse: (!) 11   General Examination: The patient is a very pleasant 73 y.o. female in no acute distress. She appears well-developed and well-nourished and well groomed.   HEENT: Normocephalic, atraumatic, pupils are equal, round and reactive to light and accommodation. She has corrective eyeglasses in place. Hearing is impaired, she does have bilateral hearing aids in place. Face is symmetric, no facial asymmetry noted. She has no carotid bruits. Airway examination reveals significant mouth dryness, tongue protrudes centrally and palate elevates symmetrically. She has moderate airway crowding. Adequate dental hygiene noted. Speech is clear with no dysarthria noted.  Chest: Clear to auscultation but distant breath sounds.   Heart: S1+S2+0, regular and normal without murmurs, rubs or gallops noted.   Abdomen: Soft, non-tender and non-distended with normal bowel sounds appreciated on  auscultation.  Extremities: There is trace pitting edema in the distal lower extremities bilaterally.  Skin: Warm  and dry without trophic changes noted. Bruising noted across forearms and dorsum of both hands. Scratch marks noticed on both arms, dry skin, eczematous changes.  Musculoskeletal: exam reveals somewhat slightly decreased range of motion in both knees, and both shoulders. Some arthritic changes in the hands.  Neurologically:  Mental status: The patient is awake, alert and oriented in all 4 spheres. Her immediate and remote memory, attention, language skills and fund of knowledge are appropriate. There is no evidence of aphasia, agnosia, apraxia or anomia. Speech is clear with normal prosody and enunciation. Thought process is linear. Mood is normal and affect is normal.  Cranial nerves II - XII are as described above under HEENT exam. In addition: shoulder shrug is normal with equal shoulder height noted. Motor exam: Normal bulk, strength and tone is noted. There is no drift, tremor or rebound. Romberg shows sway. Reflexes are 1-2+ throughout. Babinski: Toes are flexor bilaterally. Fine motor skills and coordination: intact with normal finger taps, normal hand movements, normal rapid alternating patting, normal foot taps and normal foot agility.  Cerebellar testing: No dysmetria or intention tremor.  Sensory exam: intact to light touch, pinprick, vibration, temperature sense in the upper extremities and decreased to all modalities in the right more than left distal lower extremities, probably around mid shin areas.  Gait, station and balance: She stands with difficulty and pushes herself up, posture is age-appropriate but slightly wide-based and says noted. She walks insecurely with preserved arm swing, no shuffling. She does not have a cane or walker with her today.  Assessment and Plan:   In summary, PAYGE EPPES is a very pleasant 73 y.o.-year old female with an underlying complex  medical history of vitamin D deficiency, breast cancer with status post double mastectomy, prior smoking, hearing loss with bilateral hearing aids, reflux disease, COPD, hyperlipidemia, vertigo, history of depression, obesity, and hypothyroidism, who presents for evaluation of her gait and balance disorder, recurrent falls and paresthesias. On examination, she does have some decreased sensation in both feet. I do believe her gait disorder is more nonspecific and her balance problem a resultant of multiple factors including degenerative arthritis, hearing loss, some medication side effects including from the imipramine and other potentially sedating medications, possible neuropathy, suboptimal hydration with water, aging, deconditioning, obesity, not sleeping well and not being fully rested, with some concern for underlying obstructive sleep apnea. I talked to the patient and her children at length today. I suggested we proceed with laboratory testing with blood work and scheduling an EMG and nerve conduction study to look at the possibility of peripheral neuropathy. Furthermore, would like to proceed with a brain MRI to rule out a structural cause of her balance disorder. She is encouraged to use her cane and particularly her walker for gait safety, we talked about the importance of fall prevention. She is advised to change positions slowly, stay better hydrated with water and stay well rested, she is encouraged to talk to her pulmonologist about pursuing a sleep study, which was previously recommended. As far as medications are concerned, I recommended the following at this time: no change.  I Plan to see her back after testing is completed, we will also keep her posted in the interim as to her test results as we get them back. I answered all their questions today and the patient and her family were in agreement. Thank you very much for allowing me to participate in the care of this nice patient. If I can be of  any further assistance to you please do not hesitate to call me at [phone].  Sincerely,   Star Age, MD, PhD  03/04/2019: will change MRI brain ro without contrast only secondary to impaired kidney function.

## 2019-03-03 NOTE — Patient Instructions (Addendum)
I believe you have a multifactorial gait disorder, meaning, that it is Kimberly Waters is due to a combination of factors. These factors include: normal aging, degenerative arthritis of your back and knees, and possible atherosclerosis of the blood vessels in your brain, you do have risk factors for stroke, sleep disorder, medication effect, suboptimal hydration with water, possible neuropathy, obesity, hearing loss/ear infection.   Please remember to stand up slowly and get your bearings first turn slowly, no bending down to pick anything, no heavy lifting, be extra careful at night and first thing in the morning. Also, be careful in the Bathroom and the kitchen.   Remember to drink plenty of fluid, eat healthy meals and do not skip any meals. Try to eat protein with a every meal and eat a healthy snack such as fruit or nuts or yogurt in between meals. Try to keep a regular sleep-wake schedule and try to exercise daily, particularly in the form of walking, 20-30 minutes a day, if you can. You should consider using a cane and use your walker when outside.   As far as your medications are concerned, I would like to suggest no new medications.   As far as diagnostic testing: MRI brain w and wo contras. We will call you with the test results. We will have to schedule you for this on a separate date. This test requires authorization from your insurance, and we will take care of the insurance process.  We will check blood work today and call you with the test results. We will do an EMG and nerve conduction velocity test, which is an electrical nerve and muscle test, which we will schedule. We will call you with the results.  Please talk to your lung doctor about doing a sleep study.

## 2019-03-03 NOTE — Telephone Encounter (Signed)
medicare/bankers of life order sent to GI. No auth they will reach out to the pt to schedule.

## 2019-03-04 ENCOUNTER — Telehealth: Payer: Self-pay

## 2019-03-04 NOTE — Telephone Encounter (Signed)
-----   Message from Star Age, MD sent at 03/04/2019  8:40 AM EDT ----- Pls call pt: Some test results are pending but I wanted to let the patient know that her kidney function is not good enough to proceed with a brain MRI with contrast. I will change the order to brain MRI without contrast only. Please notify patient that her kidney function is mildly abnormal and that she should follow-up with her primary care physician regarding this. I am not sure how this compares to her most recent labs from her PCP. In addition, her A1c which is the diabetes marker is elevated in the prediabetes range, as such, she may be at risk for neuropathy from even prediabetes. I would recommend weight loss and perhaps a visit with a nutritionist, her PCP will likely be able to assist with that. Michel Bickers

## 2019-03-04 NOTE — Progress Notes (Signed)
Pls call pt: Some test results are pending but I wanted to let the patient know that her kidney function is not good enough to proceed with a brain MRI with contrast. I will change the order to brain MRI without contrast only. Please notify patient that her kidney function is mildly abnormal and that she should follow-up with her primary care physician regarding this. I am not sure how this compares to her most recent labs from her PCP. In addition, her A1c which is the diabetes marker is elevated in the prediabetes range, as such, she may be at risk for neuropathy from even prediabetes. I would recommend weight loss and perhaps a visit with a nutritionist, her PCP will likely be able to assist with that. Michel Bickers

## 2019-03-04 NOTE — Addendum Note (Signed)
Addended by: Star Age on: 03/04/2019 08:35 AM   Modules accepted: Orders

## 2019-03-04 NOTE — Telephone Encounter (Signed)
I called pt to discuss her lab work. No answer, left a message asking her to call me back.

## 2019-03-05 LAB — MULTIPLE MYELOMA PANEL, SERUM
ALBUMIN SERPL ELPH-MCNC: 3.7 g/dL (ref 2.9–4.4)
Albumin/Glob SerPl: 1.1 (ref 0.7–1.7)
Alpha 1: 0.2 g/dL (ref 0.0–0.4)
Alpha2 Glob SerPl Elph-Mcnc: 1.2 g/dL — ABNORMAL HIGH (ref 0.4–1.0)
B-Globulin SerPl Elph-Mcnc: 1.2 g/dL (ref 0.7–1.3)
Gamma Glob SerPl Elph-Mcnc: 0.9 g/dL (ref 0.4–1.8)
Globulin, Total: 3.5 g/dL (ref 2.2–3.9)
IGG (IMMUNOGLOBIN G), SERUM: 757 mg/dL (ref 700–1600)
IgA/Immunoglobulin A, Serum: 132 mg/dL (ref 64–422)
IgM (Immunoglobulin M), Srm: 250 mg/dL — ABNORMAL HIGH (ref 26–217)
M Protein SerPl Elph-Mcnc: 0.3 g/dL — ABNORMAL HIGH

## 2019-03-05 LAB — COMPREHENSIVE METABOLIC PANEL
ALBUMIN: 4.7 g/dL (ref 3.7–4.7)
ALK PHOS: 85 IU/L (ref 39–117)
ALT: 38 IU/L — AB (ref 0–32)
AST: 29 IU/L (ref 0–40)
Albumin/Globulin Ratio: 1.9 (ref 1.2–2.2)
BUN / CREAT RATIO: 9 — AB (ref 12–28)
BUN: 13 mg/dL (ref 8–27)
Bilirubin Total: 0.3 mg/dL (ref 0.0–1.2)
CALCIUM: 9.7 mg/dL (ref 8.7–10.3)
CO2: 22 mmol/L (ref 20–29)
CREATININE: 1.43 mg/dL — AB (ref 0.57–1.00)
Chloride: 99 mmol/L (ref 96–106)
GFR calc Af Amer: 43 mL/min/{1.73_m2} — ABNORMAL LOW (ref >59)
GFR calc non Af Amer: 37 mL/min/{1.73_m2} — ABNORMAL LOW (ref >59)
GLOBULIN, TOTAL: 2.5 g/dL (ref 1.5–4.5)
Glucose: 96 mg/dL (ref 65–99)
Potassium: 4.2 mmol/L (ref 3.5–5.2)
Sodium: 141 mmol/L (ref 134–144)
Total Protein: 7.2 g/dL (ref 6.0–8.5)

## 2019-03-05 LAB — RHEUMATOID FACTOR: Rhuematoid fact SerPl-aCnc: <10 IU/mL (ref 0.0–13.9)

## 2019-03-05 LAB — HGB A1C W/O EAG: HEMOGLOBIN A1C: 6.3 % — AB (ref 4.8–5.6)

## 2019-03-05 LAB — ANA W/REFLEX: Anti Nuclear Antibody(ANA): NEGATIVE

## 2019-03-05 NOTE — Telephone Encounter (Signed)
Pt returned my call and I advised her of her lab results. Pt will follow up with Dr. Inda Merlin regarding her kidney function and her HgA1C. Pt understands that I will call her with MRI results when Dr. Rexene Alberts reviews them. Pt verbalized understanding of results and recommendations.

## 2019-03-05 NOTE — Telephone Encounter (Signed)
I called pt again to discuss. No answer, left a message asking her to call me back. 

## 2019-03-06 NOTE — Progress Notes (Signed)
Please call pt: One of the proteins in the blood called IgM is elevated: I would like to get input from another specialist, called hematologist. If she is agreeable, I would like to place referral. Please let me know.  Michel Bickers

## 2019-03-10 ENCOUNTER — Telehealth: Payer: Self-pay

## 2019-03-10 DIAGNOSIS — R269 Unspecified abnormalities of gait and mobility: Secondary | ICD-10-CM

## 2019-03-10 DIAGNOSIS — D472 Monoclonal gammopathy: Secondary | ICD-10-CM

## 2019-03-10 DIAGNOSIS — G629 Polyneuropathy, unspecified: Secondary | ICD-10-CM

## 2019-03-10 NOTE — Telephone Encounter (Signed)
I called pt to discuss. No answer, left a message asking her to call me back. 

## 2019-03-10 NOTE — Telephone Encounter (Signed)
-----   Message from Star Age, MD sent at 03/06/2019  5:39 PM EDT ----- Please call pt: One of the proteins in the blood called IgM is elevated: I would like to get input from another specialist, called hematologist. If she is agreeable, I would like to place referral. Please let me know.  Michel Bickers

## 2019-03-12 NOTE — Telephone Encounter (Signed)
I called pt to discuss her labs. No answer, left a message asking her to call me back. 

## 2019-03-12 NOTE — Telephone Encounter (Signed)
Pt returned my call. I advised her of her lab results and Dr. Guadelupe Sabin recommendations. Pt is agreeable to seeing hematology but will likely wait until it is safe for her to be in public due to her lung dx and COPD. Pt verbalized understanding of results. Pt had no questions at this time but was encouraged to call back if questions arise.

## 2019-03-13 NOTE — Addendum Note (Signed)
Addended by: Star Age on: 03/13/2019 12:19 PM   Modules accepted: Orders

## 2019-03-13 NOTE — Telephone Encounter (Signed)
Referral sent and and I called Patient  And gave her telephone number. [phone].

## 2019-03-13 NOTE — Telephone Encounter (Signed)
I placed a referral to hematology.

## 2019-04-02 ENCOUNTER — Encounter: Payer: Medicare Other | Admitting: Neurology

## 2019-05-13 DIAGNOSIS — L239 Allergic contact dermatitis, unspecified cause: Secondary | ICD-10-CM | POA: Diagnosis not present

## 2019-05-14 DIAGNOSIS — F322 Major depressive disorder, single episode, severe without psychotic features: Secondary | ICD-10-CM | POA: Diagnosis not present

## 2019-05-14 DIAGNOSIS — H918X3 Other specified hearing loss, bilateral: Secondary | ICD-10-CM | POA: Diagnosis not present

## 2019-05-14 DIAGNOSIS — I10 Essential (primary) hypertension: Secondary | ICD-10-CM | POA: Diagnosis not present

## 2019-05-14 DIAGNOSIS — Z86010 Personal history of colonic polyps: Secondary | ICD-10-CM | POA: Diagnosis not present

## 2019-05-14 DIAGNOSIS — G4700 Insomnia, unspecified: Secondary | ICD-10-CM | POA: Diagnosis not present

## 2019-05-14 DIAGNOSIS — I509 Heart failure, unspecified: Secondary | ICD-10-CM | POA: Diagnosis not present

## 2019-05-14 DIAGNOSIS — K5900 Constipation, unspecified: Secondary | ICD-10-CM | POA: Diagnosis not present

## 2019-05-22 ENCOUNTER — Telehealth: Payer: Self-pay | Admitting: Neurology

## 2019-05-22 ENCOUNTER — Other Ambulatory Visit: Payer: Self-pay | Admitting: Oncology

## 2019-05-22 NOTE — Telephone Encounter (Signed)
Due to current COVID 19 pandemic, our office is severely reducing in office visits until further notice, in order to minimize the risk to our patients and healthcare providers.   Called patient and confirmed a virtual visit for her 6/2 appointment. Patient verbalized understanding of the doxy.me process and I have sent her an e-mail with link and directions as well as my name and office number/hours for reference. Patient understands that she will receive a call from RN to update chart.  Pt understands that although there may be some limitations with this type of visit, we will take all precautions to reduce any security or privacy concerns.  Pt understands that this will be treated like an in office visit and we will file with pt's insurance, and there may be a patient responsible charge related to this service.

## 2019-05-26 NOTE — Telephone Encounter (Signed)
I contacted the pt and attempted to complete pre charting for 06/14/19. Pt advised she was currently inline at [facility] and could not speak with me. Pt was agreeable with me calling back on 05/27/19 in the am.

## 2019-05-27 ENCOUNTER — Ambulatory Visit: Payer: Medicare Other | Admitting: Neurology

## 2019-05-27 NOTE — Telephone Encounter (Signed)
I reached out to the pt and started to review her chart for the 3 month f/u virtual visit scheduled with Dr. Jannifer Franklin on 05/27/19. Upon review, pt was evaluated by Dr. Rexene Alberts on 03/03/19 and she recommended a EMG/ NCS test be completed by Dr. Jannifer Franklin. Pt was originally scheduled for 04/02/19 with Dr. Jannifer Franklin for this test but test had to be rescheduled due to covid 19.  I advised since then our office has restarted EMG/NCS and we could reschedule this for her, but it would have to be for another date. Pt was agreeable to scheduling appt for 06/18/19 1245 check in time of 1215 pm.  Pt has been removed from 05/27/19 schedule.

## 2019-05-27 NOTE — Progress Notes (Signed)
[place]  [phone] [phone]     ID: Kimberly Waters DOB: 08-13-1946  MR#: [id number]  DEY#:[id number]  Patient Care Team: Josetta Huddle, MD as PCP - General (Internal Medicine) Juanita Craver, MD as Consulting Physician (Gastroenterology) Desirre Eickhoff, Virgie Dad, MD as Consulting Physician (Oncology) Excell Seltzer, MD as Consulting Physician (General Surgery) Gery Pray, MD as Consulting Physician (Radiation Oncology) Delice Bison, Charlestine Massed, NP as Nurse Practitioner (Hematology and Oncology) Kathrynn Ducking, MD as Consulting Physician (Neurology) OTHER MD:   CHIEF COMPLAINT: Estrogen receptor positive breast cancer  CURRENT TREATMENT: Anastrozole   INTERVAL HISTORY: Kimberly Waters returns today for follow-up and treatment of her estrogen receptor positive breast cancer.   She continues on anastrozole.She denies issues with hot flashes and vaginal dryness. She pays less than $10 for three months.   REVIEW OF SYSTEMS: Kimberly Waters reports doing very well overall. She states having issues with her feet-- she feels a numbness and an "electric shock" when she climbs stairs. She notes she learned she's allergic to laundry detergent, which has led her to being able to relieve the itching she reported at her last visit. Her mother passed away in 11-10-2018 from "old age" at age 71.  The patient denies unusual headaches, visual changes, nausea, vomiting, stiff neck, dizziness, or gait imbalance. There has been no cough, phlegm production, or pleurisy, no chest pain or pressure, and no change in bowel or bladder habits. The patient denies fever, rash, bleeding, unexplained fatigue or unexplained weight loss. A detailed review of systems was otherwise entirely negative.    BREAST CANCER HISTORY: From the original intake note:  Kimberly Waters had an unremarkable mammogram 07/01/2012, after which she did not obtain further mammography. Sometime late 2017 she noted the right nipple  was inverted. She brought this to medical attention and on 01/25/2017 she underwent bilateral mammography with tomography and right breast ultrasonography at [facility]. The breast density was category C. In the central right breast there was an area of architectural distortion associated with nipple retraction. Ultrasound confirmed a 2.2 cm irregular mass in the right breast central to the nipple. In addition there was a 2.5 cm irregular mass in the right breast superiorly. The right axilla was sonographically benign.  On 01/29/2017 she underwent biopsy of both these masses, and both showed morphologically similar invasive ductal carcinomas, grade 1 or 2, with evidence of lymphovascular invasion. Both tumors were 100% estrogen receptor positive with strong staining intensity, and 60-100% progesterone receptor positive, with strong staining intensity. The MIP-1 varied from 5-10%. Both tumors were HER-2 negative, with a signals ratio 1.12-1.39, and the number per cell 2.30-3.97.  The patient's subsequent history is as detailed below   PAST MEDICAL HISTORY: Past Medical History:  Diagnosis Date  . Allergy   . Breast cancer ([place])   . Colon polyps   . Depression   . Dyspnea    SEASONAL  . Family history of breast cancer   . Family history of colon cancer   . GERD (gastroesophageal reflux disease)   . History of radiation therapy 06/05/17-07/20/17   right chest wall 50.4 Gy in 28 fractions, right axillary nodal region 45 Gy in 25 fractions  . Hyperlipidemia   . Hypertension   . Hypothyroidism   . PONV (postoperative nausea and vomiting)     PAST SURGICAL HISTORY: Past Surgical History:  Procedure Laterality Date  . ABDOMINAL HYSTERECTOMY    . KNEE SURGERY Right   . MASTECTOMY W/ SENTINEL NODE BIOPSY Right 03/05/2017  Procedure: BILATERAL TOTAL MASTECTOMIES WITH RIGHT SENTINEL LYMPH NODE BIOPSIES;  Surgeon: Excell Seltzer, MD;  Location: [place];  Service: General;  Laterality: Right;  .  SHOULDER SURGERY     BILAT    FAMILY HISTORY Family History  Problem Relation Age of Onset  . Heart disease Mother 43       stents  . Colon cancer Maternal Grandfather 9  . Colon cancer Maternal Aunt 31  . Colon cancer Maternal Uncle        dx in his 53s  . Breast cancer Paternal Aunt 24  . Breast cancer Cousin 49       paternal first cousin  . Lung cancer Paternal Grandfather   . Colon cancer Maternal Uncle   . Head & neck cancer Maternal Uncle        oral cancer   The patient's father died at age 66. Her mother died at age 71 from "old age." The patient had no brothers, 1 sister. On the mother's side a grandfather, and an uncle all had colon cancer's. On the father's side there is an aunt and a cousin with breast cancer, both diagnosed in their late 69s. There is no history of ovarian cancer in the family.   GYNECOLOGIC HISTORY:  No LMP recorded. Patient has had a hysterectomy.  menarche age 73, first live birth age 73, the patient is [place] P2. She underwent total abdominal hysterectomy with bilateral salpingo-oophorectomy remotely and took hormone replacement for approximately 6 years.    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 Her husband [name] ("[address] Mondor is retired from [employer]. Their son Kimberly Waters is an Chief Financial Officer living in [place]. Their daughter Kimberly Waters works at [employer] in [place] the patient has 5 grandchildren. She is a Tourist information centre manager     ADVANCED DIRECTIVES:  not in place    HEALTH MAINTENANCE: Social History   Tobacco Use  . Smoking status: Former Smoker    Packs/day: 1.00    Years: 45.00    Pack years: 45.00    Types: Cigarettes    Last attempt to quit: 02/22/2017    Years since quitting: 2.2  . Smokeless tobacco: Never Used  Substance Use Topics  . Alcohol use: No    Alcohol/week: 0.0 standard drinks  . Drug use: No     Colonoscopy: 03/18/2014/[facility]  PAP:  Status post hysterectomy  Bone density: Never    Allergies  Allergen Reactions  . Adhesive [Tape] Other (See Comments)    (skin tears)  Tolerates PAPER TAPE  . Codeine Nausea Only  . Other Nausea Only    Anesthesia--nausea     Current Outpatient Medications  Medication Sig Dispense Refill  . albuterol (PROVENTIL,VENTOLIN) 90 MCG/ACT inhaler Inhale 2 puffs into the lungs every 4 (four) hours as needed. (Patient taking differently: Inhale 2 puffs into the lungs every 4 (four) hours as needed for wheezing or shortness of breath. ) 17 g 3  . anastrozole (ARIMIDEX) 1 MG tablet Take 1 tablet (1 mg total) by mouth daily. 90 tablet 4  . aspirin EC 81 MG EC tablet Take 1 tablet (81 mg total) by mouth daily.    . budesonide-formoterol (SYMBICORT) 80-4.5 MCG/ACT inhaler Inhale 2 puffs into the lungs 2 (two) times daily. 1 Inhaler 11  . diphenhydrAMINE (BENADRYL) 25 MG tablet Take 25 mg by mouth as needed for sleep.    Marland Kitchen escitalopram (LEXAPRO) 10 MG tablet  Take 1 tablet (10 mg total) by mouth daily. 30 tablet 0  . furosemide (LASIX) 40 MG tablet Take 1 tablet (40 mg total) by mouth daily. 30 tablet 0  . hydrOXYzine (ATARAX/VISTARIL) 25 MG tablet Take 25 mg by mouth at bedtime.    Marland Kitchen imipramine (TOFRANIL) 50 MG tablet Take 50 mg by mouth at bedtime.    Marland Kitchen ipratropium (ATROVENT) 0.06 % nasal spray Place 2 sprays into the nose 4 (four) times daily. 15 mL 3  . levothyroxine (SYNTHROID, LEVOTHROID) 112 MCG tablet TAKE ONE TABLET BY MOUTH ONCE DAILY (Patient taking differently: Take 112 mcg by mouth daily before breakfast. ) 30 tablet 0  . lisinopril (PRINIVIL,ZESTRIL) 2.5 MG tablet Take 1 tablet (2.5 mg total) by mouth daily. 30 tablet 0  . Melatonin 10 MG TABS Take by mouth.    Marland Kitchen omeprazole (PRILOSEC) 20 MG capsule Take 20 mg by mouth daily.    . potassium chloride SA (K-DUR,KLOR-CON) 20 MEQ tablet Take 1 tablet (20 mEq total) by mouth daily. 30 tablet 0  . simvastatin (ZOCOR) 20 MG tablet Take 1 tablet  (20 mg total) by mouth at bedtime. 90 tablet 0  . Tiotropium Bromide Monohydrate (SPIRIVA RESPIMAT) 2.5 MCG/ACT AERS Inhale 2 puffs into the lungs daily. 1 Inhaler 11   No current facility-administered medications for this visit.     OBJECTIVE: Middle-aged white woman using a chair walker  Vitals:   05/28/19 1115  BP: 138/70  Pulse: 82  Resp: 18  Temp: 98.7 F (37.1 C)  SpO2: 99%   Body mass index is 34.5 kg/m.    ECOG FS:1 - Symptomatic but completely ambulatory   Sclerae unicteric, EOMs intact Wearing a mask No cervical or supraclavicular adenopathy Lungs no rales or rhonchi Heart regular rate and rhythm Abd soft, nontender, positive bowel sounds MSK no focal spinal tenderness, no upper extremity lymphedema Neuro: nonfocal, well oriented, appropriate affect Breasts: Status post bilateral mastectomies and also adjuvant radiation on the right.  There are some telangiectasias on the right which I reassured her are expected.  There is no evidence of chest wall recurrence.  Both axillae are benign     LAB RESULTS:  CMP     Component Value Date/Time   NA 141 03/03/2019 1542   NA 137 10/16/2017 1059   K 4.2 03/03/2019 1542   K 4.1 10/16/2017 1059   CL 99 03/03/2019 1542   CO2 22 03/03/2019 1542   CO2 25 10/16/2017 1059   GLUCOSE 96 03/03/2019 1542   GLUCOSE 160 (H) 05/27/2018 0850   GLUCOSE 113 10/16/2017 1059   BUN 13 03/03/2019 1542   BUN 20.3 10/16/2017 1059   CREATININE 1.43 (H) 03/03/2019 1542   CREATININE 1.2 (H) 10/16/2017 1059   CALCIUM 9.7 03/03/2019 1542   CALCIUM 9.3 10/16/2017 1059   PROT 7.2 03/03/2019 1542   PROT 7.2 10/16/2017 1059   ALBUMIN 4.7 03/03/2019 1542   ALBUMIN 3.5 10/16/2017 1059   AST 29 03/03/2019 1542   AST 17 10/16/2017 1059   ALT 38 (H) 03/03/2019 1542   ALT 25 10/16/2017 1059   ALKPHOS 85 03/03/2019 1542   ALKPHOS 48 10/16/2017 1059   BILITOT 0.3 03/03/2019 1542   BILITOT 0.40 10/16/2017 1059   GFRNONAA 37 (L) 03/03/2019 1542    GFRAA 43 (L) 03/03/2019 1542    INo results found for: SPEP, UPEP  Lab Results  Component Value Date   WBC 8.7 05/28/2019   NEUTROABS 7.0 05/28/2019   HGB  13.8 05/28/2019   HCT 42.3 05/28/2019   MCV 90.6 05/28/2019   PLT 330 05/28/2019      Chemistry      Component Value Date/Time   NA 141 03/03/2019 1542   NA 137 10/16/2017 1059   K 4.2 03/03/2019 1542   K 4.1 10/16/2017 1059   CL 99 03/03/2019 1542   CO2 22 03/03/2019 1542   CO2 25 10/16/2017 1059   BUN 13 03/03/2019 1542   BUN 20.3 10/16/2017 1059   CREATININE 1.43 (H) 03/03/2019 1542   CREATININE 1.2 (H) 10/16/2017 1059      Component Value Date/Time   CALCIUM 9.7 03/03/2019 1542   CALCIUM 9.3 10/16/2017 1059   ALKPHOS 85 03/03/2019 1542   ALKPHOS 48 10/16/2017 1059   AST 29 03/03/2019 1542   AST 17 10/16/2017 1059   ALT 38 (H) 03/03/2019 1542   ALT 25 10/16/2017 1059   BILITOT 0.3 03/03/2019 1542   BILITOT 0.40 10/16/2017 1059       No results found for: LABCA2  No components found for: LABCA125  No results for input(s): INR in the last 168 hours.  Urinalysis No results found for: COLORURINE, APPEARANCEUR, LABSPEC, PHURINE, GLUCOSEU, HGBUR, BILIRUBINUR, KETONESUR, PROTEINUR, UROBILINOGEN, NITRITE, LEUKOCYTESUR   STUDIES: No results found.  ELIGIBLE FOR AVAILABLE RESEARCH PROTOCOL: no  ASSESSMENT: 73 y.o.[place], [place]  Woman status post biopsy of 2 separate masses in the central right breast 01/29/2017, showing a clinical mT2 N0 invasive ductal carcinoma, stage IB in the new classification: Both masses being estrogen receptor and progesterone receptor positive, HER-2 not amplified, with an MIB-1 between 5 and 10%.  (1) status post bilateral mastectomies 03/05/2017 showing  (a) on the left, no malignancy  (b) On the right, a pT3 pN0, stage IIA invasive ductal carcinoma, grade 2,  with negative margins    (c) patient is not interested in reconstruction  (2) Oncotype score of 6 predicts a  10 year risk of recurrence outside the breast of 5% if the patient's only systemic therapy is tamoxifen for 5 years. It also predicts no benefit from chemotherapy.  (3) adjuvant radiation 06/05/17 - 07/20/17 Right Chest Wall/ 50.4 Gy in 28 fractions Right axillaryNodal region// 45 Gy in 25 fractions Mastectomy scar boost 10 gray in 5 fractions   (4) started anastrozole May 2018  (a) bone density at [facility] 04/24/2017 showed a T score of -0.3  (b) repeat bone density to be scheduled October 2020  (5) genetics testing through the Hereditary Gene Panel offered by [facility] found no deleterious mutations in APC, ATM, AXIN2, BARD1, BMPR1A, BRCA1, BRCA2, BRIP1, CDH1, CDKN2A (p14ARF), CDKN2A (p16INK4a), CHEK2, DICER1, EPCAM (Deletion/duplication testing only), GREM1 (promoter region deletion/duplication testing only), KIT, MEN1, MLH1, MSH2, MSH6, MUTYH, NBN, NF1, PALB2, PDGFRA, PMS2, POLD1, POLE, PTEN, RAD50, RAD51C, RAD51D, SDHB, SDHC, SDHD, SMAD4, SMARCA4. STK11, TP53, TSC1, TSC2, and VHL.  The following gene was evaluated for sequence changes only: SDHA and HOXB13 c.251G>A variant only  (a) 2 VUS were identified:  MSH6 c.3394G>C and TSC1 c.1481C>T  (6) tobacco abuse: Patient quit smoking 03/03/2017   PLAN: Kayline is now just over 2 years out from definitive surgery for her breast cancer with no evidence of disease recurrence.  This is very favorable.  She is tolerating anastrozole well and the plan will be to continue that a total of 5 years.  She is due for bone density this year.  We will obtain it in the fall.  Hopefully by then the current  pandemic will have subsided at least some.  Hot flashes and vaginal dryness are not a major issue. She never developed the arthralgias or myalgias that many patients can experience on this medication. She obtains it at a good price.  She does not have diabetes, does not drink excessively, and I am not aware of any treatment she may have received that could have  caused the neuropathy she is experiencing.  It will be interesting to see what the neurology work-up shows.  Otherwise she will return to see me in 1 year.  She knows to call for any problems that may develop before then.   Virgie Dad. Flannery Cavallero, MD  05/28/19 11:23 AM Medical Oncology and Hematology [facility] [address] [place],  [address] Tel. [phone]    Fax. [phone]   I, Wilburn Mylar, am acting as scribe for Dr. Virgie Dad. Lemond Griffee.  I, Lurline Del MD, have reviewed the above documentation for accuracy and completeness, and I agree with the above.

## 2019-05-28 ENCOUNTER — Inpatient Hospital Stay: Payer: Medicare Other | Attending: Oncology | Admitting: Oncology

## 2019-05-28 ENCOUNTER — Encounter: Payer: Self-pay | Admitting: Oncology

## 2019-05-28 ENCOUNTER — Inpatient Hospital Stay: Payer: Medicare Other

## 2019-05-28 ENCOUNTER — Other Ambulatory Visit: Payer: Self-pay

## 2019-05-28 VITALS — BP 138/70 | HR 82 | Temp 98.7°F | Resp 18 | Ht 62.0 in | Wt 188.6 lb

## 2019-05-28 DIAGNOSIS — Z79811 Long term (current) use of aromatase inhibitors: Secondary | ICD-10-CM | POA: Insufficient documentation

## 2019-05-28 DIAGNOSIS — K219 Gastro-esophageal reflux disease without esophagitis: Secondary | ICD-10-CM | POA: Diagnosis not present

## 2019-05-28 DIAGNOSIS — Z800 Family history of malignant neoplasm of digestive organs: Secondary | ICD-10-CM | POA: Insufficient documentation

## 2019-05-28 DIAGNOSIS — Z7982 Long term (current) use of aspirin: Secondary | ICD-10-CM | POA: Insufficient documentation

## 2019-05-28 DIAGNOSIS — Z90710 Acquired absence of both cervix and uterus: Secondary | ICD-10-CM | POA: Diagnosis not present

## 2019-05-28 DIAGNOSIS — E785 Hyperlipidemia, unspecified: Secondary | ICD-10-CM | POA: Diagnosis not present

## 2019-05-28 DIAGNOSIS — Z803 Family history of malignant neoplasm of breast: Secondary | ICD-10-CM | POA: Diagnosis not present

## 2019-05-28 DIAGNOSIS — Z79899 Other long term (current) drug therapy: Secondary | ICD-10-CM | POA: Insufficient documentation

## 2019-05-28 DIAGNOSIS — Z170 Estrogen receptor positive status [ER+]: Secondary | ICD-10-CM | POA: Insufficient documentation

## 2019-05-28 DIAGNOSIS — Z87891 Personal history of nicotine dependence: Secondary | ICD-10-CM | POA: Diagnosis not present

## 2019-05-28 DIAGNOSIS — E039 Hypothyroidism, unspecified: Secondary | ICD-10-CM | POA: Diagnosis not present

## 2019-05-28 DIAGNOSIS — Z90722 Acquired absence of ovaries, bilateral: Secondary | ICD-10-CM | POA: Insufficient documentation

## 2019-05-28 DIAGNOSIS — Z923 Personal history of irradiation: Secondary | ICD-10-CM | POA: Diagnosis not present

## 2019-05-28 DIAGNOSIS — I10 Essential (primary) hypertension: Secondary | ICD-10-CM | POA: Diagnosis not present

## 2019-05-28 DIAGNOSIS — R200 Anesthesia of skin: Secondary | ICD-10-CM | POA: Insufficient documentation

## 2019-05-28 DIAGNOSIS — Z9013 Acquired absence of bilateral breasts and nipples: Secondary | ICD-10-CM | POA: Diagnosis not present

## 2019-05-28 DIAGNOSIS — F329 Major depressive disorder, single episode, unspecified: Secondary | ICD-10-CM | POA: Insufficient documentation

## 2019-05-28 DIAGNOSIS — R0602 Shortness of breath: Secondary | ICD-10-CM | POA: Insufficient documentation

## 2019-05-28 DIAGNOSIS — C50411 Malignant neoplasm of upper-outer quadrant of right female breast: Secondary | ICD-10-CM

## 2019-05-28 DIAGNOSIS — Z86010 Personal history of colonic polyps: Secondary | ICD-10-CM | POA: Insufficient documentation

## 2019-05-28 LAB — CBC WITH DIFFERENTIAL/PLATELET
Abs Immature Granulocytes: 0.03 10*3/uL (ref 0.00–0.07)
Basophils Absolute: 0 10*3/uL (ref 0.0–0.1)
Basophils Relative: 0 %
Eosinophils Absolute: 0 10*3/uL (ref 0.0–0.5)
Eosinophils Relative: 0 %
HCT: 42.3 % (ref 36.0–46.0)
Hemoglobin: 13.8 g/dL (ref 12.0–15.0)
Immature Granulocytes: 0 %
Lymphocytes Relative: 13 %
Lymphs Abs: 1.1 10*3/uL (ref 0.7–4.0)
MCH: 29.6 pg (ref 26.0–34.0)
MCHC: 32.6 g/dL (ref 30.0–36.0)
MCV: 90.6 fL (ref 80.0–100.0)
Monocytes Absolute: 0.6 10*3/uL (ref 0.1–1.0)
Monocytes Relative: 7 %
Neutro Abs: 7 10*3/uL (ref 1.7–7.7)
Neutrophils Relative %: 80 %
Platelets: 330 10*3/uL (ref 150–400)
RBC: 4.67 MIL/uL (ref 3.87–5.11)
RDW: 13 % (ref 11.5–15.5)
WBC: 8.7 10*3/uL (ref 4.0–10.5)
nRBC: 0 % (ref 0.0–0.2)

## 2019-05-28 LAB — COMPREHENSIVE METABOLIC PANEL
ALT: 27 U/L (ref 0–44)
AST: 18 U/L (ref 15–41)
Albumin: 3.9 g/dL (ref 3.5–5.0)
Alkaline Phosphatase: 79 U/L (ref 38–126)
Anion gap: 12 (ref 5–15)
BUN: 18 mg/dL (ref 8–23)
CO2: 23 mmol/L (ref 22–32)
Calcium: 9.2 mg/dL (ref 8.9–10.3)
Chloride: 103 mmol/L (ref 98–111)
Creatinine, Ser: 1.29 mg/dL — ABNORMAL HIGH (ref 0.44–1.00)
GFR calc Af Amer: 48 mL/min — ABNORMAL LOW (ref >60)
GFR calc non Af Amer: 42 mL/min — ABNORMAL LOW (ref >60)
Glucose, Bld: 76 mg/dL (ref 70–99)
Potassium: 3.9 mmol/L (ref 3.5–5.1)
Sodium: 138 mmol/L (ref 135–145)
Total Bilirubin: 0.4 mg/dL (ref 0.3–1.2)
Total Protein: 7.3 g/dL (ref 6.5–8.1)

## 2019-05-28 MED ORDER — ANASTROZOLE 1 MG PO TABS: 1.0000 mg | ORAL_TABLET | Freq: Every day | ORAL | 4 refills | Status: DC

## 2019-05-29 ENCOUNTER — Telehealth: Payer: Self-pay | Admitting: Oncology

## 2019-05-29 NOTE — Telephone Encounter (Signed)
Could not reach mailbox was full mailed it

## 2019-06-10 DIAGNOSIS — Z8371 Family history of colonic polyps: Secondary | ICD-10-CM | POA: Diagnosis not present

## 2019-06-10 DIAGNOSIS — Z800 Family history of malignant neoplasm of digestive organs: Secondary | ICD-10-CM | POA: Diagnosis not present

## 2019-06-10 DIAGNOSIS — K5730 Diverticulosis of large intestine without perforation or abscess without bleeding: Secondary | ICD-10-CM | POA: Diagnosis not present

## 2019-06-10 DIAGNOSIS — K219 Gastro-esophageal reflux disease without esophagitis: Secondary | ICD-10-CM | POA: Diagnosis not present

## 2019-06-10 DIAGNOSIS — Z1211 Encounter for screening for malignant neoplasm of colon: Secondary | ICD-10-CM | POA: Diagnosis not present

## 2019-06-16 DIAGNOSIS — I10 Essential (primary) hypertension: Secondary | ICD-10-CM | POA: Diagnosis not present

## 2019-06-16 DIAGNOSIS — G4700 Insomnia, unspecified: Secondary | ICD-10-CM | POA: Diagnosis not present

## 2019-06-16 DIAGNOSIS — Z86010 Personal history of colonic polyps: Secondary | ICD-10-CM | POA: Diagnosis not present

## 2019-06-16 DIAGNOSIS — K5900 Constipation, unspecified: Secondary | ICD-10-CM | POA: Diagnosis not present

## 2019-06-16 DIAGNOSIS — I509 Heart failure, unspecified: Secondary | ICD-10-CM | POA: Diagnosis not present

## 2019-06-16 DIAGNOSIS — H918X3 Other specified hearing loss, bilateral: Secondary | ICD-10-CM | POA: Diagnosis not present

## 2019-06-16 DIAGNOSIS — F322 Major depressive disorder, single episode, severe without psychotic features: Secondary | ICD-10-CM | POA: Diagnosis not present

## 2019-06-18 ENCOUNTER — Ambulatory Visit (INDEPENDENT_AMBULATORY_CARE_PROVIDER_SITE_OTHER): Payer: Medicare Other | Admitting: Neurology

## 2019-06-18 ENCOUNTER — Encounter: Payer: Self-pay | Admitting: Neurology

## 2019-06-18 ENCOUNTER — Other Ambulatory Visit: Payer: Self-pay

## 2019-06-18 DIAGNOSIS — G603 Idiopathic progressive neuropathy: Secondary | ICD-10-CM

## 2019-06-18 DIAGNOSIS — R202 Paresthesia of skin: Secondary | ICD-10-CM

## 2019-06-18 DIAGNOSIS — G479 Sleep disorder, unspecified: Secondary | ICD-10-CM

## 2019-06-18 DIAGNOSIS — R2689 Other abnormalities of gait and mobility: Secondary | ICD-10-CM

## 2019-06-18 DIAGNOSIS — R296 Repeated falls: Secondary | ICD-10-CM

## 2019-06-18 DIAGNOSIS — R200 Anesthesia of skin: Secondary | ICD-10-CM

## 2019-06-18 DIAGNOSIS — G629 Polyneuropathy, unspecified: Secondary | ICD-10-CM

## 2019-06-18 HISTORY — DX: Polyneuropathy, unspecified: G62.9

## 2019-06-18 NOTE — Progress Notes (Signed)
Please refer to EMG and nerve conduction procedure note.  

## 2019-06-18 NOTE — Procedures (Signed)
     HISTORY:  Kimberly Waters is a 73 year old patient with a history of progressive numbness of the feet and gait instability, she is now having to use a cane or a walker to get around.  She denies any back pain or pain down the legs or significant discomfort in the feet although she feels as if her feet are swollen.  The patient is being evaluated for possible neuropathy as a cause for her symptoms above.  NERVE CONDUCTION STUDIES:  Nerve conduction studies were performed on both lower extremities.  The distal motor latencies for the peroneal and posterior tibial nerves were within normal limits bilaterally with a low motor amplitudes seen for the left posterior tibial nerve and for the right peroneal nerve.  Significant slowing was noted above and below the fibular head for the right peroneal nerve, borderline normal nerve conduction velocities were noted for the left peroneal nerve and slowing was seen for the left posterior tibial nerve, borderline normal on the right.  The sensory latencies for the left sural nerve and for the peroneal nerves were unobtainable, slightly prolonged for the right sural nerve.  The F-wave latencies for the posterior tibial nerves were prolonged bilaterally.  EMG STUDIES:  EMG study was performed on the right lower extremity:  The tibialis anterior muscle reveals 2 to 5K motor units with decreased recruitment. No fibrillations or positive waves were seen. The peroneus tertius muscle reveals 2 to 5K motor units with decreased recruitment. No fibrillations or positive waves were seen. The medial gastrocnemius muscle reveals 1 to 3K motor units with decreased recruitment. No fibrillations or positive waves were seen. The vastus lateralis muscle reveals 2 to 4K motor units with full recruitment. No fibrillations or positive waves were seen. The iliopsoas muscle reveals 2 to 4K motor units with full recruitment. No fibrillations or positive waves were seen. The biceps  femoris muscle (long head) reveals 2 to 4K motor units with full recruitment. No fibrillations or positive waves were seen. The lumbosacral paraspinal muscles were tested at 3 levels, and revealed no abnormalities of insertional activity at all 3 levels tested. There was good relaxation.   IMPRESSION:  Nerve conduction studies done on both lower extremities shows evidence of a mild to moderate peripheral neuropathy with mixed demyelinating and axonal features.  EMG evaluation of the right lower extremity shows chronic stable neuropathic signs of denervation distally consistent with the diagnosis of peripheral neuropathy.  No evidence of an overlying lumbosacral radiculopathy was seen.  Jill Alexanders MD 06/18/2019 1:44 PM  [facility] [address] [place], [place] [address]  Phone [phone] Fax [phone]

## 2019-06-19 ENCOUNTER — Telehealth: Payer: Self-pay

## 2019-06-19 NOTE — Progress Notes (Signed)
[place]    Nerve / Sites Muscle Latency Ref. Amplitude Ref. Rel Amp Segments Distance Velocity Ref. Area    ms ms mV mV %  cm m/s m/s mVms  R Peroneal - EDB     Ankle EDB 6.5 ?6.5 0.7 ?2.0 100 Ankle - EDB 9   1.5     Fib head EDB 19.0  0.6  91.3 Fib head - Ankle 26 21 ?44 1.7     Pop fossa EDB 27.0  0.1  14.1 Pop fossa - Fib head 10 12 ?44 0.3         Pop fossa - Ankle      L Peroneal - EDB     Ankle EDB 5.4 ?6.5 4.6 ?2.0 100 Ankle - EDB 9   12.9     Fib head EDB 11.8  3.7  81 Fib head - Ankle 28 44 ?44 12.1     Pop fossa EDB 13.8  3.3  88.2 Pop fossa - Fib head 9 44 ?44 11.3         Pop fossa - Ankle      R Tibial - AH     Ankle AH 4.7 ?5.8 4.2 ?4.0 100 Ankle - AH 9   12.4     Pop fossa AH 13.8  2.8  66 Pop fossa - Ankle 37 41 ?41 9.1  L Tibial - AH     Ankle AH 4.3 ?5.8 1.8 ?4.0 100 Ankle - AH 9   4.5     Pop fossa AH 14.0  1.5  87.7 Pop fossa - Ankle 37 38 ?41 5.3             SNC    Nerve / Sites Rec. Site Peak Lat Ref.  Amp Ref. Segments Distance    ms ms V V  cm  R Sural - Ankle (Calf)     Calf Ankle 4.5 ?4.4 5 ?6 Calf - Ankle 14  L Sural - Ankle (Calf)     Calf Ankle NR ?4.4 NR ?6 Calf - Ankle 14  R Superficial peroneal - Ankle     Lat leg Ankle NR ?4.4 NR ?6 Lat leg - Ankle 14  L Superficial peroneal - Ankle     Lat leg Ankle NR ?4.4 NR ?6 Lat leg - Ankle 14              F  Wave    Nerve F Lat Ref.   ms ms  R Tibial - AH 75.0 ?56.0  L Tibial - AH 59.2 ?56.0

## 2019-06-19 NOTE — Progress Notes (Signed)
Please call the patient, her EMG and nerve conduction study which is the electrical nerve and muscle test she had showed evidence of mild to moderate neuropathy meaning nerve damage, the cause is not fully clear in her case. Certain medications can cause nerve damage, which may include meds like arimidex, but neuropathy is not a common side effect as listed. She also had a small abnormality in her blood test which is the protein electrophoresis test and I had made a referral to hematology.  She has since then actually seen her oncologist recently but it would be helpful if she would talk to him about the blood test finding.  I had actually placed a referral to hematology separately in March, but it does not look like she was scheduled with anybody to discuss the blood test abnormality.  I had also ordered a brain MRI, I do not believe she is scheduled for this yet.  Please inquire.  Michel Bickers

## 2019-06-19 NOTE — Telephone Encounter (Signed)
I called pt and had an extended conversation about her NCV/EMG results and recommendations. She does not remember being contacted about her MRI. I tried to give her GI's phone number but she wants GI to call her. She says that she will discuss the protein abnormality with her oncologist. Pt verbalized understanding of results. Pt had no questions at this time but was encouraged to call back if questions arise.

## 2019-06-19 NOTE — Telephone Encounter (Signed)
-----   Message from Star Age, MD sent at 06/19/2019  3:58 PM EDT ----- Please call the patient, her EMG and nerve conduction study which is the electrical nerve and muscle test she had showed evidence of mild to moderate neuropathy meaning nerve damage, the cause is not fully clear in her case. Certain medications can cause nerve damage, which may include meds like arimidex, but neuropathy is not a common side effect as listed. She also had a small abnormality in her blood test which is the protein electrophoresis test and I had made a referral to hematology.  She has since then actually seen her oncologist recently but it would be helpful if she would talk to him about the blood test finding.  I had actually placed a referral to hematology separately in March, but it does not look like she was scheduled with anybody to discuss the blood test abnormality.  I had also ordered a brain MRI, I do not believe she is scheduled for this yet.  Please inquire.  Michel Bickers

## 2019-06-23 NOTE — Telephone Encounter (Signed)
I called pt back. She still has not heard from GI and is asking for a call from them. Pt wants a referral to hematology as discussed in March. She says that she has not heard back from them and would rather see a hematologist rather than her oncologist. Pt is asking for her labs and NCV/EMG to be mailed to her address.

## 2019-06-23 NOTE — Telephone Encounter (Signed)
Noted, will do

## 2019-06-23 NOTE — Telephone Encounter (Signed)
Pt is requesting a call back to discuss results

## 2019-06-23 NOTE — Telephone Encounter (Signed)
I will resend the order to GI.. but GI has tried to reach out to the patient three times.

## 2019-06-24 NOTE — Telephone Encounter (Signed)
Patient has been going to Haematology she just went 05/28/2019 and she is not scheduled to come back until 05/27/2020 per the cancer center. [phone]

## 2019-06-26 ENCOUNTER — Telehealth: Payer: Self-pay | Admitting: *Deleted

## 2019-06-26 ENCOUNTER — Other Ambulatory Visit: Payer: Self-pay | Admitting: Oncology

## 2019-06-26 DIAGNOSIS — C50411 Malignant neoplasm of upper-outer quadrant of right female breast: Secondary | ICD-10-CM

## 2019-06-26 DIAGNOSIS — Z170 Estrogen receptor positive status [ER+]: Secondary | ICD-10-CM

## 2019-06-26 DIAGNOSIS — D472 Monoclonal gammopathy: Secondary | ICD-10-CM | POA: Insufficient documentation

## 2019-06-26 NOTE — Telephone Encounter (Signed)
Thanks Cyril Mourning I will Make sure she is scheduled .

## 2019-06-26 NOTE — Telephone Encounter (Signed)
I called Kimberly Waters, had another extended conversation with her regarding her lab work. I reminded her again that one of her proteins her blood, called IgM, is elevated and Dr. Rexene Alberts recommends input from another specialist, called a hematologist. This referral was placed. I am unsure why Kimberly Waters cannot be scheduled with a hematologist and Kimberly Waters is unsure as well. I will ask Hinton Dyer to send referral to a hematology office. Kimberly Waters verbalized understanding.

## 2019-06-26 NOTE — Telephone Encounter (Addendum)
-----   Message from Nena Polio sent at 06/26/2019 10:23 AM EDT ----- Dr. Shelia Media referred Ms. Kimberly Waters for monoclonal gammopathy. She was last seen on 6/3. Please fwd an appt to scheduling for an appt. Labs are in Epic from March.  Thanks Seth Bake  This RN reviewed chart and note referral as well as labs from March 2020 and noted referral- called placed to pt to discuss need for additional labs for md review for appropriate follow up as well as her communication  per Dr Pennie Banter office of request to see a hematologist not an oncologist and this RN wanted to discuss Dr Jannifer Rodney is also a hematologist- or does she want to see someone else in this practice for abnormal protein work up.  ( note pt was seen in June by Dr Jannifer Rodney but for her hx of breast ca ).  [name] returned call to this RN - discussed above with [name] stating " oh I would much rather see Dr Jana Hakim - I love him "  This RN explained next step is to obtain additional blood work and then see Dr Jannifer Rodney.  Appointments will be made for above.

## 2019-06-26 NOTE — Telephone Encounter (Signed)
Patient called back and she is a little confused I relayed to her she needs to call Donetta Potts to schedule MRI at [phone] .   Patient is already established patient at cancer center she is not scheduled to come back to next year if she needs to be seen for something new she needs a new referral.   Patients wants you to call her back and explain her labs in detail because she does not no why she is going to the cancer center again and about her labs . Patient wants a copy of her labs mailed.

## 2019-06-30 ENCOUNTER — Telehealth: Payer: Self-pay | Admitting: Oncology

## 2019-06-30 ENCOUNTER — Other Ambulatory Visit: Payer: Self-pay | Admitting: Oncology

## 2019-06-30 NOTE — Telephone Encounter (Signed)
Scheduled appt per 7/02 sch message - pt is aware of appt date and time

## 2019-07-01 NOTE — Telephone Encounter (Signed)
Noted  

## 2019-07-03 ENCOUNTER — Other Ambulatory Visit: Payer: Self-pay | Admitting: Oncology

## 2019-07-07 ENCOUNTER — Ambulatory Visit
Admission: RE | Admit: 2019-07-07 | Discharge: 2019-07-07 | Disposition: A | Discharge status: Home / Self Care | Payer: Medicare Other | Source: Ambulatory Visit | Attending: Neurology | Admitting: Neurology

## 2019-07-07 DIAGNOSIS — R200 Anesthesia of skin: Secondary | ICD-10-CM

## 2019-07-07 DIAGNOSIS — R296 Repeated falls: Secondary | ICD-10-CM

## 2019-07-07 DIAGNOSIS — G479 Sleep disorder, unspecified: Secondary | ICD-10-CM

## 2019-07-07 DIAGNOSIS — G3281 Cerebellar ataxia in diseases classified elsewhere: Secondary | ICD-10-CM

## 2019-07-07 DIAGNOSIS — R2689 Other abnormalities of gait and mobility: Secondary | ICD-10-CM

## 2019-07-08 ENCOUNTER — Telehealth: Payer: Self-pay

## 2019-07-08 NOTE — Telephone Encounter (Signed)
Pt returned call. Please call back as soon as available.  °

## 2019-07-08 NOTE — Progress Notes (Signed)
Please call patient regarding the recent brain MRI: The brain scan showed a normal structure of the brain and age-appropriate size without advanced for age volume loss which we call atrophy, this is reassuring. There were changes in the deeper structures of the brain, which we call white matter changes or microvascular changes. These were reported as mild in Her case. These are tiny white spots, that occur with time and are seen in a variety of conditions, including with normal aging, chronic hypertension, chronic headaches, especially migraine HAs, chronic diabetes, chronic hyperlipidemia. These are not strokes and no mass or lesion were seen which is also reassuring. Again, there were no acute findings, such as a stroke, or mass or blood products. No further action is required on this test at this time, other than re-enforcing the importance of good blood pressure control, good cholesterol control, good blood sugar control, and weight management. Please remind patient to keep any upcoming appointments or tests and to call us with any interim questions, concerns, problems or updates. Thanks,  Star Age, MD, PhD

## 2019-07-08 NOTE — Telephone Encounter (Signed)
I called pt to discuss her MRI results. No answer, VM is full, will try again later.

## 2019-07-08 NOTE — Telephone Encounter (Signed)
-----   Message from Star Age, MD sent at 07/08/2019  9:01 AM EDT ----- Please call patient regarding the recent brain MRI: The brain scan showed a normal structure of the brain and age-appropriate size without advanced for age volume loss which we call atrophy, this is reassuring. There were changes in the deeper structures of the brain, which we call white matter changes or microvascular changes. These were reported as mild in Her case. These are tiny white spots, that occur with time and are seen in a variety of conditions, including with normal aging, chronic hypertension, chronic headaches, especially migraine HAs, chronic diabetes, chronic hyperlipidemia. These are not strokes and no mass or lesion were seen which is also reassuring. Again, there were no acute findings, such as a stroke, or mass or blood products. No further action is required on this test at this time, other than re-enforcing the importance of good blood pressure control, good cholesterol control, good blood sugar control, and weight management. Please remind patient to keep any upcoming appointments or tests and to call us with any interim questions, concerns, problems or updates. Thanks,  Star Age, MD, PhD

## 2019-07-08 NOTE — Telephone Encounter (Signed)
I called pt, discussed her MRI results and recommendations. Pt verbalized understanding of results. Pt had no questions at this time but was encouraged to call back if questions arise.

## 2019-07-09 ENCOUNTER — Telehealth: Payer: Self-pay | Admitting: *Deleted

## 2019-07-09 NOTE — Telephone Encounter (Signed)
   [facility] Pre-operative Risk Assessment    Request for surgical clearance:  1. What type of surgery is being performed? Colonoscopy   2. When is this surgery scheduled? 07/16/19   3. What type of clearance is required (medical clearance vs. Pharmacy clearance to hold med vs. Both)? medical  4. Are there any medications that need to be held prior to surgery and how long? Advise if medication needs to be held  5. Practice name and name of physician performing surgery?   [facility]  Dr. Collene Mares  6. What is your office phone number: [phone]    7.   What is your office fax number: [phone]  8.   Anesthesia type (None, local, MAC, general) ?    Kimberly Waters 07/09/2019, 11:57 AM  _________________________________________________________________   (provider comments below)

## 2019-07-09 NOTE — Telephone Encounter (Signed)
Attempted to reach pt to schedule her a appt for surgical clearance. No answer. VM full

## 2019-07-09 NOTE — Telephone Encounter (Signed)
   Primary Cardiologist:Mark Marlou Porch, MD  Chart reviewed as part of pre-operative protocol coverage. Because of Kimberly Waters's past medical history and time since last visit, he/she will require a follow-up visit in order to better assess preoperative cardiovascular risk.  Pt needs OV tomorrow or Monday has been a year since last visit   Pre-op covering staff: - Please schedule appointment and call patient to inform them. - Please contact requesting surgeon's office via preferred method (i.e, phone, fax) to inform them of need for appointment prior to surgery.  If applicable, this message will also be routed to pharmacy pool and/or primary cardiologist for input on holding anticoagulant/antiplatelet agent as requested below so that this information is available at time of patient's appointment.   Cecilie Kicks, NP  07/09/2019, 2:01 PM

## 2019-07-10 DIAGNOSIS — L2084 Intrinsic (allergic) eczema: Secondary | ICD-10-CM | POA: Diagnosis not present

## 2019-07-10 NOTE — Telephone Encounter (Signed)
Spoke with patient and she was agreeable with appt to see Almyra Deforest tomorrow for cardiac clearance. Gave patient directions to our office. Advised patient to come alone and wear a mask. Reviewed covid-19 questions with patient. Patient voiced understanding.           COVID-19 Pre-Screening Questions:  . In the past 7 to 10 days have you had a cough,  shortness of breath, headache, congestion, fever (100 or greater) body aches, chills, sore throat, or sudden loss of taste or sense of smell?NO . Have you been around anyone with known Covid 19.NO . Have you been around anyone who is awaiting Covid 19 test results in the past 7 to 10 days?NO . Have you been around anyone who has been exposed to Covid 19, or has mentioned symptoms of Covid 19 within the past 7 to 10 days?NO  If you have any concerns/questions about symptoms patients report during screening (either on the phone or at threshold). Contact the provider seeing the patient or DOD for further guidance.  If neither are available contact a member of the leadership team.

## 2019-07-11 ENCOUNTER — Other Ambulatory Visit: Payer: Self-pay

## 2019-07-11 ENCOUNTER — Encounter: Payer: Self-pay | Admitting: Physician Assistant

## 2019-07-11 ENCOUNTER — Ambulatory Visit (INDEPENDENT_AMBULATORY_CARE_PROVIDER_SITE_OTHER): Payer: Medicare Other | Admitting: Physician Assistant

## 2019-07-11 VITALS — BP 124/70 | HR 92 | Ht 62.0 in | Wt 188.6 lb

## 2019-07-11 DIAGNOSIS — E782 Mixed hyperlipidemia: Secondary | ICD-10-CM

## 2019-07-11 DIAGNOSIS — I10 Essential (primary) hypertension: Secondary | ICD-10-CM

## 2019-07-11 DIAGNOSIS — Z01818 Encounter for other preprocedural examination: Secondary | ICD-10-CM

## 2019-07-11 DIAGNOSIS — I5032 Chronic diastolic (congestive) heart failure: Secondary | ICD-10-CM | POA: Diagnosis not present

## 2019-07-11 NOTE — Patient Instructions (Addendum)
Medication Instructions:  Your physician recommends that you continue on your current medications as directed. Please refer to the Current Medication list given to you today.  If you need a refill on your cardiac medications before your next appointment, please call your pharmacy.   Lab work: NONE ordered at this time of appointment   If you have labs (blood work) drawn today and your tests are completely normal, you will receive your results only by: Marland Kitchen MyChart Message (if you have MyChart) OR . A paper copy in the mail If you have any lab test that is abnormal or we need to change your treatment, we will call you to review the results.  Testing/Procedures: NONE ordered at this time of appointment   Follow-Up: At [facility], you and your health needs are our priority.  As part of our continuing mission to provide you with exceptional heart care, we have created designated Provider Care Teams.  These Care Teams include your primary Cardiologist (physician) and Advanced Practice Providers (APPs -  Physician Assistants and Nurse Practitioners) who all work together to provide you with the care you need, when you need it. You will need a follow up appointment in 8-12  months.  Please call our office 2 months in advance to schedule this appointment.  You may see Candee Furbish, MD or one of the following Advanced Practice Providers on your designated Care Team:   Truitt Merle, NP Cecilie Kicks, NP . Kathyrn Drown, NP  Any Other Special Instructions Will Be Listed Below (If Applicable).   Cleared for surgery/procedure

## 2019-07-11 NOTE — Progress Notes (Signed)
Cardiology Office Note    Date:  07/13/2019   ID:  Kimberly Waters, DOB [birth year], MRN [id number]  PCP:  Josetta Huddle, MD  Cardiologist:  Dr. Marlou Porch  Chief Complaint  Patient presents with  . Follow-up    seen for Dr. Marlou Porch.     History of Present Illness:  Kimberly Waters is a 73 y.o. female with PMH of chronic diastolic heart failure, hypertension, hyperlipidemia, persistent asthma and history of breast cancer s/p mastectomy.  Patient was previously evaluated for atypical chest pain in 2019.  Echocardiogram obtained on 03/29/2018 showed EF 55 to 60%, grade 1 DD, mild MR, peak PA pressure 29 mmHg.  Myoview obtained on 05/31/2018 was normal with inferior and apical thinning consistent with soft tissue attenuation however no ischemia or scar, overall low risk.   Patient presents today for 36-month follow-up and also preoperative clearance prior to her colonoscopy by Dr. Collene Mares on 07/16/2019.  She has not noticed any recent chest pain or exertional shortness of breath.  She is still on 40 mg daily of Lasix.  Recent lab work shows stable renal function and electrolyte.  Patient is stable from a heart perspective.  Lung exam is clear without any wheezing or crackles.  She is cleared to proceed with colonoscopy.  Otherwise she may follow-up with Dr. Marlou Porch in 9 to 12 months.   Past Medical History:  Diagnosis Date  . Allergy   . Breast cancer ([place])   . Colon polyps   . Depression   . Dyspnea    SEASONAL  . Family history of breast cancer   . Family history of colon cancer   . GERD (gastroesophageal reflux disease)   . History of radiation therapy 06/05/17-07/20/17   right chest wall 50.4 Gy in 28 fractions, right axillary nodal region 45 Gy in 25 fractions  . Hyperlipidemia   . Hypertension   . Hypothyroidism   . Peripheral neuropathy 06/18/2019  . PONV (postoperative nausea and vomiting)     Past Surgical History:  Procedure Laterality Date  . ABDOMINAL HYSTERECTOMY    . KNEE SURGERY  Right   . MASTECTOMY W/ SENTINEL NODE BIOPSY Right 03/05/2017   Procedure: BILATERAL TOTAL MASTECTOMIES WITH RIGHT SENTINEL LYMPH NODE BIOPSIES;  Surgeon: Excell Seltzer, MD;  Location: [facility];  Service: General;  Laterality: Right;  . SHOULDER SURGERY     BILAT    Current Medications: Outpatient Medications Prior to Visit  Medication Sig Dispense Refill  . albuterol (PROVENTIL,VENTOLIN) 90 MCG/ACT inhaler Inhale 2 puffs into the lungs every 4 (four) hours as needed. (Patient taking differently: Inhale 2 puffs into the lungs every 4 (four) hours as needed for wheezing or shortness of breath. ) 17 g 3  . anastrozole (ARIMIDEX) 1 MG tablet Take 1 tablet (1 mg total) by mouth daily. 90 tablet 4  . aspirin EC 81 MG EC tablet Take 1 tablet (81 mg total) by mouth daily.    . budesonide-formoterol (SYMBICORT) 80-4.5 MCG/ACT inhaler Inhale 2 puffs into the lungs 2 (two) times daily. 1 Inhaler 11  . buPROPion (WELLBUTRIN SR) 150 MG 12 hr tablet     . diphenhydrAMINE (BENADRYL) 25 MG tablet Take 25 mg by mouth as needed for sleep.    Marland Kitchen escitalopram (LEXAPRO) 10 MG tablet Take 1 tablet (10 mg total) by mouth daily. 30 tablet 0  . furosemide (LASIX) 40 MG tablet Take 1 tablet (40 mg total) by mouth daily. 30 tablet 0  . hydrOXYzine (ATARAX/VISTARIL)  25 MG tablet Take 25 mg by mouth at bedtime.    Marland Kitchen imipramine (TOFRANIL) 50 MG tablet Take 50 mg by mouth at bedtime.    Marland Kitchen ipratropium (ATROVENT) 0.06 % nasal spray Place 2 sprays into the nose 4 (four) times daily. 15 mL 3  . levothyroxine (SYNTHROID, LEVOTHROID) 112 MCG tablet TAKE ONE TABLET BY MOUTH ONCE DAILY (Patient taking differently: Take 112 mcg by mouth daily before breakfast. ) 30 tablet 0  . lisinopril (PRINIVIL,ZESTRIL) 2.5 MG tablet Take 1 tablet (2.5 mg total) by mouth daily. 30 tablet 0  . omeprazole (PRILOSEC) 20 MG capsule Take 20 mg by mouth daily.    . potassium chloride SA (K-DUR,KLOR-CON) 20 MEQ tablet Take 1 tablet (20 mEq total) by  mouth daily. 30 tablet 0  . simvastatin (ZOCOR) 20 MG tablet Take 1 tablet (20 mg total) by mouth at bedtime. 90 tablet 0  . Tiotropium Bromide Monohydrate (SPIRIVA RESPIMAT) 2.5 MCG/ACT AERS Inhale 2 puffs into the lungs daily. 1 Inhaler 11  . Melatonin 10 MG TABS Take by mouth.     No facility-administered medications prior to visit.      Allergies:   Adhesive [tape], Codeine, and Other   Social History   Socioeconomic History  . Marital status: Married    Spouse name: Not on file  . Number of children: 2  . Years of education: Not on file  . Highest education level: Not on file  Occupational History  . Occupation: reitred [employer]  . Financial resource strain: Not on file  . Food insecurity    Worry: Not on file    Inability: Not on file  . Transportation needs    Medical: Not on file    Non-medical: Not on file  Tobacco Use  . Smoking status: Former Smoker    Packs/day: 1.00    Years: 45.00    Pack years: 45.00    Types: Cigarettes    Quit date: 02/22/2017    Years since quitting: 2.3  . Smokeless tobacco: Never Used  Substance and Sexual Activity  . Alcohol use: No    Alcohol/week: 0.0 standard drinks  . Drug use: No  . Sexual activity: Not on file  Lifestyle  . Physical activity    Days per week: Not on file    Minutes per session: Not on file  . Stress: Not on file  Relationships  . Social Herbalist on phone: Not on file    Gets together: Not on file    Attends religious service: Not on file    Active member of club or organization: Not on file    Attends meetings of clubs or organizations: Not on file    Relationship status: Not on file  Other Topics Concern  . Not on file  Social History Narrative  . Not on file     Family History:  The patient's family history includes Breast cancer (age of onset: 78) in her cousin; Breast cancer (age of onset: 13) in her paternal aunt; Colon cancer in her maternal uncle and  maternal uncle; Colon cancer (age of onset: 54) in her maternal grandfather; Colon cancer (age of onset: 70) in her maternal aunt; Head & neck cancer in her maternal uncle; Heart disease (age of onset: 16) in her mother; Lung cancer in her paternal grandfather.   ROS:   Please see the history of present illness.    ROS All other systems  reviewed and are negative.   PHYSICAL EXAM:   VS:  BP 124/70   Pulse 92   Ht 5\' 2"  (1.575 m)   Wt 188 lb 9.6 oz (85.5 kg)   SpO2 93%   BMI 34.50 kg/m    GEN: Well nourished, well developed, in no acute distress  HEENT: normal  Neck: no JVD, carotid bruits, or masses Cardiac: RRR; no murmurs, rubs, or gallops,no edema  Respiratory:  clear to auscultation bilaterally, normal work of breathing GI: soft, nontender, nondistended, + BS MS: no deformity or atrophy  Skin: warm and dry, no rash Neuro:  Alert and Oriented x 3, Strength and sensation are intact Psych: euthymic mood, full affect  Wt Readings from Last 3 Encounters:  07/11/19 188 lb 9.6 oz (85.5 kg)  05/28/19 188 lb 9.6 oz (85.5 kg)  03/03/19 195 lb (88.5 kg)      Studies/Labs Reviewed:   EKG:  EKG is ordered today.  The ekg ordered today demonstrates normal sinus rhythm with left bundle branch block  Recent Labs: 05/28/2019: ALT 27; BUN 18; Creatinine, Ser 1.29; Hemoglobin 13.8; Platelets 330; Potassium 3.9; Sodium 138   Lipid Panel    Component Value Date/Time   CHOL 188 12/09/2008 2139   TRIG 216 (H) 12/09/2008 2139   HDL 39 (L) 12/09/2008 2139   CHOLHDL 4.8 Ratio 12/09/2008 2139   VLDL 43 (H) 12/09/2008 2139   LDLCALC 106 (H) 12/09/2008 2139   LDLDIRECT 120 (H) 02/12/2013 1536    Additional studies/ records that were reviewed today include:   Echo 03/29/2018 LV EF: 55% -   60% Study Conclusions  - Left ventricle: The cavity size was normal. There was moderate   concentric hypertrophy. Systolic function was normal. The   estimated ejection fraction was in the range of  55% to 60%. Wall   motion was normal; there were no regional wall motion   abnormalities. Doppler parameters are consistent with abnormal   left ventricular relaxation (grade 1 diastolic dysfunction). - Aortic valve: There was no regurgitation. - Mitral valve: Transvalvular velocity was within the normal range.   There was no evidence for stenosis. There was mild regurgitation. - Left atrium: The atrium was mildly dilated. - Right ventricle: The cavity size was normal. Wall thickness was   normal. Systolic function was normal. - Tricuspid valve: There was trivial regurgitation. - Pulmonary arteries: Systolic pressure was within the normal   range. PA peak pressure: 29 mm Hg (S).   Myoview 05/31/2018 Study Highlights   Nuclear stress EF: 59%.  Inferior and apical thnning consistent with prabable soft tissue attenuation and normal perfusion No significant ischemia or scar  Low risk     ASSESSMENT:    1. Pre-procedural examination   2. Chronic diastolic congestive heart failure ([place])   3. HYPERTENSION, BENIGN SYSTEMIC   4. Mixed hyperlipidemia      PLAN:  In order of problems listed above:  1. Preoperative clearance: Patient is at acceptable risk to proceed with colonoscopy by Dr. Collene Mares.  Last Myoview in 2019 was normal.  May hold aspirin for 5 days if needed.  2. Chronic diastolic heart failure: Euvolemic on physical exam  3. Hypertension: Blood pressure stable  4. Hyperlipidemia: On Zocor 20 mg daily    Medication Adjustments/Labs and Tests Ordered: Current medicines are reviewed at length with the patient today.  Concerns regarding medicines are outlined above.  Medication changes, Labs and Tests ordered today are listed in the Patient Instructions below. Patient  Instructions  Medication Instructions:  Your physician recommends that you continue on your current medications as directed. Please refer to the Current Medication list given to you today.  If you need a  refill on your cardiac medications before your next appointment, please call your pharmacy.   Lab work: NONE ordered at this time of appointment   If you have labs (blood work) drawn today and your tests are completely normal, you will receive your results only by: Marland Kitchen MyChart Message (if you have MyChart) OR . A paper copy in the mail If you have any lab test that is abnormal or we need to change your treatment, we will call you to review the results.  Testing/Procedures: NONE ordered at this time of appointment   Follow-Up: At [facility], you and your health needs are our priority.  As part of our continuing mission to provide you with exceptional heart care, we have created designated Provider Care Teams.  These Care Teams include your primary Cardiologist (physician) and Advanced Practice Providers (APPs -  Physician Assistants and Nurse Practitioners) who all work together to provide you with the care you need, when you need it. You will need a follow up appointment in 8-12  months.  Please call our office 2 months in advance to schedule this appointment.  You may see Candee Furbish, MD or one of the following Advanced Practice Providers on your designated Care Team:   Truitt Merle, NP Cecilie Kicks, NP . Kathyrn Drown, NP  Any Other Special Instructions Will Be Listed Below (If Applicable).   Cleared for surgery/procedure     Signed, Almyra Deforest, [place]  07/13/2019 10:16 PM    [facility] [address], [place], [place]  [address] Phone: [phone]; Fax: [phone]

## 2019-07-11 NOTE — Progress Notes (Signed)
   Primary Cardiologist: Candee Furbish, MD  Chart reviewed as part of pre-operative protocol coverage. Given past medical history and time since last visit, based on ACC/AHA guidelines, ABRIANNA SIDMAN would be at acceptable risk for the planned procedure without further cardiovascular testing. Colonoscopy is a low risk procedure. If needed, she may hold aspirin for 5 days prior to the procedure and restart after the procedure.   Please call with questions.  [place], [place] 07/11/2019, 3:23 PM

## 2019-07-13 ENCOUNTER — Encounter: Payer: Self-pay | Admitting: Physician Assistant

## 2019-07-16 DIAGNOSIS — Z1211 Encounter for screening for malignant neoplasm of colon: Secondary | ICD-10-CM | POA: Diagnosis not present

## 2019-07-16 DIAGNOSIS — Z800 Family history of malignant neoplasm of digestive organs: Secondary | ICD-10-CM | POA: Diagnosis not present

## 2019-07-16 DIAGNOSIS — D122 Benign neoplasm of ascending colon: Secondary | ICD-10-CM | POA: Diagnosis not present

## 2019-07-16 DIAGNOSIS — K635 Polyp of colon: Secondary | ICD-10-CM | POA: Diagnosis not present

## 2019-07-16 DIAGNOSIS — Z8371 Family history of colonic polyps: Secondary | ICD-10-CM | POA: Diagnosis not present

## 2019-07-17 ENCOUNTER — Other Ambulatory Visit: Payer: Self-pay

## 2019-07-17 ENCOUNTER — Inpatient Hospital Stay: Payer: Medicare Other | Attending: Oncology

## 2019-07-17 DIAGNOSIS — Z9013 Acquired absence of bilateral breasts and nipples: Secondary | ICD-10-CM | POA: Insufficient documentation

## 2019-07-17 DIAGNOSIS — Z801 Family history of malignant neoplasm of trachea, bronchus and lung: Secondary | ICD-10-CM | POA: Insufficient documentation

## 2019-07-17 DIAGNOSIS — C50411 Malignant neoplasm of upper-outer quadrant of right female breast: Secondary | ICD-10-CM | POA: Diagnosis not present

## 2019-07-17 DIAGNOSIS — R232 Flushing: Secondary | ICD-10-CM | POA: Diagnosis not present

## 2019-07-17 DIAGNOSIS — Z79899 Other long term (current) drug therapy: Secondary | ICD-10-CM | POA: Insufficient documentation

## 2019-07-17 DIAGNOSIS — Z90710 Acquired absence of both cervix and uterus: Secondary | ICD-10-CM | POA: Diagnosis not present

## 2019-07-17 DIAGNOSIS — F329 Major depressive disorder, single episode, unspecified: Secondary | ICD-10-CM | POA: Insufficient documentation

## 2019-07-17 DIAGNOSIS — E785 Hyperlipidemia, unspecified: Secondary | ICD-10-CM | POA: Diagnosis not present

## 2019-07-17 DIAGNOSIS — Z170 Estrogen receptor positive status [ER+]: Secondary | ICD-10-CM | POA: Insufficient documentation

## 2019-07-17 DIAGNOSIS — G629 Polyneuropathy, unspecified: Secondary | ICD-10-CM | POA: Insufficient documentation

## 2019-07-17 DIAGNOSIS — I10 Essential (primary) hypertension: Secondary | ICD-10-CM | POA: Insufficient documentation

## 2019-07-17 DIAGNOSIS — M797 Fibromyalgia: Secondary | ICD-10-CM | POA: Diagnosis not present

## 2019-07-17 DIAGNOSIS — Z79811 Long term (current) use of aromatase inhibitors: Secondary | ICD-10-CM | POA: Diagnosis not present

## 2019-07-17 DIAGNOSIS — Z90722 Acquired absence of ovaries, bilateral: Secondary | ICD-10-CM | POA: Diagnosis not present

## 2019-07-17 DIAGNOSIS — Z803 Family history of malignant neoplasm of breast: Secondary | ICD-10-CM | POA: Diagnosis not present

## 2019-07-17 DIAGNOSIS — E039 Hypothyroidism, unspecified: Secondary | ICD-10-CM | POA: Insufficient documentation

## 2019-07-17 DIAGNOSIS — Z7982 Long term (current) use of aspirin: Secondary | ICD-10-CM | POA: Diagnosis not present

## 2019-07-17 DIAGNOSIS — Z800 Family history of malignant neoplasm of digestive organs: Secondary | ICD-10-CM | POA: Diagnosis not present

## 2019-07-17 DIAGNOSIS — Z87891 Personal history of nicotine dependence: Secondary | ICD-10-CM | POA: Diagnosis not present

## 2019-07-17 DIAGNOSIS — Z9011 Acquired absence of right breast and nipple: Secondary | ICD-10-CM | POA: Insufficient documentation

## 2019-07-17 DIAGNOSIS — Z86010 Personal history of colonic polyps: Secondary | ICD-10-CM | POA: Insufficient documentation

## 2019-07-17 DIAGNOSIS — D472 Monoclonal gammopathy: Secondary | ICD-10-CM

## 2019-07-17 LAB — URIC ACID: Uric Acid, Serum: 6.4 mg/dL (ref 2.5–7.1)

## 2019-07-17 LAB — CMP (CANCER CENTER ONLY)
ALT: 23 U/L (ref 0–44)
AST: 16 U/L (ref 15–41)
Albumin: 3.9 g/dL (ref 3.5–5.0)
Alkaline Phosphatase: 80 U/L (ref 38–126)
Anion gap: 10 (ref 5–15)
BUN: 14 mg/dL (ref 8–23)
CO2: 26 mmol/L (ref 22–32)
Calcium: 9 mg/dL (ref 8.9–10.3)
Chloride: 104 mmol/L (ref 98–111)
Creatinine: 1.23 mg/dL — ABNORMAL HIGH (ref 0.44–1.00)
GFR, Est AFR Am: 51 mL/min — ABNORMAL LOW (ref >60)
GFR, Estimated: 44 mL/min — ABNORMAL LOW (ref >60)
Glucose, Bld: 97 mg/dL (ref 70–99)
Potassium: 3.5 mmol/L (ref 3.5–5.1)
Sodium: 140 mmol/L (ref 135–145)
Total Bilirubin: 0.4 mg/dL (ref 0.3–1.2)
Total Protein: 7.4 g/dL (ref 6.5–8.1)

## 2019-07-17 LAB — LACTATE DEHYDROGENASE: LDH: 234 U/L — ABNORMAL HIGH (ref 98–192)

## 2019-07-17 LAB — CBC WITH DIFFERENTIAL/PLATELET
Abs Immature Granulocytes: 0.01 10*3/uL (ref 0.00–0.07)
Basophils Absolute: 0.1 10*3/uL (ref 0.0–0.1)
Basophils Relative: 1 %
Eosinophils Absolute: 0.1 10*3/uL (ref 0.0–0.5)
Eosinophils Relative: 1 %
HCT: 41.8 % (ref 36.0–46.0)
Hemoglobin: 13.7 g/dL (ref 12.0–15.0)
Immature Granulocytes: 0 %
Lymphocytes Relative: 12 %
Lymphs Abs: 1 10*3/uL (ref 0.7–4.0)
MCH: 29.5 pg (ref 26.0–34.0)
MCHC: 32.8 g/dL (ref 30.0–36.0)
MCV: 89.9 fL (ref 80.0–100.0)
Monocytes Absolute: 0.7 10*3/uL (ref 0.1–1.0)
Monocytes Relative: 8 %
Neutro Abs: 6.9 10*3/uL (ref 1.7–7.7)
Neutrophils Relative %: 78 %
Platelets: 329 10*3/uL (ref 150–400)
RBC: 4.65 MIL/uL (ref 3.87–5.11)
RDW: 13.1 % (ref 11.5–15.5)
WBC: 8.7 10*3/uL (ref 4.0–10.5)
nRBC: 0 % (ref 0.0–0.2)

## 2019-07-17 LAB — SAVE SMEAR(SSMR), FOR PROVIDER SLIDE REVIEW

## 2019-07-18 LAB — BETA 2 MICROGLOBULIN, SERUM: Beta-2 Microglobulin: 1.9 mg/L (ref 0.6–2.4)

## 2019-07-24 NOTE — Progress Notes (Signed)
Kimberly Waters  [phone] [phone]     ID: BLONDINA CODERRE DOB: [birth year]  MR#: [id number]  KCM#:[id number]  Patient Care Team: Josetta Huddle, MD as PCP - General (Internal Medicine) Jerline Pain, MD as PCP - Cardiology (Cardiology) Juanita Craver, MD as Consulting Physician (Gastroenterology) Markella Dao, Virgie Dad, MD as Consulting Physician (Oncology) Excell Seltzer, MD as Consulting Physician (General Surgery) Gery Pray, MD as Consulting Physician (Radiation Oncology) Delice Bison, Charlestine Massed, NP as Nurse Practitioner (Hematology and Oncology) Kathrynn Ducking, MD as Consulting Physician (Neurology) Star Age, MD as Attending Physician (Neurology) OTHER MD:   CHIEF COMPLAINT: Estrogen receptor positive breast cancer  CURRENT TREATMENT: [Anastrozole]   INTERVAL HISTORY: [name] returns today for follow-up and treatment of her estrogen receptor positive breast cancer, as well as a referral from her neurologist to speak with someone about some abnormal blood work.  She continues on anastrozole. She has some minor hot flashes and minor arthralgias but no other problems that she is aware of  [name] does not have a bone density screening on file.   At the last visit she was complaining of significant peripheral nNeuropathy involving the legs.  She was evaluated by neurology.  She underwent a brain MRI on 07/07/2019 showing: T2/flair hyperintense foci within the pons and hemispheres consistent with chronic microvascular ischemic change.  None of the foci appear to be acute. Brain volume is normal for age.  Mild mastoid effusion likely due to eustachian tube dysfunction. There are no acute findings.  She also had a NCV on 06/18/2019 showing: Nerve conduction studies done on both lower extremities shows evidence of a mild to moderate peripheral neuropathy with mixed demyelinating and axonal features.  EMG evaluation of the right lower extremity shows  chronic stable neuropathic signs of denervation distally consistent with the diagnosis of peripheral neuropathy.  No evidence of an overlying lumbosacral radiculopathy was seen.  Dr. Rexene Alberts obtain an extensive panel of studies which showed among other things and M spike of 0.3, IgG lambda.  The patient was referred back for hematologic evaluation as appropriate   REVIEW OF SYSTEMS: Illene notes that she has fallen due to her neuropathy.  She uses Rollator to prevent this.  She does not have any pain, only numbness. She has had eczema, but no other rash.  She denies fevers.  She is fatigued despite wanting to do things. The patient denies unusual headaches, visual changes, nausea, vomiting, or dizziness. There has been no unusual cough, phlegm production, or pleurisy. This been no change in bowel or bladder habits. The patient denies unexplained weight loss, bleeding, or fever. A detailed review of systems was otherwise noncontributory.     BREAST CANCER HISTORY: From the original intake note:  [name] had an unremarkable mammogram 07/01/2012, after which she did not obtain further mammography. Sometime late 2017 she noted the right nipple was inverted. She brought this to medical attention and on 01/25/2017 she underwent bilateral mammography with tomography and right breast ultrasonography at [facility]. The breast density was category C. In the central right breast there was an area of architectural distortion associated with nipple retraction. Ultrasound confirmed a 2.2 cm irregular mass in the right breast central to the nipple. In addition there was a 2.5 cm irregular mass in the right breast superiorly. The right axilla was sonographically benign.  On 01/29/2017 she underwent biopsy of both these masses, and both showed morphologically similar invasive ductal carcinomas, grade 1 or 2, with evidence of lymphovascular invasion.  Both tumors were 100% estrogen receptor positive with strong staining intensity, and  60-100% progesterone receptor positive, with strong staining intensity. The MIP-1 varied from 5-10%. Both tumors were HER-2 negative, with a signals ratio 1.12-1.39, and the number per cell 2.30-3.97.  The patient's subsequent history is as detailed below   PAST MEDICAL HISTORY: Past Medical History:  Diagnosis Date   Allergy    Breast cancer ([place])    Colon polyps    Depression    Dyspnea    SEASONAL   Family history of breast cancer    Family history of colon cancer    GERD (gastroesophageal reflux disease)    History of radiation therapy 06/05/17-07/20/17   right chest wall 50.4 Gy in 28 fractions, right axillary nodal region 45 Gy in 25 fractions   Hyperlipidemia    Hypertension    Hypothyroidism    Peripheral neuropathy 06/18/2019   PONV (postoperative nausea and vomiting)     PAST SURGICAL HISTORY: Past Surgical History:  Procedure Laterality Date   ABDOMINAL HYSTERECTOMY     KNEE SURGERY Right    MASTECTOMY W/ SENTINEL NODE BIOPSY Right 03/05/2017   Procedure: BILATERAL TOTAL MASTECTOMIES WITH RIGHT SENTINEL LYMPH NODE BIOPSIES;  Surgeon: Excell Seltzer, MD;  Location: [facility] OR;  Service: General;  Laterality: Right;   SHOULDER SURGERY     BILAT    FAMILY HISTORY Family History  Problem Relation Age of Onset   Heart disease Mother 33       stents   Colon cancer Maternal Grandfather 59   Colon cancer Maternal Aunt 65   Colon cancer Maternal Uncle        dx in his 62s   Breast cancer Paternal Aunt 39   Breast cancer Cousin 3       paternal first cousin   Lung cancer Paternal Grandfather    Colon cancer Maternal Uncle    Head & neck cancer Maternal Uncle        oral cancer   The patient's father died at age 19. Her mother died at age 49 from "old age." The patient had no brothers, 1 sister. On the mother's side a grandfather, and an uncle all had colon cancer's. On the father's side there is an aunt and a cousin with breast cancer,  both diagnosed in their late 4s. There is no history of ovarian cancer in the family.   GYNECOLOGIC HISTORY:  No LMP recorded. Patient has had a hysterectomy.  menarche age 42, first live birth age 29, the patient is [place] P2. She underwent total abdominal hysterectomy with bilateral salpingo-oophorectomy remotely and took hormone replacement for approximately 6 years.    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 Her husband [name] ("[address] [place] is retired from [employer]. Their son [name] is an Chief Financial Officer living in [place]. Their daughter [name] works at [employer] in [place] the patient has 5 grandchildren. She is a Tourist information centre manager     ADVANCED DIRECTIVES:  not in place    HEALTH MAINTENANCE: Social History   Tobacco Use   Smoking status: Former Smoker    Packs/day: 1.00    Years: 45.00    Pack years: 45.00    Types: Cigarettes    Quit date: 02/22/2017    Years since quitting: 2.4   Smokeless tobacco: Never Used  Substance Use Topics   Alcohol use: No    Alcohol/week: 0.0 standard drinks  Drug use: No     Colonoscopy: 03/18/2014/[facility]  PAP: Status post hysterectomy  Bone density: Never    Allergies  Allergen Reactions   Adhesive [Tape] Other (See Comments)    (skin tears)  Tolerates PAPER TAPE   Codeine Nausea Only   Other Nausea Only    Anesthesia--nausea     Current Outpatient Medications  Medication Sig Dispense Refill   albuterol (PROVENTIL,VENTOLIN) 90 MCG/ACT inhaler Inhale 2 puffs into the lungs every 4 (four) hours as needed. (Patient taking differently: Inhale 2 puffs into the lungs every 4 (four) hours as needed for wheezing or shortness of breath. ) 17 g 3   anastrozole (ARIMIDEX) 1 MG tablet Take 1 tablet (1 mg total) by mouth daily. 90 tablet 4   aspirin EC 81 MG EC tablet Take 1 tablet (81 mg total) by mouth daily.     budesonide-formoterol (SYMBICORT)  80-4.5 MCG/ACT inhaler Inhale 2 puffs into the lungs 2 (two) times daily. 1 Inhaler 11   buPROPion (WELLBUTRIN SR) 150 MG 12 hr tablet      diphenhydrAMINE (BENADRYL) 25 MG tablet Take 25 mg by mouth as needed for sleep.     escitalopram (LEXAPRO) 10 MG tablet Take 1 tablet (10 mg total) by mouth daily. 30 tablet 0   furosemide (LASIX) 40 MG tablet Take 1 tablet (40 mg total) by mouth daily. 30 tablet 0   hydrOXYzine (ATARAX/VISTARIL) 25 MG tablet Take 25 mg by mouth at bedtime.     imipramine (TOFRANIL) 50 MG tablet Take 50 mg by mouth at bedtime.     ipratropium (ATROVENT) 0.06 % nasal spray Place 2 sprays into the nose 4 (four) times daily. 15 mL 3   levothyroxine (SYNTHROID, LEVOTHROID) 112 MCG tablet TAKE ONE TABLET BY MOUTH ONCE DAILY (Patient taking differently: Take 112 mcg by mouth daily before breakfast. ) 30 tablet 0   lisinopril (PRINIVIL,ZESTRIL) 2.5 MG tablet Take 1 tablet (2.5 mg total) by mouth daily. 30 tablet 0   omeprazole (PRILOSEC) 20 MG capsule Take 20 mg by mouth daily.     potassium chloride SA (K-DUR,KLOR-CON) 20 MEQ tablet Take 1 tablet (20 mEq total) by mouth daily. 30 tablet 0   simvastatin (ZOCOR) 20 MG tablet Take 1 tablet (20 mg total) by mouth at bedtime. 90 tablet 0   Tiotropium Bromide Monohydrate (SPIRIVA RESPIMAT) 2.5 MCG/ACT AERS Inhale 2 puffs into the lungs daily. 1 Inhaler 11   No current facility-administered medications for this visit.     OBJECTIVE: Middle-aged white woman using a Rollator  Vitals:   07/25/19 1458  BP: (!) 126/92  Pulse: 99  Resp: 18  Temp: 97.7 F (36.5 C)  SpO2: 95%   Body mass index is 34.29 kg/m.    ECOG FS:1 - Symptomatic but completely ambulatory   Sclerae unicteric, EOMs intact Wearing a mask; significant hearing defect No cervical or supraclavicular adenopathy Lungs no rales or rhonchi Heart regular rate and rhythm Abd soft, nontender, positive bowel sounds MSK no focal spinal tenderness, no upper  extremity lymphedema Neuro: nonfocal, well oriented, appropriate affect Breasts: Deferred  LAB RESULTS:  CMP     Component Value Date/Time   NA 140 07/17/2019 1417   NA 141 03/03/2019 1542   NA 137 10/16/2017 1059   K 3.5 07/17/2019 1417   K 4.1 10/16/2017 1059   CL 104 07/17/2019 1417   CO2 26 07/17/2019 1417   CO2 25 10/16/2017 1059   GLUCOSE 97 07/17/2019 1417   GLUCOSE  113 10/16/2017 1059   BUN 14 07/17/2019 1417   BUN 13 03/03/2019 1542   BUN 20.3 10/16/2017 1059   CREATININE 1.23 (H) 07/17/2019 1417   CREATININE 1.2 (H) 10/16/2017 1059   CALCIUM 9.0 07/17/2019 1417   CALCIUM 9.3 10/16/2017 1059   PROT 7.4 07/17/2019 1417   PROT 7.2 03/03/2019 1542   PROT 7.2 10/16/2017 1059   ALBUMIN 3.9 07/17/2019 1417   ALBUMIN 4.7 03/03/2019 1542   ALBUMIN 3.5 10/16/2017 1059   AST 16 07/17/2019 1417   AST 17 10/16/2017 1059   ALT 23 07/17/2019 1417   ALT 25 10/16/2017 1059   ALKPHOS 80 07/17/2019 1417   ALKPHOS 48 10/16/2017 1059   BILITOT 0.4 07/17/2019 1417   BILITOT 0.40 10/16/2017 1059   GFRNONAA 44 (L) 07/17/2019 1417   GFRAA 51 (L) 07/17/2019 1417    INo results found for: SPEP, UPEP  Lab Results  Component Value Date   WBC 8.7 07/17/2019   NEUTROABS 6.9 07/17/2019   HGB 13.7 07/17/2019   HCT 41.8 07/17/2019   MCV 89.9 07/17/2019   PLT 329 07/17/2019      Chemistry      Component Value Date/Time   NA 140 07/17/2019 1417   NA 141 03/03/2019 1542   NA 137 10/16/2017 1059   K 3.5 07/17/2019 1417   K 4.1 10/16/2017 1059   CL 104 07/17/2019 1417   CO2 26 07/17/2019 1417   CO2 25 10/16/2017 1059   BUN 14 07/17/2019 1417   BUN 13 03/03/2019 1542   BUN 20.3 10/16/2017 1059   CREATININE 1.23 (H) 07/17/2019 1417   CREATININE 1.2 (H) 10/16/2017 1059      Component Value Date/Time   CALCIUM 9.0 07/17/2019 1417   CALCIUM 9.3 10/16/2017 1059   ALKPHOS 80 07/17/2019 1417   ALKPHOS 48 10/16/2017 1059   AST 16 07/17/2019 1417   AST 17 10/16/2017 1059    ALT 23 07/17/2019 1417   ALT 25 10/16/2017 1059   BILITOT 0.4 07/17/2019 1417   BILITOT 0.40 10/16/2017 1059       No results found for: LABCA2  No components found for: LABCA125  No results for input(s): INR in the last 168 hours.  Urinalysis No results found for: COLORURINE, APPEARANCEUR, LABSPEC, PHURINE, GLUCOSEU, HGBUR, BILIRUBINUR, KETONESUR, PROTEINUR, UROBILINOGEN, NITRITE, LEUKOCYTESUR   STUDIES: Mr Brain Wo Contrast  Result Date: 07/07/2019  [facility] [address] [place], [place] [address] [phone] NEUROIMAGING REPORT STUDY DATE: 07/07/2019 PATIENT NAME: KEIOSHA CANCRO DOB: 07-22-1946 MRN: [id number] EXAM: MRI Brain without contrast ORDERING CLINICIAN: Star Age, MD, PhD CLINICAL HISTORY: 73 year old woman with gait disturbance and foot numbness COMPARISON FILMS: None TECHNIQUE: MRI of the brain without contrast was obtained utilizing 5 mm axial slices with T1, T2, T2 flair, SWI and diffusion weighted views.  T1 sagittal and T2 coronal views were obtained. CONTRAST: none IMAGING SITE: [facility], [place], [place] FINDINGS: On sagittal images, the spinal cord is imaged caudally to C3 and is normal in caliber.   The contents of the posterior fossa are of normal size and position.   The pituitary gland and optic chiasm appear normal.    There is mild generalized cortical atrophy, within normal limits for age.  There are no abnormal extra-axial collections of fluid.  In the hemispheres, there are some scattered T2/flair hyperintense foci in the subcortical and deep white matter.  T2/flair hyperintense foci also noted in the pons.  The cerebellum  appears normal.   The deep gray matter appears normal. Diffusion weighted images are normal.  Susceptibility weighted images are normal.  The orbits appear normal.   The VIIth/VIIIth nerve complex appears normal.  There is a mild right and minimal left mastoid effusion.  The paranasal sinuses  appear normal.  Flow voids are identified within the major intracerebral arteries.   The left vertebral artery is dominant.   This MRI of the brain without contrast shows the following: 1.   T2/flair hyperintense foci within the pons and hemispheres consistent with chronic microvascular ischemic change.  None of the foci appear to be acute. 2.   Brain volume is normal for age. 3.   Mild mastoid effusion likely due to eustachian tube dysfunction.  4.   There are no acute findings. INTERPRETING PHYSICIAN: Richard A. Felecia Shelling, MD, PhD, FAAN Certified in  Neuroimaging by [employer]    ELIGIBLE FOR AVAILABLE RESEARCH PROTOCOL: no  ASSESSMENT: 73 y.o.[place]  Woman status post biopsy of 2 separate masses in the central right breast 01/29/2017, showing a clinical mT2 N0 invasive ductal carcinoma, stage IB in the new classification: Both masses being estrogen receptor and progesterone receptor positive, HER-2 not amplified, with an MIB-1 between 5 and 10%.  (1) status post bilateral mastectomies 03/05/2017 showing  (a) on the left, no malignancy  (b) On the right, a pT3 pN0, stage IIA invasive ductal carcinoma, grade 2,  with negative margins    (c) patient is not interested in reconstruction  (2) Oncotype score of 6 predicts a 10 year risk of recurrence outside the breast of 5% if the patient's only systemic therapy is tamoxifen for 5 years. It also predicts no benefit from chemotherapy.  (3) adjuvant radiation 06/05/17 - 07/20/17 Right Chest Wall/ 50.4 Gy in 28 fractions Right axillaryNodal region// 45 Gy in 25 fractions Mastectomy scar boost 10 gray in 5 fractions   (4) started anastrozole May 2018--held 07/26/2019 for evaluation of neuropathy  (a) bone density at [facility] 04/24/2017 showed a T score of -0.3  (b) repeat bone density to be scheduled October 2020  (5) genetics testing through the Hereditary Gene Panel offered by [facility] found no deleterious mutations in  APC, ATM, AXIN2, BARD1, BMPR1A, BRCA1, BRCA2, BRIP1, CDH1, CDKN2A (p14ARF), CDKN2A (p16INK4a), CHEK2, DICER1, EPCAM (Deletion/duplication testing only), GREM1 (promoter region deletion/duplication testing only), KIT, MEN1, MLH1, MSH2, MSH6, MUTYH, NBN, NF1, PALB2, PDGFRA, PMS2, POLD1, POLE, PTEN, RAD50, RAD51C, RAD51D, SDHB, SDHC, SDHD, SMAD4, SMARCA4. STK11, TP53, TSC1, TSC2, and VHL.  The following gene was evaluated for sequence changes only: SDHA and HOXB13 c.251G>A variant only  (a) 2 VUS were identified:  MSH6 c.3394G>C and TSC1 c.1481C>T  (6) tobacco abuse: Patient quit smoking 03/03/2017  (7) M-GUS: Multiple myeloma panel obtained 03/03/2019 shows an M protein of 0.3, IgG lambda  (8) neuropathy: Labs 03/03/2019 show a hemoglobin A1c of 6.3 (prediabetes).  PLAN: Giuliana is now a little over 2 years out from definitive surgery for breast cancer with no evidence of disease recurrence.  This is very favorable.  She has tolerated anastrozole well and the general plan has been to continue antiestrogens for a total of 5 years.  She now has peripheral neuropathy symptoms documented by neurology, and involving both lower extremities.  The question is why.  Anastrozole of course causes arthralgias and myalgias but there is only one case report that I can locate of it causing peripheral neuropathy.  Nevertheless we can certainly stop this medication  for a few months and see if it makes any difference.  The more likely cause I think is her prediabetes.  We discussed diet and exercise and she is interested in referral to physical therapy.  This was placed as well as a nutrition referral  She does have a very small IgG lambda monoclonal protein.  We discussed am-GUS.  She understands this is a benign condition which would not explain her neuropathy, and requires no treatment.  It does require follow-up and we will obtain the appropriate labs and on every 24-monthbasis for 1-2 years, then yearly.  There is  a low but steady risk of progression to myeloma  We are also going to obtain an MRI of her lumbar spine to make sure we are not dealing with pathology in that area  She was cautioned regarding fall precautions and commended regarding her pandemic precautions  She will see me again in 3 months.  She knows to call for any other issues that may develop before that visit.  I spent approximately 30 minutes face to face with Tamyka with more than 50% of that time spent in counseling and coordination of care.   GVirgie Dad Brodrick Curran, MD  07/25/19 3:59 PM Medical Oncology and Hematology [facility]Re[facility] LLC[address] Bald [address] StreetALivingston [place] [id number]. [phone]   Fax. [phone] I, AJacqualyn Poseyam acting as a sEducation administratorfor GChauncey Cruel MD.   I, GLurline DelMD, have reviewed the above documentation for accuracy and completeness, and I agree with the above.

## 2019-07-25 ENCOUNTER — Other Ambulatory Visit: Payer: Self-pay

## 2019-07-25 ENCOUNTER — Inpatient Hospital Stay (HOSPITAL_BASED_OUTPATIENT_CLINIC_OR_DEPARTMENT_OTHER): Payer: Medicare Other | Admitting: Oncology

## 2019-07-25 VITALS — BP 126/92 | HR 99 | Temp 97.7°F | Resp 18 | Ht 62.0 in | Wt 187.5 lb

## 2019-07-25 DIAGNOSIS — G629 Polyneuropathy, unspecified: Secondary | ICD-10-CM | POA: Diagnosis not present

## 2019-07-25 DIAGNOSIS — Z801 Family history of malignant neoplasm of trachea, bronchus and lung: Secondary | ICD-10-CM

## 2019-07-25 DIAGNOSIS — Z7982 Long term (current) use of aspirin: Secondary | ICD-10-CM

## 2019-07-25 DIAGNOSIS — F329 Major depressive disorder, single episode, unspecified: Secondary | ICD-10-CM

## 2019-07-25 DIAGNOSIS — C50411 Malignant neoplasm of upper-outer quadrant of right female breast: Secondary | ICD-10-CM | POA: Diagnosis not present

## 2019-07-25 DIAGNOSIS — Z86010 Personal history of colonic polyps: Secondary | ICD-10-CM

## 2019-07-25 DIAGNOSIS — E039 Hypothyroidism, unspecified: Secondary | ICD-10-CM | POA: Diagnosis not present

## 2019-07-25 DIAGNOSIS — Z800 Family history of malignant neoplasm of digestive organs: Secondary | ICD-10-CM

## 2019-07-25 DIAGNOSIS — R7303 Prediabetes: Secondary | ICD-10-CM

## 2019-07-25 DIAGNOSIS — D472 Monoclonal gammopathy: Secondary | ICD-10-CM

## 2019-07-25 DIAGNOSIS — Z9011 Acquired absence of right breast and nipple: Secondary | ICD-10-CM

## 2019-07-25 DIAGNOSIS — Z79811 Long term (current) use of aromatase inhibitors: Secondary | ICD-10-CM

## 2019-07-25 DIAGNOSIS — Z90710 Acquired absence of both cervix and uterus: Secondary | ICD-10-CM

## 2019-07-25 DIAGNOSIS — Z170 Estrogen receptor positive status [ER+]: Secondary | ICD-10-CM

## 2019-07-25 DIAGNOSIS — I10 Essential (primary) hypertension: Secondary | ICD-10-CM | POA: Diagnosis not present

## 2019-07-25 DIAGNOSIS — Z79899 Other long term (current) drug therapy: Secondary | ICD-10-CM

## 2019-07-25 DIAGNOSIS — M797 Fibromyalgia: Secondary | ICD-10-CM | POA: Diagnosis not present

## 2019-07-25 DIAGNOSIS — Z90722 Acquired absence of ovaries, bilateral: Secondary | ICD-10-CM

## 2019-07-25 DIAGNOSIS — E8881 Metabolic syndrome: Secondary | ICD-10-CM | POA: Insufficient documentation

## 2019-07-25 DIAGNOSIS — E785 Hyperlipidemia, unspecified: Secondary | ICD-10-CM

## 2019-07-25 DIAGNOSIS — Z803 Family history of malignant neoplasm of breast: Secondary | ICD-10-CM

## 2019-07-25 DIAGNOSIS — Z9013 Acquired absence of bilateral breasts and nipples: Secondary | ICD-10-CM

## 2019-07-25 DIAGNOSIS — R232 Flushing: Secondary | ICD-10-CM | POA: Diagnosis not present

## 2019-07-25 DIAGNOSIS — Z87891 Personal history of nicotine dependence: Secondary | ICD-10-CM

## 2019-07-29 ENCOUNTER — Telehealth: Payer: Self-pay | Admitting: Oncology

## 2019-07-29 NOTE — Telephone Encounter (Signed)
I talk with patient regarding schedule and she did not want nutrition appointment scheduled

## 2019-08-04 ENCOUNTER — Other Ambulatory Visit: Payer: Self-pay

## 2019-08-04 ENCOUNTER — Ambulatory Visit (HOSPITAL_COMMUNITY)
Admission: RE | Admit: 2019-08-04 | Discharge: 2019-08-04 | Disposition: A | Discharge status: Home / Self Care | Payer: Medicare Other | Source: Ambulatory Visit | Attending: Oncology | Admitting: Oncology

## 2019-08-04 DIAGNOSIS — M5127 Other intervertebral disc displacement, lumbosacral region: Secondary | ICD-10-CM | POA: Diagnosis not present

## 2019-08-04 DIAGNOSIS — C50411 Malignant neoplasm of upper-outer quadrant of right female breast: Secondary | ICD-10-CM | POA: Insufficient documentation

## 2019-08-04 DIAGNOSIS — Z170 Estrogen receptor positive status [ER+]: Secondary | ICD-10-CM | POA: Diagnosis not present

## 2019-08-04 DIAGNOSIS — R7303 Prediabetes: Secondary | ICD-10-CM | POA: Insufficient documentation

## 2019-08-04 DIAGNOSIS — G629 Polyneuropathy, unspecified: Secondary | ICD-10-CM | POA: Diagnosis not present

## 2019-08-04 DIAGNOSIS — M4807 Spinal stenosis, lumbosacral region: Secondary | ICD-10-CM | POA: Diagnosis not present

## 2019-08-04 DIAGNOSIS — E8881 Metabolic syndrome: Secondary | ICD-10-CM | POA: Insufficient documentation

## 2019-08-04 MED ORDER — GADOBUTROL 1 MMOL/ML IV SOLN
9.0000 mL | Freq: Once | INTRAVENOUS | Status: AC | PRN
  Administered 2019-08-04: 9 mL via INTRAVENOUS

## 2019-08-05 ENCOUNTER — Other Ambulatory Visit: Payer: Self-pay | Admitting: Oncology

## 2019-08-05 NOTE — Progress Notes (Signed)
I called Kimberly Waters and let her know that all we are seeing in her MRI is degenerative disc disease.

## 2019-08-07 ENCOUNTER — Ambulatory Visit: Payer: Medicare Other | Attending: Oncology | Admitting: Physical Therapy

## 2019-08-07 ENCOUNTER — Other Ambulatory Visit: Payer: Self-pay

## 2019-08-07 DIAGNOSIS — M6281 Muscle weakness (generalized): Secondary | ICD-10-CM | POA: Insufficient documentation

## 2019-08-07 DIAGNOSIS — R209 Unspecified disturbances of skin sensation: Secondary | ICD-10-CM | POA: Insufficient documentation

## 2019-08-07 DIAGNOSIS — R262 Difficulty in walking, not elsewhere classified: Secondary | ICD-10-CM | POA: Insufficient documentation

## 2019-08-07 DIAGNOSIS — R296 Repeated falls: Secondary | ICD-10-CM | POA: Diagnosis not present

## 2019-08-07 NOTE — Therapy (Signed)
[place], [place], [address] Phone: [phone]   Fax:  [phone]  Physical Therapy Treatment  Patient Details  Name: Kimberly Waters MRN: [id number] Date of Birth: [birth year] Referring Provider (PT): Dr. Jana Hakim    Encounter Date: 08/07/2019  PT End of Session - 08/07/19 1613    Visit Number  1    Number of Visits  9    Date for PT Re-Evaluation  09/08/19    PT Start Time  1194    PT Stop Time  1315    PT Time Calculation (min)  40 min    Activity Tolerance  Patient tolerated treatment well    Behavior During Therapy  [facility] for tasks assessed/performed       Past Medical History:  Diagnosis Date  . Allergy   . Breast cancer ([place])   . Colon polyps   . Depression   . Dyspnea    SEASONAL  . Family history of breast cancer   . Family history of colon cancer   . GERD (gastroesophageal reflux disease)   . History of radiation therapy 06/05/17-07/20/17   right chest wall 50.4 Gy in 28 fractions, right axillary nodal region 45 Gy in 25 fractions  . Hyperlipidemia   . Hypertension   . Hypothyroidism   . Peripheral neuropathy 06/18/2019  . PONV (postoperative nausea and vomiting)     Past Surgical History:  Procedure Laterality Date  . ABDOMINAL HYSTERECTOMY    . KNEE SURGERY Right   . MASTECTOMY W/ SENTINEL NODE BIOPSY Right 03/05/2017   Procedure: BILATERAL TOTAL MASTECTOMIES WITH RIGHT SENTINEL LYMPH NODE BIOPSIES;  Surgeon: Excell Seltzer, MD;  Location: [facility];  Service: General;  Laterality: Right;  . SHOULDER SURGERY     BILAT    There were no vitals filed for this visit.  Subjective Assessment - 08/07/19 1232    Subjective  From my knees down it feels like it belongs to someone else. "Its wierd" my feet feel like they are very thick "my balance is awful"  she says that when she is walking sometimes she goes to the left and sometimes to the right.  She does not know why she has the  numbness in her feet  She says she has been to see many doctors.    Pertinent History  Patient was diagnosed 01/25/17 with right grade 1-2 invasive ductal carcinoma breast cancer. There are 2 masses both located in the upper outer quadrant measuring 2.2 cm and 2.5 cm. They are both ER/PR positive and HER2 negative with a Ki67 of 5-10%. She did not have chemotherapy but did have radiation. She had bilateral mastecomty with "all of her lymph nodes removed" from the right side.  The numbness started about 6 months ago. Past history includes asthsma, CHF  and excema. Peripheral neuropathy is from unknown cause    Patient Stated Goals  to legs stronger and be able to go up and down the steps easier    Currently in Pain?  No/denies         [facility] PT Assessment - 08/07/19 0001      Assessment   Medical Diagnosis  peripheral neuropathy     Referring Provider (PT)  Dr. Jana Hakim     Onset Date/Surgical Date  03/05/17    Hand Dominance  Right    Prior Therapy  none      Precautions   Precautions  Fall    Precaution Comments  --  Restrictions   Weight Bearing Restrictions  No      Balance Screen   Has the patient fallen in the past 6 months  Yes    How many times?  --   pt states she has fallen many times.    Has the patient had a decrease in activity level because of a fear of falling?   No    Is the patient reluctant to leave their home because of a fear of falling?   No   she is does not like to go up and down stairs to enter home      [place] residence    Living Arrangements  Spouse/significant other   Husband   Available Help at Discharge  Family      Prior Function   Level of [place]  Retired    Biomedical scientist  mom passed away in 03/02/18 and that required alot of care pt thinks she may be still grieving     Leisure  She does not exercise      Cognition   Overall Cognitive Status  Within Functional Limits  for tasks assessed      Observation/Other Assessments   Observations  Pt comes in using a 4 wheeled walker that she says she uses when out of doors. pt holds onto furniture or walls at home  She moves quickly and impulsively with walker. well healed bilateral masatectomy on chest with right side very tight and immobile scar with visible fullness above incision line.     Skin Integrity  dark areas on both forears that she says her excema flares up.  she has an open area with bleeding on left foream  and on right leg from where she had scratched open a place       Sensation   Light Touch  Impaired by gross assessment    Additional Comments  pt reports numbness in both feet, her right may be a little worse than left       Coordination   Gross Motor Movements are Fluid and Coordinated  No   pt impulsive, lack control of movment     Functional Tests   Functional tests  Sit to Stand      Sit to Stand   Comments  6 reps in 30 seconds with arms across the chest       Posture/Postural Control   Posture/Postural Control  Postural limitations    Postural Limitations  Rounded Shoulders;Forward head    Posture Comments  obesity       ROM / Strength   AROM / PROM / Strength  AROM;Strength      AROM   Overall AROM Comments  pt has limitation in the end ranges of shoulder ROM,R>L but it is [facility] for [facility] and she does not think it needs to be addressed other ROM WFL     AROM Assessment Site  Shoulder;Hip;Knee;Ankle    Right Shoulder Extension  --    Right Shoulder Flexion  --    Right Shoulder ABduction  --    Right Shoulder Internal Rotation  --    Right Shoulder External Rotation  --    Left Shoulder Extension  --    Left Shoulder Flexion  --    Left Shoulder ABduction  --    Left Shoulder Internal Rotation  --    Left Shoulder External Rotation  --  Cervical Flexion  --    Cervical Extension  --    Cervical - Right Side Bend  --    Cervical - Left Side Bend  --    Cervical - Right  Rotation  --    Cervical - Left Rotation  --      Strength   Overall Strength  Within functional limits for tasks performed    Overall Strength Comments  Isometric testing is not fully accurate as pt shows different muscle stretngth in different positions     Strength Assessment Site  Hand    Right Hand Grip (lbs)  38/40/40    Left Hand Grip (lbs)  20/23/20    Right/Left Hip  Right;Left    Right Hip Flexion  3/5    Right Hip ABduction  3/5   measured in sidelying    Left Hip Flexion  4/5    Left Hip ABduction  3+/5   measured in sidelying    Right Ankle Dorsiflexion  4-/5    Right Ankle Plantar Flexion  4+/5    Right Ankle Inversion  --    Left Ankle Dorsiflexion  4+/5    Left Ankle Plantar Flexion  4+/5      Palpation   Palpation comment  --      Bed Mobility   Bed Mobility  Rolling Right;Rolling Left;Supine to Sit;Sit to Sidelying Right   tends to move quickly, with decreased attention to safety    Rolling Right  Independent    Rolling Left  Independent    Supine to Sit  Independent      Ambulation/Gait   Ambulation/Gait  Yes    Ambulation/Gait Assistance  4: Min assist    Ambulation/Gait Assistance Details  hand hold assist and pt brushing fingers along wall with other hand.  Pt has wide base, left foot out in external rotation, decreased heel strike on both sides, pt tends to move fast to get to chair to sit and has some shortness of breath.     Ambulation Distance (Feet)  50 Feet      Standardized Balance Assessment   Standardized Balance Assessment  Timed Up and Go Test      Timed Up and Go Test   TUG  Normal TUG    Normal TUG (seconds)  11.36   with rollator    TUG Comments  15.22 with hand hold assist                                 PT Long Term Goals - 08/07/19 1622      PT LONG TERM GOAL #1   Title  pt will increase repeated sit to stand to 9 times in 30 seconds indicating an improvment in functional strength    Baseline  6 times  in 30 seconds on 08/07/2019    Time  4    Period  Weeks    Status  New      PT LONG TERM GOAL #2   Title  Pt will decrease normal TUG to < 10 seconds with rollator indicating an improvment in balance    Baseline  11.36 sed on 08/07/2019    Time  4    Period  Weeks    Status  New      PT LONG TERM GOAL #3   Title  Pt will report she is able to go up and down the steps  in her home easier by 50%    Time  4    Period  Weeks    Status  New      PT LONG TERM GOAL #4   Title  Pt will be independent in a home exercise program for LE stregnth and balance    Time  4    Period  Weeks    Status  New            Plan - 08/07/19 1614    Clinical Impression Statement  Pt with past history of breast cancer with bilateral mastectomy and radiation  but no chemotherapy comes to PT with gait and mobility impairment from peripheral neuropathy of unknown cause. She has had problem with falls. She is limited in sit to stand and in walking without an assistive device and wants to get stronger so that she can go up and down stairs easier.    Personal Factors and Comorbidities  Comorbidity 3+    Comorbidities  history of cancer, CHF, prediabetes, excema    Examination-Activity Limitations  Locomotion Level;Stairs;Squat    Stability/Clinical Decision Making  Stable/Uncomplicated    Clinical Decision Making  Low    Clinical Impairments Affecting Rehab Potential  unknown cause of peripheral neuropathy    PT Frequency  2x / week    PT Duration  4 weeks    PT Treatment/Interventions  ADLs/Self Care Home Management;DME Instruction;Gait training;Stair training;Balance training;Therapeutic exercise;Therapeutic activities;Functional mobility training;Neuromuscular re-education;Patient/family education    PT Next Visit Plan  strengthening to hip  flexors, abductors and extensors, standing balance and dynamic gait activities, bike or nustep progress to HEP    Consulted and Agree with Plan of Care  Patient        Patient will benefit from skilled therapeutic intervention in order to improve the following deficits and impairments:  Abnormal gait, Decreased balance, Decreased endurance, Decreased mobility, Decreased skin integrity, Difficulty walking, Postural dysfunction, Decreased strength, Decreased activity tolerance, Decreased coordination  Visit Diagnosis: 1. Difficulty in walking, not elsewhere classified   2. Unspecified disturbances of skin sensation   3. Muscle weakness (generalized)   4. Repeated falls        Problem List Patient Active Problem List   Diagnosis Date Noted  . Pre-diabetes 07/25/2019  . Metabolic syndrome 45/36/4680  . Neuropathy 07/25/2019  . MGUS (monoclonal gammopathy of unknown significance) 06/26/2019  . Peripheral neuropathy 06/18/2019  . Diastolic CHF ([place]) 32/11/2481  . Acute exacerbation of CHF (congestive heart failure) ([place]) 04/01/2018  . Dyspnea on exertion 03/28/2018  . Genetic testing 03/14/2017  . Colon polyps   . Family history of colon cancer   . Family history of breast cancer   . Malignant neoplasm of upper-outer quadrant of right breast in female, estrogen receptor positive ([place]) 02/06/2017  . Hypotension 12/31/2012  . Asthma exacerbation 12/30/2012  . Dehydration 12/30/2012  . Viral gastroenteritis 12/30/2012  . Vitamin D deficiency 04/16/2012  . Asthma 04/24/2007  . HYPOTHYROIDISM, UNSPECIFIED 02/21/2007  . HYPERTRIGLYCERIDEMIA 02/21/2007  . Hyperlipidemia 02/21/2007  . DEPRESSION, MAJOR, RECURRENT 02/21/2007  . TOBACCO DEPENDENCE 02/21/2007  . HYPERTENSION, BENIGN SYSTEMIC 02/21/2007  . SINUSITIS, CHRONIC, NOS 02/21/2007  . RHINITIS, ALLERGIC 02/21/2007  . DIVERTICULOSIS OF COLON 02/21/2007  . HEARTBURN 02/21/2007  . INCONTINENCE, URGE 02/21/2007   Donato Heinz. Owens Shark PT  Norwood Levo 08/07/2019, 4:25 PM  [place], [place], [address] Phone:  [phone]   Fax:  [phone]  Name:  Kimberly Waters MRN: [id number] Date of Birth: 1946-05-30

## 2019-08-11 ENCOUNTER — Ambulatory Visit: Payer: Medicare Other

## 2019-08-11 ENCOUNTER — Other Ambulatory Visit: Payer: Self-pay

## 2019-08-11 DIAGNOSIS — M6281 Muscle weakness (generalized): Secondary | ICD-10-CM

## 2019-08-11 DIAGNOSIS — R296 Repeated falls: Secondary | ICD-10-CM

## 2019-08-11 DIAGNOSIS — R209 Unspecified disturbances of skin sensation: Secondary | ICD-10-CM

## 2019-08-11 DIAGNOSIS — R262 Difficulty in walking, not elsewhere classified: Secondary | ICD-10-CM

## 2019-08-11 NOTE — Patient Instructions (Signed)
Heel Raise: Bilateral (Standing)   Cancer Rehab [id number]    Stand near counter for fingertip support if needed. Rise on balls of feet. Repeat __10-20__ times per set. Do _1-2___ sets per session. Do __2__ sessions per day.  SINGLE LIMB STANCE    Stand in corner with husband in front of you placing your fingertips on his. Raise leg. Hold _10-20__ seconds. Repeat with other leg. Once this becomes easier, for increased challenge, close eyes with fingertips on counter for safety. _3-5__ reps per set, _2-3__ sets per day.   Tandem Stance    Stand in corner with your husband in front of you placing your fingertips on his. Right foot in front of left, heel touching toe both feet "straight ahead". Stand on Foot Triangle of Support with both feet. Balance in this position _10-20__ seconds. Do with left foot in front of right. Once this becomes easier, for increased challenge, close eyes with fingertips on counter for safety.  Step-Up: Forward    Move step-stool to counter or hold rail if at steps, but as little as able. Step up forward keeping opposite foot off step. Keep pelvis level and back straight. Hold the single leg stand for 3 seconds, then come back down slowly.  Do _10__ times, do _1-2_ sets, on each leg, _1-2__ times per day.   HIP: Flexion Standing    Holding onto counter lift leg straight in front keeping knee straight. Slow and controlled! _10__ reps per set, _2-3__ sets per day. Then repeat with other leg.   Hip Extension (Standing)    Stand with support at counter. Squeeze pelvic floor and hold so as not to twist hips and don't lean forward.  Move right leg backward with straight knee. Slow and controlled! Repeat _10__ times. Do _2-3__ times a day. Repeat with other leg.  Hip Abduction (Standing)    Stand with support. Squeeze pelvic floor and hold. Lift right leg out to side, keeping toe forward.  Repeat _10__ times. Do _2-3__ times a day. Repeat with other  leg.

## 2019-08-11 NOTE — Therapy (Signed)
[place], [place], [address] Phone: [phone]   Fax:  [phone]  Physical Therapy Treatment  Patient Details  Name: Kimberly Waters MRN: [id number] Date of Birth: 02-10-1946 Referring Provider (PT): Dr. Jana Hakim    Encounter Date: 08/11/2019  PT End of Session - 08/11/19 1303    Visit Number  2    Number of Visits  9    Date for PT Re-Evaluation  09/08/19    PT Start Time  1232    PT Stop Time  1316    PT Time Calculation (min)  44 min    Activity Tolerance  Patient tolerated treatment well    Behavior During Therapy  [facility] for tasks assessed/performed       Past Medical History:  Diagnosis Date  . Allergy   . Breast cancer ([place])   . Colon polyps   . Depression   . Dyspnea    SEASONAL  . Family history of breast cancer   . Family history of colon cancer   . GERD (gastroesophageal reflux disease)   . History of radiation therapy 06/05/17-07/20/17   right chest wall 50.4 Gy in 28 fractions, right axillary nodal region 45 Gy in 25 fractions  . Hyperlipidemia   . Hypertension   . Hypothyroidism   . Peripheral neuropathy 06/18/2019  . PONV (postoperative nausea and vomiting)     Past Surgical History:  Procedure Laterality Date  . ABDOMINAL HYSTERECTOMY    . KNEE SURGERY Right   . MASTECTOMY W/ SENTINEL NODE BIOPSY Right 03/05/2017   Procedure: BILATERAL TOTAL MASTECTOMIES WITH RIGHT SENTINEL LYMPH NODE BIOPSIES;  Surgeon: Excell Seltzer, MD;  Location: [facility];  Service: General;  Laterality: Right;  . SHOULDER SURGERY     BILAT    There were no vitals filed for this visit.  Subjective Assessment - 08/11/19 1237    Subjective  I just keep falling, want to get stronger so I can stop doing that.    Pertinent History  Patient was diagnosed 01/25/17 with right grade 1-2 invasive ductal carcinoma breast cancer. There are 2 masses both located in the upper outer quadrant measuring 2.2 cm and  2.5 cm. They are both ER/PR positive and HER2 negative with a Ki67 of 5-10%. She did not have chemotherapy but did have radiation. She had bilateral mastecomty with "all of her lymph nodes removed" from the right side.  The numbness started about 6 months ago. Past history includes asthsma, CHF  and excema. Peripheral neuropathy is from unknown cause    Patient Stated Goals  to legs stronger and be able to go up and down the steps easier    Currently in Pain?  No/denies                       [facility] Adult PT Treatment/Exercise - 08/11/19 0001      Neuro Re-ed    Neuro Re-ed Details   In // bars: Slow and controlled high knee marching (multiple VCs and demo to keep fingertips on bars and for technique), toe walking, heel -toe walking with multiple VCs to keep fingertips on bars; crossover in front, then backwards toe walking all 2 times each and all with multiple VCs throughout to keep fingertips on bars (she kept trying no UE support but was with much increased LOB with this and incorrect technique)      Knee/Hip Exercises: Aerobic   Recumbent Bike  tried this  but pt reports too challenging    Nustep  Level 3, x5 mins, pt reports tired after but able to cont conversing throughout without SOB.      Knee/Hip Exercises: Standing   Hip Flexion  Stengthening;Right;Left;10 reps   on Airex in // bars   Hip Flexion Limitations  multiple VCs for slower pace and to keep knees straight; also to not lift leg so high that she leans trunk forward    Hip Abduction  Stengthening;Right;Left;10 reps   on Airex in // bars   Hip Extension  Stengthening;Right;Left;10 reps   on Airex in // bars   Extension Limitations  Multiple VCs to keep knees straight and decrease forward trunk lean    Forward Step Up  Right;Left;10 reps;Hand Hold: 2;Step Height: 4"   in // bars            PT Education - 08/11/19 1320    Education Details  Balance and bil hip 3 way strength    Person(s) Educated   Patient    Methods  Explanation;Demonstration;Handout    Comprehension  Verbalized understanding;Returned demonstration;Need further instruction;Verbal cues required          PT Long Term Goals - 08/07/19 1622      PT LONG TERM GOAL #1   Title  pt will increase repeated sit to stand to 9 times in 30 seconds indicating an improvment in functional strength    Baseline  6 times in 30 seconds on 08/07/2019    Time  4    Period  Weeks    Status  New      PT LONG TERM GOAL #2   Title  Pt will decrease normal TUG to < 10 seconds with rollator indicating an improvment in balance    Baseline  11.36 sed on 08/07/2019    Time  4    Period  Weeks    Status  New      PT LONG TERM GOAL #3   Title  Pt will report she is able to go up and down the steps in her home easier by 50%    Time  4    Period  Weeks    Status  New      PT LONG TERM GOAL #4   Title  Pt will be independent in a home exercise program for LE stregnth and balance    Time  4    Period  Weeks    Status  New            Plan - 08/11/19 1306    Clinical Impression Statement  Pt did well but was very challenged by all activities today. Required multiple seated rest breaks during therapy today and to keep fingertips on // bars as she was trying no UE support but this increased her LOB. After encouraging her to try just fingertips she was able to perform activities with better technique. IssuedHEP today but instructed pt to have her husband near by and/or helping her with fingertip support as she is not ready to do these independently at this time. She reports understanding this and seemed encouraged by this session overall and what she was able to achieve.    Personal Factors and Comorbidities  Comorbidity 3+    Comorbidities  history of cancer, CHF, prediabetes, excema    Examination-Activity Limitations  Locomotion Level;Stairs;Squat    Stability/Clinical Decision Making  Stable/Uncomplicated    Clinical Impairments  Affecting Rehab Potential  unknown cause of  peripheral neuropathy    PT Frequency  2x / week    PT Duration  4 weeks    PT Treatment/Interventions  ADLs/Self Care Home Management;DME Instruction;Gait training;Stair training;Balance training;Therapeutic exercise;Therapeutic activities;Functional mobility training;Neuromuscular re-education;Patient/family education    PT Next Visit Plan  Cont strengthening to hip flexors, abductors and extensors, standing balance and dynamic gait activities, nustep (bike too challenging right now);  review and progress (prn) HEP    PT Home Exercise Plan  Standing balance and hip 3 way strength    Consulted and Agree with Plan of Care  Patient       Patient will benefit from skilled therapeutic intervention in order to improve the following deficits and impairments:  Abnormal gait, Decreased balance, Decreased endurance, Decreased mobility, Decreased skin integrity, Difficulty walking, Postural dysfunction, Decreased strength, Decreased activity tolerance, Decreased coordination  Visit Diagnosis: 1. Difficulty in walking, not elsewhere classified   2. Unspecified disturbances of skin sensation   3. Muscle weakness (generalized)   4. Repeated falls        Problem List Patient Active Problem List   Diagnosis Date Noted  . Pre-diabetes 07/25/2019  . Metabolic syndrome 65/78/4696  . Neuropathy 07/25/2019  . MGUS (monoclonal gammopathy of unknown significance) 06/26/2019  . Peripheral neuropathy 06/18/2019  . Diastolic CHF ([place]) 29/52/8413  . Acute exacerbation of CHF (congestive heart failure) ([place]) 04/01/2018  . Dyspnea on exertion 03/28/2018  . Genetic testing 03/14/2017  . Colon polyps   . Family history of colon cancer   . Family history of breast cancer   . Malignant neoplasm of upper-outer quadrant of right breast in female, estrogen receptor positive ([place]) 02/06/2017  . Hypotension 12/31/2012  . Asthma exacerbation 12/30/2012  . Dehydration  12/30/2012  . Viral gastroenteritis 12/30/2012  . Vitamin D deficiency 04/16/2012  . Asthma 04/24/2007  . HYPOTHYROIDISM, UNSPECIFIED 02/21/2007  . HYPERTRIGLYCERIDEMIA 02/21/2007  . Hyperlipidemia 02/21/2007  . DEPRESSION, MAJOR, RECURRENT 02/21/2007  . TOBACCO DEPENDENCE 02/21/2007  . HYPERTENSION, BENIGN SYSTEMIC 02/21/2007  . SINUSITIS, CHRONIC, NOS 02/21/2007  . RHINITIS, ALLERGIC 02/21/2007  . DIVERTICULOSIS OF COLON 02/21/2007  . HEARTBURN 02/21/2007  . INCONTINENCE, URGE 02/21/2007    Otelia Limes, PTA 08/11/2019, 1:29 PM  [place], [place], [address] Phone: [phone]   Fax:  [phone]  Name: GLORIAJEAN OKUN MRN: [id number] Date of Birth: 1946-06-03

## 2019-08-14 ENCOUNTER — Ambulatory Visit: Payer: Medicare Other | Admitting: Physical Therapy

## 2019-08-19 ENCOUNTER — Encounter: Payer: Self-pay | Admitting: Physical Therapy

## 2019-08-19 ENCOUNTER — Ambulatory Visit: Payer: Medicare Other | Admitting: Physical Therapy

## 2019-08-19 ENCOUNTER — Other Ambulatory Visit: Payer: Self-pay

## 2019-08-19 DIAGNOSIS — R296 Repeated falls: Secondary | ICD-10-CM

## 2019-08-19 DIAGNOSIS — R209 Unspecified disturbances of skin sensation: Secondary | ICD-10-CM | POA: Diagnosis not present

## 2019-08-19 DIAGNOSIS — R262 Difficulty in walking, not elsewhere classified: Secondary | ICD-10-CM | POA: Diagnosis not present

## 2019-08-19 DIAGNOSIS — M6281 Muscle weakness (generalized): Secondary | ICD-10-CM | POA: Diagnosis not present

## 2019-08-19 NOTE — Therapy (Signed)
[place], [place], [address] Phone: [phone]   Fax:  [phone]  Physical Therapy Treatment  Patient Details  Name: Kimberly Waters MRN: [id number] Date of Birth: 09-12-1946 Referring Provider (PT): Dr. Jana Hakim    Encounter Date: 08/19/2019  PT End of Session - 08/19/19 1828    Visit Number  3    Number of Visits  9    Date for PT Re-Evaluation  09/08/19    PT Start Time  [id number]    PT Stop Time  1612    PT Time Calculation (min)  42 min    Activity Tolerance  Patient tolerated treatment well    Behavior During Therapy  [facility] for tasks assessed/performed       Past Medical History:  Diagnosis Date  . Allergy   . Breast cancer ([place])   . Colon polyps   . Depression   . Dyspnea    SEASONAL  . Family history of breast cancer   . Family history of colon cancer   . GERD (gastroesophageal reflux disease)   . History of radiation therapy 06/05/17-07/20/17   right chest wall 50.4 Gy in 28 fractions, right axillary nodal region 45 Gy in 25 fractions  . Hyperlipidemia   . Hypertension   . Hypothyroidism   . Peripheral neuropathy 06/18/2019  . PONV (postoperative nausea and vomiting)     Past Surgical History:  Procedure Laterality Date  . ABDOMINAL HYSTERECTOMY    . KNEE SURGERY Right   . MASTECTOMY W/ SENTINEL NODE BIOPSY Right 03/05/2017   Procedure: BILATERAL TOTAL MASTECTOMIES WITH RIGHT SENTINEL LYMPH NODE BIOPSIES;  Surgeon: Excell Seltzer, MD;  Location: [facility];  Service: General;  Laterality: Right;  . SHOULDER SURGERY     BILAT    There were no vitals filed for this visit.  Subjective Assessment - 08/19/19 1530    Subjective  Pt says she feels alot better today. She is able to go up and down the steps now with no problem.  She is not sure what happenend    Currently in Pain?  No/denies                       [facility] Adult PT Treatment/Exercise - 08/19/19 0001      Ambulation/Gait   Ambulation/Gait  Yes    Gait Comments  raised rollator handles so that pt could stand more upright in gait and tried several options to move to lesser device: 2 dowel rods, one straight cane with one hand on wall, pulling rollator so she walked forward without obstruction, but pt did not feel comfortable with doing any of these.  She prefers to walk with rollator.       Neuro Re-ed    Neuro Re-ed Details   pt stood in corner for security and stablitiy should she lose her balance and played catch and throw with yellow ball and raised it overhead all with no balance loss       Exercises   Exercises  Shoulder;Knee/Hip      Knee/Hip Exercises: Standing   Hip Abduction  Stengthening;Right;Left;10 reps   standing at counter    Hip Extension  Stengthening;Right;Left;10 reps    Extension Limitations  standing at counter       Knee/Hip Exercises: Supine   Bridges  Right;Left;10 reps      Knee/Hip Exercises: Sidelying   Hip ABduction  Strengthening;Right;Left;10 reps      Shoulder  Exercises: Seated   Other Seated Exercises  3# weight for 5 repetitions of biceps curls and forward shoulder raises to 90 with each arm       Shoulder Exercises: Standing   Flexion  AROM;Right;Left;10 reps    Flexion Limitations  standing, raised arms up without holding on                   PT Long Term Goals - 08/07/19 1622      PT LONG TERM GOAL #1   Title  pt will increase repeated sit to stand to 9 times in 30 seconds indicating an improvment in functional strength    Baseline  6 times in 30 seconds on 08/07/2019    Time  4    Period  Weeks    Status  New      PT LONG TERM GOAL #2   Title  Pt will decrease normal TUG to < 10 seconds with rollator indicating an improvment in balance    Baseline  11.36 sed on 08/07/2019    Time  4    Period  Weeks    Status  New      PT LONG TERM GOAL #3   Title  Pt will report she is able to go up and down the steps in her home easier by  50%    Time  4    Period  Weeks    Status  New      PT LONG TERM GOAL #4   Title  Pt will be independent in a home exercise program for LE stregnth and balance    Time  4    Period  Weeks    Status  New            Plan - 08/19/19 1829    Clinical Impression Statement  Pt is much improved with gait today.  She did have to have 3 rest breaks during session, but did not have balance loss.  Tried several ways to move toward lesser assistive device but pt does not feel comfortable with anything other than the rollator.    Comorbidities  history of cancer, CHF, prediabetes, excema    Examination-Activity Limitations  Locomotion Level;Stairs;Squat    Stability/Clinical Decision Making  Stable/Uncomplicated    Clinical Impairments Affecting Rehab Potential  unknown cause of peripheral neuropathy    PT Frequency  2x / week    PT Duration  4 weeks    PT Treatment/Interventions  ADLs/Self Care Home Management;DME Instruction;Gait training;Stair training;Balance training;Therapeutic exercise;Therapeutic activities;Functional mobility training;Neuromuscular re-education;Patient/family education    PT Next Visit Plan  Teach and issue written home program Cont strengthening to hip flexors, abductors and extensors, standing balance and dynamic gait activities, nustep (bike too challenging right now);  review and progress (prn) HEP    Consulted and Agree with Plan of Care  Patient       Patient will benefit from skilled therapeutic intervention in order to improve the following deficits and impairments:     Visit Diagnosis: Difficulty in walking, not elsewhere classified  Unspecified disturbances of skin sensation  Muscle weakness (generalized)  Repeated falls     Problem List Patient Active Problem List   Diagnosis Date Noted  . Pre-diabetes 07/25/2019  . Metabolic syndrome [id number]  . Neuropathy 07/25/2019  . MGUS (monoclonal gammopathy of unknown significance) 06/26/2019  .  Peripheral neuropathy 06/18/2019  . Diastolic CHF ([place]) [id number]  . Acute exacerbation of CHF (congestive heart failure) ([place])  04/01/2018  . Dyspnea on exertion 03/28/2018  . Genetic testing 03/14/2017  . Colon polyps   . Family history of colon cancer   . Family history of breast cancer   . Malignant neoplasm of upper-outer quadrant of right breast in female, estrogen receptor positive ([place]) 02/06/2017  . Hypotension 12/31/2012  . Asthma exacerbation 12/30/2012  . Dehydration 12/30/2012  . Viral gastroenteritis 12/30/2012  . Vitamin D deficiency 04/16/2012  . Asthma 04/24/2007  . HYPOTHYROIDISM, UNSPECIFIED 02/21/2007  . HYPERTRIGLYCERIDEMIA 02/21/2007  . Hyperlipidemia 02/21/2007  . DEPRESSION, MAJOR, RECURRENT 02/21/2007  . TOBACCO DEPENDENCE 02/21/2007  . HYPERTENSION, BENIGN SYSTEMIC 02/21/2007  . SINUSITIS, CHRONIC, NOS 02/21/2007  . RHINITIS, ALLERGIC 02/21/2007  . DIVERTICULOSIS OF COLON 02/21/2007  . HEARTBURN 02/21/2007  . INCONTINENCE, URGE 02/21/2007   Donato Heinz. [facility] PT  Norwood Levo 08/19/2019, 6:32 PM  [place], [place], [address] Phone: [phone]   Fax:  [phone]  Name: Kimberly Waters MRN: [id number] Date of Birth: August 30, 1946

## 2019-08-21 ENCOUNTER — Other Ambulatory Visit: Payer: Self-pay

## 2019-08-21 ENCOUNTER — Ambulatory Visit: Payer: Medicare Other

## 2019-08-21 DIAGNOSIS — M6281 Muscle weakness (generalized): Secondary | ICD-10-CM | POA: Diagnosis not present

## 2019-08-21 DIAGNOSIS — R296 Repeated falls: Secondary | ICD-10-CM

## 2019-08-21 DIAGNOSIS — R262 Difficulty in walking, not elsewhere classified: Secondary | ICD-10-CM | POA: Diagnosis not present

## 2019-08-21 DIAGNOSIS — R209 Unspecified disturbances of skin sensation: Secondary | ICD-10-CM

## 2019-08-21 NOTE — Therapy (Addendum)
[place], [place], [address] Phone: [phone]   Fax:  [phone]  Physical Therapy Treatment  Patient Details  Name: Kimberly Waters MRN: [id number] Date of Birth: 05/03/1946 Referring Provider (PT): Dr. Jana Hakim    Encounter Date: 08/21/2019  PT End of Session - 08/21/19 1037    Visit Number  4    Number of Visits  9    Date for PT Re-Evaluation  09/08/19    PT Start Time  1001    PT Stop Time  1030   pt requested leaving early   PT Time Calculation (min)  29 min    Activity Tolerance  Patient tolerated treatment well    Behavior During Therapy  [facility] for tasks assessed/performed       Past Medical History:  Diagnosis Date  . Allergy   . Breast cancer ([place])   . Colon polyps   . Depression   . Dyspnea    SEASONAL  . Family history of breast cancer   . Family history of colon cancer   . GERD (gastroesophageal reflux disease)   . History of radiation therapy 06/05/17-07/20/17   right chest wall 50.4 Gy in 28 fractions, right axillary nodal region 45 Gy in 25 fractions  . Hyperlipidemia   . Hypertension   . Hypothyroidism   . Peripheral neuropathy 06/18/2019  . PONV (postoperative nausea and vomiting)     Past Surgical History:  Procedure Laterality Date  . ABDOMINAL HYSTERECTOMY    . KNEE SURGERY Right   . MASTECTOMY W/ SENTINEL NODE BIOPSY Right 03/05/2017   Procedure: BILATERAL TOTAL MASTECTOMIES WITH RIGHT SENTINEL LYMPH NODE BIOPSIES;  Surgeon: Excell Seltzer, MD;  Location: [facility];  Service: General;  Laterality: Right;  . SHOULDER SURGERY     BILAT    There were no vitals filed for this visit.  Subjective Assessment - 08/21/19 1006    Subjective  I am doing well now with going up and down my stairs. I want today to be my last visit bc I'm doing better and feel safer around my home now. I'm glad Dr. Jana Hakim sent me here!    Pertinent History  Patient was diagnosed 01/25/17 with  right grade 1-2 invasive ductal carcinoma breast cancer. There are 2 masses both located in the upper outer quadrant measuring 2.2 cm and 2.5 cm. They are both ER/PR positive and HER2 negative with a Ki67 of 5-10%. She did not have chemotherapy but did have radiation. She had bilateral mastecomty with "all of her lymph nodes removed" from the right side.  The numbness started about 6 months ago. Past history includes asthsma, CHF  and excema. Peripheral neuropathy is from unknown cause    Patient Stated Goals  to legs stronger and be able to go up and down the steps easier         [facility] PT Assessment - 08/21/19 0001      Sit to Stand   Comments  10 reps in 30 sec from mat table      Timed Up and Go Test   Normal TUG (seconds)  10.54   with rollator                  [facility] Adult PT Treatment/Exercise - 08/21/19 0001      Exercises   Exercises  Other Exercises    Other Exercises   Verbally reviewed pts HEP and she reports being compliant with these at home;  retested TUG and sit-stand in 30 sec for goal assess      Knee/Hip Exercises: Aerobic   Nustep  Level 5 x4:30 mins (stopped bc pt wanted too)                  PT Long Term Goals - 08/21/19 1013      PT LONG TERM GOAL #1   Title  pt will increase repeated sit to stand to 9 times in 30 seconds indicating an improvment in functional strength    Baseline  6 times in 30 seconds on 08/07/2019; 10 times in 30 second with no SOB after - 08/21/19    Status  Achieved      PT LONG TERM GOAL #2   Title  Pt will decrease normal TUG to < 10 seconds with rollator indicating an improvment in balance    Baseline  11.36 sed on 08/07/2019; 10.54 sec with rollator - 08/21/19    Status  Partially Met      PT LONG TERM GOAL #3   Title  Pt will report she is able to go up and down the steps in her home easier by 50%    Baseline  95% going up/down steps at home now and able to alternate pattern - 08/21/19    Status  Achieved       PT LONG TERM GOAL #4   Title  Pt will be independent in a home exercise program for LE stregnth and balance    Baseline  Pt is independent with HEP for balance - 08/21/19    Status  Achieved            Plan - 08/21/19 1037    Clinical Impression Statement  Pt comes in today reporting she feels much improved and would like to make today her last session. Also reported being constipated and wanted to end session early so she could get to pharmacy to get meds for this and get back home. Briefly reviewed with pt balance HEP issued by this therapist 2 sessions ago which pt reports compliance with and feels this has been very beneficial for her. She feels much more confident on the stairs, which was her biggest complaint/safety issue. Pt has done well with therapy having met all goals, except TUG but this was off by <1 sec. Pt is pleased with progress and would like to D/C today.    Personal Factors and Comorbidities  Comorbidity 3+    Comorbidities  history of cancer, CHF, prediabetes, excema    Examination-Activity Limitations  Locomotion Level;Stairs;Squat    Stability/Clinical Decision Making  Stable/Uncomplicated    Clinical Impairments Affecting Rehab Potential  unknown cause of peripheral neuropathy    PT Frequency  2x / week    PT Duration  4 weeks    PT Treatment/Interventions  ADLs/Self Care Home Management;DME Instruction;Gait training;Stair training;Balance training;Therapeutic exercise;Therapeutic activities;Functional mobility training;Neuromuscular re-education;Patient/family education    PT Next Visit Plan  D/C this visit.    PT Home Exercise Plan  Standing balance and hip 3 way strength    Consulted and Agree with Plan of Care  Patient       Patient will benefit from skilled therapeutic intervention in order to improve the following deficits and impairments:  Abnormal gait, Decreased balance, Decreased endurance, Decreased mobility, Decreased skin integrity, Difficulty  walking, Postural dysfunction, Decreased strength, Decreased activity tolerance, Decreased coordination  Visit Diagnosis: Difficulty in walking, not elsewhere classified  Unspecified disturbances of skin sensation  Muscle weakness (generalized)  Repeated falls     Problem List Patient Active Problem List   Diagnosis Date Noted  . Pre-diabetes 07/25/2019  . Metabolic syndrome 34/19/6222  . Neuropathy 07/25/2019  . MGUS (monoclonal gammopathy of unknown significance) 06/26/2019  . Peripheral neuropathy 06/18/2019  . Diastolic CHF ([place]) 97/98/9211  . Acute exacerbation of CHF (congestive heart failure) ([place]) 04/01/2018  . Dyspnea on exertion 03/28/2018  . Genetic testing 03/14/2017  . Colon polyps   . Family history of colon cancer   . Family history of breast cancer   . Malignant neoplasm of upper-outer quadrant of right breast in female, estrogen receptor positive ([place]) 02/06/2017  . Hypotension 12/31/2012  . Asthma exacerbation 12/30/2012  . Dehydration 12/30/2012  . Viral gastroenteritis 12/30/2012  . Vitamin D deficiency 04/16/2012  . Asthma 04/24/2007  . HYPOTHYROIDISM, UNSPECIFIED 02/21/2007  . HYPERTRIGLYCERIDEMIA 02/21/2007  . Hyperlipidemia 02/21/2007  . DEPRESSION, MAJOR, RECURRENT 02/21/2007  . TOBACCO DEPENDENCE 02/21/2007  . HYPERTENSION, BENIGN SYSTEMIC 02/21/2007  . SINUSITIS, CHRONIC, NOS 02/21/2007  . RHINITIS, ALLERGIC 02/21/2007  . DIVERTICULOSIS OF COLON 02/21/2007  . HEARTBURN 02/21/2007  . INCONTINENCE, URGE 02/21/2007   . Otelia Limes, PTA 08/21/2019, 10:43 AM   PHYSICAL THERAPY DISCHARGE SUMMARY  Visits from Start of Care: 4  Current functional level related to goals / functional outcomes: A above   Remaining deficits: As above    Education / Equipment: Home exercise  Plan: Patient agrees to discharge.  Patient goals were partially met. Patient is being discharged due to the patient's request.  ?????    Donato Heinz.  [facility], [place], [place], [address] Phone: [phone]   Fax:  [phone]  Name: AMILLION SCOBEE MRN: [id number] Date of Birth: August 05, 1946

## 2019-09-02 ENCOUNTER — Encounter: Payer: Medicare Other | Admitting: Physical Therapy

## 2019-09-04 ENCOUNTER — Encounter: Payer: Medicare Other | Admitting: Physical Therapy

## 2019-10-15 ENCOUNTER — Telehealth: Payer: Self-pay

## 2019-10-15 NOTE — Telephone Encounter (Signed)

## 2019-10-17 ENCOUNTER — Other Ambulatory Visit: Payer: Self-pay

## 2019-10-17 ENCOUNTER — Inpatient Hospital Stay: Payer: Medicare Other | Attending: Oncology

## 2019-10-17 DIAGNOSIS — D472 Monoclonal gammopathy: Secondary | ICD-10-CM | POA: Insufficient documentation

## 2019-10-17 DIAGNOSIS — Z23 Encounter for immunization: Secondary | ICD-10-CM | POA: Diagnosis not present

## 2019-10-17 DIAGNOSIS — Z79811 Long term (current) use of aromatase inhibitors: Secondary | ICD-10-CM | POA: Diagnosis not present

## 2019-10-17 DIAGNOSIS — Z9013 Acquired absence of bilateral breasts and nipples: Secondary | ICD-10-CM | POA: Diagnosis not present

## 2019-10-17 DIAGNOSIS — Z87891 Personal history of nicotine dependence: Secondary | ICD-10-CM | POA: Diagnosis not present

## 2019-10-17 DIAGNOSIS — I10 Essential (primary) hypertension: Secondary | ICD-10-CM | POA: Insufficient documentation

## 2019-10-17 DIAGNOSIS — Z170 Estrogen receptor positive status [ER+]: Secondary | ICD-10-CM | POA: Diagnosis not present

## 2019-10-17 DIAGNOSIS — R7303 Prediabetes: Secondary | ICD-10-CM | POA: Diagnosis not present

## 2019-10-17 DIAGNOSIS — Z7951 Long term (current) use of inhaled steroids: Secondary | ICD-10-CM | POA: Diagnosis not present

## 2019-10-17 DIAGNOSIS — Z7982 Long term (current) use of aspirin: Secondary | ICD-10-CM | POA: Insufficient documentation

## 2019-10-17 DIAGNOSIS — Z923 Personal history of irradiation: Secondary | ICD-10-CM | POA: Diagnosis not present

## 2019-10-17 DIAGNOSIS — Z803 Family history of malignant neoplasm of breast: Secondary | ICD-10-CM | POA: Insufficient documentation

## 2019-10-17 DIAGNOSIS — Z801 Family history of malignant neoplasm of trachea, bronchus and lung: Secondary | ICD-10-CM | POA: Diagnosis not present

## 2019-10-17 DIAGNOSIS — F329 Major depressive disorder, single episode, unspecified: Secondary | ICD-10-CM | POA: Diagnosis not present

## 2019-10-17 DIAGNOSIS — C50411 Malignant neoplasm of upper-outer quadrant of right female breast: Secondary | ICD-10-CM

## 2019-10-17 DIAGNOSIS — Z800 Family history of malignant neoplasm of digestive organs: Secondary | ICD-10-CM | POA: Insufficient documentation

## 2019-10-17 DIAGNOSIS — C50111 Malignant neoplasm of central portion of right female breast: Secondary | ICD-10-CM | POA: Insufficient documentation

## 2019-10-17 DIAGNOSIS — Z90710 Acquired absence of both cervix and uterus: Secondary | ICD-10-CM | POA: Insufficient documentation

## 2019-10-17 DIAGNOSIS — E8881 Metabolic syndrome: Secondary | ICD-10-CM

## 2019-10-17 DIAGNOSIS — Z79899 Other long term (current) drug therapy: Secondary | ICD-10-CM | POA: Diagnosis not present

## 2019-10-17 DIAGNOSIS — G629 Polyneuropathy, unspecified: Secondary | ICD-10-CM | POA: Diagnosis not present

## 2019-10-17 DIAGNOSIS — E039 Hypothyroidism, unspecified: Secondary | ICD-10-CM | POA: Insufficient documentation

## 2019-10-17 LAB — COMPREHENSIVE METABOLIC PANEL
ALT: 20 U/L (ref 0–44)
AST: 16 U/L (ref 15–41)
Albumin: 3.5 g/dL (ref 3.5–5.0)
Alkaline Phosphatase: 82 U/L (ref 38–126)
Anion gap: 11 (ref 5–15)
BUN: 14 mg/dL (ref 8–23)
CO2: 23 mmol/L (ref 22–32)
Calcium: 9.1 mg/dL (ref 8.9–10.3)
Chloride: 106 mmol/L (ref 98–111)
Creatinine, Ser: 1.18 mg/dL — ABNORMAL HIGH (ref 0.44–1.00)
GFR calc Af Amer: 54 mL/min — ABNORMAL LOW (ref >60)
GFR calc non Af Amer: 46 mL/min — ABNORMAL LOW (ref >60)
Glucose, Bld: 79 mg/dL (ref 70–99)
Potassium: 3.9 mmol/L (ref 3.5–5.1)
Sodium: 140 mmol/L (ref 135–145)
Total Bilirubin: 0.4 mg/dL (ref 0.3–1.2)
Total Protein: 7 g/dL (ref 6.5–8.1)

## 2019-10-17 LAB — CBC WITH DIFFERENTIAL/PLATELET
Abs Immature Granulocytes: 0.01 10*3/uL (ref 0.00–0.07)
Basophils Absolute: 0 10*3/uL (ref 0.0–0.1)
Basophils Relative: 1 %
Eosinophils Absolute: 0.2 10*3/uL (ref 0.0–0.5)
Eosinophils Relative: 3 %
HCT: 37.6 % (ref 36.0–46.0)
Hemoglobin: 12.3 g/dL (ref 12.0–15.0)
Immature Granulocytes: 0 %
Lymphocytes Relative: 16 %
Lymphs Abs: 1 10*3/uL (ref 0.7–4.0)
MCH: 28.9 pg (ref 26.0–34.0)
MCHC: 32.7 g/dL (ref 30.0–36.0)
MCV: 88.5 fL (ref 80.0–100.0)
Monocytes Absolute: 0.5 10*3/uL (ref 0.1–1.0)
Monocytes Relative: 8 %
Neutro Abs: 4.5 10*3/uL (ref 1.7–7.7)
Neutrophils Relative %: 72 %
Platelets: 302 10*3/uL (ref 150–400)
RBC: 4.25 MIL/uL (ref 3.87–5.11)
RDW: 13.2 % (ref 11.5–15.5)
WBC: 6.2 10*3/uL (ref 4.0–10.5)
nRBC: 0 % (ref 0.0–0.2)

## 2019-10-20 ENCOUNTER — Other Ambulatory Visit: Payer: Self-pay

## 2019-10-20 ENCOUNTER — Telehealth (INDEPENDENT_AMBULATORY_CARE_PROVIDER_SITE_OTHER): Payer: Medicare Other | Admitting: Cardiology

## 2019-10-20 VITALS — BP 136/81 | HR 96 | Ht 62.0 in | Wt 185.0 lb

## 2019-10-20 DIAGNOSIS — E782 Mixed hyperlipidemia: Secondary | ICD-10-CM | POA: Diagnosis not present

## 2019-10-20 DIAGNOSIS — I10 Essential (primary) hypertension: Secondary | ICD-10-CM | POA: Diagnosis not present

## 2019-10-20 DIAGNOSIS — J45909 Unspecified asthma, uncomplicated: Secondary | ICD-10-CM

## 2019-10-20 DIAGNOSIS — I5032 Chronic diastolic (congestive) heart failure: Secondary | ICD-10-CM

## 2019-10-20 LAB — MULTIPLE MYELOMA PANEL, SERUM
Albumin SerPl Elph-Mcnc: 3.8 g/dL (ref 2.9–4.4)
Albumin/Glob SerPl: 1.4 (ref 0.7–1.7)
Alpha 1: 0.2 g/dL (ref 0.0–0.4)
Alpha2 Glob SerPl Elph-Mcnc: 1.1 g/dL — ABNORMAL HIGH (ref 0.4–1.0)
B-Globulin SerPl Elph-Mcnc: 0.9 g/dL (ref 0.7–1.3)
Gamma Glob SerPl Elph-Mcnc: 0.6 g/dL (ref 0.4–1.8)
Globulin, Total: 2.8 g/dL (ref 2.2–3.9)
IgA: 118 mg/dL (ref 64–422)
IgG (Immunoglobin G), Serum: 692 mg/dL (ref 586–1602)
IgM (Immunoglobulin M), Srm: 214 mg/dL (ref 26–217)
M Protein SerPl Elph-Mcnc: 0.2 g/dL — ABNORMAL HIGH
Total Protein ELP: 6.6 g/dL (ref 6.0–8.5)

## 2019-10-20 LAB — KAPPA/LAMBDA LIGHT CHAINS
Kappa free light chain: 19.1 mg/L (ref 3.3–19.4)
Kappa, lambda light chain ratio: 0.82 (ref 0.26–1.65)
Lambda free light chains: 23.4 mg/L (ref 5.7–26.3)

## 2019-10-20 NOTE — Progress Notes (Signed)
Virtual Visit via Telephone Note   This visit type was conducted due to national recommendations for restrictions regarding the COVID-19 Pandemic (e.g. social distancing) in an effort to limit this patient's exposure and mitigate transmission in our community.  Due to her co-morbid illnesses, this patient is at least at moderate risk for complications without adequate follow up.  This format is felt to be most appropriate for this patient at this time.  The patient did not have access to video technology/had technical difficulties with video requiring transitioning to audio format only (telephone).  All issues noted in this document were discussed and addressed.  No physical exam could be performed with this format.  Please refer to the patient's chart for her  consent to telehealth for [facility].   Date:  10/20/2019   ID:  Kimberly Waters, DOB October 19, 1946, MRN [id number]  Patient Location: Home Provider Location: Home  PCP:  Josetta Huddle, MD  Cardiologist:  Candee Furbish, MD  Electrophysiologist:  None   Evaluation Performed:  Follow-Up Visit  Chief Complaint: 53-month follow-up, seen for Dr. Marlou Porch.   History of Present Illness:    Kimberly Waters is a 73 y.o. female with a history of chronic diastolic heart failure, hypertension, hyperlipidemia, persistent asthma, history of breast cancer s/p mastectomy and mild renal dysfunction.  She was previously evaluated for atypical chest pain in 2019 in which an echocardiogram performed 03/29/2018 showed an LVEF of 55 to 60%, G1 DD, mild MR, peak PA pressure 29 mmHg. Myoview stress test obtained 05/31/2018 was normal with inferior and apical thinning consistent with soft tissue attenuation however there was no ischemia or scar, overall low risk study.  She was last seen 07/11/2019 for 21-month follow-up and preoperative evaluation for colonoscopy by Almyra Deforest, PA.  She had no chest pain or other anginal symptoms and was doing well from a cardiac  perspective.  She was cleared for colonoscopy and was able to hold her ASA for 5 days if needed.  She was euvolemic on exam and BP was stable.  Today Kimberly Waters is doing well from a cardiac perspective.  Just returned from a 2-week beach trip vacation with her husband in their camper.  Continues to have mild exertional shortness of breath however this is been chronic and unchanged from previous visits.  Denies chest pain, palpitations, PND, orthopnea, dizziness or syncope.  Has some mild dependent edema however resolves quickly with her diuretic.  Monitors salt intake.  BP stable today at 136/81.  Heart rate mildly elevated however reports that she is currently doing laundry and running up and down her stairs.  Overall no complaints.  The patient does not have symptoms concerning for COVID-19 infection (fever, chills, cough, or new shortness of breath).   Past Medical History:  Diagnosis Date  . Allergy   . Breast cancer ([place])   . Colon polyps   . Depression   . Dyspnea    SEASONAL  . Family history of breast cancer   . Family history of colon cancer   . GERD (gastroesophageal reflux disease)   . History of radiation therapy 06/05/17-07/20/17   right chest wall 50.4 Gy in 28 fractions, right axillary nodal region 45 Gy in 25 fractions  . Hyperlipidemia   . Hypertension   . Hypothyroidism   . Peripheral neuropathy 06/18/2019  . PONV (postoperative nausea and vomiting)    Past Surgical History:  Procedure Laterality Date  . ABDOMINAL HYSTERECTOMY    . KNEE SURGERY  Right   . MASTECTOMY W/ SENTINEL NODE BIOPSY Right 03/05/2017   Procedure: BILATERAL TOTAL MASTECTOMIES WITH RIGHT SENTINEL LYMPH NODE BIOPSIES;  Surgeon: Excell Seltzer, MD;  Location: [place];  Service: General;  Laterality: Right;  . SHOULDER SURGERY     BILAT     Current Meds  Medication Sig  . albuterol (PROVENTIL,VENTOLIN) 90 MCG/ACT inhaler Inhale 2 puffs into the lungs every 4 (four) hours as needed. (Patient  taking differently: Inhale 2 puffs into the lungs every 4 (four) hours as needed for wheezing or shortness of breath. )  . aspirin EC 81 MG EC tablet Take 1 tablet (81 mg total) by mouth daily.  . budesonide-formoterol (SYMBICORT) 80-4.5 MCG/ACT inhaler Inhale 2 puffs into the lungs 2 (two) times daily.  Marland Kitchen buPROPion (WELLBUTRIN SR) 150 MG 12 hr tablet Take 150 mg by mouth daily.   . diphenhydrAMINE (BENADRYL) 25 MG tablet Take 25 mg by mouth as needed for sleep.  Marland Kitchen escitalopram (LEXAPRO) 10 MG tablet Take 1 tablet (10 mg total) by mouth daily.  . furosemide (LASIX) 40 MG tablet Take 1 tablet (40 mg total) by mouth daily.  . hydrOXYzine (ATARAX/VISTARIL) 25 MG tablet Take 25 mg by mouth at bedtime.  Marland Kitchen imipramine (TOFRANIL) 50 MG tablet Take 50 mg by mouth at bedtime.  Marland Kitchen ipratropium (ATROVENT) 0.06 % nasal spray Place 2 sprays into the nose 4 (four) times daily. (Patient taking differently: Place 2 sprays into the nose 4 (four) times daily as needed. )  . levothyroxine (SYNTHROID, LEVOTHROID) 112 MCG tablet TAKE ONE TABLET BY MOUTH ONCE DAILY (Patient taking differently: Take 112 mcg by mouth daily before breakfast. )  . lisinopril (PRINIVIL,ZESTRIL) 2.5 MG tablet Take 1 tablet (2.5 mg total) by mouth daily.  Marland Kitchen omeprazole (PRILOSEC) 20 MG capsule Take 20 mg by mouth daily.  . potassium chloride SA (K-DUR,KLOR-CON) 20 MEQ tablet Take 1 tablet (20 mEq total) by mouth daily.  . simvastatin (ZOCOR) 20 MG tablet Take 1 tablet (20 mg total) by mouth at bedtime.     Allergies:   Adhesive [tape], Codeine, and Other   Social History   Tobacco Use  . Smoking status: Former Smoker    Packs/day: 1.00    Years: 45.00    Pack years: 45.00    Types: Cigarettes    Quit date: 02/22/2017    Years since quitting: 2.6  . Smokeless tobacco: Never Used  Substance Use Topics  . Alcohol use: No    Alcohol/week: 0.0 standard drinks  . Drug use: No     Family Hx: The patient's family history includes Breast  cancer (age of onset: 62) in her cousin; Breast cancer (age of onset: 32) in her paternal aunt; Colon cancer in her maternal uncle and maternal uncle; Colon cancer (age of onset: 13) in her maternal grandfather; Colon cancer (age of onset: 90) in her maternal aunt; Head & neck cancer in her maternal uncle; Heart disease (age of onset: 20) in her mother; Lung cancer in her paternal grandfather.  ROS:   Please see the history of present illness.     All other systems reviewed and are negative.  Prior CV studies:   The following studies were reviewed today:  Echo 03/29/2018 LV EF: 55% - 60% Study Conclusions  - Left ventricle: The cavity size was normal. There was moderate concentric hypertrophy. Systolic function was normal. The estimated ejection fraction was in the range of 55% to 60%. Wall motion was normal; there were  no regional wall motion abnormalities. Doppler parameters are consistent with abnormal left ventricular relaxation (grade 1 diastolic dysfunction). - Aortic valve: There was no regurgitation. - Mitral valve: Transvalvular velocity was within the normal range. There was no evidence for stenosis. There was mild regurgitation. - Left atrium: The atrium was mildly dilated. - Right ventricle: The cavity size was normal. Wall thickness was normal. Systolic function was normal. - Tricuspid valve: There was trivial regurgitation. - Pulmonary arteries: Systolic pressure was within the normal range. PA peak pressure: 29 mm Hg (S).  Myoview 05/31/2018 Study Highlights   Nuclear stress EF: 59%.  Inferior and apical thnning consistent with prabable soft tissue attenuation and normal perfusion No significant ischemia or scar  Low risk   Labs/Other Tests and Data Reviewed:    EKG:  No ECG reviewed.  Recent Labs: 10/17/2019: ALT 20; BUN 14; Creatinine, Ser 1.18; Hemoglobin 12.3; Platelets 302; Potassium 3.9; Sodium 140   Recent Lipid Panel Lab Results   Component Value Date/Time   CHOL 188 12/09/2008 09:39 PM   TRIG 216 (H) 12/09/2008 09:39 PM   HDL 39 (L) 12/09/2008 09:39 PM   CHOLHDL 4.8 Ratio 12/09/2008 09:39 PM   LDLCALC 106 (H) 12/09/2008 09:39 PM   LDLDIRECT 120 (H) 02/12/2013 03:36 PM    Wt Readings from Last 3 Encounters:  10/20/19 185 lb (83.9 kg)  07/25/19 187 lb 8 oz (85 kg)  07/11/19 188 lb 9.6 oz (85.5 kg)    Objective:    Vital Signs:  BP 136/81   Pulse 96   Ht 5\' 2"  (1.575 m)   Wt 185 lb (83.9 kg)   BMI 33.84 kg/m    VITAL SIGNS:  reviewed GEN:  no acute distress NEURO:  alert and oriented x 3 PSYCH:  normal affect  ASSESSMENT & PLAN:    1.  Chronic diastolic heart failure: -Denies symptoms, stable -Continue continue Lasix 40 mg daily, lisinopril  2.  Hypertension: -Stable, 136/81 -Continue current regimen  3.  Hyperlipidemia: -Last LDL from 2009.  Reports PCP follows -We will have her send results to our office -Continue Zocor 20 mg daily  4.  CKD stage II: -Creatinine, 1.1 per oncology team -Baseline appears to be 1-1.2 range -Stable with no significant changes -Continue close monitoring with lisinopril as well as Lasix therapies   COVID-19 Education: The signs and symptoms of COVID-19 were discussed with the patient and how to seek care for testing (follow up with PCP or arrange E-visit). The importance of social distancing was discussed today.  Time:   Today, I have spent 20 minutes with the patient with telehealth technology discussing the above problems.     Medication Adjustments/Labs and Tests Ordered: Current medicines are reviewed at length with the patient today.  Concerns regarding medicines are outlined above.   Tests Ordered: No orders of the defined types were placed in this encounter.   Medication Changes: No orders of the defined types were placed in this encounter.   Follow Up:  Either In Person or Virtual Dr. Marlou Porch or myself in 6 months  Signed, Kathyrn Drown, NP  10/20/2019 1:09 PM    [place]

## 2019-10-20 NOTE — Patient Instructions (Signed)
Medication Instructions:  Your physician recommends that you continue on your current medications as directed. Please refer to the Current Medication list given to you today.  *If you need a refill on your cardiac medications before your next appointment, please call your pharmacy*  Lab Work: none If you have labs (blood work) drawn today and your tests are completely normal, you will receive your results only by: Marland Kitchen MyChart Message (if you have MyChart) OR . A paper copy in the mail If you have any lab test that is abnormal or we need to change your treatment, we will call you to review the results.  Testing/Procedures: none  Follow-Up: At [facility], you and your health needs are our priority.  As part of our continuing mission to provide you with exceptional heart care, we have created designated Provider Care Teams.  These Care Teams include your primary Cardiologist (physician) and Advanced Practice Providers (APPs -  Physician Assistants and Nurse Practitioners) who all work together to provide you with the care you need, when you need it.  Your next appointment:   6 months  The format for your next appointment:   Either In Person or Virtual  Provider:   You may see Candee Furbish, MD   or one of the following Advanced Practice Providers on your designated Care Team:    Truitt Merle, NP  Cecilie Kicks, NP  Kathyrn Drown, NP   Other Instructions

## 2019-10-21 NOTE — Progress Notes (Signed)
[place]  [phone] [phone]     ID: Kimberly Waters DOB: 1946/12/17  MR#: [id number]  WNI#:[id number]  Patient Care Team: Josetta Huddle, MD as PCP - General (Internal Medicine) Jerline Pain, MD as PCP - Cardiology (Cardiology) Juanita Craver, MD as Consulting Physician (Gastroenterology) Wally Shevchenko, Virgie Dad, MD as Consulting Physician (Oncology) Excell Seltzer, MD as Consulting Physician (General Surgery) Gery Pray, MD as Consulting Physician (Radiation Oncology) Delice Bison, Charlestine Massed, NP as Nurse Practitioner (Hematology and Oncology) Kathrynn Ducking, MD as Consulting Physician (Neurology) Star Age, MD as Attending Physician (Neurology) OTHER MD:  CHIEF COMPLAINT: Estrogen receptor positive breast cancer; status post bilateral mastectomies.  CURRENT TREATMENT: Anastrozole   INTERVAL HISTORY: Kimberly Waters returns today for follow-up and treatment of her estrogen receptor positive breast cancer.  She went off anastrozole because we were concerned it might in some way be related to her neuropathy.  Even though that seemed unlikely, it was worth a try.  However her being off it made no difference.  Her neuropathy is unchanged or worse.  She has been evaluated by a neurologist.  She is currently using a walker to avoid falls.  Accordingly since going off the anastrozole did not help she is ready to resume that medication.    Since her last visit, she underwent lumbar spine MRI on 08/04/2019. This showed only degenerative changes, no focal suspicious osseous lesion and no finding that would explain her symptoms.  REVIEW OF SYSTEMS: Suzetta has an RV and she and her husband go to [facility] for a week or 2 at a time and really have a good time eating out and being at [employer] with relatives.  As stated above she had a recent fall but has recovered nicely from that and really did not injure herself significantly.  She feels well except for the  neuropathy, feels lucky, and "I am doing just fine and I am happy".  She is not exercising regularly.  Detailed review of systems today was otherwise noncontributory    BREAST CANCER HISTORY: From the original intake note:  [name] had an unremarkable mammogram 07/01/2012, after which she did not obtain further mammography. Sometime late 2017 she noted the right nipple was inverted. She brought this to medical attention and on 01/25/2017 she underwent bilateral mammography with tomography and right breast ultrasonography at [facility]. The breast density was category C. In the central right breast there was an area of architectural distortion associated with nipple retraction. Ultrasound confirmed a 2.2 cm irregular mass in the right breast central to the nipple. In addition there was a 2.5 cm irregular mass in the right breast superiorly. The right axilla was sonographically benign.  On 01/29/2017 she underwent biopsy of both these masses, and both showed morphologically similar invasive ductal carcinomas, grade 1 or 2, with evidence of lymphovascular invasion. Both tumors were 100% estrogen receptor positive with strong staining intensity, and 60-100% progesterone receptor positive, with strong staining intensity. The MIP-1 varied from 5-10%. Both tumors were HER-2 negative, with a signals ratio 1.12-1.39, and the number per cell 2.30-3.97.  The patient's subsequent history is as detailed below   PAST MEDICAL HISTORY: Past Medical History:  Diagnosis Date   Allergy    Breast cancer (Kimberly Waters)    Colon polyps    Depression    Dyspnea    SEASONAL   Family history of breast cancer    Family history of colon cancer    GERD (gastroesophageal reflux disease)  History of radiation therapy 06/05/17-07/20/17   right chest wall 50.4 Gy in 28 fractions, right axillary nodal region 45 Gy in 25 fractions   Hyperlipidemia    Hypertension    Hypothyroidism    Peripheral neuropathy 06/18/2019    PONV (postoperative nausea and vomiting)     PAST SURGICAL HISTORY: Past Surgical History:  Procedure Laterality Date   ABDOMINAL HYSTERECTOMY     KNEE SURGERY Right    MASTECTOMY W/ SENTINEL NODE BIOPSY Right 03/05/2017   Procedure: BILATERAL TOTAL MASTECTOMIES WITH RIGHT SENTINEL LYMPH NODE BIOPSIES;  Surgeon: Excell Seltzer, MD;  Location: [facility] OR;  Service: General;  Laterality: Right;   SHOULDER SURGERY     BILAT    FAMILY HISTORY Family History  Problem Relation Age of Onset   Heart disease Mother 79       stents   Colon cancer Maternal Grandfather 59   Colon cancer Maternal Aunt 65   Colon cancer Maternal Uncle        dx in his 75s   Breast cancer Paternal Aunt 61   Breast cancer Cousin 53       paternal first cousin   Lung cancer Paternal Grandfather    Colon cancer Maternal Uncle    Head & neck cancer Maternal Uncle        oral cancer   The patient's father died at age 51. Her mother died at age 15 from "old age." The patient had no brothers, 1 sister. On the mother's side a grandfather, and an uncle all had colon cancer's. On the father's side there is an aunt and a cousin with breast cancer, both diagnosed in their late 51s. There is no history of ovarian cancer in the family.   GYNECOLOGIC HISTORY:  No LMP recorded. Patient has had a hysterectomy.  menarche age 74, first live birth age 34, the patient is [place] P2. She underwent total abdominal hysterectomy with bilateral salpingo-oophorectomy remotely and took hormone replacement for approximately 6 years.    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 Her husband [name] ("[address] Townley is retired from [employer]. Their son [name] is an Chief Financial Officer living in [place]. Their daughter [name] works at [employer] in [place]. The patient has 5 grandchildren. She is a Tourist information centre manager     ADVANCED DIRECTIVES:  not in place     HEALTH MAINTENANCE: Social History   Tobacco Use   Smoking status: Former Smoker    Packs/day: 1.00    Years: 45.00    Pack years: 45.00    Types: Cigarettes    Quit date: 02/22/2017    Years since quitting: 2.6   Smokeless tobacco: Never Used  Substance Use Topics   Alcohol use: No    Alcohol/week: 0.0 standard drinks   Drug use: No    Colonoscopy: 03/18/2014/[facility]  PAP: Status post hysterectomy  Bone density: Never    Allergies  Allergen Reactions   Adhesive [Tape] Other (See Comments)    (skin tears)  Tolerates PAPER TAPE   Codeine Nausea Only   Other Nausea Only    Anesthesia--nausea     Current Outpatient Medications  Medication Sig Dispense Refill   albuterol (PROVENTIL,VENTOLIN) 90 MCG/ACT inhaler Inhale 2 puffs into the lungs every 4 (four) hours as needed. (Patient taking differently: Inhale 2 puffs into the lungs every 4 (four) hours as needed for wheezing or shortness of breath. ) 17 g 3  aspirin EC 81 MG EC tablet Take 1 tablet (81 mg total) by mouth daily.     budesonide-formoterol (SYMBICORT) 80-4.5 MCG/ACT inhaler Inhale 2 puffs into the lungs 2 (two) times daily. 1 Inhaler 11   buPROPion (WELLBUTRIN SR) 150 MG 12 hr tablet Take 150 mg by mouth daily.      diphenhydrAMINE (BENADRYL) 25 MG tablet Take 25 mg by mouth as needed for sleep.     escitalopram (LEXAPRO) 10 MG tablet Take 1 tablet (10 mg total) by mouth daily. 30 tablet 0   furosemide (LASIX) 40 MG tablet Take 1 tablet (40 mg total) by mouth daily. 30 tablet 0   hydrOXYzine (ATARAX/VISTARIL) 25 MG tablet Take 25 mg by mouth at bedtime.     imipramine (TOFRANIL) 50 MG tablet Take 50 mg by mouth at bedtime.     ipratropium (ATROVENT) 0.06 % nasal spray Place 2 sprays into the nose 4 (four) times daily. (Patient taking differently: Place 2 sprays into the nose 4 (four) times daily as needed. ) 15 mL 3   levothyroxine (SYNTHROID, LEVOTHROID) 112 MCG tablet TAKE ONE TABLET BY MOUTH  ONCE DAILY (Patient taking differently: Take 112 mcg by mouth daily before breakfast. ) 30 tablet 0   lisinopril (PRINIVIL,ZESTRIL) 2.5 MG tablet Take 1 tablet (2.5 mg total) by mouth daily. 30 tablet 0   omeprazole (PRILOSEC) 20 MG capsule Take 20 mg by mouth daily.     potassium chloride SA (K-DUR,KLOR-CON) 20 MEQ tablet Take 1 tablet (20 mEq total) by mouth daily. 30 tablet 0   simvastatin (ZOCOR) 20 MG tablet Take 1 tablet (20 mg total) by mouth at bedtime. 90 tablet 0   Current Facility-Administered Medications  Medication Dose Route Frequency Provider Last Rate Last Dose   influenza vaccine adjuvanted (FLUAD) injection 0.5 mL  0.5 mL Intramuscular Once Axcel Horsch, Virgie Dad, MD        OBJECTIVE: Middle-aged white woman using a Rollator  Vitals:   10/22/19 1459  BP: 121/74  Pulse: 96  Resp: 18  Temp: 98.5 F (36.9 C)  SpO2: 98%   Body mass index is 34.28 kg/m.    ECOG FS:1 - Symptomatic but completely ambulatory   Sclerae unicteric, EOMs intact Wearing a mask No cervical or supraclavicular adenopathy Lungs no rales or rhonchi Heart regular rate and rhythm Abd soft, nontender, positive bowel sounds MSK no focal spinal tenderness, no upper extremity lymphedema Neuro: nonfocal, well oriented, appropriate affect Breasts: Tenderness post bilateral mastectomies.  There is no evidence of local recurrence.  Both axillae are benign.   LAB RESULTS:  CMP     Component Value Date/Time   NA 140 10/17/2019 1210   NA 141 03/03/2019 1542   NA 137 10/16/2017 1059   K 3.9 10/17/2019 1210   K 4.1 10/16/2017 1059   CL 106 10/17/2019 1210   CO2 23 10/17/2019 1210   CO2 25 10/16/2017 1059   GLUCOSE 79 10/17/2019 1210   GLUCOSE 113 10/16/2017 1059   BUN 14 10/17/2019 1210   BUN 13 03/03/2019 1542   BUN 20.3 10/16/2017 1059   CREATININE 1.18 (H) 10/17/2019 1210   CREATININE 1.23 (H) 07/17/2019 1417   CREATININE 1.2 (H) 10/16/2017 1059   CALCIUM 9.1 10/17/2019 1210   CALCIUM  9.3 10/16/2017 1059   PROT 7.0 10/17/2019 1210   PROT 7.2 03/03/2019 1542   PROT 7.2 10/16/2017 1059   ALBUMIN 3.5 10/17/2019 1210   ALBUMIN 4.7 03/03/2019 1542   ALBUMIN 3.5 10/16/2017 1059  AST 16 10/17/2019 1210   AST 16 07/17/2019 1417   AST 17 10/16/2017 1059   ALT 20 10/17/2019 1210   ALT 23 07/17/2019 1417   ALT 25 10/16/2017 1059   ALKPHOS 82 10/17/2019 1210   ALKPHOS 48 10/16/2017 1059   BILITOT 0.4 10/17/2019 1210   BILITOT 0.4 07/17/2019 1417   BILITOT 0.40 10/16/2017 1059   GFRNONAA 46 (L) 10/17/2019 1210   GFRNONAA 44 (L) 07/17/2019 1417   GFRAA 54 (L) 10/17/2019 1210   GFRAA 51 (L) 07/17/2019 1417    INo results found for: SPEP, UPEP  Lab Results  Component Value Date   WBC 6.2 10/17/2019   NEUTROABS 4.5 10/17/2019   HGB 12.3 10/17/2019   HCT 37.6 10/17/2019   MCV 88.5 10/17/2019   PLT 302 10/17/2019      Chemistry      Component Value Date/Time   NA 140 10/17/2019 1210   NA 141 03/03/2019 1542   NA 137 10/16/2017 1059   K 3.9 10/17/2019 1210   K 4.1 10/16/2017 1059   CL 106 10/17/2019 1210   CO2 23 10/17/2019 1210   CO2 25 10/16/2017 1059   BUN 14 10/17/2019 1210   BUN 13 03/03/2019 1542   BUN 20.3 10/16/2017 1059   CREATININE 1.18 (H) 10/17/2019 1210   CREATININE 1.23 (H) 07/17/2019 1417   CREATININE 1.2 (H) 10/16/2017 1059      Component Value Date/Time   CALCIUM 9.1 10/17/2019 1210   CALCIUM 9.3 10/16/2017 1059   ALKPHOS 82 10/17/2019 1210   ALKPHOS 48 10/16/2017 1059   AST 16 10/17/2019 1210   AST 16 07/17/2019 1417   AST 17 10/16/2017 1059   ALT 20 10/17/2019 1210   ALT 23 07/17/2019 1417   ALT 25 10/16/2017 1059   BILITOT 0.4 10/17/2019 1210   BILITOT 0.4 07/17/2019 1417   BILITOT 0.40 10/16/2017 1059       No results found for: LABCA2  No components found for: LABCA125  No results for input(s): INR in the last 168 hours.  Urinalysis No results found for: COLORURINE, APPEARANCEUR, LABSPEC, PHURINE, GLUCOSEU, HGBUR,  BILIRUBINUR, KETONESUR, PROTEINUR, UROBILINOGEN, NITRITE, LEUKOCYTESUR   STUDIES: No results found.   ELIGIBLE FOR AVAILABLE RESEARCH PROTOCOL: no  ASSESSMENT: 73 y.o[place], [place]  Woman status post biopsy of 2 separate masses in the central right breast 01/29/2017, showing a clinical mT2 N0 invasive ductal carcinoma, stage IB in the new classification: Both masses being estrogen receptor and progesterone receptor positive, HER-2 not amplified, with an MIB-1 between 5 and 10%.  (1) status post bilateral mastectomies 03/05/2017 showing  (a) on the left, no malignancy  (b) On the right, a pT3 pN0, stage IIA invasive ductal carcinoma, grade 2,  with negative margins    (c) patient is not interested in reconstruction  (2) Oncotype score of 6 predicts a 10 year risk of recurrence outside the breast of 5% if the patient's only systemic therapy is tamoxifen for 5 years. It also predicts no benefit from chemotherapy.  (3) adjuvant radiation 06/05/17 - 07/20/17 Right Chest Wall/ 50.4 Gy in 28 fractions Right axillaryNodal region// 45 Gy in 25 fractions Mastectomy scar boost 10 gray in 5 fractions   (4) started anastrozole May 2018--held 07/26/2019 for evaluation of neuropathy  (a) bone density at [facility] 04/24/2017 showed a T score of -0.3  (b) repeat bone density to be scheduled October 2020  (c) anastrozole resumed October 2020  (5) genetics testing through the [facility]  Panel offered by [facility] found no deleterious mutations in APC, ATM, AXIN2, BARD1, BMPR1A, BRCA1, BRCA2, BRIP1, CDH1, CDKN2A (p14ARF), CDKN2A (p16INK4a), CHEK2, DICER1, EPCAM (Deletion/duplication testing only), GREM1 (promoter region deletion/duplication testing only), KIT, MEN1, MLH1, MSH2, MSH6, MUTYH, NBN, NF1, PALB2, PDGFRA, PMS2, POLD1, POLE, PTEN, RAD50, RAD51C, RAD51D, SDHB, SDHC, SDHD, SMAD4, SMARCA4. STK11, TP53, TSC1, TSC2, and VHL.  The following gene was evaluated for sequence changes only: SDHA and  HOXB13 c.251G>A variant only  (a) 2 VUS were identified:  MSH6 c.3394G>C and TSC1 c.1481C>T  (6) tobacco abuse: Patient quit smoking 03/03/2017  (7) M-GUS: Multiple myeloma panel obtained 03/03/2019 shows an M protein of 0.3, IgG lambda  (a) normal IgG, IgA, and IgM levels and normal kappa/lambda ratio 10/17/2019  (b) repeat M-spike 10/17/2019 was 0.2  (8) neuropathy:  (a) labs 03/03/2019 show a hemoglobin A1c of 6.3 (prediabetes).  (b) brain MRI 07/07/2019 and lumbar MRI 08/04/2019 nondiagnostic  PLAN: Pandora's neuropathy did not improve being off anastrozole and we are going back on that medication.  She previously had tolerated it well.  We will set her up for a bone density before her return visit.  She is now 2-1/2 years out from definitive surgery for her breast cancer with no evidence of disease recurrence.  That is very favorable.  We are following her MGUS, which is unchanged and I gave her those records.  If her levels remain stable over the next year we will follow it only on a yearly basis thereafter  She will have a flu shot today.  She is keeping appropriate pandemic precautions  She knows to call for any issue that may develop before her next visit here which will be in 1 year   Sarajane Jews C. Monik Lins, MD  10/22/19 3:43 PM Medical Oncology and Hematology [facility] [address] [place],  [address] Tel. [phone]    Fax. [phone]   I, Wilburn Mylar, am acting as scribe for Dr. Virgie Dad. Burnett Spray.  I, Lurline Del MD, have reviewed the above documentation for accuracy and completeness, and I agree with the above.

## 2019-10-22 ENCOUNTER — Inpatient Hospital Stay (HOSPITAL_BASED_OUTPATIENT_CLINIC_OR_DEPARTMENT_OTHER): Payer: Medicare Other | Admitting: Oncology

## 2019-10-22 ENCOUNTER — Other Ambulatory Visit: Payer: Self-pay

## 2019-10-22 VITALS — BP 121/74 | HR 96 | Temp 98.5°F | Resp 18 | Ht 62.0 in | Wt 187.4 lb

## 2019-10-22 DIAGNOSIS — Z23 Encounter for immunization: Secondary | ICD-10-CM

## 2019-10-22 DIAGNOSIS — C50411 Malignant neoplasm of upper-outer quadrant of right female breast: Secondary | ICD-10-CM | POA: Diagnosis not present

## 2019-10-22 DIAGNOSIS — D472 Monoclonal gammopathy: Secondary | ICD-10-CM | POA: Diagnosis not present

## 2019-10-22 DIAGNOSIS — Z79811 Long term (current) use of aromatase inhibitors: Secondary | ICD-10-CM | POA: Diagnosis not present

## 2019-10-22 DIAGNOSIS — Z9013 Acquired absence of bilateral breasts and nipples: Secondary | ICD-10-CM | POA: Diagnosis not present

## 2019-10-22 DIAGNOSIS — Z170 Estrogen receptor positive status [ER+]: Secondary | ICD-10-CM | POA: Diagnosis not present

## 2019-10-22 DIAGNOSIS — C50111 Malignant neoplasm of central portion of right female breast: Secondary | ICD-10-CM | POA: Diagnosis not present

## 2019-10-22 MED ORDER — INFLUENZA VAC A&B SA ADJ QUAD 0.5 ML IM PRSY
PREFILLED_SYRINGE | INTRAMUSCULAR | Status: AC
  Filled 2019-10-22: qty 0.5

## 2019-10-22 MED ORDER — INFLUENZA VAC A&B SA ADJ QUAD 0.5 ML IM PRSY
0.5000 mL | PREFILLED_SYRINGE | Freq: Once | INTRAMUSCULAR | Status: AC
  Administered 2019-10-22: 16:00:00 0.5 mL via INTRAMUSCULAR

## 2019-10-23 DIAGNOSIS — I509 Heart failure, unspecified: Secondary | ICD-10-CM | POA: Diagnosis not present

## 2019-10-23 DIAGNOSIS — J439 Emphysema, unspecified: Secondary | ICD-10-CM | POA: Diagnosis not present

## 2019-10-23 DIAGNOSIS — I10 Essential (primary) hypertension: Secondary | ICD-10-CM | POA: Diagnosis not present

## 2019-10-23 DIAGNOSIS — E039 Hypothyroidism, unspecified: Secondary | ICD-10-CM | POA: Diagnosis not present

## 2019-10-23 DIAGNOSIS — J441 Chronic obstructive pulmonary disease with (acute) exacerbation: Secondary | ICD-10-CM | POA: Diagnosis not present

## 2019-10-23 DIAGNOSIS — F329 Major depressive disorder, single episode, unspecified: Secondary | ICD-10-CM | POA: Diagnosis not present

## 2019-10-23 DIAGNOSIS — F322 Major depressive disorder, single episode, severe without psychotic features: Secondary | ICD-10-CM | POA: Diagnosis not present

## 2019-10-23 DIAGNOSIS — C50919 Malignant neoplasm of unspecified site of unspecified female breast: Secondary | ICD-10-CM | POA: Diagnosis not present

## 2019-10-23 DIAGNOSIS — J45998 Other asthma: Secondary | ICD-10-CM | POA: Diagnosis not present

## 2019-10-23 DIAGNOSIS — E785 Hyperlipidemia, unspecified: Secondary | ICD-10-CM | POA: Diagnosis not present

## 2019-10-28 DIAGNOSIS — I509 Heart failure, unspecified: Secondary | ICD-10-CM | POA: Diagnosis not present

## 2019-10-28 DIAGNOSIS — E785 Hyperlipidemia, unspecified: Secondary | ICD-10-CM | POA: Diagnosis not present

## 2019-10-28 DIAGNOSIS — F329 Major depressive disorder, single episode, unspecified: Secondary | ICD-10-CM | POA: Diagnosis not present

## 2019-10-28 DIAGNOSIS — J441 Chronic obstructive pulmonary disease with (acute) exacerbation: Secondary | ICD-10-CM | POA: Diagnosis not present

## 2019-10-28 DIAGNOSIS — J45998 Other asthma: Secondary | ICD-10-CM | POA: Diagnosis not present

## 2019-10-28 DIAGNOSIS — F322 Major depressive disorder, single episode, severe without psychotic features: Secondary | ICD-10-CM | POA: Diagnosis not present

## 2019-10-28 DIAGNOSIS — E039 Hypothyroidism, unspecified: Secondary | ICD-10-CM | POA: Diagnosis not present

## 2019-10-28 DIAGNOSIS — C50919 Malignant neoplasm of unspecified site of unspecified female breast: Secondary | ICD-10-CM | POA: Diagnosis not present

## 2019-10-28 DIAGNOSIS — J439 Emphysema, unspecified: Secondary | ICD-10-CM | POA: Diagnosis not present

## 2019-10-28 DIAGNOSIS — I10 Essential (primary) hypertension: Secondary | ICD-10-CM | POA: Diagnosis not present

## 2020-01-06 DIAGNOSIS — I509 Heart failure, unspecified: Secondary | ICD-10-CM | POA: Diagnosis not present

## 2020-01-06 DIAGNOSIS — J45998 Other asthma: Secondary | ICD-10-CM | POA: Diagnosis not present

## 2020-01-06 DIAGNOSIS — F329 Major depressive disorder, single episode, unspecified: Secondary | ICD-10-CM | POA: Diagnosis not present

## 2020-01-06 DIAGNOSIS — E785 Hyperlipidemia, unspecified: Secondary | ICD-10-CM | POA: Diagnosis not present

## 2020-01-06 DIAGNOSIS — I10 Essential (primary) hypertension: Secondary | ICD-10-CM | POA: Diagnosis not present

## 2020-01-06 DIAGNOSIS — J439 Emphysema, unspecified: Secondary | ICD-10-CM | POA: Diagnosis not present

## 2020-01-06 DIAGNOSIS — J441 Chronic obstructive pulmonary disease with (acute) exacerbation: Secondary | ICD-10-CM | POA: Diagnosis not present

## 2020-01-06 DIAGNOSIS — F322 Major depressive disorder, single episode, severe without psychotic features: Secondary | ICD-10-CM | POA: Diagnosis not present

## 2020-01-06 DIAGNOSIS — E039 Hypothyroidism, unspecified: Secondary | ICD-10-CM | POA: Diagnosis not present

## 2020-01-06 DIAGNOSIS — C50919 Malignant neoplasm of unspecified site of unspecified female breast: Secondary | ICD-10-CM | POA: Diagnosis not present

## 2020-02-04 DIAGNOSIS — F329 Major depressive disorder, single episode, unspecified: Secondary | ICD-10-CM | POA: Diagnosis not present

## 2020-02-04 DIAGNOSIS — J441 Chronic obstructive pulmonary disease with (acute) exacerbation: Secondary | ICD-10-CM | POA: Diagnosis not present

## 2020-02-04 DIAGNOSIS — E785 Hyperlipidemia, unspecified: Secondary | ICD-10-CM | POA: Diagnosis not present

## 2020-02-04 DIAGNOSIS — F322 Major depressive disorder, single episode, severe without psychotic features: Secondary | ICD-10-CM | POA: Diagnosis not present

## 2020-02-04 DIAGNOSIS — J439 Emphysema, unspecified: Secondary | ICD-10-CM | POA: Diagnosis not present

## 2020-02-04 DIAGNOSIS — E039 Hypothyroidism, unspecified: Secondary | ICD-10-CM | POA: Diagnosis not present

## 2020-02-04 DIAGNOSIS — C50919 Malignant neoplasm of unspecified site of unspecified female breast: Secondary | ICD-10-CM | POA: Diagnosis not present

## 2020-02-04 DIAGNOSIS — I509 Heart failure, unspecified: Secondary | ICD-10-CM | POA: Diagnosis not present

## 2020-02-04 DIAGNOSIS — I10 Essential (primary) hypertension: Secondary | ICD-10-CM | POA: Diagnosis not present

## 2020-02-04 DIAGNOSIS — J45998 Other asthma: Secondary | ICD-10-CM | POA: Diagnosis not present

## 2020-02-24 ENCOUNTER — Telehealth: Payer: Self-pay | Admitting: Internal Medicine

## 2020-02-24 NOTE — Telephone Encounter (Signed)
Patient last seen by Dr. Chase Caller 09/17/2018. There are no future appointments or future recalls listed.  The patient will need an appointment to be seen by Dr. Chase Caller in order to determine if the Symbicort and Spiriva are still appropriate to use for her COPD.  LVM for Kimberly Waters to advise the patient has not been seen since 09/17/2018 and appointment would be needed. In addition there are no samples of Symbicort from the manufacturer any longer. Samples cannot be provided for a patient that has not been seen in over a year.  Nothing further needed at this time.

## 2020-03-16 DIAGNOSIS — F322 Major depressive disorder, single episode, severe without psychotic features: Secondary | ICD-10-CM | POA: Diagnosis not present

## 2020-03-16 DIAGNOSIS — J441 Chronic obstructive pulmonary disease with (acute) exacerbation: Secondary | ICD-10-CM | POA: Diagnosis not present

## 2020-03-16 DIAGNOSIS — E785 Hyperlipidemia, unspecified: Secondary | ICD-10-CM | POA: Diagnosis not present

## 2020-03-16 DIAGNOSIS — J439 Emphysema, unspecified: Secondary | ICD-10-CM | POA: Diagnosis not present

## 2020-03-16 DIAGNOSIS — I10 Essential (primary) hypertension: Secondary | ICD-10-CM | POA: Diagnosis not present

## 2020-03-16 DIAGNOSIS — G4700 Insomnia, unspecified: Secondary | ICD-10-CM | POA: Diagnosis not present

## 2020-03-16 DIAGNOSIS — F329 Major depressive disorder, single episode, unspecified: Secondary | ICD-10-CM | POA: Diagnosis not present

## 2020-03-16 DIAGNOSIS — I509 Heart failure, unspecified: Secondary | ICD-10-CM | POA: Diagnosis not present

## 2020-03-16 DIAGNOSIS — C50919 Malignant neoplasm of unspecified site of unspecified female breast: Secondary | ICD-10-CM | POA: Diagnosis not present

## 2020-03-16 DIAGNOSIS — J45998 Other asthma: Secondary | ICD-10-CM | POA: Diagnosis not present

## 2020-03-16 DIAGNOSIS — E039 Hypothyroidism, unspecified: Secondary | ICD-10-CM | POA: Diagnosis not present

## 2020-04-23 ENCOUNTER — Ambulatory Visit (INDEPENDENT_AMBULATORY_CARE_PROVIDER_SITE_OTHER): Payer: Medicare Other | Admitting: Cardiology

## 2020-04-23 ENCOUNTER — Other Ambulatory Visit: Payer: Self-pay

## 2020-04-23 ENCOUNTER — Encounter: Payer: Self-pay | Admitting: Cardiology

## 2020-04-23 VITALS — BP 112/66 | HR 90 | Ht 62.0 in | Wt 186.0 lb

## 2020-04-23 DIAGNOSIS — Z01812 Encounter for preprocedural laboratory examination: Secondary | ICD-10-CM

## 2020-04-23 DIAGNOSIS — I2584 Coronary atherosclerosis due to calcified coronary lesion: Secondary | ICD-10-CM | POA: Diagnosis not present

## 2020-04-23 DIAGNOSIS — R0600 Dyspnea, unspecified: Secondary | ICD-10-CM | POA: Diagnosis not present

## 2020-04-23 DIAGNOSIS — I2510 Atherosclerotic heart disease of native coronary artery without angina pectoris: Secondary | ICD-10-CM

## 2020-04-23 DIAGNOSIS — J438 Other emphysema: Secondary | ICD-10-CM

## 2020-04-23 DIAGNOSIS — R079 Chest pain, unspecified: Secondary | ICD-10-CM | POA: Diagnosis not present

## 2020-04-23 DIAGNOSIS — I700 Atherosclerosis of aorta: Secondary | ICD-10-CM

## 2020-04-23 DIAGNOSIS — R0609 Other forms of dyspnea: Secondary | ICD-10-CM

## 2020-04-23 MED ORDER — METOPROLOL TARTRATE 100 MG PO TABS
100.0000 mg | ORAL_TABLET | Freq: Once | ORAL | 0 refills | Status: DC
Start: 2020-04-23 — End: 2020-10-26

## 2020-04-23 NOTE — Patient Instructions (Addendum)
Medication Instructions:  Discontinue Lisinopril. Continue all other medications as listed.  *If you need a refill on your cardiac medications before your next appointment, please call your pharmacy*  Lab Work: Please have blood work for CT scan (BMP) If you have labs (blood work) drawn today and your tests are completely normal, you will receive your results only by: Marland Kitchen MyChart Message (if you have MyChart) OR . A paper copy in the mail If you have any lab test that is abnormal or we need to change your treatment, we will call you to review the results.  Testing/Procedures: Your physician has requested that you have Coronary CT. Cardiac computed tomography (CT) is a painless test that uses an x-ray machine to take clear, detailed pictures of your heart.   You have been referred to Pulmonary for further evaluation of Dyspnea.  Follow-Up: At [facility] you and your health needs are our priority.  As part of our continuing mission to provide you with exceptional heart care, we have created designated Provider Care Teams.  These Care Teams include your primary Cardiologist (physician) and Advanced Practice Providers (APPs -  Physician Assistants and Nurse Practitioners) who all work together to provide you with the care you need, when you need it.  We recommend signing up for the patient portal called "MyChart".  Sign up information is provided on this After Visit Summary.  MyChart is used to connect with patients for Virtual Visits (Telemedicine).  Patients are able to view lab/test results, encounter notes, upcoming appointments, etc.  Non-urgent messages can be sent to your provider as well.   To learn more about what you can do with MyChart, go to [email or web address].    Your next appointment:   6 month(s)  The format for your next appointment:   In Person  Provider:   Candee Furbish, MD  Thank you for choosing [facility]!!     Your cardiac CT will be scheduled  at:   [facility] [address] [place], [place] [address] [phone]  Please arrive at the [facility] main entrance of [facility] 30 minutes prior to test start time. Proceed to the [facility] Radiology Department (first floor) to check-in and test prep.  Please follow these instructions carefully (unless otherwise directed):   On the Night Before the Test: . Be sure to Drink plenty of water. . Do not consume any caffeinated/decaffeinated beverages or chocolate 12 hours prior to your test. . Do not take any antihistamines 12 hours prior to your test. . If you take Metformin do not take 24 hours prior to test.  On the Day of the Test: . Drink plenty of water. Do not drink any water within one hour of the test. . Do not eat any food 4 hours prior to the test. . You may take your regular medications prior to the test.  . Take metoprolol (Lopressor) two hours prior to test. . HOLD Furosemide/Hydrochlorothiazide morning of the test. . FEMALES- please wear underwire-free bra if available      After the Test: . Drink plenty of water. . After receiving IV contrast, you may experience a mild flushed feeling. This is normal. . On occasion, you may experience a mild rash up to 24 hours after the test. This is not dangerous. If this occurs, you can take Benadryl 25 mg and increase your fluid intake. . If you experience trouble breathing, this can be serious. If it is severe call 911 IMMEDIATELY.  If it is mild, please call our office. . If you take any of these medications: Glipizide/Metformin, Avandament, Glucavance, please do not take 48 hours after completing test unless otherwise instructed.   Once we have confirmed authorization from your insurance company, we will call you to set up a date and time for your test.   For non-scheduling related questions, please contact the cardiac imaging nurse navigator should you have any questions/concerns: Marchia Bond, RN  Navigator Cardiac Imaging [facility] [phone] office  For scheduling needs, including cancellations and rescheduling, please call [phone].    CT Scan  A CT scan is a kind of X-ray. A CT scan makes pictures of the inside of your body. In this procedure, the pictures will be taken in a large machine that has an opening (CT scanner). What happens before the procedure? Staying hydrated Follow instructions from your doctor about hydration, which may include:  Up to 2 hours before the procedure - you may continue to drink clear liquids. These include water, clear fruit juice, black coffee, and plain tea. Eating and drinking restrictions Follow instructions from your doctor about eating and drinking, which may include:  24 hours before the procedure - stop drinking caffeinated drinks. These include energy drinks, tea, soda, coffee, and hot chocolate.  8 hours before the procedure - stop eating heavy meals or foods. These include meat, fried foods, or fatty foods.  6 hours before the procedure - stop eating light meals or foods. These include toast or cereal.  6 hours before the procedure - stop drinking milk or drinks that have milk in them.  2 hours before the procedure - stop drinking clear liquids. General instructions  Take off any jewelry.  Ask your doctor about changing or stopping your normal medicines. This is important if you take diabetes medicines or blood thinners. What happens during the procedure?  You will lie on a table with your arms above your head.  An IV tube may be put into one of your veins.  Dye may be put into the IV tube. You may feel warm or have a metal taste in your mouth.  The table you will be lying on will move into the CT scanner.  You will be able to see, hear, and talk to the person who is running the machine while you are in it. Follow that person's directions.  The machine will move around you to take  pictures. Do not move.  When the machine is done taking pictures, it will be turned off.  The table will be moved out of the machine.  Your IV tube will be taken out. The procedure may vary among doctors and hospitals. What happens after the procedure?  It is up to you to get your results. Ask when your results will be ready. Summary  A CT scan is a kind of X-ray.  A CT scan makes pictures of the inside of your body.  Follow instructions from your doctor about eating and drinking before the procedure.  You will be able to see, hear, and talk to the person who is running the machine while you are in it. Follow that person's directions. This information is not intended to replace advice given to you by your health care provider. Make sure you discuss any questions you have with your health care provider. Document Revised: 12/28/2016 Document Reviewed: 12/28/2016 Elsevier Patient Education  [place].

## 2020-04-23 NOTE — Progress Notes (Signed)
Cardiology Office Note:    Date:  04/23/2020   ID:  Kimberly Waters, DOB [birth year], MRN [id number]  PCP:  Josetta Huddle, MD  Cardiologist:  Candee Furbish, MD  Electrophysiologist:  None   Referring MD: Josetta Huddle, MD     History of Present Illness:    Kimberly Waters is a 74 y.o. female here for the follow-up of chronic diastolic heart failure hyperlipidemia hypertension with history of persistent asthma breast cancer mastectomy mild chronic kidney disease.  In 2019 had an echocardiogram that showed normal EF grade 1 diastolic dysfunction with mild MR  Nuclear stress test 2019 showed a little bit of inferior and apical thinning but no ischemia no scar.  At last check in October 2020 she was having some mild exertional shortness of breath unchanged from prior visit.  Mild dependent edema.  At that time it was decided to continue Lasix and lisinopril.  Continue with aggressive secondary risk factor prevention.  Up stairs from basement worse DOE quite significant. Feels horrible. No energy. Lay around. Chest pressure fullness.   Mother had CAD 2-3 stents, died a year ago.   Quit tobacco 3 years ago with breast cancer.   Past Medical History:  Diagnosis Date  . Allergy   . Breast cancer ([place])   . Colon polyps   . Depression   . Dyspnea    SEASONAL  . Family history of breast cancer   . Family history of colon cancer   . GERD (gastroesophageal reflux disease)   . History of radiation therapy 06/05/17-07/20/17   right chest wall 50.4 Gy in 28 fractions, right axillary nodal region 45 Gy in 25 fractions  . Hyperlipidemia   . Hypertension   . Hypothyroidism   . Peripheral neuropathy 06/18/2019  . PONV (postoperative nausea and vomiting)     Past Surgical History:  Procedure Laterality Date  . ABDOMINAL HYSTERECTOMY    . KNEE SURGERY Right   . MASTECTOMY W/ SENTINEL NODE BIOPSY Right 03/05/2017   Procedure: BILATERAL TOTAL MASTECTOMIES WITH RIGHT SENTINEL LYMPH NODE BIOPSIES;   Surgeon: Excell Seltzer, MD;  Location: [facility];  Service: General;  Laterality: Right;  . SHOULDER SURGERY     BILAT    Current Medications: Current Meds  Medication Sig  . albuterol (PROVENTIL,VENTOLIN) 90 MCG/ACT inhaler Inhale 2 puffs into the lungs every 4 (four) hours as needed.  Marland Kitchen aspirin EC 81 MG EC tablet Take 1 tablet (81 mg total) by mouth daily.  . budesonide-formoterol (SYMBICORT) 80-4.5 MCG/ACT inhaler Inhale 2 puffs into the lungs 2 (two) times daily.  Marland Kitchen buPROPion (WELLBUTRIN SR) 150 MG 12 hr tablet Take 150 mg by mouth daily.   . diphenhydrAMINE (BENADRYL) 25 MG tablet Take 25 mg by mouth as needed for sleep.  Marland Kitchen escitalopram (LEXAPRO) 10 MG tablet Take 1 tablet (10 mg total) by mouth daily.  . furosemide (LASIX) 40 MG tablet Take 1 tablet (40 mg total) by mouth daily.  . hydrOXYzine (ATARAX/VISTARIL) 25 MG tablet Take 25 mg by mouth at bedtime.  Marland Kitchen imipramine (TOFRANIL) 50 MG tablet Take 50 mg by mouth at bedtime.  Marland Kitchen ipratropium (ATROVENT) 0.06 % nasal spray Place 2 sprays into the nose 4 (four) times daily.  Marland Kitchen levothyroxine (SYNTHROID, LEVOTHROID) 112 MCG tablet TAKE ONE TABLET BY MOUTH ONCE DAILY  . lisinopril-hydrochlorothiazide (ZESTORETIC) 20-25 MG tablet Take 1 tablet by mouth daily.  Marland Kitchen omeprazole (PRILOSEC) 20 MG capsule Take 20 mg by mouth daily.  . Potassium Chloride ER  20 MEQ TBCR Take 20 mEq by mouth daily.  . potassium chloride SA (K-DUR,KLOR-CON) 20 MEQ tablet Take 1 tablet (20 mEq total) by mouth daily.  . simvastatin (ZOCOR) 20 MG tablet Take 1 tablet (20 mg total) by mouth at bedtime.  . [DISCONTINUED] lisinopril (PRINIVIL,ZESTRIL) 2.5 MG tablet Take 1 tablet (2.5 mg total) by mouth daily.     Allergies:   Adhesive [tape], Codeine, and Other   Social History   Socioeconomic History  . Marital status: Married    Spouse name: Not on file  . Number of children: 2  . Years of education: Not on file  . Highest education level: Not on file  Occupational  History  . Occupation: reitred [employer]  Tobacco Use  . Smoking status: Former Smoker    Packs/day: 1.00    Years: 45.00    Pack years: 45.00    Types: Cigarettes    Quit date: 02/22/2017    Years since quitting: 3.1  . Smokeless tobacco: Never Used  Substance and Sexual Activity  . Alcohol use: No    Alcohol/week: 0.0 standard drinks  . Drug use: No  . Sexual activity: Not on file  Other Topics Concern  . Not on file  Social History Narrative  . Not on file   Social Determinants of Health   Financial Resource Strain:   . Difficulty of Paying Living Expenses:   Food Insecurity:   . Worried About Charity fundraiser in the Last Year:   . Arboriculturist in the Last Year:   Transportation Needs:   . Film/video editor (Medical):   Marland Kitchen Lack of Transportation (Non-Medical):   Physical Activity:   . Days of Exercise per Week:   . Minutes of Exercise per Session:   Stress:   . Feeling of Stress :   Social Connections:   . Frequency of Communication with Friends and Family:   . Frequency of Social Gatherings with Friends and Family:   . Attends Religious Services:   . Active Member of Clubs or Organizations:   . Attends Archivist Meetings:   Marland Kitchen Marital Status:      Family History: The patient's family history includes Breast cancer (age of onset: 27) in her cousin; Breast cancer (age of onset: 39) in her paternal aunt; Colon cancer in her maternal uncle and maternal uncle; Colon cancer (age of onset: 61) in her maternal grandfather; Colon cancer (age of onset: 87) in her maternal aunt; Head & neck cancer in her maternal uncle; Heart disease (age of onset: 59) in her mother; Lung cancer in her paternal grandfather.  ROS:   Please see the history of present illness.    No syncope no bleeding no orthopnea no PND all other systems reviewed and are negative.  EKGs/Labs/Other Studies Reviewed:    The following studies were reviewed today: Echo stress  test as above 2019 reassuring  EKG:  EKG is not ordered today.  Heart rate currently 90.  Recent Labs: 10/17/2019: ALT 20; BUN 14; Creatinine, Ser 1.18; Hemoglobin 12.3; Platelets 302; Potassium 3.9; Sodium 140  Recent Lipid Panel    Component Value Date/Time   CHOL 188 12/09/2008 2139   TRIG 216 (H) 12/09/2008 2139   HDL 39 (L) 12/09/2008 2139   CHOLHDL 4.8 Ratio 12/09/2008 2139   VLDL 43 (H) 12/09/2008 2139   LDLCALC 106 (H) 12/09/2008 2139   LDLDIRECT 120 (H) 02/12/2013 1536    Physical Exam:  VS:  BP 112/66   Pulse 90   Ht 5\' 2"  (1.575 m)   Wt 186 lb (84.4 kg)   SpO2 96%   BMI 34.02 kg/m     Wt Readings from Last 3 Encounters:  04/23/20 186 lb (84.4 kg)  10/22/19 187 lb 6.4 oz (85 kg)  10/20/19 185 lb (83.9 kg)     GEN:  Well nourished, well developed in no acute distress HEENT: Normal NECK: No JVD; No carotid bruits LYMPHATICS: No lymphadenopathy CARDIAC: RRR, no murmurs, rubs, gallops RESPIRATORY: Somewhat distant breath sounds, no wheezing or rhonchi  ABDOMEN: Soft, non-tender, non-distended MUSCULOSKELETAL:  No edema; No deformity  SKIN: Warm and dry NEUROLOGIC:  Alert and oriented x 3 PSYCHIATRIC:  Normal affect   ASSESSMENT:    1. Chest pain, unspecified type   2. Aortic atherosclerosis ([place])   3. Coronary artery calcification   4. Other emphysema ([place])   5. Pre-procedure lab exam   6. Dyspnea on exertion    PLAN:    In order of problems listed above:  Continued dyspnea on exertion, lack of energy, may be unstable anginal equivalent -We will go ahead and check a coronary CT scan with possible FFR analysis for further evaluation.  Most recently, echocardiogram showed normal EF.  Nuclear stress test in 2019 showed no evidence of significant ischemia.  Her symptoms are quite pronounced.  They seem to be progressive.  If CT scan is unremarkable, I think further pulmonary evaluation would be in order.  She was a smoker for quite some time.  She has  seen Dr. Chase Caller in the past who ordered a high-resolution CT scan in 2019 which did show some coronary artery calcification, showed emphysema but no evidence of interstitial lung disease.  We will send in another referral to pulmonary for return visit.  Coronary artery calcification -Continue with secondary prevention efforts.  On statin.  Aspirin.  Aortic atherosclerosis -Continue with statin and aspirin  COPD with stage II classification -Dr. Golden Pop note from 09/17/2018 reviewed.  I would be fine with discontinuing her lisinopril 2.5 mg at this point.   Medication Adjustments/Labs and Tests Ordered: Current medicines are reviewed at length with the patient today.  Concerns regarding medicines are outlined above.  Orders Placed This Encounter  Procedures  . CT CORONARY MORPH W/CTA COR W/SCORE W/CA W/CM &/OR WO/CM  . CT CORONARY FRACTIONAL FLOW RESERVE DATA PREP  . CT CORONARY FRACTIONAL FLOW RESERVE FLUID ANALYSIS  . Basic metabolic panel  . Ambulatory referral to Pulmonology   Meds ordered this encounter  Medications  . metoprolol tartrate (LOPRESSOR) 100 MG tablet    Sig: Take 1 tablet (100 mg total) by mouth once for 1 dose.    Dispense:  1 tablet    Refill:  0    Patient Instructions  Medication Instructions:  Discontinue Lisinopril. Continue all other medications as listed.  *If you need a refill on your cardiac medications before your next appointment, please call your pharmacy*  Lab Work: Please have blood work for CT scan (BMP) If you have labs (blood work) drawn today and your tests are completely normal, you will receive your results only by: Marland Kitchen MyChart Message (if you have MyChart) OR . A paper copy in the mail If you have any lab test that is abnormal or we need to change your treatment, we will call you to review the results.  Testing/Procedures: Your physician has requested that you have Coronary CT. Cardiac computed tomography (CT) is  a painless test  that uses an x-ray machine to take clear, detailed pictures of your heart.   You have been referred to Pulmonary for further evaluation of Dyspnea.  Follow-Up: At [facility], you and your health needs are our priority.  As part of our continuing mission to provide you with exceptional heart care, we have created designated Provider Care Teams.  These Care Teams include your primary Cardiologist (physician) and Advanced Practice Providers (APPs -  Physician Assistants and Nurse Practitioners) who all work together to provide you with the care you need, when you need it.  We recommend signing up for the patient portal called "MyChart".  Sign up information is provided on this After Visit Summary.  MyChart is used to connect with patients for Virtual Visits (Telemedicine).  Patients are able to view lab/test results, encounter notes, upcoming appointments, etc.  Non-urgent messages can be sent to your provider as well.   To learn more about what you can do with MyChart, go to [email or web address].    Your next appointment:   6 month(s)  The format for your next appointment:   In Person  Provider:   Candee Furbish, MD  Thank you for choosing [facility]!!     Your cardiac CT will be scheduled at:   [facility] [address] [place], [place] [address] [phone]  Please arrive at the [facility] main entrance of [facility] 30 minutes prior to test start time. Proceed to the [facility] Radiology Department (first floor) to check-in and test prep.  Please follow these instructions carefully (unless otherwise directed):   On the Night Before the Test: . Be sure to Drink plenty of water. . Do not consume any caffeinated/decaffeinated beverages or chocolate 12 hours prior to your test. . Do not take any antihistamines 12 hours prior to your test. . If you take Metformin do not take 24 hours prior to test.  On the Day of the Test: . Drink  plenty of water. Do not drink any water within one hour of the test. . Do not eat any food 4 hours prior to the test. . You may take your regular medications prior to the test.  . Take metoprolol (Lopressor) two hours prior to test. . HOLD Furosemide/Hydrochlorothiazide morning of the test. . FEMALES- please wear underwire-free bra if available      After the Test: . Drink plenty of water. . After receiving IV contrast, you may experience a mild flushed feeling. This is normal. . On occasion, you may experience a mild rash up to 24 hours after the test. This is not dangerous. If this occurs, you can take Benadryl 25 mg and increase your fluid intake. . If you experience trouble breathing, this can be serious. If it is severe call 911 IMMEDIATELY. If it is mild, please call our office. . If you take any of these medications: Glipizide/Metformin, Avandament, Glucavance, please do not take 48 hours after completing test unless otherwise instructed.   Once we have confirmed authorization from your insurance company, we will call you to set up a date and time for your test.   For non-scheduling related questions, please contact the cardiac imaging nurse navigator should you have any questions/concerns: Marchia Bond, RN Navigator Cardiac Imaging [facility] [phone] office  For scheduling needs, including cancellations and rescheduling, please call [phone].    CT Scan  A CT scan is a kind of X-ray. A CT  scan makes pictures of the inside of your body. In this procedure, the pictures will be taken in a large machine that has an opening (CT scanner). What happens before the procedure? Staying hydrated Follow instructions from your doctor about hydration, which may include:  Up to 2 hours before the procedure - you may continue to drink clear liquids. These include water, clear fruit juice, black coffee, and plain tea. Eating and drinking  restrictions Follow instructions from your doctor about eating and drinking, which may include:  24 hours before the procedure - stop drinking caffeinated drinks. These include energy drinks, tea, soda, coffee, and hot chocolate.  8 hours before the procedure - stop eating heavy meals or foods. These include meat, fried foods, or fatty foods.  6 hours before the procedure - stop eating light meals or foods. These include toast or cereal.  6 hours before the procedure - stop drinking milk or drinks that have milk in them.  2 hours before the procedure - stop drinking clear liquids. General instructions  Take off any jewelry.  Ask your doctor about changing or stopping your normal medicines. This is important if you take diabetes medicines or blood thinners. What happens during the procedure?  You will lie on a table with your arms above your head.  An IV tube may be put into one of your veins.  Dye may be put into the IV tube. You may feel warm or have a metal taste in your mouth.  The table you will be lying on will move into the CT scanner.  You will be able to see, hear, and talk to the person who is running the machine while you are in it. Follow that person's directions.  The machine will move around you to take pictures. Do not move.  When the machine is done taking pictures, it will be turned off.  The table will be moved out of the machine.  Your IV tube will be taken out. The procedure may vary among doctors and hospitals. What happens after the procedure?  It is up to you to get your results. Ask when your results will be ready. Summary  A CT scan is a kind of X-ray.  A CT scan makes pictures of the inside of your body.  Follow instructions from your doctor about eating and drinking before the procedure.  You will be able to see, hear, and talk to the person who is running the machine while you are in it. Follow that person's directions. This information is  not intended to replace advice given to you by your health care provider. Make sure you discuss any questions you have with your health care provider. Document Revised: 12/28/2016 Document Reviewed: 12/28/2016 Elsevier Patient Education  [place], Candee Furbish, MD  04/23/2020 5:13 PM    [place]

## 2020-05-12 ENCOUNTER — Other Ambulatory Visit: Payer: Medicare Other | Admitting: *Deleted

## 2020-05-12 ENCOUNTER — Other Ambulatory Visit: Payer: Self-pay

## 2020-05-12 DIAGNOSIS — Z01812 Encounter for preprocedural laboratory examination: Secondary | ICD-10-CM | POA: Diagnosis not present

## 2020-05-12 LAB — BASIC METABOLIC PANEL
BUN/Creatinine Ratio: 9 — ABNORMAL LOW (ref 12–28)
BUN: 11 mg/dL (ref 8–27)
CO2: 25 mmol/L (ref 20–29)
Calcium: 9.2 mg/dL (ref 8.7–10.3)
Chloride: 101 mmol/L (ref 96–106)
Creatinine, Ser: 1.24 mg/dL — ABNORMAL HIGH (ref 0.57–1.00)
GFR calc Af Amer: 50 mL/min/{1.73_m2} — ABNORMAL LOW (ref >59)
GFR calc non Af Amer: 43 mL/min/{1.73_m2} — ABNORMAL LOW (ref >59)
Glucose: 88 mg/dL (ref 65–99)
Potassium: 3.8 mmol/L (ref 3.5–5.2)
Sodium: 141 mmol/L (ref 134–144)

## 2020-05-13 ENCOUNTER — Ambulatory Visit (INDEPENDENT_AMBULATORY_CARE_PROVIDER_SITE_OTHER): Payer: Medicare Other | Admitting: Internal Medicine

## 2020-05-13 ENCOUNTER — Telehealth (HOSPITAL_COMMUNITY): Payer: Self-pay | Admitting: *Deleted

## 2020-05-13 ENCOUNTER — Encounter: Payer: Self-pay | Admitting: Internal Medicine

## 2020-05-13 VITALS — BP 122/72 | HR 93 | Temp 97.2°F | Ht 62.0 in | Wt 183.8 lb

## 2020-05-13 DIAGNOSIS — Z87891 Personal history of nicotine dependence: Secondary | ICD-10-CM

## 2020-05-13 DIAGNOSIS — R0600 Dyspnea, unspecified: Secondary | ICD-10-CM

## 2020-05-13 DIAGNOSIS — R0609 Other forms of dyspnea: Secondary | ICD-10-CM

## 2020-05-13 DIAGNOSIS — I2510 Atherosclerotic heart disease of native coronary artery without angina pectoris: Secondary | ICD-10-CM

## 2020-05-13 DIAGNOSIS — R0602 Shortness of breath: Secondary | ICD-10-CM

## 2020-05-13 DIAGNOSIS — J449 Chronic obstructive pulmonary disease, unspecified: Secondary | ICD-10-CM | POA: Diagnosis not present

## 2020-05-13 DIAGNOSIS — I2584 Coronary atherosclerosis due to calcified coronary lesion: Secondary | ICD-10-CM

## 2020-05-13 MED ORDER — BREZTRI AEROSPHERE 160-9-4.8 MCG/ACT IN AERO: 2.0000 | INHALATION_SPRAY | Freq: Two times a day (BID) | RESPIRATORY_TRACT | 0 refills | Status: DC

## 2020-05-13 MED ORDER — BREZTRI AEROSPHERE 160-9-4.8 MCG/ACT IN AERO: 2.0000 | INHALATION_SPRAY | Freq: Two times a day (BID) | RESPIRATORY_TRACT | 5 refills | Status: DC

## 2020-05-13 NOTE — Telephone Encounter (Signed)
Attempted to call patient regarding upcoming cardiac CT appointment. Left message on voicemail with name and callback number  [name] Tai RN Navigator Cardiac Imaging [facility] [phone] Office [phone] Cell  

## 2020-05-13 NOTE — Patient Instructions (Addendum)
ICD-10-CM   1. Stage 2 moderate COPD by GOLD classification ([place])  J44.9   2. Stopped smoking with greater than 40 pack year history  Z87.891   3. Dyspnea on exertion  R06.00     Symptoms may be worse after running out of inhalers of Spiriva and Symbicort  Plan -Stop Spiriva and Symbicort -Instead start BREZTRI 2 puffs 2 times daily -take 1 month sample and prescription  -If too expensive let us know -Take  albuterol as needed -Add CT chest without contrast because of dyspnea on exertion  -Add this to tomorrow's CT scan of the chest being done for coronary artery calcification  Follow-up -We will call with the results of the CT scan -4 to 6 weeks with nurse practitioner to report progress with new inhaler  -Do med review at that time

## 2020-05-13 NOTE — Progress Notes (Signed)
IOV 03/08/2018  Chief Complaint  Patient presents with  . Pulmonary Consult    dyspnea since she had breast cancer last year, very fatigue, itchy skin     74 year old obese lady with a previous 45 pack smoking history quit 1 year ago when she had double mastectomy for early stage breast cancer.  She tells me for the last few years even preceding her mastectomy she has had insidious onset of shortness of breath that is slowly progressive.  Brought on by climbing 1 flight of stairs and relieved by rest.  No associated chest pain.  There is associated cough and wheezing although not associated with dyspnea per se but happens randomly at night and in the daytime.  Albuterol inhaler helps with the cough and the wheezing.  Associated with the cough and the wheezing is lisinopril intake.  She does not have any orthopnea proximal nocturnal dyspnea.  The dyspnea happens purely at exertion and relieved by rest.  And it is progressive.  In 2016 she had low-dose CT Scan of the chest for lung cancer screening and this did not show any lung cancer.  Follow-up was recommended in 1 year.  It did show evidence of emphysema but she is unaware of this diagnosis.  She has a history of radiation therapy   05/28/2018 Pt. Presents for follow up. She was admitted to the hospital 4 days after being seen in the office on 03/28/2018. Admit 04/01/2018>>  Discharged 04/04/2018. She was admitted for pulmonary edema and acute diastolic HF exacerbation.  She has a multifactorial process consisting of diastolic heart failure exacerbation superimposed on underlying COPD/obesity as well as anxiety. She was diuresed for 4 L of fluid. Echo done prior to admission this admission  showed 55-60% EF with grade 1 diastolic dysfunction.PA peak pressure was 29 mmHg.She was started on ASA, Lisinopril 2.5 mg daily and BB, with recommendation of close cardiology follow up.       Pt.  states she is much better since admission. She is weighing  herself daily, and knows to call her PCP/ cards for weight gain.  She was discharged on a prednisone taper, which she states she completed. She is compliant with her  Symbicort, and Spiriva. Both   Have worked  well for her, and she has much less dyspnea,  but she cannot afford them.We checked with her pharmacy and she is in her donut hole.We will send her some with samples as able. Her dose of Lexapro was increased to 10 mg daily for her anxiety and depression. She was noted to have normocytic anemia without blood loss, so most  likely due to chronic disease. She denies fever, chest pain, orthopnea or hemoptysis.   Body mass index is 35.37 kg/m.>> encouraged to work on weight loss.  Test Results: Echo 04/2018>> EF 0000000, grade 1 diastolic dysfunction PAP 29 mm Hg  PFT's 4/3//2019>> FVC: 1.62, 2.75, 58% FEV1 1.09, 2.07, 52% F/F Ratio: 67,76, 88% SVC 1.83,2.75,66% TLC 88% DLCO: 46%  HRCT 03/26/2018 IMPRESSION: 1. No definitive evidence of interstitial lung disease. 2. Mild pulmonary edema and small right pleural effusion. 3. Aortic atherosclerosis (ICD10-170.0). Coronary artery calcification. 4. Emphysema (ICD10-J43.9). 5. Hepatic steatosis. 6. Left adrenal adenoma.      OV 09/17/2018  Subjective:  Patient ID: Kimberly Waters, female , DOB: [birth year] , age 35 y.o. , MRN: [id number] , ADDRESS: [place] [address]   09/17/2018 -   Chief Complaint  Patient presents  with  . Follow-up    Pt states she has been okay since last visit. States she has had SOB with exertion, occ cough, and occ CP.     HPI Kimberly Waters 74 y.o. -presents for follow-up of emphysema.  She has gold stage II COPD based on April 2019 PFT.  After I saw her she had an admission for diastolic heart failure.  She then saw a nurse practitioner who reviewed a CT scan of the chest and placed her on Spiriva and Symbicort.  She tells me that she took a Spiriva and Symbicort but in the  last month because she is taking care of her dying mom she became noncompliant with these inhalers and is not taking it.  As a result her symptoms have deteriorated with increased cough congestion and shortness of breath which is rated as class III on exertion relieved by rest.  She feels prednisone will help her.  She has not had a flu shot but will take it with her primary care physician.  Review of the chart shows that she needs Pneumovax and  Prevnar vaccines.  We discussed pulmonary rehabilitation but she is not interested at this point because of her dying mom.     ROS - per HPI  OV 05/13/2020  Subjective:  Patient ID: Kimberly Waters, female , DOB: [birth year] , age 61 y.o. , MRN: [id number] , ADDRESS: [place] [address]   05/13/2020 -   Chief Complaint  Patient presents with  . Follow-up    SOB still, cannot take a deep breath, occ cough with clear mucous     HPI Kimberly Waters 74 y.o. -stage II COPD follow-up.  I personally not seen her since 2019.  She then called the office and made this appointment.  She tells me she is more short of breath than usual.  She has seen Dr. Candee Furbish in cardiology and is having a coronary artery calcium CT tomorrow.  I confirm the appointment on epic.  Her COPD CAT score itself is better but she tells me the dyspnea is worse.  Overall though she is stable without any flareups or admissions or new medical issues.  She is on Spiriva and Symbicort but ran out of one of the inhalers few months ago and one of the other inhalers a week ago.  She has no other complaints.  There is no worsening edema or proximal nocturnal dyspnea or chest pain or wheezing.  She does have instability issues and uses a walker.  Otherwise she says she falls easily.       CAT COPD Symptom & Quality of Life Score (GSK trademark) 0 is no burden. 5 is highest burden 09/17/2018   Never Cough -> Cough all the time 3  No phlegm in chest -> Chest is full of  phlegm 3  No chest tightness -> Chest feels very tight 3  No dyspnea for 1 flight stairs/hill -> Very dyspneic for 1 flight of stairs 5  No limitations for ADL at home -> Very limited with ADL at home 5  Confident leaving home -> Not at all confident leaving home 3  Sleep soundly -> Do not sleep soundly because of lung condition 3  Lots of Energy -> No energy at all 5  TOTAL Score (max 40)  30   CAT Score 05/13/2020  Total CAT Score 19   Results for ERIANNY, WAKEHAM (MRN [id number]) as of 05/13/2020 13:49  Ref. Range 05/12/2020 09:38  Creatinine Latest Ref Range: 0.57 - 1.00 mg/dL 1.24 (H)     ROS - per HPI  IMPRESSION: 1. No definitive evidence of interstitial lung disease. 2. Mild pulmonary edema and small right pleural effusion. 3. Aortic atherosclerosis (ICD10-170.0). Coronary artery calcification. 4.  Emphysema (ICD10-J43.9). 5. Hepatic steatosis. 6. Left adrenal adenoma.   Electronically Signed   By: Lorin Picket M.D.   On: 03/27/2018 08:55   has a past medical history of Allergy, Breast cancer ([place]), Colon polyps, Depression, Dyspnea, Family history of breast cancer, Family history of colon cancer, GERD (gastroesophageal reflux disease), History of radiation therapy (06/05/17-07/20/17), Hyperlipidemia, Hypertension, Hypothyroidism, Peripheral neuropathy (06/18/2019), and PONV (postoperative nausea and vomiting).   reports that she quit smoking about 3 years ago. Her smoking use included cigarettes. She has a 45.00 pack-year smoking history. She has never used smokeless tobacco.  Past Surgical History:  Procedure Laterality Date  . ABDOMINAL HYSTERECTOMY    . KNEE SURGERY Right   . MASTECTOMY W/ SENTINEL NODE BIOPSY Right 03/05/2017   Procedure: BILATERAL TOTAL MASTECTOMIES WITH RIGHT SENTINEL LYMPH NODE BIOPSIES;  Surgeon: Excell Seltzer, MD;  Location: [facility];  Service: General;  Laterality: Right;  . SHOULDER SURGERY     BILAT    Allergies  Allergen Reactions   . Adhesive [Tape] Other (See Comments)    (skin tears)  Tolerates PAPER TAPE  . Codeine Nausea Only  . Other Nausea Only    Anesthesia--nausea     Immunization History  Administered Date(s) Administered  . Fluad Quad(high Dose 65+) 10/22/2019  . Influenza Whole 12/03/2007, 12/16/2009  . Influenza-Unspecified 09/25/2015  . PFIZER SARS-COV-2 Vaccination 03/23/2020, 04/13/2020  . Pneumococcal Conjugate-13 09/17/2018  . Pneumococcal Polysaccharide-23 01/11/2010  . Td 02/28/2006    Family History  Problem Relation Age of Onset  . Heart disease Mother 59       stents  . Colon cancer Maternal Grandfather 38  . Colon cancer Maternal Aunt 32  . Colon cancer Maternal Uncle        dx in his 31s  . Breast cancer Paternal Aunt 74  . Breast cancer Cousin 65       paternal first cousin  . Lung cancer Paternal Grandfather   . Colon cancer Maternal Uncle   . Head & neck cancer Maternal Uncle        oral cancer     Current Outpatient Medications:  .  albuterol (PROVENTIL,VENTOLIN) 90 MCG/ACT inhaler, Inhale 2 puffs into the lungs every 4 (four) hours as needed., Disp: 17 g, Rfl: 3 .  aspirin EC 81 MG EC tablet, Take 1 tablet (81 mg total) by mouth daily., Disp: , Rfl:  .  budesonide-formoterol (SYMBICORT) 80-4.5 MCG/ACT inhaler, Inhale 2 puffs into the lungs 2 (two) times daily., Disp: 1 Inhaler, Rfl: 11 .  buPROPion (WELLBUTRIN SR) 150 MG 12 hr tablet, Take 150 mg by mouth daily. , Disp: , Rfl:  .  diphenhydrAMINE (BENADRYL) 25 MG tablet, Take 25 mg by mouth as needed for sleep., Disp: , Rfl:  .  escitalopram (LEXAPRO) 10 MG tablet, Take 1 tablet (10 mg total) by mouth daily., Disp: 30 tablet, Rfl: 0 .  furosemide (LASIX) 40 MG tablet, Take 1 tablet (40 mg total) by mouth daily., Disp: 30 tablet, Rfl: 0 .  hydrOXYzine (ATARAX/VISTARIL) 25 MG tablet, Take 25 mg by mouth at bedtime., Disp: , Rfl:  .  imipramine (TOFRANIL) 50 MG tablet, Take 50 mg by mouth at  bedtime., Disp: , Rfl:  .   ipratropium (ATROVENT) 0.06 % nasal spray, Place 2 sprays into the nose 4 (four) times daily., Disp: 15 mL, Rfl: 3 .  levothyroxine (SYNTHROID, LEVOTHROID) 112 MCG tablet, TAKE ONE TABLET BY MOUTH ONCE DAILY, Disp: 30 tablet, Rfl: 0 .  lisinopril-hydrochlorothiazide (ZESTORETIC) 20-25 MG tablet, Take 1 tablet by mouth daily., Disp: , Rfl:  .  omeprazole (PRILOSEC) 20 MG capsule, Take 20 mg by mouth daily., Disp: , Rfl:  .  Potassium Chloride ER 20 MEQ TBCR, Take 20 mEq by mouth daily., Disp: , Rfl:  .  potassium chloride SA (K-DUR,KLOR-CON) 20 MEQ tablet, Take 1 tablet (20 mEq total) by mouth daily., Disp: 30 tablet, Rfl: 0 .  simvastatin (ZOCOR) 20 MG tablet, Take 1 tablet (20 mg total) by mouth at bedtime., Disp: 90 tablet, Rfl: 0 .  metoprolol tartrate (LOPRESSOR) 100 MG tablet, Take 1 tablet (100 mg total) by mouth once for 1 dose., Disp: 1 tablet, Rfl: 0      Objective:   Vitals:   05/13/20 1332  BP: 122/72  Pulse: 93  Temp: (!) 97.2 F (36.2 C)  TempSrc: Temporal  SpO2: 97%  Weight: 183 lb 12.8 oz (83.4 kg)  Height: 5\' 2"  (1.575 m)    Estimated body mass index is 33.62 kg/m as calculated from the following:   Height as of this encounter: 5\' 2"  (1.575 m).   Weight as of this encounter: 183 lb 12.8 oz (83.4 kg).  @WEIGHTCHANGE @  [employer]   05/13/20 1332  Weight: 183 lb 12.8 oz (83.4 kg)     Physical Exam  Obese lady with antalgic gait.  Using a walker.  Slightly barrel chested but no pursed lip breathing or accessory muscle use.  No wheezing no crackles.  Normal heart sounds abdomen obese.  Pleasant and cheerful.       Assessment:       ICD-10-CM   1. Stage 2 moderate COPD by GOLD classification ([place])  J44.9   2. Stopped smoking with greater than 40 pack year history  Z87.891   3. Dyspnea on exertion  R06.00        Plan:     Patient Instructions     ICD-10-CM   1. Stage 2 moderate COPD by GOLD classification ([place])  J44.9   2. Stopped smoking with  greater than 40 pack year history  Z87.891   3. Dyspnea on exertion  R06.00     Symptoms may be worse after running out of inhalers of Spiriva and Symbicort  Plan -Stop Spiriva and Symbicort -Instead start BREZTRI 2 puffs 2 times daily -take 1 month sample and prescription  -If too expensive let us know -Take  albuterol as needed -Add CT chest without contrast because of dyspnea on exertion  -Add this to tomorrow's CT scan of the chest being done for coronary artery calcification  Follow-up -We will call with the results of the CT scan -4 to 6 weeks with nurse practitioner to report progress with new inhaler  -Do med review at that time     SIGNATURE    Dr. Brand Males, M.D., F.C.C.P,  Pulmonary and Critical Care Medicine Staff Physician, [facility] Director - Interstitial Lung Disease  Program  Pulmonary [facility] [place] [place], [place], [address]  Pager: [phone], If no answer or between  15:00h - 7:00h: call [phone] Telephone: [phone]  2:00  PM 05/13/2020

## 2020-05-14 ENCOUNTER — Ambulatory Visit (HOSPITAL_COMMUNITY)
Admission: RE | Admit: 2020-05-14 | Discharge: 2020-05-14 | Disposition: A | Discharge status: Home / Self Care | Payer: Medicare Other | Source: Ambulatory Visit | Attending: Cardiology | Admitting: Cardiology

## 2020-05-14 ENCOUNTER — Other Ambulatory Visit: Payer: Self-pay

## 2020-05-14 DIAGNOSIS — I2584 Coronary atherosclerosis due to calcified coronary lesion: Secondary | ICD-10-CM | POA: Insufficient documentation

## 2020-05-14 DIAGNOSIS — R0602 Shortness of breath: Secondary | ICD-10-CM | POA: Diagnosis not present

## 2020-05-14 DIAGNOSIS — R0609 Other forms of dyspnea: Secondary | ICD-10-CM

## 2020-05-14 DIAGNOSIS — I2510 Atherosclerotic heart disease of native coronary artery without angina pectoris: Secondary | ICD-10-CM | POA: Diagnosis not present

## 2020-05-14 DIAGNOSIS — R079 Chest pain, unspecified: Secondary | ICD-10-CM | POA: Diagnosis not present

## 2020-05-14 DIAGNOSIS — I700 Atherosclerosis of aorta: Secondary | ICD-10-CM | POA: Diagnosis not present

## 2020-05-14 DIAGNOSIS — J432 Centrilobular emphysema: Secondary | ICD-10-CM | POA: Diagnosis not present

## 2020-05-14 DIAGNOSIS — R0600 Dyspnea, unspecified: Secondary | ICD-10-CM | POA: Diagnosis not present

## 2020-05-14 MED ORDER — IOHEXOL 350 MG/ML SOLN
80.0000 mL | Freq: Once | INTRAVENOUS | Status: AC | PRN
  Administered 2020-05-14: 80 mL via INTRAVENOUS

## 2020-05-14 MED ORDER — NITROGLYCERIN 0.4 MG SL SUBL
0.8000 mg | SUBLINGUAL_TABLET | Freq: Once | SUBLINGUAL | Status: AC
  Administered 2020-05-14: 0.8 mg via SUBLINGUAL

## 2020-05-14 MED ORDER — NITROGLYCERIN 0.4 MG SL SUBL
SUBLINGUAL_TABLET | SUBLINGUAL | Status: AC
  Filled 2020-05-14: qty 2

## 2020-05-15 DIAGNOSIS — Z6834 Body mass index (BMI) 34.0-34.9, adult: Secondary | ICD-10-CM

## 2020-05-15 DIAGNOSIS — I2510 Atherosclerotic heart disease of native coronary artery without angina pectoris: Secondary | ICD-10-CM | POA: Diagnosis not present

## 2020-05-15 DIAGNOSIS — R079 Chest pain, unspecified: Secondary | ICD-10-CM | POA: Diagnosis not present

## 2020-05-15 DIAGNOSIS — R0600 Dyspnea, unspecified: Secondary | ICD-10-CM

## 2020-05-15 DIAGNOSIS — I700 Atherosclerosis of aorta: Secondary | ICD-10-CM | POA: Diagnosis not present

## 2020-05-15 DIAGNOSIS — I2584 Coronary atherosclerosis due to calcified coronary lesion: Secondary | ICD-10-CM

## 2020-05-15 DIAGNOSIS — R931 Abnormal findings on diagnostic imaging of heart and coronary circulation: Secondary | ICD-10-CM | POA: Diagnosis not present

## 2020-05-19 ENCOUNTER — Telehealth: Payer: Self-pay | Admitting: *Deleted

## 2020-05-19 DIAGNOSIS — I517 Cardiomegaly: Secondary | ICD-10-CM

## 2020-05-19 DIAGNOSIS — Z79899 Other long term (current) drug therapy: Secondary | ICD-10-CM

## 2020-05-19 DIAGNOSIS — E782 Mixed hyperlipidemia: Secondary | ICD-10-CM

## 2020-05-19 NOTE — Telephone Encounter (Signed)
-----   Message from Jerline Pain, MD sent at 05/17/2020  9:10 PM EDT ----- In addition, let's order a cardiac MRI to further clarify possible lipomatous hypertrophy of intraatrial septum.  Candee Furbish, MD

## 2020-05-19 NOTE — Telephone Encounter (Signed)
  Non flow limiting coronary artery disease. FFR flow analysis overall reassuring.  With this, would encourage change from simvastatin 20 to Crestor 20.  Has seen Dr. Chase Caller with pulmonary. Moderate emphysema on CT.  Recheck lipids in 3 months with ALT.  Candee Furbish, MD

## 2020-05-19 NOTE — Telephone Encounter (Signed)
I placed call to patient and left message to call office 

## 2020-05-21 NOTE — Progress Notes (Signed)
CT chest with emphhysema . Otherwise no cancer or fibrosis. Keep followup with APP to report coure with [facility]

## 2020-05-25 MED ORDER — ROSUVASTATIN CALCIUM 20 MG PO TABS
20.0000 mg | ORAL_TABLET | Freq: Every day | ORAL | 3 refills | Status: DC
Start: 2020-05-25 — End: 2021-04-14

## 2020-05-25 NOTE — Telephone Encounter (Signed)
Sent MyChart message for pt to contact the office to discuss this information.

## 2020-05-25 NOTE — Telephone Encounter (Signed)
Pt responded and is aware of the need to d/c simvastatin, start crestor 20 mg daily, repeat lipid/alt in 3 months(scheduled).  Rx sent into pharmacy of her choice.  Cardiac MRI ordered.  Pt was instructed she will be contacted to be scheduled for this once pre cert has been obtained.

## 2020-05-26 ENCOUNTER — Telehealth: Payer: Self-pay | Admitting: Cardiology

## 2020-05-26 NOTE — Telephone Encounter (Signed)
Left message for patient to call regarding the Cardiac MRI ordered by Dr. Marlou Porch

## 2020-05-26 NOTE — Progress Notes (Signed)
[place]  [phone] Fax:([phone]) [phone]     ID: Kimberly Waters DOB: 1946/09/28  MR#: [id number]  CHE#:[id number]  Patient Care Team: Josetta Huddle, MD as PCP - General (Internal Medicine) Jerline Pain, MD as PCP - Cardiology (Cardiology) Juanita Craver, MD as Consulting Physician (Gastroenterology) Supreme Rybarczyk, Virgie Dad, MD as Consulting Physician (Oncology) Excell Seltzer, MD (Inactive) as Consulting Physician (General Surgery) Gery Pray, MD as Consulting Physician (Radiation Oncology) Delice Bison, Charlestine Massed, NP as Nurse Practitioner (Hematology and Oncology) Kathrynn Ducking, MD as Consulting Physician (Neurology) Star Age, MD as Attending Physician (Neurology) OTHER MD:  CHIEF COMPLAINT: Estrogen receptor positive breast cancer; status post bilateral mastectomies.  CURRENT TREATMENT: Anastrozole   INTERVAL HISTORY: Kimberly Waters returns today for follow-up of her estrogen receptor positive breast cancer.  She restarted anastrozole at her last visit on 10/22/2019 after going off of it briefly due to neuropathy concerns.  She has no hot flashes or concerns regarding vaginal dryness issues.  Since her last visit, Dr. Chase Caller obtained a chest CT on 05/14/2020 for shortness of breath. This showed no acute process in the chest. There was moderate centrilobular emphysema and aortic atherosclerosis as well as coronary artery atherosclerosis. CT coronary fractional flow reserve fluid analysis the same day by Dr. Marlou Porch showed no significant stenosis.   REVIEW OF SYSTEMS: Git[place] has fallen several times but not injured herself.  She does not use her Rollator at home.  When she does use the Rollator she does fine.  She did get both Pfizer vaccine doses which she tolerated well.  She says wearing a mask makes it hard for her to breathe.  She also has worsening hearing problems.  She keeps a very good attitude she says.  A detailed review of systems today was  otherwise stable.    BREAST CANCER HISTORY: From the original intake note:  Kimberly Waters had an unremarkable mammogram 07/01/2012, after which she did not obtain further mammography. Sometime late 2017 she noted the right nipple was inverted. She brought this to medical attention and on 72/12/2016 she underwent bilateral mammography with tomography and right breast ultrasonography at [facility]. The breast density was category C. In the central right breast there was an area of architectural distortion associated with nipple retraction. Ultrasound confirmed a 2.2 cm irregular mass in the right breast central to the nipple. In addition there was a 2.5 cm irregular mass in the right breast superiorly. The right axilla was sonographically benign.  On 01/29/2017 she underwent biopsy of both these masses, and both showed morphologically similar invasive ductal carcinomas, grade 1 or 2, with evidence of lymphovascular invasion. Both tumors were 100% estrogen receptor positive with strong staining intensity, and 60-100% progesterone receptor positive, with strong staining intensity. The MIP-1 varied from 5-10%. Both tumors were HER-2 negative, with a signals ratio 1.12-1.39, and the number per cell 2.30-3.97.  The patient's subsequent history is as detailed below   PAST MEDICAL HISTORY: Past Medical History:  Diagnosis Date  . Allergy   . Breast cancer ([place])   . Colon polyps   . Depression   . Dyspnea    SEASONAL  . Family history of breast cancer   . Family history of colon cancer   . GERD (gastroesophageal reflux disease)   . History of radiation therapy 06/05/17-07/20/17   right chest wall 50.4 Gy in 28 fractions, right axillary nodal region 45 Gy in 25 fractions  . Hyperlipidemia   . Hypertension   . Hypothyroidism   .  Peripheral neuropathy 06/18/2019  . PONV (postoperative nausea and vomiting)     PAST SURGICAL HISTORY: Past Surgical History:  Procedure Laterality Date  . ABDOMINAL HYSTERECTOMY     . KNEE SURGERY Right   . MASTECTOMY W/ SENTINEL NODE BIOPSY Right 03/05/2017   Procedure: BILATERAL TOTAL MASTECTOMIES WITH RIGHT SENTINEL LYMPH NODE BIOPSIES;  Surgeon: Excell Seltzer, MD;  Location: [place];  Service: General;  Laterality: Right;  . SHOULDER SURGERY     BILAT    FAMILY HISTORY Family History  Problem Relation Age of Onset  . Heart disease Mother 16       stents  . Colon cancer Maternal Grandfather 82  . Colon cancer Maternal Aunt 65  . Colon cancer Maternal Uncle        dx in his 75s  . Breast cancer Paternal Aunt 72  . Breast cancer Cousin 67       paternal first cousin  . Lung cancer Paternal Grandfather   . Colon cancer Maternal Uncle   . Head & neck cancer Maternal Uncle        oral cancer   The patient's father died at age 72. Her mother died at age 2 from "old age." The patient had no brothers, 1 sister. On the mother's side a grandfather, and an uncle all had colon cancer's. On the father's side there is an aunt and a cousin with breast cancer, both diagnosed in their late 11s. There is no history of ovarian cancer in the family.   GYNECOLOGIC HISTORY:  No LMP recorded. Patient has had a hysterectomy.  menarche age 60, first live birth age 58, the patient is [place] P2. She underwent total abdominal hysterectomy with bilateral salpingo-oophorectomy remotely and took hormone replacement for approximately 6 years.    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 Her husband Kimberly [place] ("[address] Waters is retired from [employer]. Their son Kimberly Waters is an Chief Financial Officer living in [place]. Their daughter Kimberly Waters works at [employer] in [place]. The patient has 5 grandchildren. She is a Tourist information centre manager     ADVANCED DIRECTIVES: In the absence of any documentation to the contrary, the patient's spouse is their HCPOA.    HEALTH MAINTENANCE: Social History   Tobacco Use  . Smoking status:  Former Smoker    Packs/day: 1.00    Years: 45.00    Pack years: 45.00    Types: Cigarettes    Quit date: 02/22/2017    Years since quitting: 3.2  . Smokeless tobacco: Never Used  Substance Use Topics  . Alcohol use: No    Alcohol/week: 0.0 standard drinks  . Drug use: No    Colonoscopy: 03/18/2014/[facility]  PAP: Status post hysterectomy  Bone density: Never    Allergies  Allergen Reactions  . Adhesive [Tape] Other (See Comments)    (skin tears)  Tolerates PAPER TAPE  . Codeine Nausea Only  . Other Nausea Only    Anesthesia--nausea     Current Outpatient Medications  Medication Sig Dispense Refill  . albuterol (PROVENTIL,VENTOLIN) 90 MCG/ACT inhaler Inhale 2 puffs into the lungs every 4 (four) hours as needed. 17 g 3  . anastrozole (ARIMIDEX) 1 MG tablet Take 1 tablet (1 mg total) by mouth daily. 90 tablet 4  . aspirin EC 81 MG EC tablet Take 1 tablet (81 mg total) by mouth daily.    . Budeson-Glycopyrrol-Formoterol (BREZTRI AEROSPHERE) 160-9-4.8 MCG/ACT AERO Inhale 2 puffs into  the lungs 2 (two) times daily. 28 g 0  . Budeson-Glycopyrrol-Formoterol (BREZTRI AEROSPHERE) 160-9-4.8 MCG/ACT AERO Inhale 2 puffs into the lungs in the morning and at bedtime. 10.7 g 5  . buPROPion (WELLBUTRIN SR) 150 MG 12 hr tablet Take 150 mg by mouth daily.     . diphenhydrAMINE (BENADRYL) 25 MG tablet Take 25 mg by mouth as needed for sleep.    Marland Kitchen escitalopram (LEXAPRO) 10 MG tablet Take 1 tablet (10 mg total) by mouth daily. 30 tablet 0  . furosemide (LASIX) 40 MG tablet Take 1 tablet (40 mg total) by mouth daily. 30 tablet 0  . hydrOXYzine (ATARAX/VISTARIL) 25 MG tablet Take 25 mg by mouth at bedtime.    Marland Kitchen imipramine (TOFRANIL) 50 MG tablet Take 50 mg by mouth at bedtime.    Marland Kitchen ipratropium (ATROVENT) 0.06 % nasal spray Place 2 sprays into the nose 4 (four) times daily. 15 mL 3  . levothyroxine (SYNTHROID, LEVOTHROID) 112 MCG tablet TAKE ONE TABLET BY MOUTH ONCE DAILY 30 tablet 0  .  lisinopril-hydrochlorothiazide (ZESTORETIC) 20-25 MG tablet Take 1 tablet by mouth daily.    . metoprolol tartrate (LOPRESSOR) 100 MG tablet Take 1 tablet (100 mg total) by mouth once for 1 dose. 1 tablet 0  . omeprazole (PRILOSEC) 20 MG capsule Take 20 mg by mouth daily.    . Potassium Chloride ER 20 MEQ TBCR Take 20 mEq by mouth daily.    . potassium chloride SA (K-DUR,KLOR-CON) 20 MEQ tablet Take 1 tablet (20 mEq total) by mouth daily. 30 tablet 0  . rosuvastatin (CRESTOR) 20 MG tablet Take 1 tablet (20 mg total) by mouth daily. 90 tablet 3   No current facility-administered medications for this visit.    OBJECTIVE:  white woman using a Rollator  Vitals:   05/27/20 0859  BP: (!) 150/73  Pulse: 88  Resp: 18  Temp: 98.3 F (36.8 C)  SpO2: 96%   Body mass index is 34.62 kg/m.    ECOG FS:1 - Symptomatic but completely ambulatory   Sclerae unicteric, EOMs intact Wearing a mask No cervical or supraclavicular adenopathy Lungs no rales or rhonchi Heart regular rate and rhythm Abd soft, obese, nontender, positive bowel sounds MSK no focal spinal tenderness, no upper extremity lymphedema Neuro: nonfocal, well oriented, appropriate affect Breasts: Status post bilateral mastectomies.  There is no evidence of local recurrence.  Both axillae are benign.   LAB RESULTS:  CMP     Component Value Date/Time   NA 141 05/12/2020 0938   NA 137 10/16/2017 1059   K 3.8 05/12/2020 0938   K 4.1 10/16/2017 1059   CL 101 05/12/2020 0938   CO2 25 05/12/2020 0938   CO2 25 10/16/2017 1059   GLUCOSE 88 05/12/2020 0938   GLUCOSE 79 10/17/2019 1210   GLUCOSE 113 10/16/2017 1059   BUN 11 05/12/2020 0938   BUN 20.3 10/16/2017 1059   CREATININE 1.24 (H) 05/12/2020 0938   CREATININE 1.23 (H) 07/17/2019 1417   CREATININE 1.2 (H) 10/16/2017 1059   CALCIUM 9.2 05/12/2020 0938   CALCIUM 9.3 10/16/2017 1059   PROT 7.0 10/17/2019 1210   PROT 7.2 03/03/2019 1542   PROT 7.2 10/16/2017 1059   ALBUMIN  3.5 10/17/2019 1210   ALBUMIN 4.7 03/03/2019 1542   ALBUMIN 3.5 10/16/2017 1059   AST 16 10/17/2019 1210   AST 16 07/17/2019 1417   AST 17 10/16/2017 1059   ALT 20 10/17/2019 1210   ALT 23 07/17/2019 1417  ALT 25 10/16/2017 1059   ALKPHOS 82 10/17/2019 1210   ALKPHOS 48 10/16/2017 1059   BILITOT 0.4 10/17/2019 1210   BILITOT 0.4 07/17/2019 1417   BILITOT 0.40 10/16/2017 1059   GFRNONAA 43 (L) 05/12/2020 0938   GFRNONAA 44 (L) 07/17/2019 1417   GFRAA 50 (L) 05/12/2020 0938   GFRAA 51 (L) 07/17/2019 1417    INo results found for: SPEP, UPEP  Lab Results  Component Value Date   WBC 5.8 05/27/2020   NEUTROABS 4.0 05/27/2020   HGB 12.6 05/27/2020   HCT 38.3 05/27/2020   MCV 88.9 05/27/2020   PLT 300 05/27/2020      Chemistry      Component Value Date/Time   NA 141 05/12/2020 0938   NA 137 10/16/2017 1059   K 3.8 05/12/2020 0938   K 4.1 10/16/2017 1059   CL 101 05/12/2020 0938   CO2 25 05/12/2020 0938   CO2 25 10/16/2017 1059   BUN 11 05/12/2020 0938   BUN 20.3 10/16/2017 1059   CREATININE 1.24 (H) 05/12/2020 0938   CREATININE 1.23 (H) 07/17/2019 1417   CREATININE 1.2 (H) 10/16/2017 1059      Component Value Date/Time   CALCIUM 9.2 05/12/2020 0938   CALCIUM 9.3 10/16/2017 1059   ALKPHOS 82 10/17/2019 1210   ALKPHOS 48 10/16/2017 1059   AST 16 10/17/2019 1210   AST 16 07/17/2019 1417   AST 17 10/16/2017 1059   ALT 20 10/17/2019 1210   ALT 23 07/17/2019 1417   ALT 25 10/16/2017 1059   BILITOT 0.4 10/17/2019 1210   BILITOT 0.4 07/17/2019 1417   BILITOT 0.40 10/16/2017 1059       No results found for: LABCA2  No components found for: LABCA125  No results for input(s): INR in the last 168 hours.  Urinalysis No results found for: COLORURINE, APPEARANCEUR, LABSPEC, PHURINE, GLUCOSEU, HGBUR, BILIRUBINUR, KETONESUR, PROTEINUR, UROBILINOGEN, NITRITE, LEUKOCYTESUR   STUDIES: CT CHEST WO CONTRAST  Result Date: 05/14/2020 CLINICAL DATA:  Shortness of  breath. Ex-smoker. EXAM: CT CHEST WITHOUT CONTRAST TECHNIQUE: Multidetector CT imaging of the chest was performed following the standard protocol without IV contrast. COMPARISON:  04/01/2018 chest radiograph. 03/26/2018 high-resolution chest CT. FINDINGS: Cardiovascular: Aortic atherosclerosis. Normal heart size with mild lipomatous hypertrophy of the interatrial septum. Lad and left circumflex coronary artery calcification. Mediastinum/Nodes: No mediastinal or definite hilar adenopathy, given limitations of unenhanced CT. No axillary or subpectoral adenopathy. Lungs/Pleura: No pleural fluid. Moderate centrilobular emphysema. Left upper lobe scarring medially. Upper Abdomen: Moderate to marked hepatic steatosis. Normal imaged portions of the spleen, stomach, pancreas, right adrenal gland, and right kidney. 4 mm upper pole left renal collecting system calculus. Musculoskeletal: Right mastectomy.  No acute osseous abnormality. IMPRESSION: 1. No acute process in the chest. 2. Left nephrolithiasis. 3. Aortic atherosclerosis (ICD10-I70.0), coronary artery atherosclerosis and emphysema (ICD10-J43.9). Electronically Signed   By: Abigail Miyamoto M.D.   On: 05/14/2020 15:51   CT CORONARY MORPH W/CTA COR W/SCORE W/CA W/CM &/OR WO/CM  Addendum Date: 05/14/2020   ADDENDUM REPORT: 05/14/2020 16:44 CLINICAL DATA:  74 year old with known coronary calcification and chest discomfort. EXAM: Cardiac/Coronary  CTA TECHNIQUE: The patient was scanned on a [employer]. FINDINGS: A 110 kV prospective scan was triggered in the descending thoracic aorta at 111 HU's. Axial non-contrast 3 mm slices were carried out through the heart. The data set was analyzed on a dedicated work station and scored using the [place]. Gantry rotation speed was 250 msecs  and collimation was .6 mm. Beta blockade and 0.8 mg of sl NTG was given. The 3D data set was reconstructed in 5% intervals of the 67-82 % of the R-R cycle. Diastolic phases  were analyzed on a dedicated work station using MPR, MIP and VRT modes. The patient received 80 cc of contrast. Aorta: Normal size. Mild root and descending calcifications/ atherosclerosis (dense arch atherosclerosis seen on chest CT). No dissection. Aortic Valve:  Trileaflet.  No calcifications. Coronary Arteries:  Normal coronary origin.  Right dominance. RCA is a large dominant artery that gives rise to PDA and PLA. There is no plaque. Left main is a large artery that gives rise to LAD and LCX arteries. LAD is a large vessel that has proximal (25-49%) and scattered mid calcified lesions (up to 50-74% at bifurcation of large mid diagonal branch). Sending for FFR analysis. LCX is a non-dominant artery that gives rise to one large OM1 branch. There is calcified proximal plaque 0-24% non flow limiting. Other findings: Normal pulmonary vein drainage into the left atrium with accessory right middle pulmonary vein. Normal left atrial appendage without a thrombus. Normal size of the pulmonary artery. There is moderate pericardial fat diffusely. There is lipomatous hypertrophy of interatrial septum with significant fatty deposition. There is hypodense mixing artifact in right atrium and right ventricle. Please see radiology report for non cardiac findings. IMPRESSION: 1. Coronary calcium score of 131. This was 37 percentile for age and sex matched control. 2. Normal coronary origin with right dominance. 3. Normal RCA, scattered LAD calcified plaque up to 74% at bifurcation of mid diagonal (sending for FFR) and non flow limiting proximal circumflex stenosis (0-24%). 4.  Aortic atherosclerosis. 5. Lipomatous hypertrophy of interatrial septum. Given prominent nature of fatty deposition, recommend cardiac MRI for further clarification. Candee Furbish, MD [facility] Electronically Signed   By: Candee Furbish MD   On: 05/14/2020 16:44   Result Date: 05/14/2020 EXAM: OVER-READ INTERPRETATION  CT CHEST The following report is an over-read  performed by radiologist Dr. Abigail Miyamoto of [facility], [place] on 05/14/2020. This over-read does not include interpretation of cardiac or coronary anatomy or pathology. The coronary CTA interpretation by the cardiologist is attached. COMPARISON:  04/01/2018 chest radiograph in 03/26/2018 chest CT. FINDINGS: Vascular: Aortic atherosclerosis. No dissection. No central pulmonary embolism, on this non-dedicated study. Mediastinum/Nodes: No imaged thoracic adenopathy. Lungs/Pleura: No pleural fluid.  Moderate centrilobular emphysema. Upper Abdomen: Hepatic steatosis. Musculoskeletal: Right mastectomy.  No acute osseous abnormality. IMPRESSION: 1.  No acute findings in the imaged extracardiac chest. 2.  Aortic Atherosclerosis (ICD10-I70.0). 3. Hepatic steatosis. Electronically Signed: By: Abigail Miyamoto M.D. On: 05/14/2020 15:50   CT CORONARY FRACTIONAL FLOW RESERVE DATA PREP  Result Date: 05/15/2020 EXAM: FFRCT ANALYSIS CT FFR ANALYSIS CLINICAL DATA:  74 year old with abnormal CT coronary FINDINGS: FFRct analysis was performed on the original cardiac CT angiogram dataset. Diagrammatic representation of the FFRct analysis is provided in a separate PDF document in PACS. This dictation was created using the PDF document and an interactive 3D model of the results. 3D model is not available in the EMR/PACS. Normal FFR range is >0.80. 1. Left Main:  No significant stenosis. 2. LAD: No significant stenosis.  Distal LAD 0.81 3. LCX: No significant stenosis. 4. RCA: No significant stenosis. IMPRESSION: 1. CT FFR analysis didn't show any significant stenosis. Distal LAD 0.81. 2.  Continue with aggressive medical management. Electronically Signed   By: Candee Furbish MD   On: 05/15/2020 07:03  ELIGIBLE FOR AVAILABLE RESEARCH PROTOCOL: no  ASSESSMENT: 74 y.o.[place], [place]  Woman status post biopsy of 2 separate masses in the central right breast 01/29/2017, showing a clinical mT2 N0 invasive ductal carcinoma,  stage IB in the new classification: Both masses being estrogen receptor and progesterone receptor positive, HER-2 not amplified, with an MIB-1 between 5 and 10%.  (1) status post bilateral mastectomies 03/05/2017 showing  (a) on the left, no malignancy  (b) On the right, a pT3 pN0, stage IIA invasive ductal carcinoma, grade 2,  with negative margins    (c) patient opted against reconstruction  (2) Oncotype score of 6 predicts a 10 year risk of recurrence outside the breast of 5% if the patient's only systemic therapy is tamoxifen for 5 years. It also predicts no benefit from chemotherapy.  (3) adjuvant radiation 06/05/17 - 07/20/17 Right Chest Wall/ 50.4 Gy in 28 fractions Right axillaryNodal region// 45 Gy in 25 fractions Mastectomy scar boost 10 gray in 5 fractions   (4) started anastrozole May 2018--held 07/26/2019 for evaluation of neuropathy  (a) bone density at [facility] 04/24/2017 showed a T score of -0.3  (b) repeat bone density to be scheduled October 2020  (c) anastrozole resumed October 2020  (5) genetics testing through the Hereditary Gene Panel offered by [facility] found no deleterious mutations in APC, ATM, AXIN2, BARD1, BMPR1A, BRCA1, BRCA2, BRIP1, CDH1, CDKN2A (p14ARF), CDKN2A (p16INK4a), CHEK2, DICER1, EPCAM (Deletion/duplication testing only), GREM1 (promoter region deletion/duplication testing only), KIT, MEN1, MLH1, MSH2, MSH6, MUTYH, NBN, NF1, PALB2, PDGFRA, PMS2, POLD1, POLE, PTEN, RAD50, RAD51C, RAD51D, SDHB, SDHC, SDHD, SMAD4, SMARCA4. STK11, TP53, TSC1, TSC2, and VHL.  The following gene was evaluated for sequence changes only: SDHA and HOXB13 c.251G>A variant only  (a) 2 VUS were identified:  MSH6 c.3394G>C and TSC1 c.1481C>T  (6) tobacco abuse: Patient quit smoking 03/03/2017  (7) M-GUS: Multiple myeloma panel obtained 03/03/2019 shows an M protein of 0.3, IgG lambda  (a) normal IgG, IgA, and IgM levels and normal kappa/lambda ratio 10/17/2019  (b) repeat M-spike  10/17/2019 was 0.2  (8) neuropathy:  (a) labs 03/03/2019 show a hemoglobin A1c of 6.3 (prediabetes).  (b) brain MRI 07/07/2019 and lumbar MRI 08/04/2019 nondiagnostic   PLAN: Braylee is now a little over 3 years out from definitive surgery for her breast cancer with no evidence of disease recurrence.  This is very favorable.  She is tolerating anastrozole well and the plan is to continue that a total of 5 years  Her neuropathy is stable.  She is being closely followed for her pulmonary and cardiac problems and she is very appreciative of Dr. Marlou Porch and Ramaswamy's help.  She will see me again in 1 year.  She knows to call for any other issue that may develop before that visit  Total encounter time 25 minutes.Sarajane Jews C. Ariyannah Pauling, MD  05/27/20 9:14 AM Medical Oncology and Hematology [facility] [place], [place] [address] Tel. [phone]    Fax. [phone]   I, Wilburn Mylar, am acting as scribe for Dr. Virgie Dad. Stewart Sasaki.  I, Lurline Del MD, have reviewed the above documentation for accuracy and completeness, and I agree with the above.   *Total Encounter Time as defined by the Centers for Medicare and Medicaid Services includes, in addition to the face-to-face time of a patient visit (documented in the note above) non-face-to-face time: obtaining and reviewing outside history, ordering and reviewing medications, tests or procedures, care coordination (communications with other health  care professionals or caregivers) and documentation in the medical record.

## 2020-05-27 ENCOUNTER — Other Ambulatory Visit: Payer: Self-pay

## 2020-05-27 ENCOUNTER — Inpatient Hospital Stay: Payer: Medicare Other | Attending: Oncology | Admitting: Oncology

## 2020-05-27 ENCOUNTER — Inpatient Hospital Stay: Payer: Medicare Other

## 2020-05-27 VITALS — BP 150/73 | HR 88 | Temp 98.3°F | Resp 18 | Ht 62.0 in | Wt 189.3 lb

## 2020-05-27 DIAGNOSIS — G629 Polyneuropathy, unspecified: Secondary | ICD-10-CM | POA: Diagnosis not present

## 2020-05-27 DIAGNOSIS — Z86010 Personal history of colonic polyps: Secondary | ICD-10-CM | POA: Insufficient documentation

## 2020-05-27 DIAGNOSIS — E039 Hypothyroidism, unspecified: Secondary | ICD-10-CM | POA: Diagnosis not present

## 2020-05-27 DIAGNOSIS — J432 Centrilobular emphysema: Secondary | ICD-10-CM

## 2020-05-27 DIAGNOSIS — Z90710 Acquired absence of both cervix and uterus: Secondary | ICD-10-CM | POA: Insufficient documentation

## 2020-05-27 DIAGNOSIS — I10 Essential (primary) hypertension: Secondary | ICD-10-CM | POA: Diagnosis not present

## 2020-05-27 DIAGNOSIS — E785 Hyperlipidemia, unspecified: Secondary | ICD-10-CM | POA: Diagnosis not present

## 2020-05-27 DIAGNOSIS — J449 Chronic obstructive pulmonary disease, unspecified: Secondary | ICD-10-CM | POA: Insufficient documentation

## 2020-05-27 DIAGNOSIS — R7303 Prediabetes: Secondary | ICD-10-CM

## 2020-05-27 DIAGNOSIS — Z170 Estrogen receptor positive status [ER+]: Secondary | ICD-10-CM

## 2020-05-27 DIAGNOSIS — Z79811 Long term (current) use of aromatase inhibitors: Secondary | ICD-10-CM | POA: Diagnosis not present

## 2020-05-27 DIAGNOSIS — C50411 Malignant neoplasm of upper-outer quadrant of right female breast: Secondary | ICD-10-CM | POA: Diagnosis not present

## 2020-05-27 DIAGNOSIS — Z9013 Acquired absence of bilateral breasts and nipples: Secondary | ICD-10-CM | POA: Diagnosis not present

## 2020-05-27 DIAGNOSIS — I2510 Atherosclerotic heart disease of native coronary artery without angina pectoris: Secondary | ICD-10-CM | POA: Diagnosis not present

## 2020-05-27 DIAGNOSIS — E559 Vitamin D deficiency, unspecified: Secondary | ICD-10-CM | POA: Diagnosis not present

## 2020-05-27 DIAGNOSIS — Z803 Family history of malignant neoplasm of breast: Secondary | ICD-10-CM | POA: Diagnosis not present

## 2020-05-27 DIAGNOSIS — F329 Major depressive disorder, single episode, unspecified: Secondary | ICD-10-CM | POA: Diagnosis not present

## 2020-05-27 DIAGNOSIS — C50111 Malignant neoplasm of central portion of right female breast: Secondary | ICD-10-CM | POA: Diagnosis not present

## 2020-05-27 DIAGNOSIS — E8881 Metabolic syndrome: Secondary | ICD-10-CM

## 2020-05-27 DIAGNOSIS — R0789 Other chest pain: Secondary | ICD-10-CM | POA: Diagnosis not present

## 2020-05-27 DIAGNOSIS — I2584 Coronary atherosclerosis due to calcified coronary lesion: Secondary | ICD-10-CM | POA: Diagnosis not present

## 2020-05-27 DIAGNOSIS — Z90722 Acquired absence of ovaries, bilateral: Secondary | ICD-10-CM | POA: Diagnosis not present

## 2020-05-27 DIAGNOSIS — I700 Atherosclerosis of aorta: Secondary | ICD-10-CM | POA: Diagnosis not present

## 2020-05-27 DIAGNOSIS — Z800 Family history of malignant neoplasm of digestive organs: Secondary | ICD-10-CM | POA: Insufficient documentation

## 2020-05-27 DIAGNOSIS — Z79899 Other long term (current) drug therapy: Secondary | ICD-10-CM | POA: Insufficient documentation

## 2020-05-27 DIAGNOSIS — Z87891 Personal history of nicotine dependence: Secondary | ICD-10-CM | POA: Insufficient documentation

## 2020-05-27 DIAGNOSIS — Z7982 Long term (current) use of aspirin: Secondary | ICD-10-CM | POA: Diagnosis not present

## 2020-05-27 DIAGNOSIS — K760 Fatty (change of) liver, not elsewhere classified: Secondary | ICD-10-CM | POA: Insufficient documentation

## 2020-05-27 LAB — CBC WITH DIFFERENTIAL/PLATELET
Abs Immature Granulocytes: 0.02 10*3/uL (ref 0.00–0.07)
Basophils Absolute: 0.1 10*3/uL (ref 0.0–0.1)
Basophils Relative: 1 %
Eosinophils Absolute: 0.2 10*3/uL (ref 0.0–0.5)
Eosinophils Relative: 4 %
HCT: 38.3 % (ref 36.0–46.0)
Hemoglobin: 12.6 g/dL (ref 12.0–15.0)
Immature Granulocytes: 0 %
Lymphocytes Relative: 18 %
Lymphs Abs: 1 10*3/uL (ref 0.7–4.0)
MCH: 29.2 pg (ref 26.0–34.0)
MCHC: 32.9 g/dL (ref 30.0–36.0)
MCV: 88.9 fL (ref 80.0–100.0)
Monocytes Absolute: 0.5 10*3/uL (ref 0.1–1.0)
Monocytes Relative: 9 %
Neutro Abs: 4 10*3/uL (ref 1.7–7.7)
Neutrophils Relative %: 68 %
Platelets: 300 10*3/uL (ref 150–400)
RBC: 4.31 MIL/uL (ref 3.87–5.11)
RDW: 12.7 % (ref 11.5–15.5)
WBC: 5.8 10*3/uL (ref 4.0–10.5)
nRBC: 0 % (ref 0.0–0.2)

## 2020-05-27 LAB — COMPREHENSIVE METABOLIC PANEL
ALT: 22 U/L (ref 0–44)
AST: 17 U/L (ref 15–41)
Albumin: 3.5 g/dL (ref 3.5–5.0)
Alkaline Phosphatase: 73 U/L (ref 38–126)
Anion gap: 12 (ref 5–15)
BUN: 12 mg/dL (ref 8–23)
CO2: 23 mmol/L (ref 22–32)
Calcium: 9.2 mg/dL (ref 8.9–10.3)
Chloride: 106 mmol/L (ref 98–111)
Creatinine, Ser: 1.33 mg/dL — ABNORMAL HIGH (ref 0.44–1.00)
GFR calc Af Amer: 46 mL/min — ABNORMAL LOW (ref >60)
GFR calc non Af Amer: 40 mL/min — ABNORMAL LOW (ref >60)
Glucose, Bld: 107 mg/dL — ABNORMAL HIGH (ref 70–99)
Potassium: 3.6 mmol/L (ref 3.5–5.1)
Sodium: 141 mmol/L (ref 135–145)
Total Bilirubin: 0.4 mg/dL (ref 0.3–1.2)
Total Protein: 6.9 g/dL (ref 6.5–8.1)

## 2020-05-27 MED ORDER — ANASTROZOLE 1 MG PO TABS
1.0000 mg | ORAL_TABLET | Freq: Every day | ORAL | 4 refills | Status: DC
Start: 2020-05-27 — End: 2021-05-30

## 2020-05-28 ENCOUNTER — Telehealth: Payer: Self-pay | Admitting: Oncology

## 2020-05-28 LAB — KAPPA/LAMBDA LIGHT CHAINS
Kappa free light chain: 21.4 mg/L — ABNORMAL HIGH (ref 3.3–19.4)
Kappa, lambda light chain ratio: 0.8 (ref 0.26–1.65)
Lambda free light chains: 26.8 mg/L — ABNORMAL HIGH (ref 5.7–26.3)

## 2020-05-28 NOTE — Telephone Encounter (Signed)
Scheduled appts per 6/3 los. Pt confirmed appt date and time.

## 2020-05-31 ENCOUNTER — Encounter: Payer: Self-pay | Admitting: *Deleted

## 2020-05-31 ENCOUNTER — Telehealth: Payer: Self-pay | Admitting: *Deleted

## 2020-05-31 LAB — MULTIPLE MYELOMA PANEL, SERUM
Albumin SerPl Elph-Mcnc: 3.4 g/dL (ref 2.9–4.4)
Albumin/Glob SerPl: 1.2 (ref 0.7–1.7)
Alpha 1: 0.2 g/dL (ref 0.0–0.4)
Alpha2 Glob SerPl Elph-Mcnc: 1 g/dL (ref 0.4–1.0)
B-Globulin SerPl Elph-Mcnc: 1 g/dL (ref 0.7–1.3)
Gamma Glob SerPl Elph-Mcnc: 0.8 g/dL (ref 0.4–1.8)
Globulin, Total: 3 g/dL (ref 2.2–3.9)
IgA: 109 mg/dL (ref 64–422)
IgG (Immunoglobin G), Serum: 629 mg/dL (ref 586–1602)
IgM (Immunoglobulin M), Srm: 228 mg/dL — ABNORMAL HIGH (ref 26–217)
M Protein SerPl Elph-Mcnc: 0.3 g/dL — ABNORMAL HIGH
Total Protein ELP: 6.4 g/dL (ref 6.0–8.5)

## 2020-05-31 NOTE — Telephone Encounter (Signed)
Spoke with patient regarding appointment for Cardiac MRI scheduled 07/01/20 at 12:00pm---arrival time is 11:15 am ist floor admissions office---will mail information to patient and it is also in My Chart.  Patient voiced her understanding.

## 2020-06-30 ENCOUNTER — Telehealth (HOSPITAL_COMMUNITY): Payer: Self-pay | Admitting: *Deleted

## 2020-06-30 NOTE — Telephone Encounter (Signed)
Reaching out to patient to offer assistance regarding upcoming cardiac imaging study; pt verbalizes understanding of appt date/time, parking situation and where to check in, and verified current allergies; name and call back number provided for further questions should they arise ° °Nephi Savage Tai RN Navigator Cardiac Imaging °[facility] °[phone] office °[phone] cell ° °

## 2020-07-01 ENCOUNTER — Other Ambulatory Visit: Payer: Self-pay

## 2020-07-01 ENCOUNTER — Ambulatory Visit (HOSPITAL_COMMUNITY)
Admission: RE | Admit: 2020-07-01 | Discharge: 2020-07-01 | Disposition: A | Discharge status: Home / Self Care | Payer: Medicare Other | Source: Ambulatory Visit | Attending: Cardiology | Admitting: Cardiology

## 2020-07-01 DIAGNOSIS — I517 Cardiomegaly: Secondary | ICD-10-CM | POA: Diagnosis not present

## 2020-07-01 MED ORDER — GADOBUTROL 1 MMOL/ML IV SOLN
10.0000 mL | Freq: Once | INTRAVENOUS | Status: AC | PRN
  Administered 2020-07-01: 10 mL via INTRAVENOUS

## 2020-07-09 DIAGNOSIS — H40033 Anatomical narrow angle, bilateral: Secondary | ICD-10-CM | POA: Diagnosis not present

## 2020-07-09 DIAGNOSIS — H2513 Age-related nuclear cataract, bilateral: Secondary | ICD-10-CM | POA: Diagnosis not present

## 2020-07-23 DIAGNOSIS — L2084 Intrinsic (allergic) eczema: Secondary | ICD-10-CM | POA: Diagnosis not present

## 2020-08-25 ENCOUNTER — Other Ambulatory Visit: Payer: Medicare Other | Admitting: *Deleted

## 2020-08-25 ENCOUNTER — Other Ambulatory Visit: Payer: Self-pay

## 2020-08-25 DIAGNOSIS — E782 Mixed hyperlipidemia: Secondary | ICD-10-CM | POA: Diagnosis not present

## 2020-08-25 DIAGNOSIS — Z79899 Other long term (current) drug therapy: Secondary | ICD-10-CM

## 2020-08-25 LAB — ALT: ALT: 17 IU/L (ref 0–32)

## 2020-08-25 LAB — LIPID PANEL
Chol/HDL Ratio: 4.1 ratio (ref 0.0–4.4)
Cholesterol, Total: 158 mg/dL (ref 100–199)
HDL: 39 mg/dL — ABNORMAL LOW (ref >39)
LDL Chol Calc (NIH): 78 mg/dL (ref 0–99)
Triglycerides: 250 mg/dL — ABNORMAL HIGH (ref 0–149)
VLDL Cholesterol Cal: 41 mg/dL — ABNORMAL HIGH (ref 5–40)

## 2020-08-26 ENCOUNTER — Other Ambulatory Visit: Payer: Self-pay | Admitting: *Deleted

## 2020-08-26 MED ORDER — EZETIMIBE 10 MG PO TABS
10.0000 mg | ORAL_TABLET | Freq: Every day | ORAL | 3 refills | Status: DC
Start: 2020-08-26 — End: 2021-08-19

## 2020-10-07 DIAGNOSIS — M170 Bilateral primary osteoarthritis of knee: Secondary | ICD-10-CM | POA: Diagnosis not present

## 2020-10-14 DIAGNOSIS — E785 Hyperlipidemia, unspecified: Secondary | ICD-10-CM | POA: Diagnosis not present

## 2020-10-14 DIAGNOSIS — F322 Major depressive disorder, single episode, severe without psychotic features: Secondary | ICD-10-CM | POA: Diagnosis not present

## 2020-10-14 DIAGNOSIS — J441 Chronic obstructive pulmonary disease with (acute) exacerbation: Secondary | ICD-10-CM | POA: Diagnosis not present

## 2020-10-14 DIAGNOSIS — I509 Heart failure, unspecified: Secondary | ICD-10-CM | POA: Diagnosis not present

## 2020-10-14 DIAGNOSIS — C50919 Malignant neoplasm of unspecified site of unspecified female breast: Secondary | ICD-10-CM | POA: Diagnosis not present

## 2020-10-14 DIAGNOSIS — F329 Major depressive disorder, single episode, unspecified: Secondary | ICD-10-CM | POA: Diagnosis not present

## 2020-10-14 DIAGNOSIS — E039 Hypothyroidism, unspecified: Secondary | ICD-10-CM | POA: Diagnosis not present

## 2020-10-14 DIAGNOSIS — J439 Emphysema, unspecified: Secondary | ICD-10-CM | POA: Diagnosis not present

## 2020-10-14 DIAGNOSIS — M170 Bilateral primary osteoarthritis of knee: Secondary | ICD-10-CM | POA: Diagnosis not present

## 2020-10-14 DIAGNOSIS — I10 Essential (primary) hypertension: Secondary | ICD-10-CM | POA: Diagnosis not present

## 2020-10-14 DIAGNOSIS — G4700 Insomnia, unspecified: Secondary | ICD-10-CM | POA: Diagnosis not present

## 2020-10-14 DIAGNOSIS — J45998 Other asthma: Secondary | ICD-10-CM | POA: Diagnosis not present

## 2020-10-21 ENCOUNTER — Other Ambulatory Visit: Payer: Self-pay

## 2020-10-21 ENCOUNTER — Inpatient Hospital Stay: Payer: Medicare Other | Attending: Oncology | Admitting: Oncology

## 2020-10-21 ENCOUNTER — Inpatient Hospital Stay: Payer: Medicare Other

## 2020-10-21 VITALS — BP 145/73 | HR 89 | Temp 99.0°F | Resp 17 | Ht 62.0 in | Wt 188.3 lb

## 2020-10-21 DIAGNOSIS — C50111 Malignant neoplasm of central portion of right female breast: Secondary | ICD-10-CM | POA: Diagnosis not present

## 2020-10-21 DIAGNOSIS — Z79899 Other long term (current) drug therapy: Secondary | ICD-10-CM | POA: Diagnosis not present

## 2020-10-21 DIAGNOSIS — Z87891 Personal history of nicotine dependence: Secondary | ICD-10-CM | POA: Diagnosis not present

## 2020-10-21 DIAGNOSIS — Z170 Estrogen receptor positive status [ER+]: Secondary | ICD-10-CM | POA: Insufficient documentation

## 2020-10-21 DIAGNOSIS — Z885 Allergy status to narcotic agent status: Secondary | ICD-10-CM | POA: Diagnosis not present

## 2020-10-21 DIAGNOSIS — Z888 Allergy status to other drugs, medicaments and biological substances status: Secondary | ICD-10-CM | POA: Diagnosis not present

## 2020-10-21 DIAGNOSIS — G629 Polyneuropathy, unspecified: Secondary | ICD-10-CM | POA: Insufficient documentation

## 2020-10-21 DIAGNOSIS — Z884 Allergy status to anesthetic agent status: Secondary | ICD-10-CM | POA: Diagnosis not present

## 2020-10-21 DIAGNOSIS — E785 Hyperlipidemia, unspecified: Secondary | ICD-10-CM | POA: Diagnosis not present

## 2020-10-21 DIAGNOSIS — I2510 Atherosclerotic heart disease of native coronary artery without angina pectoris: Secondary | ICD-10-CM

## 2020-10-21 DIAGNOSIS — I2584 Coronary atherosclerosis due to calcified coronary lesion: Secondary | ICD-10-CM | POA: Diagnosis not present

## 2020-10-21 DIAGNOSIS — Z800 Family history of malignant neoplasm of digestive organs: Secondary | ICD-10-CM | POA: Insufficient documentation

## 2020-10-21 DIAGNOSIS — Z803 Family history of malignant neoplasm of breast: Secondary | ICD-10-CM | POA: Diagnosis not present

## 2020-10-21 DIAGNOSIS — Z801 Family history of malignant neoplasm of trachea, bronchus and lung: Secondary | ICD-10-CM | POA: Diagnosis not present

## 2020-10-21 DIAGNOSIS — Z9013 Acquired absence of bilateral breasts and nipples: Secondary | ICD-10-CM | POA: Diagnosis not present

## 2020-10-21 DIAGNOSIS — Z79811 Long term (current) use of aromatase inhibitors: Secondary | ICD-10-CM | POA: Diagnosis not present

## 2020-10-21 DIAGNOSIS — Z8249 Family history of ischemic heart disease and other diseases of the circulatory system: Secondary | ICD-10-CM | POA: Diagnosis not present

## 2020-10-21 DIAGNOSIS — D472 Monoclonal gammopathy: Secondary | ICD-10-CM | POA: Insufficient documentation

## 2020-10-21 DIAGNOSIS — C50411 Malignant neoplasm of upper-outer quadrant of right female breast: Secondary | ICD-10-CM

## 2020-10-21 DIAGNOSIS — Z8719 Personal history of other diseases of the digestive system: Secondary | ICD-10-CM | POA: Diagnosis not present

## 2020-10-21 DIAGNOSIS — Z808 Family history of malignant neoplasm of other organs or systems: Secondary | ICD-10-CM | POA: Insufficient documentation

## 2020-10-21 DIAGNOSIS — Z90722 Acquired absence of ovaries, bilateral: Secondary | ICD-10-CM | POA: Insufficient documentation

## 2020-10-21 DIAGNOSIS — R7303 Prediabetes: Secondary | ICD-10-CM

## 2020-10-21 DIAGNOSIS — E8881 Metabolic syndrome: Secondary | ICD-10-CM

## 2020-10-21 LAB — CBC WITH DIFFERENTIAL/PLATELET
Abs Immature Granulocytes: 0.02 10*3/uL (ref 0.00–0.07)
Basophils Absolute: 0.1 10*3/uL (ref 0.0–0.1)
Basophils Relative: 1 %
Eosinophils Absolute: 0.2 10*3/uL (ref 0.0–0.5)
Eosinophils Relative: 2 %
HCT: 41.1 % (ref 36.0–46.0)
Hemoglobin: 13.3 g/dL (ref 12.0–15.0)
Immature Granulocytes: 0 %
Lymphocytes Relative: 14 %
Lymphs Abs: 1.2 10*3/uL (ref 0.7–4.0)
MCH: 29 pg (ref 26.0–34.0)
MCHC: 32.4 g/dL (ref 30.0–36.0)
MCV: 89.5 fL (ref 80.0–100.0)
Monocytes Absolute: 0.5 10*3/uL (ref 0.1–1.0)
Monocytes Relative: 6 %
Neutro Abs: 6.9 10*3/uL (ref 1.7–7.7)
Neutrophils Relative %: 77 %
Platelets: 280 10*3/uL (ref 150–400)
RBC: 4.59 MIL/uL (ref 3.87–5.11)
RDW: 14.1 % (ref 11.5–15.5)
WBC: 9 10*3/uL (ref 4.0–10.5)
nRBC: 0 % (ref 0.0–0.2)

## 2020-10-21 LAB — COMPREHENSIVE METABOLIC PANEL
ALT: 23 U/L (ref 0–44)
AST: 19 U/L (ref 15–41)
Albumin: 3.8 g/dL (ref 3.5–5.0)
Alkaline Phosphatase: 73 U/L (ref 38–126)
Anion gap: 9 (ref 5–15)
BUN: 15 mg/dL (ref 8–23)
CO2: 27 mmol/L (ref 22–32)
Calcium: 9.5 mg/dL (ref 8.9–10.3)
Chloride: 107 mmol/L (ref 98–111)
Creatinine, Ser: 1.27 mg/dL — ABNORMAL HIGH (ref 0.44–1.00)
GFR, Estimated: 45 mL/min — ABNORMAL LOW (ref >60)
Glucose, Bld: 111 mg/dL — ABNORMAL HIGH (ref 70–99)
Potassium: 3.7 mmol/L (ref 3.5–5.1)
Sodium: 143 mmol/L (ref 135–145)
Total Bilirubin: 0.5 mg/dL (ref 0.3–1.2)
Total Protein: 7.2 g/dL (ref 6.5–8.1)

## 2020-10-21 MED ORDER — PEGFILGRASTIM-CBQV 6 MG/0.6ML ~~LOC~~ SOSY
PREFILLED_SYRINGE | SUBCUTANEOUS | Status: AC
  Filled 2020-10-21: qty 0.6

## 2020-10-21 MED ORDER — KETOCONAZOLE 2 % EX CREA: 1.0000 "application " | TOPICAL_CREAM | Freq: Every day | CUTANEOUS | 3 refills | Status: DC

## 2020-10-21 NOTE — Progress Notes (Signed)
[place]  Telephone:([phone]) [phone] Fax:(336) [phone]     ID: JODILYN GIESE DOB: Dec 05, 1946  MR#: [id number]  IOX#:[id number]  Patient Care Team: Josetta Huddle, MD as PCP - General (Internal Medicine) Jerline Pain, MD as PCP - Cardiology (Cardiology) Juanita Craver, MD as Consulting Physician (Gastroenterology) Latonya Knight, Virgie Dad, MD as Consulting Physician (Oncology) Excell Seltzer, MD (Inactive) as Consulting Physician (General Surgery) Gery Pray, MD as Consulting Physician (Radiation Oncology) Delice Bison, Charlestine Massed, NP as Nurse Practitioner (Hematology and Oncology) Kathrynn Ducking, MD as Consulting Physician (Neurology) Star Age, MD as Attending Physician (Neurology) OTHER MD:  CHIEF COMPLAINT: Estrogen receptor positive breast cancer; status post bilateral mastectomies; MGUS  CURRENT TREATMENT: Anastrozole   INTERVAL HISTORY: Kimberly Waters returns today for follow-up of her estrogen receptor positive breast cancer.  She continues on anastrozole with good tolerance.  She has hot flashes which she considers "normal".  Arthralgias and myalgias are not an issue.  REVIEW OF SYSTEMS: Alb[place]a continues to have significant balance problems.  She uses a Rollator which helps.  She and her husband mostly stay at home.  She does not do any cooking, although food is taken out she says.  She says the world has gone crazy and they feel safe only there.  A detailed review of systems was otherwise noncontributory  COVID 19 VACCINATION STATUS: Status post Pfizer x2 with booster 10/21/2020    BREAST CANCER HISTORY: From the original intake note:  Kimberly Waters had an unremarkable mammogram 07/01/2012, after which she did not obtain further mammography. Sometime late 2017 she noted the right nipple was inverted. She brought this to medical attention and on 01/25/2017 she underwent bilateral mammography with tomography and right breast ultrasonography at [facility]. The breast density was  category C. In the central right breast there was an area of architectural distortion associated with nipple retraction. Ultrasound confirmed a 2.2 cm irregular mass in the right breast central to the nipple. In addition there was a 2.5 cm irregular mass in the right breast superiorly. The right axilla was sonographically benign.  On 01/29/2017 she underwent biopsy of both these masses, and both showed morphologically similar invasive ductal carcinomas, grade 1 or 2, with evidence of lymphovascular invasion. Both tumors were 100% estrogen receptor positive with strong staining intensity, and 60-100% progesterone receptor positive, with strong staining intensity. The MIP-1 varied from 5-10%. Both tumors were HER-2 negative, with a signals ratio 1.12-1.39, and the number per cell 2.30-3.97.  The patient's subsequent history is as detailed below   PAST MEDICAL HISTORY: Past Medical History:  Diagnosis Date  . Allergy   . Breast cancer ([place])   . Colon polyps   . Depression   . Dyspnea    SEASONAL  . Family history of breast cancer   . Family history of colon cancer   . GERD (gastroesophageal reflux disease)   . History of radiation therapy 06/05/17-07/20/17   right chest wall 50.4 Gy in 28 fractions, right axillary nodal region 45 Gy in 25 fractions  . Hyperlipidemia   . Hypertension   . Hypothyroidism   . Peripheral neuropathy 06/18/2019  . PONV (postoperative nausea and vomiting)     PAST SURGICAL HISTORY: Past Surgical History:  Procedure Laterality Date  . ABDOMINAL HYSTERECTOMY    . KNEE SURGERY Right   . MASTECTOMY W/ SENTINEL NODE BIOPSY Right 03/05/2017   Procedure: BILATERAL TOTAL MASTECTOMIES WITH RIGHT SENTINEL LYMPH NODE BIOPSIES;  Surgeon: Excell Seltzer, MD;  Location: [place];  Service: General;  Laterality: Right;  . SHOULDER SURGERY     BILAT    FAMILY HISTORY Family History  Problem Relation Age of Onset  . Heart disease Mother 33       stents  . Colon cancer  Maternal Grandfather 56  . Colon cancer Maternal Aunt 14  . Colon cancer Maternal Uncle        dx in his 28s  . Breast cancer Paternal Aunt 60  . Breast cancer Cousin 94       paternal first cousin  . Lung cancer Paternal Grandfather   . Colon cancer Maternal Uncle   . Head & neck cancer Maternal Uncle        oral cancer   The patient's father died at age 31. Her mother died at age 62 from "old age." The patient had no brothers, 1 sister. On the mother's side a grandfather, and an uncle all had colon cancer's. On the father's side there is an aunt and a cousin with breast cancer, both diagnosed in their late 10s. There is no history of ovarian cancer in the family.   GYNECOLOGIC HISTORY:  No LMP recorded. Patient has had a hysterectomy.  menarche age 39, first live birth age 10, the patient is [place] P2. She underwent total abdominal hysterectomy with bilateral salpingo-oophorectomy remotely and took hormone replacement for approximately 6 years.    SOCIAL HISTORY: (Updated October 2021). 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 Her husband [name] ("[address] Langhorst is retired from [employer]. Their son [name] is an Chief Financial Officer living in [place]. Their daughter [name] works at [employer] in [place]. The patient has 5 grandchildren 1 of whom recently graduated from college.  She is a Tourist information centre manager     ADVANCED DIRECTIVES: In the absence of any documentation to the contrary, the patient's spouse is their HCPOA.    HEALTH MAINTENANCE: Social History   Tobacco Use  . Smoking status: Former Smoker    Packs/day: 1.00    Years: 45.00    Pack years: 45.00    Types: Cigarettes    Quit date: 02/22/2017    Years since quitting: 3.6  . Smokeless tobacco: Never Used  Vaping Use  . Vaping Use: Never used  Substance Use Topics  . Alcohol use: No    Alcohol/week: 0.0 standard drinks  . Drug use: No    Colonoscopy:  03/18/2014/[facility]  PAP: Status post hysterectomy  Bone density: Never    Allergies  Allergen Reactions  . Adhesive [Tape] Other (See Comments)    (skin tears)  Tolerates PAPER TAPE  . Codeine Nausea Only  . Other Nausea Only    Anesthesia--nausea     Current Outpatient Medications  Medication Sig Dispense Refill  . albuterol (PROVENTIL,VENTOLIN) 90 MCG/ACT inhaler Inhale 2 puffs into the lungs every 4 (four) hours as needed. 17 g 3  . anastrozole (ARIMIDEX) 1 MG tablet Take 1 tablet (1 mg total) by mouth daily. 90 tablet 4  . aspirin EC 81 MG EC tablet Take 1 tablet (81 mg total) by mouth daily.    . Budeson-Glycopyrrol-Formoterol (BREZTRI AEROSPHERE) 160-9-4.8 MCG/ACT AERO Inhale 2 puffs into the lungs 2 (two) times daily. 28 g 0  . Budeson-Glycopyrrol-Formoterol (BREZTRI AEROSPHERE) 160-9-4.8 MCG/ACT AERO Inhale 2 puffs into the lungs in the morning and at bedtime. 10.7 g 5  . buPROPion (WELLBUTRIN SR) 150 MG 12 hr tablet Take 150 mg by mouth daily.     Marland Kitchen  diphenhydrAMINE (BENADRYL) 25 MG tablet Take 25 mg by mouth as needed for sleep.    Marland Kitchen escitalopram (LEXAPRO) 10 MG tablet Take 1 tablet (10 mg total) by mouth daily. 30 tablet 0  . ezetimibe (ZETIA) 10 MG tablet Take 1 tablet (10 mg total) by mouth daily. 90 tablet 3  . furosemide (LASIX) 40 MG tablet Take 1 tablet (40 mg total) by mouth daily. 30 tablet 0  . hydrOXYzine (ATARAX/VISTARIL) 25 MG tablet Take 25 mg by mouth at bedtime.    Marland Kitchen imipramine (TOFRANIL) 50 MG tablet Take 50 mg by mouth at bedtime.    Marland Kitchen ipratropium (ATROVENT) 0.06 % nasal spray Place 2 sprays into the nose 4 (four) times daily. 15 mL 3  . levothyroxine (SYNTHROID, LEVOTHROID) 112 MCG tablet TAKE ONE TABLET BY MOUTH ONCE DAILY 30 tablet 0  . lisinopril-hydrochlorothiazide (ZESTORETIC) 20-25 MG tablet Take 1 tablet by mouth daily.    . metoprolol tartrate (LOPRESSOR) 100 MG tablet Take 1 tablet (100 mg total) by mouth once for 1 dose. 1 tablet 0  . omeprazole  (PRILOSEC) 20 MG capsule Take 20 mg by mouth daily.    . Potassium Chloride ER 20 MEQ TBCR Take 20 mEq by mouth daily.    . potassium chloride SA (K-DUR,KLOR-CON) 20 MEQ tablet Take 1 tablet (20 mEq total) by mouth daily. 30 tablet 0  . rosuvastatin (CRESTOR) 20 MG tablet Take 1 tablet (20 mg total) by mouth daily. 90 tablet 3   No current facility-administered medications for this visit.    OBJECTIVE:  white woman using a Rollator  Vitals:   10/21/20 1510  BP: (!) 145/73  Pulse: 89  Resp: 17  Temp: 99 F (37.2 C)  SpO2: 95%   Body mass index is 34.44 kg/m.    ECOG FS:1 - Symptomatic but completely ambulatory   Sclerae unicteric, EOMs intact Wearing a mask No cervical or supraclavicular adenopathy Lungs no rales or rhonchi Heart regular rate and rhythm Abd soft, nontender, positive bowel sounds MSK no focal spinal tenderness, no upper extremity lymphedema Neuro: nonfocal, well oriented, appropriate affect Breasts: Status post bilateral mastectomies.  There is no evidence of local recurrence.  Both axillae are benign. Skin: Bilateral yeast rash in both skin folds laterally  LAB RESULTS:  CMP     Component Value Date/Time   NA 141 05/27/2020 0834   NA 141 05/12/2020 0938   NA 137 10/16/2017 1059   K 3.6 05/27/2020 0834   K 4.1 10/16/2017 1059   CL 106 05/27/2020 0834   CO2 23 05/27/2020 0834   CO2 25 10/16/2017 1059   GLUCOSE 107 (H) 05/27/2020 0834   GLUCOSE 113 10/16/2017 1059   BUN 12 05/27/2020 0834   BUN 11 05/12/2020 0938   BUN 20.3 10/16/2017 1059   CREATININE 1.33 (H) 05/27/2020 0834   CREATININE 1.23 (H) 07/17/2019 1417   CREATININE 1.2 (H) 10/16/2017 1059   CALCIUM 9.2 05/27/2020 0834   CALCIUM 9.3 10/16/2017 1059   PROT 6.9 05/27/2020 0834   PROT 7.2 03/03/2019 1542   PROT 7.2 10/16/2017 1059   ALBUMIN 3.5 05/27/2020 0834   ALBUMIN 4.7 03/03/2019 1542   ALBUMIN 3.5 10/16/2017 1059   AST 17 05/27/2020 0834   AST 16 07/17/2019 1417   AST 17  10/16/2017 1059   ALT 17 08/25/2020 0833   ALT 23 07/17/2019 1417   ALT 25 10/16/2017 1059   ALKPHOS 73 05/27/2020 0834   ALKPHOS 48 10/16/2017 1059   BILITOT  0.4 05/27/2020 0834   BILITOT 0.4 07/17/2019 1417   BILITOT 0.40 10/16/2017 1059   GFRNONAA 40 (L) 05/27/2020 0834   GFRNONAA 44 (L) 07/17/2019 1417   GFRAA 46 (L) 05/27/2020 0834   GFRAA 51 (L) 07/17/2019 1417    INo results found for: SPEP, UPEP  Lab Results  Component Value Date   WBC 9.0 10/21/2020   NEUTROABS 6.9 10/21/2020   HGB 13.3 10/21/2020   HCT 41.1 10/21/2020   MCV 89.5 10/21/2020   PLT 280 10/21/2020      Chemistry      Component Value Date/Time   NA 141 05/27/2020 0834   NA 141 05/12/2020 0938   NA 137 10/16/2017 1059   K 3.6 05/27/2020 0834   K 4.1 10/16/2017 1059   CL 106 05/27/2020 0834   CO2 23 05/27/2020 0834   CO2 25 10/16/2017 1059   BUN 12 05/27/2020 0834   BUN 11 05/12/2020 0938   BUN 20.3 10/16/2017 1059   CREATININE 1.33 (H) 05/27/2020 0834   CREATININE 1.23 (H) 07/17/2019 1417   CREATININE 1.2 (H) 10/16/2017 1059      Component Value Date/Time   CALCIUM 9.2 05/27/2020 0834   CALCIUM 9.3 10/16/2017 1059   ALKPHOS 73 05/27/2020 0834   ALKPHOS 48 10/16/2017 1059   AST 17 05/27/2020 0834   AST 16 07/17/2019 1417   AST 17 10/16/2017 1059   ALT 17 08/25/2020 0833   ALT 23 07/17/2019 1417   ALT 25 10/16/2017 1059   BILITOT 0.4 05/27/2020 0834   BILITOT 0.4 07/17/2019 1417   BILITOT 0.40 10/16/2017 1059       No results found for: LABCA2  No components found for: LABCA125  No results for input(s): INR in the last 168 hours.  Urinalysis No results found for: COLORURINE, APPEARANCEUR, LABSPEC, PHURINE, GLUCOSEU, HGBUR, BILIRUBINUR, KETONESUR, PROTEINUR, UROBILINOGEN, NITRITE, LEUKOCYTESUR   STUDIES: No results found.   ELIGIBLE FOR AVAILABLE RESEARCH PROTOCOL: no  ASSESSMENT: 74 y.o.[place], [place]  Woman status post biopsy of 2 separate masses in the central  right breast 01/29/2017, showing a clinical mT2 N0 invasive ductal carcinoma, stage IB in the new classification: Both masses being estrogen receptor and progesterone receptor positive, HER-2 not amplified, with an MIB-1 between 5 and 10%.  (1) status post bilateral mastectomies 03/05/2017 showing  (a) on the left, no malignancy  (b) On the right, a pT3 pN0, stage IIA invasive ductal carcinoma, grade 2,  with negative margins    (c) patient opted against reconstruction  (2) Oncotype score of 6 predicts a 10 year risk of recurrence outside the breast of 5% if the patient's only systemic therapy is tamoxifen for 5 years. It also predicts no benefit from chemotherapy.  (3) adjuvant radiation 06/05/17 - 07/20/17 Right Chest Wall/ 50.4 Gy in 28 fractions Right axillaryNodal region// 45 Gy in 25 fractions Mastectomy scar boost 10 gray in 5 fractions   (4) started anastrozole May 2018--held 07/26/2019 for evaluation of neuropathy  (a) [facility] density at [facility] 04/24/2017 showed a T score of -0.3  (b) repeat bone density to be scheduled October 2020  (c) anastrozole resumed October 2020  (5) genetics testing through the Hereditary Gene Panel offered by [facility] found no deleterious mutations in APC, ATM, AXIN2, BARD1, BMPR1A, BRCA1, BRCA2, BRIP1, CDH1, CDKN2A (p14ARF), CDKN2A (p16INK4a), CHEK2, DICER1, EPCAM (Deletion/duplication testing only), GREM1 (promoter region deletion/duplication testing only), KIT, MEN1, MLH1, MSH2, MSH6, MUTYH, NBN, NF1, PALB2, PDGFRA, PMS2, POLD1, POLE, PTEN, RAD50, RAD51C, RAD51D,  SDHB, SDHC, SDHD, SMAD4, SMARCA4. STK11, TP53, TSC1, TSC2, and VHL.  The following gene was evaluated for sequence changes only: SDHA and HOXB13 c.251G>A variant only  (a) 2 VUS were identified:  MSH6 c.3394G>C and TSC1 c.1481C>T  (6) tobacco abuse: Patient quit smoking 03/03/2017  (7) M-GUS: Multiple myeloma panel obtained 03/03/2019 shows an M protein of 0.3, IgG lambda  (a) normal IgG, IgA, and  IgM levels and normal kappa/lambda ratio 10/17/2019  (b) repeat M-spike 10/17/2019 was 0.2  (c) repeat M spike 05/27/2020 was 0.3  (8) neuropathy:  (a) labs 03/03/2019 show a hemoglobin A1c of 6.3 (prediabetes).  (b) brain MRI 07/07/2019 and lumbar MRI 08/04/2019 nondiagnostic   PLAN: Kimberly Waters is now 3-1/2 years out from definitive surgery for her breast cancer with no evidence of disease recurrence.  This is very favorable.  She is tolerating anastrozole well and the plan is to continue that a total of 5 years.  We are also following her M spike which has been very stable.  We obtained a repeat myeloma panel today and will have those results later on this week.  I do not anticipate any significant change.  She has become very hard of hearing and we had to do quite a bit of communication by writing.  She remains at very high fall risk and we discussed that at length  Total encounter time 25 minutes.Sarajane Jews C. Zane Samson, MD  10/21/20 3:17 PM Medical Oncology and Hematology [facility] [place], [place] [address] Tel. [phone]    Fax. [phone]   I, Wilburn Mylar, am acting as scribe for Dr. Virgie Dad. Raequan Vanschaick.  I, Lurline Del MD, have reviewed the above documentation for accuracy and completeness, and I agree with the above.   *Total Encounter Time as defined by the Centers for Medicare and Medicaid Services includes, in addition to the face-to-face time of a patient visit (documented in the note above) non-face-to-face time: obtaining and reviewing outside history, ordering and reviewing medications, tests or procedures, care coordination (communications with other health care professionals or caregivers) and documentation in the medical record.

## 2020-10-22 ENCOUNTER — Encounter: Payer: Self-pay | Admitting: Oncology

## 2020-10-22 DIAGNOSIS — I10 Essential (primary) hypertension: Secondary | ICD-10-CM | POA: Diagnosis not present

## 2020-10-22 DIAGNOSIS — Z23 Encounter for immunization: Secondary | ICD-10-CM | POA: Diagnosis not present

## 2020-10-22 DIAGNOSIS — I509 Heart failure, unspecified: Secondary | ICD-10-CM | POA: Diagnosis not present

## 2020-10-22 DIAGNOSIS — G4700 Insomnia, unspecified: Secondary | ICD-10-CM | POA: Diagnosis not present

## 2020-10-22 DIAGNOSIS — K219 Gastro-esophageal reflux disease without esophagitis: Secondary | ICD-10-CM | POA: Diagnosis not present

## 2020-10-22 DIAGNOSIS — C50919 Malignant neoplasm of unspecified site of unspecified female breast: Secondary | ICD-10-CM | POA: Diagnosis not present

## 2020-10-22 DIAGNOSIS — F322 Major depressive disorder, single episode, severe without psychotic features: Secondary | ICD-10-CM | POA: Diagnosis not present

## 2020-10-22 DIAGNOSIS — E785 Hyperlipidemia, unspecified: Secondary | ICD-10-CM | POA: Diagnosis not present

## 2020-10-22 DIAGNOSIS — J439 Emphysema, unspecified: Secondary | ICD-10-CM | POA: Diagnosis not present

## 2020-10-22 DIAGNOSIS — E039 Hypothyroidism, unspecified: Secondary | ICD-10-CM | POA: Diagnosis not present

## 2020-10-22 DIAGNOSIS — M170 Bilateral primary osteoarthritis of knee: Secondary | ICD-10-CM | POA: Diagnosis not present

## 2020-10-22 LAB — KAPPA/LAMBDA LIGHT CHAINS
Kappa free light chain: 21.5 mg/L — ABNORMAL HIGH (ref 3.3–19.4)
Kappa, lambda light chain ratio: 1.03 (ref 0.26–1.65)
Lambda free light chains: 20.9 mg/L (ref 5.7–26.3)

## 2020-10-23 LAB — MULTIPLE MYELOMA PANEL, SERUM
Albumin SerPl Elph-Mcnc: 3.7 g/dL (ref 2.9–4.4)
Albumin/Glob SerPl: 1.2 (ref 0.7–1.7)
Alpha 1: 0.2 g/dL (ref 0.0–0.4)
Alpha2 Glob SerPl Elph-Mcnc: 1.1 g/dL — ABNORMAL HIGH (ref 0.4–1.0)
B-Globulin SerPl Elph-Mcnc: 0.9 g/dL (ref 0.7–1.3)
Gamma Glob SerPl Elph-Mcnc: 0.8 g/dL (ref 0.4–1.8)
Globulin, Total: 3.1 g/dL (ref 2.2–3.9)
IgA: 135 mg/dL (ref 64–422)
IgG (Immunoglobin G), Serum: 708 mg/dL (ref 586–1602)
IgM (Immunoglobulin M), Srm: 206 mg/dL (ref 26–217)
M Protein SerPl Elph-Mcnc: 0.3 g/dL — ABNORMAL HIGH
Total Protein ELP: 6.8 g/dL (ref 6.0–8.5)

## 2020-10-26 ENCOUNTER — Other Ambulatory Visit: Payer: Self-pay

## 2020-10-26 ENCOUNTER — Ambulatory Visit (INDEPENDENT_AMBULATORY_CARE_PROVIDER_SITE_OTHER): Payer: Medicare Other | Admitting: Cardiology

## 2020-10-26 ENCOUNTER — Encounter: Payer: Self-pay | Admitting: Cardiology

## 2020-10-26 ENCOUNTER — Telehealth: Payer: Self-pay

## 2020-10-26 VITALS — BP 130/70 | HR 78 | Ht 62.0 in | Wt 188.0 lb

## 2020-10-26 DIAGNOSIS — I2510 Atherosclerotic heart disease of native coronary artery without angina pectoris: Secondary | ICD-10-CM

## 2020-10-26 DIAGNOSIS — I2584 Coronary atherosclerosis due to calcified coronary lesion: Secondary | ICD-10-CM | POA: Diagnosis not present

## 2020-10-26 DIAGNOSIS — I517 Cardiomegaly: Secondary | ICD-10-CM

## 2020-10-26 DIAGNOSIS — I700 Atherosclerosis of aorta: Secondary | ICD-10-CM | POA: Diagnosis not present

## 2020-10-26 NOTE — Telephone Encounter (Signed)
This LPN spoke with pt to explain lab results per Dr Jana Hakim. Pt understands these results are stable with no significant change, and verbalizes understanding. Will repeat in 6 mos.

## 2020-10-26 NOTE — Patient Instructions (Signed)
Medication Instructions:  The current medical regimen is effective;  continue present plan and medications.  *If you need a refill on your cardiac medications before your next appointment, please call your pharmacy*  Follow-Up: At [facility], you and your health needs are our priority.  As part of our continuing mission to provide you with exceptional heart care, we have created designated Provider Care Teams.  These Care Teams include your primary Cardiologist (physician) and Advanced Practice Providers (APPs -  Physician Assistants and Nurse Practitioners) who all work together to provide you with the care you need, when you need it.  We recommend signing up for the patient portal called "MyChart".  Sign up information is provided on this After Visit Summary.  MyChart is used to connect with patients for Virtual Visits (Telemedicine).  Patients are able to view lab/test results, encounter notes, upcoming appointments, etc.  Non-urgent messages can be sent to your provider as well.   To learn more about what you can do with MyChart, go to [email or web address].    Your next appointment:   12 month(s)  The format for your next appointment:   In Person  Provider:   Mark Skains, MD   Thank you for choosing [facility]!!      

## 2020-10-26 NOTE — Progress Notes (Signed)
Cardiology Office Note:    Date:  10/26/2020   ID:  Kimberly Waters, DOB 1946/09/19, MRN [id number]  PCP:  Josetta Huddle, MD  [facility] Cardiologist:  Candee Furbish, MD  [facility] Electrophysiologist:  None   Referring MD: Josetta Huddle, MD     History of Present Illness:    Kimberly Waters is a 74 y.o. female here for follow-up of coronary artery disease.  CT scan from 05/14/2020: IMPRESSION: 1. Coronary calcium score of 131. This was 19 percentile for age and sex matched control.  2. Normal coronary origin with right dominance.  3. Normal RCA, scattered LAD calcified plaque up to 74% at bifurcation of mid diagonal (sending for FFR) and non flow limiting proximal circumflex stenosis (0-24%). 1. CT FFR analysis didn't show any significant stenosis. Distal LAD 0.81.  2.  Continue with aggressive medical management.   4.  Aortic atherosclerosis.  5. Lipomatous hypertrophy of interatrial septum. Given prominent nature of fatty deposition, recommend cardiac MRI for further clarification.  Candee Furbish, MD [facility]  07/01/2020-confirmed lipomatous hypertrophy.  She has been following with oncology secondary to breast cancer.  Previously we decided to check the coronary CT scan as above because of dyspnea on exertion that felt different.  Horrible.  Chest fullness.  Mother had 2-3 stents placed and died a little over a year ago.  She quit smoking 3 years ago with breast cancer.    Past Medical History:  Diagnosis Date   Allergy    Breast cancer ([place])    Colon polyps    Depression    Dyspnea    SEASONAL   Family history of breast cancer    Family history of colon cancer    GERD (gastroesophageal reflux disease)    History of radiation therapy 06/05/17-07/20/17   right chest wall 50.4 Gy in 28 fractions, right axillary nodal region 45 Gy in 25 fractions   Hyperlipidemia    Hypertension    Hypothyroidism    Peripheral neuropathy 06/18/2019    PONV (postoperative nausea and vomiting)     Past Surgical History:  Procedure Laterality Date   ABDOMINAL HYSTERECTOMY     KNEE SURGERY Right    MASTECTOMY W/ SENTINEL NODE BIOPSY Right 03/05/2017   Procedure: BILATERAL TOTAL MASTECTOMIES WITH RIGHT SENTINEL LYMPH NODE BIOPSIES;  Surgeon: Excell Seltzer, MD;  Location: [facility];  Service: General;  Laterality: Right;   SHOULDER SURGERY     BILAT    Current Medications: Current Meds  Medication Sig   albuterol (PROVENTIL,VENTOLIN) 90 MCG/ACT inhaler Inhale 2 puffs into the lungs every 4 (four) hours as needed.   anastrozole (ARIMIDEX) 1 MG tablet Take 1 tablet (1 mg total) by mouth daily.   aspirin EC 81 MG EC tablet Take 1 tablet (81 mg total) by mouth daily.   Budeson-Glycopyrrol-Formoterol (BREZTRI AEROSPHERE) 160-9-4.8 MCG/ACT AERO Inhale 2 puffs into the lungs 2 (two) times daily.   buPROPion (WELLBUTRIN SR) 150 MG 12 hr tablet Take 150 mg by mouth daily.    clobetasol ointment (TEMOVATE) 0.05 % as needed.   diphenhydrAMINE (BENADRYL) 25 MG tablet Take 25 mg by mouth as needed for sleep.   escitalopram (LEXAPRO) 10 MG tablet Take 1 tablet (10 mg total) by mouth daily.   ezetimibe (ZETIA) 10 MG tablet Take 1 tablet (10 mg total) by mouth daily.   furosemide (LASIX) 40 MG tablet Take 1 tablet (40 mg total) by mouth daily.   hydrOXYzine (ATARAX/VISTARIL) 25 MG tablet Take 25  mg by mouth at bedtime.   imipramine (TOFRANIL) 50 MG tablet Take 50 mg by mouth at bedtime.   ipratropium (ATROVENT) 0.06 % nasal spray Place 2 sprays into the nose 4 (four) times daily.   ketoconazole (NIZORAL) 2 % cream Apply 1 application topically daily.   levocetirizine (XYZAL) 5 MG tablet Take 5 mg by mouth as needed for allergies.   levothyroxine (SYNTHROID, LEVOTHROID) 112 MCG tablet TAKE ONE TABLET BY MOUTH ONCE DAILY   lisinopril-hydrochlorothiazide (ZESTORETIC) 20-25 MG tablet Take 1 tablet by mouth daily.   meloxicam (MOBIC)  7.5 MG tablet Take 7.5 mg by mouth at bedtime.   mometasone (ELOCON) 0.1 % ointment Apply topically as needed.   nitroGLYCERIN (NITROSTAT) 0.4 MG SL tablet as needed.   omeprazole (PRILOSEC) 20 MG capsule Take 20 mg by mouth daily.   potassium chloride SA (K-DUR,KLOR-CON) 20 MEQ tablet Take 1 tablet (20 mEq total) by mouth daily.   rosuvastatin (CRESTOR) 20 MG tablet Take 1 tablet (20 mg total) by mouth daily.   tacrolimus (PROTOPIC) 0.1 % ointment Apply topically as needed.   [DISCONTINUED] Budeson-Glycopyrrol-Formoterol (BREZTRI AEROSPHERE) 160-9-4.8 MCG/ACT AERO Inhale 2 puffs into the lungs in the morning and at bedtime.   [DISCONTINUED] metoprolol tartrate (LOPRESSOR) 100 MG tablet Take 1 tablet (100 mg total) by mouth once for 1 dose.   [DISCONTINUED] Potassium Chloride ER 20 MEQ TBCR Take 20 mEq by mouth daily.     Allergies:   Latex, Adhesive [tape], Codeine, and Other   Social History   Socioeconomic History   Marital status: Married    Spouse name: Not on file   Number of children: 2   Years of education: Not on file   Highest education level: Not on file  Occupational History   Occupation: reitred [employer]  Tobacco Use   Smoking status: Former Smoker    Packs/day: 1.00    Years: 45.00    Pack years: 45.00    Types: Cigarettes    Quit date: 02/22/2017    Years since quitting: 3.6   Smokeless tobacco: Never Used  Vaping Use   Vaping Use: Never used  Substance and Sexual Activity   Alcohol use: No    Alcohol/week: 0.0 standard drinks   Drug use: No   Sexual activity: Not on file  Other Topics Concern   Not on file  Social History Narrative   Not on file   Social Determinants of Health   Financial Resource Strain:    Difficulty of Paying Living Expenses: Not on file  Food Insecurity:    Worried About Charity fundraiser in the Last Year: Not on file   [employer] of Food in the Last Year: Not on file  Transportation Needs:      Lack of Transportation (Medical): Not on file   Lack of Transportation (Non-Medical): Not on file  Physical Activity:    Days of Exercise per Week: Not on file   Minutes of Exercise per Session: Not on file  Stress:    Feeling of Stress : Not on file  Social Connections:    Frequency of Communication with Friends and Family: Not on file   Frequency of Social Gatherings with Friends and Family: Not on file   Attends Religious Services: Not on file   Active Member of Clubs or Organizations: Not on file   Attends Archivist Meetings: Not on file   Marital Status: Not on file     Family History:  The patient's family history includes Breast cancer (age of onset: 59) in her cousin; Breast cancer (age of onset: 75) in her paternal aunt; Colon cancer in her maternal uncle and maternal uncle; Colon cancer (age of onset: 51) in her maternal grandfather; Colon cancer (age of onset: 14) in her maternal aunt; Head & neck cancer in her maternal uncle; Heart disease (age of onset: 36) in her mother; Lung cancer in her paternal grandfather.  ROS:   Please see the history of present illness.     All other systems reviewed and are negative.  EKGs/Labs/Other Studies Reviewed:    EKG:  EKG is  ordered today.  The ekg ordered today demonstrates sinus rhythm left in a branch block 78 bpm  Recent Labs: 10/21/2020: ALT 23; BUN 15; Creatinine, Ser 1.27; Hemoglobin 13.3; Platelets 280; Potassium 3.7; Sodium 143  Recent Lipid Panel    Component Value Date/Time   CHOL 158 08/25/2020 0833   TRIG 250 (H) 08/25/2020 0833   HDL 39 (L) 08/25/2020 0833   CHOLHDL 4.1 08/25/2020 0833   CHOLHDL 4.8 Ratio 12/09/2008 2139   VLDL 43 (H) 12/09/2008 2139   LDLCALC 78 08/25/2020 0833   LDLDIRECT 120 (H) 02/12/2013 1536      Physical Exam:    VS:  BP 130/70    Pulse 78    Ht 5\' 2"  (1.575 m)    Wt 188 lb (85.3 kg)    SpO2 97%    BMI 34.39 kg/m     Wt Readings from Last 3 Encounters:   10/26/20 188 lb (85.3 kg)  10/21/20 188 lb 4.8 oz (85.4 kg)  05/27/20 189 lb 4.8 oz (85.9 kg)     GEN:  Well nourished, well developed in no acute distress HEENT: Normal NECK: No JVD; No carotid bruits LYMPHATICS: No lymphadenopathy CARDIAC: RRR, no murmurs, rubs, gallops RESPIRATORY:  Clear to auscultation without rales, wheezing or rhonchi  ABDOMEN: Soft, non-tender, non-distended MUSCULOSKELETAL:  No edema; No deformity  SKIN: Warm and dry NEUROLOGIC:  Alert and oriented x 3 PSYCHIATRIC:  Normal affect   ASSESSMENT:    1. Coronary artery calcification   2. Aortic atherosclerosis ([place])   3. Atrial septal hypertrophy    PLAN:    In order of problems listed above:  Coronary artery disease -Nonflow limiting.  FFR analysis demonstrated nonflow limiting disease in the LAD.  Continuing of aggressive secondary risk factor modification.  She is on Crestor 20 and Zetia as well.  Please see inside chart for medication list.  Last LDL 78.  Hemoglobin 12.6.  Creatinine 1.33.  ALT 17.  COPD -Dr. Chase Caller has been following.  Currently stable.  Coronary artery calcification -As above.  High intensity therapy.  Aortic atherosclerosis -Continue with statin and aspirin. Zetia as well  LBBB -Stable no syncope.  Medication Adjustments/Labs and Tests Ordered: Current medicines are reviewed at length with the patient today.  Concerns regarding medicines are outlined above.  No orders of the defined types were placed in this encounter.  No orders of the defined types were placed in this encounter.   Patient Instructions  Medication Instructions:  The current medical regimen is effective;  continue present plan and medications.  *If you need a refill on your cardiac medications before your next appointment, please call your pharmacy*  Follow-Up: At [facility], you and your health needs are our priority.  As part of our continuing mission to provide you with exceptional heart  care, we have created designated Provider  Care Teams.  These Care Teams include your primary Cardiologist (physician) and Advanced Practice Providers (APPs -  Physician Assistants and Nurse Practitioners) who all work together to provide you with the care you need, when you need it.  We recommend signing up for the patient portal called "MyChart".  Sign up information is provided on this After Visit Summary.  MyChart is used to connect with patients for Virtual Visits (Telemedicine).  Patients are able to view lab/test results, encounter notes, upcoming appointments, etc.  Non-urgent messages can be sent to your provider as well.   To learn more about what you can do with MyChart, go to [email or web address].    Your next appointment:   12 month(s)  The format for your next appointment:   In Person  Provider:   Candee Furbish, MD   Thank you for choosing [facility]!!        Signed, Candee Furbish, MD  10/26/2020 4:41 PM    [place]

## 2020-10-28 NOTE — Addendum Note (Signed)
Addended by: Jeremy Johann on: 10/28/2020 07:47 AM   Modules accepted: Orders

## 2020-12-07 DIAGNOSIS — H43393 Other vitreous opacities, bilateral: Secondary | ICD-10-CM | POA: Diagnosis not present

## 2020-12-08 DIAGNOSIS — E785 Hyperlipidemia, unspecified: Secondary | ICD-10-CM | POA: Diagnosis not present

## 2020-12-08 DIAGNOSIS — M170 Bilateral primary osteoarthritis of knee: Secondary | ICD-10-CM | POA: Diagnosis not present

## 2020-12-08 DIAGNOSIS — G4700 Insomnia, unspecified: Secondary | ICD-10-CM | POA: Diagnosis not present

## 2020-12-08 DIAGNOSIS — F329 Major depressive disorder, single episode, unspecified: Secondary | ICD-10-CM | POA: Diagnosis not present

## 2020-12-08 DIAGNOSIS — J439 Emphysema, unspecified: Secondary | ICD-10-CM | POA: Diagnosis not present

## 2020-12-08 DIAGNOSIS — F322 Major depressive disorder, single episode, severe without psychotic features: Secondary | ICD-10-CM | POA: Diagnosis not present

## 2020-12-08 DIAGNOSIS — E039 Hypothyroidism, unspecified: Secondary | ICD-10-CM | POA: Diagnosis not present

## 2020-12-08 DIAGNOSIS — J441 Chronic obstructive pulmonary disease with (acute) exacerbation: Secondary | ICD-10-CM | POA: Diagnosis not present

## 2020-12-08 DIAGNOSIS — J45998 Other asthma: Secondary | ICD-10-CM | POA: Diagnosis not present

## 2020-12-08 DIAGNOSIS — I509 Heart failure, unspecified: Secondary | ICD-10-CM | POA: Diagnosis not present

## 2020-12-08 DIAGNOSIS — I10 Essential (primary) hypertension: Secondary | ICD-10-CM | POA: Diagnosis not present

## 2020-12-10 NOTE — Progress Notes (Signed)
[facility] Note  12/14/2020     CHIEF COMPLAINT Patient presents for Retina Evaluation   HISTORY OF PRESENT ILLNESS: Kimberly Waters is a 74 y.o. female who presents to the clinic today for:   HPI    Retina Evaluation    In right eye.  This started 2 weeks ago.  Duration of 2 weeks.  Context:  distance vision, mid-range vision and near vision.  Treatments tried include no treatments.          Comments    74 y/o female pt referred by Dr. Marin Comment for eval of sudden onset decreased VA OD x 2 wks.  No obvious trigger for symptoms, and vision has been stable since.  No problems reported OS, though pt reports she has had a "scar" in the back of her left eye that has caused blurry vision for many yrs.  Denies pain, FOL, floaters.  No gtts.       Last edited by Matthew Folks, COA on 12/14/2020  1:22 PM. (History)    pt is here on the referral of Dr. Marin Comment for concern of decreased vision OD, pt states she used to see Dr. Zadie Rhine, but got a scratch on her cornea from the lid spec one time when she received an injection, so she stopped going to him, pt states she went to see a dr in [place], [place] and "everything was fine", but about 3 weeks ago her right eye vision drastically decreased, pt states her left eye has not decreased since she stopped seeing Dr. Zadie Rhine  Referring physician: Marin Comment, [address], [place],  [place] [address]  HISTORICAL INFORMATION:   Selected notes from the MEDICAL RECORD NUMBER Referred by Dr. Marin Comment for eval of decreased VA OD.   CURRENT MEDICATIONS: No current outpatient medications on file. (Ophthalmic Drugs)   No current facility-administered medications for this visit. (Ophthalmic Drugs)   Current Outpatient Medications (Other)  Medication Sig  . albuterol (PROVENTIL,VENTOLIN) 90 MCG/ACT inhaler Inhale 2 puffs into the lungs every 4 (four) hours as needed.  Marland Kitchen albuterol (VENTOLIN HFA) 108 (90 Base) MCG/ACT inhaler Inhale 2  puffs into the lungs every 4 (four) hours as needed.  Marland Kitchen anastrozole (ARIMIDEX) 1 MG tablet Take 1 tablet (1 mg total) by mouth daily.  Marland Kitchen aspirin EC 81 MG EC tablet Take 1 tablet (81 mg total) by mouth daily.  . betamethasone dipropionate 3.00 % cream 1 application  . Budeson-Glycopyrrol-Formoterol (BREZTRI AEROSPHERE) 160-9-4.8 MCG/ACT AERO Inhale 2 puffs into the lungs 2 (two) times daily.  . budesonide-formoterol (SYMBICORT) 80-4.5 MCG/ACT inhaler 2 puffs  . buPROPion (WELLBUTRIN SR) 150 MG 12 hr tablet Take 150 mg by mouth daily.   . clobetasol ointment (TEMOVATE) 0.05 % as needed.  . diphenhydrAMINE (BENADRYL) 25 MG tablet Take 25 mg by mouth as needed for sleep.  Marland Kitchen escitalopram (LEXAPRO) 10 MG tablet Take 1 tablet (10 mg total) by mouth daily.  Marland Kitchen ezetimibe (ZETIA) 10 MG tablet Take 1 tablet (10 mg total) by mouth daily.  . furosemide (LASIX) 40 MG tablet Take 1 tablet (40 mg total) by mouth daily.  . hydrOXYzine (ATARAX/VISTARIL) 25 MG tablet Take 25 mg by mouth at bedtime.  Marland Kitchen imipramine (TOFRANIL) 50 MG tablet Take 50 mg by mouth at bedtime.  Marland Kitchen ipratropium (ATROVENT) 0.06 % nasal spray Place 2 sprays into the nose 4 (four) times daily.  Marland Kitchen ketoconazole (NIZORAL) 2 % cream Apply 1 application topically  daily.  . levocetirizine (XYZAL) 5 MG tablet Take 5 mg by mouth as needed for allergies.  Marland Kitchen levothyroxine (SYNTHROID, LEVOTHROID) 112 MCG tablet TAKE ONE TABLET BY MOUTH ONCE DAILY  . lisinopril-hydrochlorothiazide (ZESTORETIC) 20-25 MG tablet Take 1 tablet by mouth daily.  . meloxicam (MOBIC) 7.5 MG tablet Take 7.5 mg by mouth at bedtime.  . mometasone (ELOCON) 0.1 % ointment Apply topically as needed.  . nitroGLYCERIN (NITROSTAT) 0.4 MG SL tablet as needed.  Marland Kitchen omeprazole (PRILOSEC) 20 MG capsule Take 20 mg by mouth daily.  . Potassium Chloride ER 20 MEQ TBCR 1 tablet with food  . potassium chloride SA (K-DUR,KLOR-CON) 20 MEQ tablet Take 1 tablet (20 mEq total) by mouth daily.  .  predniSONE (DELTASONE) 10 MG tablet Take by mouth.  . rosuvastatin (CRESTOR) 20 MG tablet Take 1 tablet (20 mg total) by mouth daily.  . simvastatin (ZOCOR) 20 MG tablet Take 1 tablet by mouth every evening.  . tacrolimus (PROTOPIC) 0.1 % ointment Apply topically as needed.   No current facility-administered medications for this visit. (Other)      REVIEW OF SYSTEMS: ROS    Positive for: Gastrointestinal, Cardiovascular, Eyes, Respiratory   Negative for: Constitutional, Neurological, Skin, Genitourinary, Musculoskeletal, HENT, Endocrine, Psychiatric, Allergic/Imm, Heme/Lymph   Last edited by Matthew Folks, COA on 12/14/2020  1:18 PM. (History)       ALLERGIES Allergies  Allergen Reactions  . Latex     Other reaction(s): Other (See Comments) Tears skin.  . Adhesive [Tape] Other (See Comments)    (skin tears)  Tolerates PAPER TAPE  . Codeine Nausea Only  . Other Nausea Only    Anesthesia--nausea     PAST MEDICAL HISTORY Past Medical History:  Diagnosis Date  . Allergy   . Breast cancer ([place])   . Colon polyps   . Depression   . Dyspnea    SEASONAL  . Family history of breast cancer   . Family history of colon cancer   . GERD (gastroesophageal reflux disease)   . History of radiation therapy 06/05/17-07/20/17   right chest wall 50.4 Gy in 28 fractions, right axillary nodal region 45 Gy in 25 fractions  . Hyperlipidemia   . Hypertension   . Hypothyroidism   . Peripheral neuropathy 06/18/2019  . PONV (postoperative nausea and vomiting)    Past Surgical History:  Procedure Laterality Date  . ABDOMINAL HYSTERECTOMY    . KNEE SURGERY Right   . MASTECTOMY W/ SENTINEL NODE BIOPSY Right 03/05/2017   Procedure: BILATERAL TOTAL MASTECTOMIES WITH RIGHT SENTINEL LYMPH NODE BIOPSIES;  Surgeon: Excell Seltzer, MD;  Location: [facility];  Service: General;  Laterality: Right;  . SHOULDER SURGERY     BILAT    FAMILY HISTORY Family History  Problem Relation Age of Onset  .  Heart disease Mother 4       stents  . Colon cancer Maternal Grandfather 9  . Colon cancer Maternal Aunt 26  . Colon cancer Maternal Uncle        dx in his 74s  . Breast cancer Paternal Aunt 92  . Breast cancer Cousin 59       paternal first cousin  . Lung cancer Paternal Grandfather   . Colon cancer Maternal Uncle   . Head & neck cancer Maternal Uncle        oral cancer    SOCIAL HISTORY Social History   Tobacco Use  . Smoking status: Former Smoker    Packs/day: 1.00  Years: 45.00    Pack years: 45.00    Types: Cigarettes    Quit date: 02/22/2017    Years since quitting: 3.8  . Smokeless tobacco: Never Used  Vaping Use  . Vaping Use: Never used  Substance Use Topics  . Alcohol use: No    Alcohol/week: 0.0 standard drinks  . Drug use: No         OPHTHALMIC EXAM:  Base Eye Exam    Visual Acuity (Snellen - Linear)      Right Left   Dist cc 20/100 20/250 +   Dist ph cc NI NI   Correction: Glasses       Tonometry (Tonopen, 1:25 PM)      Right Left   Pressure 17 21       Pupils      Dark Light Shape React APD   Right 4 3 Round Brisk None   Left 4 3 Round Brisk None       Visual Fields (Counting fingers)      Left Right    Full Full       Extraocular Movement      Right Left    Full, Ortho Full, Ortho       Neuro/Psych    Oriented x3: Yes   Mood/Affect: Normal       Dilation    Both eyes: 1.0% Mydriacyl, 2.5% Phenylephrine @ 1:28 PM        Slit Lamp and Fundus Exam    Slit Lamp Exam      Right Left   Lids/Lashes Dermatochalasis - upper lid Dermatochalasis - upper lid   Conjunctiva/Sclera nasal and temporal pinguecula nasal and temporal pinguecula   Cornea 2+ Punctate epithelial erosions, irregular epi, arcus, decreased TBUT 2+ Punctate epithelial erosions, irregular epi, arcus, decreased TBUT   Anterior Chamber deep, narrow temporal angle deep, narrow temporal angle   Iris Round and moderately dilated, mild anterior bowing Round and  moderately dilated, mild anterior bowing   Lens 2-3+ Nuclear sclerosis with early brunescence, 2-3+ Cortical cataract 2-3+ Nuclear sclerosis with early brunescence, 2-3+ Cortical cataract   Vitreous Vitreous syneresis, Posterior vitreous detachment Vitreous syneresis, Posterior vitreous detachment       Fundus Exam      Right Left   Disc Pink and Sharp, Compact, mild PPA Mild Pallor, Sharp rim, +elevation   C/D Ratio 0.1 0.1   Macula Blunted foveal reflex, +CNV with pigment clumping, +heme, +edema, RPE mottling and clumping, [place] Blunted foveal reflex, large area of GA with disciform scar and subretinal fibrosis, no heme   Vessels attenuated, Tortuous attenuated, Tortuous   Periphery Attached, scattered reticular degeneration, paving stone degeneration Attached, scattered reticular degeneration, scattered paving stone degeneration        Refraction    Wearing Rx      Sphere Cylinder Axis   Right -6.50 +2.00 152   Left -5.25 +1.00 018   Age: 9m   Type: SVL       Manifest Refraction      Sphere Cylinder Axis Dist VA   Right -6.75 +1.75 150 20/100+   Left -5.25 +1.25 020 20/200-          IMAGING AND PROCEDURES  Imaging and Procedures for 12/14/2020  OCT, Retina - OU - Both Eyes       Right Eye Quality was good. Central Foveal Thickness: 495. Progression has no prior data. Findings include abnormal foveal contour, subretinal fluid, subretinal hyper-reflective material, pigment epithelial detachment,  choroidal neovascular membrane.   Left Eye Quality was good. Central Foveal Thickness: 323. Progression has no prior data. Findings include abnormal foveal contour, no IRF, no SRF, subretinal hyper-reflective material, outer retinal atrophy (large central disciform scar with ORA, Foveal notch).   Notes *Images captured and stored on drive  Diagnosis / Impression:  exu ARMD OU OD with active CNV OS with inactive disciform scar  Clinical management:  See  below  Abbreviations: NFP - Normal foveal profile. CME - cystoid macular edema. PED - pigment epithelial detachment. IRF - intraretinal fluid. SRF - subretinal fluid. EZ - ellipsoid zone. ERM - epiretinal membrane. ORA - outer retinal atrophy. ORT - outer retinal tubulation. SRHM - subretinal hyper-reflective material. IRHM - intraretinal hyper-reflective material        Intravitreal Injection, Pharmacologic Agent - OD - Right Eye       Time Out 12/14/2020. 2:20 PM. Confirmed correct patient, procedure, site, and patient consented.   Anesthesia Topical anesthesia was used. Anesthetic medications included Lidocaine 2%, Proparacaine 0.5%.   Procedure Preparation included 5% betadine to ocular surface, eyelid speculum. A supplied needle was used.   Injection:  1.25 mg Bevacizumab (AVASTIN) 1.25mg /0.49mL SOLN   NDC: 92119-417-40, Lot: [id number] , Expiration date: 01/19/2021   Route: Intravitreal, Site: Right Eye, Waste: 0 mL  Post-op Post injection exam found visual acuity of at least counting fingers. The patient tolerated the procedure well. There were no complications. The patient received written and verbal post procedure care education. Post injection medications were not given.                 ASSESSMENT/PLAN:    ICD-10-CM   1. Exudative age-related macular degeneration of right eye with active choroidal neovascularization (HCC)  H35.3211 Intravitreal Injection, Pharmacologic Agent - OD - Right Eye    Bevacizumab (AVASTIN) SOLN 1.25 mg  2. Retinal edema  H35.81 OCT, Retina - OU - Both Eyes  3. Exudative age-related macular degeneration of left eye with inactive scar ([place])  H35.3223   4. Essential hypertension  I10   5. Hypertensive retinopathy of both eyes  H35.033   6. Combined forms of age-related cataract of both eyes  H25.813     1,2. Exudative age related macular degeneration OD  - OD with active CNV  - pt reports 2 wk history of decreased vision OD  -  history of intravitreal injections OS with Dr. [address] in 2017 and earlier -- pt reports stopping visits after the development of a corneal abrasion from a lid speculum.  - The incidence pathology and anatomy of wet AMD discussed   - The ANCHOR, MARINA, CATT and VIEW trials discussed with patient.    - discussed treatment options including observation vs intravitreal anti-VEGF agents such as Avastin, Lucentis, Eylea.    - Risks of endophthalmitis and vascular occlusive events and atrophic changes discussed with patient  - OCT shows central CNV w/ overlying SRHM and SRF  - BCVA OD - 20/100 from 20/30 in 2017 (JDM)  - recommend IVA OD #1 today, 12.21.21  - pt wishes to be treated with IVA  - RBA of procedure discussed, questions answered  - informed consent obtained and signed  - see procedure note  - f/u in 4 wks -- DFE/OCT, possible injection   3. Exudative age related macular degeneration, OS  - OS with inactive disciform scar  - history of intravitreal injections OS with Dr. 01.03.22 in 2017 and earlier -- pt reports stopping visits after the development  of a corneal abrasion from a lid speculum.  - has not seen a retina specialist since 2017  - OS now with large area of central GA with overlying disciform scar  - OCT shows relatively stable disciform scar in comparison to 2017 OCT -- no IRF/SRF  - BCVA 20/250 OS - No retinal intervention recommended at this time  - monitor  4,5. Hypertensive retinopathy OU - discussed importance of tight BP control - monitor  6. Mixed Cataract OU - The symptoms of cataract, surgical options, and treatments and risks were discussed with patient. - discussed diagnosis and progression - approaching visual significance - monitor  Ophthalmic Meds Ordered this visit:  Meds ordered this encounter  Medications  . Bevacizumab (AVASTIN) SOLN 1.25 mg      Return in about 4 weeks (around 01/11/2021) for f/u exu ARMD OD, DFE, OCT.  There are no Patient  Instructions on file for this visit.   Explained the diagnoses, plan, and follow up with the patient and they expressed understanding.  Patient expressed understanding of the importance of proper follow up care.   Gardiner Sleeper, M.D., Ph.D. Diseases & Surgery of the Retina and Vitreous [facility]  I have reviewed the above documentation for accuracy and completeness, and I agree with the above. Gardiner Sleeper, M.D., Ph.D. 12/14/20 4:34 PM   Abbreviations: M myopia (nearsighted); A astigmatism; H hyperopia (farsighted); P presbyopia; Mrx spectacle prescription;  CTL contact lenses; OD right eye; OS left eye; OU both eyes  XT exotropia; ET esotropia; PEK punctate epithelial keratitis; PEE punctate epithelial erosions; DES dry eye syndrome; MGD meibomian gland dysfunction; ATs artificial tears; PFAT's preservative free artificial tears; [place] nuclear sclerotic cataract; PSC posterior subcapsular cataract; ERM epi-retinal membrane; PVD posterior vitreous detachment; RD retinal detachment; DM diabetes mellitus; DR diabetic retinopathy; NPDR non-proliferative diabetic retinopathy; PDR proliferative diabetic retinopathy; CSME clinically significant macular edema; DME diabetic macular edema; dbh dot blot hemorrhages; CWS cotton wool spot; POAG primary open angle glaucoma; C/D cup-to-disc ratio; HVF humphrey visual field; GVF goldmann visual field; OCT optical coherence tomography; IOP intraocular pressure; BRVO Branch retinal vein occlusion; CRVO central retinal vein occlusion; CRAO central retinal artery occlusion; BRAO branch retinal artery occlusion; RT retinal tear; SB scleral buckle; PPV pars plana vitrectomy; VH Vitreous hemorrhage; PRP panretinal laser photocoagulation; IVK intravitreal kenalog; VMT vitreomacular traction; MH Macular hole;  NVD neovascularization of the disc; NVE neovascularization elsewhere; AREDS age related eye disease study; ARMD age related macular  degeneration; POAG primary open angle glaucoma; EBMD epithelial/anterior basement membrane dystrophy; ACIOL anterior chamber intraocular lens; IOL intraocular lens; PCIOL posterior chamber intraocular lens; Phaco/IOL phacoemulsification with intraocular lens placement; [place] photorefractive keratectomy; LASIK laser assisted in situ keratomileusis; HTN hypertension; DM diabetes mellitus; COPD chronic obstructive pulmonary disease

## 2020-12-14 ENCOUNTER — Ambulatory Visit (INDEPENDENT_AMBULATORY_CARE_PROVIDER_SITE_OTHER): Payer: Medicare Other | Admitting: Ophthalmology

## 2020-12-14 ENCOUNTER — Encounter (INDEPENDENT_AMBULATORY_CARE_PROVIDER_SITE_OTHER): Payer: Self-pay | Admitting: Ophthalmology

## 2020-12-14 ENCOUNTER — Other Ambulatory Visit: Payer: Self-pay

## 2020-12-14 DIAGNOSIS — H353211 Exudative age-related macular degeneration, right eye, with active choroidal neovascularization: Secondary | ICD-10-CM

## 2020-12-14 DIAGNOSIS — H353223 Exudative age-related macular degeneration, left eye, with inactive scar: Secondary | ICD-10-CM | POA: Diagnosis not present

## 2020-12-14 DIAGNOSIS — H35033 Hypertensive retinopathy, bilateral: Secondary | ICD-10-CM

## 2020-12-14 DIAGNOSIS — H3581 Retinal edema: Secondary | ICD-10-CM | POA: Diagnosis not present

## 2020-12-14 DIAGNOSIS — H25813 Combined forms of age-related cataract, bilateral: Secondary | ICD-10-CM

## 2020-12-14 DIAGNOSIS — I10 Essential (primary) hypertension: Secondary | ICD-10-CM | POA: Diagnosis not present

## 2020-12-14 MED ORDER — BEVACIZUMAB CHEMO INJECTION 1.25MG/0.05ML SYRINGE FOR KALEIDOSCOPE
1.2500 mg | INTRAVITREAL | Status: AC | PRN
  Administered 2020-12-14: 1.25 mg via INTRAVITREAL

## 2020-12-21 DIAGNOSIS — L2084 Intrinsic (allergic) eczema: Secondary | ICD-10-CM | POA: Diagnosis not present

## 2020-12-21 DIAGNOSIS — L720 Epidermal cyst: Secondary | ICD-10-CM | POA: Diagnosis not present

## 2021-01-11 ENCOUNTER — Encounter (INDEPENDENT_AMBULATORY_CARE_PROVIDER_SITE_OTHER): Payer: Medicare Other | Admitting: Ophthalmology

## 2021-01-11 NOTE — Progress Notes (Shared)
[facility] Note  01/11/2021     CHIEF COMPLAINT Patient presents for No chief complaint on file.   HISTORY OF PRESENT ILLNESS: Kimberly Waters is a 75 y.o. female who presents to the clinic today for:    Referring physician: Josetta Huddle, MD [address] ,  [place] [address]  HISTORICAL INFORMATION:   Selected notes from the MEDICAL RECORD NUMBER Referred by Dr. Marin Comment for eval of decreased VA OD.   CURRENT MEDICATIONS: No current outpatient medications on file. (Ophthalmic Drugs)   No current facility-administered medications for this visit. (Ophthalmic Drugs)   Current Outpatient Medications (Other)  Medication Sig  . albuterol (PROVENTIL,VENTOLIN) 90 MCG/ACT inhaler Inhale 2 puffs into the lungs every 4 (four) hours as needed.  Marland Kitchen albuterol (VENTOLIN HFA) 108 (90 Base) MCG/ACT inhaler Inhale 2 puffs into the lungs every 4 (four) hours as needed.  Marland Kitchen anastrozole (ARIMIDEX) 1 MG tablet Take 1 tablet (1 mg total) by mouth daily.  Marland Kitchen aspirin EC 81 MG EC tablet Take 1 tablet (81 mg total) by mouth daily.  . betamethasone dipropionate 4.00 % cream 1 application  . Budeson-Glycopyrrol-Formoterol (BREZTRI AEROSPHERE) 160-9-4.8 MCG/ACT AERO Inhale 2 puffs into the lungs 2 (two) times daily.  . budesonide-formoterol (SYMBICORT) 80-4.5 MCG/ACT inhaler 2 puffs  . buPROPion (WELLBUTRIN SR) 150 MG 12 hr tablet Take 150 mg by mouth daily.   . clobetasol ointment (TEMOVATE) 0.05 % as needed.  . diphenhydrAMINE (BENADRYL) 25 MG tablet Take 25 mg by mouth as needed for sleep.  Marland Kitchen escitalopram (LEXAPRO) 10 MG tablet Take 1 tablet (10 mg total) by mouth daily.  Marland Kitchen ezetimibe (ZETIA) 10 MG tablet Take 1 tablet (10 mg total) by mouth daily.  . furosemide (LASIX) 40 MG tablet Take 1 tablet (40 mg total) by mouth daily.  . hydrOXYzine (ATARAX/VISTARIL) 25 MG tablet Take 25 mg by mouth at bedtime.  Marland Kitchen imipramine (TOFRANIL) 50 MG tablet Take 50 mg by mouth at  bedtime.  Marland Kitchen ipratropium (ATROVENT) 0.06 % nasal spray Place 2 sprays into the nose 4 (four) times daily.  Marland Kitchen ketoconazole (NIZORAL) 2 % cream Apply 1 application topically daily.  Marland Kitchen levocetirizine (XYZAL) 5 MG tablet Take 5 mg by mouth as needed for allergies.  Marland Kitchen levothyroxine (SYNTHROID, LEVOTHROID) 112 MCG tablet TAKE ONE TABLET BY MOUTH ONCE DAILY  . lisinopril-hydrochlorothiazide (ZESTORETIC) 20-25 MG tablet Take 1 tablet by mouth daily.  . meloxicam (MOBIC) 7.5 MG tablet Take 7.5 mg by mouth at bedtime.  . mometasone (ELOCON) 0.1 % ointment Apply topically as needed.  . nitroGLYCERIN (NITROSTAT) 0.4 MG SL tablet as needed.  Marland Kitchen omeprazole (PRILOSEC) 20 MG capsule Take 20 mg by mouth daily.  . Potassium Chloride ER 20 MEQ TBCR 1 tablet with food  . potassium chloride SA (K-DUR,KLOR-CON) 20 MEQ tablet Take 1 tablet (20 mEq total) by mouth daily.  . predniSONE (DELTASONE) 10 MG tablet Take by mouth.  . rosuvastatin (CRESTOR) 20 MG tablet Take 1 tablet (20 mg total) by mouth daily.  . simvastatin (ZOCOR) 20 MG tablet Take 1 tablet by mouth every evening.  . tacrolimus (PROTOPIC) 0.1 % ointment Apply topically as needed.   No current facility-administered medications for this visit. (Other)      REVIEW OF SYSTEMS:    ALLERGIES Allergies  Allergen Reactions  . Latex     Other reaction(s): Other (See Comments) Tears skin.  . Adhesive [Tape] Other (See Comments)    (  skin tears)  Tolerates PAPER TAPE  . Codeine Nausea Only  . Other Nausea Only    Anesthesia--nausea     PAST MEDICAL HISTORY Past Medical History:  Diagnosis Date  . Allergy   . Breast cancer ([place])   . Colon polyps   . Depression   . Dyspnea    SEASONAL  . Family history of breast cancer   . Family history of colon cancer   . GERD (gastroesophageal reflux disease)   . History of radiation therapy 06/05/17-07/20/17   right chest wall 50.4 Gy in 28 fractions, right axillary nodal region 45 Gy in 25 fractions   . Hyperlipidemia   . Hypertension   . Hypothyroidism   . Peripheral neuropathy 06/18/2019  . PONV (postoperative nausea and vomiting)    Past Surgical History:  Procedure Laterality Date  . ABDOMINAL HYSTERECTOMY    . KNEE SURGERY Right   . MASTECTOMY W/ SENTINEL NODE BIOPSY Right 03/05/2017   Procedure: BILATERAL TOTAL MASTECTOMIES WITH RIGHT SENTINEL LYMPH NODE BIOPSIES;  Surgeon: Excell Seltzer, MD;  Location: [facility];  Service: General;  Laterality: Right;  . SHOULDER SURGERY     BILAT    FAMILY HISTORY Family History  Problem Relation Age of Onset  . Heart disease Mother 15       stents  . Colon cancer Maternal Grandfather 98  . Colon cancer Maternal Aunt 77  . Colon cancer Maternal Uncle        dx in his 39s  . Breast cancer Paternal Aunt 83  . Breast cancer Cousin 72       paternal first cousin  . Lung cancer Paternal Grandfather   . Colon cancer Maternal Uncle   . Head & neck cancer Maternal Uncle        oral cancer    SOCIAL HISTORY Social History   Tobacco Use  . Smoking status: Former Smoker    Packs/day: 1.00    Years: 45.00    Pack years: 45.00    Types: Cigarettes    Quit date: 02/22/2017    Years since quitting: 3.8  . Smokeless tobacco: Never Used  Vaping Use  . Vaping Use: Never used  Substance Use Topics  . Alcohol use: No    Alcohol/week: 0.0 standard drinks  . Drug use: No         OPHTHALMIC EXAM:  Not recorded     IMAGING AND PROCEDURES  Imaging and Procedures for 01/11/2021           ASSESSMENT/PLAN:  No diagnosis found.  1,2. Exudative age related macular degeneration OD  - OD with active CNV  - pt reports 2 wk history of decreased vision OD  - history of intravitreal injections OS with Dr. Zadie Rhine in 2017 and earlier -- pt reports stopping visits after the development of a corneal abrasion from a lid speculum.  - The incidence pathology and anatomy of wet AMD discussed   - The ANCHOR, MARINA, CATT and VIEW trials  discussed with patient.    - discussed treatment options including observation vs intravitreal anti-VEGF agents such as Avastin, Lucentis, Eylea.    - Risks of endophthalmitis and vascular occlusive events and atrophic changes discussed with patient  - OCT shows central CNV w/ overlying SRHM and SRF  - BCVA OD - 20/100 from 20/30 in 2017 (JDM)  - s/p IVA OD #1 (12.21.21)             - recommend IVA OD #2 today,  1.18.22  - pt wishes to be treated with IVA  - RBA of procedure discussed, questions answered  - informed consent obtained and signed  - see procedure note  - f/u in 4 wks -- DFE/OCT, possible injection   3. Exudative age related macular degeneration, OS  - OS with inactive disciform scar  - history of intravitreal injections OS with Dr. Zadie Rhine in 2017 and earlier -- pt reports stopping visits after the development of a corneal abrasion from a lid speculum.  - has not seen a retina specialist since 2017  - OS now with large area of central GA with overlying disciform scar  - OCT shows relatively stable disciform scar in comparison to 2017 OCT -- no IRF/SRF  - BCVA 20/250 OS - No retinal intervention recommended at this time  - monitor  4,5. Hypertensive retinopathy OU - discussed importance of tight BP control - monitor  6. Mixed Cataract OU - The symptoms of cataract, surgical options, and treatments and risks were discussed with patient. - discussed diagnosis and progression - approaching visual significance - monitor  Ophthalmic Meds Ordered this visit:  No orders of the defined types were placed in this encounter.     No follow-ups on file.  There are no Patient Instructions on file for this visit.  This document serves as a record of services personally performed by Gardiner Sleeper, MD, PhD. It was created on their behalf by Estill Bakes, COT an ophthalmic technician. The creation of this record is the provider's dictation and/or activities during the visit.     Electronically signed by: Estill Bakes, COT 1.18.22 @ 9:36 AM  Abbreviations: M myopia (nearsighted); A astigmatism; H hyperopia (farsighted); P presbyopia; Mrx spectacle prescription;  CTL contact lenses; OD right eye; OS left eye; OU both eyes  XT exotropia; ET esotropia; PEK punctate epithelial keratitis; PEE punctate epithelial erosions; DES dry eye syndrome; MGD meibomian gland dysfunction; ATs artificial tears; PFAT's preservative free artificial tears; [place] nuclear sclerotic cataract; PSC posterior subcapsular cataract; ERM epi-retinal membrane; PVD posterior vitreous detachment; RD retinal detachment; DM diabetes mellitus; DR diabetic retinopathy; NPDR non-proliferative diabetic retinopathy; PDR proliferative diabetic retinopathy; CSME clinically significant macular edema; DME diabetic macular edema; dbh dot blot hemorrhages; CWS cotton wool spot; POAG primary open angle glaucoma; C/D cup-to-disc ratio; HVF humphrey visual field; GVF goldmann visual field; OCT optical coherence tomography; IOP intraocular pressure; BRVO Branch retinal vein occlusion; CRVO central retinal vein occlusion; CRAO central retinal artery occlusion; BRAO branch retinal artery occlusion; RT retinal tear; SB scleral buckle; PPV pars plana vitrectomy; VH Vitreous hemorrhage; PRP panretinal laser photocoagulation; IVK intravitreal kenalog; VMT vitreomacular traction; MH Macular hole;  NVD neovascularization of the disc; NVE neovascularization elsewhere; AREDS age related eye disease study; ARMD age related macular degeneration; POAG primary open angle glaucoma; EBMD epithelial/anterior basement membrane dystrophy; ACIOL anterior chamber intraocular lens; IOL intraocular lens; PCIOL posterior chamber intraocular lens; Phaco/IOL phacoemulsification with intraocular lens placement; [place] photorefractive keratectomy; LASIK laser assisted in situ keratomileusis; HTN hypertension; DM diabetes mellitus; COPD chronic obstructive pulmonary  disease

## 2021-01-14 NOTE — Progress Notes (Signed)
[facility] Note  01/17/2021     CHIEF COMPLAINT Patient presents for Retina Follow Up   HISTORY OF PRESENT ILLNESS: Kimberly Waters is a 75 y.o. female who presents to the clinic today for:   HPI    Retina Follow Up    Patient presents with  Wet AMD.  In both eyes.  Since onset it is gradually improving.  I, the attending physician,  performed the HPI with the patient and updated documentation appropriately.          Comments    5 week follow up Exu ARMD OU- Patient thinks vision is better since injection OD.         Last edited by Bernarda Caffey, MD on 01/17/2021 10:58 AM. (History)    pt reports improvement in vision OU    Referring physician: Josetta Huddle, MD [address] [place],  [place] [address]  HISTORICAL INFORMATION:   Selected notes from the MEDICAL RECORD NUMBER Referred by Dr. Marin Comment for eval of decreased VA OD.   CURRENT MEDICATIONS: No current outpatient medications on file. (Ophthalmic Drugs)   No current facility-administered medications for this visit. (Ophthalmic Drugs)   Current Outpatient Medications (Other)  Medication Sig  . albuterol (PROVENTIL,VENTOLIN) 90 MCG/ACT inhaler Inhale 2 puffs into the lungs every 4 (four) hours as needed.  Marland Kitchen albuterol (VENTOLIN HFA) 108 (90 Base) MCG/ACT inhaler Inhale 2 puffs into the lungs every 4 (four) hours as needed.  Marland Kitchen anastrozole (ARIMIDEX) 1 MG tablet Take 1 tablet (1 mg total) by mouth daily.  Marland Kitchen aspirin EC 81 MG EC tablet Take 1 tablet (81 mg total) by mouth daily.  . betamethasone dipropionate [id number] % cream 1 application  . Budeson-Glycopyrrol-Formoterol (BREZTRI AEROSPHERE) 160-9-4.8 MCG/ACT AERO Inhale 2 puffs into the lungs 2 (two) times daily.  . budesonide-formoterol (SYMBICORT) 80-4.5 MCG/ACT inhaler 2 puffs  . buPROPion (WELLBUTRIN SR) 150 MG 12 hr tablet Take 150 mg by mouth daily.   . clobetasol ointment (TEMOVATE) 0.05 % as needed.  . diphenhydrAMINE  (BENADRYL) 25 MG tablet Take 25 mg by mouth as needed for sleep.  Marland Kitchen escitalopram (LEXAPRO) 10 MG tablet Take 1 tablet (10 mg total) by mouth daily.  Marland Kitchen ezetimibe (ZETIA) 10 MG tablet Take 1 tablet (10 mg total) by mouth daily.  . furosemide (LASIX) 40 MG tablet Take 1 tablet (40 mg total) by mouth daily.  . hydrOXYzine (ATARAX/VISTARIL) 25 MG tablet Take 25 mg by mouth at bedtime.  Marland Kitchen imipramine (TOFRANIL) 50 MG tablet Take 50 mg by mouth at bedtime.  Marland Kitchen ipratropium (ATROVENT) 0.06 % nasal spray Place 2 sprays into the nose 4 (four) times daily.  Marland Kitchen ketoconazole (NIZORAL) 2 % cream Apply 1 application topically daily.  Marland Kitchen levocetirizine (XYZAL) 5 MG tablet Take 5 mg by mouth as needed for allergies.  Marland Kitchen levothyroxine (SYNTHROID, LEVOTHROID) 112 MCG tablet TAKE ONE TABLET BY MOUTH ONCE DAILY  . lisinopril-hydrochlorothiazide (ZESTORETIC) 20-25 MG tablet Take 1 tablet by mouth daily.  . meloxicam (MOBIC) 7.5 MG tablet Take 7.5 mg by mouth at bedtime.  . mometasone (ELOCON) 0.1 % ointment Apply topically as needed.  . nitroGLYCERIN (NITROSTAT) 0.4 MG SL tablet as needed.  Marland Kitchen omeprazole (PRILOSEC) 20 MG capsule Take 20 mg by mouth daily.  . Potassium Chloride ER 20 MEQ TBCR 1 tablet with food  . potassium chloride SA (K-DUR,KLOR-CON) 20 MEQ tablet Take 1 tablet (20 mEq total) by mouth daily.  Marland Kitchen  rosuvastatin (CRESTOR) 20 MG tablet Take 1 tablet (20 mg total) by mouth daily.  . simvastatin (ZOCOR) 20 MG tablet Take 1 tablet by mouth every evening.  . tacrolimus (PROTOPIC) 0.1 % ointment Apply topically as needed.  . predniSONE (DELTASONE) 10 MG tablet Take by mouth. (Patient not taking: Reported on 01/17/2021)   No current facility-administered medications for this visit. (Other)      REVIEW OF SYSTEMS: ROS    Positive for: Gastrointestinal, Endocrine, Cardiovascular, Eyes, Respiratory, Psychiatric   Negative for: Constitutional, Neurological, Skin, Genitourinary, Musculoskeletal, HENT, Allergic/Imm,  Heme/Lymph   Last edited by Leonie Douglas, COA on 01/17/2021  9:58 AM. (History)       ALLERGIES Allergies  Allergen Reactions  . Latex     Other reaction(s): Other (See Comments) Tears skin.  . Adhesive [Tape] Other (See Comments)    (skin tears)  Tolerates PAPER TAPE  . Codeine Nausea Only  . Other Nausea Only    Anesthesia--nausea     PAST MEDICAL HISTORY Past Medical History:  Diagnosis Date  . Allergy   . Breast cancer ([place])   . Colon polyps   . Depression   . Dyspnea    SEASONAL  . Family history of breast cancer   . Family history of colon cancer   . GERD (gastroesophageal reflux disease)   . History of radiation therapy 06/05/17-07/20/17   right chest wall 50.4 Gy in 28 fractions, right axillary nodal region 45 Gy in 25 fractions  . Hyperlipidemia   . Hypertension   . Hypothyroidism   . Peripheral neuropathy 06/18/2019  . PONV (postoperative nausea and vomiting)    Past Surgical History:  Procedure Laterality Date  . ABDOMINAL HYSTERECTOMY    . KNEE SURGERY Right   . MASTECTOMY W/ SENTINEL NODE BIOPSY Right 03/05/2017   Procedure: BILATERAL TOTAL MASTECTOMIES WITH RIGHT SENTINEL LYMPH NODE BIOPSIES;  Surgeon: Excell Seltzer, MD;  Location: [facility];  Service: General;  Laterality: Right;  . SHOULDER SURGERY     BILAT    FAMILY HISTORY Family History  Problem Relation Age of Onset  . Heart disease Mother 71       stents  . Colon cancer Maternal Grandfather 28  . Colon cancer Maternal Aunt 96  . Colon cancer Maternal Uncle        dx in his 83s  . Breast cancer Paternal Aunt 62  . Breast cancer Cousin 75       paternal first cousin  . Lung cancer Paternal Grandfather   . Colon cancer Maternal Uncle   . Head & neck cancer Maternal Uncle        oral cancer    SOCIAL HISTORY Social History   Tobacco Use  . Smoking status: Former Smoker    Packs/day: 1.00    Years: 45.00    Pack years: 45.00    Types: Cigarettes    Quit date: 02/22/2017     Years since quitting: 3.9  . Smokeless tobacco: Never Used  Vaping Use  . Vaping Use: Never used  Substance Use Topics  . Alcohol use: No    Alcohol/week: 0.0 standard drinks  . Drug use: No         OPHTHALMIC EXAM:  Base Eye Exam    Visual Acuity (Snellen - Linear)      Right Left   Dist cc 20/40 +2 CF3' to the left   Dist ph cc NI NI       Tonometry (Tonopen, 10:04 AM)  Right Left   Pressure 18 22       Pupils      Dark Light Shape React APD   Right 4 3 Round Brisk None   Left 4 3 Round Brisk None       Visual Fields (Counting fingers)      Left Right    Full Full       Extraocular Movement      Right Left    Full Full       Neuro/Psych    Oriented x3: Yes   Mood/Affect: Normal       Dilation    Both eyes: 1.0% Mydriacyl, 2.5% Phenylephrine @ 10:05 AM        Slit Lamp and Fundus Exam    Slit Lamp Exam      Right Left   Lids/Lashes Dermatochalasis - upper lid Dermatochalasis - upper lid   Conjunctiva/Sclera nasal and temporal pinguecula nasal and temporal pinguecula   Cornea 2+ Punctate epithelial erosions, irregular epi, arcus, decreased TBUT 2+ Punctate epithelial erosions, irregular epi, arcus, decreased TBUT   Anterior Chamber deep, narrow temporal angle deep, narrow temporal angle   Iris Round and moderately dilated, mild anterior bowing Round and moderately dilated, mild anterior bowing   Lens 2-3+ Nuclear sclerosis with early brunescence, 2-3+ Cortical cataract 2-3+ Nuclear sclerosis with early brunescence, 2-3+ Cortical cataract   Vitreous Vitreous syneresis, Posterior vitreous detachment Vitreous syneresis, Posterior vitreous detachment       Fundus Exam      Right Left   Disc Pink and Sharp, Compact, mild PPA Mild Pallor, Sharp rim, +elevation   C/D Ratio 0.0 0.1   Macula Blunted foveal reflex, +CNV with pigment clumping/ring, +heme and edema - improved, RPE mottling and clumping, Drusen Blunted foveal reflex, large area of GA with  disciform scar and subretinal fibrosis, central pigment clumping, no heme   Vessels attenuated, mild tortuousity attenuated, Tortuous   Periphery Attached, scattered reticular degeneration, paving stone degeneration Attached, scattered reticular degeneration, scattered paving stone degeneration        Refraction    Wearing Rx      Sphere Cylinder Axis   Right -6.50 +2.00 152   Left -5.25 +1.00 018   Type: SVL          IMAGING AND PROCEDURES  Imaging and Procedures for 01/17/2021  OCT, Retina - OU - Both Eyes       Right Eye Quality was good. Central Foveal Thickness: 490. Progression has improved. Findings include abnormal foveal contour, subretinal fluid, subretinal hyper-reflective material, pigment epithelial detachment, choroidal neovascular membrane, intraretinal fluid (Interval improvement in SRHM/CNVM, interval shifting of SRF centrally).   Left Eye Quality was good. Central Foveal Thickness: 352. Progression has been stable. Findings include abnormal foveal contour, no IRF, no SRF, subretinal hyper-reflective material, outer retinal atrophy (large central disciform scar with ORA, Foveal notch).   Notes *Images captured and stored on drive  Diagnosis / Impression:  exu ARMD OU OD with active CNV -- Interval improvement in SRHM/CNVM, interval shifting of SRF centrally OS with inactive disciform scar  Clinical management:  See below  Abbreviations: NFP - Normal foveal profile. CME - cystoid macular edema. PED - pigment epithelial detachment. IRF - intraretinal fluid. SRF - subretinal fluid. EZ - ellipsoid zone. ERM - epiretinal membrane. ORA - outer retinal atrophy. ORT - outer retinal tubulation. SRHM - subretinal hyper-reflective material. IRHM - intraretinal hyper-reflective material        Intravitreal Injection, Pharmacologic  Agent - OD - Right Eye       Time Out 01/17/2021. 10:26 AM. Confirmed correct patient, procedure, site, and patient consented.    Anesthesia Topical anesthesia was used. Anesthetic medications included Lidocaine 2%, Proparacaine 0.5%.   Procedure Preparation included 5% betadine to ocular surface, eyelid speculum. A supplied needle was used.   Injection:  1.25 mg Bevacizumab (AVASTIN) 1.25mg /0.53mL SOLN   NDC: 40981-191-47, Lot: [id number] , Expiration date: 03/02/2021   Route: Intravitreal, Site: Right Eye, Waste: 0 mL  Post-op Post injection exam found visual acuity of at least counting fingers. The patient tolerated the procedure well. There were no complications. The patient received written and verbal post procedure care education. Post injection medications were not given.                 ASSESSMENT/PLAN:    ICD-10-CM   1. Exudative age-related macular degeneration of right eye with active choroidal neovascularization (HCC)  H35.3211 Intravitreal Injection, Pharmacologic Agent - OD - Right Eye    Bevacizumab (AVASTIN) SOLN 1.25 mg  2. Retinal edema  H35.81 OCT, Retina - OU - Both Eyes  3. Exudative age-related macular degeneration of left eye with inactive scar ([place])  H35.3223   4. Essential hypertension  I10   5. Hypertensive retinopathy of both eyes  H35.033   6. Combined forms of age-related cataract of both eyes  H25.813     1,2. Exudative age related macular degeneration OD  - OD with active CNV  - pt initially presented w/ 2 wk history of decreased vision OD  - history of intravitreal injections OS with Dr. [address] in 2017 and earlier -- pt reports stopping visits after the development of a corneal abrasion from a lid speculum.  - s/p IVA OD #1 (12.21.21)  - OCT shows interval improvement in SRHM/CNVM, interval shifting of SRF centrally  - BCVA OD - 20/40 from 20/100              - Recommend IVA OD #2 today, 01.24.22  - pt wishes to be treated with IVA  - RBA of procedure discussed, questions answered  - informed consent obtained and signed  - see procedure note  - f/u in 4 wks --  DFE/OCT, possible injection   3. Exudative age related macular degeneration, OS  - OS with inactive disciform scar  - history of intravitreal injections OS with Dr. 02.06.22 in 2017 and earlier -- pt reports stopping visits after the development of a corneal abrasion from a lid speculum.  - has not seen a retina specialist since 2017  - OS now with large area of central GA with overlying disciform scar  - OCT shows relatively stable disciform scar in comparison to 2017 OCT -- no IRF/SRF  - BCVA CF OS - No retinal intervention recommended at this time  - monitor  4,5. Hypertensive retinopathy OU - discussed importance of tight BP control - monitor  6. Mixed Cataract OU - The symptoms of cataract, surgical options, and treatments and risks were discussed with patient. - discussed diagnosis and progression - approaching visual significance - monitor  Ophthalmic Meds Ordered this visit:  Meds ordered this encounter  Medications  . Bevacizumab (AVASTIN) SOLN 1.25 mg      Return in about 4 weeks (around 02/14/2021) for f/u exu ARMD OD, DFE, OCT.  There are no Patient Instructions on file for this visit.  Explained the diagnoses, plan, and follow up with the patient and they expressed understanding.  Patient  expressed understanding of the importance of proper follow up care.   This document serves as a record of services personally performed by Gardiner Sleeper, MD, PhD. It was created on their behalf by Estill Bakes, COT an ophthalmic technician. The creation of this record is the provider's dictation and/or activities during the visit.    Electronically signed by: Estill Bakes, COT 1.21.22 @ 11:00 AM   This document serves as a record of services personally performed by Gardiner Sleeper, MD, PhD. It was created on their behalf by San Jetty. Owens Shark, OA an ophthalmic technician. The creation of this record is the provider's dictation and/or activities during the visit.    Electronically  signed by: San Jetty. Owens Shark, [place] 01.24.2022 11:00 AM  Gardiner Sleeper, M.D., Ph.D. Diseases & Surgery of the Retina and [place] 01/17/2021   I have reviewed the above documentation for accuracy and completeness, and I agree with the above. Gardiner Sleeper, M.D., Ph.D. 01/17/21 11:00 AM   Abbreviations: M myopia (nearsighted); A astigmatism; H hyperopia (farsighted); P presbyopia; Mrx spectacle prescription;  CTL contact lenses; OD right eye; OS left eye; OU both eyes  XT exotropia; ET esotropia; PEK punctate epithelial keratitis; PEE punctate epithelial erosions; DES dry eye syndrome; MGD meibomian gland dysfunction; ATs artificial tears; PFAT's preservative free artificial tears; [place] nuclear sclerotic cataract; PSC posterior subcapsular cataract; ERM epi-retinal membrane; PVD posterior vitreous detachment; RD retinal detachment; DM diabetes mellitus; DR diabetic retinopathy; NPDR non-proliferative diabetic retinopathy; PDR proliferative diabetic retinopathy; CSME clinically significant macular edema; DME diabetic macular edema; dbh dot blot hemorrhages; CWS cotton wool spot; POAG primary open angle glaucoma; C/D cup-to-disc ratio; HVF humphrey visual field; GVF goldmann visual field; OCT optical coherence tomography; IOP intraocular pressure; BRVO Branch retinal vein occlusion; CRVO central retinal vein occlusion; CRAO central retinal artery occlusion; BRAO branch retinal artery occlusion; RT retinal tear; SB scleral buckle; PPV pars plana vitrectomy; VH Vitreous hemorrhage; PRP panretinal laser photocoagulation; IVK intravitreal kenalog; VMT vitreomacular traction; MH Macular hole;  NVD neovascularization of the disc; NVE neovascularization elsewhere; AREDS age related eye disease study; ARMD age related macular degeneration; POAG primary open angle glaucoma; EBMD epithelial/anterior basement membrane dystrophy; ACIOL anterior chamber intraocular lens; IOL intraocular  lens; PCIOL posterior chamber intraocular lens; Phaco/IOL phacoemulsification with intraocular lens placement; [place] photorefractive keratectomy; LASIK laser assisted in situ keratomileusis; HTN hypertension; DM diabetes mellitus; COPD chronic obstructive pulmonary disease

## 2021-01-17 ENCOUNTER — Ambulatory Visit (INDEPENDENT_AMBULATORY_CARE_PROVIDER_SITE_OTHER): Payer: Medicare Other | Admitting: Ophthalmology

## 2021-01-17 ENCOUNTER — Encounter (INDEPENDENT_AMBULATORY_CARE_PROVIDER_SITE_OTHER): Payer: Self-pay | Admitting: Ophthalmology

## 2021-01-17 ENCOUNTER — Other Ambulatory Visit: Payer: Self-pay

## 2021-01-17 DIAGNOSIS — H25813 Combined forms of age-related cataract, bilateral: Secondary | ICD-10-CM | POA: Diagnosis not present

## 2021-01-17 DIAGNOSIS — H353211 Exudative age-related macular degeneration, right eye, with active choroidal neovascularization: Secondary | ICD-10-CM

## 2021-01-17 DIAGNOSIS — H35033 Hypertensive retinopathy, bilateral: Secondary | ICD-10-CM | POA: Diagnosis not present

## 2021-01-17 DIAGNOSIS — H353223 Exudative age-related macular degeneration, left eye, with inactive scar: Secondary | ICD-10-CM | POA: Diagnosis not present

## 2021-01-17 DIAGNOSIS — H3581 Retinal edema: Secondary | ICD-10-CM | POA: Diagnosis not present

## 2021-01-17 DIAGNOSIS — I10 Essential (primary) hypertension: Secondary | ICD-10-CM

## 2021-01-17 MED ORDER — BEVACIZUMAB CHEMO INJECTION 1.25MG/0.05ML SYRINGE FOR KALEIDOSCOPE
1.2500 mg | INTRAVITREAL | Status: AC | PRN
  Administered 2021-01-17: 1.25 mg via INTRAVITREAL

## 2021-02-04 DIAGNOSIS — J45998 Other asthma: Secondary | ICD-10-CM | POA: Diagnosis not present

## 2021-02-04 DIAGNOSIS — J441 Chronic obstructive pulmonary disease with (acute) exacerbation: Secondary | ICD-10-CM | POA: Diagnosis not present

## 2021-02-04 DIAGNOSIS — E785 Hyperlipidemia, unspecified: Secondary | ICD-10-CM | POA: Diagnosis not present

## 2021-02-04 DIAGNOSIS — J439 Emphysema, unspecified: Secondary | ICD-10-CM | POA: Diagnosis not present

## 2021-02-04 DIAGNOSIS — K219 Gastro-esophageal reflux disease without esophagitis: Secondary | ICD-10-CM | POA: Diagnosis not present

## 2021-02-04 DIAGNOSIS — F329 Major depressive disorder, single episode, unspecified: Secondary | ICD-10-CM | POA: Diagnosis not present

## 2021-02-04 DIAGNOSIS — G4700 Insomnia, unspecified: Secondary | ICD-10-CM | POA: Diagnosis not present

## 2021-02-04 DIAGNOSIS — E039 Hypothyroidism, unspecified: Secondary | ICD-10-CM | POA: Diagnosis not present

## 2021-02-04 DIAGNOSIS — I10 Essential (primary) hypertension: Secondary | ICD-10-CM | POA: Diagnosis not present

## 2021-02-04 DIAGNOSIS — I509 Heart failure, unspecified: Secondary | ICD-10-CM | POA: Diagnosis not present

## 2021-02-10 NOTE — Progress Notes (Signed)
[facility] Note  02/14/2021     CHIEF COMPLAINT Patient presents for Retina Follow Up   HISTORY OF PRESENT ILLNESS: Kimberly Waters is a 75 y.o. female who presents to the clinic today for:   HPI    Retina Follow Up    Patient presents with  Wet AMD.  In right eye.  This started weeks ago.  Severity is moderate.  Duration of weeks.  Since onset it is stable.  I, the attending physician,  performed the HPI with the patient and updated documentation appropriately.          Comments    Pt states vision is the same OU.  Pt denies eye pain or discomfort and denies any new or worsening floaters or fol OU.       Last edited by Bernarda Caffey, MD on 02/14/2021  8:19 AM. (History)    pt reports no change in vision    Referring physician: Josetta Huddle, MD [address] [place],   [address]  HISTORICAL INFORMATION:   Selected notes from the MEDICAL RECORD NUMBER Referred by Dr. Marin Comment for eval of decreased VA OD.   CURRENT MEDICATIONS: No current outpatient medications on file. (Ophthalmic Drugs)   No current facility-administered medications for this visit. (Ophthalmic Drugs)   Current Outpatient Medications (Other)  Medication Sig  . albuterol (PROVENTIL,VENTOLIN) 90 MCG/ACT inhaler Inhale 2 puffs into the lungs every 4 (four) hours as needed.  Marland Kitchen albuterol (VENTOLIN HFA) 108 (90 Base) MCG/ACT inhaler Inhale 2 puffs into the lungs every 4 (four) hours as needed.  Marland Kitchen anastrozole (ARIMIDEX) 1 MG tablet Take 1 tablet (1 mg total) by mouth daily.  Marland Kitchen aspirin EC 81 MG EC tablet Take 1 tablet (81 mg total) by mouth daily.  . betamethasone dipropionate 5.57 % cream 1 application  . Budeson-Glycopyrrol-Formoterol (BREZTRI AEROSPHERE) 160-9-4.8 MCG/ACT AERO Inhale 2 puffs into the lungs 2 (two) times daily.  . budesonide-formoterol (SYMBICORT) 80-4.5 MCG/ACT inhaler 2 puffs  . buPROPion (WELLBUTRIN SR) 150 MG 12 hr tablet Take 150 mg by mouth  daily.   . clobetasol ointment (TEMOVATE) 0.05 % as needed.  . diphenhydrAMINE (BENADRYL) 25 MG tablet Take 25 mg by mouth as needed for sleep.  Marland Kitchen escitalopram (LEXAPRO) 10 MG tablet Take 1 tablet (10 mg total) by mouth daily.  Marland Kitchen ezetimibe (ZETIA) 10 MG tablet Take 1 tablet (10 mg total) by mouth daily.  . furosemide (LASIX) 40 MG tablet Take 1 tablet (40 mg total) by mouth daily.  . hydrOXYzine (ATARAX/VISTARIL) 25 MG tablet Take 25 mg by mouth at bedtime.  Marland Kitchen imipramine (TOFRANIL) 50 MG tablet Take 50 mg by mouth at bedtime.  Marland Kitchen ipratropium (ATROVENT) 0.06 % nasal spray Place 2 sprays into the nose 4 (four) times daily.  Marland Kitchen ketoconazole (NIZORAL) 2 % cream Apply 1 application topically daily.  Marland Kitchen levocetirizine (XYZAL) 5 MG tablet Take 5 mg by mouth as needed for allergies.  Marland Kitchen levothyroxine (SYNTHROID, LEVOTHROID) 112 MCG tablet TAKE ONE TABLET BY MOUTH ONCE DAILY  . lisinopril-hydrochlorothiazide (ZESTORETIC) 20-25 MG tablet Take 1 tablet by mouth daily.  . meloxicam (MOBIC) 7.5 MG tablet Take 7.5 mg by mouth at bedtime.  . mometasone (ELOCON) 0.1 % ointment Apply topically as needed.  . nitroGLYCERIN (NITROSTAT) 0.4 MG SL tablet as needed.  Marland Kitchen omeprazole (PRILOSEC) 20 MG capsule Take 20 mg by mouth daily.  . Potassium Chloride ER 20 MEQ TBCR 1 tablet with food  .  potassium chloride SA (K-DUR,KLOR-CON) 20 MEQ tablet Take 1 tablet (20 mEq total) by mouth daily.  . predniSONE (DELTASONE) 10 MG tablet Take by mouth. (Patient not taking: Reported on 01/17/2021)  . rosuvastatin (CRESTOR) 20 MG tablet Take 1 tablet (20 mg total) by mouth daily.  . simvastatin (ZOCOR) 20 MG tablet Take 1 tablet by mouth every evening.  . tacrolimus (PROTOPIC) 0.1 % ointment Apply topically as needed.   No current facility-administered medications for this visit. (Other)      REVIEW OF SYSTEMS: ROS    Positive for: Gastrointestinal, Endocrine, Cardiovascular, Eyes, Respiratory, Psychiatric   Negative for:  Constitutional, Neurological, Skin, Genitourinary, Musculoskeletal, HENT, Allergic/Imm, Heme/Lymph   Last edited by Doneen Poisson on 02/14/2021  7:59 AM. (History)       ALLERGIES Allergies  Allergen Reactions  . Latex     Other reaction(s): Other (See Comments) Tears skin.  . Adhesive [Tape] Other (See Comments)    (skin tears)  Tolerates PAPER TAPE  . Codeine Nausea Only  . Other Nausea Only    Anesthesia--nausea     PAST MEDICAL HISTORY Past Medical History:  Diagnosis Date  . Allergy   . Breast cancer ([place])   . Colon polyps   . Depression   . Dyspnea    SEASONAL  . Family history of breast cancer   . Family history of colon cancer   . GERD (gastroesophageal reflux disease)   . History of radiation therapy 06/05/17-07/20/17   right chest wall 50.4 Gy in 28 fractions, right axillary nodal region 45 Gy in 25 fractions  . Hyperlipidemia   . Hypertension   . Hypothyroidism   . Peripheral neuropathy 06/18/2019  . PONV (postoperative nausea and vomiting)    Past Surgical History:  Procedure Laterality Date  . ABDOMINAL HYSTERECTOMY    . KNEE SURGERY Right   . MASTECTOMY W/ SENTINEL NODE BIOPSY Right 03/05/2017   Procedure: BILATERAL TOTAL MASTECTOMIES WITH RIGHT SENTINEL LYMPH NODE BIOPSIES;  Surgeon: Excell Seltzer, MD;  Location: [facility];  Service: General;  Laterality: Right;  . SHOULDER SURGERY     BILAT    FAMILY HISTORY Family History  Problem Relation Age of Onset  . Heart disease Mother 49       stents  . Colon cancer Maternal Grandfather 62  . Colon cancer Maternal Aunt 74  . Colon cancer Maternal Uncle        dx in his 74s  . Breast cancer Paternal Aunt 19  . Breast cancer Cousin 37       paternal first cousin  . Lung cancer Paternal Grandfather   . Colon cancer Maternal Uncle   . Head & neck cancer Maternal Uncle        oral cancer    SOCIAL HISTORY Social History   Tobacco Use  . Smoking status: Former Smoker    Packs/day: 1.00     Years: 45.00    Pack years: 45.00    Types: Cigarettes    Quit date: 02/22/2017    Years since quitting: 3.9  . Smokeless tobacco: Never Used  Vaping Use  . Vaping Use: Never used  Substance Use Topics  . Alcohol use: No    Alcohol/week: 0.0 standard drinks  . Drug use: No         OPHTHALMIC EXAM:  Base Eye Exam    Visual Acuity (Snellen - Linear)      Right Left   Dist cc 20/40 +1 CF @ 1'  Dist ph cc NI NI   Correction: Glasses       Tonometry (Tonopen, 8:03 AM)      Right Left   Pressure 19 19       Pupils      Dark Light Shape React APD   Right 3 2 Round Brisk 0   Left 3 2 Round Brisk 0       Visual Fields      Left Right    Full Full       Extraocular Movement      Right Left    Full Full       Neuro/Psych    Oriented x3: Yes   Mood/Affect: Normal       Dilation    Both eyes: 1.0% Mydriacyl, 2.5% Phenylephrine @ 8:03 AM        Slit Lamp and Fundus Exam    Slit Lamp Exam      Right Left   Lids/Lashes Dermatochalasis - upper lid Dermatochalasis - upper lid   Conjunctiva/Sclera nasal and temporal pinguecula nasal and temporal pinguecula   Cornea 2+ Punctate epithelial erosions, irregular epi, arcus, decreased TBUT 2+ Punctate epithelial erosions, irregular epi, arcus, decreased TBUT   Anterior Chamber deep, narrow temporal angle deep, narrow temporal angle   Iris Round and moderately dilated, mild anterior bowing Round and moderately dilated, mild anterior bowing   Lens 2-3+ Nuclear sclerosis with early brunescence, 2-3+ Cortical cataract 2-3+ Nuclear sclerosis with early brunescence, 2-3+ Cortical cataract   Vitreous Vitreous syneresis, Posterior vitreous detachment Vitreous syneresis, Posterior vitreous detachment       Fundus Exam      Right Left   Disc Pink and Sharp, Compact, mild PPA Mild Pallor, Sharp rim, +elevation   C/D Ratio 0.0 0.1   Macula Blunted foveal reflex, +CNV with pigment clumping/ring, +heme and edema - improved, RPE  mottling and clumping, Drusen Blunted foveal reflex, large area of GA with disciform scar and subretinal fibrosis, central pigment clumping, no heme   Vessels attenuated, mild tortuousity attenuated, Tortuous   Periphery Attached, scattered reticular degeneration, paving stone degeneration Attached, scattered reticular degeneration, scattered paving stone degeneration        Refraction    Wearing Rx      Sphere Cylinder Axis   Right -6.50 +2.00 152   Left -5.25 +1.00 018   Type: SVL          IMAGING AND PROCEDURES  Imaging and Procedures for 02/14/2021  OCT, Retina - OU - Both Eyes       Right Eye Quality was good. Central Foveal Thickness: 432. Progression has improved. Findings include abnormal foveal contour, subretinal fluid, subretinal hyper-reflective material, pigment epithelial detachment, choroidal neovascular membrane, intraretinal fluid (Interval improvement in IRF/SRF).   Left Eye Quality was good. Central Foveal Thickness: 342. Progression has improved. Findings include abnormal foveal contour, no IRF, no SRF, subretinal hyper-reflective material, outer retinal atrophy (large central disciform scar with ORA, Foveal notch, interval improvement in trace cystic changes and temporal edema).   Notes *Images captured and stored on drive  Diagnosis / Impression:  exu ARMD OU OD with active CNV -- Interval improvement in IRF/SRF OS with inactive disciform scar with interval improvement in trace cystic changes and temporal edema  Clinical management:  See below  Abbreviations: NFP - Normal foveal profile. CME - cystoid macular edema. PED - pigment epithelial detachment. IRF - intraretinal fluid. SRF - subretinal fluid. EZ - ellipsoid zone. ERM - epiretinal membrane.  ORA - outer retinal atrophy. ORT - outer retinal tubulation. SRHM - subretinal hyper-reflective material. IRHM - intraretinal hyper-reflective material        Intravitreal Injection, Pharmacologic Agent -  OD - Right Eye       Time Out 02/14/2021. 8:03 AM. Confirmed correct patient, procedure, site, and patient consented.   Anesthesia Topical anesthesia was used. Anesthetic medications included Lidocaine 2%, Proparacaine 0.5%.   Procedure Preparation included 5% betadine to ocular surface, eyelid speculum. A supplied needle was used.   Injection:  1.25 mg Bevacizumab (AVASTIN) 1.25mg /0.25mL SOLN   NDC: 25427-062-37, Lot: [id number], Expiration date: 03/31/2021   Route: Intravitreal, Site: Right Eye, Waste: 0.05 mL  Post-op Post injection exam found visual acuity of at least counting fingers. The patient tolerated the procedure well. There were no complications. The patient received written and verbal post procedure care education. Post injection medications were not given.                 ASSESSMENT/PLAN:    ICD-10-CM   1. Exudative age-related macular degeneration of right eye with active choroidal neovascularization (HCC)  H35.3211 Intravitreal Injection, Pharmacologic Agent - OD - Right Eye    Bevacizumab (AVASTIN) SOLN 1.25 mg  2. Retinal edema  H35.81 OCT, Retina - OU - Both Eyes  3. Exudative age-related macular degeneration of left eye with inactive scar ([place])  H35.3223   4. Essential hypertension  I10   5. Hypertensive retinopathy of both eyes  H35.033   6. Combined forms of age-related cataract of both eyes  H25.813     1,2. Exudative age related macular degeneration OD  - OD with active CNV  - pt initially presented w/ 2 wk history of decreased vision OD  - history of intravitreal injections OS with Dr. Zadie Rhine in 2017 and earlier -- pt reports stopping visits after the development of a corneal abrasion from a lid speculum.  - s/p IVA OD #1 (12.21.21), #2 (01.24.22)  - OCT shows interval improvement in IRF/SRF overlying shallow PED/CNVM  - BCVA OD - stable at 20/40               - Recommend IVA OD #3 today, 02.21.22  - pt wishes to be treated with IVA  - RBA of  procedure discussed, questions answered  - informed consent obtained and signed  - see procedure note  - f/u in 4 wks -- DFE/OCT, possible injection   3. Exudative age related macular degeneration, OS  - OS with inactive disciform scar  - history of intravitreal injections OS with Dr. Zadie Rhine in 2017 and earlier -- pt reports stopping visits after the development of a corneal abrasion from a lid speculum.  - has not seen a retina specialist since 2017  - OS now with large area of central GA with overlying disciform scar  - OCT shows relatively stable disciform scar in comparison to 2017 OCT -- no IRF/SRF  - BCVA CF OS - No retinal intervention recommended at this time  - monitor  4,5. Hypertensive retinopathy OU - discussed importance of tight BP control - monitor  6. Mixed Cataract OU - The symptoms of cataract, surgical options, and treatments and risks were discussed with patient. - discussed diagnosis and progression - approaching visual significance - monitor  Ophthalmic Meds Ordered this visit:  Meds ordered this encounter  Medications  . Bevacizumab (AVASTIN) SOLN 1.25 mg      Return in about 4 weeks (around 03/14/2021) for f/u 4 weeks,  exu ARMD OD, DFE, OCT.  There are no Patient Instructions on file for this visit.  This document serves as a record of services personally performed by Gardiner Sleeper, MD, PhD. It was created on their behalf by Leeann Must, [place], an ophthalmic technician. The creation of this record is the provider's dictation and/or activities during the visit.    Electronically signed by: Leeann Must, [place] 02.17.2022 8:27 AM   This document serves as a record of services personally performed by Gardiner Sleeper, MD, PhD. It was created on their behalf by San Jetty. Owens Shark, OA an ophthalmic technician. The creation of this record is the provider's dictation and/or activities during the visit.    Electronically signed by: San Jetty. Owens Shark, [place] 02.21.2022  8:27 AM  Gardiner Sleeper, M.D., Ph.D. Diseases & Surgery of the Retina and [place] 02/14/2021   I have reviewed the above documentation for accuracy and completeness, and I agree with the above. Gardiner Sleeper, M.D., Ph.D. 02/14/21 8:27 AM  Abbreviations: M myopia (nearsighted); A astigmatism; H hyperopia (farsighted); P presbyopia; Mrx spectacle prescription;  CTL contact lenses; OD right eye; OS left eye; OU both eyes  XT exotropia; ET esotropia; PEK punctate epithelial keratitis; PEE punctate epithelial erosions; DES dry eye syndrome; MGD meibomian gland dysfunction; ATs artificial tears; PFAT's preservative free artificial tears; [place] nuclear sclerotic cataract; PSC posterior subcapsular cataract; ERM epi-retinal membrane; PVD posterior vitreous detachment; RD retinal detachment; DM diabetes mellitus; DR diabetic retinopathy; NPDR non-proliferative diabetic retinopathy; PDR proliferative diabetic retinopathy; CSME clinically significant macular edema; DME diabetic macular edema; dbh dot blot hemorrhages; CWS cotton wool spot; POAG primary open angle glaucoma; C/D cup-to-disc ratio; HVF humphrey visual field; GVF goldmann visual field; OCT optical coherence tomography; IOP intraocular pressure; BRVO Branch retinal vein occlusion; CRVO central retinal vein occlusion; CRAO central retinal artery occlusion; BRAO branch retinal artery occlusion; RT retinal tear; SB scleral buckle; PPV pars plana vitrectomy; VH Vitreous hemorrhage; PRP panretinal laser photocoagulation; IVK intravitreal kenalog; VMT vitreomacular traction; MH Macular hole;  NVD neovascularization of the disc; NVE neovascularization elsewhere; AREDS age related eye disease study; ARMD age related macular degeneration; POAG primary open angle glaucoma; EBMD epithelial/anterior basement membrane dystrophy; ACIOL anterior chamber intraocular lens; IOL intraocular lens; PCIOL posterior chamber intraocular lens;  Phaco/IOL phacoemulsification with intraocular lens placement; [place] photorefractive keratectomy; LASIK laser assisted in situ keratomileusis; HTN hypertension; DM diabetes mellitus; COPD chronic obstructive pulmonary disease

## 2021-02-14 ENCOUNTER — Encounter (INDEPENDENT_AMBULATORY_CARE_PROVIDER_SITE_OTHER): Payer: Self-pay | Admitting: Ophthalmology

## 2021-02-14 ENCOUNTER — Other Ambulatory Visit: Payer: Self-pay

## 2021-02-14 ENCOUNTER — Ambulatory Visit (INDEPENDENT_AMBULATORY_CARE_PROVIDER_SITE_OTHER): Payer: Medicare Other | Admitting: Ophthalmology

## 2021-02-14 DIAGNOSIS — I10 Essential (primary) hypertension: Secondary | ICD-10-CM

## 2021-02-14 DIAGNOSIS — H3581 Retinal edema: Secondary | ICD-10-CM | POA: Diagnosis not present

## 2021-02-14 DIAGNOSIS — H353211 Exudative age-related macular degeneration, right eye, with active choroidal neovascularization: Secondary | ICD-10-CM | POA: Diagnosis not present

## 2021-02-14 DIAGNOSIS — H353223 Exudative age-related macular degeneration, left eye, with inactive scar: Secondary | ICD-10-CM | POA: Diagnosis not present

## 2021-02-14 DIAGNOSIS — H35033 Hypertensive retinopathy, bilateral: Secondary | ICD-10-CM | POA: Diagnosis not present

## 2021-02-14 DIAGNOSIS — H25813 Combined forms of age-related cataract, bilateral: Secondary | ICD-10-CM

## 2021-02-14 MED ORDER — BEVACIZUMAB CHEMO INJECTION 1.25MG/0.05ML SYRINGE FOR KALEIDOSCOPE
1.2500 mg | INTRAVITREAL | Status: AC | PRN
  Administered 2021-02-14: 1.25 mg via INTRAVITREAL

## 2021-02-23 DIAGNOSIS — M170 Bilateral primary osteoarthritis of knee: Secondary | ICD-10-CM | POA: Diagnosis not present

## 2021-02-23 DIAGNOSIS — C50919 Malignant neoplasm of unspecified site of unspecified female breast: Secondary | ICD-10-CM | POA: Diagnosis not present

## 2021-02-23 DIAGNOSIS — I509 Heart failure, unspecified: Secondary | ICD-10-CM | POA: Diagnosis not present

## 2021-02-23 DIAGNOSIS — K219 Gastro-esophageal reflux disease without esophagitis: Secondary | ICD-10-CM | POA: Diagnosis not present

## 2021-02-23 DIAGNOSIS — E785 Hyperlipidemia, unspecified: Secondary | ICD-10-CM | POA: Diagnosis not present

## 2021-02-23 DIAGNOSIS — F322 Major depressive disorder, single episode, severe without psychotic features: Secondary | ICD-10-CM | POA: Diagnosis not present

## 2021-02-23 DIAGNOSIS — I10 Essential (primary) hypertension: Secondary | ICD-10-CM | POA: Diagnosis not present

## 2021-02-23 DIAGNOSIS — J439 Emphysema, unspecified: Secondary | ICD-10-CM | POA: Diagnosis not present

## 2021-02-23 DIAGNOSIS — G4700 Insomnia, unspecified: Secondary | ICD-10-CM | POA: Diagnosis not present

## 2021-02-23 DIAGNOSIS — J45998 Other asthma: Secondary | ICD-10-CM | POA: Diagnosis not present

## 2021-02-23 DIAGNOSIS — E039 Hypothyroidism, unspecified: Secondary | ICD-10-CM | POA: Diagnosis not present

## 2021-03-14 ENCOUNTER — Encounter (INDEPENDENT_AMBULATORY_CARE_PROVIDER_SITE_OTHER): Payer: Medicare Other | Admitting: Ophthalmology

## 2021-03-14 NOTE — Progress Notes (Signed)
[facility] Note  03/15/2021     CHIEF COMPLAINT Patient presents for Retina Follow Up   HISTORY OF PRESENT ILLNESS: Kimberly Waters is a 75 y.o. female who presents to the clinic today for:   HPI    Retina Follow Up    Patient presents with  Wet AMD.  In right eye.  This started 4 weeks ago.  I, the attending physician,  performed the HPI with the patient and updated documentation appropriately.          Comments    Patient here for 4 weeks retina follow up for exu ARMD OD. Patient states vision doing better. No eye pain.        Last edited by Bernarda Caffey, MD on 03/15/2021  1:46 PM. (History)     Referring physician: Josetta Huddle, MD [place] [place],  [place] [address]  HISTORICAL INFORMATION:   Selected notes from the MEDICAL RECORD NUMBER Referred by Dr. Marin Comment for eval of decreased VA OD.   CURRENT MEDICATIONS: No current outpatient medications on file. (Ophthalmic Drugs)   No current facility-administered medications for this visit. (Ophthalmic Drugs)   Current Outpatient Medications (Other)  Medication Sig  . albuterol (PROVENTIL,VENTOLIN) 90 MCG/ACT inhaler Inhale 2 puffs into the lungs every 4 (four) hours as needed.  Marland Kitchen albuterol (VENTOLIN HFA) 108 (90 Base) MCG/ACT inhaler Inhale 2 puffs into the lungs every 4 (four) hours as needed.  Marland Kitchen anastrozole (ARIMIDEX) 1 MG tablet Take 1 tablet (1 mg total) by mouth daily.  Marland Kitchen aspirin EC 81 MG EC tablet Take 1 tablet (81 mg total) by mouth daily.  . betamethasone dipropionate 1.49 % cream 1 application  . Budeson-Glycopyrrol-Formoterol (BREZTRI AEROSPHERE) 160-9-4.8 MCG/ACT AERO Inhale 2 puffs into the lungs 2 (two) times daily.  . budesonide-formoterol (SYMBICORT) 80-4.5 MCG/ACT inhaler 2 puffs  . buPROPion (WELLBUTRIN SR) 150 MG 12 hr tablet Take 150 mg by mouth daily.   . clobetasol ointment (TEMOVATE) 0.05 % as needed.  . diphenhydrAMINE (BENADRYL) 25 MG tablet Take 25 mg  by mouth as needed for sleep.  Marland Kitchen escitalopram (LEXAPRO) 10 MG tablet Take 1 tablet (10 mg total) by mouth daily.  Marland Kitchen ezetimibe (ZETIA) 10 MG tablet Take 1 tablet (10 mg total) by mouth daily.  . furosemide (LASIX) 40 MG tablet Take 1 tablet (40 mg total) by mouth daily.  . hydrOXYzine (ATARAX/VISTARIL) 25 MG tablet Take 25 mg by mouth at bedtime.  Marland Kitchen imipramine (TOFRANIL) 50 MG tablet Take 50 mg by mouth at bedtime.  Marland Kitchen ipratropium (ATROVENT) 0.06 % nasal spray Place 2 sprays into the nose 4 (four) times daily.  Marland Kitchen ketoconazole (NIZORAL) 2 % cream Apply 1 application topically daily.  Marland Kitchen levocetirizine (XYZAL) 5 MG tablet Take 5 mg by mouth as needed for allergies.  Marland Kitchen levothyroxine (SYNTHROID, LEVOTHROID) 112 MCG tablet TAKE ONE TABLET BY MOUTH ONCE DAILY  . lisinopril-hydrochlorothiazide (ZESTORETIC) 20-25 MG tablet Take 1 tablet by mouth daily.  . meloxicam (MOBIC) 7.5 MG tablet Take 7.5 mg by mouth at bedtime.  . mometasone (ELOCON) 0.1 % ointment Apply topically as needed.  . nitroGLYCERIN (NITROSTAT) 0.4 MG SL tablet as needed.  Marland Kitchen omeprazole (PRILOSEC) 20 MG capsule Take 20 mg by mouth daily.  . Potassium Chloride ER 20 MEQ TBCR 1 tablet with food  . potassium chloride SA (K-DUR,KLOR-CON) 20 MEQ tablet Take 1 tablet (20 mEq total) by mouth daily.  . predniSONE (DELTASONE) 10 MG  tablet Take by mouth. (Patient not taking: Reported on 01/17/2021)  . rosuvastatin (CRESTOR) 20 MG tablet Take 1 tablet (20 mg total) by mouth daily.  . simvastatin (ZOCOR) 20 MG tablet Take 1 tablet by mouth every evening.  . tacrolimus (PROTOPIC) 0.1 % ointment Apply topically as needed.   No current facility-administered medications for this visit. (Other)      REVIEW OF SYSTEMS: ROS    Positive for: Gastrointestinal, Endocrine, Cardiovascular, Eyes, Respiratory, Psychiatric   Negative for: Constitutional, Neurological, Skin, Genitourinary, Musculoskeletal, HENT, Allergic/Imm, Heme/Lymph   Last edited by  Theodore Demark, COA on 03/15/2021  1:23 PM. (History)       ALLERGIES Allergies  Allergen Reactions  . Latex     Other reaction(s): Other (See Comments) Tears skin.  . Adhesive [Tape] Other (See Comments)    (skin tears)  Tolerates PAPER TAPE  . Codeine Nausea Only  . Other Nausea Only    Anesthesia--nausea     PAST MEDICAL HISTORY Past Medical History:  Diagnosis Date  . Allergy   . Breast cancer ([place])   . Colon polyps   . Depression   . Dyspnea    SEASONAL  . Family history of breast cancer   . Family history of colon cancer   . GERD (gastroesophageal reflux disease)   . History of radiation therapy 06/05/17-07/20/17   right chest wall 50.4 Gy in 28 fractions, right axillary nodal region 45 Gy in 25 fractions  . Hyperlipidemia   . Hypertension   . Hypothyroidism   . Peripheral neuropathy 06/18/2019  . PONV (postoperative nausea and vomiting)    Past Surgical History:  Procedure Laterality Date  . ABDOMINAL HYSTERECTOMY    . KNEE SURGERY Right   . MASTECTOMY W/ SENTINEL NODE BIOPSY Right 03/05/2017   Procedure: BILATERAL TOTAL MASTECTOMIES WITH RIGHT SENTINEL LYMPH NODE BIOPSIES;  Surgeon: Excell Seltzer, MD;  Location: [facility];  Service: General;  Laterality: Right;  . SHOULDER SURGERY     BILAT    FAMILY HISTORY Family History  Problem Relation Age of Onset  . Heart disease Mother 49       stents  . Colon cancer Maternal Grandfather 60  . Colon cancer Maternal Aunt 60  . Colon cancer Maternal Uncle        dx in his 6s  . Breast cancer Paternal Aunt 49  . Breast cancer Cousin 19       paternal first cousin  . Lung cancer Paternal Grandfather   . Colon cancer Maternal Uncle   . Head & neck cancer Maternal Uncle        oral cancer    SOCIAL HISTORY Social History   Tobacco Use  . Smoking status: Former Smoker    Packs/day: 1.00    Years: 45.00    Pack years: 45.00    Types: Cigarettes    Quit date: 02/22/2017    Years since quitting: 4.0   . Smokeless tobacco: Never Used  Vaping Use  . Vaping Use: Never used  Substance Use Topics  . Alcohol use: No    Alcohol/week: 0.0 standard drinks  . Drug use: No         OPHTHALMIC EXAM:  Base Eye Exam    Visual Acuity (Snellen - Linear)      Right Left   Dist cc 20/40 -2 CF at 3'   Dist ph cc NI NI   Correction: Glasses       Tonometry (Tonopen, 1:21 PM)  Right Left   Pressure 14 18       Pupils      Dark Light Shape React APD   Right 3 2 Round Brisk None   Left 3 2 Round Brisk None       Visual Fields (Counting fingers)      Left Right    Full Full       Extraocular Movement      Right Left    Full Full       Neuro/Psych    Oriented x3: Yes   Mood/Affect: Normal       Dilation    Both eyes: 1.0% Mydriacyl, 2.5% Phenylephrine @ 1:21 PM        Slit Lamp and Fundus Exam    Slit Lamp Exam      Right Left   Lids/Lashes Dermatochalasis - upper lid Dermatochalasis - upper lid   Conjunctiva/Sclera nasal and temporal pinguecula nasal and temporal pinguecula   Cornea 2+ Punctate epithelial erosions, irregular epi, arcus, decreased TBUT 2+ Punctate epithelial erosions, irregular epi, arcus, decreased TBUT   Anterior Chamber deep, narrow temporal angle deep, narrow temporal angle   Iris Round and moderately dilated, mild anterior bowing Round and moderately dilated, mild anterior bowing   Lens 2-3+ Nuclear sclerosis with early brunescence, 2-3+ Cortical cataract 2-3+ Nuclear sclerosis with early brunescence, 2-3+ Cortical cataract   Vitreous Vitreous syneresis, Posterior vitreous detachment Vitreous syneresis, Posterior vitreous detachment       Fundus Exam      Right Left   Disc Pink and Sharp, Compact, mild PPA Mild Pallor, Sharp rim, +elevation   C/D Ratio 0.0 0.1   Macula Blunted foveal reflex, +CNV with pigment clumping/ring, +heme and edema - improved, RPE mottling and clumping, [place]en Blunted foveal reflex, large area of GA with disciform scar  and subretinal fibrosis, central pigment clumping, no heme   Vessels attenuated, mild tortuousity attenuated, Tortuous   Periphery Attached, scattered reticular degeneration, paving stone degeneration Attached, scattered reticular degeneration, scattered paving stone degeneration        Refraction    Wearing Rx      Sphere Cylinder Axis   Right -6.50 +2.00 152   Left -5.25 +1.00 018   Type: SVL          IMAGING AND PROCEDURES  Imaging and Procedures for 03/15/2021  OCT, Retina - OU - Both Eyes       Right Eye Quality was good. Central Foveal Thickness: 388. Progression has improved. Findings include abnormal foveal contour, subretinal fluid, subretinal hyper-reflective material, pigment epithelial detachment, choroidal neovascular membrane, no IRF (Interval improvement in IRF/SRF).   Left Eye Quality was good. Central Foveal Thickness: 327. Progression has been stable. Findings include abnormal foveal contour, no IRF, no SRF, subretinal hyper-reflective material, outer retinal atrophy (large central disciform scar with ORA, Foveal notch, stable improvement in trace cystic changes and temporal edema).   Notes *Images captured and stored on drive  Diagnosis / Impression:  exu ARMD OU OD with active CNV -- Interval improvement in IRF/SRF OS with inactive disciform scar with stable improvement in trace cystic changes and temporal edema  Clinical management:  See below  Abbreviations: NFP - Normal foveal profile. CME - cystoid macular edema. PED - pigment epithelial detachment. IRF - intraretinal fluid. SRF - subretinal fluid. EZ - ellipsoid zone. ERM - epiretinal membrane. ORA - outer retinal atrophy. ORT - outer retinal tubulation. SRHM - subretinal hyper-reflective material. IRHM - intraretinal hyper-reflective material  Intravitreal Injection, Pharmacologic Agent - OD - Right Eye       Time Out 03/15/2021. 1:29 PM. Confirmed correct patient, procedure, site, and  patient consented.   Anesthesia Topical anesthesia was used. Anesthetic medications included Lidocaine 2%, Proparacaine 0.5%.   Procedure Preparation included 5% betadine to ocular surface, eyelid speculum. A supplied needle was used.   Injection:  1.25 mg Bevacizumab (AVASTIN) 1.25mg /0.14mL SOLN   NDC: H061816, Lot: [id number], Expiration date: 05/02/2021   Route: Intravitreal, Site: Right Eye, Waste: 0 mL  Post-op Post injection exam found visual acuity of at least counting fingers. The patient tolerated the procedure well. There were no complications. The patient received written and verbal post procedure care education. Post injection medications were not given.                 ASSESSMENT/PLAN:    ICD-10-CM   1. Exudative age-related macular degeneration of right eye with active choroidal neovascularization (HCC)  H35.3211 Intravitreal Injection, Pharmacologic Agent - OD - Right Eye    Bevacizumab (AVASTIN) SOLN 1.25 mg  2. Retinal edema  H35.81 OCT, Retina - OU - Both Eyes  3. Exudative age-related macular degeneration of left eye with inactive scar ([place])  H35.3223   4. Essential hypertension  I10   5. Hypertensive retinopathy of both eyes  H35.033   6. Combined forms of age-related cataract of both eyes  H25.813     1,2. Exudative age related macular degeneration OD  - OD with active CNV  - pt initially presented w/ 2 wk history of decreased vision OD  - history of intravitreal injections OS with Dr. Zadie Rhine in 2017 and earlier -- pt reports stopping visits after the development of a corneal abrasion from a lid speculum.  - s/p IVA OD #1 (12.21.21), #2 (01.24.22), #3 (2.21.22)  - OCT shows interval improvement in IRF/SRF overlying shallow PED/CNVM  - BCVA OD - stable at 20/40-2             - Recommend IVA OD #4 today, 03.22.22  - pt wishes to be treated with IVA  - RBA of procedure discussed, questions answered  - informed consent obtained and signed  - see  procedure note  - f/u in 4 wks -- DFE/OCT, possible injection   3. Exudative age related macular degeneration, OS  - OS with inactive disciform scar  - history of intravitreal injections OS with Dr. Zadie Rhine in 2017 and earlier -- pt reports stopping visits after the development of a corneal abrasion from a lid speculum.  - has not seen a retina specialist since 2017  - OS now with large area of central GA with overlying disciform scar  - OCT shows relatively stable disciform scar in comparison to 2017 OCT -- no IRF/SRF  - BCVA CF OS - No retinal intervention recommended at this time  - monitor  4,5. Hypertensive retinopathy OU - discussed importance of tight BP control - monitor  6. Mixed Cataract OU - The symptoms of cataract, surgical options, and treatments and risks were discussed with patient. - discussed diagnosis and progression - approaching visual significance - monitor  Ophthalmic Meds Ordered this visit:  Meds ordered this encounter  Medications  . Bevacizumab (AVASTIN) SOLN 1.25 mg      Return in about 4 weeks (around 04/12/2021) for DFE, OCT.  There are no Patient Instructions on file for this visit.  This document serves as a record of services personally performed by Gardiner Sleeper, MD, PhD.  It was created on their behalf by Estill Bakes, [place] an ophthalmic technician. The creation of this record is the provider's dictation and/or activities during the visit.    Electronically signed by: Estill Bakes, COT 3.21.22 @ 1:52 PM   This document serves as a record of services personally performed by Gardiner Sleeper, MD, PhD. It was created on their behalf by Roselee Nova, COMT. The creation of this record is the provider's dictation and/or activities during the visit.  Electronically signed by: Roselee Nova, COMT 03/15/21 1:52 PM  Gardiner Sleeper, M.D., Ph.D. Diseases & Surgery of the Retina and [place] 03/15/2021   I have  reviewed the above documentation for accuracy and completeness, and I agree with the above. Gardiner Sleeper, M.D., Ph.D. 03/15/21 1:52 PM  Abbreviations: M myopia (nearsighted); A astigmatism; H hyperopia (farsighted); P presbyopia; Mrx spectacle prescription;  CTL contact lenses; OD right eye; OS left eye; OU both eyes  XT exotropia; ET esotropia; PEK punctate epithelial keratitis; PEE punctate epithelial erosions; DES dry eye syndrome; MGD meibomian gland dysfunction; ATs artificial tears; PFAT's preservative free artificial tears; [place] nuclear sclerotic cataract; PSC posterior subcapsular cataract; ERM epi-retinal membrane; PVD posterior vitreous detachment; RD retinal detachment; DM diabetes mellitus; DR diabetic retinopathy; NPDR non-proliferative diabetic retinopathy; PDR proliferative diabetic retinopathy; CSME clinically significant macular edema; DME diabetic macular edema; dbh dot blot hemorrhages; CWS cotton wool spot; POAG primary open angle glaucoma; C/D cup-to-disc ratio; HVF humphrey visual field; GVF goldmann visual field; OCT optical coherence tomography; IOP intraocular pressure; BRVO Branch retinal vein occlusion; CRVO central retinal vein occlusion; CRAO central retinal artery occlusion; BRAO branch retinal artery occlusion; RT retinal tear; SB scleral buckle; PPV pars plana vitrectomy; VH Vitreous hemorrhage; PRP panretinal laser photocoagulation; IVK intravitreal kenalog; VMT vitreomacular traction; MH Macular hole;  NVD neovascularization of the disc; NVE neovascularization elsewhere; AREDS age related eye disease study; ARMD age related macular degeneration; POAG primary open angle glaucoma; EBMD epithelial/anterior basement membrane dystrophy; ACIOL anterior chamber intraocular lens; IOL intraocular lens; PCIOL posterior chamber intraocular lens; Phaco/IOL phacoemulsification with intraocular lens placement; [place] photorefractive keratectomy; LASIK laser assisted in situ keratomileusis; HTN  hypertension; DM diabetes mellitus; COPD chronic obstructive pulmonary disease

## 2021-03-15 ENCOUNTER — Other Ambulatory Visit: Payer: Self-pay

## 2021-03-15 ENCOUNTER — Encounter (INDEPENDENT_AMBULATORY_CARE_PROVIDER_SITE_OTHER): Payer: Self-pay | Admitting: Ophthalmology

## 2021-03-15 ENCOUNTER — Ambulatory Visit (INDEPENDENT_AMBULATORY_CARE_PROVIDER_SITE_OTHER): Payer: Medicare Other | Admitting: Ophthalmology

## 2021-03-15 DIAGNOSIS — H353223 Exudative age-related macular degeneration, left eye, with inactive scar: Secondary | ICD-10-CM

## 2021-03-15 DIAGNOSIS — H25813 Combined forms of age-related cataract, bilateral: Secondary | ICD-10-CM

## 2021-03-15 DIAGNOSIS — I10 Essential (primary) hypertension: Secondary | ICD-10-CM

## 2021-03-15 DIAGNOSIS — H35033 Hypertensive retinopathy, bilateral: Secondary | ICD-10-CM | POA: Diagnosis not present

## 2021-03-15 DIAGNOSIS — H3581 Retinal edema: Secondary | ICD-10-CM | POA: Diagnosis not present

## 2021-03-15 DIAGNOSIS — H353211 Exudative age-related macular degeneration, right eye, with active choroidal neovascularization: Secondary | ICD-10-CM

## 2021-03-15 MED ORDER — BEVACIZUMAB CHEMO INJECTION 1.25MG/0.05ML SYRINGE FOR KALEIDOSCOPE
1.2500 mg | INTRAVITREAL | Status: AC | PRN
  Administered 2021-03-15: 1.25 mg via INTRAVITREAL

## 2021-03-23 DIAGNOSIS — I700 Atherosclerosis of aorta: Secondary | ICD-10-CM | POA: Diagnosis not present

## 2021-03-23 DIAGNOSIS — Z0001 Encounter for general adult medical examination with abnormal findings: Secondary | ICD-10-CM | POA: Diagnosis not present

## 2021-03-23 DIAGNOSIS — F322 Major depressive disorder, single episode, severe without psychotic features: Secondary | ICD-10-CM | POA: Diagnosis not present

## 2021-03-23 DIAGNOSIS — E559 Vitamin D deficiency, unspecified: Secondary | ICD-10-CM | POA: Diagnosis not present

## 2021-03-23 DIAGNOSIS — E785 Hyperlipidemia, unspecified: Secondary | ICD-10-CM | POA: Diagnosis not present

## 2021-03-23 DIAGNOSIS — J439 Emphysema, unspecified: Secondary | ICD-10-CM | POA: Diagnosis not present

## 2021-03-23 DIAGNOSIS — C50919 Malignant neoplasm of unspecified site of unspecified female breast: Secondary | ICD-10-CM | POA: Diagnosis not present

## 2021-03-23 DIAGNOSIS — G4700 Insomnia, unspecified: Secondary | ICD-10-CM | POA: Diagnosis not present

## 2021-03-23 DIAGNOSIS — I509 Heart failure, unspecified: Secondary | ICD-10-CM | POA: Diagnosis not present

## 2021-03-23 DIAGNOSIS — E039 Hypothyroidism, unspecified: Secondary | ICD-10-CM | POA: Diagnosis not present

## 2021-03-23 DIAGNOSIS — K219 Gastro-esophageal reflux disease without esophagitis: Secondary | ICD-10-CM | POA: Diagnosis not present

## 2021-03-23 DIAGNOSIS — Z79899 Other long term (current) drug therapy: Secondary | ICD-10-CM | POA: Diagnosis not present

## 2021-03-31 DIAGNOSIS — F322 Major depressive disorder, single episode, severe without psychotic features: Secondary | ICD-10-CM | POA: Diagnosis not present

## 2021-03-31 DIAGNOSIS — J45998 Other asthma: Secondary | ICD-10-CM | POA: Diagnosis not present

## 2021-03-31 DIAGNOSIS — I10 Essential (primary) hypertension: Secondary | ICD-10-CM | POA: Diagnosis not present

## 2021-03-31 DIAGNOSIS — J439 Emphysema, unspecified: Secondary | ICD-10-CM | POA: Diagnosis not present

## 2021-03-31 DIAGNOSIS — E039 Hypothyroidism, unspecified: Secondary | ICD-10-CM | POA: Diagnosis not present

## 2021-03-31 DIAGNOSIS — J441 Chronic obstructive pulmonary disease with (acute) exacerbation: Secondary | ICD-10-CM | POA: Diagnosis not present

## 2021-03-31 DIAGNOSIS — G4700 Insomnia, unspecified: Secondary | ICD-10-CM | POA: Diagnosis not present

## 2021-03-31 DIAGNOSIS — I509 Heart failure, unspecified: Secondary | ICD-10-CM | POA: Diagnosis not present

## 2021-03-31 DIAGNOSIS — E785 Hyperlipidemia, unspecified: Secondary | ICD-10-CM | POA: Diagnosis not present

## 2021-03-31 DIAGNOSIS — K219 Gastro-esophageal reflux disease without esophagitis: Secondary | ICD-10-CM | POA: Diagnosis not present

## 2021-04-11 NOTE — Progress Notes (Shared)
[facility] Note  04/12/2021     CHIEF COMPLAINT Patient presents for No chief complaint on file.   HISTORY OF PRESENT ILLNESS: Kimberly Waters is a 75 y.o. female who presents to the clinic today for:    Referring physician: Josetta Huddle, MD [address] [place],  [place] [address]  HISTORICAL INFORMATION:   Selected notes from the MEDICAL RECORD NUMBER Referred by Dr. Marin Comment for eval of decreased VA OD.   CURRENT MEDICATIONS: No current outpatient medications on file. (Ophthalmic Drugs)   No current facility-administered medications for this visit. (Ophthalmic Drugs)   Current Outpatient Medications (Other)  Medication Sig  . albuterol (PROVENTIL,VENTOLIN) 90 MCG/ACT inhaler Inhale 2 puffs into the lungs every 4 (four) hours as needed.  Marland Kitchen albuterol (VENTOLIN HFA) 108 (90 Base) MCG/ACT inhaler Inhale 2 puffs into the lungs every 4 (four) hours as needed.  Marland Kitchen anastrozole (ARIMIDEX) 1 MG tablet Take 1 tablet (1 mg total) by mouth daily.  Marland Kitchen aspirin EC 81 MG EC tablet Take 1 tablet (81 mg total) by mouth daily.  . betamethasone dipropionate 2.22 % cream 1 application  . Budeson-Glycopyrrol-Formoterol (BREZTRI AEROSPHERE) 160-9-4.8 MCG/ACT AERO Inhale 2 puffs into the lungs 2 (two) times daily.  . budesonide-formoterol (SYMBICORT) 80-4.5 MCG/ACT inhaler 2 puffs  . buPROPion (WELLBUTRIN SR) 150 MG 12 hr tablet Take 150 mg by mouth daily.   . clobetasol ointment (TEMOVATE) 0.05 % as needed.  . diphenhydrAMINE (BENADRYL) 25 MG tablet Take 25 mg by mouth as needed for sleep.  Marland Kitchen escitalopram (LEXAPRO) 10 MG tablet Take 1 tablet (10 mg total) by mouth daily.  Marland Kitchen ezetimibe (ZETIA) 10 MG tablet Take 1 tablet (10 mg total) by mouth daily.  . furosemide (LASIX) 40 MG tablet Take 1 tablet (40 mg total) by mouth daily.  . hydrOXYzine (ATARAX/VISTARIL) 25 MG tablet Take 25 mg by mouth at bedtime.  Marland Kitchen imipramine (TOFRANIL) 50 MG tablet Take 50 mg by mouth at  bedtime.  Marland Kitchen ipratropium (ATROVENT) 0.06 % nasal spray Place 2 sprays into the nose 4 (four) times daily.  Marland Kitchen ketoconazole (NIZORAL) 2 % cream Apply 1 application topically daily.  Marland Kitchen levocetirizine (XYZAL) 5 MG tablet Take 5 mg by mouth as needed for allergies.  Marland Kitchen levothyroxine (SYNTHROID, LEVOTHROID) 112 MCG tablet TAKE ONE TABLET BY MOUTH ONCE DAILY  . lisinopril-hydrochlorothiazide (ZESTORETIC) 20-25 MG tablet Take 1 tablet by mouth daily.  . meloxicam (MOBIC) 7.5 MG tablet Take 7.5 mg by mouth at bedtime.  . mometasone (ELOCON) 0.1 % ointment Apply topically as needed.  . nitroGLYCERIN (NITROSTAT) 0.4 MG SL tablet as needed.  Marland Kitchen omeprazole (PRILOSEC) 20 MG capsule Take 20 mg by mouth daily.  . Potassium Chloride ER 20 MEQ TBCR 1 tablet with food  . potassium chloride SA (K-DUR,KLOR-CON) 20 MEQ tablet Take 1 tablet (20 mEq total) by mouth daily.  . predniSONE (DELTASONE) 10 MG tablet Take by mouth. (Patient not taking: Reported on 01/17/2021)  . rosuvastatin (CRESTOR) 20 MG tablet Take 1 tablet (20 mg total) by mouth daily.  . simvastatin (ZOCOR) 20 MG tablet Take 1 tablet by mouth every evening.  . tacrolimus (PROTOPIC) 0.1 % ointment Apply topically as needed.   No current facility-administered medications for this visit. (Other)      REVIEW OF SYSTEMS:    ALLERGIES Allergies  Allergen Reactions  . Latex     Other reaction(s): Other (See Comments) Tears skin.  . Adhesive [Tape]  Other (See Comments)    (skin tears)  Tolerates PAPER TAPE  . Codeine Nausea Only  . Other Nausea Only    Anesthesia--nausea     PAST MEDICAL HISTORY Past Medical History:  Diagnosis Date  . Allergy   . Breast cancer ([place])   . Colon polyps   . Depression   . Dyspnea    SEASONAL  . Family history of breast cancer   . Family history of colon cancer   . GERD (gastroesophageal reflux disease)   . History of radiation therapy 06/05/17-07/20/17   right chest wall 50.4 Gy in 28 fractions, right  axillary nodal region 45 Gy in 25 fractions  . Hyperlipidemia   . Hypertension   . Hypothyroidism   . Peripheral neuropathy 06/18/2019  . PONV (postoperative nausea and vomiting)    Past Surgical History:  Procedure Laterality Date  . ABDOMINAL HYSTERECTOMY    . KNEE SURGERY Right   . MASTECTOMY W/ SENTINEL NODE BIOPSY Right 03/05/2017   Procedure: BILATERAL TOTAL MASTECTOMIES WITH RIGHT SENTINEL LYMPH NODE BIOPSIES;  Surgeon: Excell Seltzer, MD;  Location: [facility];  Service: General;  Laterality: Right;  . SHOULDER SURGERY     BILAT    FAMILY HISTORY Family History  Problem Relation Age of Onset  . Heart disease Mother 19       stents  . Colon cancer Maternal Grandfather 69  . Colon cancer Maternal Aunt 32  . Colon cancer Maternal Uncle        dx in his 46s  . Breast cancer Paternal Aunt 29  . Breast cancer Cousin 45       paternal first cousin  . Lung cancer Paternal Grandfather   . Colon cancer Maternal Uncle   . Head & neck cancer Maternal Uncle        oral cancer    SOCIAL HISTORY Social History   Tobacco Use  . Smoking status: Former Smoker    Packs/day: 1.00    Years: 45.00    Pack years: 45.00    Types: Cigarettes    Quit date: 02/22/2017    Years since quitting: 4.1  . Smokeless tobacco: Never Used  Vaping Use  . Vaping Use: Never used  Substance Use Topics  . Alcohol use: No    Alcohol/week: 0.0 standard drinks  . Drug use: No         OPHTHALMIC EXAM:  Not recorded     IMAGING AND PROCEDURES  Imaging and Procedures for 04/12/2021           ASSESSMENT/PLAN:  No diagnosis found.  1,2. Exudative age related macular degeneration OD  - OD with active CNV  - pt initially presented w/ 2 wk history of decreased vision OD  - history of intravitreal injections OS with Dr. Zadie Rhine in 2017 and earlier -- pt reports stopping visits after the development of a corneal abrasion from a lid speculum.  - s/p IVA OD #1 (12.21.21), #2 (01.24.22), #3  (2.21.22), #4 (3.22.22)  - OCT shows interval improvement in IRF/SRF overlying shallow PED/CNVM  - BCVA OD - stable at 20/40-2             - Recommend IVA OD #5 today, 4.19.22  - pt wishes to be treated with IVA  - RBA of procedure discussed, questions answered  - informed consent obtained and signed  - see procedure note  - f/u in 4 wks -- DFE/OCT, possible injection   3. Exudative age related macular degeneration,  OS  - OS with inactive disciform scar  - history of intravitreal injections OS with Dr. Zadie Rhine in 2017 and earlier -- pt reports stopping visits after the development of a corneal abrasion from a lid speculum.  - has not seen a retina specialist since 2017  - OS now with large area of central GA with overlying disciform scar  - OCT shows relatively stable disciform scar in comparison to 2017 OCT -- no IRF/SRF  - BCVA CF OS - No retinal intervention recommended at this time  - monitor  4,5. Hypertensive retinopathy OU - discussed importance of tight BP control - monitor  6. Mixed Cataract OU - The symptoms of cataract, surgical options, and treatments and risks were discussed with patient. - discussed diagnosis and progression - approaching visual significance - monitor  Ophthalmic Meds Ordered this visit:  No orders of the defined types were placed in this encounter.     No follow-ups on file.  There are no Patient Instructions on file for this visit.  This document serves as a record of services personally performed by Gardiner Sleeper, MD, PhD. It was created on their behalf by Estill Bakes, COT an ophthalmic technician. The creation of this record is the provider's dictation and/or activities during the visit.    Electronically signed by: Estill Bakes, COT 4.18.22 @ 1:01 PM  Abbreviations: M myopia (nearsighted); A astigmatism; H hyperopia (farsighted); P presbyopia; Mrx spectacle prescription;  CTL contact lenses; OD right eye; OS left eye; OU both eyes   XT exotropia; ET esotropia; PEK punctate epithelial keratitis; PEE punctate epithelial erosions; DES dry eye syndrome; MGD meibomian gland dysfunction; ATs artificial tears; PFAT's preservative free artificial tears; Vincent nuclear sclerotic cataract; PSC posterior subcapsular cataract; ERM epi-retinal membrane; PVD posterior vitreous detachment; RD retinal detachment; DM diabetes mellitus; DR diabetic retinopathy; NPDR non-proliferative diabetic retinopathy; PDR proliferative diabetic retinopathy; CSME clinically significant macular edema; DME diabetic macular edema; dbh dot blot hemorrhages; CWS cotton wool spot; POAG primary open angle glaucoma; C/D cup-to-disc ratio; HVF humphrey visual field; GVF goldmann visual field; OCT optical coherence tomography; IOP intraocular pressure; BRVO Branch retinal vein occlusion; CRVO central retinal vein occlusion; CRAO central retinal artery occlusion; BRAO branch retinal artery occlusion; RT retinal tear; SB scleral buckle; PPV pars plana vitrectomy; VH Vitreous hemorrhage; PRP panretinal laser photocoagulation; IVK intravitreal kenalog; VMT vitreomacular traction; MH Macular hole;  NVD neovascularization of the disc; NVE neovascularization elsewhere; AREDS age related eye disease study; ARMD age related macular degeneration; POAG primary open angle glaucoma; EBMD epithelial/anterior basement membrane dystrophy; ACIOL anterior chamber intraocular lens; IOL intraocular lens; PCIOL posterior chamber intraocular lens; Phaco/IOL phacoemulsification with intraocular lens placement; [place] photorefractive keratectomy; LASIK laser assisted in situ keratomileusis; HTN hypertension; DM diabetes mellitus; COPD chronic obstructive pulmonary disease

## 2021-04-12 ENCOUNTER — Encounter (INDEPENDENT_AMBULATORY_CARE_PROVIDER_SITE_OTHER): Payer: Medicare Other | Admitting: Ophthalmology

## 2021-04-12 DIAGNOSIS — H35033 Hypertensive retinopathy, bilateral: Secondary | ICD-10-CM

## 2021-04-12 DIAGNOSIS — H3581 Retinal edema: Secondary | ICD-10-CM

## 2021-04-12 DIAGNOSIS — H353211 Exudative age-related macular degeneration, right eye, with active choroidal neovascularization: Secondary | ICD-10-CM

## 2021-04-12 DIAGNOSIS — H353223 Exudative age-related macular degeneration, left eye, with inactive scar: Secondary | ICD-10-CM

## 2021-04-12 DIAGNOSIS — H25813 Combined forms of age-related cataract, bilateral: Secondary | ICD-10-CM

## 2021-04-12 DIAGNOSIS — I10 Essential (primary) hypertension: Secondary | ICD-10-CM

## 2021-04-13 ENCOUNTER — Other Ambulatory Visit: Payer: Self-pay | Admitting: Cardiology

## 2021-04-13 ENCOUNTER — Other Ambulatory Visit: Payer: Self-pay | Admitting: Oncology

## 2021-04-17 ENCOUNTER — Other Ambulatory Visit: Payer: Self-pay | Admitting: Oncology

## 2021-04-18 NOTE — Progress Notes (Signed)
[facility] Note  04/19/2021     CHIEF COMPLAINT Patient presents for Retina Follow Up   HISTORY OF PRESENT ILLNESS: Kimberly Waters is a 75 y.o. female who presents to the clinic today for:   HPI    Retina Follow Up    Patient presents with  Wet AMD.  In right eye.  Duration of 5 weeks.  Since onset it is stable.  I, the attending physician,  performed the HPI with the patient and updated documentation appropriately.          Comments    5 week follow up ARMD OU- Vision is stable OU since last visit.        Last edited by Bernarda Caffey, MD on 04/19/2021 11:41 PM. (History)    pt is delayed to 5 weeks from 4 weeks due to having vertigo last week, she states her vision has not changed   Referring physician: Josetta Huddle, MD [address] [place],  [place] [address]  HISTORICAL INFORMATION:   Selected notes from the MEDICAL RECORD NUMBER Referred by Dr. Marin Comment for eval of decreased VA OD.   CURRENT MEDICATIONS: No current outpatient medications on file. (Ophthalmic Drugs)   No current facility-administered medications for this visit. (Ophthalmic Drugs)   Current Outpatient Medications (Other)  Medication Sig  . albuterol (PROVENTIL,VENTOLIN) 90 MCG/ACT inhaler Inhale 2 puffs into the lungs every 4 (four) hours as needed.  Marland Kitchen albuterol (VENTOLIN HFA) 108 (90 Base) MCG/ACT inhaler Inhale 2 puffs into the lungs every 4 (four) hours as needed.  Marland Kitchen anastrozole (ARIMIDEX) 1 MG tablet Take 1 tablet (1 mg total) by mouth daily.  Marland Kitchen aspirin EC 81 MG EC tablet Take 1 tablet (81 mg total) by mouth daily.  . betamethasone dipropionate 8.09 % cream 1 application  . Budeson-Glycopyrrol-Formoterol (BREZTRI AEROSPHERE) 160-9-4.8 MCG/ACT AERO Inhale 2 puffs into the lungs 2 (two) times daily.  Marland Kitchen buPROPion (WELLBUTRIN SR) 150 MG 12 hr tablet Take 150 mg by mouth daily.   . clobetasol ointment (TEMOVATE) 0.05 % as needed.  . diphenhydrAMINE (BENADRYL) 25  MG tablet Take 25 mg by mouth as needed for sleep.  Marland Kitchen escitalopram (LEXAPRO) 10 MG tablet Take 1 tablet (10 mg total) by mouth daily.  Marland Kitchen ezetimibe (ZETIA) 10 MG tablet Take 1 tablet (10 mg total) by mouth daily.  . furosemide (LASIX) 40 MG tablet Take 1 tablet (40 mg total) by mouth daily.  . hydrOXYzine (ATARAX/VISTARIL) 25 MG tablet Take 25 mg by mouth at bedtime.  Marland Kitchen imipramine (TOFRANIL) 50 MG tablet Take 50 mg by mouth at bedtime.  Marland Kitchen ipratropium (ATROVENT) 0.06 % nasal spray Place 2 sprays into the nose 4 (four) times daily.  Marland Kitchen ketoconazole (NIZORAL) 2 % cream APPLY TO AFFECTED AREA EVERY DAY  . levocetirizine (XYZAL) 5 MG tablet Take 5 mg by mouth as needed for allergies.  Marland Kitchen levothyroxine (SYNTHROID, LEVOTHROID) 112 MCG tablet TAKE ONE TABLET BY MOUTH ONCE DAILY  . lisinopril-hydrochlorothiazide (ZESTORETIC) 20-25 MG tablet Take 1 tablet by mouth daily.  . meloxicam (MOBIC) 7.5 MG tablet Take 7.5 mg by mouth at bedtime.  . mometasone (ELOCON) 0.1 % ointment Apply topically as needed.  . nitroGLYCERIN (NITROSTAT) 0.4 MG SL tablet as needed.  Marland Kitchen omeprazole (PRILOSEC) 20 MG capsule Take 20 mg by mouth daily.  . potassium chloride SA (K-DUR,KLOR-CON) 20 MEQ tablet Take 1 tablet (20 mEq total) by mouth daily.  . rosuvastatin (CRESTOR) 20 MG  tablet TAKE 1 TABLET BY MOUTH EVERY DAY  . simvastatin (ZOCOR) 20 MG tablet Take 1 tablet by mouth every evening.  . tacrolimus (PROTOPIC) 0.1 % ointment Apply topically as needed.  . budesonide-formoterol (SYMBICORT) 80-4.5 MCG/ACT inhaler 2 puffs (Patient not taking: Reported on 04/19/2021)  . Potassium Chloride ER 20 MEQ TBCR 1 tablet with food  . predniSONE (DELTASONE) 10 MG tablet Take by mouth. (Patient not taking: No sig reported)   No current facility-administered medications for this visit. (Other)      REVIEW OF SYSTEMS: ROS    Positive for: Gastrointestinal, Endocrine, Cardiovascular, Eyes, Respiratory, Psychiatric   Negative for:  Constitutional, Neurological, Skin, Genitourinary, Musculoskeletal, HENT, Allergic/Imm, Heme/Lymph   Last edited by Leonie Douglas, COA on 04/19/2021  1:43 PM. (History)       ALLERGIES Allergies  Allergen Reactions  . Latex     Other reaction(s): Other (See Comments) Tears skin.  . Adhesive [Tape] Other (See Comments)    (skin tears)  Tolerates PAPER TAPE  . Codeine Nausea Only  . Other Nausea Only    Anesthesia--nausea     PAST MEDICAL HISTORY Past Medical History:  Diagnosis Date  . Allergy   . Breast cancer ([place])   . Colon polyps   . Depression   . Dyspnea    SEASONAL  . Family history of breast cancer   . Family history of colon cancer   . GERD (gastroesophageal reflux disease)   . History of radiation therapy 06/05/17-07/20/17   right chest wall 50.4 Gy in 28 fractions, right axillary nodal region 45 Gy in 25 fractions  . Hyperlipidemia   . Hypertension   . Hypothyroidism   . Peripheral neuropathy 06/18/2019  . PONV (postoperative nausea and vomiting)    Past Surgical History:  Procedure Laterality Date  . ABDOMINAL HYSTERECTOMY    . KNEE SURGERY Right   . MASTECTOMY W/ SENTINEL NODE BIOPSY Right 03/05/2017   Procedure: BILATERAL TOTAL MASTECTOMIES WITH RIGHT SENTINEL LYMPH NODE BIOPSIES;  Surgeon: Excell Seltzer, MD;  Location: [facility];  Service: General;  Laterality: Right;  . SHOULDER SURGERY     BILAT    FAMILY HISTORY Family History  Problem Relation Age of Onset  . Heart disease Mother 27       stents  . Colon cancer Maternal Grandfather 61  . Colon cancer Maternal Aunt 72  . Colon cancer Maternal Uncle        dx in his 33s  . Breast cancer Paternal Aunt 54  . Breast cancer Cousin 92       paternal first cousin  . Lung cancer Paternal Grandfather   . Colon cancer Maternal Uncle   . Head & neck cancer Maternal Uncle        oral cancer    SOCIAL HISTORY Social History   Tobacco Use  . Smoking status: Former Smoker    Packs/day: 1.00     Years: 45.00    Pack years: 45.00    Types: Cigarettes    Quit date: 02/22/2017    Years since quitting: 4.1  . Smokeless tobacco: Never Used  Vaping Use  . Vaping Use: Never used  Substance Use Topics  . Alcohol use: No    Alcohol/week: 0.0 standard drinks  . Drug use: No         OPHTHALMIC EXAM:  Base Eye Exam    Visual Acuity (Snellen - Linear)      Right Left   Dist cc 20/50 -2  CF 2'   Dist ph cc NI NI   Correction: Glasses       Tonometry (Tonopen, 1:53 PM)      Right Left   Pressure 18 18       Pupils      Dark Light Shape React APD   Right 3 2 Round Brisk None   Left 3 2 Round Brisk None       Visual Fields (Counting fingers)      Left Right     Full   Restrictions Partial inner superior temporal, inferior temporal, superior nasal, inferior nasal deficiencies        Extraocular Movement      Right Left    Full Full       Neuro/Psych    Oriented x3: Yes   Mood/Affect: Normal       Dilation    Both eyes: 1.0% Mydriacyl, 2.5% Phenylephrine @ 1:53 PM        Slit Lamp and Fundus Exam    Slit Lamp Exam      Right Left   Lids/Lashes Dermatochalasis - upper lid Dermatochalasis - upper lid   Conjunctiva/Sclera nasal and temporal pinguecula nasal and temporal pinguecula   Cornea 2+ Punctate epithelial erosions, irregular epi, arcus, decreased TBUT 2+ Punctate epithelial erosions, irregular epi, arcus, decreased TBUT   Anterior Chamber deep, narrow temporal angle deep, narrow temporal angle   Iris Round and moderately dilated, mild anterior bowing Round and moderately dilated, mild anterior bowing   Lens 2-3+ Nuclear sclerosis with early brunescence, 2-3+ Cortical cataract 2-3+ Nuclear sclerosis with early brunescence, 2-3+ Cortical cataract   Vitreous Vitreous syneresis, Posterior vitreous detachment Vitreous syneresis, Posterior vitreous detachment       Fundus Exam      Right Left   Disc Pink and Sharp, Compact, mild PPA Mild Pallor, Sharp rim,  +elevation   C/D Ratio 0.0 0.1   Macula Blunted foveal reflex, +CNV with pigment clumping/ring, +heme and edema - improved -- SRF essentially resolved, RPE mottling and clumping, Drusen Blunted foveal reflex, large area of GA with disciform scar and subretinal fibrosis, central pigment clumping, no heme   Vessels attenuated, mild tortuousity attenuated, Tortuous   Periphery Attached, scattered reticular degeneration, paving stone degeneration Attached, scattered reticular degeneration, scattered paving stone degeneration        Refraction    Wearing Rx      Sphere Cylinder Axis   Right -6.50 +2.00 152   Left -5.25 +1.00 018   Type: SVL       Manifest Refraction      Sphere Cylinder Axis Dist VA   Right -6.50 +1.50 150 NI   Left              IMAGING AND PROCEDURES  Imaging and Procedures for 04/19/2021  OCT, Retina - OU - Both Eyes       Right Eye Quality was good. Central Foveal Thickness: 338. Progression has improved. Findings include abnormal foveal contour, subretinal hyper-reflective material, pigment epithelial detachment, choroidal neovascular membrane, no IRF, no SRF (Interval improvement in SRF overlying PED/SRHM).   Left Eye Quality was good. Central Foveal Thickness: 259. Progression has been stable. Findings include abnormal foveal contour, no IRF, no SRF, subretinal hyper-reflective material, outer retinal atrophy (large central disciform scar with ORA, Foveal notch, stable improvement in trace cystic changes and temporal edema).   Notes *Images captured and stored on drive  Diagnosis / Impression:  exu ARMD OU OD with active  CNV -- Interval improvement in SRF overlying PED/SRHM OS with inactive disciform scar with stable improvement in trace cystic changes and temporal edema  Clinical management:  See below  Abbreviations: NFP - Normal foveal profile. CME - cystoid macular edema. PED - pigment epithelial detachment. IRF - intraretinal fluid. SRF -  subretinal fluid. EZ - ellipsoid zone. ERM - epiretinal membrane. ORA - outer retinal atrophy. ORT - outer retinal tubulation. SRHM - subretinal hyper-reflective material. IRHM - intraretinal hyper-reflective material        Intravitreal Injection, Pharmacologic Agent - OD - Right Eye       Time Out 04/19/2021. 2:36 PM. Confirmed correct patient, procedure, site, and patient consented.   Anesthesia Topical anesthesia was used. Anesthetic medications included Lidocaine 2%, Proparacaine 0.5%.   Procedure Preparation included 5% betadine to ocular surface, eyelid speculum. A supplied needle was used.   Injection:  1.25 mg Bevacizumab (AVASTIN) 1.25mg /0.20mL SOLN   NDC: 96222-979-89, Lot: [id number] , Expiration date: 05/08/2021   Route: Intravitreal, Site: Right Eye, Waste: 0 mL  Post-op Post injection exam found visual acuity of at least counting fingers. The patient tolerated the procedure well. There were no complications. The patient received written and verbal post procedure care education. Post injection medications were not given.                 ASSESSMENT/PLAN:    ICD-10-CM   1. Exudative age-related macular degeneration of right eye with active choroidal neovascularization (HCC)  H35.3211 Intravitreal Injection, Pharmacologic Agent - OD - Right Eye    Bevacizumab (AVASTIN) SOLN 1.25 mg  2. Retinal edema  H35.81 OCT, Retina - OU - Both Eyes  3. Exudative age-related macular degeneration of left eye with inactive scar ([facility])  H35.3223   4. Essential hypertension  I10   5. Hypertensive retinopathy of both eyes  H35.033   6. Combined forms of age-related cataract of both eyes  H25.813     1,2. Exudative age related macular degeneration OD  - OD with active CNV  - pt initially presented w/ 2 wk history of decreased vision OD  - history of intravitreal injections OS with Dr. [address] in 2017 and earlier -- pt reports stopping visits after the development of a corneal  abrasion from a lid speculum.  - s/p IVA OD #1 (12.21.21), #2 (01.24.22), #3 (2.21.22), #4 (03.22.22)  - OCT shows interval improvement in SRF overlying shallow PED/CNVM  - BCVA OD - decreased to 20/50 from 20/40-2             - Recommend IVA OD #5 today, 04.26.22  - pt wishes to be treated with IVA  - RBA of procedure discussed, questions answered  - informed consent obtained and signed  - see procedure note  - f/u in 4-5 wks -- DFE/OCT, possible injection   3. Exudative age related macular degeneration, OS  - OS with inactive disciform scar  - history of intravitreal injections OS with Dr. 05.09.22 in 2017 and earlier -- pt reports stopping visits after the development of a corneal abrasion from a lid speculum.  - has not seen a retina specialist since 2017  - OS now with large area of central GA with overlying disciform scar  - OCT shows relatively stable disciform scar in comparison to 2017 OCT -- no IRF/SRF  - BCVA CF OS - No retinal intervention recommended at this time  - monitor  4,5. Hypertensive retinopathy OU - discussed importance of tight BP control - monitor  6. Mixed Cataract OU - The symptoms of cataract, surgical options, and treatments and risks were discussed with patient. - discussed diagnosis and progression - approaching visual significance - monitor  Ophthalmic Meds Ordered this visit:  Meds ordered this encounter  Medications  . Bevacizumab (AVASTIN) SOLN 1.25 mg      Return in about 4 weeks (around 05/17/2021) for f/u exu ARMD OD, DFE, OCT.  There are no Patient Instructions on file for this visit.  This document serves as a record of services personally performed by Gardiner Sleeper, MD, PhD. It was created on their behalf by Leeann Must, [place], an ophthalmic technician. The creation of this record is the provider's dictation and/or activities during the visit.    Electronically signed by: Leeann Must, COA @TODAY @ 11:46 PM   This document serves  as a record of services personally performed by Gardiner Sleeper, MD, PhD. It was created on their behalf by San Jetty. Owens Shark, OA an ophthalmic technician. The creation of this record is the provider's dictation and/or activities during the visit.    Electronically signed by: San Jetty. Marguerita Merles 04.26.2022 11:46 PM  Gardiner Sleeper, M.D., Ph.D. Diseases & Surgery of the Retina and [place] 04/19/2021   I have reviewed the above documentation for accuracy and completeness, and I agree with the above. Gardiner Sleeper, M.D., Ph.D. 04/19/21 11:46 PM   Abbreviations: M myopia (nearsighted); A astigmatism; H hyperopia (farsighted); P presbyopia; Mrx spectacle prescription;  CTL contact lenses; OD right eye; OS left eye; OU both eyes  XT exotropia; ET esotropia; PEK punctate epithelial keratitis; PEE punctate epithelial erosions; DES dry eye syndrome; MGD meibomian gland dysfunction; ATs artificial tears; PFAT's preservative free artificial tears; [place] nuclear sclerotic cataract; PSC posterior subcapsular cataract; ERM epi-retinal membrane; PVD posterior vitreous detachment; RD retinal detachment; DM diabetes mellitus; DR diabetic retinopathy; NPDR non-proliferative diabetic retinopathy; PDR proliferative diabetic retinopathy; CSME clinically significant macular edema; DME diabetic macular edema; dbh dot blot hemorrhages; CWS cotton wool spot; POAG primary open angle glaucoma; C/D cup-to-disc ratio; HVF humphrey visual field; GVF goldmann visual field; OCT optical coherence tomography; IOP intraocular pressure; BRVO Branch retinal vein occlusion; CRVO central retinal vein occlusion; CRAO central retinal artery occlusion; BRAO branch retinal artery occlusion; RT retinal tear; SB scleral buckle; PPV pars plana vitrectomy; VH Vitreous hemorrhage; PRP panretinal laser photocoagulation; IVK intravitreal kenalog; VMT vitreomacular traction; MH Macular hole;  NVD neovascularization of  the disc; NVE neovascularization elsewhere; AREDS age related eye disease study; ARMD age related macular degeneration; POAG primary open angle glaucoma; EBMD epithelial/anterior basement membrane dystrophy; ACIOL anterior chamber intraocular lens; IOL intraocular lens; PCIOL posterior chamber intraocular lens; Phaco/IOL phacoemulsification with intraocular lens placement; [place] photorefractive keratectomy; LASIK laser assisted in situ keratomileusis; HTN hypertension; DM diabetes mellitus; COPD chronic obstructive pulmonary disease

## 2021-04-19 ENCOUNTER — Telehealth: Payer: Self-pay | Admitting: Oncology

## 2021-04-19 ENCOUNTER — Other Ambulatory Visit: Payer: Self-pay

## 2021-04-19 ENCOUNTER — Encounter (INDEPENDENT_AMBULATORY_CARE_PROVIDER_SITE_OTHER): Payer: Self-pay | Admitting: Ophthalmology

## 2021-04-19 ENCOUNTER — Ambulatory Visit (INDEPENDENT_AMBULATORY_CARE_PROVIDER_SITE_OTHER): Payer: Medicare Other | Admitting: Ophthalmology

## 2021-04-19 DIAGNOSIS — H353223 Exudative age-related macular degeneration, left eye, with inactive scar: Secondary | ICD-10-CM

## 2021-04-19 DIAGNOSIS — H353211 Exudative age-related macular degeneration, right eye, with active choroidal neovascularization: Secondary | ICD-10-CM

## 2021-04-19 DIAGNOSIS — I10 Essential (primary) hypertension: Secondary | ICD-10-CM | POA: Diagnosis not present

## 2021-04-19 DIAGNOSIS — H25813 Combined forms of age-related cataract, bilateral: Secondary | ICD-10-CM | POA: Diagnosis not present

## 2021-04-19 DIAGNOSIS — H3581 Retinal edema: Secondary | ICD-10-CM | POA: Diagnosis not present

## 2021-04-19 DIAGNOSIS — H35033 Hypertensive retinopathy, bilateral: Secondary | ICD-10-CM

## 2021-04-19 MED ORDER — BEVACIZUMAB CHEMO INJECTION 1.25MG/0.05ML SYRINGE FOR KALEIDOSCOPE
1.2500 mg | INTRAVITREAL | Status: AC | PRN
  Administered 2021-04-19: 1.25 mg via INTRAVITREAL

## 2021-04-19 NOTE — Telephone Encounter (Signed)
R/s appts per 4/24 sch msg. Called pt, no answer. Left msg with appts dates and times.  

## 2021-05-01 ENCOUNTER — Other Ambulatory Visit: Payer: Self-pay | Admitting: Oncology

## 2021-05-02 ENCOUNTER — Telehealth: Payer: Self-pay | Admitting: Oncology

## 2021-05-02 NOTE — Telephone Encounter (Signed)
Rescheduled appointment per 05/08 schedule message. Left a detailed message. 

## 2021-05-09 DIAGNOSIS — I509 Heart failure, unspecified: Secondary | ICD-10-CM | POA: Diagnosis not present

## 2021-05-09 DIAGNOSIS — J45998 Other asthma: Secondary | ICD-10-CM | POA: Diagnosis not present

## 2021-05-09 DIAGNOSIS — G4700 Insomnia, unspecified: Secondary | ICD-10-CM | POA: Diagnosis not present

## 2021-05-09 DIAGNOSIS — J439 Emphysema, unspecified: Secondary | ICD-10-CM | POA: Diagnosis not present

## 2021-05-09 DIAGNOSIS — E785 Hyperlipidemia, unspecified: Secondary | ICD-10-CM | POA: Diagnosis not present

## 2021-05-09 DIAGNOSIS — E039 Hypothyroidism, unspecified: Secondary | ICD-10-CM | POA: Diagnosis not present

## 2021-05-09 DIAGNOSIS — M170 Bilateral primary osteoarthritis of knee: Secondary | ICD-10-CM | POA: Diagnosis not present

## 2021-05-09 DIAGNOSIS — I10 Essential (primary) hypertension: Secondary | ICD-10-CM | POA: Diagnosis not present

## 2021-05-09 DIAGNOSIS — K219 Gastro-esophageal reflux disease without esophagitis: Secondary | ICD-10-CM | POA: Diagnosis not present

## 2021-05-09 DIAGNOSIS — F322 Major depressive disorder, single episode, severe without psychotic features: Secondary | ICD-10-CM | POA: Diagnosis not present

## 2021-05-09 DIAGNOSIS — C50919 Malignant neoplasm of unspecified site of unspecified female breast: Secondary | ICD-10-CM | POA: Diagnosis not present

## 2021-05-20 NOTE — Progress Notes (Signed)
[facility] Note  05/24/2021     CHIEF COMPLAINT Patient presents for Retina Follow Up   HISTORY OF PRESENT ILLNESS: Kimberly Waters is a 75 y.o. female who presents to the clinic today for:  HPI    Retina Follow Up    Patient presents with  Wet AMD.  In right eye.  This started 4 weeks ago.  Since onset it is stable.  I, the attending physician,  performed the HPI with the patient and updated documentation appropriately.          Comments    Pt here for 4 week exu ARMD OD. Pt states no changes in vision, no ocular pain or discomfort.        Last edited by Bernarda Caffey, MD on 05/24/2021 12:59 PM. (History)    Pt states she cannot tell any difference in vision today   Referring physician: Josetta Huddle, MD [address] [place],  [place] [address]  HISTORICAL INFORMATION:   Selected notes from the MEDICAL RECORD NUMBER Referred by Dr. Marin Comment for eval of decreased VA OD.   CURRENT MEDICATIONS: No current outpatient medications on file. (Ophthalmic Drugs)   No current facility-administered medications for this visit. (Ophthalmic Drugs)   Current Outpatient Medications (Other)  Medication Sig  . albuterol (PROVENTIL,VENTOLIN) 90 MCG/ACT inhaler Inhale 2 puffs into the lungs every 4 (four) hours as needed.  Marland Kitchen albuterol (VENTOLIN HFA) 108 (90 Base) MCG/ACT inhaler Inhale 2 puffs into the lungs every 4 (four) hours as needed.  Marland Kitchen anastrozole (ARIMIDEX) 1 MG tablet Take 1 tablet (1 mg total) by mouth daily.  Marland Kitchen aspirin EC 81 MG EC tablet Take 1 tablet (81 mg total) by mouth daily.  . betamethasone dipropionate 3.01 % cream 1 application  . Budeson-Glycopyrrol-Formoterol (BREZTRI AEROSPHERE) 160-9-4.8 MCG/ACT AERO Inhale 2 puffs into the lungs 2 (two) times daily.  . budesonide-formoterol (SYMBICORT) 80-4.5 MCG/ACT inhaler 2 puffs (Patient not taking: Reported on 04/19/2021)  . buPROPion (WELLBUTRIN SR) 150 MG 12 hr tablet Take 150 mg by mouth  daily.   . clobetasol ointment (TEMOVATE) 0.05 % as needed.  . diphenhydrAMINE (BENADRYL) 25 MG tablet Take 25 mg by mouth as needed for sleep.  Marland Kitchen escitalopram (LEXAPRO) 10 MG tablet Take 1 tablet (10 mg total) by mouth daily.  Marland Kitchen ezetimibe (ZETIA) 10 MG tablet Take 1 tablet (10 mg total) by mouth daily.  . furosemide (LASIX) 40 MG tablet Take 1 tablet (40 mg total) by mouth daily.  . hydrOXYzine (ATARAX/VISTARIL) 25 MG tablet Take 25 mg by mouth at bedtime.  Marland Kitchen imipramine (TOFRANIL) 50 MG tablet Take 50 mg by mouth at bedtime.  Marland Kitchen ipratropium (ATROVENT) 0.06 % nasal spray Place 2 sprays into the nose 4 (four) times daily.  Marland Kitchen ketoconazole (NIZORAL) 2 % cream APPLY TO AFFECTED AREA EVERY DAY  . levocetirizine (XYZAL) 5 MG tablet Take 5 mg by mouth as needed for allergies.  Marland Kitchen levothyroxine (SYNTHROID, LEVOTHROID) 112 MCG tablet TAKE ONE TABLET BY MOUTH ONCE DAILY  . lisinopril-hydrochlorothiazide (ZESTORETIC) 20-25 MG tablet Take 1 tablet by mouth daily.  . meloxicam (MOBIC) 7.5 MG tablet Take 7.5 mg by mouth at bedtime.  . mometasone (ELOCON) 0.1 % ointment Apply topically as needed.  . nitroGLYCERIN (NITROSTAT) 0.4 MG SL tablet as needed.  Marland Kitchen omeprazole (PRILOSEC) 20 MG capsule Take 20 mg by mouth daily.  . Potassium Chloride ER 20 MEQ TBCR 1 tablet with food  . potassium  chloride SA (K-DUR,KLOR-CON) 20 MEQ tablet Take 1 tablet (20 mEq total) by mouth daily.  . predniSONE (DELTASONE) 10 MG tablet Take by mouth. (Patient not taking: No sig reported)  . rosuvastatin (CRESTOR) 20 MG tablet TAKE 1 TABLET BY MOUTH EVERY DAY  . simvastatin (ZOCOR) 20 MG tablet Take 1 tablet by mouth every evening.  . tacrolimus (PROTOPIC) 0.1 % ointment Apply topically as needed.   No current facility-administered medications for this visit. (Other)      REVIEW OF SYSTEMS: ROS    Positive for: Gastrointestinal, Endocrine, Cardiovascular, Eyes, Respiratory, Psychiatric   Negative for: Constitutional,  Neurological, Skin, Genitourinary, Musculoskeletal, HENT, Allergic/Imm, Heme/Lymph   Last edited by Kingsley Spittle, COT on 05/24/2021  9:54 AM. (History)       ALLERGIES Allergies  Allergen Reactions  . Latex     Other reaction(s): Other (See Comments) Tears skin.  . Adhesive [Tape] Other (See Comments)    (skin tears)  Tolerates PAPER TAPE  . Codeine Nausea Only  . Other Nausea Only    Anesthesia--nausea     PAST MEDICAL HISTORY Past Medical History:  Diagnosis Date  . Allergy   . Breast cancer ([place])   . Colon polyps   . Depression   . Dyspnea    SEASONAL  . Family history of breast cancer   . Family history of colon cancer   . GERD (gastroesophageal reflux disease)   . History of radiation therapy 06/05/17-07/20/17   right chest wall 50.4 Gy in 28 fractions, right axillary nodal region 45 Gy in 25 fractions  . Hyperlipidemia   . Hypertension   . Hypothyroidism   . Peripheral neuropathy 06/18/2019  . PONV (postoperative nausea and vomiting)    Past Surgical History:  Procedure Laterality Date  . ABDOMINAL HYSTERECTOMY    . KNEE SURGERY Right   . MASTECTOMY W/ SENTINEL NODE BIOPSY Right 03/05/2017   Procedure: BILATERAL TOTAL MASTECTOMIES WITH RIGHT SENTINEL LYMPH NODE BIOPSIES;  Surgeon: Excell Seltzer, MD;  Location: [facility];  Service: General;  Laterality: Right;  . SHOULDER SURGERY     BILAT    FAMILY HISTORY Family History  Problem Relation Age of Onset  . Heart disease Mother 76       stents  . Colon cancer Maternal Grandfather 65  . Colon cancer Maternal Aunt 8  . Colon cancer Maternal Uncle        dx in his 58s  . Breast cancer Paternal Aunt 59  . Breast cancer Cousin 52       paternal first cousin  . Lung cancer Paternal Grandfather   . Colon cancer Maternal Uncle   . Head & neck cancer Maternal Uncle        oral cancer    SOCIAL HISTORY Social History   Tobacco Use  . Smoking status: Former Smoker    Packs/day: 1.00    Years:  45.00    Pack years: 45.00    Types: Cigarettes    Quit date: 02/22/2017    Years since quitting: 4.2  . Smokeless tobacco: Never Used  Vaping Use  . Vaping Use: Never used  Substance Use Topics  . Alcohol use: No    Alcohol/week: 0.0 standard drinks  . Drug use: No         OPHTHALMIC EXAM:  Base Eye Exam    Visual Acuity (Snellen - Linear)      Right Left   Dist cc 20/40 -2 CF at 3'  Dist ph cc NI NI   Correction: Glasses       Tonometry (Tonopen, 9:59 AM)      Right Left   Pressure 16 18       Pupils      Dark Light Shape React APD   Right 3 2 Round Brisk None   Left 3 2 Round Brisk None       Visual Fields      Left Right     Full   Restrictions Partial inner superior temporal, inferior temporal, superior nasal, inferior nasal deficiencies        Extraocular Movement      Right Left    Full, Ortho Full, Ortho       Neuro/Psych    Oriented x3: Yes   Mood/Affect: Normal       Dilation    Both eyes: 1.0% Mydriacyl, 2.5% Phenylephrine @ 10:00 AM        Slit Lamp and Fundus Exam    Slit Lamp Exam      Right Left   Lids/Lashes Dermatochalasis - upper lid Dermatochalasis - upper lid   Conjunctiva/Sclera nasal and temporal pinguecula nasal and temporal pinguecula   Cornea 2+ Punctate epithelial erosions, irregular epi, arcus, decreased TBUT 2+ Punctate epithelial erosions, irregular epi, arcus, decreased TBUT   Anterior Chamber deep, narrow temporal angle deep, narrow temporal angle   Iris Round and moderately dilated, mild anterior bowing Round and moderately dilated, mild anterior bowing   Lens 2-3+ Nuclear sclerosis with early brunescence, 2-3+ Cortical cataract 2-3+ Nuclear sclerosis with early brunescence, 2-3+ Cortical cataract   Vitreous Vitreous syneresis, Posterior vitreous detachment Vitreous syneresis, Posterior vitreous detachment       Fundus Exam      Right Left   Disc Pink and Sharp, Compact, mild PPA Mild Pallor, Sharp rim, +elevation    C/D Ratio 0.0 0.1   Macula Blunted foveal reflex, +CNV with pigment clumping/ring, heme and edema - stably improved -- SRF essentially resolved, RPE mottling and clumping, [place] Blunted foveal reflex, large area of GA with disciform scar and subretinal fibrosis, central pigment clumping, punctate heme IT to fovea   Vessels attenuated, mild tortuousity attenuated, Tortuous   Periphery Attached, scattered reticular degeneration, paving stone degeneration Attached, scattered reticular degeneration, scattered paving stone degeneration        Refraction    Wearing Rx      Sphere Cylinder Axis   Right -6.50 +2.00 152   Left -5.25 +1.00 018   Type: SVL          IMAGING AND PROCEDURES  Imaging and Procedures for 05/24/2021  OCT, Retina - OU - Both Eyes       Right Eye Quality was good. Central Foveal Thickness: 347. Progression has been stable. Findings include abnormal foveal contour, subretinal hyper-reflective material, pigment epithelial detachment, choroidal neovascular membrane, no IRF, no SRF (stable improvement in SRF overlying PED/SRHM).   Left Eye Quality was good. Central Foveal Thickness: 273. Progression has been stable. Findings include abnormal foveal contour, no IRF, no SRF, subretinal hyper-reflective material, outer retinal atrophy, lamellar hole (large central disciform scar with ORA, Foveal notch, stable improvement in trace cystic changes and temporal edema).   Notes *Images captured and stored on drive  Diagnosis / Impression:  exu ARMD OU OD with active CNV -- stable improvement in SRF overlying PED/SRHM OS with inactive disciform scar with stable improvement in trace cystic changes and temporal edema  Clinical management:  See below  Abbreviations: NFP - Normal foveal profile. CME - cystoid macular edema. PED - pigment epithelial detachment. IRF - intraretinal fluid. SRF - subretinal fluid. EZ - ellipsoid zone. ERM - epiretinal membrane. ORA - outer  retinal atrophy. ORT - outer retinal tubulation. SRHM - subretinal hyper-reflective material. IRHM - intraretinal hyper-reflective material        Intravitreal Injection, Pharmacologic Agent - OD - Right Eye       Time Out 05/24/2021. 11:03 AM. Confirmed correct patient, procedure, site, and patient consented.   Anesthesia Topical anesthesia was used. Anesthetic medications included Lidocaine 2%, Proparacaine 0.5%.   Procedure Preparation included 5% betadine to ocular surface, eyelid speculum. A supplied needle was used.   Injection:  1.25 mg Bevacizumab (AVASTIN) 1.25mg /0.3mL SOLN   NDC: 01779-390-30, Lot: [id number], Expiration date: 07/04/2021   Route: Intravitreal, Site: Right Eye, Waste: 0 mL  Post-op Post injection exam found visual acuity of at least counting fingers. The patient tolerated the procedure well. There were no complications. The patient received written and verbal post procedure care education. Post injection medications were not given.        Intravitreal Injection, Pharmacologic Agent - OS - Left Eye       Time Out 05/24/2021. 11:12 AM. Confirmed correct patient, procedure, site, and patient consented.   Anesthesia Topical anesthesia was used. Anesthetic medications included Lidocaine 2%, Proparacaine 0.5%.   Procedure A supplied needle was used.   Injection:  1.25 mg Bevacizumab (AVASTIN) 1.25mg /0.69mL SOLN   NDC: H061816, Lot: [id number], Expiration date: 07/06/2021   Route: Intravitreal, Site: Left Eye, Waste: 0 mL  Post-op Post injection exam found visual acuity of at least counting fingers. The patient tolerated the procedure well. There were no complications. The patient received written and verbal post procedure care education.                 ASSESSMENT/PLAN:    ICD-10-CM   1. Exudative age-related macular degeneration of right eye with active choroidal neovascularization (HCC)  H35.3211 Intravitreal Injection, Pharmacologic  Agent - OD - Right Eye    Bevacizumab (AVASTIN) SOLN 1.25 mg  2. Retinal edema  H35.81 OCT, Retina - OU - Both Eyes  3. Exudative age-related macular degeneration of left eye with active choroidal neovascularization (HCC)  H35.3221 Intravitreal Injection, Pharmacologic Agent - OS - Left Eye    Bevacizumab (AVASTIN) SOLN 1.25 mg  4. Essential hypertension  I10   5. Hypertensive retinopathy of both eyes  H35.033   6. Combined forms of age-related cataract of both eyes  H25.813     1,2. Exudative age related macular degeneration OD  - OD with active CNV  - pt initially presented w/ 2 wk history of decreased vision OD  - history of intravitreal injections OS with Dr. Zadie Rhine in 2017 and earlier -- pt reports stopping visits after the development of a corneal abrasion from a lid speculum.  - s/p IVA OD #1 (12.21.21), #2 (01.24.22), #3 (2.21.22), #4 (03.22.22), #5 (4.26.22)  - OCT shows stable improvement in SRF overlying shallow PED/CNVM  - BCVA OD - improved to 20/40 from 20/50             - Recommend IVA OD #6 today, 05.31.22  - pt wishes to be treated with IVA  - RBA of procedure discussed, questions answered  - informed consent obtained and signed  - see procedure note  - f/u in 6 wks -- DFE/OCT, possible injection   3. Exudative age related macular degeneration, OS  -  OS with inactive disciform scar, but today with new focal IRH  - history of intravitreal injections OS with Dr. Zadie Rhine in 2017 and earlier -- pt reports stopping visits after the development of a corneal abrasion from a lid speculum.  - has not seen a retina specialist since 2017  - OS now with large area of central GA with overlying disciform scar  - OCT shows relatively stable disciform scar in comparison to 2017 OCT -- no IRF/SRF  - BCVA CF OS - recommend IVA OS #1 today for new focal IRH on exam - pt wishes to proceed  - RBA of procedure discussed, questions answered  - informed consent obtained and signed - see  procedure note - f/u 6 weeks, DFE, OCT  4,5. Hypertensive retinopathy OU - discussed importance of tight BP control - monitor  6. Mixed Cataract OU - The symptoms of cataract, surgical options, and treatments and risks were discussed with patient. - discussed diagnosis and progression - approaching visual significance - monitor  Ophthalmic Meds Ordered this visit:  Meds ordered this encounter  Medications  . Bevacizumab (AVASTIN) SOLN 1.25 mg  . Bevacizumab (AVASTIN) SOLN 1.25 mg      Return in about 6 weeks (around 07/05/2021).  There are no Patient Instructions on file for this visit.  This document serves as a record of services personally performed by Gardiner Sleeper, MD, PhD. It was created on their behalf by Estill Bakes, COT an ophthalmic technician. The creation of this record is the provider's dictation and/or activities during the visit.    Electronically signed by: Estill Bakes, COT 5.27.22 @ 1:02 PM   This document serves as a record of services personally performed by Gardiner Sleeper, MD, PhD. It was created on their behalf by San Jetty. Owens Shark, OA an ophthalmic technician. The creation of this record is the provider's dictation and/or activities during the visit.    Electronically signed by: San Jetty. [place], [place] 05.31.2022 1:02 PM  Gardiner Sleeper, M.D., Ph.D. Diseases & Surgery of the Retina and Vitreous [facility]  I have reviewed the above documentation for accuracy and completeness, and I agree with the above. Gardiner Sleeper, M.D., Ph.D. 05/24/21 1:02 PM   Abbreviations: M myopia (nearsighted); A astigmatism; H hyperopia (farsighted); P presbyopia; Mrx spectacle prescription;  CTL contact lenses; OD right eye; OS left eye; OU both eyes  XT exotropia; ET esotropia; PEK punctate epithelial keratitis; PEE punctate epithelial erosions; DES dry eye syndrome; MGD meibomian gland dysfunction; ATs artificial tears; PFAT's preservative free  artificial tears; [place] nuclear sclerotic cataract; PSC posterior subcapsular cataract; ERM epi-retinal membrane; PVD posterior vitreous detachment; RD retinal detachment; DM diabetes mellitus; DR diabetic retinopathy; NPDR non-proliferative diabetic retinopathy; PDR proliferative diabetic retinopathy; CSME clinically significant macular edema; DME diabetic macular edema; dbh dot blot hemorrhages; CWS cotton wool spot; POAG primary open angle glaucoma; C/D cup-to-disc ratio; HVF humphrey visual field; GVF goldmann visual field; OCT optical coherence tomography; IOP intraocular pressure; BRVO Branch retinal vein occlusion; CRVO central retinal vein occlusion; CRAO central retinal artery occlusion; BRAO branch retinal artery occlusion; RT retinal tear; SB scleral buckle; PPV pars plana vitrectomy; VH Vitreous hemorrhage; PRP panretinal laser photocoagulation; IVK intravitreal kenalog; VMT vitreomacular traction; MH Macular hole;  NVD neovascularization of the disc; NVE neovascularization elsewhere; AREDS age related eye disease study; ARMD age related macular degeneration; POAG primary open angle glaucoma; EBMD epithelial/anterior basement membrane dystrophy; ACIOL anterior chamber intraocular lens; IOL intraocular lens;  PCIOL posterior chamber intraocular lens; Phaco/IOL phacoemulsification with intraocular lens placement; [place] photorefractive keratectomy; LASIK laser assisted in situ keratomileusis; HTN hypertension; DM diabetes mellitus; COPD chronic obstructive pulmonary disease

## 2021-05-24 ENCOUNTER — Ambulatory Visit (INDEPENDENT_AMBULATORY_CARE_PROVIDER_SITE_OTHER): Payer: Medicare Other | Admitting: Ophthalmology

## 2021-05-24 ENCOUNTER — Encounter (INDEPENDENT_AMBULATORY_CARE_PROVIDER_SITE_OTHER): Payer: Self-pay | Admitting: Ophthalmology

## 2021-05-24 ENCOUNTER — Other Ambulatory Visit: Payer: Self-pay

## 2021-05-24 DIAGNOSIS — H35033 Hypertensive retinopathy, bilateral: Secondary | ICD-10-CM | POA: Diagnosis not present

## 2021-05-24 DIAGNOSIS — H25813 Combined forms of age-related cataract, bilateral: Secondary | ICD-10-CM | POA: Diagnosis not present

## 2021-05-24 DIAGNOSIS — H353223 Exudative age-related macular degeneration, left eye, with inactive scar: Secondary | ICD-10-CM

## 2021-05-24 DIAGNOSIS — I10 Essential (primary) hypertension: Secondary | ICD-10-CM

## 2021-05-24 DIAGNOSIS — H353211 Exudative age-related macular degeneration, right eye, with active choroidal neovascularization: Secondary | ICD-10-CM

## 2021-05-24 DIAGNOSIS — H353221 Exudative age-related macular degeneration, left eye, with active choroidal neovascularization: Secondary | ICD-10-CM

## 2021-05-24 DIAGNOSIS — H3581 Retinal edema: Secondary | ICD-10-CM | POA: Diagnosis not present

## 2021-05-24 MED ORDER — BEVACIZUMAB CHEMO INJECTION 1.25MG/0.05ML SYRINGE FOR KALEIDOSCOPE
1.2500 mg | INTRAVITREAL | Status: AC | PRN
  Administered 2021-05-24: 1.25 mg via INTRAVITREAL

## 2021-05-30 ENCOUNTER — Ambulatory Visit: Payer: Medicare Other | Admitting: Oncology

## 2021-05-30 ENCOUNTER — Other Ambulatory Visit: Payer: Self-pay | Admitting: Oncology

## 2021-05-30 ENCOUNTER — Other Ambulatory Visit: Payer: Medicare Other

## 2021-06-09 ENCOUNTER — Ambulatory Visit (INDEPENDENT_AMBULATORY_CARE_PROVIDER_SITE_OTHER): Payer: Medicare Other | Admitting: Adult Health

## 2021-06-09 ENCOUNTER — Ambulatory Visit (INDEPENDENT_AMBULATORY_CARE_PROVIDER_SITE_OTHER): Payer: Medicare Other

## 2021-06-09 ENCOUNTER — Other Ambulatory Visit: Payer: Self-pay

## 2021-06-09 ENCOUNTER — Ambulatory Visit: Payer: Medicare Other | Admitting: Pulmonary Disease

## 2021-06-09 ENCOUNTER — Encounter: Payer: Self-pay | Admitting: Adult Health

## 2021-06-09 VITALS — BP 150/84 | HR 94 | Temp 97.2°F | Ht 61.0 in | Wt 189.9 lb

## 2021-06-09 DIAGNOSIS — R5381 Other malaise: Secondary | ICD-10-CM

## 2021-06-09 DIAGNOSIS — R0600 Dyspnea, unspecified: Secondary | ICD-10-CM

## 2021-06-09 DIAGNOSIS — J449 Chronic obstructive pulmonary disease, unspecified: Secondary | ICD-10-CM

## 2021-06-09 DIAGNOSIS — R0609 Other forms of dyspnea: Secondary | ICD-10-CM

## 2021-06-09 DIAGNOSIS — J439 Emphysema, unspecified: Secondary | ICD-10-CM | POA: Diagnosis not present

## 2021-06-09 MED ORDER — STIOLTO RESPIMAT 2.5-2.5 MCG/ACT IN AERS: 2.0000 | INHALATION_SPRAY | Freq: Every day | RESPIRATORY_TRACT | 0 refills | Status: DC

## 2021-06-09 MED ORDER — STIOLTO RESPIMAT 2.5-2.5 MCG/ACT IN AERS: 2.0000 | INHALATION_SPRAY | Freq: Every day | RESPIRATORY_TRACT | 5 refills | Status: DC

## 2021-06-09 NOTE — Progress Notes (Signed)
@Patient  ID: Kimberly Waters, female    DOB: 1946/09/14, 75 y.o.   MRN: [id number]  Chief Complaint  Patient presents with   Follow-up    Referring provider: Josetta Huddle, MD  HPI: 75 year old female former smoker followed for moderate COPD Medical history significant for breast cancer status post double mastectomy, diastolic heart failure  TEST/EVENTS :  Test Results: Echo 04/2018>> EF 18-29%, grade 1 diastolic dysfunction PAP 29 mm Hg   PFT's 4/3//2019>> FVC: 1.62, 2.75, 58% FEV1 1.09, 2.07, 52% F/F Ratio: 67,76, 88% SVC 1.83,2.75,66% TLC 88% DLCO: 46%   HRCT 03/26/2018 IMPRESSION: 1. No definitive evidence of interstitial lung disease. 2. Mild pulmonary edema and small right pleural effusion. 3. Aortic atherosclerosis (ICD10-170.0). Coronary artery calcification. 4.  Emphysema (ICD10-J43.9). 5. Hepatic steatosis. 6. Left adrenal adenoma.  Chest CT 04/2020 - neg acute process.   06/09/2021 Follow up : COPD  Patient returns for a follow-up visit.  Last seen May 2021.  Patient says over the last year her breathing is doing about the same.  She has a sedentary lifestyle.  Gets winded with heavy activities.  She denies any flare of cough or wheezing.  Says she has a low activity tolerance.  She was previously on Spiriva and Symbicort but was unable to afford these because they were too expensive.  She was changed over to [facility] but this was also too expensive.  In the past she has applied for patient assistance but has been denied.  She is currently only using albuterol. She has to use a walker due to balance and gait issues. She also has visual changes due to macular degeneration.  And is very hard of hearing. She denies any chest pain orthopnea PND or increased leg swelling.   Allergies  Allergen Reactions   Latex     Other reaction(s): Other (See Comments) Tears skin.   Adhesive [Tape] Other (See Comments)    (skin tears)  Tolerates PAPER TAPE   Codeine Nausea  Only   Other Nausea Only    Anesthesia--nausea     Immunization History  Administered Date(s) Administered   Fluad Quad(high Dose 65+) 10/22/2019   Influenza Whole 12/03/2007, 12/16/2009   Influenza-Unspecified 09/25/2015   PFIZER(Purple Top)SARS-COV-2 Vaccination 03/23/2020, 04/13/2020   Pneumococcal Conjugate-13 09/17/2018   Pneumococcal Polysaccharide-23 01/11/2010   Td 02/28/2006    Past Medical History:  Diagnosis Date   Allergy    Breast cancer ([place])    Colon polyps    Depression    Dyspnea    SEASONAL   Family history of breast cancer    Family history of colon cancer    GERD (gastroesophageal reflux disease)    History of radiation therapy 06/05/17-07/20/17   right chest wall 50.4 Gy in 28 fractions, right axillary nodal region 45 Gy in 25 fractions   Hyperlipidemia    Hypertension    Hypothyroidism    Peripheral neuropathy 06/18/2019   PONV (postoperative nausea and vomiting)     Tobacco History: Social History   Tobacco Use  Smoking Status Former   Packs/day: 1.00   Years: 45.00   Pack years: 45.00   Types: Cigarettes   Quit date: 02/22/2017   Years since quitting: 4.2  Smokeless Tobacco Never   Counseling given: Not Answered   Outpatient Medications Prior to Visit  Medication Sig Dispense Refill   albuterol (PROVENTIL,VENTOLIN) 90 MCG/ACT inhaler Inhale 2 puffs into the lungs every 4 (four) hours as needed. 17 g 3   albuterol (VENTOLIN  HFA) 108 (90 Base) MCG/ACT inhaler Inhale 2 puffs into the lungs every 4 (four) hours as needed.     anastrozole (ARIMIDEX) 1 MG tablet TAKE 1 TABLET BY MOUTH EVERY DAY 90 tablet 4   aspirin EC 81 MG EC tablet Take 1 tablet (81 mg total) by mouth daily.     betamethasone dipropionate 5.85 % cream 1 application     budesonide-formoterol (SYMBICORT) 80-4.5 MCG/ACT inhaler      buPROPion (WELLBUTRIN SR) 150 MG 12 hr tablet Take 150 mg by mouth daily.      clobetasol ointment (TEMOVATE) 0.05 % as needed.      diphenhydrAMINE (BENADRYL) 25 MG tablet Take 25 mg by mouth as needed for sleep.     escitalopram (LEXAPRO) 10 MG tablet Take 1 tablet (10 mg total) by mouth daily. 30 tablet 0   ezetimibe (ZETIA) 10 MG tablet Take 1 tablet (10 mg total) by mouth daily. 90 tablet 3   furosemide (LASIX) 40 MG tablet Take 1 tablet (40 mg total) by mouth daily. 30 tablet 0   hydrOXYzine (ATARAX/VISTARIL) 25 MG tablet Take 25 mg by mouth at bedtime.     imipramine (TOFRANIL) 50 MG tablet Take 50 mg by mouth at bedtime.     ipratropium (ATROVENT) 0.06 % nasal spray Place 2 sprays into the nose 4 (four) times daily. 15 mL 3   ketoconazole (NIZORAL) 2 % cream APPLY TO AFFECTED AREA EVERY DAY 15 g 3   levocetirizine (XYZAL) 5 MG tablet Take 5 mg by mouth as needed for allergies.     levothyroxine (SYNTHROID, LEVOTHROID) 112 MCG tablet TAKE ONE TABLET BY MOUTH ONCE DAILY 30 tablet 0   meloxicam (MOBIC) 7.5 MG tablet Take 7.5 mg by mouth at bedtime.     mometasone (ELOCON) 0.1 % ointment Apply topically as needed.     nitroGLYCERIN (NITROSTAT) 0.4 MG SL tablet as needed.     omeprazole (PRILOSEC) 20 MG capsule Take 20 mg by mouth daily.     Potassium Chloride ER 20 MEQ TBCR 1 tablet with food     potassium chloride SA (K-DUR,KLOR-CON) 20 MEQ tablet Take 1 tablet (20 mEq total) by mouth daily. 30 tablet 0   predniSONE (DELTASONE) 10 MG tablet Take by mouth.     rosuvastatin (CRESTOR) 20 MG tablet TAKE 1 TABLET BY MOUTH EVERY DAY 90 tablet 3   simvastatin (ZOCOR) 20 MG tablet Take 1 tablet by mouth every evening.     tacrolimus (PROTOPIC) 0.1 % ointment Apply topically as needed.     Budeson-Glycopyrrol-Formoterol (BREZTRI AEROSPHERE) 160-9-4.8 MCG/ACT AERO Inhale 2 puffs into the lungs 2 (two) times daily. 28 g 0   lisinopril-hydrochlorothiazide (ZESTORETIC) 20-25 MG tablet Take 1 tablet by mouth daily.     No facility-administered medications prior to visit.     Review of Systems:   Constitutional:   No  weight  loss, night sweats,  Fevers, chills,  +fatigue, or  lassitude.  HEENT:   No headaches,  Difficulty swallowing,  Tooth/dental problems, or  Sore throat,                No sneezing, itching, ear ache, nasal congestion, post nasal drip,   CV:  No chest pain,  Orthopnea, PND, swelling in lower extremities, anasarca, dizziness, palpitations, syncope.   GI  No heartburn, indigestion, abdominal pain, nausea, vomiting, diarrhea, change in bowel habits, loss of appetite, bloody stools.   Resp: .  No chest wall deformity  Skin: no  rash or lesions.  GU: no dysuria, change in color of urine, no urgency or frequency.  No flank pain, no hematuria   MS:  No joint pain or swelling.  No decreased range of motion.  No back pain.    Physical Exam  BP (!) 150/84 (BP Location: Left Arm, Patient Position: Sitting, Cuff Size: Normal)   Pulse 94   Temp (!) 97.2 F (36.2 C) (Temporal)   Ht 5\' 1"  (1.549 m)   Wt 189 lb 14.4 oz (86.1 kg)   SpO2 96%   BMI 35.88 kg/m   GEN: A/Ox3; pleasant , NAD, well nourished ,  rolling walker    HEENT:  [place]/AT,    NOSE-clear, THROAT-clear, no lesions, no postnasal drip or exudate noted.   NECK:  Supple w/ fair ROM; no JVD; normal carotid impulses w/o bruits; no thyromegaly or nodules palpated; no lymphadenopathy.    RESP  Clear  P & A; w/o, wheezes/ rales/ or rhonchi. no accessory muscle use, no dullness to percussion  CARD:  RRR, no m/r/g, tr  peripheral edema, pulses intact, no cyanosis or clubbing.  GI:   Soft & nt; nml bowel sounds; no organomegaly or masses detected.   Musco: Warm bil, no deformities or joint swelling noted.   Neuro: alert, no focal deficits noted.    Skin: Warm, no lesions or rashes    Lab Results:   BMET      PFT Results Latest Ref Rng & Units 03/28/2018  FVC-Pre L 1.62  FVC-Predicted Pre % 58  FVC-Post L 1.65  FVC-Predicted Post % 59  Pre FEV1/FVC % % 67  Post FEV1/FCV % % 70  FEV1-Pre L 1.09  FEV1-Predicted Pre % 52   FEV1-Post L 1.15  DLCO uncorrected ml/min/mmHg 10.10  DLCO [facility]% % 46  DLVA Predicted % 68  TLC L 4.21  TLC % Predicted % 88  RV % Predicted % 113    No results found for: NITRICOXIDE      Assessment & Plan:   COPD (chronic obstructive pulmonary disease) (HCC) COPD.  Unfortunately patient is not been able to have maintenance medication because her insurance will not cover.  We will try Stiolto and see if this has any better coverage.  Previously attempt on patient assistance patient has not qualified for.  Patient has been given a sample and prescription sent to the pharmacy.  Of asked her to contact me if this is not covered or affordable Consider changing to nebulizer form if Stiolto is not covered Check chest xray   Plan  Patient Instructions  Begin Stiolto 2 puffs daily , call back if not affordable .  Activity as tolerated.  Chest xray today  Follow up in 6-8 weeks with Dr. Chase Caller or Fuller Makin NP and As needed     '   Physical deconditioning Activity as tolerated.  Suspect this plays a big role in her ongoing fatigue, low activity tolerance and dyspnea    I spent  34 minutes minutes dedicated to the care of this patient on the date of this encounter to include pre-visit review of records, face-to-face time with the patient discussing conditions above, post visit ordering of testing, clinical documentation with the electronic health record, making appropriate referrals as documented, and communicating necessary findings to members of the patients care team.    Rexene Edison, NP 06/09/2021

## 2021-06-09 NOTE — Assessment & Plan Note (Signed)
Activity as tolerated.  Suspect this plays a big role in her ongoing fatigue, low activity tolerance and dyspnea

## 2021-06-09 NOTE — Assessment & Plan Note (Signed)
Chronic dyspnea with activity.  Patient has low activity tolerance.  She has multiple comorbidities and is on multiple medications. Will try maintenance medication for her COPD.  Exam does not show any significant volume overload.  Continue on current regimen.  Check chest x-ray  Plan  Patient Instructions  Begin Stiolto 2 puffs daily , call back if not affordable .  Activity as tolerated.  Chest xray today  Follow up in 6-8 weeks with Dr. Chase Caller or Myrical Andujo NP and As needed

## 2021-06-09 NOTE — Assessment & Plan Note (Signed)
COPD.  Unfortunately patient is not been able to have maintenance medication because her insurance will not cover.  We will try Stiolto and see if this has any better coverage.  Previously attempt on patient assistance patient has not qualified for.  Patient has been given a sample and prescription sent to the pharmacy.  Of asked her to contact me if this is not covered or affordable Consider changing to nebulizer form if Stiolto is not covered Check chest xray   Plan  Patient Instructions  Begin Stiolto 2 puffs daily , call back if not affordable .  Activity as tolerated.  Chest xray today  Follow up in 6-8 weeks with Dr. Chase Caller or Trenise Turay NP and As needed     '

## 2021-06-09 NOTE — Patient Instructions (Addendum)
Begin Stiolto 2 puffs daily , call back if not affordable .  Activity as tolerated.  Chest xray today  Follow up in 6-8 weeks with Dr. Chase Caller or Olly Shiner NP and As needed

## 2021-07-05 ENCOUNTER — Ambulatory Visit (INDEPENDENT_AMBULATORY_CARE_PROVIDER_SITE_OTHER): Payer: Medicare Other | Admitting: Ophthalmology

## 2021-07-05 ENCOUNTER — Encounter (INDEPENDENT_AMBULATORY_CARE_PROVIDER_SITE_OTHER): Payer: Self-pay | Admitting: Ophthalmology

## 2021-07-05 ENCOUNTER — Other Ambulatory Visit: Payer: Self-pay

## 2021-07-05 DIAGNOSIS — H25813 Combined forms of age-related cataract, bilateral: Secondary | ICD-10-CM

## 2021-07-05 DIAGNOSIS — H353221 Exudative age-related macular degeneration, left eye, with active choroidal neovascularization: Secondary | ICD-10-CM

## 2021-07-05 DIAGNOSIS — J45998 Other asthma: Secondary | ICD-10-CM | POA: Diagnosis not present

## 2021-07-05 DIAGNOSIS — E785 Hyperlipidemia, unspecified: Secondary | ICD-10-CM | POA: Diagnosis not present

## 2021-07-05 DIAGNOSIS — I10 Essential (primary) hypertension: Secondary | ICD-10-CM

## 2021-07-05 DIAGNOSIS — H353231 Exudative age-related macular degeneration, bilateral, with active choroidal neovascularization: Secondary | ICD-10-CM | POA: Diagnosis not present

## 2021-07-05 DIAGNOSIS — K219 Gastro-esophageal reflux disease without esophagitis: Secondary | ICD-10-CM | POA: Diagnosis not present

## 2021-07-05 DIAGNOSIS — F322 Major depressive disorder, single episode, severe without psychotic features: Secondary | ICD-10-CM | POA: Diagnosis not present

## 2021-07-05 DIAGNOSIS — H353211 Exudative age-related macular degeneration, right eye, with active choroidal neovascularization: Secondary | ICD-10-CM

## 2021-07-05 DIAGNOSIS — H35033 Hypertensive retinopathy, bilateral: Secondary | ICD-10-CM | POA: Diagnosis not present

## 2021-07-05 DIAGNOSIS — J439 Emphysema, unspecified: Secondary | ICD-10-CM | POA: Diagnosis not present

## 2021-07-05 DIAGNOSIS — I509 Heart failure, unspecified: Secondary | ICD-10-CM | POA: Diagnosis not present

## 2021-07-05 DIAGNOSIS — G4700 Insomnia, unspecified: Secondary | ICD-10-CM | POA: Diagnosis not present

## 2021-07-05 DIAGNOSIS — H3581 Retinal edema: Secondary | ICD-10-CM

## 2021-07-05 DIAGNOSIS — J441 Chronic obstructive pulmonary disease with (acute) exacerbation: Secondary | ICD-10-CM | POA: Diagnosis not present

## 2021-07-05 DIAGNOSIS — E039 Hypothyroidism, unspecified: Secondary | ICD-10-CM | POA: Diagnosis not present

## 2021-07-05 MED ORDER — BEVACIZUMAB CHEMO INJECTION 1.25MG/0.05ML SYRINGE FOR KALEIDOSCOPE
1.2500 mg | INTRAVITREAL | Status: AC | PRN
  Administered 2021-07-05: 1.25 mg via INTRAVITREAL

## 2021-07-05 NOTE — Progress Notes (Signed)
[facility] Note  07/05/2021     CHIEF COMPLAINT Patient presents for Retina Follow Up   HISTORY OF PRESENT ILLNESS: Kimberly Waters is a 75 y.o. female who presents to the clinic today for:  HPI     Retina Follow Up   Patient presents with  Wet AMD.  In right eye.  This started 6 weeks ago.  I, the attending physician,  performed the HPI with the patient and updated documentation appropriately.        Comments   Patient here for 6 weeks retina follow up for exu ARMD OD. Patient states vision doing ok. No eye pain.       Last edited by Bernarda Caffey, MD on 07/05/2021 10:54 PM.     Referring physician: Josetta Huddle, MD [address] [place],  [place] [address]  HISTORICAL INFORMATION:   Selected notes from the MEDICAL RECORD NUMBER Referred by Dr. Marin Comment for eval of decreased VA OD.   CURRENT MEDICATIONS: No current outpatient medications on file. (Ophthalmic Drugs)   No current facility-administered medications for this visit. (Ophthalmic Drugs)   Current Outpatient Medications (Other)  Medication Sig   albuterol (PROVENTIL,VENTOLIN) 90 MCG/ACT inhaler Inhale 2 puffs into the lungs every 4 (four) hours as needed.   albuterol (VENTOLIN HFA) 108 (90 Base) MCG/ACT inhaler Inhale 2 puffs into the lungs every 4 (four) hours as needed.   anastrozole (ARIMIDEX) 1 MG tablet TAKE 1 TABLET BY MOUTH EVERY DAY   aspirin EC 81 MG EC tablet Take 1 tablet (81 mg total) by mouth daily.   betamethasone dipropionate 0.93 % cream 1 application   budesonide-formoterol (SYMBICORT) 80-4.5 MCG/ACT inhaler    buPROPion (WELLBUTRIN SR) 150 MG 12 hr tablet Take 150 mg by mouth daily.    clobetasol ointment (TEMOVATE) 0.05 % as needed.   diphenhydrAMINE (BENADRYL) 25 MG tablet Take 25 mg by mouth as needed for sleep.   escitalopram (LEXAPRO) 10 MG tablet Take 1 tablet (10 mg total) by mouth daily.   ezetimibe (ZETIA) 10 MG tablet Take 1 tablet (10 mg  total) by mouth daily.   furosemide (LASIX) 40 MG tablet Take 1 tablet (40 mg total) by mouth daily.   hydrOXYzine (ATARAX/VISTARIL) 25 MG tablet Take 25 mg by mouth at bedtime.   imipramine (TOFRANIL) 50 MG tablet Take 50 mg by mouth at bedtime.   ipratropium (ATROVENT) 0.06 % nasal spray Place 2 sprays into the nose 4 (four) times daily.   ketoconazole (NIZORAL) 2 % cream APPLY TO AFFECTED AREA EVERY DAY   levocetirizine (XYZAL) 5 MG tablet Take 5 mg by mouth as needed for allergies.   levothyroxine (SYNTHROID, LEVOTHROID) 112 MCG tablet TAKE ONE TABLET BY MOUTH ONCE DAILY   meloxicam (MOBIC) 7.5 MG tablet Take 7.5 mg by mouth at bedtime.   mometasone (ELOCON) 0.1 % ointment Apply topically as needed.   nitroGLYCERIN (NITROSTAT) 0.4 MG SL tablet as needed.   omeprazole (PRILOSEC) 20 MG capsule Take 20 mg by mouth daily.   Potassium Chloride ER 20 MEQ TBCR 1 tablet with food   potassium chloride SA (K-DUR,KLOR-CON) 20 MEQ tablet Take 1 tablet (20 mEq total) by mouth daily.   predniSONE (DELTASONE) 10 MG tablet Take by mouth.   rosuvastatin (CRESTOR) 20 MG tablet TAKE 1 TABLET BY MOUTH EVERY DAY   simvastatin (ZOCOR) 20 MG tablet Take 1 tablet by mouth every evening.   tacrolimus (PROTOPIC) 0.1 % ointment Apply  topically as needed.   Tiotropium Bromide-Olodaterol (STIOLTO RESPIMAT) 2.5-2.5 MCG/ACT AERS Inhale 2 puffs into the lungs daily.   Tiotropium Bromide-Olodaterol (STIOLTO RESPIMAT) 2.5-2.5 MCG/ACT AERS Inhale 2 puffs into the lungs daily.   No current facility-administered medications for this visit. (Other)    REVIEW OF SYSTEMS: ROS   Positive for: Gastrointestinal, Endocrine, Cardiovascular, Eyes, Respiratory, Psychiatric Negative for: Constitutional, Neurological, Skin, Genitourinary, Musculoskeletal, HENT, Allergic/Imm, Heme/Lymph Last edited by Theodore Demark, COA on 07/05/2021  1:39 PM.        ALLERGIES Allergies  Allergen Reactions   Latex     Other reaction(s):  Other (See Comments) Tears skin.   Adhesive [Tape] Other (See Comments)    (skin tears)  Tolerates PAPER TAPE   Codeine Nausea Only   Other Nausea Only    Anesthesia--nausea     PAST MEDICAL HISTORY Past Medical History:  Diagnosis Date   Allergy    Breast cancer ([place])    Colon polyps    Depression    Dyspnea    SEASONAL   Family history of breast cancer    Family history of colon cancer    GERD (gastroesophageal reflux disease)    History of radiation therapy 06/05/17-07/20/17   right chest wall 50.4 Gy in 28 fractions, right axillary nodal region 45 Gy in 25 fractions   Hyperlipidemia    Hypertension    Hypothyroidism    Peripheral neuropathy 06/18/2019   PONV (postoperative nausea and vomiting)    Past Surgical History:  Procedure Laterality Date   ABDOMINAL HYSTERECTOMY     KNEE SURGERY Right    MASTECTOMY W/ SENTINEL NODE BIOPSY Right 03/05/2017   Procedure: BILATERAL TOTAL MASTECTOMIES WITH RIGHT SENTINEL LYMPH NODE BIOPSIES;  Surgeon: Excell Seltzer, MD;  Location: [facility] OR;  Service: General;  Laterality: Right;   SHOULDER SURGERY     BILAT    FAMILY HISTORY Family History  Problem Relation Age of Onset   Heart disease Mother 55       stents   Colon cancer Maternal Grandfather 59   Colon cancer Maternal Aunt 65   Colon cancer Maternal Uncle        dx in his 79s   Breast cancer Paternal Aunt 65   Breast cancer Cousin 2       paternal first cousin   Lung cancer Paternal Grandfather    Colon cancer Maternal Uncle    Head & neck cancer Maternal Uncle        oral cancer    SOCIAL HISTORY Social History   Tobacco Use   Smoking status: Former    Packs/day: 1.00    Years: 45.00    Pack years: 45.00    Types: Cigarettes    Quit date: 02/22/2017    Years since quitting: 4.3   Smokeless tobacco: Never  Vaping Use   Vaping Use: Never used  Substance Use Topics   Alcohol use: No    Alcohol/week: 0.0 standard drinks   Drug use: No          OPHTHALMIC EXAM:  Base Eye Exam     Visual Acuity (Snellen - Linear)       Right Left   Dist cc 20/40 -2 CF at 3'   Dist ph cc NI NI    Correction: Glasses         Tonometry (Tonopen, 1:37 PM)       Right Left   Pressure 15 17  Pupils       Dark Light Shape React APD   Right 3 2 Round Brisk None   Left 3 2 Round Brisk None         Visual Fields       Left Right   Restrictions Partial inner superior temporal, inferior temporal, superior nasal, inferior nasal deficiencies          Extraocular Movement       Right Left    Full Full         Neuro/Psych     Oriented x3: Yes   Mood/Affect: Normal         Dilation     Both eyes: 1.0% Mydriacyl, 2.5% Phenylephrine @ 1:37 PM           Slit Lamp and Fundus Exam     Slit Lamp Exam       Right Left   Lids/Lashes Dermatochalasis - upper lid Dermatochalasis - upper lid   Conjunctiva/Sclera nasal and temporal pinguecula nasal and temporal pinguecula   Cornea 2+ Punctate epithelial erosions, irregular epi, arcus, decreased TBUT 2+ Punctate epithelial erosions, irregular epi, arcus, decreased TBUT   Anterior Chamber deep, narrow temporal angle deep, narrow temporal angle   Iris Round and moderately dilated, mild anterior bowing Round and moderately dilated, mild anterior bowing   Lens 2-3+ Nuclear sclerosis with early brunescence, 2-3+ Cortical cataract 2-3+ Nuclear sclerosis with early brunescence, 2-3+ Cortical cataract   Vitreous Vitreous syneresis, Posterior vitreous detachment Vitreous syneresis, Posterior vitreous detachment         Fundus Exam       Right Left   Disc Pink and Sharp, Compact, mild PPA Mild Pallor, Sharp rim, +elevation   C/D Ratio 0.0 0.1   Macula Blunted foveal reflex, +CNV with pigment clumping/ring, heme stably improved, interval increase in SRF, RPE mottling and clumping, [place] Blunted foveal reflex, large area of GA with disciform scar and subretinal  fibrosis, central pigment clumping, punctate heme IT to fovea -- persistent   Vessels attenuated, mild tortuousity attenuated, Tortuous   Periphery Attached, scattered reticular degeneration, paving stone degeneration Attached, scattered reticular degeneration, scattered paving stone degeneration           Refraction     Wearing Rx       Sphere Cylinder Axis   Right -6.50 +2.00 152   Left -5.25 +1.00 018    Type: SVL            IMAGING AND PROCEDURES  Imaging and Procedures for 07/05/2021  OCT, Retina - OU - Both Eyes       Right Eye Quality was good. Central Foveal Thickness: 358. Progression has worsened. Findings include abnormal foveal contour, subretinal hyper-reflective material, pigment epithelial detachment, choroidal neovascular membrane, no IRF, subretinal fluid (Mild interval increase in SRF overlying PED/SRHM).   Left Eye Quality was good. Central Foveal Thickness: 267. Progression has been stable. Findings include abnormal foveal contour, no IRF, no SRF, subretinal hyper-reflective material, outer retinal atrophy, lamellar hole (large central disciform scar with ORA, Foveal notch, stable improvement in trace cystic changes and temporal edema).   Notes *Images captured and stored on drive  Diagnosis / Impression:  exu ARMD OU OD with active CNV -- Mild interval increase in SRF overlying PED/SRHM OS with inactive disciform scar with stable improvement in trace cystic changes and temporal edema  Clinical management:  See below  Abbreviations: NFP - Normal foveal profile. CME - cystoid macular edema. PED - pigment  epithelial detachment. IRF - intraretinal fluid. SRF - subretinal fluid. EZ - ellipsoid zone. ERM - epiretinal membrane. ORA - outer retinal atrophy. ORT - outer retinal tubulation. SRHM - subretinal hyper-reflective material. IRHM - intraretinal hyper-reflective material      Intravitreal Injection, Pharmacologic Agent - OD - Right Eye        Time Out 07/05/2021. 1:42 PM. Confirmed correct patient, procedure, site, and patient consented.   Anesthesia Topical anesthesia was used. Anesthetic medications included Lidocaine 2%, Proparacaine 0.5%.   Procedure Preparation included 5% betadine to ocular surface, eyelid speculum. A supplied needle was used.   Injection: 1.25 mg Bevacizumab 1.25mg /0.77ml   Route: Intravitreal, Site: Right Eye   NDC: H061816, Lot: [id number], Expiration date: 08/08/2021, Waste: 0 mL   Post-op Post injection exam found visual acuity of at least counting fingers. The patient tolerated the procedure well. There were no complications. The patient received written and verbal post procedure care education. Post injection medications were not given.      Intravitreal Injection, Pharmacologic Agent - OS - Left Eye       Time Out 07/05/2021. 1:42 PM. Confirmed correct patient, procedure, site, and patient consented.   Anesthesia Topical anesthesia was used. Anesthetic medications included Lidocaine 2%, Proparacaine 0.5%.   Procedure Preparation included eyelid speculum, 5% betadine to ocular surface. A (32g) needle was used.   Injection: 1.25 mg Bevacizumab 1.25mg /0.46ml   Route: Intravitreal, Site: Left Eye   NDC: H061816, Lot: [id number], Expiration date: 08/05/2021, Waste: 0.05 mL   Post-op Post injection exam found visual acuity of at least counting fingers. The patient tolerated the procedure well. There were no complications. The patient received written and verbal post procedure care education. Post injection medications were not given.               ASSESSMENT/PLAN:    ICD-10-CM   1. Exudative age-related macular degeneration of right eye with active choroidal neovascularization (HCC)  H35.3211 Intravitreal Injection, Pharmacologic Agent - OD - Right Eye    Bevacizumab (AVASTIN) SOLN 1.25 mg    2. Exudative age-related macular degeneration of left eye with active choroidal  neovascularization (HCC)  H35.3221 Intravitreal Injection, Pharmacologic Agent - OS - Left Eye    Bevacizumab (AVASTIN) SOLN 1.25 mg    3. Retinal edema  H35.81 OCT, Retina - OU - Both Eyes    4. Essential hypertension  I10     5. Hypertensive retinopathy of both eyes  H35.033     6. Combined forms of age-related cataract of both eyes  H25.813      1,2. Exudative age related macular degeneration OD  - OD with active CNV  - pt initially presented w/ 2 wk history of decreased vision OD  - history of intravitreal injections OS with Dr. Zadie Rhine in 2017 and earlier -- pt reports stopping visits after the development of a corneal abrasion from a lid speculum.  - s/p IVA OD #1 (12.21.21), #2 (01.24.22), #3 (2.21.22), #4 (03.22.22), #5 (4.26.22), #6 (5.31.22)  - OCT shows interval increase in SRF overlying shallow PED/CNVM at 6 weeks  - BCVA OD - improved to 20/40 from 20/50             - Recommend IVA OD #7 today, 7.12.22 with interval decrease back to 5 weeks  - pt wishes to be treated with IVA  - RBA of procedure discussed, questions answered  - informed consent obtained and signed  - see procedure note  - f/u in 5  wks -- DFE/OCT, possible injection   3. Exudative age related macular degeneration, OS  - OS with inactive disciform scar, but new focal IRH noted 5.31.22  - history of intravitreal injections OS with Dr. Zadie Rhine in 2017 and earlier -- pt reports stopping visits after the development of a corneal abrasion from a lid speculum.  - has not seen a retina specialist since 2017  - OS now with large area of central GA with overlying disciform scar  - OCT shows stable disciform scar -- no IRF/SRF  - BCVA CF OS             - s/p IVA OS #1 (05.31.22) - recommend IVA OS #2 today, 07.12.22 for persistent IRH - pt wishes to proceed  - RBA of procedure discussed, questions answered  - informed consent obtained and signed - see procedure note - f/u 5 weeks, DFE, OCT  4,5. Hypertensive  retinopathy OU - discussed importance of tight BP control - monitor  6. Mixed Cataract OU - The symptoms of cataract, surgical options, and treatments and risks were discussed with patient. - discussed diagnosis and progression - approaching visual significance - monitor  Ophthalmic Meds Ordered this visit:  Meds ordered this encounter  Medications   Bevacizumab (AVASTIN) SOLN 1.25 mg   Bevacizumab (AVASTIN) SOLN 1.25 mg       Return in about 5 weeks (around 08/09/2021) for f/u exu ARMD OU, DFE, OCT.  There are no Patient Instructions on file for this visit.  This document serves as a record of services personally performed by Gardiner Sleeper, MD, PhD. It was created on their behalf by Estill Bakes, COT an ophthalmic technician. The creation of this record is the provider's dictation and/or activities during the visit.    Electronically signed by: Estill Bakes, COT 7.12.22 @ 10:55 PM   This document serves as a record of services personally performed by Gardiner Sleeper, MD, PhD. It was created on their behalf by San Jetty. Owens Shark, OA an ophthalmic technician. The creation of this record is the provider's dictation and/or activities during the visit.    Electronically signed by: San Jetty. Marguerita Merles 07.12.2022 10:55 PM   Gardiner Sleeper, M.D., Ph.D. Diseases & Surgery of the Retina and Vitreous [facility]  I have reviewed the above documentation for accuracy and completeness, and I agree with the above. Gardiner Sleeper, M.D., Ph.D. 07/05/21 11:04 PM    Abbreviations: M myopia (nearsighted); A astigmatism; H hyperopia (farsighted); P presbyopia; Mrx spectacle prescription;  CTL contact lenses; OD right eye; OS left eye; OU both eyes  XT exotropia; ET esotropia; PEK punctate epithelial keratitis; PEE punctate epithelial erosions; DES dry eye syndrome; MGD meibomian gland dysfunction; ATs artificial tears; PFAT's preservative free artificial tears; [place]  nuclear sclerotic cataract; PSC posterior subcapsular cataract; ERM epi-retinal membrane; PVD posterior vitreous detachment; RD retinal detachment; DM diabetes mellitus; DR diabetic retinopathy; NPDR non-proliferative diabetic retinopathy; PDR proliferative diabetic retinopathy; CSME clinically significant macular edema; DME diabetic macular edema; dbh dot blot hemorrhages; CWS cotton wool spot; POAG primary open angle glaucoma; C/D cup-to-disc ratio; HVF humphrey visual field; GVF goldmann visual field; OCT optical coherence tomography; IOP intraocular pressure; BRVO Branch retinal vein occlusion; CRVO central retinal vein occlusion; CRAO central retinal artery occlusion; BRAO branch retinal artery occlusion; RT retinal tear; SB scleral buckle; PPV pars plana vitrectomy; VH Vitreous hemorrhage; PRP panretinal laser photocoagulation; IVK intravitreal kenalog; VMT vitreomacular traction; MH Macular hole;  NVD  neovascularization of the disc; NVE neovascularization elsewhere; AREDS age related eye disease study; ARMD age related macular degeneration; POAG primary open angle glaucoma; EBMD epithelial/anterior basement membrane dystrophy; ACIOL anterior chamber intraocular lens; IOL intraocular lens; PCIOL posterior chamber intraocular lens; Phaco/IOL phacoemulsification with intraocular lens placement; [place] photorefractive keratectomy; LASIK laser assisted in situ keratomileusis; HTN hypertension; DM diabetes mellitus; COPD chronic obstructive pulmonary disease

## 2021-07-11 ENCOUNTER — Ambulatory Visit: Payer: Self-pay | Admitting: Genetic Counselor

## 2021-07-12 ENCOUNTER — Other Ambulatory Visit: Payer: Self-pay

## 2021-07-12 ENCOUNTER — Inpatient Hospital Stay: Payer: Medicare Other | Attending: Oncology

## 2021-07-12 DIAGNOSIS — C50411 Malignant neoplasm of upper-outer quadrant of right female breast: Secondary | ICD-10-CM

## 2021-07-12 DIAGNOSIS — Z170 Estrogen receptor positive status [ER+]: Secondary | ICD-10-CM | POA: Insufficient documentation

## 2021-07-12 DIAGNOSIS — Z800 Family history of malignant neoplasm of digestive organs: Secondary | ICD-10-CM | POA: Diagnosis not present

## 2021-07-12 DIAGNOSIS — Z87891 Personal history of nicotine dependence: Secondary | ICD-10-CM | POA: Diagnosis not present

## 2021-07-12 DIAGNOSIS — M79621 Pain in right upper arm: Secondary | ICD-10-CM | POA: Diagnosis not present

## 2021-07-12 DIAGNOSIS — Z803 Family history of malignant neoplasm of breast: Secondary | ICD-10-CM | POA: Diagnosis not present

## 2021-07-12 DIAGNOSIS — Z79811 Long term (current) use of aromatase inhibitors: Secondary | ICD-10-CM | POA: Diagnosis not present

## 2021-07-12 DIAGNOSIS — Z90722 Acquired absence of ovaries, bilateral: Secondary | ICD-10-CM | POA: Diagnosis not present

## 2021-07-12 DIAGNOSIS — Z801 Family history of malignant neoplasm of trachea, bronchus and lung: Secondary | ICD-10-CM | POA: Diagnosis not present

## 2021-07-12 DIAGNOSIS — B372 Candidiasis of skin and nail: Secondary | ICD-10-CM | POA: Diagnosis not present

## 2021-07-12 DIAGNOSIS — Z8719 Personal history of other diseases of the digestive system: Secondary | ICD-10-CM | POA: Insufficient documentation

## 2021-07-12 DIAGNOSIS — Z884 Allergy status to anesthetic agent status: Secondary | ICD-10-CM | POA: Diagnosis not present

## 2021-07-12 DIAGNOSIS — Z8249 Family history of ischemic heart disease and other diseases of the circulatory system: Secondary | ICD-10-CM | POA: Diagnosis not present

## 2021-07-12 DIAGNOSIS — E8881 Metabolic syndrome: Secondary | ICD-10-CM

## 2021-07-12 DIAGNOSIS — Z9013 Acquired absence of bilateral breasts and nipples: Secondary | ICD-10-CM | POA: Insufficient documentation

## 2021-07-12 DIAGNOSIS — R232 Flushing: Secondary | ICD-10-CM | POA: Diagnosis not present

## 2021-07-12 DIAGNOSIS — D472 Monoclonal gammopathy: Secondary | ICD-10-CM | POA: Diagnosis not present

## 2021-07-12 DIAGNOSIS — C50111 Malignant neoplasm of central portion of right female breast: Secondary | ICD-10-CM | POA: Insufficient documentation

## 2021-07-12 DIAGNOSIS — R7303 Prediabetes: Secondary | ICD-10-CM | POA: Diagnosis not present

## 2021-07-12 DIAGNOSIS — G629 Polyneuropathy, unspecified: Secondary | ICD-10-CM | POA: Insufficient documentation

## 2021-07-12 DIAGNOSIS — Z79899 Other long term (current) drug therapy: Secondary | ICD-10-CM | POA: Diagnosis not present

## 2021-07-12 DIAGNOSIS — Z888 Allergy status to other drugs, medicaments and biological substances status: Secondary | ICD-10-CM | POA: Insufficient documentation

## 2021-07-12 DIAGNOSIS — Z885 Allergy status to narcotic agent status: Secondary | ICD-10-CM | POA: Diagnosis not present

## 2021-07-12 DIAGNOSIS — J449 Chronic obstructive pulmonary disease, unspecified: Secondary | ICD-10-CM | POA: Diagnosis not present

## 2021-07-12 LAB — CBC WITH DIFFERENTIAL/PLATELET
Abs Immature Granulocytes: 0.02 10*3/uL (ref 0.00–0.07)
Basophils Absolute: 0.1 10*3/uL (ref 0.0–0.1)
Basophils Relative: 1 %
Eosinophils Absolute: 0.2 10*3/uL (ref 0.0–0.5)
Eosinophils Relative: 2 %
HCT: 39 % (ref 36.0–46.0)
Hemoglobin: 13 g/dL (ref 12.0–15.0)
Immature Granulocytes: 0 %
Lymphocytes Relative: 21 %
Lymphs Abs: 1.6 10*3/uL (ref 0.7–4.0)
MCH: 29.1 pg (ref 26.0–34.0)
MCHC: 33.3 g/dL (ref 30.0–36.0)
MCV: 87.2 fL (ref 80.0–100.0)
Monocytes Absolute: 0.6 10*3/uL (ref 0.1–1.0)
Monocytes Relative: 7 %
Neutro Abs: 5.3 10*3/uL (ref 1.7–7.7)
Neutrophils Relative %: 69 %
Platelets: 284 10*3/uL (ref 150–400)
RBC: 4.47 MIL/uL (ref 3.87–5.11)
RDW: 13.1 % (ref 11.5–15.5)
WBC: 7.7 10*3/uL (ref 4.0–10.5)
nRBC: 0 % (ref 0.0–0.2)

## 2021-07-12 LAB — COMPREHENSIVE METABOLIC PANEL
ALT: 31 U/L (ref 0–44)
AST: 25 U/L (ref 15–41)
Albumin: 3.8 g/dL (ref 3.5–5.0)
Alkaline Phosphatase: 74 U/L (ref 38–126)
Anion gap: 12 (ref 5–15)
BUN: 20 mg/dL (ref 8–23)
CO2: 26 mmol/L (ref 22–32)
Calcium: 9.9 mg/dL (ref 8.9–10.3)
Chloride: 102 mmol/L (ref 98–111)
Creatinine, Ser: 1.6 mg/dL — ABNORMAL HIGH (ref 0.44–1.00)
GFR, Estimated: 34 mL/min — ABNORMAL LOW (ref >60)
Glucose, Bld: 119 mg/dL — ABNORMAL HIGH (ref 70–99)
Potassium: 3.9 mmol/L (ref 3.5–5.1)
Sodium: 140 mmol/L (ref 135–145)
Total Bilirubin: 0.4 mg/dL (ref 0.3–1.2)
Total Protein: 7.5 g/dL (ref 6.5–8.1)

## 2021-07-13 LAB — KAPPA/LAMBDA LIGHT CHAINS
Kappa free light chain: 21.3 mg/L — ABNORMAL HIGH (ref 3.3–19.4)
Kappa, lambda light chain ratio: 1.03 (ref 0.26–1.65)
Lambda free light chains: 20.7 mg/L (ref 5.7–26.3)

## 2021-07-14 ENCOUNTER — Ambulatory Visit: Payer: Medicare Other | Admitting: Oncology

## 2021-07-14 ENCOUNTER — Telehealth: Payer: Self-pay

## 2021-07-14 NOTE — Telephone Encounter (Signed)
-----   Message from Gardenia Phlegm, NP sent at 07/12/2021 11:20 AM EDT ----- Kidney function is slightly worse, other labs are still pending.  Please make sure Kimberly Waters is drinking enough water, not getting dehydrated and GM will f/u with her on 7/28 at scheduled to review the rest once it results.  ----- Message ----- From: Buel Ream, [facility] [place] Sent: 07/12/2021   9:25 AM EDT To: Chauncey Cruel, MD

## 2021-07-14 NOTE — Telephone Encounter (Signed)
Attempted to call pt to review results. LVM for to to return call.

## 2021-07-18 LAB — MULTIPLE MYELOMA PANEL, SERUM
Albumin SerPl Elph-Mcnc: 3.7 g/dL (ref 2.9–4.4)
Albumin/Glob SerPl: 1.2 (ref 0.7–1.7)
Alpha 1: 0.2 g/dL (ref 0.0–0.4)
Alpha2 Glob SerPl Elph-Mcnc: 1.1 g/dL — ABNORMAL HIGH (ref 0.4–1.0)
B-Globulin SerPl Elph-Mcnc: 1 g/dL (ref 0.7–1.3)
Gamma Glob SerPl Elph-Mcnc: 0.8 g/dL (ref 0.4–1.8)
Globulin, Total: 3.1 g/dL (ref 2.2–3.9)
IgA: 156 mg/dL (ref 64–422)
IgG (Immunoglobin G), Serum: 711 mg/dL (ref 586–1602)
IgM (Immunoglobulin M), Srm: 232 mg/dL — ABNORMAL HIGH (ref 26–217)
M Protein SerPl Elph-Mcnc: 0.3 g/dL — ABNORMAL HIGH
Total Protein ELP: 6.8 g/dL (ref 6.0–8.5)

## 2021-07-21 ENCOUNTER — Inpatient Hospital Stay (HOSPITAL_BASED_OUTPATIENT_CLINIC_OR_DEPARTMENT_OTHER): Payer: Medicare Other | Admitting: Oncology

## 2021-07-21 ENCOUNTER — Other Ambulatory Visit: Payer: Self-pay

## 2021-07-21 ENCOUNTER — Ambulatory Visit: Payer: Medicare Other | Admitting: Adult Health

## 2021-07-21 VITALS — BP 148/66 | HR 91 | Temp 97.7°F | Resp 18 | Wt 188.3 lb

## 2021-07-21 DIAGNOSIS — R232 Flushing: Secondary | ICD-10-CM | POA: Diagnosis not present

## 2021-07-21 DIAGNOSIS — B372 Candidiasis of skin and nail: Secondary | ICD-10-CM | POA: Diagnosis not present

## 2021-07-21 DIAGNOSIS — C50111 Malignant neoplasm of central portion of right female breast: Secondary | ICD-10-CM | POA: Diagnosis not present

## 2021-07-21 DIAGNOSIS — M79621 Pain in right upper arm: Secondary | ICD-10-CM | POA: Diagnosis not present

## 2021-07-21 DIAGNOSIS — Z170 Estrogen receptor positive status [ER+]: Secondary | ICD-10-CM

## 2021-07-21 DIAGNOSIS — C50411 Malignant neoplasm of upper-outer quadrant of right female breast: Secondary | ICD-10-CM | POA: Diagnosis not present

## 2021-07-21 DIAGNOSIS — J449 Chronic obstructive pulmonary disease, unspecified: Secondary | ICD-10-CM | POA: Diagnosis not present

## 2021-07-21 MED ORDER — KETOCONAZOLE 2 % EX CREA: 1.0000 "application " | TOPICAL_CREAM | Freq: Every day | CUTANEOUS | 6 refills | Status: DC

## 2021-07-21 MED ORDER — ANASTROZOLE 1 MG PO TABS: 1.0000 mg | ORAL_TABLET | Freq: Every day | ORAL | 4 refills | Status: DC

## 2021-07-21 MED ORDER — FLUCONAZOLE 100 MG PO TABS: 100.0000 mg | ORAL_TABLET | Freq: Every day | ORAL | 0 refills | Status: DC

## 2021-07-21 NOTE — Progress Notes (Addendum)
[place]  [phone] [phone]     ID: JOLENE GUYETT DOB: [birth year]  MR#: [id number]  NKN#:[id number]  Patient Care Team: Josetta Huddle, MD as PCP - General (Internal Medicine) Jerline Pain, MD as PCP - Cardiology (Cardiology) Juanita Craver, MD as Consulting Physician (Gastroenterology) Wladyslaw Henrichs, Virgie Dad, MD as Consulting Physician (Oncology) Excell Seltzer, MD (Inactive) as Consulting Physician (General Surgery) Gery Pray, MD as Consulting Physician (Radiation Oncology) Delice Bison, Charlestine Massed, NP as Nurse Practitioner (Hematology and Oncology) Kathrynn Ducking, MD as Consulting Physician (Neurology) Star Age, MD as Attending Physician (Neurology) Brand Males, MD as Consulting Physician (Pulmonary Disease) OTHER MD:  CHIEF COMPLAINT: Estrogen receptor positive breast cancer; status post bilateral mastectomies; MGUS  CURRENT TREATMENT: Anastrozole   INTERVAL HISTORY: Kalei returns today for follow-up of her estrogen receptor positive breast cancer and MGUS  She continues on anastrozole with good tolerance.  She has mild hot flashes which she considers "normal".  Arthralgias and myalgias are not an issue.  Chest x-ray obtained 06/09/2021 for follow-up of her history of COPD shows no acute changes.  She is followed by Dr. Chase Caller for this.  REVIEW OF SYSTEMS: Raihana is doing better as far as balance and falls is concerned since she started using her Rollator.  She is very proud of her 5 grandchildren 1 of whom graduated from [place] [employer] and recently got married.  She has occasional shooting pains in the right axilla which worries her.  She also "scratches a lot" which causes her skin problems.  She is not exercising at all.  A detailed review of systems today is otherwise stable  COVID 19 VACCINATION STATUS: Status post [place] x2 with booster 10/21/2020    BREAST CANCER HISTORY: From the original intake note:  Ashira  had an unremarkable mammogram 07/01/2012, after which she did not obtain further mammography. Sometime late 2017 she noted the right nipple was inverted. She brought this to medical attention and on 01/25/2017 she underwent bilateral mammography with tomography and right breast ultrasonography at [facility]. The breast density was category C. In the central right breast there was an area of architectural distortion associated with nipple retraction. Ultrasound confirmed a 2.2 cm irregular mass in the right breast central to the nipple. In addition there was a 2.5 cm irregular mass in the right breast superiorly. The right axilla was sonographically benign.  On 01/29/2017 she underwent biopsy of both these masses, and both showed morphologically similar invasive ductal carcinomas, grade 1 or 2, with evidence of lymphovascular invasion. Both tumors were 100% estrogen receptor positive with strong staining intensity, and 60-100% progesterone receptor positive, with strong staining intensity. The MIP-1 varied from 5-10%. Both tumors were HER-2 negative, with a signals ratio 1.12-1.39, and the number per cell 2.30-3.97.  The patient's subsequent history is as detailed below   PAST MEDICAL HISTORY: Past Medical History:  Diagnosis Date   Allergy    Breast cancer ([place])    Colon polyps    Depression    Dyspnea    SEASONAL   Family history of breast cancer    Family history of colon cancer    GERD (gastroesophageal reflux disease)    History of radiation therapy 06/05/17-07/20/17   right chest wall 50.4 Gy in 28 fractions, right axillary nodal region 45 Gy in 25 fractions   Hyperlipidemia    Hypertension    Hypothyroidism    Peripheral neuropathy 06/18/2019   PONV (postoperative nausea and vomiting)  PAST SURGICAL HISTORY: Past Surgical History:  Procedure Laterality Date   ABDOMINAL HYSTERECTOMY     KNEE SURGERY Right    MASTECTOMY W/ SENTINEL NODE BIOPSY Right 03/05/2017   Procedure: BILATERAL  TOTAL MASTECTOMIES WITH RIGHT SENTINEL LYMPH NODE BIOPSIES;  Surgeon: Excell Seltzer, MD;  Location: [facility] OR;  Service: General;  Laterality: Right;   SHOULDER SURGERY     BILAT    FAMILY HISTORY Family History  Problem Relation Age of Onset   Heart disease Mother 44       stents   Colon cancer Maternal Grandfather 59   Colon cancer Maternal Aunt 65   Colon cancer Maternal Uncle        dx in his 39s   Breast cancer Paternal Aunt 75   Breast cancer Cousin 18       paternal first cousin   Lung cancer Paternal Grandfather    Colon cancer Maternal Uncle    Head & neck cancer Maternal Uncle        oral cancer   The patient's father died at age 56. Her mother died at age 67 from "old age." The patient had no brothers, 1 sister. On the mother's side a grandfather, and an uncle all had colon cancer's. On the father's side there is an aunt and a cousin with breast cancer, both diagnosed in their late 28s. There is no history of ovarian cancer in the family.   GYNECOLOGIC HISTORY:  No LMP recorded. Patient has had a hysterectomy.  menarche age 35, first live birth age 89, the patient is [place] P2. She underwent total abdominal hysterectomy with bilateral salpingo-oophorectomy remotely and took hormone replacement for approximately 6 years.    SOCIAL HISTORY: (Updated October 2021). 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 Her husband [name] ("[address] Martinovich is retired from [employer]. Their son [name] is an Chief Financial Officer living in [place]. Their daughter [name] works at [employer] in [place]. The patient has 5 grandchildren. She is a Tourist information centre manager     ADVANCED DIRECTIVES: In the absence of any documentation to the contrary, the patient's spouse is their HCPOA.    HEALTH MAINTENANCE: Social History   Tobacco Use   Smoking status: Former    Packs/day: 1.00    Years: 45.00    Pack years: 45.00    Types: Cigarettes     Quit date: 02/22/2017    Years since quitting: 4.4   Smokeless tobacco: Never  Vaping Use   Vaping Use: Never used  Substance Use Topics   Alcohol use: No    Alcohol/week: 0.0 standard drinks   Drug use: No    Colonoscopy: 03/18/2014/[facility]  PAP: Status post hysterectomy  Bone density: Never    Allergies  Allergen Reactions   Latex     Other reaction(s): Other (See Comments) Tears skin.   Adhesive [Tape] Other (See Comments)    (skin tears)  Tolerates PAPER TAPE   Codeine Nausea Only   Other Nausea Only    Anesthesia--nausea     Current Outpatient Medications  Medication Sig Dispense Refill   fluconazole (DIFLUCAN) 100 MG tablet Take 1 tablet (100 mg total) by mouth daily. 5 tablet 0   ketoconazole (NIZORAL) 2 % cream Apply 1 application topically daily. 30 g 6   albuterol (PROVENTIL,VENTOLIN) 90 MCG/ACT inhaler Inhale 2 puffs into the lungs every 4 (four) hours as needed. 17 g 3   albuterol (VENTOLIN HFA) 108 (90  Base) MCG/ACT inhaler Inhale 2 puffs into the lungs every 4 (four) hours as needed.     anastrozole (ARIMIDEX) 1 MG tablet Take 1 tablet (1 mg total) by mouth daily. Stop May 2023 90 tablet 4   aspirin EC 81 MG EC tablet Take 1 tablet (81 mg total) by mouth daily.     betamethasone dipropionate 1.32 % cream 1 application     budesonide-formoterol (SYMBICORT) 80-4.5 MCG/ACT inhaler      buPROPion (WELLBUTRIN SR) 150 MG 12 hr tablet Take 150 mg by mouth daily.      clobetasol ointment (TEMOVATE) 0.05 % as needed.     diphenhydrAMINE (BENADRYL) 25 MG tablet Take 25 mg by mouth as needed for sleep.     escitalopram (LEXAPRO) 10 MG tablet Take 1 tablet (10 mg total) by mouth daily. 30 tablet 0   ezetimibe (ZETIA) 10 MG tablet Take 1 tablet (10 mg total) by mouth daily. 90 tablet 3   furosemide (LASIX) 40 MG tablet Take 1 tablet (40 mg total) by mouth daily. 30 tablet 0   hydrOXYzine (ATARAX/VISTARIL) 25 MG tablet Take 25 mg by mouth at bedtime.     imipramine (TOFRANIL)  50 MG tablet Take 50 mg by mouth at bedtime.     ipratropium (ATROVENT) 0.06 % nasal spray Place 2 sprays into the nose 4 (four) times daily. 15 mL 3   ketoconazole (NIZORAL) 2 % cream APPLY TO AFFECTED AREA EVERY DAY 15 g 3   levocetirizine (XYZAL) 5 MG tablet Take 5 mg by mouth as needed for allergies.     levothyroxine (SYNTHROID, LEVOTHROID) 112 MCG tablet TAKE ONE TABLET BY MOUTH ONCE DAILY 30 tablet 0   meloxicam (MOBIC) 7.5 MG tablet Take 7.5 mg by mouth at bedtime.     mometasone (ELOCON) 0.1 % ointment Apply topically as needed.     nitroGLYCERIN (NITROSTAT) 0.4 MG SL tablet as needed.     omeprazole (PRILOSEC) 20 MG capsule Take 20 mg by mouth daily.     Potassium Chloride ER 20 MEQ TBCR 1 tablet with food     potassium chloride SA (K-DUR,KLOR-CON) 20 MEQ tablet Take 1 tablet (20 mEq total) by mouth daily. 30 tablet 0   predniSONE (DELTASONE) 10 MG tablet Take by mouth.     rosuvastatin (CRESTOR) 20 MG tablet TAKE 1 TABLET BY MOUTH EVERY DAY 90 tablet 3   simvastatin (ZOCOR) 20 MG tablet Take 1 tablet by mouth every evening.     tacrolimus (PROTOPIC) 0.1 % ointment Apply topically as needed.     Tiotropium Bromide-Olodaterol (STIOLTO RESPIMAT) 2.5-2.5 MCG/ACT AERS Inhale 2 puffs into the lungs daily. 1 each 5   Tiotropium Bromide-Olodaterol (STIOLTO RESPIMAT) 2.5-2.5 MCG/ACT AERS Inhale 2 puffs into the lungs daily. 4 g 0   No current facility-administered medications for this visit.    OBJECTIVE:  white woman using a Rollator  Vitals:   07/21/21 1303  BP: (!) 148/66  Pulse: 91  Resp: 18  Temp: 97.7 F (36.5 C)  SpO2: 97%    Body mass index is 35.58 kg/m.    ECOG FS:1 - Symptomatic but completely ambulatory   Sclerae unicteric, EOMs intact Significant hearing loss No cervical or supraclavicular adenopathy Lungs no rales or rhonchi Heart regular rate and rhythm Abd soft, obese, nontender, positive bowel sounds MSK no focal spinal tenderness, no upper extremity  lymphedema Neuro: nonfocal, well oriented, friendly affect Breasts: Status post bilateral mastectomies.  There is no evidence of local recurrence.  Both axillae are benign. Skin: Bilateral yeast rash in skin folds in the armpits and also in the fold of the panniculus  LAB RESULTS:  CMP     Component Value Date/Time   NA 140 07/12/2021 0921   NA 141 05/12/2020 0938   NA 137 10/16/2017 1059   K 3.9 07/12/2021 0921   K 4.1 10/16/2017 1059   CL 102 07/12/2021 0921   CO2 26 07/12/2021 0921   CO2 25 10/16/2017 1059   GLUCOSE 119 (H) 07/12/2021 0921   GLUCOSE 113 10/16/2017 1059   BUN 20 07/12/2021 0921   BUN 11 05/12/2020 0938   BUN 20.3 10/16/2017 1059   CREATININE 1.60 (H) 07/12/2021 0921   CREATININE 1.23 (H) 07/17/2019 1417   CREATININE 1.2 (H) 10/16/2017 1059   CALCIUM 9.9 07/12/2021 0921   CALCIUM 9.3 10/16/2017 1059   PROT 7.5 07/12/2021 0921   PROT 7.2 03/03/2019 1542   PROT 7.2 10/16/2017 1059   ALBUMIN 3.8 07/12/2021 0921   ALBUMIN 4.7 03/03/2019 1542   ALBUMIN 3.5 10/16/2017 1059   AST 25 07/12/2021 0921   AST 16 07/17/2019 1417   AST 17 10/16/2017 1059   ALT 31 07/12/2021 0921   ALT 23 07/17/2019 1417   ALT 25 10/16/2017 1059   ALKPHOS 74 07/12/2021 0921   ALKPHOS 48 10/16/2017 1059   BILITOT 0.4 07/12/2021 0921   BILITOT 0.4 07/17/2019 1417   BILITOT 0.40 10/16/2017 1059   GFRNONAA 34 (L) 07/12/2021 0921   GFRNONAA 44 (L) 07/17/2019 1417   GFRAA 46 (L) 05/27/2020 0834   GFRAA 51 (L) 07/17/2019 1417    INo results found for: SPEP, UPEP  Lab Results  Component Value Date   WBC 7.7 07/12/2021   NEUTROABS 5.3 07/12/2021   HGB 13.0 07/12/2021   HCT 39.0 07/12/2021   MCV 87.2 07/12/2021   PLT 284 07/12/2021      Chemistry      Component Value Date/Time   NA 140 07/12/2021 0921   NA 141 05/12/2020 0938   NA 137 10/16/2017 1059   K 3.9 07/12/2021 0921   K 4.1 10/16/2017 1059   CL 102 07/12/2021 0921   CO2 26 07/12/2021 0921   CO2 25 10/16/2017  1059   BUN 20 07/12/2021 0921   BUN 11 05/12/2020 0938   BUN 20.3 10/16/2017 1059   CREATININE 1.60 (H) 07/12/2021 0921   CREATININE 1.23 (H) 07/17/2019 1417   CREATININE 1.2 (H) 10/16/2017 1059      Component Value Date/Time   CALCIUM 9.9 07/12/2021 0921   CALCIUM 9.3 10/16/2017 1059   ALKPHOS 74 07/12/2021 0921   ALKPHOS 48 10/16/2017 1059   AST 25 07/12/2021 0921   AST 16 07/17/2019 1417   AST 17 10/16/2017 1059   ALT 31 07/12/2021 0921   ALT 23 07/17/2019 1417   ALT 25 10/16/2017 1059   BILITOT 0.4 07/12/2021 0921   BILITOT 0.4 07/17/2019 1417   BILITOT 0.40 10/16/2017 1059       No results found for: LABCA2  No components found for: LABCA125  No results for input(s): INR in the last 168 hours.  Urinalysis No results found for: COLORURINE, APPEARANCEUR, LABSPEC, PHURINE, GLUCOSEU, HGBUR, BILIRUBINUR, KETONESUR, PROTEINUR, UROBILINOGEN, NITRITE, LEUKOCYTESUR   STUDIES: Intravitreal Injection, Pharmacologic Agent - OD - Right Eye  Result Date: 07/05/2021 Time Out 07/05/2021. 1:42 PM. Confirmed correct patient, procedure, site, and patient consented. Anesthesia Topical anesthesia was used. Anesthetic medications included Lidocaine 2%, Proparacaine 0.5%. Procedure Preparation included  5% betadine to ocular surface, eyelid speculum. A supplied needle was used. Injection: 1.25 mg Bevacizumab 1.71m/0.05ml   Route: Intravitreal, Site: Right Eye   NDC: 5H061816 Lot:: [id number] Expiration date: 08/08/2021, Waste: 0 mL Post-op Post injection exam found visual acuity of at least counting fingers. The patient tolerated the procedure well. There were no complications. The patient received written and verbal post procedure care education. Post injection medications were not given.   Intravitreal Injection, Pharmacologic Agent - OS - Left Eye  Result Date: 07/05/2021 Time Out 07/05/2021. 1:42 PM. Confirmed correct patient, procedure, site, and patient consented. Anesthesia Topical  anesthesia was used. Anesthetic medications included Lidocaine 2%, Proparacaine 0.5%. Procedure Preparation included eyelid speculum, 5% betadine to ocular surface. A (32g) needle was used. Injection: 1.25 mg Bevacizumab 1.252m0.05ml   Route: Intravitreal, Site: Left Eye   NDC: 50H061816Lot: : [id number] date: 08/05/2021, Waste: 0.05 mL Post-op Post injection exam found visual acuity of at least counting fingers. The patient tolerated the procedure well. There were no complications. The patient received written and verbal post procedure care education. Post injection medications were not given.   OCT, Retina - OU - Both Eyes  Result Date: 07/05/2021 Right Eye Quality was good. Central Foveal Thickness: 358. Progression has worsened. Findings include abnormal foveal contour, subretinal hyper-reflective material, pigment epithelial detachment, choroidal neovascular membrane, no IRF, subretinal fluid (Mild interval increase in SRF overlying PED/SRHM). Left Eye Quality was good. Central Foveal Thickness: 267. Progression has been stable. Findings include abnormal foveal contour, no IRF, no SRF, subretinal hyper-reflective material, outer retinal atrophy, lamellar hole (large central disciform scar with ORA, Foveal notch, stable improvement in trace cystic changes and temporal edema). Notes *Images captured and stored on drive Diagnosis / Impression: exu ARMD OU OD with active CNV -- Mild interval increase in SRF overlying PED/SRHM OS with inactive disciform scar with stable improvement in trace cystic changes and temporal edema Clinical management: See below Abbreviations: NFP - Normal foveal profile. CME - cystoid macular edema. PED - pigment epithelial detachment. IRF - intraretinal fluid. SRF - subretinal fluid. EZ - ellipsoid zone. ERM - epiretinal membrane. ORA - outer retinal atrophy. ORT - outer retinal tubulation. SRHM - subretinal hyper-reflective material. IRHM - intraretinal hyper-reflective  material     ELIGIBLE FOR AVAILABLE RESEARCH PROTOCOL: no  ASSESSMENT: [id number], [place]  Woman status post biopsy of 2 separate masses in the central right breast 01/29/2017, showing a clinical mT2 N0 invasive ductal carcinoma, stage IB in the new classification: Both masses being estrogen receptor and progesterone receptor positive, HER-2 not amplified, with an MIB-1 between 5 and 10%.  (1) status post bilateral mastectomies 03/05/2017 showing  (a) on the left, no malignancy  (b) On the right, a pT3 pN0, stage IIA invasive ductal carcinoma, grade 2,  with negative margins    (c) patient opted against reconstruction  (2) Oncotype score of 6 predicts a 10 year risk of recurrence outside the breast of 5% if the patient's only systemic therapy is tamoxifen for 5 years. It also predicts no benefit from chemotherapy.  (3) adjuvant radiation 06/05/17 - 07/20/17  Right Chest Wall/ 50.4 Gy in 28 fractions Right axillary Nodal region// 45 Gy in 25 fractions Mastectomy scar boost 10 gray in 5 fractions   (4) started anastrozole May 2018--held 07/26/2019 for evaluation of neuropathy  (a) [facility] showed a T score of -0.3  (b) repeat bone density to be scheduled October 2020  (c) anastrozole resumed  October 2020  (5) genetics testing through the Hereditary Gene Panel offered by [facility] found no deleterious mutations in APC, ATM, AXIN2, BARD1, BMPR1A, BRCA1, BRCA2, BRIP1, CDH1, CDKN2A (p14ARF), CDKN2A (p16INK4a), CHEK2, DICER1, EPCAM (Deletion/duplication testing only), GREM1 (promoter region deletion/duplication testing only), KIT, MEN1, MLH1, MSH2, MSH6, MUTYH, NBN, NF1, PALB2, PDGFRA, PMS2, POLD1, POLE, PTEN, RAD50, RAD51C, RAD51D, SDHB, SDHC, SDHD, SMAD4, SMARCA4. STK11, TP53, TSC1, TSC2, and VHL.  The following gene was evaluated for sequence changes only: SDHA and HOXB13 c.251G>A variant only  (a) 2 VUS were identified:  MSH6 c.3394G>C and TSC1 c.1481C>T  (6)  tobacco abuse: Patient quit smoking 03/03/2017  (7) M-GUS: Multiple myeloma panel obtained 03/03/2019 shows an M protein of 0.3, IgG lambda  (a) normal IgG, IgA, and IgM levels and normal kappa/lambda ratio 10/17/2019  (b) repeat M-spike 10/17/2019 was 0.2  (c) repeat M spike 05/27/2020 was 0.3  (8) neuropathy:  (a) labs 03/03/2019 show a hemoglobin A1c of 6.3 (prediabetes).  (b) brain MRI 07/07/2019 and lumbar MRI 08/04/2019 nondiagnostic   PLAN: Britteny is now a little over 4 years out from definitive surgery for her breast cancer with no evidence of disease recurrence.  This is very favorable.  She is tolerating anastrozole well and the plan is to continue that for total of 5 years.  We are also following her M spike, which is very stable.  There is no evidence of progression to myeloma.  I strongly encouraged her to start some type of exercise program, whether walking, chair exercises, or simply a stretching exercise like tai chi.  I wrote her for Diflucan and ketoconazole cream for her skin yeast problem  She will see [place] again April 2023.  She knows to call for any other issue that may develop before then  Total encounter time 25 minutes.Sarajane Jews C. Ulysse Siemen, MD  07/21/21 1:38 PM Medical Oncology and Hematology [facility], [place] [address] Tel. [phone]    Fax. [phone]   I, Wilburn Mylar, am acting as scribe for Dr. Virgie Dad. Lunetta Marina.  I, Lurline Del MD, have reviewed the above documentation for accuracy and completeness, and I agree with the above.   *Total Encounter Time as defined by the Centers for Medicare and Medicaid Services includes, in addition to the face-to-face time of a patient visit (documented in the note above) non-face-to-face time: obtaining and reviewing outside history, ordering and reviewing medications, tests or procedures, care coordination (communications with other health care professionals  or caregivers) and documentation in the medical record.

## 2021-07-25 ENCOUNTER — Ambulatory Visit (INDEPENDENT_AMBULATORY_CARE_PROVIDER_SITE_OTHER): Payer: Medicare Other | Admitting: Adult Health

## 2021-07-25 ENCOUNTER — Other Ambulatory Visit: Payer: Self-pay

## 2021-07-25 ENCOUNTER — Encounter: Payer: Self-pay | Admitting: Adult Health

## 2021-07-25 DIAGNOSIS — J449 Chronic obstructive pulmonary disease, unspecified: Secondary | ICD-10-CM

## 2021-07-25 DIAGNOSIS — R5381 Other malaise: Secondary | ICD-10-CM

## 2021-07-25 NOTE — Assessment & Plan Note (Signed)
Activity as tolerated

## 2021-07-25 NOTE — Patient Instructions (Signed)
Begin Kimberly Waters daily  Begin [employer] Twice daily   New order for nebulizer machine .  Activity as tolerated.  If Nebs are not affordable call me back  Follow up with Dr. Chase Caller in 3-4 months and  As needed

## 2021-07-25 NOTE — Progress Notes (Signed)
$'@Patient'W$  ID: Kimberly Waters, female    DOB: January 23, 1946, 75 y.o.   MRN: [id number]  Chief Complaint  Patient presents with   Follow-up    Referring provider: Josetta Huddle, MD  HPI: 75 year old female former smoker followed for moderate COPD Medical history significant for breast cancer status post double mastectomy and diastolic heart failure  TEST/EVENTS :  Test Results: Echo 04/2018>> EF 0000000, grade 1 diastolic dysfunction PAP 29 mm Hg   PFT's 4/3//2019>> FVC: 1.62, 2.75, 58% FEV1 1.09, 2.07, 52% F/F Ratio: 67,76, 88% SVC 1.83,2.75,66% TLC 88% DLCO: 46%   HRCT 03/26/2018 IMPRESSION: 1. No definitive evidence of interstitial lung disease. 2. Mild pulmonary edema and small right pleural effusion. 3. Aortic atherosclerosis (ICD10-170.0). Coronary artery calcification. 4.  Emphysema (ICD10-J43.9). 5. Hepatic steatosis. 6. Left adrenal adenoma.   Chest CT 04/2020 - neg acute process.   07/25/2021 Follow up : COPD  Patient returns for a 6-week follow-up patient has underlying COPD.  Says she gets winded with moderate activities.  She has a low activity tolerance.  Has a sedentary lifestyle.  She has previously been tried on Spiriva, Symbicort and BREZTRI -but was unable to afford them through her insurance.  Last visit patient was started on Stiolto.  She says it is working well for her.  However her insurance will not cover it and is too expensive for her to afford.  We discussed alternatives.  She would like to begin nebulizers if this is a more affordable option.  Patient denies any flare of cough or wheezing.  She has not been on prednisone recently.  Allergies  Allergen Reactions   Latex Other (See Comments)    Other reaction(s): Other (See Comments) Tears skin.   Adhesive [Tape] Other (See Comments)    (skin tears)  Tolerates PAPER TAPE   Codeine Nausea Only   Other Nausea Only    Anesthesia--nausea     Immunization History  Administered Date(s) Administered    Fluad Quad(high Dose 65+) 10/22/2019   Influenza Whole 12/03/2007, 12/16/2009   Influenza-Unspecified 09/25/2015   PFIZER(Purple Top)SARS-COV-2 Vaccination 03/23/2020, 04/13/2020, 10/25/2020   Pneumococcal Conjugate-13 09/17/2018   Pneumococcal Polysaccharide-23 01/11/2010   Td 02/28/2006    Past Medical History:  Diagnosis Date   Allergy    Breast cancer ([place])    Colon polyps    Depression    Dyspnea    SEASONAL   Family history of breast cancer    Family history of colon cancer    GERD (gastroesophageal reflux disease)    History of radiation therapy 06/05/17-07/20/17   right chest wall 50.4 Gy in 28 fractions, right axillary nodal region 45 Gy in 25 fractions   Hyperlipidemia    Hypertension    Hypothyroidism    Peripheral neuropathy 06/18/2019   PONV (postoperative nausea and vomiting)     Tobacco History: Social History   Tobacco Use  Smoking Status Former   Packs/day: 1.00   Years: 45.00   Pack years: 45.00   Types: Cigarettes   Quit date: 02/22/2017   Years since quitting: 4.4  Smokeless Tobacco Never   Counseling given: Not Answered   Outpatient Medications Prior to Visit  Medication Sig Dispense Refill   albuterol (PROVENTIL,VENTOLIN) 90 MCG/ACT inhaler Inhale 2 puffs into the lungs every 4 (four) hours as needed. 17 g 3   albuterol (VENTOLIN HFA) 108 (90 Base) MCG/ACT inhaler Inhale 2 puffs into the lungs every 4 (four) hours as needed.     anastrozole (ARIMIDEX)  1 MG tablet Take 1 tablet (1 mg total) by mouth daily. Stop May 2023 90 tablet 4   aspirin EC 81 MG EC tablet Take 1 tablet (81 mg total) by mouth daily.     betamethasone dipropionate [id number] % cream 1 application     budesonide-formoterol (SYMBICORT) 80-4.5 MCG/ACT inhaler      buPROPion (WELLBUTRIN SR) 150 MG 12 hr tablet Take 150 mg by mouth daily.      clobetasol ointment (TEMOVATE) 0.05 % as needed.     diphenhydrAMINE (BENADRYL) 25 MG tablet Take 25 mg by mouth as needed for sleep.      escitalopram (LEXAPRO) 10 MG tablet Take 1 tablet (10 mg total) by mouth daily. 30 tablet 0   ezetimibe (ZETIA) 10 MG tablet Take 1 tablet (10 mg total) by mouth daily. 90 tablet 3   fluconazole (DIFLUCAN) 100 MG tablet Take 1 tablet (100 mg total) by mouth daily. 5 tablet 0   furosemide (LASIX) 40 MG tablet Take 1 tablet (40 mg total) by mouth daily. 30 tablet 0   hydrOXYzine (ATARAX/VISTARIL) 25 MG tablet Take 25 mg by mouth at bedtime.     imipramine (TOFRANIL) 50 MG tablet Take 50 mg by mouth at bedtime.     ipratropium (ATROVENT) 0.06 % nasal spray Place 2 sprays into the nose 4 (four) times daily. 15 mL 3   ketoconazole (NIZORAL) 2 % cream APPLY TO AFFECTED AREA EVERY DAY 15 g 3   ketoconazole (NIZORAL) 2 % cream Apply 1 application topically daily. 30 g 6   levocetirizine (XYZAL) 5 MG tablet Take 5 mg by mouth as needed for allergies.     levothyroxine (SYNTHROID, LEVOTHROID) 112 MCG tablet TAKE ONE TABLET BY MOUTH ONCE DAILY 30 tablet 0   meloxicam (MOBIC) 7.5 MG tablet Take 7.5 mg by mouth at bedtime.     mometasone (ELOCON) 0.1 % ointment Apply topically as needed.     nitroGLYCERIN (NITROSTAT) 0.4 MG SL tablet as needed.     omeprazole (PRILOSEC) 20 MG capsule Take 20 mg by mouth daily.     Potassium Chloride ER 20 MEQ TBCR 1 tablet with food     potassium chloride SA (K-DUR,KLOR-CON) 20 MEQ tablet Take 1 tablet (20 mEq total) by mouth daily. 30 tablet 0   predniSONE (DELTASONE) 10 MG tablet Take by mouth.     rosuvastatin (CRESTOR) 20 MG tablet TAKE 1 TABLET BY MOUTH EVERY DAY 90 tablet 3   simvastatin (ZOCOR) 20 MG tablet Take 1 tablet by mouth every evening.     tacrolimus (PROTOPIC) 0.1 % ointment Apply topically as needed.     Tiotropium Bromide-Olodaterol (STIOLTO RESPIMAT) 2.5-2.5 MCG/ACT AERS Inhale 2 puffs into the lungs daily. 1 each 5   Tiotropium Bromide-Olodaterol (STIOLTO RESPIMAT) 2.5-2.5 MCG/ACT AERS Inhale 2 puffs into the lungs daily. 4 g 0   No  facility-administered medications prior to visit.     Review of Systems:   Constitutional:   No  weight loss, night sweats,  Fevers, chills,  +fatigue, or  lassitude.  HEENT:   No headaches,  Difficulty swallowing,  Tooth/dental problems, or  Sore throat,                No sneezing, itching, ear ache, nasal congestion, post nasal drip,   CV:  No chest pain,  Orthopnea, PND, swelling in lower extremities, anasarca, dizziness, palpitations, syncope.   GI  No heartburn, indigestion, abdominal pain, nausea, vomiting, diarrhea, change in bowel habits,  loss of appetite, bloody stools.   Resp:   No chest wall deformity  Skin: no rash or lesions.  GU: no dysuria, change in color of urine, no urgency or frequency.  No flank pain, no hematuria   MS:  No joint pain or swelling.  No decreased range of motion.  No back pain.    Physical Exam  BP 130/68 (BP Location: Left Arm, Patient Position: Sitting, Cuff Size: Large)   Pulse 85   Temp 97.7 F (36.5 C) (Oral)   Ht '5\' 1"'$  (1.549 m)   Wt 188 lb 6.4 oz (85.5 kg)   SpO2 99%   BMI 35.60 kg/m   GEN: A/Ox3; pleasant , NAD, elderly    HEENT:  [place]/AT,  NOSE-clear, THROAT-clear, no lesions, no postnasal drip or exudate noted.   NECK:  Supple w/ fair ROM; no JVD; normal carotid impulses w/o bruits; no thyromegaly or nodules palpated; no lymphadenopathy.    RESP  Clear  P & A; w/o, wheezes/ rales/ or rhonchi. no accessory muscle use, no dullness to percussion  CARD:  RRR, no m/r/g, no peripheral edema, pulses intact, no cyanosis or clubbing.  GI:   Soft & nt; nml bowel sounds; no organomegaly or masses detected.   Musco: Warm bil, no deformities or joint swelling noted.   Neuro: alert, no focal deficits noted.    Skin: Warm, no lesions or rashes    Lab Results:         PFT Results Latest Ref Rng & Units 03/28/2018  FVC-Pre L 1.62  FVC-Predicted Pre % 58  FVC-Post L 1.65  FVC-Predicted Post % 59  Pre FEV1/FVC % % 67  Post  FEV1/FCV % % 70  FEV1-Pre L 1.09  FEV1-Predicted Pre % 52  FEV1-Post L 1.15  DLCO uncorrected ml/min/mmHg 10.10  DLCO [facility]% % 46  DLVA Predicted % 68  TLC L 4.21  TLC % Predicted % 88  RV % Predicted % 113    No results found for: NITRICOXIDE      Assessment & Plan:   No problem-specific Assessment & Plan notes found for this encounter.     Rexene Edison, NP 07/25/2021

## 2021-07-25 NOTE — Assessment & Plan Note (Signed)
Compensated on present regimen.  However patient's insurance does not cover inhalers well.  She has been tried on multiple inhalers without good coverage from her insurance.  We will try to switch her to nebulized form.  To see if this is a more affordable route  Plan  Patient Instructions  Begin Ozella Almond daily  Begin [employer] Twice daily   New order for nebulizer machine .  Activity as tolerated.  If Nebs are not affordable call me back  Follow up with Dr. Chase Caller in 3-4 months and  As needed

## 2021-07-26 ENCOUNTER — Telehealth: Payer: Self-pay | Admitting: Adult Health

## 2021-07-26 MED ORDER — REVEFENACIN 175 MCG/3ML IN SOLN: 175.0000 ug | Freq: Every day | RESPIRATORY_TRACT | 5 refills | Status: DC

## 2021-07-26 MED ORDER — ARFORMOTEROL TARTRATE 15 MCG/2ML IN NEBU: 15.0000 ug | INHALATION_SOLUTION | Freq: Two times a day (BID) | RESPIRATORY_TRACT | 10 refills | Status: DC

## 2021-07-26 NOTE — Addendum Note (Signed)
Addended by: Dessie Coma on: 07/26/2021 01:56 PM   Modules accepted: Orders

## 2021-07-26 NOTE — Telephone Encounter (Signed)
I had tried to call Mrs. Petrenko and was unable to leave a message. I left a message for the daughter to have the patient give the office a call to let her know about sending the prescription to the mail pharmacy for the patient to get a nebulizer. Waiting on a call back.

## 2021-07-27 ENCOUNTER — Other Ambulatory Visit: Payer: Self-pay | Admitting: Adult Health

## 2021-07-27 MED ORDER — ARFORMOTEROL TARTRATE 15 MCG/2ML IN NEBU: 15.0000 ug | INHALATION_SOLUTION | Freq: Two times a day (BID) | RESPIRATORY_TRACT | 6 refills | Status: DC

## 2021-07-27 MED ORDER — YUPELRI 175 MCG/3ML IN SOLN: 175.0000 ug | Freq: Every day | RESPIRATORY_TRACT | 5 refills | Status: DC

## 2021-07-27 NOTE — Telephone Encounter (Signed)
Pt returning a phone call to state it will cost her an additional $200 outside of insurance for her to get the nebulizer machine. She has a nebulizer machine because her husband found hers and she just needs the medication. Pt can be reached at [phone]

## 2021-07-27 NOTE — Telephone Encounter (Signed)
I called and spoke with patient regarding message. The neb machine was going to cost over 500 after insurance but found husband machine so will use that. She requested neb meds be sent to [facility] on [address] instead of [facility]. She stated it was easier for her. I sent in [place] and [place] [facility]bs to [facility]. Informed patient to call with any other needs. Patient verbalized understanding, nothing further needed.

## 2021-08-08 NOTE — Progress Notes (Signed)
[facility] Note  08/09/2021     CHIEF COMPLAINT Patient presents for Retina Follow Up   HISTORY OF PRESENT ILLNESS: Kimberly Waters is a 75 y.o. female who presents to the clinic today for:  HPI     Retina Follow Up   Patient presents with  Wet AMD.  In both eyes.  This started 5 weeks ago.  I, the attending physician,  performed the HPI with the patient and updated documentation appropriately.        Comments   Patient here for 5 weeks retina follow up for exu ARMD OU. Patient states vision doing fine. No eye pain.       Last edited by Bernarda Caffey, MD on 08/09/2021 11:06 AM.      Referring physician: Josetta Huddle, MD [address] [place],  [place] [address]  HISTORICAL INFORMATION:   Selected notes from the MEDICAL RECORD NUMBER Referred by Dr. Marin Comment for eval of decreased VA OD.   CURRENT MEDICATIONS: No current outpatient medications on file. (Ophthalmic Drugs)   No current facility-administered medications for this visit. (Ophthalmic Drugs)   Current Outpatient Medications (Other)  Medication Sig   albuterol (PROVENTIL,VENTOLIN) 90 MCG/ACT inhaler Inhale 2 puffs into the lungs every 4 (four) hours as needed.   albuterol (VENTOLIN HFA) 108 (90 Base) MCG/ACT inhaler Inhale 2 puffs into the lungs every 4 (four) hours as needed.   anastrozole (ARIMIDEX) 1 MG tablet Take 1 tablet (1 mg total) by mouth daily. Stop May 2023   arformoterol (BROVANA) 15 MCG/2ML NEBU Take 2 mLs (15 mcg total) by nebulization 2 (two) times daily.   arformoterol (BROVANA) 15 MCG/2ML NEBU Take 2 mLs (15 mcg total) by nebulization 2 (two) times daily.   aspirin EC 81 MG EC tablet Take 1 tablet (81 mg total) by mouth daily.   betamethasone dipropionate [id number] % cream 1 application   budesonide-formoterol (SYMBICORT) 80-4.5 MCG/ACT inhaler    buPROPion (WELLBUTRIN SR) 150 MG 12 hr tablet Take 150 mg by mouth daily.    clobetasol ointment (TEMOVATE) 0.05 %  as needed.   diphenhydrAMINE (BENADRYL) 25 MG tablet Take 25 mg by mouth as needed for sleep.   escitalopram (LEXAPRO) 10 MG tablet Take 1 tablet (10 mg total) by mouth daily.   ezetimibe (ZETIA) 10 MG tablet Take 1 tablet (10 mg total) by mouth daily.   fluconazole (DIFLUCAN) 100 MG tablet Take 1 tablet (100 mg total) by mouth daily.   furosemide (LASIX) 40 MG tablet Take 1 tablet (40 mg total) by mouth daily.   hydrOXYzine (ATARAX/VISTARIL) 25 MG tablet Take 25 mg by mouth at bedtime.   imipramine (TOFRANIL) 50 MG tablet Take 50 mg by mouth at bedtime.   ipratropium (ATROVENT) 0.06 % nasal spray Place 2 sprays into the nose 4 (four) times daily.   ketoconazole (NIZORAL) 2 % cream APPLY TO AFFECTED AREA EVERY DAY   ketoconazole (NIZORAL) 2 % cream Apply 1 application topically daily.   levocetirizine (XYZAL) 5 MG tablet Take 5 mg by mouth as needed for allergies.   levothyroxine (SYNTHROID, LEVOTHROID) 112 MCG tablet TAKE ONE TABLET BY MOUTH ONCE DAILY   meloxicam (MOBIC) 7.5 MG tablet Take 7.5 mg by mouth at bedtime.   mometasone (ELOCON) 0.1 % ointment Apply topically as needed.   nitroGLYCERIN (NITROSTAT) 0.4 MG SL tablet as needed.   omeprazole (PRILOSEC) 20 MG capsule Take 20 mg by mouth daily.   Potassium Chloride  ER 20 MEQ TBCR 1 tablet with food   potassium chloride SA (K-DUR,KLOR-CON) 20 MEQ tablet Take 1 tablet (20 mEq total) by mouth daily.   predniSONE (DELTASONE) 10 MG tablet Take by mouth.   revefenacin (YUPELRI) 175 MCG/3ML nebulizer solution Take 3 mLs (175 mcg total) by nebulization daily.   revefenacin (YUPELRI) 175 MCG/3ML nebulizer solution Take 3 mLs (175 mcg total) by nebulization daily.   rosuvastatin (CRESTOR) 20 MG tablet TAKE 1 TABLET BY MOUTH EVERY DAY   simvastatin (ZOCOR) 20 MG tablet Take 1 tablet by mouth every evening.   tacrolimus (PROTOPIC) 0.1 % ointment Apply topically as needed.   Tiotropium Bromide-Olodaterol (STIOLTO RESPIMAT) 2.5-2.5 MCG/ACT AERS  Inhale 2 puffs into the lungs daily.   Tiotropium Bromide-Olodaterol (STIOLTO RESPIMAT) 2.5-2.5 MCG/ACT AERS Inhale 2 puffs into the lungs daily.   No current facility-administered medications for this visit. (Other)   REVIEW OF SYSTEMS: ROS   Positive for: Gastrointestinal, Endocrine, Cardiovascular, Eyes, Respiratory, Psychiatric Negative for: Constitutional, Neurological, Skin, Genitourinary, Musculoskeletal, HENT, Allergic/Imm, Heme/Lymph Last edited by Theodore Demark, COA on 08/09/2021 10:09 AM.     ALLERGIES Allergies  Allergen Reactions   Latex Other (See Comments)    Other reaction(s): Other (See Comments) Tears skin.   Adhesive [Tape] Other (See Comments)    (skin tears)  Tolerates PAPER TAPE   Codeine Nausea Only   Other Nausea Only    Anesthesia--nausea    PAST MEDICAL HISTORY Past Medical History:  Diagnosis Date   Allergy    Breast cancer ([place])    Colon polyps    Depression    Dyspnea    SEASONAL   Family history of breast cancer    Family history of colon cancer    GERD (gastroesophageal reflux disease)    History of radiation therapy 06/05/17-07/20/17   right chest wall 50.4 Gy in 28 fractions, right axillary nodal region 45 Gy in 25 fractions   Hyperlipidemia    Hypertension    Hypothyroidism    Peripheral neuropathy 06/18/2019   PONV (postoperative nausea and vomiting)    Past Surgical History:  Procedure Laterality Date   ABDOMINAL HYSTERECTOMY     KNEE SURGERY Right    MASTECTOMY W/ SENTINEL NODE BIOPSY Right 03/05/2017   Procedure: BILATERAL TOTAL MASTECTOMIES WITH RIGHT SENTINEL LYMPH NODE BIOPSIES;  Surgeon: Excell Seltzer, MD;  Location: [facility] OR;  Service: General;  Laterality: Right;   SHOULDER SURGERY     BILAT   FAMILY HISTORY Family History  Problem Relation Age of Onset   Heart disease Mother 74       stents   Colon cancer Maternal Grandfather 59   Colon cancer Maternal Aunt 65   Colon cancer Maternal Uncle        dx in his  67s   Breast cancer Paternal Aunt 64   Breast cancer Cousin 107       paternal first cousin   Lung cancer Paternal Grandfather    Colon cancer Maternal Uncle    Head & neck cancer Maternal Uncle        oral cancer   SOCIAL HISTORY Social History   Tobacco Use   Smoking status: Former    Packs/day: 1.00    Years: 45.00    Pack years: 45.00    Types: Cigarettes    Quit date: 02/22/2017    Years since quitting: 4.4   Smokeless tobacco: Never  Vaping Use   Vaping Use: Never used  Substance Use Topics  Alcohol use: No    Alcohol/week: 0.0 standard drinks   Drug use: No         OPHTHALMIC EXAM:  Base Eye Exam     Visual Acuity (Snellen - Linear)       Right Left   Dist cc 20/40 +1 CF at 2'   Dist ph cc NI NI    Correction: Glasses         Tonometry (Tonopen, 10:06 AM)       Right Left   Pressure 16 17         Pupils       Dark Light Shape React APD   Right 3 2 Round Brisk None   Left 3 2 Round Brisk None         Visual Fields (Counting fingers)       Left Right     Full   Restrictions Partial inner superior temporal, inferior temporal, superior nasal, inferior nasal deficiencies          Extraocular Movement       Right Left    Full Full         Neuro/Psych     Oriented x3: Yes   Mood/Affect: Normal         Dilation     Both eyes: 1.0% Mydriacyl, 2.5% Phenylephrine @ 10:06 AM           Slit Lamp and Fundus Exam     Slit Lamp Exam       Right Left   Lids/Lashes Dermatochalasis - upper lid Dermatochalasis - upper lid   Conjunctiva/Sclera nasal and temporal pinguecula nasal and temporal pinguecula   Cornea 2+ Punctate epithelial erosions, irregular epi, arcus, decreased TBUT 2+ Punctate epithelial erosions, irregular epi, arcus, decreased TBUT   Anterior Chamber deep, narrow temporal angle deep, narrow temporal angle   Iris Round and moderately dilated, mild anterior bowing Round and moderately dilated, mild anterior  bowing   Lens 2-3+ Nuclear sclerosis with early brunescence, 2-3+ Cortical cataract 2-3+ Nuclear sclerosis with early brunescence, 2-3+ Cortical cataract   Vitreous Vitreous syneresis, Posterior vitreous detachment Vitreous syneresis, Posterior vitreous detachment         Fundus Exam       Right Left   Disc Pink and Sharp, Compact, mild PPA Mild Pallor, Sharp rim, +elevation   C/D Ratio 0.0 0.1   Macula Blunted foveal reflex, +CNV with pigment clumping/ring, heme stably improved, interval improvement in SRF, RPE mottling and clumping, [place] Blunted foveal reflex, large area of GA with disciform scar and subretinal fibrosis, central pigment clumping, punctate heme IT to fovea -- persistent   Vessels attenuated, mild tortuousity attenuated, Tortuous   Periphery Attached, scattered reticular degeneration, paving stone degeneration Attached, scattered reticular degeneration, scattered paving stone degeneration           Refraction     Wearing Rx       Sphere Cylinder Axis   Right -6.50 +2.00 152   Left -5.25 +1.00 018    Type: SVL           IMAGING AND PROCEDURES  Imaging and Procedures for 08/09/2021  OCT, Retina - OU - Both Eyes       Right Eye Quality was good. Central Foveal Thickness: 351. Progression has improved. Findings include abnormal foveal contour, subretinal hyper-reflective material, pigment epithelial detachment, choroidal neovascular membrane, no IRF, no SRF (interval improvement in SRF overlying PED/SRHM).   Left Eye Quality was good. Central Foveal  Thickness: 269. Progression has been stable. Findings include abnormal foveal contour, no IRF, no SRF, subretinal hyper-reflective material, outer retinal atrophy, lamellar hole (large central disciform scar with ORA, Foveal notch, stable improvement in trace cystic changes and temporal edema).   Notes *Images captured and stored on drive  Diagnosis / Impression:  exu ARMD OU OD with active CNV --  interval improvement in SRF overlying PED/SRHM OS with inactive disciform scar with stable improvement in trace cystic changes and temporal edema  Clinical management:  See below  Abbreviations: NFP - Normal foveal profile. CME - cystoid macular edema. PED - pigment epithelial detachment. IRF - intraretinal fluid. SRF - subretinal fluid. EZ - ellipsoid zone. ERM - epiretinal membrane. ORA - outer retinal atrophy. ORT - outer retinal tubulation. SRHM - subretinal hyper-reflective material. IRHM - intraretinal hyper-reflective material      Intravitreal Injection, Pharmacologic Agent - OS - Left Eye       Time Out 08/09/2021. 10:12 AM. Confirmed correct patient, procedure, site, and patient consented.   Anesthesia Topical anesthesia was used. Anesthetic medications included Lidocaine 2%, Proparacaine 0.5%.   Procedure Preparation included eyelid speculum, 5% betadine to ocular surface. A (32g) needle was used.   Injection: 1.25 mg Bevacizumab 1.'25mg'$ /0.32m   Route: Intravitreal, Site: Left Eye   NDC: [id number] Expiration date: 09/13/2021, Waste: 0.05 mL   Post-op Post injection exam found visual acuity of at least counting fingers. The patient tolerated the procedure well. There were no complications. The patient received written and verbal post procedure care education. Post injection medications were not given.      Intravitreal Injection, Pharmacologic Agent - OD - Right Eye       Time Out 08/09/2021. 10:23 AM. Confirmed correct patient, procedure, site, and patient consented.   Anesthesia Topical anesthesia was used. Anesthetic medications included Lidocaine 2%, Proparacaine 0.5%.   Procedure Preparation included 5% betadine to ocular surface, eyelid speculum. A supplied needle was used.   Injection: 1.25 mg Bevacizumab 1.'25mg'$ /0.024m  Route: Intravitreal, Site: Right Eye   NDC: [id number]: [id number] date: 09/19/2021, Waste: 0 mL    Post-op Post injection exam found visual acuity of at least counting fingers. The patient tolerated the procedure well. There were no complications. The patient received written and verbal post procedure care education. Post injection medications were not given.            ASSESSMENT/PLAN:    ICD-10-CM   1. Exudative age-related macular degeneration of right eye with active choroidal neovascularization (HCC)  H35.3211 Intravitreal Injection, Pharmacologic Agent - OD - Right Eye    Bevacizumab (AVASTIN) SOLN 1.25 mg    2. Exudative age-related macular degeneration of left eye with active choroidal neovascularization (HCC)  H35.3221 Intravitreal Injection, Pharmacologic Agent - OS - Left Eye    Bevacizumab (AVASTIN) SOLN 1.25 mg    3. Retinal edema  H35.81 OCT, Retina - OU - Both Eyes    4. Essential hypertension  I10     5. Hypertensive retinopathy of both eyes  H35.033     6. Combined forms of age-related cataract of both eyes  H25.813      1,2. Exudative age related macular degeneration OD  - OD with active CNV  - pt initially presented w/ 2 wk history of decreased vision OD  - history of intravitreal injections OS with Dr. RaZadie Rhinen 2017 and earlier -- pt reports stopping visits after the development of a corneal abrasion from a lid speculum.  -  s/p IVA OD #1 (12.21.21), #2 (01.24.22), #3 (2.21.22), #4 (03.22.22), #5 (4.26.22), #6 (5.31.22), #7 (7.12.22)  - OCT shows interval improvement in SRF overlying shallow PED/CNVM at 5 weeks  - BCVA OD - stable at 20/40              - Recommend IVA OD #8 today, 08.15.22 w/ f/u in 5 wks  - pt wishes to be treated with IVA  - RBA of procedure discussed, questions answered  - informed consent obtained and signed  - see procedure note  - f/u in 5 wks -- DFE/OCT, possible injection   3. Exudative age related macular degeneration, OS  - OS with inactive disciform scar, but new focal IRH noted 5.31.22 -- persistent on exam today  -  history of intravitreal injections OS with Dr. Zadie Rhine in 2017 and earlier -- pt reports stopping visits after the development of a corneal abrasion from a lid speculum.  - has not seen a retina specialist since 2017  - OS now with large area of central GA with overlying disciform scar  - exam shows persistent IRH overlying scar  - OCT shows stable disciform scar -- no IRF/SRF  - BCVA CF OS             - s/p IVA OS #1 (05.31.22), #2 (07.12.22) - recommend IVA OS #3 today, 08.15.22 for persistent IRH - pt wishes to proceed  - RBA of procedure discussed, questions answered  - informed consent obtained and signed - see procedure note - f/u 5 weeks, DFE, OCT  4,5. Hypertensive retinopathy OU - discussed importance of tight BP control - monitor  6. Mixed Cataract OU - The symptoms of cataract, surgical options, and treatments and risks were discussed with patient. - discussed diagnosis and progression - approaching visual significance - monitor  Ophthalmic Meds Ordered this visit:  Meds ordered this encounter  Medications   Bevacizumab (AVASTIN) SOLN 1.25 mg   Bevacizumab (AVASTIN) SOLN 1.25 mg       Return in about 5 weeks (around 09/13/2021) for f/u exu ARMD OU, DFE, OCT.  There are no Patient Instructions on file for this visit.  This document serves as a record of services personally performed by Gardiner Sleeper, MD, PhD. It was created on their behalf by Estill Bakes, COT an ophthalmic technician. The creation of this record is the provider's dictation and/or activities during the visit.    Electronically signed by: Estill Bakes, COT 8.15.22 @ 11:11 AM   This document serves as a record of services personally performed by Gardiner Sleeper, MD, PhD. It was created on their behalf by San Jetty. Owens Shark, OA an ophthalmic technician. The creation of this record is the provider's dictation and/or activities during the visit.    Electronically signed by: San Jetty. Owens Shark, [place]  08.16.2022 11:11 AM   Gardiner Sleeper, M.D., Ph.D. Diseases & Surgery of the Retina and Vitreous [facility]  I have reviewed the above documentation for accuracy and completeness, and I agree with the above. Gardiner Sleeper, M.D., Ph.D. 08/09/21 11:11 AM  Abbreviations: M myopia (nearsighted); A astigmatism; H hyperopia (farsighted); P presbyopia; Mrx spectacle prescription;  CTL contact lenses; OD right eye; OS left eye; OU both eyes  XT exotropia; ET esotropia; PEK punctate epithelial keratitis; PEE punctate epithelial erosions; DES dry eye syndrome; MGD meibomian gland dysfunction; ATs artificial tears; PFAT's preservative free artificial tears; [place] nuclear sclerotic cataract; PSC posterior subcapsular cataract; ERM  epi-retinal membrane; PVD posterior vitreous detachment; RD retinal detachment; DM diabetes mellitus; DR diabetic retinopathy; NPDR non-proliferative diabetic retinopathy; PDR proliferative diabetic retinopathy; CSME clinically significant macular edema; DME diabetic macular edema; dbh dot blot hemorrhages; CWS cotton wool spot; POAG primary open angle glaucoma; C/D cup-to-disc ratio; HVF humphrey visual field; GVF goldmann visual field; OCT optical coherence tomography; IOP intraocular pressure; BRVO Branch retinal vein occlusion; CRVO central retinal vein occlusion; CRAO central retinal artery occlusion; BRAO branch retinal artery occlusion; RT retinal tear; SB scleral buckle; PPV pars plana vitrectomy; VH Vitreous hemorrhage; PRP panretinal laser photocoagulation; IVK intravitreal kenalog; VMT vitreomacular traction; MH Macular hole;  NVD neovascularization of the disc; NVE neovascularization elsewhere; AREDS age related eye disease study; ARMD age related macular degeneration; POAG primary open angle glaucoma; EBMD epithelial/anterior basement membrane dystrophy; ACIOL anterior chamber intraocular lens; IOL intraocular lens; PCIOL posterior chamber intraocular  lens; Phaco/IOL phacoemulsification with intraocular lens placement; [place] photorefractive keratectomy; LASIK laser assisted in situ keratomileusis; HTN hypertension; DM diabetes mellitus; COPD chronic obstructive pulmonary disease

## 2021-08-09 ENCOUNTER — Encounter (INDEPENDENT_AMBULATORY_CARE_PROVIDER_SITE_OTHER): Payer: Self-pay | Admitting: Ophthalmology

## 2021-08-09 ENCOUNTER — Other Ambulatory Visit: Payer: Self-pay

## 2021-08-09 ENCOUNTER — Ambulatory Visit (INDEPENDENT_AMBULATORY_CARE_PROVIDER_SITE_OTHER): Payer: Medicare Other | Admitting: Ophthalmology

## 2021-08-09 DIAGNOSIS — H353231 Exudative age-related macular degeneration, bilateral, with active choroidal neovascularization: Secondary | ICD-10-CM

## 2021-08-09 DIAGNOSIS — H3581 Retinal edema: Secondary | ICD-10-CM | POA: Diagnosis not present

## 2021-08-09 DIAGNOSIS — H35033 Hypertensive retinopathy, bilateral: Secondary | ICD-10-CM | POA: Diagnosis not present

## 2021-08-09 DIAGNOSIS — H25813 Combined forms of age-related cataract, bilateral: Secondary | ICD-10-CM

## 2021-08-09 DIAGNOSIS — I10 Essential (primary) hypertension: Secondary | ICD-10-CM

## 2021-08-09 DIAGNOSIS — H353211 Exudative age-related macular degeneration, right eye, with active choroidal neovascularization: Secondary | ICD-10-CM

## 2021-08-09 DIAGNOSIS — H353221 Exudative age-related macular degeneration, left eye, with active choroidal neovascularization: Secondary | ICD-10-CM

## 2021-08-09 MED ORDER — BEVACIZUMAB CHEMO INJECTION 1.25MG/0.05ML SYRINGE FOR KALEIDOSCOPE
1.2500 mg | INTRAVITREAL | Status: AC | PRN
  Administered 2021-08-09: 1.25 mg via INTRAVITREAL

## 2021-08-19 ENCOUNTER — Other Ambulatory Visit: Payer: Self-pay | Admitting: Cardiology

## 2021-08-31 DIAGNOSIS — F322 Major depressive disorder, single episode, severe without psychotic features: Secondary | ICD-10-CM | POA: Diagnosis not present

## 2021-08-31 DIAGNOSIS — J45998 Other asthma: Secondary | ICD-10-CM | POA: Diagnosis not present

## 2021-08-31 DIAGNOSIS — I10 Essential (primary) hypertension: Secondary | ICD-10-CM | POA: Diagnosis not present

## 2021-08-31 DIAGNOSIS — G4700 Insomnia, unspecified: Secondary | ICD-10-CM | POA: Diagnosis not present

## 2021-08-31 DIAGNOSIS — E039 Hypothyroidism, unspecified: Secondary | ICD-10-CM | POA: Diagnosis not present

## 2021-08-31 DIAGNOSIS — K219 Gastro-esophageal reflux disease without esophagitis: Secondary | ICD-10-CM | POA: Diagnosis not present

## 2021-08-31 DIAGNOSIS — J439 Emphysema, unspecified: Secondary | ICD-10-CM | POA: Diagnosis not present

## 2021-08-31 DIAGNOSIS — M170 Bilateral primary osteoarthritis of knee: Secondary | ICD-10-CM | POA: Diagnosis not present

## 2021-08-31 DIAGNOSIS — J441 Chronic obstructive pulmonary disease with (acute) exacerbation: Secondary | ICD-10-CM | POA: Diagnosis not present

## 2021-08-31 DIAGNOSIS — I509 Heart failure, unspecified: Secondary | ICD-10-CM | POA: Diagnosis not present

## 2021-08-31 DIAGNOSIS — E785 Hyperlipidemia, unspecified: Secondary | ICD-10-CM | POA: Diagnosis not present

## 2021-09-13 ENCOUNTER — Ambulatory Visit (INDEPENDENT_AMBULATORY_CARE_PROVIDER_SITE_OTHER): Payer: Medicare Other | Admitting: Ophthalmology

## 2021-09-13 ENCOUNTER — Encounter (INDEPENDENT_AMBULATORY_CARE_PROVIDER_SITE_OTHER): Payer: Self-pay | Admitting: Ophthalmology

## 2021-09-13 ENCOUNTER — Other Ambulatory Visit: Payer: Self-pay

## 2021-09-13 DIAGNOSIS — H35033 Hypertensive retinopathy, bilateral: Secondary | ICD-10-CM

## 2021-09-13 DIAGNOSIS — H353231 Exudative age-related macular degeneration, bilateral, with active choroidal neovascularization: Secondary | ICD-10-CM

## 2021-09-13 DIAGNOSIS — H353221 Exudative age-related macular degeneration, left eye, with active choroidal neovascularization: Secondary | ICD-10-CM

## 2021-09-13 DIAGNOSIS — I10 Essential (primary) hypertension: Secondary | ICD-10-CM | POA: Diagnosis not present

## 2021-09-13 DIAGNOSIS — H353211 Exudative age-related macular degeneration, right eye, with active choroidal neovascularization: Secondary | ICD-10-CM

## 2021-09-13 DIAGNOSIS — H25813 Combined forms of age-related cataract, bilateral: Secondary | ICD-10-CM | POA: Diagnosis not present

## 2021-09-13 DIAGNOSIS — H3581 Retinal edema: Secondary | ICD-10-CM

## 2021-09-13 NOTE — Progress Notes (Signed)
[facility] Note  09/13/2021     CHIEF COMPLAINT Patient presents for Retina Follow Up   HISTORY OF PRESENT ILLNESS: Kimberly Waters is a 75 y.o. female who presents to the clinic today for:  HPI     Retina Follow Up   Patient presents with  Wet AMD.  In both eyes.  This started months ago.  Severity is moderate.  Duration of 5 weeks.  Since onset it is stable.  I, the attending physician,  performed the HPI with the patient and updated documentation appropriately.        Comments   75 y/o female pt here for 5 wk f/u for exu ARMD OU.  No change in [place] OU.  Denies pain, FOL, floaters.  No gtts.      Last edited by Bernarda Caffey, MD on 09/14/2021 12:48 AM.      Referring physician: Josetta Huddle, MD [address] [place],  [place] [address]  HISTORICAL INFORMATION:   Selected notes from the MEDICAL RECORD NUMBER Referred by Dr. Marin Comment for eval of decreased VA OD.   CURRENT MEDICATIONS: No current outpatient medications on file. (Ophthalmic Drugs)   No current facility-administered medications for this visit. (Ophthalmic Drugs)   Current Outpatient Medications (Other)  Medication Sig   albuterol (PROVENTIL,VENTOLIN) 90 MCG/ACT inhaler Inhale 2 puffs into the lungs every 4 (four) hours as needed.   albuterol (VENTOLIN HFA) 108 (90 Base) MCG/ACT inhaler Inhale 2 puffs into the lungs every 4 (four) hours as needed.   anastrozole (ARIMIDEX) 1 MG tablet Take 1 tablet (1 mg total) by mouth daily. Stop May 2023   arformoterol (BROVANA) 15 MCG/2ML NEBU Take 2 mLs (15 mcg total) by nebulization 2 (two) times daily.   arformoterol (BROVANA) 15 MCG/2ML NEBU Take 2 mLs (15 mcg total) by nebulization 2 (two) times daily.   aspirin EC 81 MG EC tablet Take 1 tablet (81 mg total) by mouth daily.   betamethasone dipropionate 5.80 % cream 1 application   budesonide-formoterol (SYMBICORT) 80-4.5 MCG/ACT inhaler    buPROPion (WELLBUTRIN SR) 150 MG 12 hr  tablet Take 150 mg by mouth daily.    clobetasol ointment (TEMOVATE) 0.05 % as needed.   diphenhydrAMINE (BENADRYL) 25 MG tablet Take 25 mg by mouth as needed for sleep.   escitalopram (LEXAPRO) 10 MG tablet Take 1 tablet (10 mg total) by mouth daily.   ezetimibe (ZETIA) 10 MG tablet TAKE 1 TABLET BY MOUTH EVERY DAY   fluconazole (DIFLUCAN) 100 MG tablet Take 1 tablet (100 mg total) by mouth daily.   furosemide (LASIX) 40 MG tablet Take 1 tablet (40 mg total) by mouth daily.   hydrOXYzine (ATARAX/VISTARIL) 25 MG tablet Take 25 mg by mouth at bedtime.   imipramine (TOFRANIL) 50 MG tablet Take 50 mg by mouth at bedtime.   ipratropium (ATROVENT) 0.06 % nasal spray Place 2 sprays into the nose 4 (four) times daily.   ketoconazole (NIZORAL) 2 % cream APPLY TO AFFECTED AREA EVERY DAY   ketoconazole (NIZORAL) 2 % cream Apply 1 application topically daily.   levocetirizine (XYZAL) 5 MG tablet Take 5 mg by mouth as needed for allergies.   levothyroxine (SYNTHROID, LEVOTHROID) 112 MCG tablet TAKE ONE TABLET BY MOUTH ONCE DAILY   meloxicam (MOBIC) 7.5 MG tablet Take 7.5 mg by mouth at bedtime.   mometasone (ELOCON) 0.1 % ointment Apply topically as needed.   nitroGLYCERIN (NITROSTAT) 0.4 MG SL tablet as  needed.   omeprazole (PRILOSEC) 20 MG capsule Take 20 mg by mouth daily.   Potassium Chloride ER 20 MEQ TBCR 1 tablet with food   potassium chloride SA (K-DUR,KLOR-CON) 20 MEQ tablet Take 1 tablet (20 mEq total) by mouth daily.   predniSONE (DELTASONE) 10 MG tablet Take by mouth.   revefenacin (YUPELRI) 175 MCG/3ML nebulizer solution Take 3 mLs (175 mcg total) by nebulization daily.   revefenacin (YUPELRI) 175 MCG/3ML nebulizer solution Take 3 mLs (175 mcg total) by nebulization daily.   rosuvastatin (CRESTOR) 20 MG tablet TAKE 1 TABLET BY MOUTH EVERY DAY   simvastatin (ZOCOR) 20 MG tablet Take 1 tablet by mouth every evening.   tacrolimus (PROTOPIC) 0.1 % ointment Apply topically as needed.    Tiotropium Bromide-Olodaterol (STIOLTO RESPIMAT) 2.5-2.5 MCG/ACT AERS Inhale 2 puffs into the lungs daily.   Tiotropium Bromide-Olodaterol (STIOLTO RESPIMAT) 2.5-2.5 MCG/ACT AERS Inhale 2 puffs into the lungs daily.   No current facility-administered medications for this visit. (Other)   REVIEW OF SYSTEMS: ROS   Positive for: Neurological, Eyes, Respiratory Negative for: Constitutional, Gastrointestinal, Skin, Genitourinary, Musculoskeletal, HENT, Endocrine, Cardiovascular, Psychiatric, Allergic/Imm, Heme/Lymph Last edited by Matthew Folks, COA on 09/13/2021  2:23 PM.      ALLERGIES Allergies  Allergen Reactions   Latex Other (See Comments)    Other reaction(s): Other (See Comments) Tears skin.   Adhesive [Tape] Other (See Comments)    (skin tears)  Tolerates PAPER TAPE   Codeine Nausea Only   Other Nausea Only    Anesthesia--nausea    PAST MEDICAL HISTORY Past Medical History:  Diagnosis Date   Allergy    Breast cancer ([place])    Cataract    Colon polyps    Depression    Dyspnea    SEASONAL   Family history of breast cancer    Family history of colon cancer    GERD (gastroesophageal reflux disease)    History of radiation therapy 06/05/17-07/20/17   right chest wall 50.4 Gy in 28 fractions, right axillary nodal region 45 Gy in 25 fractions   Hyperlipidemia    Hypertension    Hypertensive retinopathy    Hypothyroidism    Macular degeneration    Peripheral neuropathy 06/18/2019   PONV (postoperative nausea and vomiting)    Past Surgical History:  Procedure Laterality Date   ABDOMINAL HYSTERECTOMY     KNEE SURGERY Right    MASTECTOMY W/ SENTINEL NODE BIOPSY Right 03/05/2017   Procedure: BILATERAL TOTAL MASTECTOMIES WITH RIGHT SENTINEL LYMPH NODE BIOPSIES;  Surgeon: Excell Seltzer, MD;  Location: [facility] OR;  Service: General;  Laterality: Right;   SHOULDER SURGERY     BILAT   FAMILY HISTORY Family History  Problem Relation Age of Onset   Heart disease Mother 67        stents   Colon cancer Maternal Grandfather 59   Colon cancer Maternal Aunt 65   Colon cancer Maternal Uncle        dx in his 18s   Breast cancer Paternal Aunt 21   Breast cancer Cousin 11       paternal first cousin   Lung cancer Paternal Grandfather    Colon cancer Maternal Uncle    Head & neck cancer Maternal Uncle        oral cancer   SOCIAL HISTORY Social History   Tobacco Use   Smoking status: Former    Packs/day: 1.00    Years: 45.00    Pack years: 45.00  Types: Cigarettes    Quit date: 02/22/2017    Years since quitting: 4.5   Smokeless tobacco: Never  Vaping Use   Vaping Use: Never used  Substance Use Topics   Alcohol use: No    Alcohol/week: 0.0 standard drinks   Drug use: No         OPHTHALMIC EXAM:  Base Eye Exam     Visual Acuity (Snellen - Linear)       Right Left   Dist cc 20/30 CF @ 3'   Dist ph cc NI NI    Correction: Glasses         Tonometry (Tonopen, 2:26 PM)       Right Left   Pressure 15 15         Pupils       Dark Light Shape React APD   Right 3 2 Round Brisk None   Left 3 2 Round Brisk None         Visual Fields (Counting fingers)       Left Right     Full   Restrictions Partial inner superior temporal, inferior temporal, superior nasal, inferior nasal deficiencies          Extraocular Movement       Right Left    Full, Ortho Full, Ortho         Neuro/Psych     Oriented x3: Yes   Mood/Affect: Normal         Dilation     Both eyes: 1.0% Mydriacyl, 2.5% Phenylephrine @ 2:26 PM           Slit Lamp and Fundus Exam     Slit Lamp Exam       Right Left   Lids/Lashes Dermatochalasis - upper lid Dermatochalasis - upper lid   Conjunctiva/Sclera nasal and temporal pinguecula nasal and temporal pinguecula   Cornea 2+ Punctate epithelial erosions, irregular epi, arcus, decreased TBUT 2+ Punctate epithelial erosions, irregular epi, arcus, decreased TBUT   Anterior Chamber deep, narrow  temporal angle deep, narrow temporal angle   Iris Round and moderately dilated, mild anterior bowing Round and moderately dilated, mild anterior bowing   Lens 2-3+ Nuclear sclerosis with early brunescence, 2-3+ Cortical cataract 2-3+ Nuclear sclerosis with early brunescence, 2-3+ Cortical cataract   Vitreous Vitreous syneresis, Posterior vitreous detachment Vitreous syneresis, Posterior vitreous detachment         Fundus Exam       Right Left   Disc Pink and Sharp, Compact, mild PPA Mild Pallor, Sharp rim, +elevation   C/D Ratio 0.0 0.1   Macula Blunted foveal reflex, +CNV with pigment clumping/ring, heme stably improved, trace, persistent SRF, RPE mottling and clumping, [place] Blunted foveal reflex, large area of GA with disciform scar and subretinal fibrosis, central pigment clumping, punctate heme IT to fovea -- persistent   Vessels attenuated, mild tortuousity attenuated, Tortuous   Periphery Attached, scattered reticular degeneration, paving stone degeneration Attached, scattered reticular degeneration, scattered paving stone degeneration           IMAGING AND PROCEDURES  Imaging and Procedures for 09/13/2021  OCT, Retina - OU - Both Eyes       Right Eye Quality was good. Central Foveal Thickness: 354. Progression has improved. Findings include abnormal foveal contour, subretinal hyper-reflective material, pigment epithelial detachment, choroidal neovascular membrane, no IRF, no SRF (Trace persistent SRF overlying PED/SRHM).   Left Eye Quality was good. Central Foveal Thickness: 264. Progression has been stable. Findings include abnormal  foveal contour, no IRF, no SRF, subretinal hyper-reflective material, outer retinal atrophy, lamellar hole (large central disciform scar with ORA, Foveal notch, stable improvement in trace cystic changes and temporal edema).   Notes *Images captured and stored on drive  Diagnosis / Impression:  exu ARMD OU OD with active CNV -- Trace  persistent SRF overlying PED/SRHM OS with inactive disciform scar with stable improvement in trace cystic changes and temporal edema  Clinical management:  See below  Abbreviations: NFP - Normal foveal profile. CME - cystoid macular edema. PED - pigment epithelial detachment. IRF - intraretinal fluid. SRF - subretinal fluid. EZ - ellipsoid zone. ERM - epiretinal membrane. ORA - outer retinal atrophy. ORT - outer retinal tubulation. SRHM - subretinal hyper-reflective material. IRHM - intraretinal hyper-reflective material      Intravitreal Injection, Pharmacologic Agent - OD - Right Eye       Time Out 09/13/2021. 3:45 PM. Confirmed correct patient, procedure, site, and patient consented.   Anesthesia Topical anesthesia was used. Anesthetic medications included Lidocaine 2%, Proparacaine 0.5%.   Procedure Preparation included 5% betadine to ocular surface, eyelid speculum. A (32g) needle was used.   Injection: 1.25 mg Bevacizumab 1.25mg /0.95ml   Route: Intravitreal, Site: Right Eye   NDC: H061816, Lot: [id number], Expiration date: 10/16/2021, Waste: 0.05 mL   Post-op Post injection exam found visual acuity of at least counting fingers. The patient tolerated the procedure well. There were no complications. The patient received written and verbal post procedure care education. Post injection medications were not given.      Intravitreal Injection, Pharmacologic Agent - OS - Left Eye       Time Out 09/13/2021. 3:47 PM. Confirmed correct patient, procedure, site, and patient consented.   Anesthesia Topical anesthesia was used. Anesthetic medications included Lidocaine 2%, Proparacaine 0.5%.   Procedure Preparation included eyelid speculum, 5% betadine to ocular surface. A (32g) needle was used.   Injection: 1.25 mg Bevacizumab 1.25mg /0.27ml   Route: Intravitreal, Site: Left Eye   NDC: H061816, Lot: [id number], Expiration date: 11/16/2021, Waste: 0.05 mL   Post-op Post  injection exam found visual acuity of at least counting fingers. The patient tolerated the procedure well. There were no complications. The patient received written and verbal post procedure care education. Post injection medications were not given.            ASSESSMENT/PLAN:    ICD-10-CM   1. Exudative age-related macular degeneration of right eye with active choroidal neovascularization (HCC)  H35.3211 Intravitreal Injection, Pharmacologic Agent - OD - Right Eye    Bevacizumab (AVASTIN) SOLN 1.25 mg    2. Exudative age-related macular degeneration of left eye with active choroidal neovascularization (HCC)  H35.3221 Intravitreal Injection, Pharmacologic Agent - OS - Left Eye    Bevacizumab (AVASTIN) SOLN 1.25 mg    3. Retinal edema  H35.81 OCT, Retina - OU - Both Eyes    4. Essential hypertension  I10     5. Hypertensive retinopathy of both eyes  H35.033     6. Combined forms of age-related cataract of both eyes  H25.813      1,2. Exudative age related macular degeneration OD  - pt initially presented w/ 2 wk history of decreased vision OD  - history of intravitreal injections OS with Dr. Zadie Rhine in 2017 and earlier -- pt reports stopping visits after the development of a corneal abrasion from a lid speculum.  - s/p IVA OD #1 (12.21.21), #2 (01.24.22), #3 (2.21.22), #4 (03.22.22), #5 (4.26.22), #6 (  5.31.22), #7 (7.12.22), #8 (8.15.22)  - OCT shows interval improvement in SRF overlying shallow PED/CNVM at 5 weeks  - BCVA OD - improved to 20/30              - Recommend IVA OD #9 today, 9.20.22 w/ f/u in 5 wks  - pt wishes to be treated with IVA  - RBA of procedure discussed, questions answered  - informed consent obtained and signed  - see procedure note  - f/u in 5 wks -- DFE/OCT, possible injection   3. Exudative age related macular degeneration, OS  - OS with history inactive disciform scar, but new focal IRH noted 5.31.22 -- persistent on exam today  - history of  intravitreal injections OS with Dr. Zadie Rhine in 2017 and earlier -- pt reports stopping visits after the development of a corneal abrasion from a lid speculum.  - has not seen a retina specialist since 2017  - OS now with large area of central GA with overlying disciform scar  - exam shows persistent IRH overlying scar  - OCT shows stable disciform scar -- no IRF/SRF  - BCVA CF OS             - s/p IVA OS #1 (05.31.22), #2 (07.12.22), #3 (8.15.22) - recommend IVA OS #4 today, 9.20.22 for persistent IRH - pt wishes to proceed  - RBA of procedure discussed, questions answered  - informed consent obtained and signed - see procedure note - f/u 5 weeks, DFE, OCT  4,5. Hypertensive retinopathy OU - discussed importance of tight BP control - monitor  6. Mixed Cataract OU - The symptoms of cataract, surgical options, and treatments and risks were discussed with patient. - discussed diagnosis and progression - approaching visual significance - monitor  Ophthalmic Meds Ordered this visit:  Meds ordered this encounter  Medications   Bevacizumab (AVASTIN) SOLN 1.25 mg   Bevacizumab (AVASTIN) SOLN 1.25 mg       Return in about 5 weeks (around 10/18/2021) for ex ARMD OU - Dilated Exam, OCT, Possible Injxn.  There are no Patient Instructions on file for this visit.  This document serves as a record of services personally performed by Gardiner Sleeper, MD, PhD. It was created on their behalf by Estill Bakes, COT an ophthalmic technician. The creation of this record is the provider's dictation and/or activities during the visit.    Electronically signed by: Estill Bakes, COT 9.20.22 @ 1:08 AM   Gardiner Sleeper, M.D., Ph.D. Diseases & Surgery of the Retina and Vitreous [facility]  I have reviewed the above documentation for accuracy and completeness, and I agree with the above. Gardiner Sleeper, M.D., Ph.D. 09/14/21 1:08 AM  Abbreviations: M myopia (nearsighted); A  astigmatism; H hyperopia (farsighted); P presbyopia; Mrx spectacle prescription;  CTL contact lenses; OD right eye; OS left eye; OU both eyes  XT exotropia; ET esotropia; PEK punctate epithelial keratitis; PEE punctate epithelial erosions; DES dry eye syndrome; MGD meibomian gland dysfunction; ATs artificial tears; PFAT's preservative free artificial tears; [place] nuclear sclerotic cataract; PSC posterior subcapsular cataract; ERM epi-retinal membrane; PVD posterior vitreous detachment; RD retinal detachment; DM diabetes mellitus; DR diabetic retinopathy; NPDR non-proliferative diabetic retinopathy; PDR proliferative diabetic retinopathy; CSME clinically significant macular edema; DME diabetic macular edema; dbh dot blot hemorrhages; CWS cotton wool spot; POAG primary open angle glaucoma; C/D cup-to-disc ratio; HVF humphrey visual field; GVF goldmann visual field; OCT optical coherence tomography; IOP intraocular pressure; BRVO Branch  retinal vein occlusion; CRVO central retinal vein occlusion; CRAO central retinal artery occlusion; BRAO branch retinal artery occlusion; RT retinal tear; SB scleral buckle; PPV pars plana vitrectomy; VH Vitreous hemorrhage; PRP panretinal laser photocoagulation; IVK intravitreal kenalog; VMT vitreomacular traction; MH Macular hole;  NVD neovascularization of the disc; NVE neovascularization elsewhere; AREDS age related eye disease study; ARMD age related macular degeneration; POAG primary open angle glaucoma; EBMD epithelial/anterior basement membrane dystrophy; ACIOL anterior chamber intraocular lens; IOL intraocular lens; PCIOL posterior chamber intraocular lens; Phaco/IOL phacoemulsification with intraocular lens placement; [place] photorefractive keratectomy; LASIK laser assisted in situ keratomileusis; HTN hypertension; DM diabetes mellitus; COPD chronic obstructive pulmonary disease

## 2021-09-14 ENCOUNTER — Encounter (INDEPENDENT_AMBULATORY_CARE_PROVIDER_SITE_OTHER): Payer: Self-pay | Admitting: Ophthalmology

## 2021-09-14 MED ORDER — BEVACIZUMAB CHEMO INJECTION 1.25MG/0.05ML SYRINGE FOR KALEIDOSCOPE
1.2500 mg | INTRAVITREAL | Status: AC | PRN
  Administered 2021-09-13: 1.25 mg via INTRAVITREAL

## 2021-09-21 DIAGNOSIS — Z23 Encounter for immunization: Secondary | ICD-10-CM | POA: Diagnosis not present

## 2021-09-21 DIAGNOSIS — G4700 Insomnia, unspecified: Secondary | ICD-10-CM | POA: Diagnosis not present

## 2021-09-21 DIAGNOSIS — M170 Bilateral primary osteoarthritis of knee: Secondary | ICD-10-CM | POA: Diagnosis not present

## 2021-09-21 DIAGNOSIS — J439 Emphysema, unspecified: Secondary | ICD-10-CM | POA: Diagnosis not present

## 2021-09-21 DIAGNOSIS — I509 Heart failure, unspecified: Secondary | ICD-10-CM | POA: Diagnosis not present

## 2021-09-21 DIAGNOSIS — E039 Hypothyroidism, unspecified: Secondary | ICD-10-CM | POA: Diagnosis not present

## 2021-09-21 DIAGNOSIS — I700 Atherosclerosis of aorta: Secondary | ICD-10-CM | POA: Diagnosis not present

## 2021-09-21 DIAGNOSIS — I10 Essential (primary) hypertension: Secondary | ICD-10-CM | POA: Diagnosis not present

## 2021-09-21 DIAGNOSIS — K219 Gastro-esophageal reflux disease without esophagitis: Secondary | ICD-10-CM | POA: Diagnosis not present

## 2021-09-21 DIAGNOSIS — F322 Major depressive disorder, single episode, severe without psychotic features: Secondary | ICD-10-CM | POA: Diagnosis not present

## 2021-09-21 DIAGNOSIS — E785 Hyperlipidemia, unspecified: Secondary | ICD-10-CM | POA: Diagnosis not present

## 2021-10-12 NOTE — Progress Notes (Signed)
[facility] Note  10/18/2021     CHIEF COMPLAINT Patient presents for Retina Follow Up   HISTORY OF PRESENT ILLNESS: Kimberly Waters is a 75 y.o. female who presents to the clinic today for:  HPI     Retina Follow Up   Patient presents with  Wet AMD.  In right eye.  Severity is mild.  Duration of 5 weeks.  Since onset it is stable.  I, the attending physician,  performed the HPI with the patient and updated documentation appropriately.        Comments   Pt here for 5 wk ret f/u for exu ARMD OS. Pt states vision is the same, no changes noted. No ocular pain or discomfort.       Last edited by Bernarda Caffey, MD on 10/18/2021 11:16 PM.     Referring physician: Josetta Huddle, MD [address] [place],  [place] [address]  HISTORICAL INFORMATION:   Selected notes from the MEDICAL RECORD NUMBER Referred by Dr. Marin Comment for eval of decreased VA OD.   CURRENT MEDICATIONS: No current outpatient medications on file. (Ophthalmic Drugs)   No current facility-administered medications for this visit. (Ophthalmic Drugs)   Current Outpatient Medications (Other)  Medication Sig   albuterol (PROVENTIL,VENTOLIN) 90 MCG/ACT inhaler Inhale 2 puffs into the lungs every 4 (four) hours as needed.   albuterol (VENTOLIN HFA) 108 (90 Base) MCG/ACT inhaler Inhale 2 puffs into the lungs every 4 (four) hours as needed.   anastrozole (ARIMIDEX) 1 MG tablet Take 1 tablet (1 mg total) by mouth daily. Stop May 2023   arformoterol (BROVANA) 15 MCG/2ML NEBU Take 2 mLs (15 mcg total) by nebulization 2 (two) times daily.   arformoterol (BROVANA) 15 MCG/2ML NEBU Take 2 mLs (15 mcg total) by nebulization 2 (two) times daily.   aspirin EC 81 MG EC tablet Take 1 tablet (81 mg total) by mouth daily.   betamethasone dipropionate 0.35 % cream 1 application   budesonide-formoterol (SYMBICORT) 80-4.5 MCG/ACT inhaler    buPROPion (WELLBUTRIN SR) 150 MG 12 hr tablet Take 150 mg by  mouth daily.    clobetasol ointment (TEMOVATE) 0.05 % as needed.   diphenhydrAMINE (BENADRYL) 25 MG tablet Take 25 mg by mouth as needed for sleep.   escitalopram (LEXAPRO) 10 MG tablet Take 1 tablet (10 mg total) by mouth daily.   ezetimibe (ZETIA) 10 MG tablet TAKE 1 TABLET BY MOUTH EVERY DAY   fluconazole (DIFLUCAN) 100 MG tablet Take 1 tablet (100 mg total) by mouth daily.   furosemide (LASIX) 40 MG tablet Take 1 tablet (40 mg total) by mouth daily.   hydrOXYzine (ATARAX/VISTARIL) 25 MG tablet Take 25 mg by mouth at bedtime.   imipramine (TOFRANIL) 50 MG tablet Take 50 mg by mouth at bedtime.   ipratropium (ATROVENT) 0.06 % nasal spray Place 2 sprays into the nose 4 (four) times daily.   ketoconazole (NIZORAL) 2 % cream APPLY TO AFFECTED AREA EVERY DAY   ketoconazole (NIZORAL) 2 % cream Apply 1 application topically daily.   levocetirizine (XYZAL) 5 MG tablet Take 5 mg by mouth as needed for allergies.   levothyroxine (SYNTHROID, LEVOTHROID) 112 MCG tablet TAKE ONE TABLET BY MOUTH ONCE DAILY   meloxicam (MOBIC) 7.5 MG tablet Take 7.5 mg by mouth at bedtime.   mometasone (ELOCON) 0.1 % ointment Apply topically as needed.   nitroGLYCERIN (NITROSTAT) 0.4 MG SL tablet as needed.   omeprazole (PRILOSEC) 20 MG  capsule Take 20 mg by mouth daily.   Potassium Chloride ER 20 MEQ TBCR 1 tablet with food   potassium chloride SA (K-DUR,KLOR-CON) 20 MEQ tablet Take 1 tablet (20 mEq total) by mouth daily.   predniSONE (DELTASONE) 10 MG tablet Take by mouth.   revefenacin (YUPELRI) 175 MCG/3ML nebulizer solution Take 3 mLs (175 mcg total) by nebulization daily.   revefenacin (YUPELRI) 175 MCG/3ML nebulizer solution Take 3 mLs (175 mcg total) by nebulization daily.   rosuvastatin (CRESTOR) 20 MG tablet TAKE 1 TABLET BY MOUTH EVERY DAY   simvastatin (ZOCOR) 20 MG tablet Take 1 tablet by mouth every evening.   tacrolimus (PROTOPIC) 0.1 % ointment Apply topically as needed.   Tiotropium Bromide-Olodaterol  (STIOLTO RESPIMAT) 2.5-2.5 MCG/ACT AERS Inhale 2 puffs into the lungs daily.   Tiotropium Bromide-Olodaterol (STIOLTO RESPIMAT) 2.5-2.5 MCG/ACT AERS Inhale 2 puffs into the lungs daily.   No current facility-administered medications for this visit. (Other)   REVIEW OF SYSTEMS: ROS   Positive for: Neurological, Eyes, Respiratory Negative for: Constitutional, Gastrointestinal, Skin, Genitourinary, Musculoskeletal, HENT, Endocrine, Cardiovascular, Psychiatric, Allergic/Imm, Heme/Lymph Last edited by Kingsley Spittle, COT on 10/18/2021  1:53 PM.     ALLERGIES Allergies  Allergen Reactions   Latex Other (See Comments)    Other reaction(s): Other (See Comments) Tears skin.   Adhesive [Tape] Other (See Comments)    (skin tears)  Tolerates PAPER TAPE   Codeine Nausea Only   Other Nausea Only    Anesthesia--nausea    PAST MEDICAL HISTORY Past Medical History:  Diagnosis Date   Allergy    Breast cancer ([place])    Cataract    Colon polyps    Depression    Dyspnea    SEASONAL   Family history of breast cancer    Family history of colon cancer    GERD (gastroesophageal reflux disease)    History of radiation therapy 06/05/17-07/20/17   right chest wall 50.4 Gy in 28 fractions, right axillary nodal region 45 Gy in 25 fractions   Hyperlipidemia    Hypertension    Hypertensive retinopathy    Hypothyroidism    Macular degeneration    Peripheral neuropathy 06/18/2019   PONV (postoperative nausea and vomiting)    Past Surgical History:  Procedure Laterality Date   ABDOMINAL HYSTERECTOMY     KNEE SURGERY Right    MASTECTOMY W/ SENTINEL NODE BIOPSY Right 03/05/2017   Procedure: BILATERAL TOTAL MASTECTOMIES WITH RIGHT SENTINEL LYMPH NODE BIOPSIES;  Surgeon: Excell Seltzer, MD;  Location: [facility] OR;  Service: General;  Laterality: Right;   SHOULDER SURGERY     BILAT   FAMILY HISTORY Family History  Problem Relation Age of Onset   Heart disease Mother 45       stents   Colon  cancer Maternal Grandfather 59   Colon cancer Maternal Aunt 65   Colon cancer Maternal Uncle        dx in his 110s   Breast cancer Paternal Aunt 39   Breast cancer Cousin 21       paternal first cousin   Lung cancer Paternal Grandfather    Colon cancer Maternal Uncle    Head & neck cancer Maternal Uncle        oral cancer   SOCIAL HISTORY Social History   Tobacco Use   Smoking status: Former    Packs/day: 1.00    Years: 45.00    Pack years: 45.00    Types: Cigarettes    Quit date: 02/22/2017  Years since quitting: 4.6   Smokeless tobacco: Never  Vaping Use   Vaping Use: Never used  Substance Use Topics   Alcohol use: No    Alcohol/week: 0.0 standard drinks   Drug use: No       OPHTHALMIC EXAM: Base Eye Exam     Visual Acuity (Snellen - Linear)       Right Left   Dist cc 20/30 CF at 3'   Dist ph cc NI NI    Correction: Glasses         Tonometry (Tonopen, 1:59 PM)       Right Left   Pressure 13 15         Pupils       Dark Light Shape React APD   Right 3 2 Round Brisk None   Left 3 2 Round Brisk None         Visual Fields       Left Right     Full   Restrictions Partial inner superior temporal, inferior temporal, superior nasal, inferior nasal deficiencies          Extraocular Movement       Right Left    Full, Ortho Full, Ortho         Neuro/Psych     Oriented x3: Yes   Mood/Affect: Normal         Dilation     Both eyes: 1.0% Mydriacyl, 2.5% Phenylephrine @ 2:00 PM           Slit Lamp and Fundus Exam     Slit Lamp Exam       Right Left   Lids/Lashes Dermatochalasis - upper lid Dermatochalasis - upper lid   Conjunctiva/Sclera nasal and temporal pinguecula nasal and temporal pinguecula   Cornea 2+ Punctate epithelial erosions, irregular epi, arcus, decreased TBUT 2+ Punctate epithelial erosions, irregular epi, arcus, decreased TBUT   Anterior Chamber deep, narrow temporal angle deep, narrow temporal angle   Iris  Round and moderately dilated, mild anterior bowing Round and moderately dilated, mild anterior bowing   Lens 2-3+ Nuclear sclerosis with early brunescence, 2-3+ Cortical cataract 2-3+ Nuclear sclerosis with early brunescence, 2-3+ Cortical cataract   Vitreous Vitreous syneresis, Posterior vitreous detachment Vitreous syneresis, Posterior vitreous detachment         Fundus Exam       Right Left   Disc Pink and Sharp, Compact, mild PPA Mild Pallor, Sharp rim, +elevation   C/D Ratio 0.0 0.1   Macula Blunted foveal reflex, +CNV with pigment clumping/ring, heme stably improved, SRF -- improved, RPE mottling and clumping, [place] Blunted foveal reflex, large area of GA with disciform scar and subretinal fibrosis, central pigment clumping, punctate heme IT to fovea -- resolved   Vessels attenuated, mild tortuousity attenuated, Tortuous   Periphery Attached, scattered reticular degeneration, paving stone degeneration Attached, scattered reticular degeneration, scattered paving stone degeneration           Refraction     Wearing Rx       Sphere Cylinder Axis   Right -6.50 +2.00 152   Left -5.25 +1.00 018    Type: SVL           IMAGING AND PROCEDURES  Imaging and Procedures for 10/18/2021  OCT, Retina - OU - Both Eyes       Right Eye Quality was good. Central Foveal Thickness: 339. Progression has improved. Findings include abnormal foveal contour, subretinal hyper-reflective material, pigment epithelial detachment, choroidal neovascular membrane,  no IRF, no SRF (Stable improvement in SRF overlying PED/SRHM).   Left Eye Quality was good. Central Foveal Thickness: 256. Progression has been stable. Findings include abnormal foveal contour, no IRF, no SRF, subretinal hyper-reflective material, outer retinal atrophy, lamellar hole (large central disciform scar with ORA, Foveal notch, stable improvement in trace cystic changes and temporal edema).   Notes *Images captured and stored  on drive  Diagnosis / Impression:  exu ARMD OU OD Stable improvement in SRF overlying PED/SRHM OS inactive disciform scar with stable improvement in trace cystic changes and temporal edema  Clinical management:  See below  Abbreviations: NFP - Normal foveal profile. CME - cystoid macular edema. PED - pigment epithelial detachment. IRF - intraretinal fluid. SRF - subretinal fluid. EZ - ellipsoid zone. ERM - epiretinal membrane. ORA - outer retinal atrophy. ORT - outer retinal tubulation. SRHM - subretinal hyper-reflective material. IRHM - intraretinal hyper-reflective material      Intravitreal Injection, Pharmacologic Agent - OD - Right Eye       Time Out 10/18/2021. 3:08 PM. Confirmed correct patient, procedure, site, and patient consented.   Anesthesia Topical anesthesia was used. Anesthetic medications included Lidocaine 2%, Proparacaine 0.5%.   Procedure Preparation included 5% betadine to ocular surface, eyelid speculum. A supplied needle was used.   Injection: 1.25 mg Bevacizumab 1.26m/0.05ml   Route: Intravitreal, Site: Right Eye   NDC:: 38756-433-29 Lot: [id number] , Expiration date: 12/08/2021, Waste: 0 mL   Post-op Post injection exam found visual acuity of at least counting fingers. The patient tolerated the procedure well. There were no complications. The patient received written and verbal post procedure care education. Post injection medications were not given.            ASSESSMENT/PLAN:   ICD-10-CM   1. Exudative age-related macular degeneration of right eye with active choroidal neovascularization (HCC)  H35.3211 Intravitreal Injection, Pharmacologic Agent - OD - Right Eye    Bevacizumab (AVASTIN) SOLN 1.25 mg    2. Exudative age-related macular degeneration of left eye with active choroidal neovascularization ([place]  H35.3221     3. Retinal edema  H35.81 OCT, Retina - OU - Both Eyes    4. Essential hypertension  I10     5. Hypertensive retinopathy  of both eyes  H35.033     6. Combined forms of age-related cataract of both eyes  H25.813      1,2. Exudative age related macular degeneration OD  - pt initially presented w/ 2 wk history of decreased vision OD  - history of intravitreal injections OS [address] and earlier -- pt reports stopping visits after the development of a corneal abrasion from a lid speculum.  - s/p IVA OD #1 (12.21.21), #2 (01.24.22), #3 (2.21.22), #4 (03.22.22), #5 (4.26.22), #6 (5.31.22), #7 (7.12.22), #8 (8.15.22), #9 (9.20.22)  - OCT shows stable improvement in SRF overlying shallow PED/CNVM at 5 weeks  - BCVA OD 20/30              - Recommend IVA OD #10 today, 10.25.22, w/ ext to 6 wks  - RBA of procedure discussed, questions answered  - informed consent obtained and signed  - see procedure note  - f/u in 6 wks -- DFE/OCT, possible injection   3. Exudative age related macular degeneration, OS  - OS with history inactive disciform scar, but new focal IRH noted 5.31.22 -- resolved on exam today - s/p IVA OS #1 (05.31.22), #2 (07.12.22), #3 (8.15.22), #4 (9.20.22) for focal  [place]  - history of intravitreal injections OS with Dr. Zadie Rhine in 2017 and earlier -- pt reports stopping visits after the development of a corneal abrasion from a lid speculum  - has not seen a retina specialist since 2017  - exam and OCT OS shows stable disciform scar -- no IRF/SRF  - BCVA CF OS - recommend holding IVA OS today - f/u 6 weeks, DFE, OCT  4,5. Hypertensive retinopathy OU - discussed importance of tight BP control - monitor  6. Mixed Cataract OU - The symptoms of cataract, surgical options, and treatments and risks were discussed with patient. - discussed diagnosis and progression - approaching visual significance - monitor  Ophthalmic Meds Ordered this visit:  Meds ordered this encounter  Medications   Bevacizumab (AVASTIN) SOLN 1.25 mg     Return for follow up 6 weeks , DFE, OCT.  There are no  Patient Instructions on file for this visit.  This document serves as a record of services personally performed by Gardiner Sleeper, MD, PhD. It was created on their behalf by Estill Bakes, COT an ophthalmic technician. The creation of this record is the provider's dictation and/or activities during the visit.    Electronically signed by: Estill Bakes, COT 10.25.22 @ 11:29 PM   This document serves as a record of services personally performed by Gardiner Sleeper, MD, PhD. It was created on their behalf by Leonie Douglas, an ophthalmic technician. The creation of this record is the provider's dictation and/or activities during the visit.    Electronically signed by: Leonie Douglas COA, 10/18/21  11:29 PM  Gardiner Sleeper, M.D., Ph.D. Diseases & Surgery of the Retina and Vitreous [facility] 10/18/2021  I have reviewed the above documentation for accuracy and completeness, and I agree with the above. Gardiner Sleeper, M.D., Ph.D. 10/18/21 11:29 PM  Abbreviations: M myopia (nearsighted); A astigmatism; H hyperopia (farsighted); P presbyopia; Mrx spectacle prescription;  CTL contact lenses; OD right eye; OS left eye; OU both eyes  XT exotropia; ET esotropia; PEK punctate epithelial keratitis; PEE punctate epithelial erosions; DES dry eye syndrome; MGD meibomian gland dysfunction; ATs artificial tears; PFAT's preservative free artificial tears; [place] nuclear sclerotic cataract; PSC posterior subcapsular cataract; ERM epi-retinal membrane; PVD posterior vitreous detachment; RD retinal detachment; DM diabetes mellitus; DR diabetic retinopathy; NPDR non-proliferative diabetic retinopathy; PDR proliferative diabetic retinopathy; CSME clinically significant macular edema; DME diabetic macular edema; dbh dot blot hemorrhages; CWS cotton wool spot; POAG primary open angle glaucoma; C/D cup-to-disc ratio; HVF humphrey visual field; GVF goldmann visual field; OCT optical coherence tomography; IOP  intraocular pressure; BRVO Branch retinal vein occlusion; CRVO central retinal vein occlusion; CRAO central retinal artery occlusion; BRAO branch retinal artery occlusion; RT retinal tear; SB scleral buckle; PPV pars plana vitrectomy; VH Vitreous hemorrhage; PRP panretinal laser photocoagulation; IVK intravitreal kenalog; VMT vitreomacular traction; MH Macular hole;  NVD neovascularization of the disc; NVE neovascularization elsewhere; AREDS age related eye disease study; ARMD age related macular degeneration; POAG primary open angle glaucoma; EBMD epithelial/anterior basement membrane dystrophy; ACIOL anterior chamber intraocular lens; IOL intraocular lens; PCIOL posterior chamber intraocular lens; Phaco/IOL phacoemulsification with intraocular lens placement; [place] photorefractive keratectomy; LASIK laser assisted in situ keratomileusis; HTN hypertension; DM diabetes mellitus; COPD chronic obstructive pulmonary disease

## 2021-10-18 ENCOUNTER — Other Ambulatory Visit: Payer: Self-pay

## 2021-10-18 ENCOUNTER — Encounter (INDEPENDENT_AMBULATORY_CARE_PROVIDER_SITE_OTHER): Payer: Self-pay | Admitting: Ophthalmology

## 2021-10-18 ENCOUNTER — Ambulatory Visit (INDEPENDENT_AMBULATORY_CARE_PROVIDER_SITE_OTHER): Payer: Medicare Other | Admitting: Ophthalmology

## 2021-10-18 DIAGNOSIS — H25813 Combined forms of age-related cataract, bilateral: Secondary | ICD-10-CM

## 2021-10-18 DIAGNOSIS — H35033 Hypertensive retinopathy, bilateral: Secondary | ICD-10-CM

## 2021-10-18 DIAGNOSIS — H353221 Exudative age-related macular degeneration, left eye, with active choroidal neovascularization: Secondary | ICD-10-CM

## 2021-10-18 DIAGNOSIS — H3581 Retinal edema: Secondary | ICD-10-CM

## 2021-10-18 DIAGNOSIS — H353231 Exudative age-related macular degeneration, bilateral, with active choroidal neovascularization: Secondary | ICD-10-CM

## 2021-10-18 DIAGNOSIS — H353211 Exudative age-related macular degeneration, right eye, with active choroidal neovascularization: Secondary | ICD-10-CM

## 2021-10-18 DIAGNOSIS — I10 Essential (primary) hypertension: Secondary | ICD-10-CM | POA: Diagnosis not present

## 2021-10-18 MED ORDER — BEVACIZUMAB CHEMO INJECTION 1.25MG/0.05ML SYRINGE FOR KALEIDOSCOPE
1.2500 mg | INTRAVITREAL | Status: AC | PRN
  Administered 2021-10-18: 1.25 mg via INTRAVITREAL

## 2021-10-20 ENCOUNTER — Other Ambulatory Visit: Payer: Medicare Other

## 2021-10-20 ENCOUNTER — Ambulatory Visit: Payer: Medicare Other | Admitting: Oncology

## 2021-11-14 ENCOUNTER — Other Ambulatory Visit: Payer: Self-pay | Admitting: Cardiology

## 2021-11-21 ENCOUNTER — Inpatient Hospital Stay: Payer: Medicare Other | Attending: Oncology

## 2021-11-21 ENCOUNTER — Other Ambulatory Visit: Payer: Self-pay

## 2021-11-21 DIAGNOSIS — R7303 Prediabetes: Secondary | ICD-10-CM | POA: Insufficient documentation

## 2021-11-21 DIAGNOSIS — Z170 Estrogen receptor positive status [ER+]: Secondary | ICD-10-CM | POA: Diagnosis not present

## 2021-11-21 DIAGNOSIS — G629 Polyneuropathy, unspecified: Secondary | ICD-10-CM | POA: Diagnosis not present

## 2021-11-21 DIAGNOSIS — E8881 Metabolic syndrome: Secondary | ICD-10-CM | POA: Insufficient documentation

## 2021-11-21 DIAGNOSIS — C50411 Malignant neoplasm of upper-outer quadrant of right female breast: Secondary | ICD-10-CM | POA: Insufficient documentation

## 2021-11-21 LAB — COMPREHENSIVE METABOLIC PANEL
ALT: 30 U/L (ref 0–44)
AST: 23 U/L (ref 15–41)
Albumin: 4 g/dL (ref 3.5–5.0)
Alkaline Phosphatase: 69 U/L (ref 38–126)
Anion gap: 10 (ref 5–15)
BUN: 17 mg/dL (ref 8–23)
CO2: 27 mmol/L (ref 22–32)
Calcium: 9.3 mg/dL (ref 8.9–10.3)
Chloride: 103 mmol/L (ref 98–111)
Creatinine, Ser: 1.5 mg/dL — ABNORMAL HIGH (ref 0.44–1.00)
GFR, Estimated: 37 mL/min — ABNORMAL LOW (ref >60)
Glucose, Bld: 115 mg/dL — ABNORMAL HIGH (ref 70–99)
Potassium: 3.5 mmol/L (ref 3.5–5.1)
Sodium: 140 mmol/L (ref 135–145)
Total Bilirubin: 0.5 mg/dL (ref 0.3–1.2)
Total Protein: 7.7 g/dL (ref 6.5–8.1)

## 2021-11-21 LAB — CBC WITH DIFFERENTIAL/PLATELET
Abs Immature Granulocytes: 0.02 10*3/uL (ref 0.00–0.07)
Basophils Absolute: 0.1 10*3/uL (ref 0.0–0.1)
Basophils Relative: 1 %
Eosinophils Absolute: 0.2 10*3/uL (ref 0.0–0.5)
Eosinophils Relative: 2 %
HCT: 40.8 % (ref 36.0–46.0)
Hemoglobin: 13.1 g/dL (ref 12.0–15.0)
Immature Granulocytes: 0 %
Lymphocytes Relative: 17 %
Lymphs Abs: 1.5 10*3/uL (ref 0.7–4.0)
MCH: 28.7 pg (ref 26.0–34.0)
MCHC: 32.1 g/dL (ref 30.0–36.0)
MCV: 89.3 fL (ref 80.0–100.0)
Monocytes Absolute: 0.7 10*3/uL (ref 0.1–1.0)
Monocytes Relative: 7 %
Neutro Abs: 6.6 10*3/uL (ref 1.7–7.7)
Neutrophils Relative %: 73 %
Platelets: 289 10*3/uL (ref 150–400)
RBC: 4.57 MIL/uL (ref 3.87–5.11)
RDW: 13 % (ref 11.5–15.5)
WBC: 9 10*3/uL (ref 4.0–10.5)
nRBC: 0 % (ref 0.0–0.2)

## 2021-11-22 LAB — KAPPA/LAMBDA LIGHT CHAINS
Kappa free light chain: 26.7 mg/L — ABNORMAL HIGH (ref 3.3–19.4)
Kappa, lambda light chain ratio: 1.12 (ref 0.26–1.65)
Lambda free light chains: 23.9 mg/L (ref 5.7–26.3)

## 2021-11-25 LAB — MULTIPLE MYELOMA PANEL, SERUM
Albumin SerPl Elph-Mcnc: 3.6 g/dL (ref 2.9–4.4)
Albumin/Glob SerPl: 1.2 (ref 0.7–1.7)
Alpha 1: 0.2 g/dL (ref 0.0–0.4)
Alpha2 Glob SerPl Elph-Mcnc: 1.2 g/dL — ABNORMAL HIGH (ref 0.4–1.0)
B-Globulin SerPl Elph-Mcnc: 1 g/dL (ref 0.7–1.3)
Gamma Glob SerPl Elph-Mcnc: 0.8 g/dL (ref 0.4–1.8)
Globulin, Total: 3.2 g/dL (ref 2.2–3.9)
IgA: 167 mg/dL (ref 64–422)
IgG (Immunoglobin G), Serum: 756 mg/dL (ref 586–1602)
IgM (Immunoglobulin M), Srm: 254 mg/dL — ABNORMAL HIGH (ref 26–217)
M Protein SerPl Elph-Mcnc: 0.2 g/dL — ABNORMAL HIGH
Total Protein ELP: 6.8 g/dL (ref 6.0–8.5)

## 2021-11-25 NOTE — Progress Notes (Signed)
[facility] Note  11/29/2021     CHIEF COMPLAINT Patient presents for Retina Follow Up  HISTORY OF PRESENT ILLNESS: Kimberly Waters is a 75 y.o. female who presents to the clinic today for:  HPI     Retina Follow Up   Patient presents with  Wet AMD.  In right eye.  Severity is moderate.  Duration of 6 weeks.  Since onset it is stable.  I, the attending physician,  performed the HPI with the patient and updated documentation appropriately.        Comments   Patient states vision the same OU.       Last edited by Bernarda Caffey, MD on 11/29/2021 11:53 PM.      Referring physician: Josetta Huddle, MD [address] [place],  [place] [address]  HISTORICAL INFORMATION:   Selected notes from the MEDICAL RECORD NUMBER Referred by Dr. Marin Comment for eval of decreased VA OD.   CURRENT MEDICATIONS: No current outpatient medications on file. (Ophthalmic Drugs)   No current facility-administered medications for this visit. (Ophthalmic Drugs)   Current Outpatient Medications (Other)  Medication Sig   albuterol (PROVENTIL,VENTOLIN) 90 MCG/ACT inhaler Inhale 2 puffs into the lungs every 4 (four) hours as needed.   albuterol (VENTOLIN HFA) 108 (90 Base) MCG/ACT inhaler Inhale 2 puffs into the lungs every 4 (four) hours as needed.   anastrozole (ARIMIDEX) 1 MG tablet Take 1 tablet (1 mg total) by mouth daily. Stop May 2023   arformoterol (BROVANA) 15 MCG/2ML NEBU Take 2 mLs (15 mcg total) by nebulization 2 (two) times daily.   arformoterol (BROVANA) 15 MCG/2ML NEBU Take 2 mLs (15 mcg total) by nebulization 2 (two) times daily.   aspirin EC 81 MG EC tablet Take 1 tablet (81 mg total) by mouth daily.   betamethasone dipropionate 1.01 % cream 1 application   budesonide-formoterol (SYMBICORT) 80-4.5 MCG/ACT inhaler    buPROPion (WELLBUTRIN SR) 150 MG 12 hr tablet Take 150 mg by mouth daily.    clobetasol ointment (TEMOVATE) 0.05 % as needed.    diphenhydrAMINE (BENADRYL) 25 MG tablet Take 25 mg by mouth as needed for sleep.   escitalopram (LEXAPRO) 10 MG tablet Take 1 tablet (10 mg total) by mouth daily.   ezetimibe (ZETIA) 10 MG tablet TAKE 1 TABLET BY MOUTH EVERY DAY   fluconazole (DIFLUCAN) 100 MG tablet Take 1 tablet (100 mg total) by mouth daily.   furosemide (LASIX) 40 MG tablet Take 1 tablet (40 mg total) by mouth daily.   hydrOXYzine (ATARAX/VISTARIL) 25 MG tablet Take 25 mg by mouth at bedtime.   imipramine (TOFRANIL) 50 MG tablet Take 50 mg by mouth at bedtime.   ipratropium (ATROVENT) 0.06 % nasal spray Place 2 sprays into the nose 4 (four) times daily.   ketoconazole (NIZORAL) 2 % cream APPLY TO AFFECTED AREA EVERY DAY   ketoconazole (NIZORAL) 2 % cream Apply 1 application topically daily.   levocetirizine (XYZAL) 5 MG tablet Take 5 mg by mouth as needed for allergies.   levothyroxine (SYNTHROID, LEVOTHROID) 112 MCG tablet TAKE ONE TABLET BY MOUTH ONCE DAILY   meloxicam (MOBIC) 7.5 MG tablet Take 7.5 mg by mouth at bedtime.   mometasone (ELOCON) 0.1 % ointment Apply topically as needed.   nitroGLYCERIN (NITROSTAT) 0.4 MG SL tablet as needed.   omeprazole (PRILOSEC) 20 MG capsule Take 20 mg by mouth daily.   Potassium Chloride ER 20 MEQ TBCR 1 tablet with food  potassium chloride SA (K-DUR,KLOR-CON) 20 MEQ tablet Take 1 tablet (20 mEq total) by mouth daily.   revefenacin (YUPELRI) 175 MCG/3ML nebulizer solution Take 3 mLs (175 mcg total) by nebulization daily.   revefenacin (YUPELRI) 175 MCG/3ML nebulizer solution Take 3 mLs (175 mcg total) by nebulization daily.   rosuvastatin (CRESTOR) 20 MG tablet TAKE 1 TABLET BY MOUTH EVERY DAY   simvastatin (ZOCOR) 20 MG tablet Take 1 tablet by mouth every evening.   tacrolimus (PROTOPIC) 0.1 % ointment Apply topically as needed.   Tiotropium Bromide-Olodaterol (STIOLTO RESPIMAT) 2.5-2.5 MCG/ACT AERS Inhale 2 puffs into the lungs daily.   Tiotropium Bromide-Olodaterol (STIOLTO  RESPIMAT) 2.5-2.5 MCG/ACT AERS Inhale 2 puffs into the lungs daily.   predniSONE (DELTASONE) 10 MG tablet Take by mouth. (Patient not taking: Reported on 11/29/2021)   No current facility-administered medications for this visit. (Other)   REVIEW OF SYSTEMS: ROS   Positive for: Neurological, Eyes, Respiratory Negative for: Constitutional, Gastrointestinal, Skin, Genitourinary, Musculoskeletal, HENT, Endocrine, Cardiovascular, Psychiatric, Allergic/Imm, Heme/Lymph Last edited by Roselee Nova D, COT on 11/29/2021  2:17 PM.      ALLERGIES Allergies  Allergen Reactions   Latex Other (See Comments)    Other reaction(s): Other (See Comments) Tears skin.   Adhesive [Tape] Other (See Comments)    (skin tears)  Tolerates PAPER TAPE   Codeine Nausea Only   Other Nausea Only    Anesthesia--nausea    PAST MEDICAL HISTORY Past Medical History:  Diagnosis Date   Allergy    Breast cancer ([place])    Cataract    Colon polyps    Depression    Dyspnea    SEASONAL   Family history of breast cancer    Family history of colon cancer    GERD (gastroesophageal reflux disease)    History of radiation therapy 06/05/17-07/20/17   right chest wall 50.4 Gy in 28 fractions, right axillary nodal region 45 Gy in 25 fractions   Hyperlipidemia    Hypertension    Hypertensive retinopathy    Hypothyroidism    Macular degeneration    Peripheral neuropathy 06/18/2019   PONV (postoperative nausea and vomiting)    Past Surgical History:  Procedure Laterality Date   ABDOMINAL HYSTERECTOMY     KNEE SURGERY Right    MASTECTOMY W/ SENTINEL NODE BIOPSY Right 03/05/2017   Procedure: BILATERAL TOTAL MASTECTOMIES WITH RIGHT SENTINEL LYMPH NODE BIOPSIES;  Surgeon: Excell Seltzer, MD;  Location: [facility] OR;  Service: General;  Laterality: Right;   SHOULDER SURGERY     BILAT   FAMILY HISTORY Family History  Problem Relation Age of Onset   Heart disease Mother 26       stents   Colon cancer Maternal Grandfather  59   Colon cancer Maternal Aunt 65   Colon cancer Maternal Uncle        dx in his 51s   Breast cancer Paternal Aunt 58   Breast cancer Cousin 63       paternal first cousin   Lung cancer Paternal Grandfather    Colon cancer Maternal Uncle    Head & neck cancer Maternal Uncle        oral cancer   SOCIAL HISTORY Social History   Tobacco Use   Smoking status: Former    Packs/day: 1.00    Years: 45.00    Pack years: 45.00    Types: Cigarettes    Quit date: 02/22/2017    Years since quitting: 4.7   Smokeless tobacco: Never  Vaping  Use   Vaping Use: Never used  Substance Use Topics   Alcohol use: No    Alcohol/week: 0.0 standard drinks   Drug use: No       OPHTHALMIC EXAM: Base Eye Exam     Visual Acuity (Snellen - Linear)       Right Left   Dist cc 20/30 -1 CF at 3'   Dist ph cc NI NI    Correction: Glasses         Tonometry (Tonopen, 2:25 PM)       Right Left   Pressure 19 21         Pupils       Dark Light Shape React APD   Right 3 2 Round Brisk None   Left 3 3 Round Minimal +1         Visual Fields       Left Right     Full   Restrictions Partial inner superior temporal, inferior temporal, superior nasal, inferior nasal deficiencies          Extraocular Movement       Right Left    Full, Ortho Full, Ortho         Neuro/Psych     Oriented x3: Yes   Mood/Affect: Normal         Dilation     Both eyes: 1.0% Mydriacyl, 2.5% Phenylephrine @ 2:25 PM           Slit Lamp and Fundus Exam     Slit Lamp Exam       Right Left   Lids/Lashes Dermatochalasis - upper lid Dermatochalasis - upper lid   Conjunctiva/Sclera nasal and temporal pinguecula nasal and temporal pinguecula   Cornea 2+ Punctate epithelial erosions, irregular epi, arcus, decreased TBUT 2+ Punctate epithelial erosions, irregular epi, arcus, decreased TBUT   Anterior Chamber deep, narrow temporal angle deep, narrow temporal angle   Iris Round and moderately  dilated, mild anterior bowing Round and moderately dilated, mild anterior bowing   Lens 2-3+ Nuclear sclerosis with early brunescence, 2-3+ Cortical cataract 2-3+ Nuclear sclerosis with early brunescence, 2-3+ Cortical cataract   Anterior Vitreous Vitreous syneresis, Posterior vitreous detachment Vitreous syneresis, Posterior vitreous detachment         Fundus Exam       Right Left   Disc Pink and Sharp, Compact, mild PPA Mild Pallor, Sharp rim, +elevation   C/D Ratio 0.0 0.1   Macula Blunted foveal reflex, +CNV with pigment clumping/ring, heme stably improved, trace shallow SRF recurring, RPE mottling and clumping, Drusen Blunted foveal reflex, large area of GA with disciform scar and subretinal fibrosis, central pigment clumping, no heme   Vessels attenuated, mild tortuousity attenuated, Tortuous   Periphery Attached, scattered reticular degeneration, paving stone degeneration Attached, scattered reticular degeneration, scattered paving stone degeneration           Refraction     Wearing Rx       Sphere Cylinder Axis   Right -6.50 +2.00 152   Left -5.25 +1.00 018    Type: SVL           IMAGING AND PROCEDURES  Imaging and Procedures for 11/29/2021  OCT, Retina - OU - Both Eyes       Right Eye Quality was good. Central Foveal Thickness: 339. Progression has worsened. Findings include abnormal foveal contour, subretinal hyper-reflective material, pigment epithelial detachment, choroidal neovascular membrane, no IRF, no SRF (Interval re-development of SRF overlying PED/SRHM -- very shallow).  Left Eye Quality was good. Central Foveal Thickness: 299. Progression has been stable. Findings include abnormal foveal contour, no IRF, no SRF, subretinal hyper-reflective material, outer retinal atrophy, lamellar hole (large central disciform scar with ORA, Foveal notch, stable trace cystic changes and temporal edema).   Notes *Images captured and stored on drive  Diagnosis /  Impression:  exu ARMD OU OD: Interval re-development of SRF overlying PED/SRHM -- very shallow OS: large central disciform scar with ORA, Foveal notch, stable trace cystic changes and temporal edema  Clinical management:  See below  Abbreviations: NFP - Normal foveal profile. CME - cystoid macular edema. PED - pigment epithelial detachment. IRF - intraretinal fluid. SRF - subretinal fluid. EZ - ellipsoid zone. ERM - epiretinal membrane. ORA - outer retinal atrophy. ORT - outer retinal tubulation. SRHM - subretinal hyper-reflective material. IRHM - intraretinal hyper-reflective material      Intravitreal Injection, Pharmacologic Agent - OD - Right Eye       Time Out 11/29/2021. 3:13 PM. Confirmed correct patient, procedure, site, and patient consented.   Anesthesia Topical anesthesia was used. Anesthetic medications included Lidocaine 2%, Proparacaine 0.5%.   Procedure Preparation included 5% betadine to ocular surface, eyelid speculum. A supplied needle was used.   Injection: 1.25 mg Bevacizumab 1.25mg /0.69ml   Route: Intravitreal, Site: Right Eye   NDC: H061816, Lot: [id number] , Expiration date: 01/03/2022, Waste: 0 mL   Post-op Post injection exam found visual acuity of at least counting fingers. The patient tolerated the procedure well. There were no complications. The patient received written and verbal post procedure care education. Post injection medications were not given.             ASSESSMENT/PLAN:   ICD-10-CM   1. Exudative age-related macular degeneration of right eye with active choroidal neovascularization (HCC)  H35.3211 Intravitreal Injection, Pharmacologic Agent - OD - Right Eye    Bevacizumab (AVASTIN) SOLN 1.25 mg    2. Exudative age-related macular degeneration of left eye with active choroidal neovascularization ([place])  H35.3221     3. Retinal edema  H35.81 OCT, Retina - OU - Both Eyes    4. Essential hypertension  I10     5. Hypertensive  retinopathy of both eyes  H35.033     6. Combined forms of age-related cataract of both eyes  H25.813       1,2. Exudative age related macular degeneration OD  - pt initially presented w/ 2 wk history of decreased vision OD  - history of intravitreal injections OS with Dr. [address] in 2017 and earlier -- pt reports stopping visits after the development of a corneal abrasion from a lid speculum.  - s/p IVA OD #1 (12.21.21), #2 (01.24.22), #3 (2.21.22), #4 (03.22.22), #5 (4.26.22), #6 (5.31.22), #7 (7.12.22), #8 (8.15.22), #9 (9.20.22), #10 (10.25.22)  - OCT shows OD: Interval re-development of SRF overlying PED/SRHM -- very shallow at 6 weeks  - BCVA OD 20/30              - Recommend IVA OD #11 today, 21.6.22, w/ interval back to 5 wks  - RBA of procedure discussed, questions answered  - informed consent obtained and signed  - see procedure note  - f/u in 5 wks -- DFE/OCT, possible injection   3. Exudative age related macular degeneration, OS  - OS with history inactive disciform scar, but new focal IRH noted 5.31.22 -- resolved on exam today - s/p IVA OS #1 (05.31.22), #2 (07.12.22), #3 (8.15.22), #4 (9.20.22) for focal IRH  -  history of intravitreal injections OS with Dr. Zadie Rhine in 2017 and earlier -- pt reports stopping visits after the development of a corneal abrasion from a lid speculum  - has not seen a retina specialist since 2017  - exam and OCT OS shows stable disciform scar -- no IRF/SRF  - BCVA CF OS - recommend holding IVA OS today - f/u 5 weeks, DFE, OCT  4,5. Hypertensive retinopathy OU - discussed importance of tight BP control - monitor  6. Mixed Cataract OU - The symptoms of cataract, surgical options, and treatments and risks were discussed with patient. - discussed diagnosis and progression - approaching visual significance - monitor  Ophthalmic Meds Ordered this visit:  Meds ordered this encounter  Medications   Bevacizumab (AVASTIN) SOLN 1.25 mg       Return in about 5 weeks (around 01/03/2022) for f/u exu ARMD OD, DFE, OCT.  There are no Patient Instructions on file for this visit.  This document serves as a record of services personally performed by Gardiner Sleeper, MD, PhD. It was created on their behalf by Estill Bakes, COT an ophthalmic technician. The creation of this record is the provider's dictation and/or activities during the visit.    Electronically signed by: Estill Bakes, COT 12.2.22 @ 12:06 AM   Gardiner Sleeper, M.D., Ph.D. Diseases & Surgery of the Retina and Vitreous [facility]  I have reviewed the above documentation for accuracy and completeness, and I agree with the above. Gardiner Sleeper, M.D., Ph.D. 11/30/21 12:06 AM  Abbreviations: M myopia (nearsighted); A astigmatism; H hyperopia (farsighted); P presbyopia; Mrx spectacle prescription;  CTL contact lenses; OD right eye; OS left eye; OU both eyes  XT exotropia; ET esotropia; PEK punctate epithelial keratitis; PEE punctate epithelial erosions; DES dry eye syndrome; MGD meibomian gland dysfunction; ATs artificial tears; PFAT's preservative free artificial tears; Shannon nuclear sclerotic cataract; PSC posterior subcapsular cataract; ERM epi-retinal membrane; PVD posterior vitreous detachment; RD retinal detachment; DM diabetes mellitus; DR diabetic retinopathy; NPDR non-proliferative diabetic retinopathy; PDR proliferative diabetic retinopathy; CSME clinically significant macular edema; DME diabetic macular edema; dbh dot blot hemorrhages; CWS cotton wool spot; POAG primary open angle glaucoma; C/D cup-to-disc ratio; HVF humphrey visual field; GVF goldmann visual field; OCT optical coherence tomography; IOP intraocular pressure; BRVO Branch retinal vein occlusion; CRVO central retinal vein occlusion; CRAO central retinal artery occlusion; BRAO branch retinal artery occlusion; RT retinal tear; SB scleral buckle; PPV pars plana vitrectomy; VH Vitreous  hemorrhage; PRP panretinal laser photocoagulation; IVK intravitreal kenalog; VMT vitreomacular traction; MH Macular hole;  NVD neovascularization of the disc; NVE neovascularization elsewhere; AREDS age related eye disease study; ARMD age related macular degeneration; POAG primary open angle glaucoma; EBMD epithelial/anterior basement membrane dystrophy; ACIOL anterior chamber intraocular lens; IOL intraocular lens; PCIOL posterior chamber intraocular lens; Phaco/IOL phacoemulsification with intraocular lens placement; Simpson photorefractive keratectomy; LASIK laser assisted in situ keratomileusis; HTN hypertension; DM diabetes mellitus; COPD chronic obstructive pulmonary disease

## 2021-11-29 ENCOUNTER — Other Ambulatory Visit: Payer: Self-pay

## 2021-11-29 ENCOUNTER — Ambulatory Visit (INDEPENDENT_AMBULATORY_CARE_PROVIDER_SITE_OTHER): Payer: Medicare Other | Admitting: Ophthalmology

## 2021-11-29 ENCOUNTER — Encounter (INDEPENDENT_AMBULATORY_CARE_PROVIDER_SITE_OTHER): Payer: Self-pay | Admitting: Ophthalmology

## 2021-11-29 DIAGNOSIS — H35033 Hypertensive retinopathy, bilateral: Secondary | ICD-10-CM | POA: Diagnosis not present

## 2021-11-29 DIAGNOSIS — I10 Essential (primary) hypertension: Secondary | ICD-10-CM

## 2021-11-29 DIAGNOSIS — H25813 Combined forms of age-related cataract, bilateral: Secondary | ICD-10-CM | POA: Diagnosis not present

## 2021-11-29 DIAGNOSIS — H3581 Retinal edema: Secondary | ICD-10-CM

## 2021-11-29 DIAGNOSIS — H353231 Exudative age-related macular degeneration, bilateral, with active choroidal neovascularization: Secondary | ICD-10-CM | POA: Diagnosis not present

## 2021-11-29 DIAGNOSIS — H353211 Exudative age-related macular degeneration, right eye, with active choroidal neovascularization: Secondary | ICD-10-CM

## 2021-11-29 DIAGNOSIS — H353221 Exudative age-related macular degeneration, left eye, with active choroidal neovascularization: Secondary | ICD-10-CM

## 2021-11-30 DIAGNOSIS — H353231 Exudative age-related macular degeneration, bilateral, with active choroidal neovascularization: Secondary | ICD-10-CM

## 2021-11-30 DIAGNOSIS — H35033 Hypertensive retinopathy, bilateral: Secondary | ICD-10-CM | POA: Diagnosis not present

## 2021-11-30 DIAGNOSIS — H25813 Combined forms of age-related cataract, bilateral: Secondary | ICD-10-CM | POA: Diagnosis not present

## 2021-11-30 DIAGNOSIS — I10 Essential (primary) hypertension: Secondary | ICD-10-CM | POA: Diagnosis not present

## 2021-11-30 MED ORDER — BEVACIZUMAB CHEMO INJECTION 1.25MG/0.05ML SYRINGE FOR KALEIDOSCOPE
1.2500 mg | INTRAVITREAL | Status: AC | PRN
  Administered 2021-11-30: 1.25 mg via INTRAVITREAL

## 2021-12-06 NOTE — Progress Notes (Signed)
[name]  [phone] [phone]     ID: GHAZAL PEVEY DOB: Nov 07, 1946  MR#: [id number]  ZHY#:[id number]  Patient Care Team: Josetta Huddle, MD as PCP - General (Internal Medicine) Jerline Pain, MD as PCP - Cardiology (Cardiology) Juanita Craver, MD as Consulting Physician (Gastroenterology) Ezra Marquess, Virgie Dad, MD as Consulting Physician (Oncology) Excell Seltzer, MD (Inactive) as Consulting Physician (General Surgery) Gery Pray, MD as Consulting Physician (Radiation Oncology) Delice Bison, Charlestine Massed, NP as Nurse Practitioner (Hematology and Oncology) Kathrynn Ducking, MD as Consulting Physician (Neurology) Star Age, MD as Attending Physician (Neurology) Brand Males, MD as Consulting Physician (Pulmonary Disease) OTHER MD:  CHIEF COMPLAINT: Estrogen receptor positive breast cancer; status post bilateral mastectomies; MGUS  CURRENT TREATMENT: Anastrozole   INTERVAL HISTORY: [name] returns today for follow-up of her estrogen receptor positive breast cancer and MGUS  She continues on anastrozole with good tolerance.  She has mild hot flashes which she considers "normal".  Arthralgias and myalgias are not an issue.  Bone density 2020 ordered but not done.    We are following her M-spike:  Latest Reference Range & Units 03/03/19 15:42 10/17/19 12:11 05/27/20 08:35 10/21/20 14:50 07/12/21 09:21 11/21/21 14:17  M Protein SerPl Elph-Mcnc Not Observed g/dL 0.3 (H) 0.2 (H) (C) 0.3 (H) (C) 0.3 (H) (C) 0.3 (H) (C) 0.2 (H) (C)   Kappa lambda ratio is normal  REVIEW OF SYSTEMS: Kimberly Waters tells me she is not doing "anything".  For exercise she and her husband of 65 years go to the grocery store and take a long time there she says.  There had not been any falls.  She does use a walker at all times.  She is not interested in prostheses or bras and is "happy with things the way they are".  A detailed review of systems was otherwise stable.  COVID  19 VACCINATION STATUS: Status post Pfizer x2 with booster 10/21/2020    BREAST CANCER HISTORY: From the original intake note:  Kimberly Waters had an unremarkable mammogram 07/01/2012, after which she did not obtain further mammography. Sometime late 2017 she noted the right nipple was inverted. She brought this to medical attention and on 01/25/2017 she underwent bilateral mammography with tomography and right breast ultrasonography at [facility]. The breast density was category C. In the central right breast there was an area of architectural distortion associated with nipple retraction. Ultrasound confirmed a 2.2 cm irregular mass in the right breast central to the nipple. In addition there was a 2.5 cm irregular mass in the right breast superiorly. The right axilla was sonographically benign.  On 01/29/2017 she underwent biopsy of both these masses, and both showed morphologically similar invasive ductal carcinomas, grade 1 or 2, with evidence of lymphovascular invasion. Both tumors were 100% estrogen receptor positive with strong staining intensity, and 60-100% progesterone receptor positive, with strong staining intensity. The MIP-1 varied from 5-10%. Both tumors were HER-2 negative, with a signals ratio 1.12-1.39, and the number per cell 2.30-3.97.  The patient's subsequent history is as detailed below   PAST MEDICAL HISTORY: Past Medical History:  Diagnosis Date   Allergy    Breast cancer ([place])    Cataract    Colon polyps    Depression    Dyspnea    SEASONAL   Family history of breast cancer    Family history of colon cancer    GERD (gastroesophageal reflux disease)    History of radiation therapy 06/05/17-07/20/17   right chest wall 50.4 Gy  in 28 fractions, right axillary nodal region 45 Gy in 25 fractions   Hyperlipidemia    Hypertension    Hypertensive retinopathy    Hypothyroidism    Macular degeneration    Peripheral neuropathy 06/18/2019   PONV (postoperative nausea and vomiting)      PAST SURGICAL HISTORY: Past Surgical History:  Procedure Laterality Date   ABDOMINAL HYSTERECTOMY     KNEE SURGERY Right    MASTECTOMY W/ SENTINEL NODE BIOPSY Right 03/05/2017   Procedure: BILATERAL TOTAL MASTECTOMIES WITH RIGHT SENTINEL LYMPH NODE BIOPSIES;  Surgeon: Excell Seltzer, MD;  Location: [facility];  Service: General;  Laterality: Right;   SHOULDER SURGERY     BILAT    FAMILY HISTORY Family History  Problem Relation Age of Onset   Heart disease Mother 75       stents   Colon cancer Maternal Grandfather 59   Colon cancer Maternal Aunt 65   Colon cancer Maternal Uncle        dx in his 59s   Breast cancer Paternal Aunt 62   Breast cancer Cousin 81       paternal first cousin   Lung cancer Paternal Grandfather    Colon cancer Maternal Uncle    Head & neck cancer Maternal Uncle        oral cancer  The patient's father died at age 20. Her mother died at age 11 from "old age." The patient had no brothers, 1 sister. On the mother's side a grandfather, and an uncle all had colon cancer's. On the father's side there is an aunt and a cousin with breast cancer, both diagnosed in their late 23s. There is no history of ovarian cancer in the family.   GYNECOLOGIC HISTORY:  No LMP recorded. Patient has had a hysterectomy.  menarche age 69, first live birth age 71, the patient is [place] P2. She underwent total abdominal hysterectomy with bilateral salpingo-oophorectomy remotely and took hormone replacement for approximately 6 years.    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 Her husband Kimberly Waters ("[address]") [place] is retired from [employer]. Their son Kimberly Waters is an Chief Financial Officer living in [place]. Their daughter Kimberly Waters works at [employer] in [place]. The patient has 5 grandchildren. She is a Tourist information centre manager     ADVANCED DIRECTIVES: In the absence of any documentation to the contrary,  the patient's spouse is their HCPOA.    HEALTH MAINTENANCE: Social History   Tobacco Use   Smoking status: Former    Packs/day: 1.00    Years: 45.00    Pack years: 45.00    Types: Cigarettes    Quit date: 02/22/2017    Years since quitting: 4.7   Smokeless tobacco: Never  Vaping Use   Vaping Use: Never used  Substance Use Topics   Alcohol use: No    Alcohol/week: 0.0 standard drinks   Drug use: No    Colonoscopy: 03/18/2014/[facility]  PAP: Status post hysterectomy  Bone density: Never    Allergies  Allergen Reactions   Latex Other (See Comments)    Other reaction(s): Other (See Comments) Tears skin.   Adhesive [Tape] Other (See Comments)    (skin tears)  Tolerates PAPER TAPE   Codeine Nausea Only   Other Nausea Only    Anesthesia--nausea     Current Outpatient Medications  Medication Sig Dispense Refill   albuterol (PROVENTIL,VENTOLIN) 90 MCG/ACT inhaler Inhale 2 puffs into the  lungs every 4 (four) hours as needed. 17 g 3   albuterol (VENTOLIN HFA) 108 (90 Base) MCG/ACT inhaler Inhale 2 puffs into the lungs every 4 (four) hours as needed.     anastrozole (ARIMIDEX) 1 MG tablet Take 1 tablet (1 mg total) by mouth daily. Stop May 2023 90 tablet 4   arformoterol (BROVANA) 15 MCG/2ML NEBU Take 2 mLs (15 mcg total) by nebulization 2 (two) times daily. 120 mL 6   aspirin EC 81 MG EC tablet Take 1 tablet (81 mg total) by mouth daily.     betamethasone dipropionate 7.29 % cream 1 application     budesonide-formoterol (SYMBICORT) 80-4.5 MCG/ACT inhaler      buPROPion (WELLBUTRIN SR) 150 MG 12 hr tablet Take 150 mg by mouth daily.      clobetasol ointment (TEMOVATE) 0.05 % as needed.     diphenhydrAMINE (BENADRYL) 25 MG tablet Take 25 mg by mouth as needed for sleep.     escitalopram (LEXAPRO) 10 MG tablet Take 1 tablet (10 mg total) by mouth daily. 30 tablet 0   ezetimibe (ZETIA) 10 MG tablet TAKE 1 TABLET BY MOUTH EVERY DAY 90 tablet 3   furosemide (LASIX) 40 MG tablet Take 1  tablet (40 mg total) by mouth daily. 30 tablet 0   hydrOXYzine (ATARAX/VISTARIL) 25 MG tablet Take 25 mg by mouth at bedtime.     imipramine (TOFRANIL) 50 MG tablet Take 50 mg by mouth at bedtime.     ipratropium (ATROVENT) 0.06 % nasal spray Place 2 sprays into the nose 4 (four) times daily. 15 mL 3   ketoconazole (NIZORAL) 2 % cream Apply 1 application topically daily. 30 g 6   levocetirizine (XYZAL) 5 MG tablet Take 5 mg by mouth as needed for allergies.     levothyroxine (SYNTHROID, LEVOTHROID) 112 MCG tablet TAKE ONE TABLET BY MOUTH ONCE DAILY 30 tablet 0   meloxicam (MOBIC) 7.5 MG tablet Take 7.5 mg by mouth at bedtime.     mometasone (ELOCON) 0.1 % ointment Apply topically as needed.     nitroGLYCERIN (NITROSTAT) 0.4 MG SL tablet as needed.     omeprazole (PRILOSEC) 20 MG capsule Take 20 mg by mouth daily.     Potassium Chloride ER 20 MEQ TBCR 1 tablet with food     potassium chloride SA (K-DUR,KLOR-CON) 20 MEQ tablet Take 1 tablet (20 mEq total) by mouth daily. 30 tablet 0   revefenacin (YUPELRI) 175 MCG/3ML nebulizer solution Take 3 mLs (175 mcg total) by nebulization daily. 180 mL 5   revefenacin (YUPELRI) 175 MCG/3ML nebulizer solution Take 3 mLs (175 mcg total) by nebulization daily. 90 mL 5   rosuvastatin (CRESTOR) 20 MG tablet TAKE 1 TABLET BY MOUTH EVERY DAY 90 tablet 3   simvastatin (ZOCOR) 20 MG tablet Take 1 tablet by mouth every evening.     tacrolimus (PROTOPIC) 0.1 % ointment Apply topically as needed.     Tiotropium Bromide-Olodaterol (STIOLTO RESPIMAT) 2.5-2.5 MCG/ACT AERS Inhale 2 puffs into the lungs daily. 1 each 5   Tiotropium Bromide-Olodaterol (STIOLTO RESPIMAT) 2.5-2.5 MCG/ACT AERS Inhale 2 puffs into the lungs daily. 4 g 0   No current facility-administered medications for this visit.    OBJECTIVE:  white woman using a Rollator  Vitals:   12/07/21 1053  BP: (!) 144/70  Pulse: 90  Resp: 18  Temp: (!) 97.5 F (36.4 C)  SpO2: 97%     Body mass index is  34.94 kg/m.  ECOG FS:1 - Symptomatic but completely ambulatory   Sclerae unicteric, EOMs intact Wearing a mask No cervical or supraclavicular adenopathy Lungs no rales or rhonchi Heart regular rate and rhythm Abd soft, obese, nontender, positive bowel sounds MSK kyphosis but no focal spinal tenderness, no upper extremity lymphedema Neuro:  nonfocal, well oriented, appropriate affect Breasts: Status post bilateral mastectomies and status post radiation on the right side.  There are some telangiectasias as previously noted.  There is no evidence of local recurrence.  Both axillae are benign.   LAB RESULTS:  CMP     Component Value Date/Time   NA 140 11/21/2021 1417   NA 141 05/12/2020 0938   NA 137 10/16/2017 1059   K 3.5 11/21/2021 1417   K 4.1 10/16/2017 1059   CL 103 11/21/2021 1417   CO2 27 11/21/2021 1417   CO2 25 10/16/2017 1059   GLUCOSE 115 (H) 11/21/2021 1417   GLUCOSE 113 10/16/2017 1059   BUN 17 11/21/2021 1417   BUN 11 05/12/2020 0938   BUN 20.3 10/16/2017 1059   CREATININE 1.50 (H) 11/21/2021 1417   CREATININE 1.23 (H) 07/17/2019 1417   CREATININE 1.2 (H) 10/16/2017 1059   CALCIUM 9.3 11/21/2021 1417   CALCIUM 9.3 10/16/2017 1059   PROT 7.7 11/21/2021 1417   PROT 7.2 03/03/2019 1542   PROT 7.2 10/16/2017 1059   ALBUMIN 4.0 11/21/2021 1417   ALBUMIN 4.7 03/03/2019 1542   ALBUMIN 3.5 10/16/2017 1059   AST 23 11/21/2021 1417   AST 16 07/17/2019 1417   AST 17 10/16/2017 1059   ALT 30 11/21/2021 1417   ALT 23 07/17/2019 1417   ALT 25 10/16/2017 1059   ALKPHOS 69 11/21/2021 1417   ALKPHOS 48 10/16/2017 1059   BILITOT 0.5 11/21/2021 1417   BILITOT 0.4 07/17/2019 1417   BILITOT 0.40 10/16/2017 1059   GFRNONAA 37 (L) 11/21/2021 1417   GFRNONAA 44 (L) 07/17/2019 1417   GFRAA 46 (L) 05/27/2020 0834   GFRAA 51 (L) 07/17/2019 1417    INo results found for: SPEP, UPEP  Lab Results  Component Value Date   WBC 9.0 11/21/2021   NEUTROABS 6.6 11/21/2021    HGB 13.1 11/21/2021   HCT 40.8 11/21/2021   MCV 89.3 11/21/2021   PLT 289 11/21/2021      Chemistry      Component Value Date/Time   NA 140 11/21/2021 1417   NA 141 05/12/2020 0938   NA 137 10/16/2017 1059   K 3.5 11/21/2021 1417   K 4.1 10/16/2017 1059   CL 103 11/21/2021 1417   CO2 27 11/21/2021 1417   CO2 25 10/16/2017 1059   BUN 17 11/21/2021 1417   BUN 11 05/12/2020 0938   BUN 20.3 10/16/2017 1059   CREATININE 1.50 (H) 11/21/2021 1417   CREATININE 1.23 (H) 07/17/2019 1417   CREATININE 1.2 (H) 10/16/2017 1059      Component Value Date/Time   CALCIUM 9.3 11/21/2021 1417   CALCIUM 9.3 10/16/2017 1059   ALKPHOS 69 11/21/2021 1417   ALKPHOS 48 10/16/2017 1059   AST 23 11/21/2021 1417   AST 16 07/17/2019 1417   AST 17 10/16/2017 1059   ALT 30 11/21/2021 1417   ALT 23 07/17/2019 1417   ALT 25 10/16/2017 1059   BILITOT 0.5 11/21/2021 1417   BILITOT 0.4 07/17/2019 1417   BILITOT 0.40 10/16/2017 1059       No results found for: LABCA2  No components found for: [id number]  No results for  input(s): INR in the last 168 hours.  Urinalysis No results found for: COLORURINE, APPEARANCEUR, LABSPEC, PHURINE, GLUCOSEU, HGBUR, BILIRUBINUR, KETONESUR, PROTEINUR, UROBILINOGEN, NITRITE, LEUKOCYTESUR   STUDIES: Intravitreal Injection, Pharmacologic Agent - OD - Right Eye  Result Date: 11/30/2021 Time Out 11/29/2021. 3:13 PM. Confirmed correct patient, procedure, site, and patient consented. Anesthesia Topical anesthesia was used. Anesthetic medications included Lidocaine 2%, Proparacaine 0.5%. Procedure Preparation included 5% betadine to ocular surface, eyelid speculum. A supplied needle was used. Injection: 1.25 mg Bevacizumab 1.16m/0.05ml   Route: Intravitreal, Site: Right Eye   NDC:: 62831-517-61 Lot: [id number] , Expiration date: 01/03/2022, Waste: 0 mL Post-op Post injection exam found visual acuity of at least counting fingers. The patient tolerated the procedure well. There  were no complications. The patient received written and verbal post procedure care education. Post injection medications were not given.   OCT, Retina - OU - Both Eyes  Result Date: 11/30/2021 Right Eye Quality was good. Central Foveal Thickness: 339. Progression has worsened. Findings include abnormal foveal contour, subretinal hyper-reflective material, pigment epithelial detachment, choroidal neovascular membrane, no IRF, no SRF (Interval re-development of SRF overlying PED/SRHM -- very shallow). Left Eye Quality was good. Central Foveal Thickness: 299. Progression has been stable. Findings include abnormal foveal contour, no IRF, no SRF, subretinal hyper-reflective material, outer retinal atrophy, lamellar hole (large central disciform scar with ORA, Foveal notch, stable trace cystic changes and temporal edema). Notes *Images captured and stored on drive Diagnosis / Impression: exu ARMD OU OD: Interval re-development of SRF overlying PED/SRHM -- very shallow OS: large central disciform scar with ORA, Foveal notch, stable trace cystic changes and temporal edema Clinical management: See below Abbreviations: NFP - Normal foveal profile. CME - cystoid macular edema. PED - pigment epithelial detachment. IRF - intraretinal fluid. SRF - subretinal fluid. EZ - ellipsoid zone. ERM - epiretinal membrane. ORA - outer retinal atrophy. ORT - outer retinal tubulation. SRHM - subretinal hyper-reflective material. IRHM - intraretinal hyper-reflective material     ELIGIBLE FOR AVAILABLE RESEARCH PROTOCOL: no  ASSESSMENT: 75y.o.[place], [place]  Woman status post biopsy of 2 separate masses in the central right breast 01/29/2017, showing a clinical mT2 N0 invasive ductal carcinoma, stage IB in the new classification: Both masses being estrogen receptor and progesterone receptor positive, HER-2 not amplified, with an MIB-1 between 5 and 10%.  (1) status post bilateral mastectomies 03/05/2017 showing  (a) on the  left, no malignancy  (b) On the right, a pT3 pN0, stage IIA invasive ductal carcinoma, grade 2,  with negative margins    (c) patient opted against reconstruction  (2) Oncotype score of 6 predicts a 10 year risk of recurrence outside the breast of 5% if the patient's only systemic therapy is tamoxifen for 5 years. It also predicts no benefit from chemotherapy.  (3) adjuvant radiation 06/05/17 - 07/20/17  Right Chest Wall/ 50.4 Gy in 28 fractions Right axillary Nodal region// 45 Gy in 25 fractions Mastectomy scar boost 10 gray in 5 fractions   (4) started anastrozole May 2018--held 07/26/2019 for evaluation of neuropathy  ([facility] showed a T score of -0.3  (b) repeat bone density October 2020 not done  (c) anastrozole resumed October 2020  (5) genetics testing through the Hereditary Gene Panel offered by [facility] found no deleterious mutations in APC, ATM, AXIN2, BARD1, BMPR1A, BRCA1, BRCA2, BRIP1, CDH1, CDKN2A (p14ARF), CDKN2A (p16INK4a), CHEK2, DICER1, EPCAM (Deletion/duplication testing only), GREM1 (promoter region deletion/duplication testing only), KIT, MEN1, MLH1, MSH2, MSH6, MUTYH,  NBN, NF1, PALB2, PDGFRA, PMS2, POLD1, POLE, PTEN, RAD50, RAD51C, RAD51D, SDHB, SDHC, SDHD, SMAD4, SMARCA4. STK11, TP53, TSC1, TSC2, and VHL.  The following gene was evaluated for sequence changes only: SDHA and HOXB13 c.251G>A variant only  (a) 2 VUS were identified:  MSH6 c.3394G>C and TSC1 c.1481C>T  (6) tobacco abuse: Patient quit smoking 03/03/2017  (7) M-GUS: Multiple myeloma panel obtained 03/03/2019 shows an M protein of 0.3, IgG lambda  (a) normal IgG, IgA, and IgM levels and normal kappa/lambda ratio 10/17/2019  (b) following M-spike -- most recent reading stable (NOV 2022)  (c) kappa/ lambda ratio WNL, with minimal elevation of kappa light chains  (8) neuropathy:  (a) labs 03/03/2019 show a hemoglobin A1c of 6.3 (prediabetes).  (b) brain MRI 07/07/2019 and lumbar MRI  08/04/2019 nondiagnostic   PLAN: Belva is 4-1/2 years out from definitive surgery for her breast cancer with no evidence of disease recurrence.  This is very febrile.  She is tolerating anastrozole well and the plan is to continue that a total of 5 years.  We are also following her monoclonal gammopathy, MGUS.  There has been absolutely no change.  At this point I feel comfortable with her being followed yearly and she will see [place] again in October 2023  She knows to call for any other issue that may develop before that visit.  Total encounter time 20 minutes.Sarajane Jews C. Terryann Verbeek, MD  12/07/21 11:19 AM Medical Oncology and Hematology [facility] [place],  [address] Tel. [phone]    Fax. [phone]   I, Wilburn Mylar, am acting as scribe for Dr. Virgie Dad. Vontrell Pullman.  I, Lurline Del MD, have reviewed the above documentation for accuracy and completeness, and I agree with the above.   *Total Encounter Time as defined by the Centers for Medicare and Medicaid Services includes, in addition to the face-to-face time of a patient visit (documented in the note above) non-face-to-face time: obtaining and reviewing outside history, ordering and reviewing medications, tests or procedures, care coordination (communications with other health care professionals or caregivers) and documentation in the medical record.

## 2021-12-07 ENCOUNTER — Inpatient Hospital Stay: Payer: Medicare Other | Attending: Oncology | Admitting: Oncology

## 2021-12-07 ENCOUNTER — Other Ambulatory Visit: Payer: Self-pay

## 2021-12-07 VITALS — BP 144/70 | HR 90 | Temp 97.5°F | Resp 18 | Ht 61.0 in | Wt 184.9 lb

## 2021-12-07 DIAGNOSIS — Z170 Estrogen receptor positive status [ER+]: Secondary | ICD-10-CM | POA: Insufficient documentation

## 2021-12-07 DIAGNOSIS — Z800 Family history of malignant neoplasm of digestive organs: Secondary | ICD-10-CM | POA: Insufficient documentation

## 2021-12-07 DIAGNOSIS — Z90710 Acquired absence of both cervix and uterus: Secondary | ICD-10-CM | POA: Insufficient documentation

## 2021-12-07 DIAGNOSIS — Z87891 Personal history of nicotine dependence: Secondary | ICD-10-CM | POA: Diagnosis not present

## 2021-12-07 DIAGNOSIS — R7303 Prediabetes: Secondary | ICD-10-CM | POA: Diagnosis not present

## 2021-12-07 DIAGNOSIS — C50111 Malignant neoplasm of central portion of right female breast: Secondary | ICD-10-CM | POA: Insufficient documentation

## 2021-12-07 DIAGNOSIS — Z808 Family history of malignant neoplasm of other organs or systems: Secondary | ICD-10-CM | POA: Diagnosis not present

## 2021-12-07 DIAGNOSIS — Z803 Family history of malignant neoplasm of breast: Secondary | ICD-10-CM | POA: Insufficient documentation

## 2021-12-07 DIAGNOSIS — D472 Monoclonal gammopathy: Secondary | ICD-10-CM | POA: Insufficient documentation

## 2021-12-07 DIAGNOSIS — Z801 Family history of malignant neoplasm of trachea, bronchus and lung: Secondary | ICD-10-CM | POA: Insufficient documentation

## 2021-12-07 DIAGNOSIS — G629 Polyneuropathy, unspecified: Secondary | ICD-10-CM | POA: Insufficient documentation

## 2021-12-07 DIAGNOSIS — I10 Essential (primary) hypertension: Secondary | ICD-10-CM | POA: Diagnosis not present

## 2021-12-07 DIAGNOSIS — C50411 Malignant neoplasm of upper-outer quadrant of right female breast: Secondary | ICD-10-CM

## 2021-12-07 MED ORDER — ANASTROZOLE 1 MG PO TABS: 1.0000 mg | ORAL_TABLET | Freq: Every day | ORAL | 4 refills | Status: DC

## 2021-12-29 NOTE — Progress Notes (Signed)
[facility] Note  01/03/2022     CHIEF COMPLAINT Patient presents for Retina Follow Up  HISTORY OF PRESENT ILLNESS: Kimberly Waters is a 76 y.o. female who presents to the clinic today for:  HPI     Retina Follow Up   Patient presents with  Wet AMD.  In right eye.  This started 5 weeks ago.  I, the attending physician,  performed the HPI with the patient and updated documentation appropriately.        Comments   Patient here for 5 weeks retina follow up for exu ARMD OD. Patient states vision dong ok. No eye pain.       Last edited by Bernarda Caffey, MD on 01/03/2022  4:38 PM.      Referring physician: Josetta Huddle, MD [address] [place],  [place] [address]  HISTORICAL INFORMATION:   Selected notes from the MEDICAL RECORD NUMBER Referred by Dr. Marin Comment for eval of decreased VA OD.   CURRENT MEDICATIONS: No current outpatient medications on file. (Ophthalmic Drugs)   No current facility-administered medications for this visit. (Ophthalmic Drugs)   Current Outpatient Medications (Other)  Medication Sig   albuterol (PROVENTIL,VENTOLIN) 90 MCG/ACT inhaler Inhale 2 puffs into the lungs every 4 (four) hours as needed.   albuterol (VENTOLIN HFA) 108 (90 Base) MCG/ACT inhaler Inhale 2 puffs into the lungs every 4 (four) hours as needed.   anastrozole (ARIMIDEX) 1 MG tablet Take 1 tablet (1 mg total) by mouth daily. Stop May 2023   arformoterol (BROVANA) 15 MCG/2ML NEBU Take 2 mLs (15 mcg total) by nebulization 2 (two) times daily.   aspirin EC 81 MG EC tablet Take 1 tablet (81 mg total) by mouth daily.   betamethasone dipropionate 7.89 % cream 1 application   budesonide-formoterol (SYMBICORT) 80-4.5 MCG/ACT inhaler    buPROPion (WELLBUTRIN SR) 150 MG 12 hr tablet Take 150 mg by mouth daily.    clobetasol ointment (TEMOVATE) 0.05 % as needed.   diphenhydrAMINE (BENADRYL) 25 MG tablet Take 25 mg by mouth as needed for sleep.   escitalopram  (LEXAPRO) 10 MG tablet Take 1 tablet (10 mg total) by mouth daily.   ezetimibe (ZETIA) 10 MG tablet TAKE 1 TABLET BY MOUTH EVERY DAY   furosemide (LASIX) 40 MG tablet Take 1 tablet (40 mg total) by mouth daily.   hydrOXYzine (ATARAX/VISTARIL) 25 MG tablet Take 25 mg by mouth at bedtime.   imipramine (TOFRANIL) 50 MG tablet Take 50 mg by mouth at bedtime.   ipratropium (ATROVENT) 0.06 % nasal spray Place 2 sprays into the nose 4 (four) times daily.   ketoconazole (NIZORAL) 2 % cream Apply 1 application topically daily.   levocetirizine (XYZAL) 5 MG tablet Take 5 mg by mouth as needed for allergies.   levothyroxine (SYNTHROID, LEVOTHROID) 112 MCG tablet TAKE ONE TABLET BY MOUTH ONCE DAILY   meloxicam (MOBIC) 7.5 MG tablet Take 7.5 mg by mouth at bedtime.   mometasone (ELOCON) 0.1 % ointment Apply topically as needed.   nitroGLYCERIN (NITROSTAT) 0.4 MG SL tablet as needed.   omeprazole (PRILOSEC) 20 MG capsule Take 20 mg by mouth daily.   Potassium Chloride ER 20 MEQ TBCR 1 tablet with food   potassium chloride SA (K-DUR,KLOR-CON) 20 MEQ tablet Take 1 tablet (20 mEq total) by mouth daily.   revefenacin (YUPELRI) 175 MCG/3ML nebulizer solution Take 3 mLs (175 mcg total) by nebulization daily.   revefenacin (YUPELRI) 175 MCG/3ML nebulizer  solution Take 3 mLs (175 mcg total) by nebulization daily.   rosuvastatin (CRESTOR) 20 MG tablet TAKE 1 TABLET BY MOUTH EVERY DAY   simvastatin (ZOCOR) 20 MG tablet Take 1 tablet by mouth every evening.   tacrolimus (PROTOPIC) 0.1 % ointment Apply topically as needed.   Tiotropium Bromide-Olodaterol (STIOLTO RESPIMAT) 2.5-2.5 MCG/ACT AERS Inhale 2 puffs into the lungs daily.   Tiotropium Bromide-Olodaterol (STIOLTO RESPIMAT) 2.5-2.5 MCG/ACT AERS Inhale 2 puffs into the lungs daily.   No current facility-administered medications for this visit. (Other)   REVIEW OF SYSTEMS: ROS   Positive for: Neurological, Eyes, Respiratory Negative for: Constitutional,  Gastrointestinal, Skin, Genitourinary, Musculoskeletal, HENT, Endocrine, Cardiovascular, Psychiatric, Allergic/Imm, Heme/Lymph Last edited by Theodore Demark, COA on 01/03/2022  2:20 PM.     ALLERGIES Allergies  Allergen Reactions   Latex Other (See Comments)    Other reaction(s): Other (See Comments) Tears skin.   Adhesive [Tape] Other (See Comments)    (skin tears)  Tolerates PAPER TAPE   Codeine Nausea Only   Other Nausea Only    Anesthesia--nausea    PAST MEDICAL HISTORY Past Medical History:  Diagnosis Date   Allergy    Breast cancer ([place])    Cataract    Colon polyps    Depression    Dyspnea    SEASONAL   Family history of breast cancer    Family history of colon cancer    GERD (gastroesophageal reflux disease)    History of radiation therapy 06/05/17-07/20/17   right chest wall 50.4 Gy in 28 fractions, right axillary nodal region 45 Gy in 25 fractions   Hyperlipidemia    Hypertension    Hypertensive retinopathy    Hypothyroidism    Macular degeneration    Peripheral neuropathy 06/18/2019   PONV (postoperative nausea and vomiting)    Past Surgical History:  Procedure Laterality Date   ABDOMINAL HYSTERECTOMY     KNEE SURGERY Right    MASTECTOMY W/ SENTINEL NODE BIOPSY Right 03/05/2017   Procedure: BILATERAL TOTAL MASTECTOMIES WITH RIGHT SENTINEL LYMPH NODE BIOPSIES;  Surgeon: Excell Seltzer, MD;  Location: [facility] OR;  Service: General;  Laterality: Right;   SHOULDER SURGERY     BILAT   FAMILY HISTORY Family History  Problem Relation Age of Onset   Heart disease Mother 72       stents   Colon cancer Maternal Grandfather 59   Colon cancer Maternal Aunt 65   Colon cancer Maternal Uncle        dx in his 9s   Breast cancer Paternal Aunt 6   Breast cancer Cousin 5       paternal first cousin   Lung cancer Paternal Grandfather    Colon cancer Maternal Uncle    Head & neck cancer Maternal Uncle        oral cancer   SOCIAL HISTORY Social History    Tobacco Use   Smoking status: Former    Packs/day: 1.00    Years: 45.00    Pack years: 45.00    Types: Cigarettes    Quit date: 02/22/2017    Years since quitting: 4.8   Smokeless tobacco: Never  Vaping Use   Vaping Use: Never used  Substance Use Topics   Alcohol use: No    Alcohol/week: 0.0 standard drinks   Drug use: No       OPHTHALMIC EXAM: Base Eye Exam     Visual Acuity (Snellen - Linear)       Right Left  Dist cc 20/30 -2 CF at 3'   Dist ph cc NI NI    Correction: Glasses         Tonometry (Tonopen, 2:17 PM)       Right Left   Pressure 11 12         Pupils       Dark Light Shape React APD   Right 3 2 Round Brisk None   Left 3 3 Round Minimal +1         Visual Fields (Counting fingers)       Left Right     Full   Restrictions Partial inner superior temporal, inferior temporal, superior nasal, inferior nasal deficiencies          Extraocular Movement       Right Left    Full, Ortho Full, Ortho         Neuro/Psych     Oriented x3: Yes   Mood/Affect: Normal         Dilation     Both eyes: 1.0% Mydriacyl, 2.5% Phenylephrine @ 2:17 PM           Slit Lamp and Fundus Exam     Slit Lamp Exam       Right Left   Lids/Lashes Dermatochalasis - upper lid Dermatochalasis - upper lid   Conjunctiva/Sclera nasal and temporal pinguecula nasal and temporal pinguecula   Cornea 2+ Punctate epithelial erosions, irregular epi, arcus, decreased TBUT 2+ Punctate epithelial erosions, irregular epi, arcus, decreased TBUT   Anterior Chamber deep, narrow temporal angle deep, narrow temporal angle   Iris Round and moderately dilated, mild anterior bowing Round and moderately dilated, mild anterior bowing   Lens 2-3+ Nuclear sclerosis with early brunescence, 2-3+ Cortical cataract 2-3+ Nuclear sclerosis with early brunescence, 2-3+ Cortical cataract   Anterior Vitreous Vitreous syneresis, Posterior vitreous detachment Vitreous syneresis,  Posterior vitreous detachment         Fundus Exam       Right Left   Disc Pink and Sharp, Compact, mild PPA Mild Pallor, Sharp rim, +elevation   C/D Ratio 0.0 0.1   Macula Blunted foveal reflex, +CNV with pigment clumping/ring, heme stably improved, trace shallow SRF improved, RPE mottling and clumping, [place] Blunted foveal reflex, large area of GA with disciform scar and subretinal fibrosis, central pigment clumping, no heme   Vessels attenuated, mild tortuousity attenuated, Tortuous   Periphery Attached, scattered reticular degeneration, paving stone degeneration Attached, scattered reticular degeneration, scattered paving stone degeneration           Refraction     Wearing Rx       Sphere Cylinder Axis   Right -6.50 +2.00 152   Left -5.25 +1.00 018    Type: SVL           IMAGING AND PROCEDURES  Imaging and Procedures for 01/03/2022  OCT, Retina - OU - Both Eyes       Right Eye Quality was good. Central Foveal Thickness: 334. Progression has improved. Findings include abnormal foveal contour, subretinal hyper-reflective material, pigment epithelial detachment, choroidal neovascular membrane, no IRF, no SRF (Interval improvement in SRF overlying PED/SRHM).   Left Eye Quality was good. Central Foveal Thickness: 285. Progression has been stable. Findings include abnormal foveal contour, no IRF, no SRF, subretinal hyper-reflective material, outer retinal atrophy, lamellar hole (large central disciform scar with ORA, Foveal notch, trace cystic changes and temporal edema -- all stable).   Notes *Images captured and stored on drive  Diagnosis / Impression:  exu ARMD OU OD: Interval improvement in SRF overlying PED/SRHM OS: large central disciform scar with ORA, Foveal notch, trace cystic changes and temporal edema -- all stable  Clinical management:  See below  Abbreviations: NFP - Normal foveal profile. CME - cystoid macular edema. PED - pigment epithelial  detachment. IRF - intraretinal fluid. SRF - subretinal fluid. EZ - ellipsoid zone. ERM - epiretinal membrane. ORA - outer retinal atrophy. ORT - outer retinal tubulation. SRHM - subretinal hyper-reflective material. IRHM - intraretinal hyper-reflective material      Intravitreal Injection, Pharmacologic Agent - OD - Right Eye       Time Out 01/03/2022. 3:51 PM. Confirmed correct patient, procedure, site, and patient consented.   Anesthesia Topical anesthesia was used. Anesthetic medications included Lidocaine 2%, Proparacaine 0.5%.   Procedure Preparation included 5% betadine to ocular surface, eyelid speculum. A supplied needle was used.   Injection: 1.25 mg Bevacizumab 1.25mg /0.73ml   Route: Intravitreal, Site: Right Eye   NDC: H061816, Lot: [id number], Expiration date: 02/02/2022, Waste: 0 mL   Post-op Post injection exam found visual acuity of at least counting fingers. The patient tolerated the procedure well. There were no complications. The patient received written and verbal post procedure care education. Post injection medications were not given.            ASSESSMENT/PLAN:   ICD-10-CM   1. Exudative age-related macular degeneration of right eye with active choroidal neovascularization (HCC)  H35.3211 OCT, Retina - OU - Both Eyes    Intravitreal Injection, Pharmacologic Agent - OD - Right Eye    Bevacizumab (AVASTIN) SOLN 1.25 mg    2. Exudative age-related macular degeneration of left eye with active choroidal neovascularization ([place])  H35.3221     3. Essential hypertension  I10     4. Hypertensive retinopathy of both eyes  H35.033     5. Combined forms of age-related cataract of both eyes  H25.813      1. Exudative age related macular degeneration OD  - pt initially presented w/ 2 wk history of decreased vision OD  - history of intravitreal injections OS with Dr. Zadie Rhine in 2017 and earlier -- pt reports stopping visits after the development of a corneal  abrasion from a lid speculum.  - s/p IVA OD #1 (12.21.21), #2 (01.24.22), #3 (2.21.22), #4 (03.22.22), #5 (4.26.22), #6 (5.31.22), #7 (7.12.22), #8 (8.16.22), #9 (9.20.22), #10 (10.25.22), #11 (12.6.22)  **history of increased SRF at 6 wk interval, noted on 12.6.22**  - OCT shows OD: Interval improvement in SRF overlying PED/SRHM at 5 wks  - BCVA OD 20/30              - Recommend IVA OD #12 today, 1.10.23 w/ f/u in 5 wks  - RBA of procedure discussed, questions answered  - informed consent obtained and signed  - see procedure note  - f/u in 5 wks -- DFE/OCT, possible injection   2. Exudative age related macular degeneration, OS  - OS with history inactive disciform scar, but new focal IRH noted 5.31.22 -- resolved on exam today - s/p IVA OS #1 (05.31.22), #2 (07.12.22), #3 (8.15.22), #4 (9.20.22) for focal [facility]  - history of intravitreal injections OS with Dr. Zadie Rhine in 2017 and earlier -- pt reports stopping visits after the development of a corneal abrasion from a lid speculum  - has not seen a retina specialist since 2017  - exam and OCT OS shows stable disciform scar -- no IRF/SRF  -  BCVA CF OS - recommend holding IVA OS today - f/u 5 weeks, DFE, OCT  3,4. Hypertensive retinopathy OU - discussed importance of tight BP control - monitor  5. Mixed Cataract OU - The symptoms of cataract, surgical options, and treatments and risks were discussed with patient. - discussed diagnosis and progression - approaching visual significance - monitor  Ophthalmic Meds Ordered this visit:  Meds ordered this encounter  Medications   Bevacizumab (AVASTIN) SOLN 1.25 mg      Return in about 5 weeks (around 02/07/2022) for 5 wk f/u for exu ARMD OD w/DFE/OCT/likely IVA OD.  There are no Patient Instructions on file for this visit.  This document serves as a record of services personally performed by Gardiner Sleeper, MD, PhD. It was created on their behalf by Estill Bakes, COT an ophthalmic  technician. The creation of this record is the provider's dictation and/or activities during the visit.    Electronically signed by: Estill Bakes, COT 1.5.23 @ 4:57 PM   This document serves as a record of services personally performed by Gardiner Sleeper, MD, PhD. It was created on their behalf by Estill Bakes, COT an ophthalmic technician. The creation of this record is the provider's dictation and/or activities during the visit.    Electronically signed by: Estill Bakes, COT 1.10.23 @ 4:57 PM   Gardiner Sleeper, M.D., Ph.D. Diseases & Surgery of the Retina and Vitreous [facility] 1.10.23  I have reviewed the above documentation for accuracy and completeness, and I agree with the above. Gardiner Sleeper, M.D., Ph.D. 01/03/22 4:57 PM  Abbreviations: M myopia (nearsighted); A astigmatism; H hyperopia (farsighted); P presbyopia; Mrx spectacle prescription;  CTL contact lenses; OD right eye; OS left eye; OU both eyes  XT exotropia; ET esotropia; PEK punctate epithelial keratitis; PEE punctate epithelial erosions; DES dry eye syndrome; MGD meibomian gland dysfunction; ATs artificial tears; PFAT's preservative free artificial tears; [place] nuclear sclerotic cataract; PSC posterior subcapsular cataract; ERM epi-retinal membrane; PVD posterior vitreous detachment; RD retinal detachment; DM diabetes mellitus; DR diabetic retinopathy; NPDR non-proliferative diabetic retinopathy; PDR proliferative diabetic retinopathy; CSME clinically significant macular edema; DME diabetic macular edema; dbh dot blot hemorrhages; CWS cotton wool spot; POAG primary open angle glaucoma; C/D cup-to-disc ratio; HVF humphrey visual field; GVF goldmann visual field; OCT optical coherence tomography; IOP intraocular pressure; BRVO Branch retinal vein occlusion; CRVO central retinal vein occlusion; CRAO central retinal artery occlusion; BRAO branch retinal artery occlusion; RT retinal tear; SB scleral buckle;  PPV pars plana vitrectomy; VH Vitreous hemorrhage; PRP panretinal laser photocoagulation; IVK intravitreal kenalog; VMT vitreomacular traction; MH Macular hole;  NVD neovascularization of the disc; NVE neovascularization elsewhere; AREDS age related eye disease study; ARMD age related macular degeneration; POAG primary open angle glaucoma; EBMD epithelial/anterior basement membrane dystrophy; ACIOL anterior chamber intraocular lens; IOL intraocular lens; PCIOL posterior chamber intraocular lens; Phaco/IOL phacoemulsification with intraocular lens placement; [place] photorefractive keratectomy; LASIK laser assisted in situ keratomileusis; HTN hypertension; DM diabetes mellitus; COPD chronic obstructive pulmonary disease

## 2022-01-03 ENCOUNTER — Ambulatory Visit (INDEPENDENT_AMBULATORY_CARE_PROVIDER_SITE_OTHER): Payer: Medicare Other | Admitting: Ophthalmology

## 2022-01-03 ENCOUNTER — Encounter (INDEPENDENT_AMBULATORY_CARE_PROVIDER_SITE_OTHER): Payer: Self-pay | Admitting: Ophthalmology

## 2022-01-03 ENCOUNTER — Other Ambulatory Visit: Payer: Self-pay

## 2022-01-03 DIAGNOSIS — H25813 Combined forms of age-related cataract, bilateral: Secondary | ICD-10-CM

## 2022-01-03 DIAGNOSIS — H353221 Exudative age-related macular degeneration, left eye, with active choroidal neovascularization: Secondary | ICD-10-CM

## 2022-01-03 DIAGNOSIS — I10 Essential (primary) hypertension: Secondary | ICD-10-CM | POA: Diagnosis not present

## 2022-01-03 DIAGNOSIS — H353231 Exudative age-related macular degeneration, bilateral, with active choroidal neovascularization: Secondary | ICD-10-CM | POA: Diagnosis not present

## 2022-01-03 DIAGNOSIS — H353211 Exudative age-related macular degeneration, right eye, with active choroidal neovascularization: Secondary | ICD-10-CM

## 2022-01-03 DIAGNOSIS — H35033 Hypertensive retinopathy, bilateral: Secondary | ICD-10-CM

## 2022-01-03 MED ORDER — BEVACIZUMAB CHEMO INJECTION 1.25MG/0.05ML SYRINGE FOR KALEIDOSCOPE
1.2500 mg | INTRAVITREAL | Status: AC | PRN
  Administered 2022-01-03: 1.25 mg via INTRAVITREAL

## 2022-01-04 DIAGNOSIS — F322 Major depressive disorder, single episode, severe without psychotic features: Secondary | ICD-10-CM | POA: Diagnosis not present

## 2022-01-04 DIAGNOSIS — I509 Heart failure, unspecified: Secondary | ICD-10-CM | POA: Diagnosis not present

## 2022-01-04 DIAGNOSIS — I700 Atherosclerosis of aorta: Secondary | ICD-10-CM | POA: Diagnosis not present

## 2022-01-04 DIAGNOSIS — H9190 Unspecified hearing loss, unspecified ear: Secondary | ICD-10-CM | POA: Diagnosis not present

## 2022-01-04 DIAGNOSIS — J439 Emphysema, unspecified: Secondary | ICD-10-CM | POA: Diagnosis not present

## 2022-01-04 DIAGNOSIS — I10 Essential (primary) hypertension: Secondary | ICD-10-CM | POA: Diagnosis not present

## 2022-01-04 DIAGNOSIS — E039 Hypothyroidism, unspecified: Secondary | ICD-10-CM | POA: Diagnosis not present

## 2022-01-04 DIAGNOSIS — K219 Gastro-esophageal reflux disease without esophagitis: Secondary | ICD-10-CM | POA: Diagnosis not present

## 2022-01-04 DIAGNOSIS — M170 Bilateral primary osteoarthritis of knee: Secondary | ICD-10-CM | POA: Diagnosis not present

## 2022-01-04 DIAGNOSIS — H3530 Unspecified macular degeneration: Secondary | ICD-10-CM | POA: Diagnosis not present

## 2022-01-05 DIAGNOSIS — E039 Hypothyroidism, unspecified: Secondary | ICD-10-CM | POA: Diagnosis not present

## 2022-02-02 NOTE — Progress Notes (Signed)
[facility] Note  02/07/2022     CHIEF COMPLAINT Patient presents for Retina Follow Up  HISTORY OF PRESENT ILLNESS: Kimberly Waters is a 76 y.o. female who presents to the clinic today for:  HPI     Retina Follow Up   Patient presents with  Wet AMD.  In right eye.  Severity is moderate.  Duration of 4 weeks.  Since onset it is stable.  I, the attending physician,  performed the HPI with the patient and updated documentation appropriately.        Comments   Patient states vision the same OU.      Last edited by Bernarda Caffey, MD on 02/07/2022  2:23 PM.      Referring physician: Josetta Huddle, MD [address] [place],  [place] [address]  HISTORICAL INFORMATION:   Selected notes from the MEDICAL RECORD NUMBER Referred by Dr. Marin Comment for eval of decreased VA OD.   CURRENT MEDICATIONS: No current outpatient medications on file. (Ophthalmic Drugs)   No current facility-administered medications for this visit. (Ophthalmic Drugs)   Current Outpatient Medications (Other)  Medication Sig   albuterol (PROVENTIL,VENTOLIN) 90 MCG/ACT inhaler Inhale 2 puffs into the lungs every 4 (four) hours as needed.   albuterol (VENTOLIN HFA) 108 (90 Base) MCG/ACT inhaler Inhale 2 puffs into the lungs every 4 (four) hours as needed.   anastrozole (ARIMIDEX) 1 MG tablet Take 1 tablet (1 mg total) by mouth daily. Stop May 2023   arformoterol (BROVANA) 15 MCG/2ML NEBU Take 2 mLs (15 mcg total) by nebulization 2 (two) times daily.   aspirin EC 81 MG EC tablet Take 1 tablet (81 mg total) by mouth daily.   betamethasone dipropionate 6.26 % cream 1 application   budesonide-formoterol (SYMBICORT) 80-4.5 MCG/ACT inhaler    buPROPion (WELLBUTRIN SR) 150 MG 12 hr tablet Take 150 mg by mouth daily.    clobetasol ointment (TEMOVATE) 0.05 % as needed.   diphenhydrAMINE (BENADRYL) 25 MG tablet Take 25 mg by mouth as needed for sleep.   escitalopram (LEXAPRO) 10 MG tablet  Take 1 tablet (10 mg total) by mouth daily.   ezetimibe (ZETIA) 10 MG tablet TAKE 1 TABLET BY MOUTH EVERY DAY   furosemide (LASIX) 40 MG tablet Take 1 tablet (40 mg total) by mouth daily.   hydrOXYzine (ATARAX/VISTARIL) 25 MG tablet Take 25 mg by mouth at bedtime.   imipramine (TOFRANIL) 50 MG tablet Take 50 mg by mouth at bedtime.   ipratropium (ATROVENT) 0.06 % nasal spray Place 2 sprays into the nose 4 (four) times daily.   ketoconazole (NIZORAL) 2 % cream Apply 1 application topically daily.   levocetirizine (XYZAL) 5 MG tablet Take 5 mg by mouth as needed for allergies.   levothyroxine (SYNTHROID, LEVOTHROID) 112 MCG tablet TAKE ONE TABLET BY MOUTH ONCE DAILY   meloxicam (MOBIC) 7.5 MG tablet Take 7.5 mg by mouth at bedtime.   mometasone (ELOCON) 0.1 % ointment Apply topically as needed.   nitroGLYCERIN (NITROSTAT) 0.4 MG SL tablet as needed.   omeprazole (PRILOSEC) 20 MG capsule Take 20 mg by mouth daily.   Potassium Chloride ER 20 MEQ TBCR 1 tablet with food   potassium chloride SA (K-DUR,KLOR-CON) 20 MEQ tablet Take 1 tablet (20 mEq total) by mouth daily.   revefenacin (YUPELRI) 175 MCG/3ML nebulizer solution Take 3 mLs (175 mcg total) by nebulization daily.   revefenacin (YUPELRI) 175 MCG/3ML nebulizer solution Take 3 mLs (175 mcg  total) by nebulization daily.   rosuvastatin (CRESTOR) 20 MG tablet TAKE 1 TABLET BY MOUTH EVERY DAY   simvastatin (ZOCOR) 20 MG tablet Take 1 tablet by mouth every evening.   tacrolimus (PROTOPIC) 0.1 % ointment Apply topically as needed.   Tiotropium Bromide-Olodaterol (STIOLTO RESPIMAT) 2.5-2.5 MCG/ACT AERS Inhale 2 puffs into the lungs daily.   Tiotropium Bromide-Olodaterol (STIOLTO RESPIMAT) 2.5-2.5 MCG/ACT AERS Inhale 2 puffs into the lungs daily.   No current facility-administered medications for this visit. (Other)   REVIEW OF SYSTEMS: ROS   Positive for: Neurological, Eyes, Respiratory Negative for: Constitutional, Gastrointestinal, Skin,  Genitourinary, Musculoskeletal, HENT, Endocrine, Cardiovascular, Psychiatric, Allergic/Imm, Heme/Lymph Last edited by Roselee Nova D, COT on 02/07/2022  1:15 PM.      ALLERGIES Allergies  Allergen Reactions   Latex Other (See Comments)    Other reaction(s): Other (See Comments) Tears skin.   Adhesive [Tape] Other (See Comments)    (skin tears)  Tolerates PAPER TAPE   Codeine Nausea Only   Other Nausea Only    Anesthesia--nausea    PAST MEDICAL HISTORY Past Medical History:  Diagnosis Date   Allergy    Breast cancer ([place])    Cataract    Colon polyps    Depression    Dyspnea    SEASONAL   Family history of breast cancer    Family history of colon cancer    GERD (gastroesophageal reflux disease)    History of radiation therapy 06/05/17-07/20/17   right chest wall 50.4 Gy in 28 fractions, right axillary nodal region 45 Gy in 25 fractions   Hyperlipidemia    Hypertension    Hypertensive retinopathy    Hypothyroidism    Macular degeneration    Peripheral neuropathy 06/18/2019   PONV (postoperative nausea and vomiting)    Past Surgical History:  Procedure Laterality Date   ABDOMINAL HYSTERECTOMY     KNEE SURGERY Right    MASTECTOMY W/ SENTINEL NODE BIOPSY Right 03/05/2017   Procedure: BILATERAL TOTAL MASTECTOMIES WITH RIGHT SENTINEL LYMPH NODE BIOPSIES;  Surgeon: Excell Seltzer, MD;  Location: [facility];  Service: General;  Laterality: Right;   SHOULDER SURGERY     BILAT   FAMILY HISTORY Family History  Problem Relation Age of Onset   Heart disease Mother 28       stents   Colon cancer Maternal Grandfather 59   Colon cancer Maternal Aunt 65   Colon cancer Maternal Uncle        dx in his 47s   Breast cancer Paternal Aunt 9   Breast cancer Cousin 83       paternal first cousin   Lung cancer Paternal Grandfather    Colon cancer Maternal Uncle    Head & neck cancer Maternal Uncle        oral cancer   SOCIAL HISTORY Social History   Tobacco Use   Smoking  status: Former    Packs/day: 1.00    Years: 45.00    Pack years: 45.00    Types: Cigarettes    Quit date: 02/22/2017    Years since quitting: 4.9   Smokeless tobacco: Never  Vaping Use   Vaping Use: Never used  Substance Use Topics   Alcohol use: No    Alcohol/week: 0.0 standard drinks   Drug use: No       OPHTHALMIC EXAM: Base Eye Exam     Visual Acuity (Snellen - Linear)       Right Left   Dist cc 20/40 CF  at 2'   Dist ph cc 20/30 -2 NI    Correction: Glasses         Tonometry (Tonopen, 1:22 PM)       Right Left   Pressure 18 17         Visual Fields (Counting fingers)       Left Right    Full Full         Extraocular Movement       Right Left    Full, Ortho Full, Ortho         Neuro/Psych     Oriented x3: Yes   Mood/Affect: Normal         Dilation     Both eyes: 1.0% Mydriacyl, 2.5% Phenylephrine @ 1:22 PM           Slit Lamp and Fundus Exam     Slit Lamp Exam       Right Left   Lids/Lashes Dermatochalasis - upper lid Dermatochalasis - upper lid   Conjunctiva/Sclera nasal and temporal pinguecula nasal and temporal pinguecula   Cornea 2+ Punctate epithelial erosions, irregular epi, arcus, decreased TBUT 2+ Punctate epithelial erosions, irregular epi, arcus, decreased TBUT   Anterior Chamber deep, narrow temporal angle deep, narrow temporal angle   Iris Round and moderately dilated, mild anterior bowing Round and moderately dilated, mild anterior bowing   Lens 2-3+ Nuclear sclerosis with early brunescence, 2-3+ Cortical cataract 2-3+ Nuclear sclerosis with early brunescence, 2-3+ Cortical cataract   Anterior Vitreous Vitreous syneresis, Posterior vitreous detachment Vitreous syneresis, Posterior vitreous detachment         Fundus Exam       Right Left   Disc Pink and Sharp, Compact, mild PPA Mild Pallor, Sharp rim, +elevation   C/D Ratio 0.0 0.1   Macula Blunted foveal reflex, +CNV with pigment clumping/ring, heme stably  improved, shallow SRF stably improved, RPE mottling and clumping, [place]en Blunted foveal reflex, large area of GA with disciform scar and subretinal fibrosis, central pigment clumping, no heme   Vessels attenuated, mild tortuousity attenuated, Tortuous   Periphery Attached, scattered reticular degeneration, paving stone degeneration Attached, scattered reticular degeneration, scattered paving stone degeneration           IMAGING AND PROCEDURES  Imaging and Procedures for 02/07/2022  OCT, Retina - OU - Both Eyes       Right Eye Quality was good. Central Foveal Thickness: 327. Progression has been stable. Findings include abnormal foveal contour, subretinal hyper-reflective material, pigment epithelial detachment, choroidal neovascular membrane, no IRF, no SRF (Stable improvement in IRF/SRF overlying PED/SRHM).   Left Eye Quality was good. Central Foveal Thickness: 283. Progression has been stable. Findings include abnormal foveal contour, no IRF, no SRF, subretinal hyper-reflective material, outer retinal atrophy, lamellar hole (large central disciform scar with ORA, Foveal notch, trace cystic changes and temporal edema -- all stable).   Notes *Images captured and stored on drive  Diagnosis / Impression:  exu ARMD OU OD: stable improvement in IRF/SRF overlying PED/SRHM OS: large central disciform scar with ORA, Foveal notch, trace cystic changes and temporal edema -- all stable  Clinical management:  See below  Abbreviations: NFP - Normal foveal profile. CME - cystoid macular edema. PED - pigment epithelial detachment. IRF - intraretinal fluid. SRF - subretinal fluid. EZ - ellipsoid zone. ERM - epiretinal membrane. ORA - outer retinal atrophy. ORT - outer retinal tubulation. SRHM - subretinal hyper-reflective material. IRHM - intraretinal hyper-reflective material      Intravitreal  Injection, Pharmacologic Agent - OD - Right Eye       Time Out 02/07/2022. 2:01 PM. Confirmed  correct patient, procedure, site, and patient consented.   Anesthesia Topical anesthesia was used. Anesthetic medications included Lidocaine 2%, Proparacaine 0.5%.   Procedure Preparation included 5% betadine to ocular surface, eyelid speculum. A (32g) needle was used.   Injection: 1.25 mg Bevacizumab 1.25mg /0.26ml   Route: Intravitreal, Site: Right Eye   NDC: [id number], Lot: [id number], Expiration date: 03/06/2022   Post-op Post injection exam found visual acuity of at least counting fingers. The patient tolerated the procedure well. There were no complications. The patient received written and verbal post procedure care education. Post injection medications were not given.            ASSESSMENT/PLAN:   ICD-10-CM   1. Exudative age-related macular degeneration of right eye with active choroidal neovascularization (HCC)  H35.3211 OCT, Retina - OU - Both Eyes    Intravitreal Injection, Pharmacologic Agent - OD - Right Eye    Bevacizumab (AVASTIN) SOLN 1.25 mg    2. Exudative age-related macular degeneration of left eye with active choroidal neovascularization ([place])  H35.3221     3. Essential hypertension  I10     4. Hypertensive retinopathy of both eyes  H35.033     5. Combined forms of age-related cataract of both eyes  H25.813       1. Exudative age related macular degeneration OD  - pt initially presented w/ 2 wk history of decreased vision OD  - history of intravitreal injections OS with Dr. Zadie Rhine in 2017 and earlier -- pt reports stopping visits after the development of a corneal abrasion from a lid speculum.  - s/p IVA OD #1 (12.21.21), #2 (01.24.22), #3 (2.21.22), #4 (03.22.22), #5 (4.26.22), #6 (5.31.22), #7 (7.12.22), #8 (8.16.22), #9 (9.20.22), #10 (10.25.22), #11 (12.6.22), #12 (01.10.23)  **history of increased SRF at 6 wk interval, noted on 12.6.22**  - OCT shows OD: stable improvement in SRF overlying PED/SRHM at 5 wks  - BCVA OD 20/30              - Recommend  IVA OD #12 today, 02.14.23 w/ f/u in 5 wks  - RBA of procedure discussed, questions answered  - informed consent obtained and signed  - see procedure note  - f/u in 5 wks -- DFE/OCT, possible injection   2. Exudative age related macular degeneration, OS  - OS with history inactive disciform scar, but new focal IRH noted 5.31.22 -- resolved on exam today - s/p IVA OS #1 (05.31.22), #2 (07.12.22), #3 (8.15.22), #4 (9.20.22) for focal [facility]  - history of intravitreal injections OS with Dr. Zadie Rhine in 2017 and earlier -- pt reports stopping visits after the development of a corneal abrasion from a lid speculum  - has not seen a retina specialist since 2017  - exam and OCT OS shows stable disciform scar -- no IRF/SRF  - BCVA CF OS - recommend holding IVA OS today - f/u 5 weeks, DFE, OCT  3,4. Hypertensive retinopathy OU - discussed importance of tight BP control - monitor   5. Mixed Cataract OU - The symptoms of cataract, surgical options, and treatments and risks were discussed with patient. - discussed diagnosis and progression - approaching visual significance - monitor   Ophthalmic Meds Ordered this visit:  Meds ordered this encounter  Medications   Bevacizumab (AVASTIN) SOLN 1.25 mg      Return in about 5 weeks (around 03/14/2022) for  ex ARMD OD, Dilated Exam, OCT, Possible Injxn.  There are no Patient Instructions on file for this visit.  This document serves as a record of services personally performed by Gardiner Sleeper, MD, PhD. It was created on their behalf by Leonie Douglas, an ophthalmic technician. The creation of this record is the provider's dictation and/or activities during the visit.    Electronically signed by: Leonie Douglas COA, 02/08/22  8:56 PM  This document serves as a record of services personally performed by Gardiner Sleeper, MD, PhD. It was created on their behalf by San Jetty. Owens Shark, OA an ophthalmic technician. The creation of this record is the provider's  dictation and/or activities during the visit.    Electronically signed by: San Jetty. Owens Shark, [place] 02.14.2023 8:56 PM  Gardiner Sleeper, M.D., Ph.D. Diseases & Surgery of the Retina and Vitreous [facility]  I have reviewed the above documentation for accuracy and completeness, and I agree with the above. Gardiner Sleeper, M.D., Ph.D. 02/08/22 8:56 PM  Abbreviations: M myopia (nearsighted); A astigmatism; H hyperopia (farsighted); P presbyopia; Mrx spectacle prescription;  CTL contact lenses; OD right eye; OS left eye; OU both eyes  XT exotropia; ET esotropia; PEK punctate epithelial keratitis; PEE punctate epithelial erosions; DES dry eye syndrome; MGD meibomian gland dysfunction; ATs artificial tears; PFAT's preservative free artificial tears; [place] nuclear sclerotic cataract; PSC posterior subcapsular cataract; ERM epi-retinal membrane; PVD posterior vitreous detachment; RD retinal detachment; DM diabetes mellitus; DR diabetic retinopathy; NPDR non-proliferative diabetic retinopathy; PDR proliferative diabetic retinopathy; CSME clinically significant macular edema; DME diabetic macular edema; dbh dot blot hemorrhages; CWS cotton wool spot; POAG primary open angle glaucoma; C/D cup-to-disc ratio; HVF humphrey visual field; GVF goldmann visual field; OCT optical coherence tomography; IOP intraocular pressure; BRVO Branch retinal vein occlusion; CRVO central retinal vein occlusion; CRAO central retinal artery occlusion; BRAO branch retinal artery occlusion; RT retinal tear; SB scleral buckle; PPV pars plana vitrectomy; VH Vitreous hemorrhage; PRP panretinal laser photocoagulation; IVK intravitreal kenalog; VMT vitreomacular traction; MH Macular hole;  NVD neovascularization of the disc; NVE neovascularization elsewhere; AREDS age related eye disease study; ARMD age related macular degeneration; POAG primary open angle glaucoma; EBMD epithelial/anterior basement membrane dystrophy; ACIOL  anterior chamber intraocular lens; IOL intraocular lens; PCIOL posterior chamber intraocular lens; Phaco/IOL phacoemulsification with intraocular lens placement; [place] photorefractive keratectomy; LASIK laser assisted in situ keratomileusis; HTN hypertension; DM diabetes mellitus; COPD chronic obstructive pulmonary disease

## 2022-02-07 ENCOUNTER — Other Ambulatory Visit: Payer: Self-pay

## 2022-02-07 ENCOUNTER — Encounter (INDEPENDENT_AMBULATORY_CARE_PROVIDER_SITE_OTHER): Payer: Self-pay | Admitting: Ophthalmology

## 2022-02-07 ENCOUNTER — Ambulatory Visit (INDEPENDENT_AMBULATORY_CARE_PROVIDER_SITE_OTHER): Payer: Medicare Other | Admitting: Ophthalmology

## 2022-02-07 DIAGNOSIS — I10 Essential (primary) hypertension: Secondary | ICD-10-CM | POA: Diagnosis not present

## 2022-02-07 DIAGNOSIS — H35033 Hypertensive retinopathy, bilateral: Secondary | ICD-10-CM | POA: Diagnosis not present

## 2022-02-07 DIAGNOSIS — H353231 Exudative age-related macular degeneration, bilateral, with active choroidal neovascularization: Secondary | ICD-10-CM

## 2022-02-07 DIAGNOSIS — H25813 Combined forms of age-related cataract, bilateral: Secondary | ICD-10-CM | POA: Diagnosis not present

## 2022-02-07 DIAGNOSIS — H353221 Exudative age-related macular degeneration, left eye, with active choroidal neovascularization: Secondary | ICD-10-CM

## 2022-02-07 DIAGNOSIS — H353211 Exudative age-related macular degeneration, right eye, with active choroidal neovascularization: Secondary | ICD-10-CM

## 2022-02-08 MED ORDER — BEVACIZUMAB CHEMO INJECTION 1.25MG/0.05ML SYRINGE FOR KALEIDOSCOPE
1.2500 mg | INTRAVITREAL | Status: AC | PRN
  Administered 2022-02-08: 1.25 mg via INTRAVITREAL

## 2022-02-20 ENCOUNTER — Other Ambulatory Visit: Payer: Self-pay | Admitting: Cardiology

## 2022-03-08 ENCOUNTER — Encounter: Payer: Self-pay | Admitting: Cardiology

## 2022-03-08 ENCOUNTER — Other Ambulatory Visit: Payer: Self-pay

## 2022-03-08 ENCOUNTER — Ambulatory Visit (INDEPENDENT_AMBULATORY_CARE_PROVIDER_SITE_OTHER): Payer: Medicare Other | Admitting: Cardiology

## 2022-03-08 DIAGNOSIS — R0609 Other forms of dyspnea: Secondary | ICD-10-CM

## 2022-03-08 DIAGNOSIS — I2510 Atherosclerotic heart disease of native coronary artery without angina pectoris: Secondary | ICD-10-CM | POA: Diagnosis not present

## 2022-03-08 DIAGNOSIS — J449 Chronic obstructive pulmonary disease, unspecified: Secondary | ICD-10-CM | POA: Diagnosis not present

## 2022-03-08 DIAGNOSIS — I447 Left bundle-branch block, unspecified: Secondary | ICD-10-CM | POA: Diagnosis not present

## 2022-03-08 DIAGNOSIS — I700 Atherosclerosis of aorta: Secondary | ICD-10-CM | POA: Diagnosis not present

## 2022-03-08 MED ORDER — ROSUVASTATIN CALCIUM 20 MG PO TABS: 20.0000 mg | ORAL_TABLET | Freq: Every day | ORAL | 3 refills | Status: DC

## 2022-03-08 MED ORDER — EZETIMIBE 10 MG PO TABS: 10.0000 mg | ORAL_TABLET | Freq: Every day | ORAL | 3 refills | Status: DC

## 2022-03-08 NOTE — Assessment & Plan Note (Signed)
Continue with statin therapy.  ?

## 2022-03-08 NOTE — Assessment & Plan Note (Signed)
Pulmonary note reviewed.  Utilizes a walker.  Short of breath at baseline. ?

## 2022-03-08 NOTE — Progress Notes (Signed)
?Cardiology Office Note:   ? ?Date:  03/08/2022  ? ?ID:  Kimberly Waters, DOB Feb 26, 1946, MRN [id number] ? ?PCP:  Josetta Huddle, MD  ?[place] Cardiologist:  Candee Furbish, MD  ?[facility] Electrophysiologist:  None  ? ?Referring MD: Josetta Huddle, MD  ? ? ?History of Present Illness:   ? ?Kimberly Waters is a 76 y.o. female here for follow-up of coronary artery disease, left bundle branch block, COPD, fatigue. ? ?07/01/2020 MRI-confirmed lipomatous hypertrophy. ? ?She has been following with oncology secondary to breast cancer. ? ?Previously we decided to check the coronary CT scan because of dyspnea on exertion that felt different. Mother had 2-3 stents placed and died a little over a year ago.  She quit smoking 3 years ago with breast cancer. ? ?Today, she is doing fine. Recently, she has been feeling constantly fatigued. She takes 2 Benadryl tablets at night for her itch and tends to play on her phone until 1 AM before going to sleep. She is hoping to work on her routine. Her husband has been helping her with errands due to her fatigue.  ? ?She has been using her albuterol more often for allergies. She tries to avoid crowds of people due to risk of respiratory infection.  ? ?She used to work for the [employer] at [place] and started her workday at [employer] AM. She was in charge of transferring information between schools. ? ?She denies any palpitations, chest pain, lightheadedness, headaches, syncope, orthopnea, PND, lower extremity edema or exertional symptoms. ? ?Past Medical History:  ?Diagnosis Date  ? Allergy   ? Breast cancer ([place])   ? Cataract   ? Colon polyps   ? Depression   ? Dyspnea   ? SEASONAL  ? Family history of breast cancer   ? Family history of colon cancer   ? GERD (gastroesophageal reflux disease)   ? History of radiation therapy 06/05/17-07/20/17  ? right chest wall 50.4 Gy in 28 fractions, right axillary nodal region 45 Gy in 25 fractions  ? Hyperlipidemia   ? Hypertension   ? Hypertensive  retinopathy   ? Hypothyroidism   ? Macular degeneration   ? Peripheral neuropathy 06/18/2019  ? PONV (postoperative nausea and vomiting)   ? ? ?Past Surgical History:  ?Procedure Laterality Date  ? ABDOMINAL HYSTERECTOMY    ? KNEE SURGERY Right   ? MASTECTOMY W/ SENTINEL NODE BIOPSY Right 03/05/2017  ? Procedure: BILATERAL TOTAL MASTECTOMIES WITH RIGHT SENTINEL LYMPH NODE BIOPSIES;  Surgeon: Excell Seltzer, MD;  Location: [place];  Service: General;  Laterality: Right;  ? SHOULDER SURGERY    ? BILAT  ? ? ?Current Medications: ?Current Meds  ?Medication Sig  ? albuterol (PROVENTIL,VENTOLIN) 90 MCG/ACT inhaler Inhale 2 puffs into the lungs every 4 (four) hours as needed.  ? anastrozole (ARIMIDEX) 1 MG tablet Take 1 tablet (1 mg total) by mouth daily. Stop May 2023  ? arformoterol (BROVANA) 15 MCG/2ML NEBU Take 2 mLs (15 mcg total) by nebulization 2 (two) times daily.  ? betamethasone dipropionate 2.77 % cream 1 application  ? buPROPion (WELLBUTRIN SR) 150 MG 12 hr tablet Take 150 mg by mouth daily.   ? clobetasol ointment (TEMOVATE) 0.05 % as needed.  ? diphenhydrAMINE (BENADRYL) 25 MG tablet Take 25 mg by mouth as needed for sleep.  ? escitalopram (LEXAPRO) 10 MG tablet Take 1 tablet (10 mg total) by mouth daily.  ? furosemide (LASIX) 40 MG tablet Take 1 tablet (40 mg total) by  mouth daily.  ? hydrOXYzine (ATARAX/VISTARIL) 25 MG tablet Take 25 mg by mouth at bedtime.  ? imipramine (TOFRANIL) 50 MG tablet Take 50 mg by mouth at bedtime.  ? ipratropium (ATROVENT) 0.06 % nasal spray Place 2 sprays into the nose 4 (four) times daily.  ? ketoconazole (NIZORAL) 2 % cream Apply 1 application topically daily.  ? levocetirizine (XYZAL) 5 MG tablet Take 5 mg by mouth as needed for allergies.  ? levothyroxine (SYNTHROID, LEVOTHROID) 112 MCG tablet TAKE ONE TABLET BY MOUTH ONCE DAILY  ? meloxicam (MOBIC) 7.5 MG tablet Take 7.5 mg by mouth at bedtime.  ? mometasone (ELOCON) 0.1 % ointment Apply topically as needed.  ?  nitroGLYCERIN (NITROSTAT) 0.4 MG SL tablet as needed.  ? omeprazole (PRILOSEC) 20 MG capsule Take 20 mg by mouth daily.  ? Potassium Chloride ER 20 MEQ TBCR 1 tablet with food  ? revefenacin (YUPELRI) 175 MCG/3ML nebulizer solution Take 3 mLs (175 mcg total) by nebulization daily.  ? tacrolimus (PROTOPIC) 0.1 % ointment Apply topically as needed.  ? Tiotropium Bromide-Olodaterol (STIOLTO RESPIMAT) 2.5-2.5 MCG/ACT AERS Inhale 2 puffs into the lungs daily.  ? Tiotropium Bromide-Olodaterol (STIOLTO RESPIMAT) 2.5-2.5 MCG/ACT AERS Inhale 2 puffs into the lungs daily.  ? [DISCONTINUED] ezetimibe (ZETIA) 10 MG tablet TAKE 1 TABLET BY MOUTH EVERY DAY  ? [DISCONTINUED] rosuvastatin (CRESTOR) 20 MG tablet Take 1 tablet (20 mg total) by mouth daily. Keep appointment in March for future refills.  ? [DISCONTINUED] simvastatin (ZOCOR) 20 MG tablet Take 1 tablet by mouth every evening.  ?  ? ?Allergies:   Latex, Adhesive [tape], Codeine, and Other  ? ?Social History  ? ?Socioeconomic History  ? Marital status: Married  ?  Spouse name: Not on file  ? Number of children: 2  ? Years of education: Not on file  ? Highest education level: Not on file  ?Occupational History  ? Occupation: reitred [employer]  ?Tobacco Use  ? Smoking status: Former  ?  Packs/day: 1.00  ?  Years: 45.00  ?  Pack years: 45.00  ?  Types: Cigarettes  ?  Quit date: 02/22/2017  ?  Years since quitting: 5.0  ? Smokeless tobacco: Never  ?Vaping Use  ? Vaping Use: Never used  ?Substance and Sexual Activity  ? Alcohol use: No  ?  Alcohol/week: 0.0 standard drinks  ? Drug use: No  ? Sexual activity: Not on file  ?Other Topics Concern  ? Not on file  ?Social History Narrative  ? Not on file  ? ?Social Determinants of Health  ? ?Financial Resource Strain: Not on file  ?Food Insecurity: Not on file  ?Transportation Needs: Not on file  ?Physical Activity: Not on file  ?Stress: Not on file  ?Social Connections: Not on file  ?  ? ?Family History: ?The patient's  family history includes Breast cancer (age of onset: 46) in her cousin; Breast cancer (age of onset: 16) in her paternal aunt; Colon cancer in her maternal uncle and maternal uncle; Colon cancer (age of onset: 71) in her maternal grandfather; Colon cancer (age of onset: 58) in her maternal aunt; Head & neck cancer in her maternal uncle; Heart disease (age of onset: 53) in her mother; Lung cancer in her paternal grandfather. ? ?ROS:   ?Please see the history of present illness.    ?(+) Malaise/Fatigue ?(+) Allergies ?All other systems reviewed and are negative. ? ?EKGs/Labs/Other Studies Reviewed:   ?The following studies were reviewed today: ?Cardiac MRI  07/01/20 ?IMPRESSION: ?1. Very prominent epicardial adipose tissue as well as lipomatous ?atrial septal hypertrophy extending into the right atrial free wall. ?  ?2.  Normal LV size and systolic function, EF 03%. ?  ?3.  Normal RV size and systolic function, EF 50%. ?  ?4. No myocardial LGE, so no definitive evidence for prior MI, ?infiltrative disease, or myocarditis. ? ?CT Coronary FFR 05/14/20 ?IMPRESSION: ?1. CT FFR analysis didn't show any significant stenosis. Distal LAD ?0.81. ?  ?2.  Continue with aggressive medical management. ? ?CT Coronary Morph with Calcium Score 05/14/20 ?IMPRESSION: ?1. Coronary calcium score of 131. This was 25 percentile for age and ?sex matched control. ?  ?2. Normal coronary origin with right dominance. ?  ?3. Normal RCA, scattered LAD calcified plaque up to 74% at ?bifurcation of mid diagonal (sending for FFR) and non flow limiting ?proximal circumflex stenosis (0-24%). ?  ?4.  Aortic atherosclerosis. ?  ?5. Lipomatous hypertrophy of interatrial septum. Given prominent ?nature of fatty deposition, recommend cardiac MRI for further ?clarification. ? ?IMPRESSION: ?1.  No acute findings in the imaged extracardiac chest. ?2.  Aortic Atherosclerosis (ICD10-I70.0). ?3. Hepatic steatosis. ? ?EKG:  EKG was personally reviewed ?03/08/22: Sinus  rhythm, rate 86 bpm; LBBB ?10/26/20: sinus rhythm left in a branch block 78 bpm ? ?Recent Labs: ?11/21/2021: ALT 30; BUN 17; Creatinine, Ser 1.50; Hemoglobin 13.1; Platelets 289; Potassium 3.5; Sodium 140  ?Recent

## 2022-03-08 NOTE — Patient Instructions (Signed)
Medication Instructions:  The current medical regimen is effective;  continue present plan and medications.  *If you need a refill on your cardiac medications before your next appointment, please call your pharmacy*  Follow-Up: At [facility], you and your health needs are our priority.  As part of our continuing mission to provide you with exceptional heart care, we have created designated Provider Care Teams.  These Care Teams include your primary Cardiologist (physician) and Advanced Practice Providers (APPs -  Physician Assistants and Nurse Practitioners) who all work together to provide you with the care you need, when you need it.  We recommend signing up for the patient portal called "MyChart".  Sign up information is provided on this After Visit Summary.  MyChart is used to connect with patients for Virtual Visits (Telemedicine).  Patients are able to view lab/test results, encounter notes, upcoming appointments, etc.  Non-urgent messages can be sent to your provider as well.   To learn more about what you can do with MyChart, go to [email or web address].    Your next appointment:   1 year(s)  The format for your next appointment:   In Person  Provider:   Mark Skains, MD   Thank you for choosing  [facility]!!    

## 2022-03-08 NOTE — Assessment & Plan Note (Signed)
Main complaint has been fatigue.  She does take 2 Benadryl's before she goes to bed and then she lays in the bed till 1 AM.  Hard for her to get up in the morning.  She states that she takes the Benadryl to help her with her itching at night.  She is going to try to work on her sleep hygiene. ?

## 2022-03-08 NOTE — Assessment & Plan Note (Signed)
Coronary CT scan reviewed from 2021-calcium score was 131, 72nd percentile.  There was scattered LAD plaque, FFR was negative.  MRI confirmed lipomatous hypertrophy of the septum.  Benign.  We will go ahead and prescribe once again Crestor and Zetia.  She does not believe that she has been taking these medications. ?

## 2022-03-08 NOTE — Assessment & Plan Note (Signed)
EKG today stable.  She is having no high risk symptoms such as syncope.  No pacemaker indications. ?

## 2022-03-10 NOTE — Progress Notes (Signed)
?[facility] Note ? ?03/14/2022 ? ?  ? ?CHIEF COMPLAINT ?Patient presents for Retina Follow Up ? ?HISTORY OF PRESENT ILLNESS: ?Kimberly Waters is a 76 y.o. female who presents to the clinic today for:  ?HPI   ? ? Retina Follow Up   ?Patient presents with  Wet AMD.  In right eye.  Severity is moderate.  Duration of 5 weeks.  Since onset it is stable.  I, the attending physician,  performed the HPI with the patient and updated documentation appropriately. ? ?  ?  ? ? Comments   ?5 week Retina follow up for Exu armd od. Patient states she did not bring her most recent glasses, they are missing a screw. Patient feels like vision is about the same ? ?  ?  ?Last edited by Bernarda Caffey, MD on 03/14/2022  4:05 PM.  ?  ?Pt states her old glasses are broken, so she is having to use her old glasses and she cannot see as well out of them ? ?Referring physician: ?Josetta Huddle, MD ?[place] ?[address] ?[place],  [place] [address] ? ?HISTORICAL INFORMATION:  ? ?Selected notes from the [place] ?Referred by Dr. Marin Comment for eval of decreased VA OD.  ? ?CURRENT MEDICATIONS: ?No current outpatient medications on file. (Ophthalmic Drugs)  ? ?No current facility-administered medications for this visit. (Ophthalmic Drugs)  ? ?Current Outpatient Medications (Other)  ?Medication Sig  ? albuterol (PROVENTIL,VENTOLIN) 90 MCG/ACT inhaler Inhale 2 puffs into the lungs every 4 (four) hours as needed.  ? anastrozole (ARIMIDEX) 1 MG tablet Take 1 tablet (1 mg total) by mouth daily. Stop May 2023  ? arformoterol (BROVANA) 15 MCG/2ML NEBU Take 2 mLs (15 mcg total) by nebulization 2 (two) times daily.  ? betamethasone dipropionate 9.89 % cream 1 application  ? buPROPion (WELLBUTRIN SR) 150 MG 12 hr tablet Take 150 mg by mouth daily.   ? clobetasol ointment (TEMOVATE) 0.05 % as needed.  ? diphenhydrAMINE (BENADRYL) 25 MG tablet Take 25 mg by mouth as needed for sleep.  ? escitalopram (LEXAPRO) 10 MG tablet Take  1 tablet (10 mg total) by mouth daily.  ? ezetimibe (ZETIA) 10 MG tablet Take 1 tablet (10 mg total) by mouth daily.  ? furosemide (LASIX) 40 MG tablet Take 1 tablet (40 mg total) by mouth daily.  ? hydrOXYzine (ATARAX/VISTARIL) 25 MG tablet Take 25 mg by mouth at bedtime.  ? imipramine (TOFRANIL) 50 MG tablet Take 50 mg by mouth at bedtime.  ? ipratropium (ATROVENT) 0.06 % nasal spray Place 2 sprays into the nose 4 (four) times daily.  ? ketoconazole (NIZORAL) 2 % cream Apply 1 application topically daily.  ? levocetirizine (XYZAL) 5 MG tablet Take 5 mg by mouth as needed for allergies.  ? levothyroxine (SYNTHROID, LEVOTHROID) 112 MCG tablet TAKE ONE TABLET BY MOUTH ONCE DAILY  ? meloxicam (MOBIC) 7.5 MG tablet Take 7.5 mg by mouth at bedtime.  ? mometasone (ELOCON) 0.1 % ointment Apply topically as needed.  ? nitroGLYCERIN (NITROSTAT) 0.4 MG SL tablet as needed.  ? omeprazole (PRILOSEC) 20 MG capsule Take 20 mg by mouth daily.  ? Potassium Chloride ER 20 MEQ TBCR 1 tablet with food  ? revefenacin (YUPELRI) 175 MCG/3ML nebulizer solution Take 3 mLs (175 mcg total) by nebulization daily.  ? rosuvastatin (CRESTOR) 20 MG tablet Take 1 tablet (20 mg total) by mouth daily.  ? tacrolimus (PROTOPIC) 0.1 % ointment Apply topically as  needed.  ? Tiotropium Bromide-Olodaterol (STIOLTO RESPIMAT) 2.5-2.5 MCG/ACT AERS Inhale 2 puffs into the lungs daily.  ? Tiotropium Bromide-Olodaterol (STIOLTO RESPIMAT) 2.5-2.5 MCG/ACT AERS Inhale 2 puffs into the lungs daily.  ? albuterol (VENTOLIN HFA) 108 (90 Base) MCG/ACT inhaler Inhale 2 puffs into the lungs every 4 (four) hours as needed. (Patient not taking: Reported on 03/08/2022)  ? ?No current facility-administered medications for this visit. (Other)  ? ?REVIEW OF SYSTEMS: ?ROS   ?Positive for: Neurological, Eyes, Respiratory ?Negative for: Constitutional, Gastrointestinal, Skin, Genitourinary, Musculoskeletal, HENT, Endocrine, Cardiovascular, Psychiatric, Allergic/Imm,  Heme/Lymph ?Last edited by Elmore Guise, COT on 03/14/2022  1:47 PM.  ?  ? ?ALLERGIES ?Allergies  ?Allergen Reactions  ? Latex Other (See Comments)  ?  Other reaction(s): Other (See Comments) ?Tears skin.  ? Adhesive [Tape] Other (See Comments)  ?  (skin tears)  ?Tolerates PAPER TAPE  ? Codeine Nausea Only  ? Other Nausea Only  ?  Anesthesia--nausea   ? ?PAST MEDICAL HISTORY ?Past Medical History:  ?Diagnosis Date  ? Allergy   ? Breast cancer ([place])   ? Cataract   ? Colon polyps   ? Depression   ? Dyspnea   ? SEASONAL  ? Family history of breast cancer   ? Family history of colon cancer   ? GERD (gastroesophageal reflux disease)   ? History of radiation therapy 06/05/17-07/20/17  ? right chest wall 50.4 Gy in 28 fractions, right axillary nodal region 45 Gy in 25 fractions  ? Hyperlipidemia   ? Hypertension   ? Hypertensive retinopathy   ? Hypothyroidism   ? Macular degeneration   ? Peripheral neuropathy 06/18/2019  ? PONV (postoperative nausea and vomiting)   ? ?Past Surgical History:  ?Procedure Laterality Date  ? ABDOMINAL HYSTERECTOMY    ? KNEE SURGERY Right   ? MASTECTOMY W/ SENTINEL NODE BIOPSY Right 03/05/2017  ? Procedure: BILATERAL TOTAL MASTECTOMIES WITH RIGHT SENTINEL LYMPH NODE BIOPSIES;  Surgeon: Excell Seltzer, MD;  Location: [facility];  Service: General;  Laterality: Right;  ? SHOULDER SURGERY    ? BILAT  ? ?FAMILY HISTORY ?Family History  ?Problem Relation Age of Onset  ? Heart disease Mother 34  ?     stents  ? Colon cancer Maternal Grandfather 58  ? Colon cancer Maternal Aunt 24  ? Colon cancer Maternal Uncle   ?     dx in his 39s  ? Breast cancer Paternal Aunt 58  ? Breast cancer Cousin 12  ?     paternal first cousin  ? Lung cancer Paternal Grandfather   ? Colon cancer Maternal Uncle   ? Head & neck cancer Maternal Uncle   ?     oral cancer  ? ?SOCIAL HISTORY ?Social History  ? ?Tobacco Use  ? Smoking status: Former  ?  Packs/day: 1.00  ?  Years: 45.00  ?  Pack years: 45.00  ?  Types: Cigarettes  ?   Quit date: 02/22/2017  ?  Years since quitting: 5.0  ? Smokeless tobacco: Never  ?Vaping Use  ? Vaping Use: Never used  ?Substance Use Topics  ? Alcohol use: No  ?  Alcohol/week: 0.0 standard drinks  ? Drug use: No  ?  ? ?  ?OPHTHALMIC EXAM: ?Base Eye Exam   ? ? Visual Acuity (Snellen - Linear)   ? ?   Right Left  ? Dist [place] 20/400 20/CF  ? Dist ph cc 20/150 20/NI  ?Pt's primary glasses are broken -- testing  with old pair of glasses today ? ?  ?  ? ? Tonometry (Tonopen, 1:50 PM)   ? ?   Right Left  ? Pressure 17 18  ? ?  ?  ? ? Pupils   ? ?   Dark Light Shape React APD  ? Right 3 2 Round Minimal None  ? Left 3 2 Round Minimal None  ? ?  ?  ? ? Visual Fields (Counting fingers)   ? ?   Left Right  ?  Full Full  ? ?  ?  ? ? Extraocular Movement   ? ?   Right Left  ?  Full, Ortho Full, Ortho  ? ?  ?  ? ? Neuro/Psych   ? ? Oriented x3: Yes  ? Mood/Affect: Normal  ? ?  ?  ? ? Dilation   ? ? Both eyes: 1.0% Mydriacyl, 2.5% Phenylephrine @ 1:50 PM  ? ?  ?  ? ?  ? ?Slit Lamp and Fundus Exam   ? ? Slit Lamp Exam   ? ?   Right Left  ? Lids/Lashes Dermatochalasis - upper lid Dermatochalasis - upper lid  ? Conjunctiva/Sclera nasal and temporal pinguecula nasal and temporal pinguecula  ? Cornea 2+ Punctate epithelial erosions, irregular epi, arcus, decreased TBUT 2+ Punctate epithelial erosions, irregular epi, arcus, decreased TBUT  ? Anterior Chamber deep, narrow temporal angle deep, narrow temporal angle  ? Iris Round and moderately dilated, mild anterior bowing Round and moderately dilated, mild anterior bowing  ? Lens 2-3+ Nuclear sclerosis with early brunescence, 2-3+ Cortical cataract 2-3+ Nuclear sclerosis with early brunescence, 2-3+ Cortical cataract  ? Anterior Vitreous Vitreous syneresis, Posterior vitreous detachment Vitreous syneresis, Posterior vitreous detachment  ? ?  ?  ? ? Fundus Exam   ? ?   Right Left  ? Disc Pink and Sharp, Compact, mild PPA Mild Pallor, Sharp rim, +elevation  ? C/D Ratio 0.0 0.1  ? Macula  Blunted foveal reflex, +CNV with pigment clumping/ring, heme stably improved, shallow SRF stably improved, RPE mottling and clumping, [place] Blunted foveal reflex, large area of GA with disciform scar and subretinal f

## 2022-03-14 ENCOUNTER — Encounter (INDEPENDENT_AMBULATORY_CARE_PROVIDER_SITE_OTHER): Payer: Self-pay | Admitting: Ophthalmology

## 2022-03-14 ENCOUNTER — Other Ambulatory Visit: Payer: Self-pay

## 2022-03-14 ENCOUNTER — Ambulatory Visit (INDEPENDENT_AMBULATORY_CARE_PROVIDER_SITE_OTHER): Payer: Medicare Other | Admitting: Ophthalmology

## 2022-03-14 DIAGNOSIS — H353221 Exudative age-related macular degeneration, left eye, with active choroidal neovascularization: Secondary | ICD-10-CM

## 2022-03-14 DIAGNOSIS — H35033 Hypertensive retinopathy, bilateral: Secondary | ICD-10-CM

## 2022-03-14 DIAGNOSIS — H353211 Exudative age-related macular degeneration, right eye, with active choroidal neovascularization: Secondary | ICD-10-CM

## 2022-03-14 DIAGNOSIS — H353231 Exudative age-related macular degeneration, bilateral, with active choroidal neovascularization: Secondary | ICD-10-CM

## 2022-03-14 DIAGNOSIS — H25813 Combined forms of age-related cataract, bilateral: Secondary | ICD-10-CM

## 2022-03-14 DIAGNOSIS — I10 Essential (primary) hypertension: Secondary | ICD-10-CM

## 2022-03-14 MED ORDER — BEVACIZUMAB CHEMO INJECTION 1.25MG/0.05ML SYRINGE FOR KALEIDOSCOPE
1.2500 mg | INTRAVITREAL | Status: AC | PRN
  Administered 2022-03-14: 1.25 mg via INTRAVITREAL

## 2022-03-29 DIAGNOSIS — H9190 Unspecified hearing loss, unspecified ear: Secondary | ICD-10-CM | POA: Diagnosis not present

## 2022-03-29 DIAGNOSIS — I10 Essential (primary) hypertension: Secondary | ICD-10-CM | POA: Diagnosis not present

## 2022-03-29 DIAGNOSIS — E039 Hypothyroidism, unspecified: Secondary | ICD-10-CM | POA: Diagnosis not present

## 2022-03-29 DIAGNOSIS — F322 Major depressive disorder, single episode, severe without psychotic features: Secondary | ICD-10-CM | POA: Diagnosis not present

## 2022-03-29 DIAGNOSIS — K219 Gastro-esophageal reflux disease without esophagitis: Secondary | ICD-10-CM | POA: Diagnosis not present

## 2022-03-29 DIAGNOSIS — E559 Vitamin D deficiency, unspecified: Secondary | ICD-10-CM | POA: Diagnosis not present

## 2022-03-29 DIAGNOSIS — Z0001 Encounter for general adult medical examination with abnormal findings: Secondary | ICD-10-CM | POA: Diagnosis not present

## 2022-03-29 DIAGNOSIS — I509 Heart failure, unspecified: Secondary | ICD-10-CM | POA: Diagnosis not present

## 2022-03-29 DIAGNOSIS — H3530 Unspecified macular degeneration: Secondary | ICD-10-CM | POA: Diagnosis not present

## 2022-03-29 DIAGNOSIS — I700 Atherosclerosis of aorta: Secondary | ICD-10-CM | POA: Diagnosis not present

## 2022-03-29 DIAGNOSIS — J439 Emphysema, unspecified: Secondary | ICD-10-CM | POA: Diagnosis not present

## 2022-03-29 DIAGNOSIS — M170 Bilateral primary osteoarthritis of knee: Secondary | ICD-10-CM | POA: Diagnosis not present

## 2022-04-13 NOTE — Progress Notes (Incomplete)
?[facility] Note ? ?04/18/2022 ? ?  ? ?CHIEF COMPLAINT ?Patient presents for No chief complaint on file. ? ?HISTORY OF PRESENT ILLNESS: ?Kimberly Waters is a 76 y.o. female who presents to the clinic today for:  ? ?Pt states her old glasses are broken, so she is having to use her old glasses and she cannot see as well out of them ? ?Referring physician: ?Josetta Huddle, MD ?[place] ?[address] ?[place],  [place] [address] ? ?HISTORICAL INFORMATION:  ? ?Selected notes from the [place] ?Referred by Dr. Marin Comment for eval of decreased VA OD.  ? ?CURRENT MEDICATIONS: ?No current outpatient medications on file. (Ophthalmic Drugs)  ? ?No current facility-administered medications for this visit. (Ophthalmic Drugs)  ? ?Current Outpatient Medications (Other)  ?Medication Sig  ? albuterol (PROVENTIL,VENTOLIN) 90 MCG/ACT inhaler Inhale 2 puffs into the lungs every 4 (four) hours as needed.  ? albuterol (VENTOLIN HFA) 108 (90 Base) MCG/ACT inhaler Inhale 2 puffs into the lungs every 4 (four) hours as needed. (Patient not taking: Reported on 03/08/2022)  ? anastrozole (ARIMIDEX) 1 MG tablet Take 1 tablet (1 mg total) by mouth daily. Stop May 2023  ? arformoterol (BROVANA) 15 MCG/2ML NEBU Take 2 mLs (15 mcg total) by nebulization 2 (two) times daily.  ? betamethasone dipropionate 3.14 % cream 1 application  ? buPROPion (WELLBUTRIN SR) 150 MG 12 hr tablet Take 150 mg by mouth daily.   ? clobetasol ointment (TEMOVATE) 0.05 % as needed.  ? diphenhydrAMINE (BENADRYL) 25 MG tablet Take 25 mg by mouth as needed for sleep.  ? escitalopram (LEXAPRO) 10 MG tablet Take 1 tablet (10 mg total) by mouth daily.  ? ezetimibe (ZETIA) 10 MG tablet Take 1 tablet (10 mg total) by mouth daily.  ? furosemide (LASIX) 40 MG tablet Take 1 tablet (40 mg total) by mouth daily.  ? hydrOXYzine (ATARAX/VISTARIL) 25 MG tablet Take 25 mg by mouth at bedtime.  ? imipramine (TOFRANIL) 50 MG tablet Take 50 mg by mouth at  bedtime.  ? ipratropium (ATROVENT) 0.06 % nasal spray Place 2 sprays into the nose 4 (four) times daily.  ? ketoconazole (NIZORAL) 2 % cream Apply 1 application topically daily.  ? levocetirizine (XYZAL) 5 MG tablet Take 5 mg by mouth as needed for allergies.  ? levothyroxine (SYNTHROID, LEVOTHROID) 112 MCG tablet TAKE ONE TABLET BY MOUTH ONCE DAILY  ? meloxicam (MOBIC) 7.5 MG tablet Take 7.5 mg by mouth at bedtime.  ? mometasone (ELOCON) 0.1 % ointment Apply topically as needed.  ? nitroGLYCERIN (NITROSTAT) 0.4 MG SL tablet as needed.  ? omeprazole (PRILOSEC) 20 MG capsule Take 20 mg by mouth daily.  ? Potassium Chloride ER 20 MEQ TBCR 1 tablet with food  ? revefenacin (YUPELRI) 175 MCG/3ML nebulizer solution Take 3 mLs (175 mcg total) by nebulization daily.  ? rosuvastatin (CRESTOR) 20 MG tablet Take 1 tablet (20 mg total) by mouth daily.  ? tacrolimus (PROTOPIC) 0.1 % ointment Apply topically as needed.  ? Tiotropium Bromide-Olodaterol (STIOLTO RESPIMAT) 2.5-2.5 MCG/ACT AERS Inhale 2 puffs into the lungs daily.  ? Tiotropium Bromide-Olodaterol (STIOLTO RESPIMAT) 2.5-2.5 MCG/ACT AERS Inhale 2 puffs into the lungs daily.  ? ?No current facility-administered medications for this visit. (Other)  ? ?REVIEW OF SYSTEMS: ? ? ?ALLERGIES ?Allergies  ?Allergen Reactions  ? Latex Other (See Comments)  ?  Other reaction(s): Other (See Comments) ?Tears skin.  ? Adhesive [Tape] Other (See Comments)  ?  (  skin tears)  ?Tolerates PAPER TAPE  ? Codeine Nausea Only  ? Other Nausea Only  ?  Anesthesia--nausea   ? ?PAST MEDICAL HISTORY ?Past Medical History:  ?Diagnosis Date  ? Allergy   ? Breast cancer ([place])   ? Cataract   ? Colon polyps   ? Depression   ? Dyspnea   ? SEASONAL  ? Family history of breast cancer   ? Family history of colon cancer   ? GERD (gastroesophageal reflux disease)   ? History of radiation therapy 06/05/17-07/20/17  ? right chest wall 50.4 Gy in 28 fractions, right axillary nodal region 45 Gy in 25 fractions  ?  Hyperlipidemia   ? Hypertension   ? Hypertensive retinopathy   ? Hypothyroidism   ? Macular degeneration   ? Peripheral neuropathy 06/18/2019  ? PONV (postoperative nausea and vomiting)   ? ?Past Surgical History:  ?Procedure Laterality Date  ? ABDOMINAL HYSTERECTOMY    ? KNEE SURGERY Right   ? MASTECTOMY W/ SENTINEL NODE BIOPSY Right 03/05/2017  ? Procedure: BILATERAL TOTAL MASTECTOMIES WITH RIGHT SENTINEL LYMPH NODE BIOPSIES;  Surgeon: Excell Seltzer, MD;  Location: [facility];  Service: General;  Laterality: Right;  ? SHOULDER SURGERY    ? BILAT  ? ?FAMILY HISTORY ?Family History  ?Problem Relation Age of Onset  ? Heart disease Mother 50  ?     stents  ? Colon cancer Maternal Grandfather 91  ? Colon cancer Maternal Aunt 38  ? Colon cancer Maternal Uncle   ?     dx in his 46s  ? Breast cancer Paternal Aunt 32  ? Breast cancer Cousin 81  ?     paternal first cousin  ? Lung cancer Paternal Grandfather   ? Colon cancer Maternal Uncle   ? Head & neck cancer Maternal Uncle   ?     oral cancer  ? ?SOCIAL HISTORY ?Social History  ? ?Tobacco Use  ? Smoking status: Former  ?  Packs/day: 1.00  ?  Years: 45.00  ?  Pack years: 45.00  ?  Types: Cigarettes  ?  Quit date: 02/22/2017  ?  Years since quitting: 5.1  ? Smokeless tobacco: Never  ?Vaping Use  ? Vaping Use: Never used  ?Substance Use Topics  ? Alcohol use: No  ?  Alcohol/week: 0.0 standard drinks  ? Drug use: No  ?  ? ?  ?OPHTHALMIC EXAM: ?Not recorded ?  ? ?IMAGING AND PROCEDURES  ?Imaging and Procedures for 04/18/2022 ? ? ?  ?  ? ?  ?ASSESSMENT/PLAN: ?No diagnosis found. ? ?1. Exudative age related macular degeneration OD ? - pt initially presented w/ 2 wk history of decreased vision OD ? - history of intravitreal injections OS with Dr. Zadie Rhine in 2017 and earlier -- pt reports stopping visits after the development of a corneal abrasion from a lid speculum. ? - s/p IVA OD #1 (12.21.21), #2 (01.24.22), #3 (2.21.22), #4 (03.22.22), #5 (4.26.22), #6 (5.31.22), #7 (7.12.22), #8  (8.16.22), #9 (9.20.22), #10 (10.25.22), #11 (12.6.22), #12 (01.10.23), #13 (02.14.23) ? **history of increased SRF at 6 wk interval, noted on 12.6.22** ? - OCT shows OD: stable improvement in SRF overlying PED/SRHM at 5 wks ? - BCVA OD decreased to 20/150 from 20/30 (tested with old glasses) ?            - Recommend IVA OD #14 today, 03.21.23 w/ f/u in 6 wks ? - RBA of procedure discussed, questions answered ? - informed consent obtained  and signed ? - see procedure note ? - f/u in 6 wks -- DFE/OCT, possible injection  ? ?2. Exudative age related macular degeneration, OS ? - OS with history inactive disciform scar, but new focal IRH noted 5.31.22 -- resolved on exam today ?- s/p IVA OS #1 (05.31.22), #2 (07.12.22), #3 (8.15.22), #4 (9.20.22) for focal IRH ? - history of intravitreal injections OS with Dr. Zadie Rhine in 2017 and earlier -- pt reports stopping visits after the development of a corneal abrasion from a lid speculum ? - before being referred here, had not seen a retina specialist since 2017 ? - exam and OCT OS shows stable disciform scar -- no IRF/SRF ? - BCVA CF OS ?- recommend holding IVA OS today ?- f/u 6 weeks, DFE, OCT ? ?3,4. Hypertensive retinopathy OU ?- discussed importance of tight BP control ?-monitor ? ?5. Mixed Cataract OU ?- The symptoms of cataract, surgical options, and treatments and risks were discussed with patient. ?- discussed diagnosis and progression ?- approaching visual significance ?-will refer to [facility] Opth./ Dr. Lucianne Lei for cataract eval ? ? ?Ophthalmic Meds Ordered this visit:  ?No orders of the defined types were placed in this encounter. ?  ? ?No follow-ups on file. ? ?There are no Patient Instructions on file for this visit. ? ?This document serves as a record of services personally performed by Gardiner Sleeper, MD, PhD. It was created on their behalf by Annie Paras, COT, an ophthalmic technician. The creation of this record is the provider's dictation and/or activities  during the visit.   ? ?Electronically signed by: Annie Paras, COT 04/13/22 10:29 AM  ? ? ? ?Gardiner Sleeper, M.D., Ph.D. ?Diseases & Surgery of the Retina and Vitreous ?[facility]

## 2022-04-18 ENCOUNTER — Encounter (INDEPENDENT_AMBULATORY_CARE_PROVIDER_SITE_OTHER): Payer: Self-pay | Admitting: Ophthalmology

## 2022-04-18 ENCOUNTER — Ambulatory Visit (INDEPENDENT_AMBULATORY_CARE_PROVIDER_SITE_OTHER): Payer: Medicare Other | Admitting: Ophthalmology

## 2022-04-18 DIAGNOSIS — I10 Essential (primary) hypertension: Secondary | ICD-10-CM

## 2022-04-18 DIAGNOSIS — H353231 Exudative age-related macular degeneration, bilateral, with active choroidal neovascularization: Secondary | ICD-10-CM

## 2022-04-18 DIAGNOSIS — H353211 Exudative age-related macular degeneration, right eye, with active choroidal neovascularization: Secondary | ICD-10-CM

## 2022-04-18 DIAGNOSIS — H25813 Combined forms of age-related cataract, bilateral: Secondary | ICD-10-CM

## 2022-04-18 DIAGNOSIS — H353221 Exudative age-related macular degeneration, left eye, with active choroidal neovascularization: Secondary | ICD-10-CM

## 2022-04-18 DIAGNOSIS — H35033 Hypertensive retinopathy, bilateral: Secondary | ICD-10-CM | POA: Diagnosis not present

## 2022-04-18 NOTE — Progress Notes (Addendum)
?[facility] Note ? ?04/18/2022 ? ?  ? ?CHIEF COMPLAINT ?Patient presents for Retina Follow Up ? ?HISTORY OF PRESENT ILLNESS: ?Kimberly Waters is a 76 y.o. female who presents to the clinic today for:  ?HPI   ? ? Retina Follow Up   ?Patient presents with  Wet AMD.  In right eye.  This started 6 weeks ago.  I, the attending physician,  performed the HPI with the patient and updated documentation appropriately. ? ?  ?  ? ? Comments   ?Patient here for 6 weeks retina follow up for exu ARMD OD. Patient states vision doing ok. No eye pain. Has appointment for cataract evaluation May 9th.  ? ?  ?  ?Last edited by Bernarda Caffey, MD on 04/19/2022  5:12 PM.  ?  ?Pt states vision doing OK. ? ?Referring physician: ?Josetta Huddle, MD ?[address] ?[address] ?[place],  [place] [address] ? ?HISTORICAL INFORMATION:  ? ?Selected notes from the [facility] ?Referred by Dr. Marin Comment for eval of decreased VA OD.  ? ?CURRENT MEDICATIONS: ?No current outpatient medications on file. (Ophthalmic Drugs)  ? ?No current facility-administered medications for this visit. (Ophthalmic Drugs)  ? ?Current Outpatient Medications (Other)  ?Medication Sig  ? albuterol (PROVENTIL,VENTOLIN) 90 MCG/ACT inhaler Inhale 2 puffs into the lungs every 4 (four) hours as needed.  ? anastrozole (ARIMIDEX) 1 MG tablet Take 1 tablet (1 mg total) by mouth daily. Stop May 2023  ? arformoterol (BROVANA) 15 MCG/2ML NEBU Take 2 mLs (15 mcg total) by nebulization 2 (two) times daily.  ? betamethasone dipropionate 1.06 % cream 1 application  ? buPROPion (WELLBUTRIN SR) 150 MG 12 hr tablet Take 150 mg by mouth daily.   ? clobetasol ointment (TEMOVATE) 0.05 % as needed.  ? diphenhydrAMINE (BENADRYL) 25 MG tablet Take 25 mg by mouth as needed for sleep.  ? escitalopram (LEXAPRO) 10 MG tablet Take 1 tablet (10 mg total) by mouth daily.  ? ezetimibe (ZETIA) 10 MG tablet Take 1 tablet (10 mg total) by mouth daily.  ? furosemide (LASIX) 40 MG  tablet Take 1 tablet (40 mg total) by mouth daily.  ? hydrOXYzine (ATARAX/VISTARIL) 25 MG tablet Take 25 mg by mouth at bedtime.  ? imipramine (TOFRANIL) 50 MG tablet Take 50 mg by mouth at bedtime.  ? ipratropium (ATROVENT) 0.06 % nasal spray Place 2 sprays into the nose 4 (four) times daily.  ? ketoconazole (NIZORAL) 2 % cream Apply 1 application topically daily.  ? levocetirizine (XYZAL) 5 MG tablet Take 5 mg by mouth as needed for allergies.  ? levothyroxine (SYNTHROID, LEVOTHROID) 112 MCG tablet TAKE ONE TABLET BY MOUTH ONCE DAILY  ? meloxicam (MOBIC) 7.5 MG tablet Take 7.5 mg by mouth at bedtime.  ? mometasone (ELOCON) 0.1 % ointment Apply topically as needed.  ? nitroGLYCERIN (NITROSTAT) 0.4 MG SL tablet as needed.  ? omeprazole (PRILOSEC) 20 MG capsule Take 20 mg by mouth daily.  ? Potassium Chloride ER 20 MEQ TBCR 1 tablet with food  ? revefenacin (YUPELRI) 175 MCG/3ML nebulizer solution Take 3 mLs (175 mcg total) by nebulization daily.  ? rosuvastatin (CRESTOR) 20 MG tablet Take 1 tablet (20 mg total) by mouth daily.  ? tacrolimus (PROTOPIC) 0.1 % ointment Apply topically as needed.  ? Tiotropium Bromide-Olodaterol (STIOLTO RESPIMAT) 2.5-2.5 MCG/ACT AERS Inhale 2 puffs into the lungs daily.  ? Tiotropium Bromide-Olodaterol (STIOLTO RESPIMAT) 2.5-2.5 MCG/ACT AERS Inhale 2 puffs into the lungs daily.  ?  albuterol (VENTOLIN HFA) 108 (90 Base) MCG/ACT inhaler Inhale 2 puffs into the lungs every 4 (four) hours as needed. (Patient not taking: Reported on 03/08/2022)  ? ?No current facility-administered medications for this visit. (Other)  ? ?REVIEW OF SYSTEMS: ?ROS   ?Positive for: Neurological, Eyes, Respiratory ?Negative for: Constitutional, Gastrointestinal, Skin, Genitourinary, Musculoskeletal, HENT, Endocrine, Cardiovascular, Psychiatric, Allergic/Imm, Heme/Lymph ?Last edited by Theodore Demark, COA on 04/18/2022  1:45 PM.  ?  ? ?ALLERGIES ?Allergies  ?Allergen Reactions  ? Latex Other (See Comments)  ?   Other reaction(s): Other (See Comments) ?Tears skin.  ? Adhesive [Tape] Other (See Comments)  ?  (skin tears)  ?Tolerates PAPER TAPE  ? Codeine Nausea Only  ? Other Nausea Only  ?  Anesthesia--nausea   ? ?PAST MEDICAL HISTORY ?Past Medical History:  ?Diagnosis Date  ? Allergy   ? Breast cancer ([place])   ? Cataract   ? Colon polyps   ? Depression   ? Dyspnea   ? SEASONAL  ? Family history of breast cancer   ? Family history of colon cancer   ? GERD (gastroesophageal reflux disease)   ? History of radiation therapy 06/05/17-07/20/17  ? right chest wall 50.4 Gy in 28 fractions, right axillary nodal region 45 Gy in 25 fractions  ? Hyperlipidemia   ? Hypertension   ? Hypertensive retinopathy   ? Hypothyroidism   ? Macular degeneration   ? Peripheral neuropathy 06/18/2019  ? PONV (postoperative nausea and vomiting)   ? ?Past Surgical History:  ?Procedure Laterality Date  ? ABDOMINAL HYSTERECTOMY    ? KNEE SURGERY Right   ? MASTECTOMY W/ SENTINEL NODE BIOPSY Right 03/05/2017  ? Procedure: BILATERAL TOTAL MASTECTOMIES WITH RIGHT SENTINEL LYMPH NODE BIOPSIES;  Surgeon: Excell Seltzer, MD;  Location: [facility];  Service: General;  Laterality: Right;  ? SHOULDER SURGERY    ? BILAT  ? ?FAMILY HISTORY ?Family History  ?Problem Relation Age of Onset  ? Heart disease Mother 63  ?     stents  ? Colon cancer Maternal Grandfather 64  ? Colon cancer Maternal Aunt 53  ? Colon cancer Maternal Uncle   ?     dx in his 65s  ? Breast cancer Paternal Aunt 36  ? Breast cancer Cousin 44  ?     paternal first cousin  ? Lung cancer Paternal Grandfather   ? Colon cancer Maternal Uncle   ? Head & neck cancer Maternal Uncle   ?     oral cancer  ? ?SOCIAL HISTORY ?Social History  ? ?Tobacco Use  ? Smoking status: Former  ?  Packs/day: 1.00  ?  Years: 45.00  ?  Pack years: 45.00  ?  Types: Cigarettes  ?  Quit date: 02/22/2017  ?  Years since quitting: 5.1  ? Smokeless tobacco: Never  ?Vaping Use  ? Vaping Use: Never used  ?Substance Use Topics  ? Alcohol  use: No  ?  Alcohol/week: 0.0 standard drinks  ? Drug use: No  ?  ? ?  ?OPHTHALMIC EXAM: ?Base Eye Exam   ? ? Visual Acuity (Snellen - Linear)   ? ?   Right Left  ? Dist cc 20/40 -1 CF at 2'  ? Dist ph cc 20/40 +1 NI  ? ? Correction: Glasses  ? ?  ?  ? ? Tonometry (Tonopen, 1:42 PM)   ? ?   Right Left  ? Pressure 17 16  ? ?  ?  ? ? Pupils   ? ?  Dark Light Shape React APD  ? Right 3 2 Round Minimal None  ? Left 3 2 Round Minimal None  ? ?  ?  ? ? Visual Fields (Counting fingers)   ? ?   Left Right  ?  Full Full  ? ?  ?  ? ? Extraocular Movement   ? ?   Right Left  ?  Full, Ortho Full, Ortho  ? ?  ?  ? ? Neuro/Psych   ? ? Oriented x3: Yes  ? Mood/Affect: Normal  ? ?  ?  ? ? Dilation   ? ? Both eyes: 1.0% Mydriacyl, 2.5% Phenylephrine @ 1:42 PM  ? ?  ?  ? ?  ? ?Slit Lamp and Fundus Exam   ? ? Slit Lamp Exam   ? ?   Right Left  ? Lids/Lashes Dermatochalasis - upper lid Dermatochalasis - upper lid  ? Conjunctiva/Sclera nasal and temporal pinguecula nasal and temporal pinguecula  ? Cornea 2+ Punctate epithelial erosions, irregular epi, arcus, decreased TBUT 2+ Punctate epithelial erosions, irregular epi, arcus, decreased TBUT  ? Anterior Chamber deep, narrow temporal angle deep, narrow temporal angle  ? Iris Round and moderately dilated, mild anterior bowing Round and moderately dilated, mild anterior bowing  ? Lens 2-3+ Nuclear sclerosis with early brunescence, 2-3+ Cortical cataract 2-3+ Nuclear sclerosis with early brunescence, 2-3+ Cortical cataract  ? Anterior Vitreous Vitreous syneresis, Posterior vitreous detachment Vitreous syneresis, Posterior vitreous detachment  ? ?  ?  ? ? Fundus Exam   ? ?   Right Left  ? Disc Pink and Sharp, Compact, mild PPA Mild Pallor, Sharp rim, +elevation  ? C/D Ratio 0.0 0.1  ? Macula Blunted foveal reflex, +CNV with pigment clumping/ring, heme stably improved, shallow SRF stably improved, RPE mottling and clumping, Drusen Blunted foveal reflex, large area of GA with disciform scar  and subretinal fibrosis, central pigment clumping, no heme  ? Vessels attenuated, Tortuous attenuated, Tortuous  ? Periphery Attached, scattered reticular degeneration, paving stone degeneration Attached, scat

## 2022-04-19 ENCOUNTER — Encounter (INDEPENDENT_AMBULATORY_CARE_PROVIDER_SITE_OTHER): Payer: Self-pay | Admitting: Ophthalmology

## 2022-04-19 MED ORDER — BEVACIZUMAB CHEMO INJECTION 1.25MG/0.05ML SYRINGE FOR KALEIDOSCOPE
1.2500 mg | INTRAVITREAL | Status: AC | PRN
  Administered 2022-04-19: 1.25 mg via INTRAVITREAL

## 2022-05-02 DIAGNOSIS — H353132 Nonexudative age-related macular degeneration, bilateral, intermediate dry stage: Secondary | ICD-10-CM | POA: Diagnosis not present

## 2022-05-02 DIAGNOSIS — H31011 Macula scars of posterior pole (postinflammatory) (post-traumatic), right eye: Secondary | ICD-10-CM | POA: Diagnosis not present

## 2022-05-02 DIAGNOSIS — H35033 Hypertensive retinopathy, bilateral: Secondary | ICD-10-CM | POA: Diagnosis not present

## 2022-05-02 DIAGNOSIS — H25813 Combined forms of age-related cataract, bilateral: Secondary | ICD-10-CM | POA: Diagnosis not present

## 2022-05-17 NOTE — Progress Notes (Signed)
[facility] Note  05/30/2022     CHIEF COMPLAINT Patient presents for Retina Follow Up  HISTORY OF PRESENT ILLNESS: Kimberly Waters is a 76 y.o. female who presents to the clinic today for:  HPI     Retina Follow Up   Patient presents with  Wet AMD (IVA OD #15 (04.25.23)).  In right eye.  This started 6 months ago.  Duration of 6 weeks.  I, the attending physician,  performed the HPI with the patient and updated documentation appropriately.        Comments   Patient feels that the vision is the same since her last visit 6 weeks ago.      Last edited by Bernarda Caffey, MD on 05/30/2022 11:34 PM.    Pt states vision doing okay, not aware of any vision changes  Referring physician: Josetta Huddle, MD [address] [place],  [place] [address]  HISTORICAL INFORMATION:   Selected notes from the MEDICAL RECORD NUMBER Referred by Dr. Marin Comment for eval of decreased VA OD.   CURRENT MEDICATIONS: No current outpatient medications on file. (Ophthalmic Drugs)   No current facility-administered medications for this visit. (Ophthalmic Drugs)   Current Outpatient Medications (Other)  Medication Sig   albuterol (PROVENTIL,VENTOLIN) 90 MCG/ACT inhaler Inhale 2 puffs into the lungs every 4 (four) hours as needed.   albuterol (VENTOLIN HFA) 108 (90 Base) MCG/ACT inhaler Inhale 2 puffs into the lungs every 4 (four) hours as needed.   anastrozole (ARIMIDEX) 1 MG tablet Take 1 tablet (1 mg total) by mouth daily. Stop May 2023   arformoterol (BROVANA) 15 MCG/2ML NEBU Take 2 mLs (15 mcg total) by nebulization 2 (two) times daily.   betamethasone dipropionate 1.44 % cream 1 application   buPROPion (WELLBUTRIN SR) 150 MG 12 hr tablet Take 150 mg by mouth daily.    clobetasol ointment (TEMOVATE) 0.05 % as needed.   diphenhydrAMINE (BENADRYL) 25 MG tablet Take 25 mg by mouth as needed for sleep.   escitalopram (LEXAPRO) 10 MG tablet Take 1 tablet (10 mg total) by  mouth daily.   ezetimibe (ZETIA) 10 MG tablet Take 1 tablet (10 mg total) by mouth daily.   furosemide (LASIX) 40 MG tablet Take 1 tablet (40 mg total) by mouth daily.   hydrOXYzine (ATARAX/VISTARIL) 25 MG tablet Take 25 mg by mouth at bedtime.   imipramine (TOFRANIL) 50 MG tablet Take 50 mg by mouth at bedtime.   ipratropium (ATROVENT) 0.06 % nasal spray Place 2 sprays into the nose 4 (four) times daily.   ketoconazole (NIZORAL) 2 % cream Apply 1 application topically daily.   levocetirizine (XYZAL) 5 MG tablet Take 5 mg by mouth as needed for allergies.   levothyroxine (SYNTHROID, LEVOTHROID) 112 MCG tablet TAKE ONE TABLET BY MOUTH ONCE DAILY   meloxicam (MOBIC) 7.5 MG tablet Take 7.5 mg by mouth at bedtime.   mometasone (ELOCON) 0.1 % ointment Apply topically as needed.   nitroGLYCERIN (NITROSTAT) 0.4 MG SL tablet as needed.   omeprazole (PRILOSEC) 20 MG capsule Take 20 mg by mouth daily.   Potassium Chloride ER 20 MEQ TBCR 1 tablet with food   revefenacin (YUPELRI) 175 MCG/3ML nebulizer solution Take 3 mLs (175 mcg total) by nebulization daily.   rosuvastatin (CRESTOR) 20 MG tablet Take 1 tablet (20 mg total) by mouth daily.   tacrolimus (PROTOPIC) 0.1 % ointment Apply topically as needed.   Tiotropium Bromide-Olodaterol (STIOLTO RESPIMAT) 2.5-2.5 MCG/ACT AERS  Inhale 2 puffs into the lungs daily.   Tiotropium Bromide-Olodaterol (STIOLTO RESPIMAT) 2.5-2.5 MCG/ACT AERS Inhale 2 puffs into the lungs daily.   No current facility-administered medications for this visit. (Other)   REVIEW OF SYSTEMS: ROS   Positive for: Neurological, Eyes, Respiratory Negative for: Constitutional, Gastrointestinal, Skin, Genitourinary, Musculoskeletal, HENT, Endocrine, Cardiovascular, Psychiatric, Allergic/Imm, Heme/Lymph Last edited by Annie Paras, COT on 05/30/2022 12:47 PM.     ALLERGIES Allergies  Allergen Reactions   Latex Other (See Comments)    Other reaction(s): Other (See  Comments) Tears skin.   Adhesive [Tape] Other (See Comments)    (skin tears)  Tolerates PAPER TAPE   Codeine Nausea Only   Other Nausea Only    Anesthesia--nausea    PAST MEDICAL HISTORY Past Medical History:  Diagnosis Date   Allergy    Breast cancer ([place])    Cataract    Colon polyps    Depression    Dyspnea    SEASONAL   Family history of breast cancer    Family history of colon cancer    GERD (gastroesophageal reflux disease)    History of radiation therapy 06/05/17-07/20/17   right chest wall 50.4 Gy in 28 fractions, right axillary nodal region 45 Gy in 25 fractions   Hyperlipidemia    Hypertension    Hypertensive retinopathy    Hypothyroidism    Macular degeneration    Peripheral neuropathy 06/18/2019   PONV (postoperative nausea and vomiting)    Past Surgical History:  Procedure Laterality Date   ABDOMINAL HYSTERECTOMY     KNEE SURGERY Right    MASTECTOMY W/ SENTINEL NODE BIOPSY Right 03/05/2017   Procedure: BILATERAL TOTAL MASTECTOMIES WITH RIGHT SENTINEL LYMPH NODE BIOPSIES;  Surgeon: Excell Seltzer, MD;  Location: [facility];  Service: General;  Laterality: Right;   SHOULDER SURGERY     BILAT   FAMILY HISTORY Family History  Problem Relation Age of Onset   Heart disease Mother 21       stents   Colon cancer Maternal Grandfather 59   Colon cancer Maternal Aunt 65   Colon cancer Maternal Uncle        dx in his 17s   Breast cancer Paternal Aunt 73   Breast cancer Cousin 72       paternal first cousin   Lung cancer Paternal Grandfather    Colon cancer Maternal Uncle    Head & neck cancer Maternal Uncle        oral cancer   SOCIAL HISTORY Social History   Tobacco Use   Smoking status: Former    Packs/day: 1.00    Years: 45.00    Pack years: 45.00    Types: Cigarettes    Quit date: 02/22/2017    Years since quitting: 5.2   Smokeless tobacco: Never  Vaping Use   Vaping Use: Never used  Substance Use Topics   Alcohol use: No    Alcohol/week: 0.0  standard drinks   Drug use: No       OPHTHALMIC EXAM: Base Eye Exam     Visual Acuity (Snellen - Linear)       Right Left   Dist cc 20/40 CF at 3'   Dist ph cc NI NI    Correction: Glasses         Tonometry (Tonopen, 12:51 PM)       Right Left   Pressure 18 20         Pupils  Pupils Dark Light Shape React APD   Right PERRL 3 2 Round Brisk None   Left PERRL 3 2 Round Brisk None         Visual Fields       Left Right    Full Full         Extraocular Movement       Right Left    Full, Ortho Full, Ortho         Neuro/Psych     Oriented x3: Yes   Mood/Affect: Normal         Dilation     Both eyes: 1.0% Mydriacyl, 2.5% Phenylephrine @ 12:50 PM           Slit Lamp and Fundus Exam     Slit Lamp Exam       Right Left   Lids/Lashes Dermatochalasis - upper lid Dermatochalasis - upper lid   Conjunctiva/Sclera nasal and temporal pinguecula nasal and temporal pinguecula   Cornea 2+ Punctate epithelial erosions, irregular epi, arcus, decreased TBUT 2+ Punctate epithelial erosions, irregular epi, arcus, decreased TBUT   Anterior Chamber deep, narrow temporal angle deep, narrow temporal angle   Iris Round and moderately dilated, mild anterior bowing Round and moderately dilated, mild anterior bowing   Lens 2-3+ Nuclear sclerosis with early brunescence, 2-3+ Cortical cataract 2-3+ Nuclear sclerosis with early brunescence, 2-3+ Cortical cataract   Anterior Vitreous Vitreous syneresis, Posterior vitreous detachment Vitreous syneresis, Posterior vitreous detachment         Fundus Exam       Right Left   Disc Pink and Sharp, Compact, mild PPA Mild Pallor, Sharp rim, +elevation   C/D Ratio 0.0 0.1   Macula Blunted foveal reflex, +CNV with pigment clumping/ring, heme stably improved, shallow SRF stably improved, RPE mottling and clumping, Drusen Blunted foveal reflex, large area of GA with disciform scar and subretinal fibrosis, central pigment  clumping, no heme   Vessels attenuated, Tortuous attenuated, Tortuous   Periphery Attached, scattered reticular degeneration, paving stone degeneration Attached, scattered reticular degeneration, scattered paving stone degeneration           Refraction     Wearing Rx       Sphere Cylinder Axis   Right -6.50 +2.00 152   Left -5.25 +1.00 018    Type: SVL           IMAGING AND PROCEDURES  Imaging and Procedures for 05/30/2022  OCT, Retina - OU - Both Eyes       Right Eye Quality was good. Central Foveal Thickness: 345. Progression has been stable. Findings include abnormal foveal contour, subretinal hyper-reflective material, pigment epithelial detachment, choroidal neovascular membrane, no IRF, no SRF (Stable improvement in IRF/SRF overlying PED/SRHM).   Left Eye Quality was good. Central Foveal Thickness: 253. Progression has been stable. Findings include abnormal foveal contour, no IRF, no SRF, subretinal hyper-reflective material, outer retinal atrophy, lamellar hole (large central disciform scar with ORA, Foveal notch, trace cystic changes and temporal edema -- all stable).   Notes *Images captured and stored on drive  Diagnosis / Impression:  exu ARMD OU OD: stable improvement in IRF/SRF overlying PED/SRHM OS: large central disciform scar with ORA, Foveal notch, trace cystic changes and temporal edema -- all stable  Clinical management:  See below  Abbreviations: NFP - Normal foveal profile. CME - cystoid macular edema. PED - pigment epithelial detachment. IRF - intraretinal fluid. SRF - subretinal fluid. EZ - ellipsoid zone. ERM - epiretinal membrane. ORA -  outer retinal atrophy. ORT - outer retinal tubulation. SRHM - subretinal hyper-reflective material. IRHM - intraretinal hyper-reflective material      Intravitreal Injection, Pharmacologic Agent - OD - Right Eye       Time Out 05/30/2022. 1:08 PM. Confirmed correct patient, procedure, site, and patient  consented.   Anesthesia Topical anesthesia was used. Anesthetic medications included Lidocaine 2%, Proparacaine 0.5%.   Procedure Preparation included 5% betadine to ocular surface, eyelid speculum. A (32g) needle was used.   Injection: 1.25 mg Bevacizumab 1.'25mg'$ /0.51m   Route: Intravitreal, Site: Right Eye   NDC: [id number] Lot:: [id number] Expiration date: 07/09/2022   Post-op Post injection exam found visual acuity of at least counting fingers. The patient tolerated the procedure well. There were no complications. The patient received written and verbal post procedure care education. Post injection medications were not given.             ASSESSMENT/PLAN:   ICD-10-CM   1. Exudative age-related macular degeneration of right eye with active choroidal neovascularization (HCC)  H35.3211 OCT, Retina - OU - Both Eyes    Intravitreal Injection, Pharmacologic Agent - OD - Right Eye    Bevacizumab (AVASTIN) SOLN 1.25 mg    2. Exudative age-related macular degeneration of left eye with active choroidal neovascularization (HCC)  H35.3221 OCT, Retina - OU - Both Eyes    3. Essential hypertension  I10     4. Hypertensive retinopathy of both eyes  H35.033     5. Combined forms of age-related cataract of both eyes  H25.813      1. Exudative age related macular degeneration OD  - pt initially presented w/ 2 wk history of decreased vision OD  - history of intravitreal injections OS with Dr. RZadie Rhinein 2017 and earlier -- pt reports stopping visits after the development of a corneal abrasion from a lid speculum.  - s/p IVA OD #1 (12.21.21), #2 (01.24.22), #3 (2.21.22), #4 (03.22.22), #5 (4.26.22), #6 (5.31.22), #7 (7.12.22), #8 (8.16.22), #9 (9.20.22), #10 (10.25.22), #11 (12.6.22), #12 (01.10.23), #13 (02.14.23), #14 (03.21.23), #15 (04.25.23)  **history of increased SRF at 6 wk interval, noted on 12.6.22**  - OCT shows OD: stable improvement in SRF overlying PED/SRHM at 6 wks  - BCVA OD  20/40             - Recommend IVA OD #16 today, 06.06.23 w/ ext f/u to 8 wks  - RBA of procedure discussed, questions answered  - informed consent obtained and signed  - see procedure note  - f/u in 8 wks -- DFE/OCT, possible injection   2. Exudative age related macular degeneration, OS  - OS with history inactive disciform scar, but new focal IRH noted 5.31.22 -- stably resolved on exam today - s/p IVA OS #1 (05.31.22), #2 (07.12.22), #3 (8.15.22), #4 (9.20.22) for focal [employer] - history of intravitreal injections OS with Dr. RZadie Rhinein 2017 and earlier -- pt reports stopping visits after the development of a corneal abrasion from a lid speculum  - before being referred here, had not seen a retina specialist since 2017  - exam and OCT OS shows stable disciform scar -- no IRF/SRF  - BCVA CF OS - recommend holding IVA OS today - f/u 8 weeks, DFE, OCT  3,4. Hypertensive retinopathy OU - discussed importance of tight BP control - monitor  5. Mixed Cataract OU - The symptoms of cataract, surgical options, and treatments and risks were discussed with patient. - discussed diagnosis and progression -  approaching visual significance - had appt with Dr. Lucianne Lei, but is going to hold off on surgery for now -- concerns due to her functional monocularity   Ophthalmic Meds Ordered this visit:  Meds ordered this encounter  Medications   Bevacizumab (AVASTIN) SOLN 1.25 mg     Return in about 8 weeks (around 07/25/2022) for f/u exu ARMD OD, DFE, OCT.  There are no Patient Instructions on file for this visit.  This document serves as a record of services personally performed by Gardiner Sleeper, MD, PhD. It was created on their behalf by Bernarda Caffey, MD, an ophthalmic technician. The creation of this record is the provider's dictation and/or activities during the visit.    Electronically signed by: Bernarda Caffey, MD 05/17/22 11:36 PM   Gardiner Sleeper, M.D., Ph.D. Diseases & Surgery of the Retina and  Vitreous [facility]  I have reviewed the above documentation for accuracy and completeness, and I agree with the above. Gardiner Sleeper, M.D., Ph.D. 05/30/22 11:38 PM   Abbreviations: M myopia (nearsighted); A astigmatism; H hyperopia (farsighted); P presbyopia; Mrx spectacle prescription;  CTL contact lenses; OD right eye; OS left eye; OU both eyes  XT exotropia; ET esotropia; PEK punctate epithelial keratitis; PEE punctate epithelial erosions; DES dry eye syndrome; MGD meibomian gland dysfunction; ATs artificial tears; PFAT's preservative free artificial tears; [place] nuclear sclerotic cataract; PSC posterior subcapsular cataract; ERM epi-retinal membrane; PVD posterior vitreous detachment; RD retinal detachment; DM diabetes mellitus; DR diabetic retinopathy; NPDR non-proliferative diabetic retinopathy; PDR proliferative diabetic retinopathy; CSME clinically significant macular edema; DME diabetic macular edema; dbh dot blot hemorrhages; CWS cotton wool spot; POAG primary open angle glaucoma; C/D cup-to-disc ratio; HVF humphrey visual field; GVF goldmann visual field; OCT optical coherence tomography; IOP intraocular pressure; BRVO Branch retinal vein occlusion; CRVO central retinal vein occlusion; CRAO central retinal artery occlusion; BRAO branch retinal artery occlusion; RT retinal tear; SB scleral buckle; PPV pars plana vitrectomy; VH Vitreous hemorrhage; PRP panretinal laser photocoagulation; IVK intravitreal kenalog; VMT vitreomacular traction; MH Macular hole;  NVD neovascularization of the disc; NVE neovascularization elsewhere; AREDS age related eye disease study; ARMD age related macular degeneration; POAG primary open angle glaucoma; EBMD epithelial/anterior basement membrane dystrophy; ACIOL anterior chamber intraocular lens; IOL intraocular lens; PCIOL posterior chamber intraocular lens; Phaco/IOL phacoemulsification with intraocular lens placement; [place] photorefractive  keratectomy; LASIK laser assisted in situ keratomileusis; HTN hypertension; DM diabetes mellitus; COPD chronic obstructive pulmonary disease

## 2022-05-30 ENCOUNTER — Ambulatory Visit (INDEPENDENT_AMBULATORY_CARE_PROVIDER_SITE_OTHER): Payer: Medicare Other | Admitting: Ophthalmology

## 2022-05-30 ENCOUNTER — Encounter (INDEPENDENT_AMBULATORY_CARE_PROVIDER_SITE_OTHER): Payer: Self-pay | Admitting: Ophthalmology

## 2022-05-30 DIAGNOSIS — H35033 Hypertensive retinopathy, bilateral: Secondary | ICD-10-CM | POA: Diagnosis not present

## 2022-05-30 DIAGNOSIS — H353221 Exudative age-related macular degeneration, left eye, with active choroidal neovascularization: Secondary | ICD-10-CM

## 2022-05-30 DIAGNOSIS — H353231 Exudative age-related macular degeneration, bilateral, with active choroidal neovascularization: Secondary | ICD-10-CM

## 2022-05-30 DIAGNOSIS — H353211 Exudative age-related macular degeneration, right eye, with active choroidal neovascularization: Secondary | ICD-10-CM

## 2022-05-30 DIAGNOSIS — H25813 Combined forms of age-related cataract, bilateral: Secondary | ICD-10-CM | POA: Diagnosis not present

## 2022-05-30 DIAGNOSIS — I10 Essential (primary) hypertension: Secondary | ICD-10-CM

## 2022-05-30 MED ORDER — BEVACIZUMAB CHEMO INJECTION 1.25MG/0.05ML SYRINGE FOR KALEIDOSCOPE
1.2500 mg | INTRAVITREAL | Status: AC | PRN
  Administered 2022-05-30: 1.25 mg via INTRAVITREAL

## 2022-07-11 NOTE — Progress Notes (Signed)
[facility] Note  07/25/2022     CHIEF COMPLAINT Patient presents for Retina Follow Up  HISTORY OF PRESENT ILLNESS: Kimberly Waters is a 76 y.o. female who presents to the clinic today for:  HPI     Retina Follow Up   Patient presents with  Wet AMD.  In right eye.  This started 8 weeks ago.  I, the attending physician,  performed the HPI with the patient and updated documentation appropriately.        Comments   Patient here for 8 weeks retina follow up for exu ARMD OD. Patient states vision doing ok. No eye pain.       Last edited by Bernarda Caffey, MD on 07/25/2022  1:21 PM.    Pt states she has not noticed any changes in vision  Referring physician: Josetta Huddle, MD [address] [place],   [address]  HISTORICAL INFORMATION:   Selected notes from the MEDICAL RECORD NUMBER Referred by Dr. Marin Comment for eval of decreased VA OD.   CURRENT MEDICATIONS: No current outpatient medications on file. (Ophthalmic Drugs)   No current facility-administered medications for this visit. (Ophthalmic Drugs)   Current Outpatient Medications (Other)  Medication Sig   albuterol (PROVENTIL,VENTOLIN) 90 MCG/ACT inhaler Inhale 2 puffs into the lungs every 4 (four) hours as needed.   albuterol (VENTOLIN HFA) 108 (90 Base) MCG/ACT inhaler Inhale 2 puffs into the lungs every 4 (four) hours as needed.   anastrozole (ARIMIDEX) 1 MG tablet Take 1 tablet (1 mg total) by mouth daily. Stop May 2023   arformoterol (BROVANA) 15 MCG/2ML NEBU Take 2 mLs (15 mcg total) by nebulization 2 (two) times daily.   betamethasone dipropionate 8.18 % cream 1 application   buPROPion (WELLBUTRIN SR) 150 MG 12 hr tablet Take 150 mg by mouth daily.    clobetasol ointment (TEMOVATE) 0.05 % as needed.   diphenhydrAMINE (BENADRYL) 25 MG tablet Take 25 mg by mouth as needed for sleep.   escitalopram (LEXAPRO) 10 MG tablet Take 1 tablet (10 mg total) by mouth daily.   ezetimibe  (ZETIA) 10 MG tablet Take 1 tablet (10 mg total) by mouth daily.   furosemide (LASIX) 40 MG tablet Take 1 tablet (40 mg total) by mouth daily.   hydrOXYzine (ATARAX/VISTARIL) 25 MG tablet Take 25 mg by mouth at bedtime.   imipramine (TOFRANIL) 50 MG tablet Take 50 mg by mouth at bedtime.   ipratropium (ATROVENT) 0.06 % nasal spray Place 2 sprays into the nose 4 (four) times daily.   ketoconazole (NIZORAL) 2 % cream Apply 1 application topically daily.   levocetirizine (XYZAL) 5 MG tablet Take 5 mg by mouth as needed for allergies.   levothyroxine (SYNTHROID, LEVOTHROID) 112 MCG tablet TAKE ONE TABLET BY MOUTH ONCE DAILY   meloxicam (MOBIC) 7.5 MG tablet Take 7.5 mg by mouth at bedtime.   mometasone (ELOCON) 0.1 % ointment Apply topically as needed.   nitroGLYCERIN (NITROSTAT) 0.4 MG SL tablet as needed.   omeprazole (PRILOSEC) 20 MG capsule Take 20 mg by mouth daily.   Potassium Chloride ER 20 MEQ TBCR 1 tablet with food   revefenacin (YUPELRI) 175 MCG/3ML nebulizer solution Take 3 mLs (175 mcg total) by nebulization daily.   rosuvastatin (CRESTOR) 20 MG tablet Take 1 tablet (20 mg total) by mouth daily.   tacrolimus (PROTOPIC) 0.1 % ointment Apply topically as needed.   Tiotropium Bromide-Olodaterol (STIOLTO RESPIMAT) 2.5-2.5 MCG/ACT AERS Inhale 2 puffs  into the lungs daily.   Tiotropium Bromide-Olodaterol (STIOLTO RESPIMAT) 2.5-2.5 MCG/ACT AERS Inhale 2 puffs into the lungs daily.   No current facility-administered medications for this visit. (Other)   REVIEW OF SYSTEMS: ROS   Positive for: Neurological, Eyes, Respiratory Negative for: Constitutional, Gastrointestinal, Skin, Genitourinary, Musculoskeletal, HENT, Endocrine, Cardiovascular, Psychiatric, Allergic/Imm, Heme/Lymph Last edited by Theodore Demark, COA on 07/25/2022  1:19 PM.     ALLERGIES Allergies  Allergen Reactions   Latex Other (See Comments)    Other reaction(s): Other (See Comments) Tears skin.   Adhesive [Tape]  Other (See Comments)    (skin tears)  Tolerates PAPER TAPE   Codeine Nausea Only   Other Nausea Only    Anesthesia--nausea    PAST MEDICAL HISTORY Past Medical History:  Diagnosis Date   Allergy    Breast cancer ([place])    Cataract    Colon polyps    Depression    Dyspnea    SEASONAL   Family history of breast cancer    Family history of colon cancer    GERD (gastroesophageal reflux disease)    History of radiation therapy 06/05/17-07/20/17   right chest wall 50.4 Gy in 28 fractions, right axillary nodal region 45 Gy in 25 fractions   Hyperlipidemia    Hypertension    Hypertensive retinopathy    Hypothyroidism    Macular degeneration    Peripheral neuropathy 06/18/2019   PONV (postoperative nausea and vomiting)    Past Surgical History:  Procedure Laterality Date   ABDOMINAL HYSTERECTOMY     KNEE SURGERY Right    MASTECTOMY W/ SENTINEL NODE BIOPSY Right 03/05/2017   Procedure: BILATERAL TOTAL MASTECTOMIES WITH RIGHT SENTINEL LYMPH NODE BIOPSIES;  Surgeon: Excell Seltzer, MD;  Location: [facility] OR;  Service: General;  Laterality: Right;   SHOULDER SURGERY     BILAT   FAMILY HISTORY Family History  Problem Relation Age of Onset   Heart disease Mother 66       stents   Colon cancer Maternal Grandfather 59   Colon cancer Maternal Aunt 65   Colon cancer Maternal Uncle        dx in his 21s   Breast cancer Paternal Aunt 74   Breast cancer Cousin 66       paternal first cousin   Lung cancer Paternal Grandfather    Colon cancer Maternal Uncle    Head & neck cancer Maternal Uncle        oral cancer   SOCIAL HISTORY Social History   Tobacco Use   Smoking status: Former    Packs/day: 1.00    Years: 45.00    Total pack years: 45.00    Types: Cigarettes    Quit date: 02/22/2017    Years since quitting: 5.4   Smokeless tobacco: Never  Vaping Use   Vaping Use: Never used  Substance Use Topics   Alcohol use: No    Alcohol/week: 0.0 standard drinks of alcohol   Drug  use: No       OPHTHALMIC EXAM: Base Eye Exam     Visual Acuity (Snellen - Linear)       Right Left   Dist cc 20/40 CF at 3'   Dist ph cc NI     Correction: Glasses         Tonometry (Tonopen, 1:17 PM)       Right Left   Pressure 14 16         Pupils  Dark Light Shape React APD   Right 3 2 Round Brisk None   Left 3 2 Round Brisk None         Visual Fields (Counting fingers)       Left Right    Full Full         Extraocular Movement       Right Left    Full, Ortho Full, Ortho         Neuro/Psych     Oriented x3: Yes   Mood/Affect: Normal         Dilation     Both eyes: 1.0% Mydriacyl, 2.5% Phenylephrine @ 1:17 PM           Slit Lamp and Fundus Exam     Slit Lamp Exam       Right Left   Lids/Lashes Dermatochalasis - upper lid Dermatochalasis - upper lid   Conjunctiva/Sclera nasal and temporal pinguecula nasal and temporal pinguecula   Cornea 2+ Punctate epithelial erosions, irregular epi, arcus, decreased TBUT 2+ Punctate epithelial erosions, irregular epi, arcus, decreased TBUT   Anterior Chamber deep, narrow temporal angle deep, narrow temporal angle   Iris Round and moderately dilated, mild anterior bowing Round and moderately dilated, mild anterior bowing   Lens 2-3+ Nuclear sclerosis with early brunescence, 2-3+ Cortical cataract 2-3+ Nuclear sclerosis with early brunescence, 2-3+ Cortical cataract   Anterior Vitreous Vitreous syneresis, Posterior vitreous detachment Vitreous syneresis, Posterior vitreous detachment         Fundus Exam       Right Left   Disc Pink and Sharp, Compact, mild PPA Mild Pallor, Sharp rim, +elevation   C/D Ratio 0.0 0.1   Macula Blunted foveal reflex, +CNV with pigment clumping/ring, shallow SRF - re-developed, RPE mottling and clumping, [place], focal linear IRH centrally Blunted foveal reflex, large area of GA with disciform scar and subretinal fibrosis, central pigment clumping, no heme    Vessels attenuated, Tortuous attenuated, Tortuous   Periphery Attached, scattered reticular degeneration, paving stone degeneration Attached, scattered reticular degeneration, scattered paving stone degeneration           Refraction     Wearing Rx       Sphere Cylinder Axis   Right -6.50 +2.00 152   Left -5.25 +1.00 018    Type: SVL           IMAGING AND PROCEDURES  Imaging and Procedures for 07/25/2022  OCT, Retina - OU - Both Eyes       Right Eye Quality was good. Central Foveal Thickness: 400. Progression has worsened. Findings include no IRF, abnormal foveal contour, subretinal hyper-reflective material, choroidal neovascular membrane, pigment epithelial detachment, subretinal fluid (Interval re-development of SRF overlying PED).   Left Eye Quality was good. Central Foveal Thickness: 279. Progression has been stable. Findings include no IRF, no SRF, abnormal foveal contour, subretinal hyper-reflective material, lamellar hole, outer retinal atrophy (large central disciform scar with ORA, Foveal notch, trace cystic changes and temporal edema -- all stable).   Notes *Images captured and stored on drive  Diagnosis / Impression:  exu ARMD OU OD: Interval re-development of SRF overlying PED OS: large central disciform scar with ORA, Foveal notch, trace cystic changes and temporal edema -- all stable  Clinical management:  See below  Abbreviations: NFP - Normal foveal profile. CME - cystoid macular edema. PED - pigment epithelial detachment. IRF - intraretinal fluid. SRF - subretinal fluid. EZ - ellipsoid zone. ERM - epiretinal membrane. ORA - outer  retinal atrophy. ORT - outer retinal tubulation. SRHM - subretinal hyper-reflective material. IRHM - intraretinal hyper-reflective material      Intravitreal Injection, Pharmacologic Agent - OD - Right Eye       Time Out 07/25/2022. 1:30 PM. Confirmed correct patient, procedure, site, and patient consented.    Anesthesia Topical anesthesia was used. Anesthetic medications included Lidocaine 2%, Proparacaine 0.5%.   Procedure Preparation included 5% betadine to ocular surface, eyelid speculum. A (32g) needle was used.   Injection: 1.25 mg Bevacizumab 1.'25mg'$ /0.77m   Route: Intravitreal, Site: Right Eye   NDC: [id number] Lot:: [id number] Expiration date: 10/27/2022   Post-op Post injection exam found visual acuity of at least counting fingers. The patient tolerated the procedure well. There were no complications. The patient received written and verbal post procedure care education. Post injection medications were not given.            ASSESSMENT/PLAN:   ICD-10-CM   1. Exudative age-related macular degeneration of right eye with active choroidal neovascularization (HCC)  H35.3211 OCT, Retina - OU - Both Eyes    Intravitreal Injection, Pharmacologic Agent - OD - Right Eye    Bevacizumab (AVASTIN) SOLN 1.25 mg    2. Exudative age-related macular degeneration of left eye with active choroidal neovascularization ([place]  H35.3221     3. Essential hypertension  I10     4. Hypertensive retinopathy of both eyes  H35.033     5. Combined forms of age-related cataract of both eyes  H25.813      1. Exudative age related macular degeneration OD  - pt initially presented w/ 2 wk history of decreased vision OD  - history of intravitreal injections OS with Dr. RZadie Rhinein 2017 and earlier -- pt reports stopping visits after the development of a corneal abrasion from a lid speculum.  - s/p IVA OD #1 (12.21.21), #2 (01.24.22), #3 (2.21.22), #4 (03.22.22), #5 (4.26.22), #6 (5.31.22), #7 (7.12.22), #8 (8.16.22), #9 (9.20.22), #10 (10.25.22), #11 (12.6.22), #12 (01.10.23), #13 (02.14.23), #14 (03.21.23), #15 (04.25.23), #16 06.06.23)  **history of increased SRF at 6 wk interval, noted on 12.6.22**  ** history of increased SRF at 8 wk interval, noted on 08.01.23**  - OCT shows OD: Interval  re-development of SRF overlying PED at 8 wks  - BCVA OD 20/40 -- stable             - Recommend IVA OD #17 today, 08.01.23 w/ interval back to 6 wks  - RBA of procedure discussed, questions answered  - informed consent obtained and signed  - see procedure note  - f/u in 6 wks -- DFE/OCT, possible injection   2. Exudative age related macular degeneration, OS  - OS with history inactive disciform scar, but new focal IRH noted 5.31.22 -- stably resolved on exam today - s/p IVA OS #1 (05.31.22), #2 (07.12.22), #3 (8.15.22), #4 (9.20.22) [facility] - history of intravitreal injections OS with Dr. RZadie Rhinein 2017 and earlier -- pt reports stopping visits after the development of a corneal abrasion from a lid speculum  - before being referred here, had not seen a retina specialist since 2017  - exam and OCT OS shows stable disciform scar -- no IRF/SRF  - BCVA CF OS - recommend holding IVA OS today - f/u 8 weeks, DFE, OCT  3,4. Hypertensive retinopathy OU - discussed importance of tight BP control -  monitor  5. Mixed Cataract OU - The symptoms of cataract, surgical options, and treatments and  risks were discussed with patient. - discussed diagnosis and progression - approaching visual significance - had appt with Dr. Lucianne Lei, but is going to hold off on surgery for now -- concerns due to her functional monocularity   Ophthalmic Meds Ordered this visit:  Meds ordered this encounter  Medications   Bevacizumab (AVASTIN) SOLN 1.25 mg     Return in about 6 weeks (around 09/05/2022) for f/u exu ARMD OD, DFE, OCT.  There are no Patient Instructions on file for this visit.  This document serves as a record of services personally performed by Gardiner Sleeper, MD, PhD. It was created on their behalf by Renaldo Reel, [place] an ophthalmic technician. The creation of this record is the provider's dictation and/or activities during the visit.    Electronically signed by:  Renaldo Reel, COT   07/11/22 4:26 PM  This document serves as a record of services personally performed by Gardiner Sleeper, MD, PhD. It was created on their behalf by San Jetty. Owens Shark, OA an ophthalmic technician. The creation of this record is the provider's dictation and/or activities during the visit.    Electronically signed by: San Jetty. Owens Shark, [place] 08.01.2023 4:26 PM  Gardiner Sleeper, M.D., Ph.D. Diseases & Surgery of the Retina and Vitreous [facility]  I have reviewed the above documentation for accuracy and completeness, and I agree with the above. Gardiner Sleeper, M.D., Ph.D. 07/25/22 4:27 PM    Abbreviations: M myopia (nearsighted); A astigmatism; H hyperopia (farsighted); P presbyopia; Mrx spectacle prescription;  CTL contact lenses; OD right eye; OS left eye; OU both eyes  XT exotropia; ET esotropia; PEK punctate epithelial keratitis; PEE punctate epithelial erosions; DES dry eye syndrome; MGD meibomian gland dysfunction; ATs artificial tears; PFAT's preservative free artificial tears; [place] nuclear sclerotic cataract; PSC posterior subcapsular cataract; ERM epi-retinal membrane; PVD posterior vitreous detachment; RD retinal detachment; DM diabetes mellitus; DR diabetic retinopathy; NPDR non-proliferative diabetic retinopathy; PDR proliferative diabetic retinopathy; CSME clinically significant macular edema; DME diabetic macular edema; dbh dot blot hemorrhages; CWS cotton wool spot; POAG primary open angle glaucoma; C/D cup-to-disc ratio; HVF humphrey visual field; GVF goldmann visual field; OCT optical coherence tomography; IOP intraocular pressure; BRVO Branch retinal vein occlusion; CRVO central retinal vein occlusion; CRAO central retinal artery occlusion; BRAO branch retinal artery occlusion; RT retinal tear; SB scleral buckle; PPV pars plana vitrectomy; VH Vitreous hemorrhage; PRP panretinal laser photocoagulation; IVK intravitreal kenalog; VMT vitreomacular traction; MH Macular  hole;  NVD neovascularization of the disc; NVE neovascularization elsewhere; AREDS age related eye disease study; ARMD age related macular degeneration; POAG primary open angle glaucoma; EBMD epithelial/anterior basement membrane dystrophy; ACIOL anterior chamber intraocular lens; IOL intraocular lens; PCIOL posterior chamber intraocular lens; Phaco/IOL phacoemulsification with intraocular lens placement; [place] photorefractive keratectomy; LASIK laser assisted in situ keratomileusis; HTN hypertension; DM diabetes mellitus; COPD chronic obstructive pulmonary disease

## 2022-07-25 ENCOUNTER — Ambulatory Visit (INDEPENDENT_AMBULATORY_CARE_PROVIDER_SITE_OTHER): Payer: Medicare Other | Admitting: Ophthalmology

## 2022-07-25 ENCOUNTER — Encounter (INDEPENDENT_AMBULATORY_CARE_PROVIDER_SITE_OTHER): Payer: Self-pay | Admitting: Ophthalmology

## 2022-07-25 DIAGNOSIS — H353211 Exudative age-related macular degeneration, right eye, with active choroidal neovascularization: Secondary | ICD-10-CM

## 2022-07-25 DIAGNOSIS — H353231 Exudative age-related macular degeneration, bilateral, with active choroidal neovascularization: Secondary | ICD-10-CM

## 2022-07-25 DIAGNOSIS — I10 Essential (primary) hypertension: Secondary | ICD-10-CM | POA: Diagnosis not present

## 2022-07-25 DIAGNOSIS — H35033 Hypertensive retinopathy, bilateral: Secondary | ICD-10-CM

## 2022-07-25 DIAGNOSIS — H25813 Combined forms of age-related cataract, bilateral: Secondary | ICD-10-CM

## 2022-07-25 DIAGNOSIS — H353221 Exudative age-related macular degeneration, left eye, with active choroidal neovascularization: Secondary | ICD-10-CM

## 2022-07-25 MED ORDER — BEVACIZUMAB CHEMO INJECTION 1.25MG/0.05ML SYRINGE FOR KALEIDOSCOPE
1.2500 mg | INTRAVITREAL | Status: AC | PRN
  Administered 2022-07-25: 1.25 mg via INTRAVITREAL

## 2022-08-24 NOTE — Progress Notes (Signed)
[facility] Note  09/05/2022     CHIEF COMPLAINT Patient presents for Retina Follow Up  HISTORY OF PRESENT ILLNESS: Kimberly Waters is a 76 y.o. female who presents to the clinic today for:  HPI     Retina Follow Up   Patient presents with  Wet AMD.  In right eye.  Severity is moderate.  Duration of 6 weeks.  Since onset it is stable.  I, the attending physician,  performed the HPI with the patient and updated documentation appropriately.        Comments   Pt here for 6 wk ret f/u exu ARMD OD. Pt states VA is the same, no changes.       Last edited by Bernarda Caffey, MD on 09/05/2022  1:50 PM.      Referring physician: Josetta Huddle, MD [address] [place],  [place] [address]  HISTORICAL INFORMATION:   Selected notes from the MEDICAL RECORD NUMBER Referred by Dr. Marin Comment for eval of decreased VA OD.   CURRENT MEDICATIONS: No current outpatient medications on file. (Ophthalmic Drugs)   No current facility-administered medications for this visit. (Ophthalmic Drugs)   Current Outpatient Medications (Other)  Medication Sig   albuterol (PROVENTIL,VENTOLIN) 90 MCG/ACT inhaler Inhale 2 puffs into the lungs every 4 (four) hours as needed.   albuterol (VENTOLIN HFA) 108 (90 Base) MCG/ACT inhaler Inhale 2 puffs into the lungs every 4 (four) hours as needed.   anastrozole (ARIMIDEX) 1 MG tablet Take 1 tablet (1 mg total) by mouth daily. Stop May 2023   arformoterol (BROVANA) 15 MCG/2ML NEBU Take 2 mLs (15 mcg total) by nebulization 2 (two) times daily.   betamethasone dipropionate 5.63 % cream 1 application   buPROPion (WELLBUTRIN SR) 150 MG 12 hr tablet Take 150 mg by mouth daily.    clobetasol ointment (TEMOVATE) 0.05 % as needed.   diphenhydrAMINE (BENADRYL) 25 MG tablet Take 25 mg by mouth as needed for sleep.   escitalopram (LEXAPRO) 10 MG tablet Take 1 tablet (10 mg total) by mouth daily.   ezetimibe (ZETIA) 10 MG tablet Take 1 tablet  (10 mg total) by mouth daily.   furosemide (LASIX) 40 MG tablet Take 1 tablet (40 mg total) by mouth daily.   hydrOXYzine (ATARAX/VISTARIL) 25 MG tablet Take 25 mg by mouth at bedtime.   imipramine (TOFRANIL) 50 MG tablet Take 50 mg by mouth at bedtime.   ipratropium (ATROVENT) 0.06 % nasal spray Place 2 sprays into the nose 4 (four) times daily.   ketoconazole (NIZORAL) 2 % cream Apply 1 application topically daily.   levocetirizine (XYZAL) 5 MG tablet Take 5 mg by mouth as needed for allergies.   levothyroxine (SYNTHROID, LEVOTHROID) 112 MCG tablet TAKE ONE TABLET BY MOUTH ONCE DAILY   meloxicam (MOBIC) 7.5 MG tablet Take 7.5 mg by mouth at bedtime.   mometasone (ELOCON) 0.1 % ointment Apply topically as needed.   nitroGLYCERIN (NITROSTAT) 0.4 MG SL tablet as needed.   omeprazole (PRILOSEC) 20 MG capsule Take 20 mg by mouth daily.   Potassium Chloride ER 20 MEQ TBCR 1 tablet with food   revefenacin (YUPELRI) 175 MCG/3ML nebulizer solution Take 3 mLs (175 mcg total) by nebulization daily.   rosuvastatin (CRESTOR) 20 MG tablet Take 1 tablet (20 mg total) by mouth daily.   tacrolimus (PROTOPIC) 0.1 % ointment Apply topically as needed.   Tiotropium Bromide-Olodaterol (STIOLTO RESPIMAT) 2.5-2.5 MCG/ACT AERS Inhale 2 puffs into the  lungs daily.   Tiotropium Bromide-Olodaterol (STIOLTO RESPIMAT) 2.5-2.5 MCG/ACT AERS Inhale 2 puffs into the lungs daily.   No current facility-administered medications for this visit. (Other)   REVIEW OF SYSTEMS: ROS   Positive for: Neurological, Eyes, Respiratory Negative for: Constitutional, Gastrointestinal, Skin, Genitourinary, Musculoskeletal, HENT, Endocrine, Cardiovascular, Psychiatric, Allergic/Imm, Heme/Lymph Last edited by Kingsley Spittle, COT on 09/05/2022 12:52 PM.      ALLERGIES Allergies  Allergen Reactions   Latex Other (See Comments)    Other reaction(s): Other (See Comments) Tears skin.   Adhesive [Tape] Other (See Comments)    (skin  tears)  Tolerates PAPER TAPE   Codeine Nausea Only   Other Nausea Only    Anesthesia--nausea    PAST MEDICAL HISTORY Past Medical History:  Diagnosis Date   Allergy    Breast cancer ([place])    Cataract    Colon polyps    Depression    Dyspnea    SEASONAL   Family history of breast cancer    Family history of colon cancer    GERD (gastroesophageal reflux disease)    History of radiation therapy 06/05/17-07/20/17   right chest wall 50.4 Gy in 28 fractions, right axillary nodal region 45 Gy in 25 fractions   Hyperlipidemia    Hypertension    Hypertensive retinopathy    Hypothyroidism    Macular degeneration    Peripheral neuropathy 06/18/2019   PONV (postoperative nausea and vomiting)    Past Surgical History:  Procedure Laterality Date   ABDOMINAL HYSTERECTOMY     KNEE SURGERY Right    MASTECTOMY W/ SENTINEL NODE BIOPSY Right 03/05/2017   Procedure: BILATERAL TOTAL MASTECTOMIES WITH RIGHT SENTINEL LYMPH NODE BIOPSIES;  Surgeon: Excell Seltzer, MD;  Location: [facility] OR;  Service: General;  Laterality: Right;   SHOULDER SURGERY     BILAT   FAMILY HISTORY Family History  Problem Relation Age of Onset   Heart disease Mother 61       stents   Colon cancer Maternal Grandfather 59   Colon cancer Maternal Aunt 65   Colon cancer Maternal Uncle        dx in his 25s   Breast cancer Paternal Aunt 68   Breast cancer Cousin 70       paternal first cousin   Lung cancer Paternal Grandfather    Colon cancer Maternal Uncle    Head & neck cancer Maternal Uncle        oral cancer   SOCIAL HISTORY Social History   Tobacco Use   Smoking status: Former    Packs/day: 1.00    Years: 45.00    Total pack years: 45.00    Types: Cigarettes    Quit date: 02/22/2017    Years since quitting: 5.5   Smokeless tobacco: Never  Vaping Use   Vaping Use: Never used  Substance Use Topics   Alcohol use: No    Alcohol/week: 0.0 standard drinks of alcohol   Drug use: No       OPHTHALMIC  EXAM: Base Eye Exam     Visual Acuity (Snellen - Linear)       Right Left   Dist cc 20/60 -2 CF at 3'   Dist ph cc 20/40 NI    Correction: Glasses         Tonometry (Tonopen, 12:59 PM)       Right Left   Pressure 13 16         Pupils  Dark Light Shape React APD   Right 3 2 Round Brisk None   Left 3 2 Round Brisk None         Visual Fields (Counting fingers)       Left Right    Full Full         Extraocular Movement       Right Left    Full, Ortho Full, Ortho         Neuro/Psych     Oriented x3: Yes   Mood/Affect: Normal         Dilation     Both eyes: 1.0% Mydriacyl, 2.5% Phenylephrine @ 1:00 PM           Slit Lamp and Fundus Exam     Slit Lamp Exam       Right Left   Lids/Lashes Dermatochalasis - upper lid Dermatochalasis - upper lid   Conjunctiva/Sclera nasal and temporal pinguecula nasal and temporal pinguecula   Cornea 2+ Punctate epithelial erosions, irregular epi, arcus, decreased TBUT 2+ Punctate epithelial erosions, irregular epi, arcus, decreased TBUT   Anterior Chamber deep, narrow temporal angle deep, narrow temporal angle   Iris Round and moderately dilated, mild anterior bowing Round and moderately dilated, mild anterior bowing   Lens 2-3+ Nuclear sclerosis with early brunescence, 2-3+ Cortical cataract 2-3+ Nuclear sclerosis with early brunescence, 2-3+ Cortical cataract   Anterior Vitreous Vitreous syneresis, Posterior vitreous detachment Vitreous syneresis, Posterior vitreous detachment         Fundus Exam       Right Left   Disc Pink and Sharp, Compact, mild PPA Mild Pallor, Sharp rim, +elevation   C/D Ratio 0.0 0.1   Macula Blunted foveal reflex, +CNV with pigment clumping/ring, shallow SRF - improved, RPE mottling and clumping, [place], focal linear IRH centrally -- improving, now punctate Blunted foveal reflex, large area of GA with disciform scar and subretinal fibrosis, central pigment clumping, no heme    Vessels attenuated, Tortuous attenuated, Tortuous   Periphery Attached, scattered reticular degeneration, paving stone degeneration Attached, scattered reticular degeneration, scattered paving stone degeneration           Refraction     Wearing Rx       Sphere Cylinder Axis   Right -6.50 +2.00 152   Left -5.25 +1.00 018    Type: SVL           IMAGING AND PROCEDURES  Imaging and Procedures for 09/05/2022  OCT, Retina - OU - Both Eyes       Right Eye Quality was good. Central Foveal Thickness: 324. Progression has improved. Findings include no IRF, no SRF, abnormal foveal contour, subretinal hyper-reflective material, choroidal neovascular membrane, pigment epithelial detachment (Interval improvement in SRF overlying stable PED).   Left Eye Quality was good. Central Foveal Thickness: 279. Progression has been stable. Findings include no IRF, no SRF, abnormal foveal contour, subretinal hyper-reflective material, lamellar hole, outer retinal atrophy (large central disciform scar with ORA, Foveal notch, trace cystic changes and temporal edema -- all stable).   Notes *Images captured and stored on drive  Diagnosis / Impression:  exu ARMD OU OD: Interval improvement in SRF overlying PED OS: large central disciform scar with ORA, Foveal notch, trace cystic changes and temporal edema -- all stable  Clinical management:  See below  Abbreviations: NFP - Normal foveal profile. CME - cystoid macular edema. PED - pigment epithelial detachment. IRF - intraretinal fluid. SRF - subretinal fluid. EZ - ellipsoid zone. ERM -  epiretinal membrane. ORA - outer retinal atrophy. ORT - outer retinal tubulation. SRHM - subretinal hyper-reflective material. IRHM - intraretinal hyper-reflective material      Intravitreal Injection, Pharmacologic Agent - OD - Right Eye       Time Out 09/05/2022. 1:23 PM. Confirmed correct patient, procedure, site, and patient consented.   Anesthesia Topical  anesthesia was used. Anesthetic medications included Lidocaine 2%, Proparacaine 0.5%.   Procedure Preparation included 5% betadine to ocular surface, eyelid speculum. A (32g) needle was used.   Injection: 1.25 mg Bevacizumab 1.74m/0.05ml   Route: Intravitreal, Site: Right Eye   NDC: 50242-060-01, Lot:: [id number] Expiration date: 10/17/2022   Post-op Post injection exam found visual acuity of at least counting fingers. The patient tolerated the procedure well. There were no complications. The patient received written and verbal post procedure care education. Post injection medications were not given.            ASSESSMENT/PLAN:   ICD-10-CM   1. Exudative age-related macular degeneration of right eye with active choroidal neovascularization (HCC)  H35.3211 OCT, Retina - OU - Both Eyes    Intravitreal Injection, Pharmacologic Agent - OD - Right Eye    Bevacizumab (AVASTIN) SOLN 1.25 mg    2. Exudative age-related macular degeneration of left eye with active choroidal neovascularization ([place]  H35.3221     3. Essential hypertension  I10     4. Hypertensive retinopathy of both eyes  H35.033     5. Combined forms of age-related cataract of both eyes  H25.813      1. Exudative age related macular degeneration OD  - pt initially presented w/ 2 wk history of decreased vision OD  - history of intravitreal injections OS with Dr. RZadie Rhinein 2017 and earlier -- pt reports stopping visits after the development of a corneal abrasion from a lid speculum.  - s/p IVA OD #1 (12.21.21), #2 (01.24.22), #3 (2.21.22), #4 (03.22.22), #5 (4.26.22), #6 (5.31.22), #7 (7.12.22), #8 (8.16.22), #9 (9.20.22), #10 (10.25.22), #11 (12.6.22), #12 (01.10.23), #13 (02.14.23), #14 (03.21.23), #15 (04.25.23), #16 06.06.23), #17 (08.01.23)  **history of increased SRF at 6 wk interval, noted on 12.6.22**  **history of increased SRF at 8 wk interval, noted on 08.01.23**  - OCT shows OD: Interval improvement in SRF  overlying stable PED at 6 wks  - BCVA OD 20/40 -- stable             - Recommend IVA OD #18 today, 09.12.23 w/ f/u extended to 7 wks  - RBA of procedure discussed, questions answered  - informed consent obtained and signed  - see procedure note  - f/u in 7 wks -- DFE/OCT, possible injection   2. Exudative age related macular degeneration, OS  - OS with history inactive disciform scar, but new focal IRH noted 5.31.22 -- stably resolved on exam today - s/p IVA OS #1 (05.31.22), #2 (07.12.22), #3 (8.15.22), [facility]4 (9.20.22) [facility] - history of intravitreal injections OS with Dr. RZadie Rhinein 2017 and earlier -- pt reports stopping visits after the development of a corneal abrasion from a lid speculum  - before being referred here, had not seen a retina specialist since 2017  - exam and OCT OS shows stable disciform scar -- no IRF/SRF  - BCVA CF OS - recommend holding IVA OS today - f/u 7 weeks, DFE, OCT  3,4. Hypertensive retinopathy OU - discussed importance of tight BP control - monitor  5. Mixed Cataract OU - The symptoms of  cataract, surgical options, and treatments and risks were discussed with patient. - discussed diagnosis and progression - approaching visual significance - had appt with Dr. Lucianne Lei, but is going to hold off on surgery for now -- concerns due to her functional monocularity   Ophthalmic Meds Ordered this visit:  Meds ordered this encounter  Medications   Bevacizumab (AVASTIN) SOLN 1.25 mg     Return in about 7 weeks (around 10/24/2022) for f/u exu ARMD OU, DFE, OCT.  There are no Patient Instructions on file for this visit.  This document serves as a record of services personally performed by Gardiner Sleeper, MD, PhD. It was created on their behalf by Renaldo Reel, [place] an ophthalmic technician. The creation of this record is the provider's dictation and/or activities during the visit.    Electronically signed by:  Renaldo Reel, COT  08/24/22 1:52  PM  This document serves as a record of services personally performed by Gardiner Sleeper, MD, PhD. It was created on their behalf by San Jetty. Owens Shark, OA an ophthalmic technician. The creation of this record is the provider's dictation and/or activities during the visit.    Electronically signed by: San Jetty. Owens Shark, [place] 09.12.2023 1:52 PM  Gardiner Sleeper, M.D., Ph.D. Diseases & Surgery of the Retina and Vitreous [facility]  I have reviewed the above documentation for accuracy and completeness, and I agree with the above. Gardiner Sleeper, M.D., Ph.D. 09/05/22 1:52 PM  Abbreviations: M myopia (nearsighted); A astigmatism; H hyperopia (farsighted); P presbyopia; Mrx spectacle prescription;  CTL contact lenses; OD right eye; OS left eye; OU both eyes  XT exotropia; ET esotropia; PEK punctate epithelial keratitis; PEE punctate epithelial erosions; DES dry eye syndrome; MGD meibomian gland dysfunction; ATs artificial tears; PFAT's preservative free artificial tears; [place] nuclear sclerotic cataract; PSC posterior subcapsular cataract; ERM epi-retinal membrane; PVD posterior vitreous detachment; RD retinal detachment; DM diabetes mellitus; DR diabetic retinopathy; NPDR non-proliferative diabetic retinopathy; PDR proliferative diabetic retinopathy; CSME clinically significant macular edema; DME diabetic macular edema; dbh dot blot hemorrhages; CWS cotton wool spot; POAG primary open angle glaucoma; C/D cup-to-disc ratio; HVF humphrey visual field; GVF goldmann visual field; OCT optical coherence tomography; IOP intraocular pressure; BRVO Branch retinal vein occlusion; CRVO central retinal vein occlusion; CRAO central retinal artery occlusion; BRAO branch retinal artery occlusion; RT retinal tear; SB scleral buckle; PPV pars plana vitrectomy; VH Vitreous hemorrhage; PRP panretinal laser photocoagulation; IVK intravitreal kenalog; VMT vitreomacular traction; MH Macular hole;  NVD  neovascularization of the disc; NVE neovascularization elsewhere; AREDS age related eye disease study; ARMD age related macular degeneration; POAG primary open angle glaucoma; EBMD epithelial/anterior basement membrane dystrophy; ACIOL anterior chamber intraocular lens; IOL intraocular lens; PCIOL posterior chamber intraocular lens; Phaco/IOL phacoemulsification with intraocular lens placement; [place] photorefractive keratectomy; LASIK laser assisted in situ keratomileusis; HTN hypertension; DM diabetes mellitus; COPD chronic obstructive pulmonary disease

## 2022-08-29 DIAGNOSIS — F322 Major depressive disorder, single episode, severe without psychotic features: Secondary | ICD-10-CM | POA: Diagnosis not present

## 2022-08-29 DIAGNOSIS — J439 Emphysema, unspecified: Secondary | ICD-10-CM | POA: Diagnosis not present

## 2022-08-29 DIAGNOSIS — H3530 Unspecified macular degeneration: Secondary | ICD-10-CM | POA: Diagnosis not present

## 2022-08-29 DIAGNOSIS — I10 Essential (primary) hypertension: Secondary | ICD-10-CM | POA: Diagnosis not present

## 2022-08-29 DIAGNOSIS — H9190 Unspecified hearing loss, unspecified ear: Secondary | ICD-10-CM | POA: Diagnosis not present

## 2022-08-29 DIAGNOSIS — I509 Heart failure, unspecified: Secondary | ICD-10-CM | POA: Diagnosis not present

## 2022-08-29 DIAGNOSIS — E039 Hypothyroidism, unspecified: Secondary | ICD-10-CM | POA: Diagnosis not present

## 2022-08-29 DIAGNOSIS — Z23 Encounter for immunization: Secondary | ICD-10-CM | POA: Diagnosis not present

## 2022-09-05 ENCOUNTER — Encounter (INDEPENDENT_AMBULATORY_CARE_PROVIDER_SITE_OTHER): Payer: Self-pay | Admitting: Ophthalmology

## 2022-09-05 ENCOUNTER — Ambulatory Visit (INDEPENDENT_AMBULATORY_CARE_PROVIDER_SITE_OTHER): Payer: Medicare Other | Admitting: Ophthalmology

## 2022-09-05 DIAGNOSIS — H353211 Exudative age-related macular degeneration, right eye, with active choroidal neovascularization: Secondary | ICD-10-CM

## 2022-09-05 DIAGNOSIS — H35033 Hypertensive retinopathy, bilateral: Secondary | ICD-10-CM

## 2022-09-05 DIAGNOSIS — I10 Essential (primary) hypertension: Secondary | ICD-10-CM

## 2022-09-05 DIAGNOSIS — H25813 Combined forms of age-related cataract, bilateral: Secondary | ICD-10-CM | POA: Diagnosis not present

## 2022-09-05 DIAGNOSIS — H353221 Exudative age-related macular degeneration, left eye, with active choroidal neovascularization: Secondary | ICD-10-CM

## 2022-09-05 DIAGNOSIS — H353231 Exudative age-related macular degeneration, bilateral, with active choroidal neovascularization: Secondary | ICD-10-CM | POA: Diagnosis not present

## 2022-09-05 MED ORDER — BEVACIZUMAB CHEMO INJECTION 1.25MG/0.05ML SYRINGE FOR KALEIDOSCOPE
1.2500 mg | INTRAVITREAL | Status: AC | PRN
  Administered 2022-09-05: 1.25 mg via INTRAVITREAL

## 2022-10-10 NOTE — Progress Notes (Signed)
[facility] Note  10/24/2022     CHIEF COMPLAINT Patient presents for Retina Follow Up  HISTORY OF PRESENT ILLNESS: Kimberly Waters is a 76 y.o. female who presents to the clinic today for:  HPI     Retina Follow Up   Patient presents with  Wet AMD.  In both eyes.  This started 7 weeks ago.  I, the attending physician,  performed the HPI with the patient and updated documentation appropriately.        Comments   Patient here for 7 weeks retina follow up for exu ARMD OU. Patient states vision doing ok. No eye pain.       Last edited by Bernarda Caffey, MD on 10/24/2022  5:37 PM.     Pt states vision is stable  Referring physician: Josetta Huddle, MD [address] [place],  [place] [address]  HISTORICAL INFORMATION:   Selected notes from the MEDICAL RECORD NUMBER Referred by Dr. Marin Comment for eval of decreased VA OD.   CURRENT MEDICATIONS: No current outpatient medications on file. (Ophthalmic Drugs)   No current facility-administered medications for this visit. (Ophthalmic Drugs)   Current Outpatient Medications (Other)  Medication Sig   albuterol (PROVENTIL,VENTOLIN) 90 MCG/ACT inhaler Inhale 2 puffs into the lungs every 4 (four) hours as needed.   albuterol (VENTOLIN HFA) 108 (90 Base) MCG/ACT inhaler Inhale 2 puffs into the lungs every 4 (four) hours as needed.   anastrozole (ARIMIDEX) 1 MG tablet Take 1 tablet (1 mg total) by mouth daily. Stop May 2023   arformoterol (BROVANA) 15 MCG/2ML NEBU Take 2 mLs (15 mcg total) by nebulization 2 (two) times daily.   betamethasone dipropionate 1.60 % cream 1 application   buPROPion (WELLBUTRIN SR) 150 MG 12 hr tablet Take 150 mg by mouth daily.    clobetasol ointment (TEMOVATE) 0.05 % as needed.   diphenhydrAMINE (BENADRYL) 25 MG tablet Take 25 mg by mouth as needed for sleep.   escitalopram (LEXAPRO) 10 MG tablet Take 1 tablet (10 mg total) by mouth daily.   ezetimibe (ZETIA) 10 MG tablet  Take 1 tablet (10 mg total) by mouth daily.   furosemide (LASIX) 40 MG tablet Take 1 tablet (40 mg total) by mouth daily.   hydrOXYzine (ATARAX/VISTARIL) 25 MG tablet Take 25 mg by mouth at bedtime.   imipramine (TOFRANIL) 50 MG tablet Take 50 mg by mouth at bedtime.   ipratropium (ATROVENT) 0.06 % nasal spray Place 2 sprays into the nose 4 (four) times daily.   ketoconazole (NIZORAL) 2 % cream Apply 1 application topically daily.   levocetirizine (XYZAL) 5 MG tablet Take 5 mg by mouth as needed for allergies.   levothyroxine (SYNTHROID, LEVOTHROID) 112 MCG tablet TAKE ONE TABLET BY MOUTH ONCE DAILY   meloxicam (MOBIC) 7.5 MG tablet Take 7.5 mg by mouth at bedtime.   mometasone (ELOCON) 0.1 % ointment Apply topically as needed.   nitroGLYCERIN (NITROSTAT) 0.4 MG SL tablet as needed.   omeprazole (PRILOSEC) 20 MG capsule Take 20 mg by mouth daily.   Potassium Chloride ER 20 MEQ TBCR 1 tablet with food   revefenacin (YUPELRI) 175 MCG/3ML nebulizer solution Take 3 mLs (175 mcg total) by nebulization daily.   rosuvastatin (CRESTOR) 20 MG tablet Take 1 tablet (20 mg total) by mouth daily.   tacrolimus (PROTOPIC) 0.1 % ointment Apply topically as needed.   Tiotropium Bromide-Olodaterol (STIOLTO RESPIMAT) 2.5-2.5 MCG/ACT AERS Inhale 2 puffs into the lungs daily.  Tiotropium Bromide-Olodaterol (STIOLTO RESPIMAT) 2.5-2.5 MCG/ACT AERS Inhale 2 puffs into the lungs daily.   No current facility-administered medications for this visit. (Other)   REVIEW OF SYSTEMS: ROS   Positive for: Neurological, Eyes, Respiratory Negative for: Constitutional, Gastrointestinal, Skin, Genitourinary, Musculoskeletal, HENT, Endocrine, Cardiovascular, Psychiatric, Allergic/Imm, Heme/Lymph Last edited by Theodore Demark, COA on 10/24/2022 12:49 PM.     ALLERGIES Allergies  Allergen Reactions   Latex Other (See Comments)    Other reaction(s): Other (See Comments) Tears skin.   Adhesive [Tape] Other (See Comments)     (skin tears)  Tolerates PAPER TAPE   Codeine Nausea Only   Other Nausea Only    Anesthesia--nausea    PAST MEDICAL HISTORY Past Medical History:  Diagnosis Date   Allergy    Breast cancer ([place])    Cataract    Colon polyps    Depression    Dyspnea    SEASONAL   Family history of breast cancer    Family history of colon cancer    GERD (gastroesophageal reflux disease)    History of radiation therapy 06/05/17-07/20/17   right chest wall 50.4 Gy in 28 fractions, right axillary nodal region 45 Gy in 25 fractions   Hyperlipidemia    Hypertension    Hypertensive retinopathy    Hypothyroidism    Macular degeneration    Peripheral neuropathy 06/18/2019   PONV (postoperative nausea and vomiting)    Past Surgical History:  Procedure Laterality Date   ABDOMINAL HYSTERECTOMY     KNEE SURGERY Right    MASTECTOMY W/ SENTINEL NODE BIOPSY Right 03/05/2017   Procedure: BILATERAL TOTAL MASTECTOMIES WITH RIGHT SENTINEL LYMPH NODE BIOPSIES;  Surgeon: Excell Seltzer, MD;  Location: [facility] OR;  Service: General;  Laterality: Right;   SHOULDER SURGERY     BILAT   FAMILY HISTORY Family History  Problem Relation Age of Onset   Heart disease Mother 41       stents   Colon cancer Maternal Grandfather 59   Colon cancer Maternal Aunt 65   Colon cancer Maternal Uncle        dx in his 50s   Breast cancer Paternal Aunt 41   Breast cancer Cousin 84       paternal first cousin   Lung cancer Paternal Grandfather    Colon cancer Maternal Uncle    Head & neck cancer Maternal Uncle        oral cancer   SOCIAL HISTORY Social History   Tobacco Use   Smoking status: Former    Packs/day: 1.00    Years: 45.00    Total pack years: 45.00    Types: Cigarettes    Quit date: 02/22/2017    Years since quitting: 5.6   Smokeless tobacco: Never  Vaping Use   Vaping Use: Never used  Substance Use Topics   Alcohol use: No    Alcohol/week: 0.0 standard drinks of alcohol   Drug use: No        OPHTHALMIC EXAM: Base Eye Exam     Visual Acuity (Snellen - Linear)       Right Left   Dist cc 20/50 -2 CF at 3'   Dist ph cc 20/40     Correction: Glasses         Tonometry (Tonopen, 12:48 PM)       Right Left   Pressure 14 15         Pupils       Dark Light Shape React APD  Right 3 2 Round Brisk None   Left 3 2 Round Brisk None         Visual Fields (Counting fingers)       Left Right    Full Full         Extraocular Movement       Right Left    Full, Ortho Full, Ortho         Neuro/Psych     Oriented x3: Yes   Mood/Affect: Normal         Dilation     Both eyes: 1.0% Mydriacyl, 2.5% Phenylephrine @ 12:47 PM           Slit Lamp and Fundus Exam     Slit Lamp Exam       Right Left   Lids/Lashes Dermatochalasis - upper lid Dermatochalasis - upper lid   Conjunctiva/Sclera nasal and temporal pinguecula nasal and temporal pinguecula   Cornea 2+ Punctate epithelial erosions, irregular epi, arcus, decreased TBUT 2+ Punctate epithelial erosions, irregular epi, arcus, decreased TBUT   Anterior Chamber deep, narrow temporal angle deep, narrow temporal angle   Iris Round and moderately dilated, mild anterior bowing Round and moderately dilated, mild anterior bowing   Lens 2-3+ Nuclear sclerosis with early brunescence, 2-3+ Cortical cataract 2-3+ Nuclear sclerosis with early brunescence, 2-3+ Cortical cataract   Anterior Vitreous Vitreous syneresis, Posterior vitreous detachment Vitreous syneresis, Posterior vitreous detachment         Fundus Exam       Right Left   Disc Pink and Sharp, Compact, mild PPA Mild Pallor, Sharp rim, +elevation   C/D Ratio 0.0 0.1   Macula Blunted foveal reflex, +CNV with pigment clumping/ring, shallow SRF - stably improved, RPE mottling and clumping, [place], focal linear IRH centrally -- improved -- almost resolved Blunted foveal reflex, large area of GA with disciform scar and subretinal fibrosis, central  pigment clumping, no heme   Vessels attenuated, Tortuous attenuated, Tortuous   Periphery Attached, scattered reticular degeneration, paving stone degeneration Attached, scattered reticular degeneration, scattered paving stone degeneration           Refraction     Wearing Rx       Sphere Cylinder Axis   Right -6.50 +2.00 152   Left -5.25 +1.00 018    Type: SVL           IMAGING AND PROCEDURES  Imaging and Procedures for 10/24/2022  OCT, Retina - OU - Both Eyes       Right Eye Quality was good. Central Foveal Thickness: 314. Progression has been stable. Findings include no IRF, no SRF, abnormal foveal contour, subretinal hyper-reflective material, choroidal neovascular membrane, pigment epithelial detachment (Stable improvement in SRF overlying stable PED).   Left Eye Quality was good. Central Foveal Thickness: 273. Progression has been stable. Findings include no IRF, no SRF, abnormal foveal contour, subretinal hyper-reflective material, lamellar hole, outer retinal atrophy (large central disciform scar with ORA, Foveal notch, trace cystic changes and temporal edema -- all stable).   Notes *Images captured and stored on drive  Diagnosis / Impression:  exu ARMD OU OD: stable improvement in SRF overlying PED OS: large central disciform scar with ORA, Foveal notch, trace cystic changes and temporal edema -- all stable  Clinical management:  See below  Abbreviations: NFP - Normal foveal profile. CME - cystoid macular edema. PED - pigment epithelial detachment. IRF - intraretinal fluid. SRF - subretinal fluid. EZ - ellipsoid zone. ERM - epiretinal membrane. ORA -  outer retinal atrophy. ORT - outer retinal tubulation. SRHM - subretinal hyper-reflective material. IRHM - intraretinal hyper-reflective material      Intravitreal Injection, Pharmacologic Agent - OD - Right Eye       Time Out 10/24/2022. 1:01 PM. Confirmed correct patient, procedure, site, and patient  consented.   Anesthesia Topical anesthesia was used. Anesthetic medications included Lidocaine 2%, Proparacaine 0.5%.   Procedure Preparation included 5% betadine to ocular surface, eyelid speculum. A (32g) needle was used.   Injection: 1.25 mg Bevacizumab 1.'25mg'$ /0.17m   Route: Intravitreal, Site: Right Eye   NDC: [id number] Lot:: [id number] Expiration date: 11/22/2022   Post-op Post injection exam found visual acuity of at least counting fingers. The patient tolerated the procedure well. There were no complications. The patient received written and verbal post procedure care education. Post injection medications were not given.            ASSESSMENT/PLAN:   ICD-10-CM   1. Exudative age-related macular degeneration of right eye with active choroidal neovascularization (HCC)  H35.3211 OCT, Retina - OU - Both Eyes    Intravitreal Injection, Pharmacologic Agent - OD - Right Eye    Bevacizumab (AVASTIN) SOLN 1.25 mg    2. Exudative age-related macular degeneration of left eye with active choroidal neovascularization ([place]  H35.3221     3. Essential hypertension  I10     4. Hypertensive retinopathy of both eyes  H35.033     5. Combined forms of age-related cataract of both eyes  H25.813       1. Exudative age related macular degeneration OD  - pt initially presented w/ 2 wk history of decreased vision OD  - history of intravitreal injections OS with Dr. RZadie Rhinein 2017 and earlier -- pt reports stopping visits after the development of a corneal abrasion from a lid speculum.  - s/p IVA OD #1 (12.21.21), #2 (01.24.22), #3 (2.21.22), #4 (03.22.22), #5 (4.26.22), #6 (5.31.22), #7 (7.12.22), #8 (8.16.22), #9 (9.20.22), #10 (10.25.22), #11 (12.6.22), #12 (01.10.23), #13 (02.14.23), #14 (03.21.23), #15 (04.25.23), #16 06.06.23), #17 (08.01.23), #18 (09.12.23)  **history of increased SRF at 6 wk interval, noted on 12.6.22**  **history of increased SRF at 8 wk interval, noted on  08.01.23**  - OCT shows OD: stable improvement in SRF overlying stable PED at 7 wks  - BCVA OD 20/40 -- stable             - Recommend IVA OD #19 today, 10.31.23 w/ f/u extended to 7-8 wks  - RBA of procedure discussed, questions answered  - informed consent obtained and signed  - see procedure note  - f/u in 7-8 wks -- DFE/OCT, possible injection   2. Exudative age related macular degeneration, OS  - OS with history inactive disciform scar, but new focal IRH noted 5.31.22 -- stably resolved on exam today - s/p IVA OS #1 (05.31.22), #2 (07.12.22), #3 (8.15.22), [facility]20.22[facility] history of intravitreal injections OS with Dr. RZadie Rhinein 2017 and earlier -- pt reports stopping visits after the development of a corneal abrasion from a lid speculum  - before being referred here, had not seen a retina specialist since 2017  - exam and OCT OS shows stable disciform scar -- no IRF/SRF  - BCVA CF OS - recommend holding IVA OS today - f/u 7-8 weeks, DFE, OCT  3,4. Hypertensive retinopathy OU - discussed importance of tight BP control - monitor  5. Mixed Cataract OU - The symptoms of cataract,  surgical options, and treatments and risks were discussed with patient. - discussed diagnosis and progression - approaching visual significance - had appt with Dr. Lucianne Lei, but is going to hold off on surgery for now -- concerns due to her functional monocularity   Ophthalmic Meds Ordered this visit:  Meds ordered this encounter  Medications   Bevacizumab (AVASTIN) SOLN 1.25 mg     Return for f/u 7-8 weeks, exu ARMD OD, DFE, OCT.  There are no Patient Instructions on file for this visit.  This document serves as a record of services personally performed by Gardiner Sleeper, MD, PhD. It was created on their behalf by Renaldo Reel, [place] an ophthalmic technician. The creation of this record is the provider's dictation and/or activities during the visit.    Electronically signed by:   Renaldo Reel, COT  10.17.23 5:41 PM  This document serves as a record of services personally performed by Gardiner Sleeper, MD, PhD. It was created on their behalf by San Jetty. Owens Shark, OA an ophthalmic technician. The creation of this record is the provider's dictation and/or activities during the visit.    Electronically signed by: San Jetty. Owens Shark, [place] 10.31.2023 5:41 PM  Gardiner Sleeper, M.D., Ph.D. Diseases & Surgery of the Retina and Vitreous [facility] 10/24/2022    I have reviewed the above documentation for accuracy and completeness, and I agree with the above. Gardiner Sleeper, M.D., Ph.D. 10/24/22 5:42 PM   Abbreviations: M myopia (nearsighted); A astigmatism; H hyperopia (farsighted); P presbyopia; Mrx spectacle prescription;  CTL contact lenses; OD right eye; OS left eye; OU both eyes  XT exotropia; ET esotropia; PEK punctate epithelial keratitis; PEE punctate epithelial erosions; DES dry eye syndrome; MGD meibomian gland dysfunction; ATs artificial tears; PFAT's preservative free artificial tears; [place] nuclear sclerotic cataract; PSC posterior subcapsular cataract; ERM epi-retinal membrane; PVD posterior vitreous detachment; RD retinal detachment; DM diabetes mellitus; DR diabetic retinopathy; NPDR non-proliferative diabetic retinopathy; PDR proliferative diabetic retinopathy; CSME clinically significant macular edema; DME diabetic macular edema; dbh dot blot hemorrhages; CWS cotton wool spot; POAG primary open angle glaucoma; C/D cup-to-disc ratio; HVF humphrey visual field; GVF goldmann visual field; OCT optical coherence tomography; IOP intraocular pressure; BRVO Branch retinal vein occlusion; CRVO central retinal vein occlusion; CRAO central retinal artery occlusion; BRAO branch retinal artery occlusion; RT retinal tear; SB scleral buckle; PPV pars plana vitrectomy; VH Vitreous hemorrhage; PRP panretinal laser photocoagulation; IVK intravitreal kenalog; VMT  vitreomacular traction; MH Macular hole;  NVD neovascularization of the disc; NVE neovascularization elsewhere; AREDS age related eye disease study; ARMD age related macular degeneration; POAG primary open angle glaucoma; EBMD epithelial/anterior basement membrane dystrophy; ACIOL anterior chamber intraocular lens; IOL intraocular lens; PCIOL posterior chamber intraocular lens; Phaco/IOL phacoemulsification with intraocular lens placement; [place] photorefractive keratectomy; LASIK laser assisted in situ keratomileusis; HTN hypertension; DM diabetes mellitus; COPD chronic obstructive pulmonary disease

## 2022-10-24 ENCOUNTER — Encounter (INDEPENDENT_AMBULATORY_CARE_PROVIDER_SITE_OTHER): Payer: Self-pay | Admitting: Ophthalmology

## 2022-10-24 ENCOUNTER — Ambulatory Visit (INDEPENDENT_AMBULATORY_CARE_PROVIDER_SITE_OTHER): Payer: Medicare Other | Admitting: Ophthalmology

## 2022-10-24 DIAGNOSIS — H35033 Hypertensive retinopathy, bilateral: Secondary | ICD-10-CM

## 2022-10-24 DIAGNOSIS — I10 Essential (primary) hypertension: Secondary | ICD-10-CM | POA: Diagnosis not present

## 2022-10-24 DIAGNOSIS — H353221 Exudative age-related macular degeneration, left eye, with active choroidal neovascularization: Secondary | ICD-10-CM

## 2022-10-24 DIAGNOSIS — H353211 Exudative age-related macular degeneration, right eye, with active choroidal neovascularization: Secondary | ICD-10-CM

## 2022-10-24 DIAGNOSIS — H25813 Combined forms of age-related cataract, bilateral: Secondary | ICD-10-CM

## 2022-10-24 DIAGNOSIS — H353231 Exudative age-related macular degeneration, bilateral, with active choroidal neovascularization: Secondary | ICD-10-CM | POA: Diagnosis not present

## 2022-10-24 MED ORDER — BEVACIZUMAB CHEMO INJECTION 1.25MG/0.05ML SYRINGE FOR KALEIDOSCOPE
1.2500 mg | INTRAVITREAL | Status: AC | PRN
  Administered 2022-10-24: 1.25 mg via INTRAVITREAL

## 2022-11-28 NOTE — Progress Notes (Signed)
[facility] Note  12/12/2022     CHIEF COMPLAINT Patient presents for Retina Follow Up  HISTORY OF PRESENT ILLNESS: Kimberly Waters is a 76 y.o. female who presents to the clinic today for:  HPI     Retina Follow Up   Patient presents with  Wet AMD.  In both eyes.  This started 7 weeks ago.  Duration of 7 weeks.  I, the attending physician,  performed the HPI with the patient and updated documentation appropriately.        Comments   7 week retina follow up AMD and IVA OD pt states no vision changes noticed she denies any flashes or floaters       Last edited by Bernarda Caffey, MD on 12/12/2022  5:32 PM.    Pt states vision is good, no changes, she is not using any drops  Referring physician: Josetta Huddle, MD [address] [place],  [place] [address]  HISTORICAL INFORMATION:   Selected notes from the MEDICAL RECORD NUMBER Referred by Dr. Marin Comment for eval of decreased VA OD.   CURRENT MEDICATIONS: No current outpatient medications on file. (Ophthalmic Drugs)   No current facility-administered medications for this visit. (Ophthalmic Drugs)   Current Outpatient Medications (Other)  Medication Sig   albuterol (PROVENTIL,VENTOLIN) 90 MCG/ACT inhaler Inhale 2 puffs into the lungs every 4 (four) hours as needed.   albuterol (VENTOLIN HFA) 108 (90 Base) MCG/ACT inhaler Inhale 2 puffs into the lungs every 4 (four) hours as needed.   anastrozole (ARIMIDEX) 1 MG tablet Take 1 tablet (1 mg total) by mouth daily. Stop May 2023   arformoterol (BROVANA) 15 MCG/2ML NEBU Take 2 mLs (15 mcg total) by nebulization 2 (two) times daily.   betamethasone dipropionate 8.24 % cream 1 application   buPROPion (WELLBUTRIN SR) 150 MG 12 hr tablet Take 150 mg by mouth daily.    clobetasol ointment (TEMOVATE) 0.05 % as needed.   diphenhydrAMINE (BENADRYL) 25 MG tablet Take 25 mg by mouth as needed for sleep.   escitalopram (LEXAPRO) 10 MG tablet Take 1 tablet  (10 mg total) by mouth daily.   ezetimibe (ZETIA) 10 MG tablet Take 1 tablet (10 mg total) by mouth daily.   furosemide (LASIX) 40 MG tablet Take 1 tablet (40 mg total) by mouth daily.   hydrOXYzine (ATARAX/VISTARIL) 25 MG tablet Take 25 mg by mouth at bedtime.   imipramine (TOFRANIL) 50 MG tablet Take 50 mg by mouth at bedtime.   ipratropium (ATROVENT) 0.06 % nasal spray Place 2 sprays into the nose 4 (four) times daily.   ketoconazole (NIZORAL) 2 % cream Apply 1 application topically daily.   levocetirizine (XYZAL) 5 MG tablet Take 5 mg by mouth as needed for allergies.   levothyroxine (SYNTHROID, LEVOTHROID) 112 MCG tablet TAKE ONE TABLET BY MOUTH ONCE DAILY   meloxicam (MOBIC) 7.5 MG tablet Take 7.5 mg by mouth at bedtime.   mometasone (ELOCON) 0.1 % ointment Apply topically as needed.   nitroGLYCERIN (NITROSTAT) 0.4 MG SL tablet as needed.   omeprazole (PRILOSEC) 20 MG capsule Take 20 mg by mouth daily.   Potassium Chloride ER 20 MEQ TBCR 1 tablet with food   revefenacin (YUPELRI) 175 MCG/3ML nebulizer solution Take 3 mLs (175 mcg total) by nebulization daily.   rosuvastatin (CRESTOR) 20 MG tablet Take 1 tablet (20 mg total) by mouth daily.   tacrolimus (PROTOPIC) 0.1 % ointment Apply topically as needed.   Tiotropium  Bromide-Olodaterol (STIOLTO RESPIMAT) 2.5-2.5 MCG/ACT AERS Inhale 2 puffs into the lungs daily.   Tiotropium Bromide-Olodaterol (STIOLTO RESPIMAT) 2.5-2.5 MCG/ACT AERS Inhale 2 puffs into the lungs daily.   No current facility-administered medications for this visit. (Other)   REVIEW OF SYSTEMS:   ALLERGIES Allergies  Allergen Reactions   Latex Other (See Comments)    Other reaction(s): Other (See Comments) Tears skin.   Adhesive [Tape] Other (See Comments)    (skin tears)  Tolerates PAPER TAPE   Codeine Nausea Only   Other Nausea Only    Anesthesia--nausea    PAST MEDICAL HISTORY Past Medical History:  Diagnosis Date   Allergy    Breast cancer ([place])     Cataract    Colon polyps    Depression    Dyspnea    SEASONAL   Family history of breast cancer    Family history of colon cancer    GERD (gastroesophageal reflux disease)    History of radiation therapy 06/05/17-07/20/17   right chest wall 50.4 Gy in 28 fractions, right axillary nodal region 45 Gy in 25 fractions   Hyperlipidemia    Hypertension    Hypertensive retinopathy    Hypothyroidism    Macular degeneration    Peripheral neuropathy 06/18/2019   PONV (postoperative nausea and vomiting)    Past Surgical History:  Procedure Laterality Date   ABDOMINAL HYSTERECTOMY     KNEE SURGERY Right    MASTECTOMY W/ SENTINEL NODE BIOPSY Right 03/05/2017   Procedure: BILATERAL TOTAL MASTECTOMIES WITH RIGHT SENTINEL LYMPH NODE BIOPSIES;  Surgeon: Excell Seltzer, MD;  Location: [facility] OR;  Service: General;  Laterality: Right;   SHOULDER SURGERY     BILAT   FAMILY HISTORY Family History  Problem Relation Age of Onset   Heart disease Mother 53       stents   Colon cancer Maternal Grandfather 59   Colon cancer Maternal Aunt 65   Colon cancer Maternal Uncle        dx in his 88s   Breast cancer Paternal Aunt 80   Breast cancer Cousin 41       paternal first cousin   Lung cancer Paternal Grandfather    Colon cancer Maternal Uncle    Head & neck cancer Maternal Uncle        oral cancer   SOCIAL HISTORY Social History   Tobacco Use   Smoking status: Former    Packs/day: 1.00    Years: 45.00    Total pack years: 45.00    Types: Cigarettes    Quit date: 02/22/2017    Years since quitting: 5.8   Smokeless tobacco: Never  Vaping Use   Vaping Use: Never used  Substance Use Topics   Alcohol use: No    Alcohol/week: 0.0 standard drinks of alcohol   Drug use: No       OPHTHALMIC EXAM: Base Eye Exam     Visual Acuity (Snellen - Linear)       Right Left   Dist cc 20/50 -2 CF at 3'   Dist ph cc 20/50 +1 NI    Correction: Glasses         Tonometry (Tonopen, 1:18 PM)        Right Left   Pressure 15 16         Pupils       Pupils Dark Light Shape React APD   Right PERRL 3 2 Round Brisk None   Left PERRL 3 2 Round Brisk  None         Visual Fields       Left Right    Full Full         Extraocular Movement       Right Left    Full, Ortho Full, Ortho         Neuro/Psych     Oriented x3: Yes   Mood/Affect: Normal         Dilation     Both eyes: 2.5% Phenylephrine @ 1:18 PM           Slit Lamp and Fundus Exam     Slit Lamp Exam       Right Left   Lids/Lashes Dermatochalasis - upper lid Dermatochalasis - upper lid   Conjunctiva/Sclera nasal and temporal pinguecula nasal and temporal pinguecula   Cornea 3+ Punctate epithelial erosions, arcus, tear film debris 1+ Punctate epithelial erosions, arcus, tear film debris   Anterior Chamber deep, narrow temporal angle deep, narrow temporal angle   Iris Round and moderately dilated, mild anterior bowing Round and moderately dilated, mild anterior bowing   Lens 3+ Nuclear sclerosis with early brunescence, 3+ Cortical cataract 3+ Nuclear sclerosis with early brunescence, 3+ Cortical cataract   Anterior Vitreous Vitreous syneresis, Posterior vitreous detachment Vitreous syneresis, Posterior vitreous detachment         Fundus Exam       Right Left   Disc Pink and Sharp, Compact, mild PPA Mild Pallor, Sharp rim, +elevation   C/D Ratio 0.0 0.1   Macula Blunted foveal reflex, +CNV with pigment clumping/ring, shallow SRF - stably improved, RPE mottling and clumping, Drusen, no heme Blunted foveal reflex, large area of GA with disciform scar and subretinal fibrosis, central pigment clumping, no heme   Vessels attenuated, Tortuous attenuated, mild tortuosity, mild AV crossing changes   Periphery Attached, scattered reticular degeneration, paving stone degeneration Attached, scattered reticular degeneration, scattered paving stone degeneration           Refraction     Wearing Rx        Sphere Cylinder Axis   Right -6.50 +2.00 152   Left -5.25 +1.00 018    Type: SVL           IMAGING AND PROCEDURES  Imaging and Procedures for 12/12/2022  OCT, Retina - OU - Both Eyes       Right Eye Quality was borderline. Central Foveal Thickness: 314. Progression has been stable. Findings include no IRF, no SRF, abnormal foveal contour, subretinal hyper-reflective material, choroidal neovascular membrane, pigment epithelial detachment (Stable improvement in SRF overlying stable PED).   Left Eye Quality was good. Central Foveal Thickness: 276. Progression has been stable. Findings include no IRF, no SRF, abnormal foveal contour, subretinal hyper-reflective material, lamellar hole, outer retinal atrophy (large central disciform scar with ORA, Foveal notch, trace cystic changes and temporal edema -- all stable).   Notes *Images captured and stored on drive  Diagnosis / Impression:  exu ARMD OU OD: stable improvement in SRF overlying PED OS: large central disciform scar with ORA, Foveal notch, trace cystic changes and temporal edema -- all stable  Clinical management:  See below  Abbreviations: NFP - Normal foveal profile. CME - cystoid macular edema. PED - pigment epithelial detachment. IRF - intraretinal fluid. SRF - subretinal fluid. EZ - ellipsoid zone. ERM - epiretinal membrane. ORA - outer retinal atrophy. ORT - outer retinal tubulation. SRHM - subretinal hyper-reflective material. IRHM - intraretinal hyper-reflective material  Intravitreal Injection, Pharmacologic Agent - OD - Right Eye       Time Out 12/12/2022. 1:45 PM. Confirmed correct patient, procedure, site, and patient consented.   Anesthesia Topical anesthesia was used. Anesthetic medications included Lidocaine 2%, Proparacaine 0.5%.   Procedure Preparation included 5% betadine to ocular surface, eyelid speculum. A supplied (32g) needle was used.   Injection: 1.25 mg Bevacizumab  1.'25mg'$ /0.33m   Route: Intravitreal, Site: Right Eye   NDC: [id number] Lot: [id number]'$ , Expiration date: 01/24/2023   Post-op Post injection exam found visual acuity of at least counting fingers. The patient tolerated the procedure well. There were no complications. The patient received written and verbal post procedure care education. Post injection medications were not given.            ASSESSMENT/PLAN:   ICD-10-CM   1. Exudative age-related macular degeneration of right eye with active choroidal neovascularization (HCC)  H35.3211 OCT, Retina - OU - Both Eyes    Intravitreal Injection, Pharmacologic Agent - OD - Right Eye    Bevacizumab (AVASTIN) SOLN 1.25 mg    2. Exudative age-related macular degeneration of left eye with active choroidal neovascularization (HLoiza  H35.3221     3. Essential hypertension  I10     4. Hypertensive retinopathy of both eyes  H35.033     5. Combined forms of age-related cataract of both eyes  H25.813      1. Exudative age related macular degeneration OD  - pt initially presented w/ 2 wk history of decreased vision OD  - history [facility] intravitreal injections [address] earlier -- pt reports stopping visits after the development of a corneal abrasion from a lid speculum.  - s/p IVA OD #1 (12.21.21), #2 (01.24.22), #3 (2.21.22), #4 (03.22.22), #5 (4.26.22), #6 (5.31.22), #7 (7.12.22), #8 (8.16.22), #9 (9.20.22), #10 (10.25.22), #11 (12.6.22), #12 (01.10.23), #13 (02.14.23), #14 (03.21.23), #15 (04.25.23), #16 06.06.23), #17 (08.01.23), #18 (09.12.23), #19 (10.31.23)  **history of increased SRF at 6 wk interval, noted on 12.6.22**  **history of increased SRF at 8 wk interval, noted on 08.01.23**  - OCT shows OD: stable improvement in SRF overlying stable PED at 7 wks  - BCVA OD 20/40 -- stable             - Recommend IVA OD #20 today, 12.19.23 w/ f/u extended to 8 wks  - RBA of procedure discussed, questions answered  - informed  consent obtained and signed  - see procedure note  - f/u in 8 wks -- DFE/OCT, possible injection   2. Exudative age related macular degeneration, OS  - OS with history inactive disciform scar, but new focal IRH noted 5.31.22 -- stably resolved on exam today - s/p IVA OS #1 (05.31.22), #2 (07.12.22), #3 (8.15.22), [facility]9.20.22) [facility] -- pt reports stopping visits after the development of a corneal abrasion from a lid speculum  - before being referred here, had not seen a retina specialist since 2017  - exam and OCT OS shows stable disciform scar -- no IRF/SRF  - BCVA CF OS -- stable - recommend holding IVA OS today - f/u 8 weeks, DFE, OCT  3,4. Hypertensive retinopathy OU - discussed importance of tight BP control - monitor  5. Mixed Cataract OU - The symptoms of cataract, surgical options, and treatments and risks were discussed with patient. - discussed diagnosis and progression - approaching visual significance -  had appt with Dr. Lucianne Lei, but is going to hold off on surgery for now -- concerns due to her functional monocularity   Ophthalmic Meds Ordered this visit:  Meds ordered this encounter  Medications   Bevacizumab (AVASTIN) SOLN 1.25 mg     Return in about 8 weeks (around 02/06/2023) for f/u 8 weeks, exu ARMD OU, DFE, OCT.  There are no Patient Instructions on file for this visit.  This document serves as a record of services personally performed by Gardiner Sleeper, MD, PhD. It was created on their behalf by Renaldo Reel, [place] an ophthalmic technician. The creation of this record is the provider's dictation and/or activities during the visit.    Electronically signed by:  Renaldo Reel, COT  12.05.23 5:34 PM  This document serves as a record of services personally performed by Gardiner Sleeper, MD, PhD. It was created on their behalf by San Jetty. Owens Shark, OA an ophthalmic technician.  The creation of this record is the provider's dictation and/or activities during the visit.    Electronically signed by: San Jetty. Owens Shark, [place] 12.19.2023 5:34 PM  Gardiner Sleeper, M.D., Ph.D. Diseases & Surgery of the Retina and Vitreous [facility]  I have reviewed the above documentation for accuracy and completeness, and I agree with the above. Gardiner Sleeper, M.D., Ph.D. 12/12/22 5:36 PM   Abbreviations: M myopia (nearsighted); A astigmatism; H hyperopia (farsighted); P presbyopia; Mrx spectacle prescription;  CTL contact lenses; OD right eye; OS left eye; OU both eyes  XT exotropia; ET esotropia; PEK punctate epithelial keratitis; PEE punctate epithelial erosions; DES dry eye syndrome; MGD meibomian gland dysfunction; ATs artificial tears; PFAT's preservative free artificial tears; Century nuclear sclerotic cataract; PSC posterior subcapsular cataract; ERM epi-retinal membrane; PVD posterior vitreous detachment; RD retinal detachment; DM diabetes mellitus; DR diabetic retinopathy; NPDR non-proliferative diabetic retinopathy; PDR proliferative diabetic retinopathy; CSME clinically significant macular edema; DME diabetic macular edema; dbh dot blot hemorrhages; CWS cotton wool spot; POAG primary open angle glaucoma; C/D cup-to-disc ratio; HVF humphrey visual field; GVF goldmann visual field; OCT optical coherence tomography; IOP intraocular pressure; BRVO Branch retinal vein occlusion; CRVO central retinal vein occlusion; CRAO central retinal artery occlusion; BRAO branch retinal artery occlusion; RT retinal tear; SB scleral buckle; PPV pars plana vitrectomy; VH Vitreous hemorrhage; PRP panretinal laser photocoagulation; IVK intravitreal kenalog; VMT vitreomacular traction; MH Macular hole;  NVD neovascularization of the disc; NVE neovascularization elsewhere; AREDS age related eye disease study; ARMD age related macular degeneration; POAG primary open angle glaucoma; EBMD  epithelial/anterior basement membrane dystrophy; ACIOL anterior chamber intraocular lens; IOL intraocular lens; PCIOL posterior chamber intraocular lens; Phaco/IOL phacoemulsification with intraocular lens placement; Baden photorefractive keratectomy; LASIK laser assisted in situ keratomileusis; HTN hypertension; DM diabetes mellitus; COPD chronic obstructive pulmonary disease

## 2022-12-12 ENCOUNTER — Ambulatory Visit (INDEPENDENT_AMBULATORY_CARE_PROVIDER_SITE_OTHER): Payer: Medicare Other | Admitting: Ophthalmology

## 2022-12-12 ENCOUNTER — Encounter (INDEPENDENT_AMBULATORY_CARE_PROVIDER_SITE_OTHER): Payer: Self-pay | Admitting: Ophthalmology

## 2022-12-12 DIAGNOSIS — H353231 Exudative age-related macular degeneration, bilateral, with active choroidal neovascularization: Secondary | ICD-10-CM

## 2022-12-12 DIAGNOSIS — H353221 Exudative age-related macular degeneration, left eye, with active choroidal neovascularization: Secondary | ICD-10-CM

## 2022-12-12 DIAGNOSIS — H35033 Hypertensive retinopathy, bilateral: Secondary | ICD-10-CM

## 2022-12-12 DIAGNOSIS — H25813 Combined forms of age-related cataract, bilateral: Secondary | ICD-10-CM

## 2022-12-12 DIAGNOSIS — H353211 Exudative age-related macular degeneration, right eye, with active choroidal neovascularization: Secondary | ICD-10-CM

## 2022-12-12 DIAGNOSIS — I10 Essential (primary) hypertension: Secondary | ICD-10-CM

## 2022-12-12 MED ORDER — BEVACIZUMAB CHEMO INJECTION 1.25MG/0.05ML SYRINGE FOR KALEIDOSCOPE
1.2500 mg | INTRAVITREAL | Status: AC | PRN
  Administered 2022-12-12: 1.25 mg via INTRAVITREAL

## 2023-01-23 ENCOUNTER — Emergency Department (HOSPITAL_COMMUNITY): Payer: Medicare Other

## 2023-01-23 ENCOUNTER — Encounter (HOSPITAL_COMMUNITY): Payer: Self-pay | Admitting: Family Medicine

## 2023-01-23 ENCOUNTER — Other Ambulatory Visit: Payer: Self-pay

## 2023-01-23 ENCOUNTER — Observation Stay (HOSPITAL_COMMUNITY)
Admission: EM | Admit: 2023-01-23 | Discharge: 2023-01-24 | Disposition: A | Discharge status: Home / Self Care | Payer: Medicare Other | Attending: Internal Medicine | Admitting: Internal Medicine

## 2023-01-23 DIAGNOSIS — Z79899 Other long term (current) drug therapy: Secondary | ICD-10-CM | POA: Diagnosis not present

## 2023-01-23 DIAGNOSIS — Z1152 Encounter for screening for COVID-19: Secondary | ICD-10-CM | POA: Diagnosis not present

## 2023-01-23 DIAGNOSIS — E039 Hypothyroidism, unspecified: Secondary | ICD-10-CM | POA: Insufficient documentation

## 2023-01-23 DIAGNOSIS — I2510 Atherosclerotic heart disease of native coronary artery without angina pectoris: Secondary | ICD-10-CM | POA: Diagnosis not present

## 2023-01-23 DIAGNOSIS — E785 Hyperlipidemia, unspecified: Secondary | ICD-10-CM | POA: Diagnosis present

## 2023-01-23 DIAGNOSIS — I5032 Chronic diastolic (congestive) heart failure: Secondary | ICD-10-CM | POA: Diagnosis present

## 2023-01-23 DIAGNOSIS — Z170 Estrogen receptor positive status [ER+]: Secondary | ICD-10-CM

## 2023-01-23 DIAGNOSIS — Z87891 Personal history of nicotine dependence: Secondary | ICD-10-CM | POA: Insufficient documentation

## 2023-01-23 DIAGNOSIS — J449 Chronic obstructive pulmonary disease, unspecified: Secondary | ICD-10-CM | POA: Diagnosis present

## 2023-01-23 DIAGNOSIS — R519 Headache, unspecified: Secondary | ICD-10-CM | POA: Diagnosis not present

## 2023-01-23 DIAGNOSIS — N1832 Chronic kidney disease, stage 3b: Secondary | ICD-10-CM | POA: Diagnosis not present

## 2023-01-23 DIAGNOSIS — E782 Mixed hyperlipidemia: Secondary | ICD-10-CM

## 2023-01-23 DIAGNOSIS — R0602 Shortness of breath: Secondary | ICD-10-CM | POA: Diagnosis not present

## 2023-01-23 DIAGNOSIS — R55 Syncope and collapse: Secondary | ICD-10-CM

## 2023-01-23 DIAGNOSIS — I130 Hypertensive heart and chronic kidney disease with heart failure and stage 1 through stage 4 chronic kidney disease, or unspecified chronic kidney disease: Secondary | ICD-10-CM | POA: Insufficient documentation

## 2023-01-23 DIAGNOSIS — Z853 Personal history of malignant neoplasm of breast: Secondary | ICD-10-CM | POA: Insufficient documentation

## 2023-01-23 DIAGNOSIS — Z91040 Latex allergy status: Secondary | ICD-10-CM | POA: Diagnosis not present

## 2023-01-23 DIAGNOSIS — I10 Essential (primary) hypertension: Secondary | ICD-10-CM | POA: Diagnosis present

## 2023-01-23 HISTORY — DX: Syncope and collapse: R55

## 2023-01-23 LAB — COMPREHENSIVE METABOLIC PANEL
ALT: 37 U/L (ref 0–44)
AST: 48 U/L — ABNORMAL HIGH (ref 15–41)
Albumin: 3.9 g/dL (ref 3.5–5.0)
Alkaline Phosphatase: 48 U/L (ref 38–126)
Anion gap: 9 (ref 5–15)
BUN: 15 mg/dL (ref 8–23)
CO2: 21 mmol/L — ABNORMAL LOW (ref 22–32)
Calcium: 9.2 mg/dL (ref 8.9–10.3)
Chloride: 105 mmol/L (ref 98–111)
Creatinine, Ser: 1.74 mg/dL — ABNORMAL HIGH (ref 0.44–1.00)
GFR, Estimated: 30 mL/min — ABNORMAL LOW (ref >60)
Glucose, Bld: 122 mg/dL — ABNORMAL HIGH (ref 70–99)
Potassium: 3.9 mmol/L (ref 3.5–5.1)
Sodium: 135 mmol/L (ref 135–145)
Total Bilirubin: 0.3 mg/dL (ref 0.3–1.2)
Total Protein: 7 g/dL (ref 6.5–8.1)

## 2023-01-23 LAB — TROPONIN I (HIGH SENSITIVITY)
Troponin I (High Sensitivity): 15 ng/L (ref <18)
Troponin I (High Sensitivity): 16 ng/L (ref <18)

## 2023-01-23 LAB — CBC WITH DIFFERENTIAL/PLATELET
Abs Immature Granulocytes: 0.02 10*3/uL (ref 0.00–0.07)
Basophils Absolute: 0.1 10*3/uL (ref 0.0–0.1)
Basophils Relative: 1 %
Eosinophils Absolute: 0.4 10*3/uL (ref 0.0–0.5)
Eosinophils Relative: 4 %
HCT: 40.5 % (ref 36.0–46.0)
Hemoglobin: 13.4 g/dL (ref 12.0–15.0)
Immature Granulocytes: 0 %
Lymphocytes Relative: 29 %
Lymphs Abs: 3.1 10*3/uL (ref 0.7–4.0)
MCH: 29.1 pg (ref 26.0–34.0)
MCHC: 33.1 g/dL (ref 30.0–36.0)
MCV: 87.9 fL (ref 80.0–100.0)
Monocytes Absolute: 0.7 10*3/uL (ref 0.1–1.0)
Monocytes Relative: 6 %
Neutro Abs: 6.6 10*3/uL (ref 1.7–7.7)
Neutrophils Relative %: 60 %
Platelets: 372 10*3/uL (ref 150–400)
RBC: 4.61 MIL/uL (ref 3.87–5.11)
RDW: 14.6 % (ref 11.5–15.5)
WBC: 10.9 10*3/uL — ABNORMAL HIGH (ref 4.0–10.5)
nRBC: 0 % (ref 0.0–0.2)

## 2023-01-23 LAB — RESP PANEL BY RT-PCR (RSV, FLU A&B, COVID)  RVPGX2
Influenza A by PCR: NEGATIVE
Influenza B by PCR: NEGATIVE
Resp Syncytial Virus by PCR: NEGATIVE
SARS Coronavirus 2 by RT PCR: NEGATIVE

## 2023-01-23 LAB — BRAIN NATRIURETIC PEPTIDE: B Natriuretic Peptide: 60.2 pg/mL (ref 0.0–100.0)

## 2023-01-23 MED ORDER — SENNOSIDES-DOCUSATE SODIUM 8.6-50 MG PO TABS: 1.0000 | ORAL_TABLET | Freq: Every evening | ORAL | Status: DC | PRN

## 2023-01-23 MED ORDER — UMECLIDINIUM BROMIDE 62.5 MCG/ACT IN AEPB
1.0000 | INHALATION_SPRAY | Freq: Every day | RESPIRATORY_TRACT | Status: DC
  Filled 2023-01-23: qty 7

## 2023-01-23 MED ORDER — PANTOPRAZOLE SODIUM 40 MG PO TBEC
40.0000 mg | DELAYED_RELEASE_TABLET | Freq: Every day | ORAL | Status: DC
  Administered 2023-01-24: 40 mg via ORAL
  Filled 2023-01-23: qty 1

## 2023-01-23 MED ORDER — ALBUTEROL SULFATE (2.5 MG/3ML) 0.083% IN NEBU: 2.5000 mg | INHALATION_SOLUTION | RESPIRATORY_TRACT | Status: DC | PRN

## 2023-01-23 MED ORDER — SODIUM CHLORIDE 0.9 % IV BOLUS
500.0000 mL | Freq: Once | INTRAVENOUS | Status: AC
  Administered 2023-01-23: 500 mL via INTRAVENOUS

## 2023-01-23 MED ORDER — LEVOTHYROXINE SODIUM 100 MCG PO TABS
100.0000 ug | ORAL_TABLET | Freq: Every day | ORAL | Status: DC
  Administered 2023-01-24: 100 ug via ORAL
  Filled 2023-01-23: qty 1

## 2023-01-23 MED ORDER — ONDANSETRON HCL 4 MG/2ML IJ SOLN
4.0000 mg | Freq: Once | INTRAMUSCULAR | Status: AC
  Administered 2023-01-23: 4 mg via INTRAVENOUS
  Filled 2023-01-23: qty 2

## 2023-01-23 MED ORDER — ARFORMOTEROL TARTRATE 15 MCG/2ML IN NEBU
15.0000 ug | INHALATION_SOLUTION | Freq: Two times a day (BID) | RESPIRATORY_TRACT | Status: DC
  Administered 2023-01-24: 15 ug via RESPIRATORY_TRACT
  Filled 2023-01-23: qty 2

## 2023-01-23 MED ORDER — HEPARIN SODIUM (PORCINE) 5000 UNIT/ML IJ SOLN
5000.0000 [IU] | Freq: Three times a day (TID) | INTRAMUSCULAR | Status: DC
  Administered 2023-01-23 – 2023-01-24 (×2): 5000 [IU] via SUBCUTANEOUS
  Filled 2023-01-23 (×2): qty 1

## 2023-01-23 MED ORDER — IOHEXOL 350 MG/ML SOLN
55.0000 mL | Freq: Once | INTRAVENOUS | Status: AC | PRN
  Administered 2023-01-23: 55 mL via INTRAVENOUS

## 2023-01-23 MED ORDER — SODIUM CHLORIDE 0.9% FLUSH
3.0000 mL | Freq: Two times a day (BID) | INTRAVENOUS | Status: DC
  Administered 2023-01-23: 3 mL via INTRAVENOUS

## 2023-01-23 MED ORDER — ACETAMINOPHEN 325 MG PO TABS: 650.0000 mg | ORAL_TABLET | Freq: Four times a day (QID) | ORAL | Status: DC | PRN

## 2023-01-23 MED ORDER — POTASSIUM CHLORIDE CRYS ER 20 MEQ PO TBCR
20.0000 meq | EXTENDED_RELEASE_TABLET | Freq: Every day | ORAL | Status: DC
  Administered 2023-01-24: 20 meq via ORAL
  Filled 2023-01-23: qty 1

## 2023-01-23 MED ORDER — DIPHENHYDRAMINE HCL 50 MG/ML IJ SOLN
12.5000 mg | Freq: Once | INTRAMUSCULAR | Status: AC
  Administered 2023-01-23: 12.5 mg via INTRAVENOUS
  Filled 2023-01-23: qty 1

## 2023-01-23 MED ORDER — KETOROLAC TROMETHAMINE 15 MG/ML IJ SOLN
15.0000 mg | Freq: Once | INTRAMUSCULAR | Status: AC
  Administered 2023-01-23: 15 mg via INTRAVENOUS
  Filled 2023-01-23: qty 1

## 2023-01-23 MED ORDER — ACETAMINOPHEN 650 MG RE SUPP: 650.0000 mg | Freq: Four times a day (QID) | RECTAL | Status: DC | PRN

## 2023-01-23 MED ORDER — HYDROXYZINE HCL 25 MG PO TABS
25.0000 mg | ORAL_TABLET | Freq: Every day | ORAL | Status: DC
  Administered 2023-01-23: 25 mg via ORAL
  Filled 2023-01-23: qty 1

## 2023-01-23 MED ORDER — FUROSEMIDE 20 MG PO TABS
40.0000 mg | ORAL_TABLET | Freq: Every day | ORAL | Status: DC
  Administered 2023-01-24: 40 mg via ORAL
  Filled 2023-01-23: qty 2

## 2023-01-23 NOTE — H&P (Signed)
History and Physical    GIANI WINTHER ELF:[id number] DOB: [birth year] DOA: 01/23/2023  PCP: Josetta Huddle, MD   Patient coming from: Home   Chief Complaint: Blacking out   HPI: [name] is a pleasant 77 y.o. female with medical history significant for breast cancer status post definitive surgery with no evidence for recurrence at last oncology appointment, hyperlipidemia, hypothyroidism, COPD, chronic diastolic CHF, CKD 3B, and depression who presents to the emergency department after 3 days of recurrent episodes involving transient loss of consciousness.  Patient reports experiencing a headache for the past 3 days and recurrent episodes, always when laying down, that involve rapid palpitations, lightheadedness, and then brief loss of consciousness.  Patient is also experiencing visual hallucinations with these episodes.  She denies biting her tongue or losing continence of bowel or bladder during the episodes.  No recent fever, chills, or head trauma.  No recent change in medications.  ED Course: Upon arrival to the ED, patient is found to be afebrile and saturating mid 90s on room air with stable blood pressure.  Chest x-ray is negative for acute cardiopulmonary disease and head CT is negative for acute intracranial abnormality.  CTA head and neck is negative for large vessel occlusion or hemodynamically significant stenosis.  Blood work is notable for creatinine 1.74, normal troponin x 2, and normal BNP.  Patient was treated in the emergency department with 500 mL of normal saline, Toradol, Benadryl, and Zofran.  Review of Systems:  All other systems reviewed and apart from HPI, are negative.  Past Medical History:  Diagnosis Date   Allergy    Breast cancer ([place])    Cataract    Colon polyps    Depression    Dyspnea    SEASONAL   Family history of breast cancer    Family history of colon cancer    GERD (gastroesophageal reflux disease)    History of radiation therapy  06/05/17-07/20/17   right chest wall 50.4 Gy in 28 fractions, right axillary nodal region 45 Gy in 25 fractions   Hyperlipidemia    Hypertension    Hypertensive retinopathy    Hypothyroidism    Macular degeneration    Peripheral neuropathy 06/18/2019   PONV (postoperative nausea and vomiting)     Past Surgical History:  Procedure Laterality Date   ABDOMINAL HYSTERECTOMY     KNEE SURGERY Right    MASTECTOMY W/ SENTINEL NODE BIOPSY Right 03/05/2017   Procedure: BILATERAL TOTAL MASTECTOMIES WITH RIGHT SENTINEL LYMPH NODE BIOPSIES;  Surgeon: Excell Seltzer, MD;  Location: [facility];  Service: General;  Laterality: Right;   SHOULDER SURGERY     BILAT    Social History:   reports that she quit smoking about 5 years ago. Her smoking use included cigarettes. She has a 45.00 pack-year smoking history. She has never used smokeless tobacco. She reports that she does not drink alcohol and does not use drugs.  Allergies  Allergen Reactions   Latex Other (See Comments)    Other reaction(s): Other (See Comments) Tears skin.   Adhesive [Tape] Other (See Comments)    (skin tears)  Tolerates PAPER TAPE   Codeine Nausea Only   Other Nausea Only    Anesthesia--nausea     Family History  Problem Relation Age of Onset   Heart disease Mother 38       stents   Colon cancer Maternal Grandfather 36   Colon cancer Maternal Aunt 65   Colon cancer Maternal Uncle  dx in his 33s   Breast cancer Paternal Aunt 81   Breast cancer Cousin 83       paternal first cousin   Lung cancer Paternal Grandfather    Colon cancer Maternal Uncle    Head & neck cancer Maternal Uncle        oral cancer     Prior to Admission medications   Medication Sig Start Date End Date Taking? Authorizing Provider  albuterol (PROVENTIL,VENTOLIN) 90 MCG/ACT inhaler Inhale 2 puffs into the lungs every 4 (four) hours as needed. 04/04/11   Lyndal Pulley, DO  albuterol (VENTOLIN HFA) 108 (90 Base) MCG/ACT inhaler Inhale  2 puffs into the lungs every 4 (four) hours as needed.    [provider]  anastrozole (ARIMIDEX) 1 MG tablet Take 1 tablet (1 mg total) by mouth daily. Stop May 2023 12/07/21   Magrinat, Virgie Dad, MD  arformoterol (BROVANA) 15 MCG/2ML NEBU Take 2 mLs (15 mcg total) by nebulization 2 (two) times daily. 07/27/21   Parrett, Fonnie Mu, NP  betamethasone dipropionate 0.63 % cream 1 application    [provider]  buPROPion (WELLBUTRIN SR) 150 MG 12 hr tablet Take 150 mg by mouth daily.  07/09/19   [provider]  clobetasol ointment (TEMOVATE) 0.05 % as needed. 07/23/20   [provider]  diphenhydrAMINE (BENADRYL) 25 MG tablet Take 25 mg by mouth as needed for sleep.    [provider]  escitalopram (LEXAPRO) 10 MG tablet Take 1 tablet (10 mg total) by mouth daily. 04/04/18   Geradine Girt, DO  ezetimibe (ZETIA) 10 MG tablet Take 1 tablet (10 mg total) by mouth daily. 03/08/22   Jerline Pain, MD  furosemide (LASIX) 40 MG tablet Take 1 tablet (40 mg total) by mouth daily. 04/04/18   Geradine Girt, DO  hydrOXYzine (ATARAX/VISTARIL) 25 MG tablet Take 25 mg by mouth at bedtime.    [provider]  imipramine (TOFRANIL) 50 MG tablet Take 50 mg by mouth at bedtime.    [provider]  ipratropium (ATROVENT) 0.06 % nasal spray Place 2 sprays into the nose 4 (four) times daily. 09/17/18   Brand Males, MD  ketoconazole (NIZORAL) 2 % cream Apply 1 application topically daily. 07/21/21   Magrinat, Virgie Dad, MD  levocetirizine (XYZAL) 5 MG tablet Take 5 mg by mouth as needed for allergies.    [provider]  levothyroxine (SYNTHROID, LEVOTHROID) 112 MCG tablet TAKE ONE TABLET BY MOUTH ONCE DAILY 03/27/14   Leone Haven, MD  meloxicam (MOBIC) 7.5 MG tablet Take 7.5 mg by mouth at bedtime.    [provider]  mometasone (ELOCON) 0.1 % ointment Apply topically as needed. 08/04/20   [provider]  nitroGLYCERIN  (NITROSTAT) 0.4 MG SL tablet as needed. 10/22/20   [provider]  omeprazole (PRILOSEC) 20 MG capsule Take 20 mg by mouth daily.    [provider]  Potassium Chloride ER 20 MEQ TBCR 1 tablet with food    [provider]  revefenacin (YUPELRI) 175 MCG/3ML nebulizer solution Take 3 mLs (175 mcg total) by nebulization daily. 07/27/21   Parrett, Fonnie Mu, NP  rosuvastatin (CRESTOR) 20 MG tablet Take 1 tablet (20 mg total) by mouth daily. 03/08/22   Jerline Pain, MD  tacrolimus (PROTOPIC) 0.1 % ointment Apply topically as needed. 08/04/20   [provider]  Tiotropium Bromide-Olodaterol (STIOLTO RESPIMAT) 2.5-2.5 MCG/ACT AERS Inhale 2 puffs into the lungs daily. 06/09/21  Parrett, Tammy S, NP  Tiotropium Bromide-Olodaterol (STIOLTO RESPIMAT) 2.5-2.5 MCG/ACT AERS Inhale 2 puffs into the lungs daily. 06/09/21   Melvenia Needles, NP    Physical Exam: Vitals:   01/23/23 1923 01/23/23 1945 01/23/23 2015 01/23/23 2144  BP: (!) 161/78 (!) 149/73 (!) 157/73   Pulse: (!) 54 (!) 54 (!) 57   Resp: 18  20   Temp:    98 F (36.7 C)  TempSrc:      SpO2: 94% 96% 94%     Constitutional: NAD, calm  Eyes: PERTLA, lids and conjunctivae normal ENMT: Mucous membranes are moist. Posterior pharynx clear of any exudate or lesions.   Neck: supple, no masses  Respiratory:  no wheezing, no crackles. No accessory muscle use.  Cardiovascular: S1 & S2 heard, regular rate and rhythm. No extremity edema.   Abdomen: No distension, no tenderness, soft. Bowel sounds active.  Musculoskeletal: no clubbing / cyanosis. No joint deformity upper and lower extremities.   Skin: no significant rashes, lesions, ulcers. Warm, dry, well-perfused. Neurologic: Gross hearing deficit, CN 2-12 grossly intact otherwise. Strength 5/5 in all 4 limbs. Alert and oriented.  Psychiatric: Pleasant. Cooperative.    Labs and Imaging on Admission: I have personally reviewed following labs and imaging  studies  CBC: Recent Labs  Lab 01/23/23 1055  WBC 10.9*  NEUTROABS 6.6  HGB 13.4  HCT 40.5  MCV 87.9  PLT 633   Basic Metabolic Panel: Recent Labs  Lab 01/23/23 1055  NA 135  K 3.9  CL 105  CO2 21*  GLUCOSE 122*  BUN 15  CREATININE 1.74*  CALCIUM 9.2   GFR: CrCl cannot be calculated (Unknown ideal weight.). Liver Function Tests: Recent Labs  Lab 01/23/23 1055  AST 48*  ALT 37  ALKPHOS 48  BILITOT 0.3  PROT 7.0  ALBUMIN 3.9   No results for input(s): "LIPASE", "AMYLASE" in the last 168 hours. No results for input(s): "AMMONIA" in the last 168 hours. Coagulation Profile: No results for input(s): "INR", "PROTIME" in the last 168 hours. Cardiac Enzymes: No results for input(s): "CKTOTAL", "CKMB", "CKMBINDEX", "TROPONINI" in the last 168 hours. BNP (last 3 results) No results for input(s): "PROBNP" in the last 8760 hours. HbA1C: No results for input(s): "HGBA1C" in the last 72 hours. CBG: No results for input(s): "GLUCAP" in the last 168 hours. Lipid Profile: No results for input(s): "CHOL", "HDL", "LDLCALC", "TRIG", "CHOLHDL", "LDLDIRECT" in the last 72 hours. Thyroid Function Tests: No results for input(s): "TSH", "T4TOTAL", "FREET4", "T3FREE", "THYROIDAB" in the last 72 hours. Anemia Panel: No results for input(s): "VITAMINB12", "FOLATE", "FERRITIN", "TIBC", "IRON", "RETICCTPCT" in the last 72 hours. Urine analysis: No results found for: "COLORURINE", "APPEARANCEUR", "LABSPEC", "PHURINE", "GLUCOSEU", "HGBUR", "BILIRUBINUR", "KETONESUR", "PROTEINUR", "UROBILINOGEN", "NITRITE", "LEUKOCYTESUR" Sepsis Labs: '@LABRCNTIP'$ (procalcitonin:4,lacticidven:4) ) Recent Results (from the past 240 hour(s))  Resp panel by RT-PCR (RSV, Flu A&B, Covid) Anterior Nasal Swab     Status: None   Collection Time: 01/23/23 10:50 AM   Specimen: Anterior Nasal Swab  Result Value Ref Range Status   SARS Coronavirus 2 by RT PCR NEGATIVE NEGATIVE Final    Comment: (NOTE) SARS-CoV-2  target nucleic acids are NOT DETECTED.  The SARS-CoV-2 RNA is generally detectable in upper respiratory specimens during the acute phase of infection. The lowest concentration of SARS-CoV-2 viral copies this assay can detect is 138 copies/mL. A negative result does not preclude SARS-Cov-2 infection and should not be used as the sole basis for treatment or other patient management decisions. A  negative result may occur with  improper specimen collection/handling, submission of specimen other than nasopharyngeal swab, presence of viral mutation(s) within the areas targeted by this assay, and inadequate number of viral copies(<138 copies/mL). A negative result must be combined with clinical observations, patient history, and epidemiological information. The expected result is Negative.  Fact Sheet for Patients:  EntrepreneurPulse.com.au  Fact Sheet for Healthcare Providers:  IncredibleEmployment.be  This test is no t yet approved or cleared by the [place] FDA and  has been authorized for detection and/or diagnosis of SARS-CoV-2 by FDA under an Emergency Use Authorization (EUA). This EUA will remain  in effect (meaning this test can be used) for the duration of the COVID-19 declaration under Section 564(b)(1) of the Act, 21 U.S.C.section 360bbb-3(b)(1), unless the authorization is terminated  or revoked sooner.       Influenza A by PCR NEGATIVE NEGATIVE Final   Influenza B by PCR NEGATIVE NEGATIVE Final    Comment: (NOTE) The Xpert Xpress SARS-CoV-2/FLU/RSV plus assay is intended as an aid in the diagnosis of influenza from Nasopharyngeal swab specimens and should not be used as a sole basis for treatment. Nasal washings and aspirates are unacceptable for Xpert Xpress SARS-CoV-2/FLU/RSV testing.  Fact Sheet for Patients: EntrepreneurPulse.com.au  Fact Sheet for Healthcare  Providers: IncredibleEmployment.be  This test is not yet approved or cleared by the [place] FDA and has been authorized for detection and/or diagnosis of SARS-CoV-2 by FDA under an Emergency Use Authorization (EUA). This EUA will remain in effect (meaning this test can be used) for the duration of the COVID-19 declaration under Section 564(b)(1) of the Act, 21 U.S.C. section 360bbb-3(b)(1), unless the authorization is terminated or revoked.     Resp Syncytial Virus by PCR NEGATIVE NEGATIVE Final    Comment: (NOTE) Fact Sheet for Patients: EntrepreneurPulse.com.au  Fact Sheet for Healthcare Providers: IncredibleEmployment.be  This test is not yet approved or cleared by the [place] FDA and has been authorized for detection and/or diagnosis of SARS-CoV-2 by FDA under an Emergency Use Authorization (EUA). This EUA will remain in effect (meaning this test can be used) for the duration of the COVID-19 declaration under Section 564(b)(1) of the Act, 21 U.S.C. section 360bbb-3(b)(1), unless the authorization is terminated or revoked.  Performed at [facility], [place] [address] [place], [place] [address]      Radiological Exams on Admission: CT Angio Head Neck W WO CM  Result Date: 01/23/2023 CLINICAL DATA:  Headache, syncope EXAM: CT ANGIOGRAPHY HEAD AND NECK TECHNIQUE: Multidetector CT imaging of the head and neck was performed using the standard protocol during bolus administration of intravenous contrast. Multiplanar CT image reconstructions and MIPs were obtained to evaluate the vascular anatomy. Carotid stenosis measurements (when applicable) are obtained utilizing NASCET criteria, using the distal internal carotid diameter as the denominator. RADIATION DOSE REDUCTION: This exam was performed according to the departmental dose-optimization program which includes automated exposure control, adjustment of the mA  and/or kV according to patient size and/or use of iterative reconstruction technique. CONTRAST:  81m OMNIPAQUE IOHEXOL 350 MG/ML SOLN COMPARISON:  No prior CTA available, correlation is made with 07/07/2019 MRI head FINDINGS: CT HEAD FINDINGS Brain: No evidence of acute infarct, hemorrhage, mass, mass effect, or midline shift. No hydrocephalus or extra-axial fluid collection. Vascular: No hyperdense vessel. Skull: Negative for fracture or focal lesion. Sinuses/Orbits: No acute finding. Other: The mastoid air cells are well aerated. CTA NECK FINDINGS Aortic arch: Standard branching. Imaged portion shows no evidence  of aneurysm or dissection. No significant stenosis of the major arch vessel origins. Aortic atherosclerosis. Right carotid system: No evidence of dissection, occlusion, or hemodynamically significant stenosis (greater than 50%). Left carotid system: No evidence of dissection, occlusion, or hemodynamically significant stenosis (greater than 50%). Vertebral arteries: No evidence of dissection, occlusion, or hemodynamically significant stenosis (greater than 50%). Skeleton: No acute osseous abnormality. Other neck: Negative. Upper chest: Emphysema. No focal pulmonary opacity or pleural effusion. Review of the MIP images confirms the above findings CTA HEAD FINDINGS Anterior circulation: Both internal carotid arteries are patent to the termini, without significant stenosis. A1 segments patent. Normal anterior communicating artery. Anterior cerebral arteries are patent to their distal aspects. No M1 stenosis or occlusion. MCA branches perfused and symmetric. Posterior circulation: The right vertebral artery primarily supplies the right PICA, with a diminutive continuing right V4 patent to the vertebrobasilar junction. The left vertebral artery is patent to the vertebrobasilar junction. Posterior inferior cerebellar arteries patent proximally. Basilar patent to its distal aspect. Superior cerebellar arteries  patent proximally. Patent P1 segments. PCAs perfused to their distal aspects without stenosis. Right posterior communicating artery is diminutive but patent. The left posterior communicating artery is not definitively seen. Venous sinuses: Not well opacified due to phase of contrast Anatomic variants: None significant. Review of the MIP images confirms the above findings IMPRESSION: 1. No acute intracranial process. 2. No intracranial large vessel occlusion or significant stenosis. 3. No hemodynamically significant stenosis in the neck. 4. Emphysema. 5. Aortic atherosclerosis. Aortic Atherosclerosis (ICD10-I70.0) and Emphysema (ICD10-J43.9). Electronically Signed   By: Merilyn Baba M.D.   On: 01/23/2023 18:51   DG Chest 1 View  Result Date: 01/23/2023 CLINICAL DATA:  Central chest pain/tightness.  Shortness of breath. EXAM: CHEST  1 VIEW COMPARISON:  Chest radiograph 06/09/2021 FINDINGS: The cardiomediastinal silhouette is normal, allowing for rightward patient rotation. There is no focal consolidation or pulmonary edema. There is no pleural effusion or pneumothorax There is no acute osseous abnormality. IMPRESSION: No radiographic evidence of acute cardiopulmonary process. Electronically Signed   By: Valetta Mole M.D.   On: 01/23/2023 11:41    EKG: Independently reviewed. Sinus rhythm, chronic LBBB.   Assessment/Plan  1. Transient loss of consciousness  - Presents with recurrent episodes involving palpitations, panic, lightheadedness, hallucination, and brief LOC  - ?Syncope, seizure, or neuropsychiatric effects of medications  - Hold Wellbutrin, check orthostatic vitals, continue cardiac monitoring, check echocardiogram and EEG    2. COPD  - Stable, not in exacerbation  - Continue LAMA-LABA and prn SABA    3. Chronic diastolic CHF  - Appears compensated  - Continue Lasix, monitor volume status    4. CKD IIIB  - Appears close to baseline  - Renally-dose medications, monitor     5.  Hyperlipidemia  - Continue Crestor    6. Hypothyroidism  - Continue Synthroid    DVT prophylaxis: sq heparin  Code Status: Full  Level of Care: Level of care: Telemetry Medical Family Communication: None present  Disposition Plan:  Patient is from: home  Anticipated d/c is to: TBD Anticipated d/c date is: 1/21 or 01/25/23  Patient currently: Pending TTE, EEG, cardiac monitoring  Consults called: None  Admission status: Observation     Vianne Bulls, MD [facility]  01/23/2023, 9:49 PM

## 2023-01-23 NOTE — ED Triage Notes (Signed)
Pt reports headache for several days and then reports symptoms that she thinks are seizures-she feels like she is going to black out and then she gets "thrown over" on her side and passes out. States this happens only when she lays down flat so she slept upright last night. Also reports hallucinations of black hair growing out of her bed sheets.

## 2023-01-23 NOTE — Progress Notes (Shared)
[facility] Note  02/06/2023     CHIEF COMPLAINT Patient presents for No chief complaint on file.  HISTORY OF PRESENT ILLNESS: Kimberly Waters is a 77 y.o. female who presents to the clinic today for:   Pt states vision is good, no changes, she is not using any drops  Referring physician: Josetta Huddle, MD [address] [place],  [place] [address]  HISTORICAL INFORMATION:   Selected notes from the MEDICAL RECORD NUMBER Referred by Dr. Marin Comment for eval of decreased VA OD.   CURRENT MEDICATIONS: No current outpatient medications on file. (Ophthalmic Drugs)   No current facility-administered medications for this visit. (Ophthalmic Drugs)   Current Outpatient Medications (Other)  Medication Sig   albuterol (PROVENTIL,VENTOLIN) 90 MCG/ACT inhaler Inhale 2 puffs into the lungs every 4 (four) hours as needed.   albuterol (VENTOLIN HFA) 108 (90 Base) MCG/ACT inhaler Inhale 2 puffs into the lungs every 4 (four) hours as needed.   anastrozole (ARIMIDEX) 1 MG tablet Take 1 tablet (1 mg total) by mouth daily. Stop May 2023   arformoterol (BROVANA) 15 MCG/2ML NEBU Take 2 mLs (15 mcg total) by nebulization 2 (two) times daily.   betamethasone dipropionate 8.75 % cream 1 application   buPROPion (WELLBUTRIN SR) 150 MG 12 hr tablet Take 150 mg by mouth daily.    clobetasol ointment (TEMOVATE) 0.05 % as needed.   diphenhydrAMINE (BENADRYL) 25 MG tablet Take 25 mg by mouth as needed for sleep.   escitalopram (LEXAPRO) 10 MG tablet Take 1 tablet (10 mg total) by mouth daily.   ezetimibe (ZETIA) 10 MG tablet Take 1 tablet (10 mg total) by mouth daily.   furosemide (LASIX) 40 MG tablet Take 1 tablet (40 mg total) by mouth daily.   hydrOXYzine (ATARAX/VISTARIL) 25 MG tablet Take 25 mg by mouth at bedtime.   imipramine (TOFRANIL) 50 MG tablet Take 50 mg by mouth at bedtime.   ipratropium (ATROVENT) 0.06 % nasal spray Place 2 sprays into the nose 4 (four) times  daily.   ketoconazole (NIZORAL) 2 % cream Apply 1 application topically daily.   levocetirizine (XYZAL) 5 MG tablet Take 5 mg by mouth as needed for allergies.   levothyroxine (SYNTHROID, LEVOTHROID) 112 MCG tablet TAKE ONE TABLET BY MOUTH ONCE DAILY   meloxicam (MOBIC) 7.5 MG tablet Take 7.5 mg by mouth at bedtime.   mometasone (ELOCON) 0.1 % ointment Apply topically as needed.   nitroGLYCERIN (NITROSTAT) 0.4 MG SL tablet as needed.   omeprazole (PRILOSEC) 20 MG capsule Take 20 mg by mouth daily.   Potassium Chloride ER 20 MEQ TBCR 1 tablet with food   revefenacin (YUPELRI) 175 MCG/3ML nebulizer solution Take 3 mLs (175 mcg total) by nebulization daily.   rosuvastatin (CRESTOR) 20 MG tablet Take 1 tablet (20 mg total) by mouth daily.   tacrolimus (PROTOPIC) 0.1 % ointment Apply topically as needed.   Tiotropium Bromide-Olodaterol (STIOLTO RESPIMAT) 2.5-2.5 MCG/ACT AERS Inhale 2 puffs into the lungs daily.   Tiotropium Bromide-Olodaterol (STIOLTO RESPIMAT) 2.5-2.5 MCG/ACT AERS Inhale 2 puffs into the lungs daily.   No current facility-administered medications for this visit. (Other)   REVIEW OF SYSTEMS:   ALLERGIES Allergies  Allergen Reactions   Latex Other (See Comments)    Other reaction(s): Other (See Comments) Tears skin.   Adhesive [Tape] Other (See Comments)    (skin tears)  Tolerates PAPER TAPE   Codeine Nausea Only   Other Nausea Only  Anesthesia--nausea    PAST MEDICAL HISTORY Past Medical History:  Diagnosis Date   Allergy    Breast cancer ([place])    Cataract    Colon polyps    Depression    Dyspnea    SEASONAL   Family history of breast cancer    Family history of colon cancer    GERD (gastroesophageal reflux disease)    History of radiation therapy 06/05/17-07/20/17   right chest wall 50.4 Gy in 28 fractions, right axillary nodal region 45 Gy in 25 fractions   Hyperlipidemia    Hypertension    Hypertensive retinopathy    Hypothyroidism    Macular  degeneration    Peripheral neuropathy 06/18/2019   PONV (postoperative nausea and vomiting)    Past Surgical History:  Procedure Laterality Date   ABDOMINAL HYSTERECTOMY     KNEE SURGERY Right    MASTECTOMY W/ SENTINEL NODE BIOPSY Right 03/05/2017   Procedure: BILATERAL TOTAL MASTECTOMIES WITH RIGHT SENTINEL LYMPH NODE BIOPSIES;  Surgeon: Excell Seltzer, MD;  Location: [facility] OR;  Service: General;  Laterality: Right;   SHOULDER SURGERY     BILAT   FAMILY HISTORY Family History  Problem Relation Age of Onset   Heart disease Mother 106       stents   Colon cancer Maternal Grandfather 59   Colon cancer Maternal Aunt 65   Colon cancer Maternal Uncle        dx in his 51s   Breast cancer Paternal Aunt 70   Breast cancer Cousin 31       paternal first cousin   Lung cancer Paternal Grandfather    Colon cancer Maternal Uncle    Head & neck cancer Maternal Uncle        oral cancer   SOCIAL HISTORY Social History   Tobacco Use   Smoking status: Former    Packs/day: 1.00    Years: 45.00    Total pack years: 45.00    Types: Cigarettes    Quit date: 02/22/2017    Years since quitting: 5.9   Smokeless tobacco: Never  Vaping Use   Vaping Use: Never used  Substance Use Topics   Alcohol use: No    Alcohol/week: 0.0 standard drinks of alcohol   Drug use: No       OPHTHALMIC EXAM: Not recorded    IMAGING AND PROCEDURES  Imaging and Procedures for 02/06/2023          ASSESSMENT/PLAN:   ICD-10-CM   1. Exudative age-related macular degeneration of right eye with active choroidal neovascularization ([place])  H35.3211     2. Exudative age-related macular degeneration of left eye with active choroidal neovascularization ([place])  H35.3221     3. Essential hypertension  I10     4. Hypertensive retinopathy of both eyes  H35.033     5. Combined forms of age-related cataract of both eyes  H25.813       1. Exudative age related macular degeneration OD  - pt initially presented w/  2 wk history of decreased vision OD  - history of intravitreal injections OS with Dr. Zadie Rhine in 2017 and earlier -- pt reports stopping visits after the development of a corneal abrasion from a lid speculum.  - s/p IVA OD #1 (12.21.21), #2 (01.24.22), #3 (2.21.22), #4 (03.22.22), #5 (4.26.22), #6 (5.31.22), #7 (7.12.22), #8 (8.16.22), #9 (9.20.22), #10 (10.25.22), #11 (12.6.22), #12 (01.10.23), #13 (02.14.23), #14 (03.21.23), #15 (04.25.23), #16 06.06.23), #17 (08.01.23), #18 (09.12.23), #19 (10.31.23), #20 (12.19.23)  **  history of increased SRF at 6 wk interval, noted on 12.6.22**  **history of increased SRF at 8 wk interval, noted on 08.01.23**  - OCT shows OD: stable improvement in SRF overlying stable PED at 7 wks  - BCVA OD 20/40 -- stable             - Recommend IVA OD #21 today, 02.13.24 w/ f/u extended to 8 wks  - RBA of procedure discussed, questions answered  - informed consent obtained and signed  - see procedure note  - f/u in 8 wks -- DFE/OCT, possible injection   2. Exudative age related macular degeneration, OS  - OS with history inactive disciform scar, but new focal IRH noted 5.31.22 -- stably resolved on exam today - s/p IVA OS #1 (05.31.22), #2 (07.12.22), #3 (8.15.22), #4 (9.20.22) for focal [facility]  - history of intravitreal injections OS with Dr. Zadie Rhine in 2017 and earlier -- pt reports stopping visits after the development of a corneal abrasion from a lid speculum  - before being referred here, had not seen a retina specialist since 2017  - exam and OCT OS shows stable disciform scar -- no IRF/SRF  - BCVA CF OS -- stable - recommend holding IVA OS today - f/u 8 weeks, DFE, OCT  3,4. Hypertensive retinopathy OU - discussed importance of tight BP control - continue to monitor  5. Mixed Cataract OU - The symptoms of cataract, surgical options, and treatments and risks were discussed with patient. - discussed diagnosis and progression - approaching visual significance -  had appt with Dr. Lucianne Lei, but is going to hold off on surgery for now -- concerns due to her functional monocularity   Ophthalmic Meds Ordered this visit:  No orders of the defined types were placed in this encounter.    No follow-ups on file.  There are no Patient Instructions on file for this visit.  This document serves as a record of services personally performed by Gardiner Sleeper, MD, PhD. It was created on their behalf by Renaldo Reel, [place] an ophthalmic technician. The creation of this record is the provider's dictation and/or activities during the visit.    Electronically signed by:  Renaldo Reel, COT  1.30.24 7:54 AM    Gardiner Sleeper, M.D., Ph.D. Diseases & Surgery of the Retina and Vitreous [facility]: M myopia (nearsighted); A astigmatism; H hyperopia (farsighted); P presbyopia; Mrx spectacle prescription;  CTL contact lenses; OD right eye; OS left eye; OU both eyes  XT exotropia; ET esotropia; PEK punctate epithelial keratitis; PEE punctate epithelial erosions; DES dry eye syndrome; MGD meibomian gland dysfunction; ATs artificial tears; PFAT's preservative free artificial tears; [place] nuclear sclerotic cataract; PSC posterior subcapsular cataract; ERM epi-retinal membrane; PVD posterior vitreous detachment; RD retinal detachment; DM diabetes mellitus; DR diabetic retinopathy; NPDR non-proliferative diabetic retinopathy; PDR proliferative diabetic retinopathy; CSME clinically significant macular edema; DME diabetic macular edema; dbh dot blot hemorrhages; CWS cotton wool spot; POAG primary open angle glaucoma; C/D cup-to-disc ratio; HVF humphrey visual field; GVF goldmann visual field; OCT optical coherence tomography; IOP intraocular pressure; BRVO Branch retinal vein occlusion; CRVO central retinal vein occlusion; CRAO central retinal artery occlusion; BRAO branch retinal artery occlusion; RT retinal tear; SB scleral buckle; PPV  pars plana vitrectomy; VH Vitreous hemorrhage; PRP panretinal laser photocoagulation; IVK intravitreal kenalog; VMT vitreomacular traction; MH Macular hole;  NVD neovascularization of the disc; NVE neovascularization elsewhere; AREDS age related eye disease study; ARMD  age related macular degeneration; POAG primary open angle glaucoma; EBMD epithelial/anterior basement membrane dystrophy; ACIOL anterior chamber intraocular lens; IOL intraocular lens; PCIOL posterior chamber intraocular lens; Phaco/IOL phacoemulsification with intraocular lens placement; [place] photorefractive keratectomy; LASIK laser assisted in situ keratomileusis; HTN hypertension; DM diabetes mellitus; COPD chronic obstructive pulmonary disease

## 2023-01-23 NOTE — ED Provider Triage Note (Signed)
Emergency Medicine Provider Triage Evaluation Note  Kimberly Waters , a 77 y.o. female  was evaluated in triage.  Pt complains of headaches and shortness of breath.  Patient reports that she has been having headache for the last 3 days consistently and feels like it is on the crown of her head.  She also reports that she believes she has been having seizures over the last few days as well where she feels like she is blacking out and "being turned over".  Patient does report a prior history of congestive heart failure reports that she has had increasing shortness of breath over the last few days and had an episode of chest pain yesterday which required her to give her stop a dose of nitroglycerin.  He does not recall of the dose of nitroglycerin helped her chest pain.  Review of Systems  Positive: As above Negative: As above  Physical Exam  BP (!) 169/97 (BP Location: Right Arm)   Pulse 64   Temp 98.4 F (36.9 C) (Oral)   Resp (!) 22   SpO2 91%  Gen:   Awake, no distress Resp:  Normal effort MSK:   Moves extremities without difficulty Other:    Medical Decision Making  Medically screening exam initiated at 10:49 AM.  Appropriate orders placed.  Kimberly Waters was informed that the remainder of the evaluation will be completed by another provider, this initial triage assessment does not replace that evaluation, and the importance of remaining in the ED until their evaluation is complete.     Kimberly Heller, PA-C 01/23/23 1051

## 2023-01-23 NOTE — ED Provider Notes (Signed)
[facility] Provider Note   CSN: [id number] Arrival date & time: 01/23/23  1019     History  Chief Complaint  Patient presents with   Headache    Kimberly Waters is a 77 y.o. female. With past medical history of CAD, COPD, MGUS, diastolic CHF who presents to the emergency department with headache.   States she has had 3 days of headache. She describes it as heavy and involves the crown of the head. She states it has been constant. She states that over the past 3 nights she has been "blacking out" when laying down to bed. She states she could feel these episodes coming on and then would "black out" and "come to" every few seconds. She states when she is laying down she feels dizzy, nauseated and has the heavy feeling in her head. She states last night around 1230am she states the headache was the worst it has been and states "Everything went black and I couldn't breathe." She had associated palpitations and took nitroglycerin but is unsure if this helped. She denies changes to her vision, weakness/numbness/tingling to face or extremities, confusion, slurred speech. Denies head trauma, fever, neck pain or recent illnesses. Denies alcohol or drug use. States these symptoms do not happen during the day or sitting up.    Headache Associated symptoms: dizziness        Home Medications Prior to Admission medications   Medication Sig Start Date End Date Taking? Authorizing Provider  albuterol (PROVENTIL,VENTOLIN) 90 MCG/ACT inhaler Inhale 2 puffs into the lungs every 4 (four) hours as needed. 04/04/11   Lyndal Pulley, DO  albuterol (VENTOLIN HFA) 108 (90 Base) MCG/ACT inhaler Inhale 2 puffs into the lungs every 4 (four) hours as needed.    [provider]  anastrozole (ARIMIDEX) 1 MG tablet Take 1 tablet (1 mg total) by mouth daily. Stop May 2023 12/07/21   Magrinat, Virgie Dad, MD  arformoterol (BROVANA) 15 MCG/2ML NEBU Take 2 mLs (15 mcg  total) by nebulization 2 (two) times daily. 07/27/21   Parrett, Fonnie Mu, NP  betamethasone dipropionate 6.30 % cream 1 application    [provider]  buPROPion (WELLBUTRIN SR) 150 MG 12 hr tablet Take 150 mg by mouth daily.  07/09/19   [provider]  clobetasol ointment (TEMOVATE) 0.05 % as needed. 07/23/20   [provider]  diphenhydrAMINE (BENADRYL) 25 MG tablet Take 25 mg by mouth as needed for sleep.    [provider]  escitalopram (LEXAPRO) 10 MG tablet Take 1 tablet (10 mg total) by mouth daily. 04/04/18   Geradine Girt, DO  ezetimibe (ZETIA) 10 MG tablet Take 1 tablet (10 mg total) by mouth daily. 03/08/22   Jerline Pain, MD  furosemide (LASIX) 40 MG tablet Take 1 tablet (40 mg total) by mouth daily. 04/04/18   Geradine Girt, DO  hydrOXYzine (ATARAX/VISTARIL) 25 MG tablet Take 25 mg by mouth at bedtime.    [provider]  imipramine (TOFRANIL) 50 MG tablet Take 50 mg by mouth at bedtime.    [provider]  ipratropium (ATROVENT) 0.06 % nasal spray Place 2 sprays into the nose 4 (four) times daily. 09/17/18   Brand Males, MD  ketoconazole (NIZORAL) 2 % cream Apply 1 application topically daily. 07/21/21   Magrinat, Virgie Dad, MD  levocetirizine (XYZAL) 5 MG tablet Take 5 mg by mouth as needed for allergies.    [provider]  levothyroxine (SYNTHROID, LEVOTHROID) 112 MCG tablet TAKE ONE TABLET BY MOUTH ONCE DAILY 03/27/14   Leone Haven, MD  meloxicam (MOBIC) 7.5 MG tablet Take 7.5 mg by mouth at bedtime.    [provider]  mometasone (ELOCON) 0.1 % ointment Apply topically as needed. 08/04/20   [provider]  nitroGLYCERIN (NITROSTAT) 0.4 MG SL tablet as needed. 10/22/20   [provider]  omeprazole (PRILOSEC) 20 MG capsule Take 20 mg by mouth daily.    [provider]  Potassium Chloride ER 20 MEQ TBCR 1 tablet with food    [provider]  revefenacin (YUPELRI) 175  MCG/3ML nebulizer solution Take 3 mLs (175 mcg total) by nebulization daily. 07/27/21   Parrett, Fonnie Mu, NP  rosuvastatin (CRESTOR) 20 MG tablet Take 1 tablet (20 mg total) by mouth daily. 03/08/22   Jerline Pain, MD  tacrolimus (PROTOPIC) 0.1 % ointment Apply topically as needed. 08/04/20   [provider]  Tiotropium Bromide-Olodaterol (STIOLTO RESPIMAT) 2.5-2.5 MCG/ACT AERS Inhale 2 puffs into the lungs daily. 06/09/21   Parrett, Fonnie Mu, NP  Tiotropium Bromide-Olodaterol (STIOLTO RESPIMAT) 2.5-2.5 MCG/ACT AERS Inhale 2 puffs into the lungs daily. 06/09/21   Parrett, Fonnie Mu, NP      Allergies    Latex, Adhesive [tape], Codeine, and Other    Review of Systems   Review of Systems  Neurological:  Positive for dizziness and headaches.  All other systems reviewed and are negative.   Physical Exam Updated Vital Signs BP (!) 157/73   Pulse (!) 57   Temp 98.9 F (37.2 C)   Resp 20   SpO2 94%  Physical Exam Vitals and nursing note reviewed.  Constitutional:      General: She is not in acute distress.    Appearance: She is well-developed. She is obese. She is not ill-appearing or toxic-appearing.  HENT:     Head: Normocephalic and atraumatic.  Eyes:     General: No scleral icterus. Pulmonary:     Effort: Pulmonary effort is normal. No respiratory distress.  Skin:    Findings: No rash.  Neurological:     General: No focal deficit present.     Mental Status: She is alert.  Psychiatric:        Mood and Affect: Mood normal.        Behavior: Behavior normal.        Thought Content: Thought content normal.        Judgment: Judgment normal.     ED Results / Procedures / Treatments   Labs (all labs ordered are listed, but only abnormal results are displayed) Labs Reviewed  COMPREHENSIVE METABOLIC PANEL - Abnormal; Notable for the following components:      Result Value   CO2 21 (*)    Glucose, Bld 122 (*)    Creatinine, Ser 1.74 (*)    AST 48 (*)    GFR, Estimated 30  (*)    All other components within normal limits  CBC WITH DIFFERENTIAL/PLATELET - Abnormal; Notable for the following components:   WBC 10.9 (*)    All other components within normal limits  RESP PANEL BY RT-PCR (RSV, FLU A&B, COVID)  [id number]  BRAIN NATRIURETIC PEPTIDE  TROPONIN I (HIGH SENSITIVITY)  TROPONIN I (HIGH SENSITIVITY)   EKG None  Radiology CT Angio Head Neck W WO CM  Result Date: 01/23/2023 CLINICAL DATA:  Headache, syncope EXAM: CT ANGIOGRAPHY HEAD AND NECK TECHNIQUE: Multidetector CT imaging of the head and  neck was performed using the standard protocol during bolus administration of intravenous contrast. Multiplanar CT image reconstructions and MIPs were obtained to evaluate the vascular anatomy. Carotid stenosis measurements (when applicable) are obtained utilizing NASCET criteria, using the distal internal carotid diameter as the denominator. RADIATION DOSE REDUCTION: This exam was performed according to the departmental dose-optimization program which includes automated exposure control, adjustment of the mA and/or kV according to patient size and/or use of iterative reconstruction technique. CONTRAST:  60m OMNIPAQUE IOHEXOL 350 MG/ML SOLN COMPARISON:  No prior CTA available, correlation is made with 07/07/2019 MRI head FINDINGS: CT HEAD FINDINGS Brain: No evidence of acute infarct, hemorrhage, mass, mass effect, or midline shift. No hydrocephalus or extra-axial fluid collection. Vascular: No hyperdense vessel. Skull: Negative for fracture or focal lesion. Sinuses/Orbits: No acute finding. Other: The mastoid air cells are well aerated. CTA NECK FINDINGS Aortic arch: Standard branching. Imaged portion shows no evidence of aneurysm or dissection. No significant stenosis of the major arch vessel origins. Aortic atherosclerosis. Right carotid system: No evidence of dissection, occlusion, or hemodynamically significant stenosis (greater than 50%). Left carotid system: No evidence of  dissection, occlusion, or hemodynamically significant stenosis (greater than 50%). Vertebral arteries: No evidence of dissection, occlusion, or hemodynamically significant stenosis (greater than 50%). Skeleton: No acute osseous abnormality. Other neck: Negative. Upper chest: Emphysema. No focal pulmonary opacity or pleural effusion. Review of the MIP images confirms the above findings CTA HEAD FINDINGS Anterior circulation: Both internal carotid arteries are patent to the termini, without significant stenosis. A1 segments patent. Normal anterior communicating artery. Anterior cerebral arteries are patent to their distal aspects. No M1 stenosis or occlusion. MCA branches perfused and symmetric. Posterior circulation: The right vertebral artery primarily supplies the right PICA, with a diminutive continuing right V4 patent to the vertebrobasilar junction. The left vertebral artery is patent to the vertebrobasilar junction. Posterior inferior cerebellar arteries patent proximally. Basilar patent to its distal aspect. Superior cerebellar arteries patent proximally. Patent P1 segments. PCAs perfused to their distal aspects without stenosis. Right posterior communicating artery is diminutive but patent. The left posterior communicating artery is not definitively seen. Venous sinuses: Not well opacified due to phase of contrast Anatomic variants: None significant. Review of the MIP images confirms the above findings IMPRESSION: 1. No acute intracranial process. 2. No intracranial large vessel occlusion or significant stenosis. 3. No hemodynamically significant stenosis in the neck. 4. Emphysema. 5. Aortic atherosclerosis. Aortic Atherosclerosis (ICD10-I70.0) and Emphysema (ICD10-J43.9). Electronically Signed   By: AMerilyn BabaM.D.   On: 01/23/2023 18:51   DG Chest 1 View  Result Date: 01/23/2023 CLINICAL DATA:  Central chest pain/tightness.  Shortness of breath. EXAM: CHEST  1 VIEW COMPARISON:  Chest radiograph  06/09/2021 FINDINGS: The cardiomediastinal silhouette is normal, allowing for rightward patient rotation. There is no focal consolidation or pulmonary edema. There is no pleural effusion or pneumothorax There is no acute osseous abnormality. IMPRESSION: No radiographic evidence of acute cardiopulmonary process. Electronically Signed   By: PValetta MoleM.D.   On: 01/23/2023 11:41    Procedures Procedures    Medications Ordered in ED Medications  ketorolac (TORADOL) 15 MG/ML injection 15 mg (15 mg Intravenous Given 01/23/23 1738)  ondansetron (ZOFRAN) injection 4 mg (4 mg Intravenous Given 01/23/23 1739)  diphenhydrAMINE (BENADRYL) injection 12.5 mg (12.5 mg Intravenous Given 01/23/23 1739)  sodium chloride 0.9 % bolus 500 mL (0 mLs Intravenous Stopped 01/23/23 1815)  iohexol (OMNIPAQUE) 350 MG/ML injection 55 mL (55 mLs Intravenous Contrast Given 01/23/23  Fortunata.Canner)    ED Course/ Medical Decision Making/ A&P                             Medical Decision Making Amount and/or Complexity of Data Reviewed Radiology: ordered.  Risk Prescription drug management. Decision regarding hospitalization.  Initial Impression and Ddx 77 year old female who presents with headache and "black outs" over the past three days. She is currently well appearing. Non septic, non toxic. Non focal neuro exam. Triage began work up including cbc, cmp, bnp, resp panel, EKG and CXR. Will add on troponin, headache cocktail, CTA head neck. ?syncope? Patient PMH that increases complexity of ED encounter:  CAD, COPD, MGUS, diastolic CHF, asthma  Interpretation of Diagnostics I independent reviewed and interpreted the labs as followed: Cr 1.74, slightly elevated from previous. Likely chronic in nature. Giving 500 IVF. Wbc 10.9, likely reactive vs non specific. Bnp 60.2,   - I independently visualized the following imaging with scope of interpretation limited to determining acute life threatening conditions related to emergency  care: CXR, which revealed negative. CTA Head neck negative  Patient Reassessment and Ultimate Disposition/Management After initial exam, unclear whether the patient is describing syncope when describing "black outs" but would be high risk syncope. Obtaining troponin with other orders from triage. Additionally, will obtain CTA head neck give headache and symptoms with lying down.  Giving headache cocktail and will reassess.   Headache is improved.  Labs without any significant findings.  No critical anemia or electrolyte dysfunction.  EKG without significant dysrhythmia or ischemia.  Troponin is negative.  Creatinine is slightly elevated from previous but no AKI at this time.  She is given a 500 mL IV fluid bolus as well as a headache cocktail.  Chest x-ray without evidence of widened mediastinum or cardiomegaly that is new. CTA head neck is negative.  Unclear etiology of her blacking out episodes. I presume these to be syncopal episodes.  Given high risk features we will have her admitted to the hospitalist service for ongoing workup of her symptoms.  Spoke with Dr. Myna Hidalgo who agrees to admit the patient.  Will likely need echo or EEG for further evaluation.  Patient management required discussion with the following services or consulting groups:  Hospitalist Service  Complexity of Problems Addressed Acute complicated illness or Injury  Additional Data Reviewed and Analyzed Further history obtained from: Further history from spouse/family member, Recent PCP notes, and Care Everywhere  Patient Encounter Risk Assessment Consideration of hospitalization  Final Clinical Impression(s) / ED Diagnoses Final diagnoses:  Syncope, unspecified syncope type    Rx / DC Orders ED Discharge Orders     None         Mickie Hillier, PA-C 01/23/23 2123    Elgie Congo, MD 01/24/23 1121

## 2023-01-24 ENCOUNTER — Observation Stay (HOSPITAL_COMMUNITY): Payer: Medicare Other

## 2023-01-24 ENCOUNTER — Observation Stay (HOSPITAL_BASED_OUTPATIENT_CLINIC_OR_DEPARTMENT_OTHER): Payer: Medicare Other

## 2023-01-24 DIAGNOSIS — R55 Syncope and collapse: Secondary | ICD-10-CM

## 2023-01-24 DIAGNOSIS — R4182 Altered mental status, unspecified: Secondary | ICD-10-CM | POA: Diagnosis not present

## 2023-01-24 LAB — ECHOCARDIOGRAM COMPLETE
Area-P 1/2: 2.16 cm2
S' Lateral: 3.3 cm

## 2023-01-24 LAB — CBC
HCT: 38.9 % (ref 36.0–46.0)
Hemoglobin: 12.2 g/dL (ref 12.0–15.0)
MCH: 28.7 pg (ref 26.0–34.0)
MCHC: 31.4 g/dL (ref 30.0–36.0)
MCV: 91.5 fL (ref 80.0–100.0)
Platelets: 296 10*3/uL (ref 150–400)
RBC: 4.25 MIL/uL (ref 3.87–5.11)
RDW: 14.8 % (ref 11.5–15.5)
WBC: 9.2 10*3/uL (ref 4.0–10.5)
nRBC: 0 % (ref 0.0–0.2)

## 2023-01-24 LAB — BASIC METABOLIC PANEL
Anion gap: 13 (ref 5–15)
BUN: 11 mg/dL (ref 8–23)
CO2: 19 mmol/L — ABNORMAL LOW (ref 22–32)
Calcium: 8.5 mg/dL — ABNORMAL LOW (ref 8.9–10.3)
Chloride: 104 mmol/L (ref 98–111)
Creatinine, Ser: 1.77 mg/dL — ABNORMAL HIGH (ref 0.44–1.00)
GFR, Estimated: 30 mL/min — ABNORMAL LOW (ref >60)
Glucose, Bld: 130 mg/dL — ABNORMAL HIGH (ref 70–99)
Potassium: 4 mmol/L (ref 3.5–5.1)
Sodium: 136 mmol/L (ref 135–145)

## 2023-01-24 LAB — CBG MONITORING, ED: Glucose-Capillary: 137 mg/dL — ABNORMAL HIGH (ref 70–99)

## 2023-01-24 LAB — MAGNESIUM: Magnesium: 1.9 mg/dL (ref 1.7–2.4)

## 2023-01-24 NOTE — Discharge Summary (Signed)
[facility]  Physician Discharge Summary   Patient ID: Kimberly Waters MRN: [id number] DOB/AGE: 1946/09/16 77 y.o.  Admit date: 01/23/2023 Discharge date: 01/24/2023    PCP: Kimberly Huddle, MD  DISCHARGE DIAGNOSES:    Transient loss of consciousness   Hyperlipidemia   HYPERTENSION, BENIGN SYSTEMIC   Malignant neoplasm of upper-outer quadrant of right breast in female, estrogen receptor positive (HCC)   Chronic diastolic CHF (congestive heart failure) (HCC)   COPD (chronic obstructive pulmonary disease) ([place])   Coronary artery disease involving native coronary artery of native heart without angina pectoris   Stage 3b chronic kidney disease (CKD) ([place])   RECOMMENDATIONS FOR OUTPATIENT FOLLOW UP: Patient instructed to follow-up with PCP early next week Message sent to cardiology to arrange outpatient follow-up and consider heart monitor    Home Health: None Equipment/Devices: None  CODE STATUS: Full code  DISCHARGE CONDITION: fair  Diet recommendation: As before  INITIAL HISTORY:  77 y.o. female with medical history significant for breast cancer status post definitive surgery with no evidence for recurrence at last oncology appointment, hyperlipidemia, hypothyroidism, COPD, chronic diastolic CHF, CKD 3B, and depression who presents to the emergency department after 3 days of recurrent episodes involving transient loss of consciousness.   Patient reports experiencing a headache for the past 3 days and recurrent episodes, always when laying down, that involve rapid palpitations, lightheadedness, and then brief loss of consciousness.  Patient is also experiencing visual hallucinations with these episodes.  She denies biting her tongue or losing continence of bowel or bladder during the episodes.  No recent fever, chills, or head trauma.  No recent change in medications.   ED Course: Upon arrival to the ED, patient is found to be afebrile and saturating mid 90s on room air  with stable blood pressure.  Chest x-ray is negative for acute cardiopulmonary disease and head CT is negative for acute intracranial abnormality.  CTA head and neck is negative for large vessel occlusion or hemodynamically significant stenosis.  Blood work is notable for creatinine 1.74, normal troponin x 2, and normal BNP.   Patient was treated in the emergency department with 500 mL of normal saline, Toradol, Benadryl, and Zofran.    HOSPITAL COURSE:   Syncope Presented with recurrent episodes involving palpitations, panic, lightheadedness, hallucination, and brief LOC. ?Syncope, seizure, or neuropsychiatric effects of medications. Echocardiogram showed normal systolic function without any significant valvular abnormalities.  EEG was unremarkable.  MRI brain did not show any acute findings.  Patient denied being started on any new medications recently.  Patient seem to be back to baseline.  She ambulated with physical therapy.  May benefit from heart monitor.  Message sent to cardiology to arrange outpatient follow-up.  She is followed by Dr. Marlou Waters on a yearly basis.   COPD    Chronic diastolic CHF    CKD IIIB    Hyperlipidemia    Hypothyroidism   Obesity Estimated body mass index is 34.39 kg/m as calculated from the following:   Height as of 03/08/22: '5\' 2"'$  (1.575 m).   Weight as of 03/08/22: 85.3 kg.  Workup has been completed.  Patient feels well.  Okay for discharge home today.   PERTINENT LABS:  The results of significant diagnostics from this hospitalization (including imaging, microbiology, ancillary and laboratory) are listed below for reference.    Microbiology: Recent Results (from the past 240 hour(s))  Resp panel by RT-PCR (RSV, Flu A&B, Covid) Anterior Nasal Swab     Status: None  Collection Time: 01/23/23 10:50 AM   Specimen: Anterior Nasal Swab  Result Value Ref Range Status   SARS Coronavirus 2 by RT PCR NEGATIVE NEGATIVE Final    Comment:  (NOTE) SARS-CoV-2 target nucleic acids are NOT DETECTED.  The SARS-CoV-2 RNA is generally detectable in upper respiratory specimens during the acute phase of infection. The lowest concentration of SARS-CoV-2 viral copies this assay can detect is 138 copies/mL. A negative result does not preclude SARS-Cov-2 infection and should not be used as the sole basis for treatment or other patient management decisions. A negative result may occur with  improper specimen collection/handling, submission of specimen other than nasopharyngeal swab, presence of viral mutation(s) within the areas targeted by this assay, and inadequate number of viral copies(<138 copies/mL). A negative result must be combined with clinical observations, patient history, and epidemiological information. The expected result is Negative.  Fact Sheet for Patients:  EntrepreneurPulse.com.au  Fact Sheet for Healthcare Providers:  IncredibleEmployment.be  This test is no t yet approved or cleared by the [place] FDA and  has been authorized for detection and/or diagnosis of SARS-CoV-2 by FDA under an Emergency Use Authorization (EUA). This EUA will remain  in effect (meaning this test can be used) for the duration of the COVID-19 declaration under Section 564(b)(1) of the Act, 21 U.S.C.section 360bbb-3(b)(1), unless the authorization is terminated  or revoked sooner.       Influenza A by PCR NEGATIVE NEGATIVE Final   Influenza B by PCR NEGATIVE NEGATIVE Final    Comment: (NOTE) The Xpert Xpress SARS-CoV-2/FLU/RSV plus assay is intended as an aid in the diagnosis of influenza from Nasopharyngeal swab specimens and should not be used as a sole basis for treatment. Nasal washings and aspirates are unacceptable for Xpert Xpress SARS-CoV-2/FLU/RSV testing.  Fact Sheet for Patients: EntrepreneurPulse.com.au  Fact Sheet for Healthcare  Providers: IncredibleEmployment.be  This test is not yet approved or cleared by the [place] FDA and has been authorized for detection and/or diagnosis of SARS-CoV-2 by FDA under an Emergency Use Authorization (EUA). This EUA will remain in effect (meaning this test can be used) for the duration of the COVID-19 declaration under Section 564(b)(1) of the Act, 21 U.S.C. section 360bbb-3(b)(1), unless the authorization is terminated or revoked.     Resp Syncytial Virus by PCR NEGATIVE NEGATIVE Final    Comment: (NOTE) Fact Sheet for Patients: EntrepreneurPulse.com.au  Fact Sheet for Healthcare Providers: IncredibleEmployment.be  This test is not yet approved or cleared by the [place] FDA and has been authorized for detection and/or diagnosis of SARS-CoV-2 by FDA under an Emergency Use Authorization (EUA). This EUA will remain in effect (meaning this test can be used) for the duration of the COVID-19 declaration under Section 564(b)(1) of the Act, 21 U.S.C. section 360bbb-3(b)(1), unless the authorization is terminated or revoked.  Performed at [facility], [place] [address] [place], [place] [address]      Labs:   Basic Metabolic Panel: Recent Labs  Lab 01/23/23 1055 01/24/23 0517  NA 135 136  K 3.9 4.0  CL 105 104  CO2 21* 19*  GLUCOSE 122* 130*  BUN 15 11  CREATININE 1.74* 1.77*  CALCIUM 9.2 8.5*  MG  --  1.9   Liver Function Tests: Recent Labs  Lab 01/23/23 1055  AST 48*  ALT 37  ALKPHOS 48  BILITOT 0.3  PROT 7.0  ALBUMIN 3.9    CBC: Recent Labs  Lab 01/23/23 1055 01/24/23 0517  WBC 10.9*  9.2  NEUTROABS 6.6  --   HGB 13.4 12.2  HCT 40.5 38.9  MCV 87.9 91.5  PLT 372 296    BNP: BNP (last 3 results) Recent Labs    01/23/23 1055  BNP 60.2     CBG: Recent Labs  Lab 01/24/23 0516  GLUCAP 137*     IMAGING STUDIES ECHOCARDIOGRAM COMPLETE  Result Date:  01/24/2023    ECHOCARDIOGRAM REPORT   Patient Name:   Kimberly Waters Date of Exam: 01/24/2023 Medical Rec #:  [id number]     Height:       62.0 in Accession #:    [id number]    Weight:       188.0 lb Date of Birth:  24-Feb-1946     BSA:          1.862 m Patient Age:    79 years      BP:           147/75 mmHg Patient Gender: F             HR:           64 bpm. Exam Location:  Inpatient Procedure: 2D Echo, Cardiac Doppler and Color Doppler Indications:    Syncope R55  History:        Patient has prior history of Echocardiogram examinations, most                 recent 03/29/2018. Risk Factors:Hypertension and Dyslipidemia.  Sonographer:    Darlina Sicilian RDCS Referring Phys: [id number] TIMOTHY S OPYD  Sonographer Comments: Suboptimal parasternal window and suboptimal subcostal window. Image acquisition challenging due to mastectomy. IMPRESSIONS  1. Left ventricular ejection fraction, by estimation, is 50 to 55%. Left ventricular ejection fraction by PLAX is 59 %. The left ventricle has low normal function. The left ventricle has no regional wall motion abnormalities. There is mild concentric left ventricular hypertrophy. Left ventricular diastolic parameters are consistent with Grade I diastolic dysfunction (impaired relaxation).  2. Right ventricular systolic function is normal. The right ventricular size is normal.  3. The mitral valve is grossly normal. Trivial mitral valve regurgitation. No evidence of mitral stenosis.  4. The aortic valve is tricuspid. Aortic valve regurgitation is not visualized. No aortic stenosis is present.  5. The inferior vena cava is normal in size with greater than 50% respiratory variability, suggesting right atrial pressure of 3 mmHg. FINDINGS  Left Ventricle: Left ventricular ejection fraction, by estimation, is 50 to 55%. Left ventricular ejection fraction by PLAX is 59 %. The left ventricle has low normal function. The left ventricle has no regional wall motion abnormalities. The left  ventricular internal cavity size was normal in size. There is mild concentric left ventricular hypertrophy. Left ventricular diastolic parameters are consistent with Grade I diastolic dysfunction (impaired relaxation). Indeterminate filling pressures. Right Ventricle: The right ventricular size is normal. No increase in right ventricular wall thickness. Right ventricular systolic function is normal. Left Atrium: Left atrial size was normal in size. Right Atrium: Right atrial size was normal in size. Pericardium: Trivial pericardial effusion is present. The pericardial effusion is circumferential. Mitral Valve: The mitral valve is grossly normal. Trivial mitral valve regurgitation. No evidence of mitral valve stenosis. Tricuspid Valve: The tricuspid valve is grossly normal. Tricuspid valve regurgitation is not demonstrated. No evidence of tricuspid stenosis. Aortic Valve: The aortic valve is tricuspid. Aortic valve regurgitation is not visualized. No aortic stenosis is present. Pulmonic Valve: The pulmonic valve was grossly normal. Pulmonic  valve regurgitation is not visualized. No evidence of pulmonic stenosis. Aorta: The aortic root and ascending aorta are structurally normal, with no evidence of dilitation. Venous: The inferior vena cava is normal in size with greater than 50% respiratory variability, suggesting right atrial pressure of 3 mmHg. IAS/Shunts: No atrial level shunt detected by color flow Doppler.  LEFT VENTRICLE PLAX 2D LV EF:         Left            Diastology                ventricular     LV e' medial:    4.13 cm/s                ejection        LV E/e' medial:  12.8                fraction by     LV e' lateral:   3.26 cm/s                PLAX is 59      LV E/e' lateral: 16.2                %. LVIDd:         4.80 cm LVIDs:         3.30 cm LV PW:         1.10 cm LV IVS:        1.30 cm LVOT diam:     2.10 cm LV SV:         48 LV SV Index:   26 LVOT Area:     3.46 cm  RIGHT VENTRICLE RV S prime:      10.20 cm/s TAPSE (M-mode): 1.8 cm LEFT ATRIUM             Index        RIGHT ATRIUM          Index LA diam:        3.50 cm 1.88 cm/m   RA Area:     5.80 cm LA Vol (A2C):   23.7 ml 12.73 ml/m  RA Volume:   8.32 ml  4.47 ml/m LA Vol (A4C):   23.1 ml 12.41 ml/m LA Biplane Vol: 24.4 ml 13.10 ml/m  AORTIC VALVE LVOT Vmax:   89.40 cm/s LVOT Vmean:  61.900 cm/s LVOT VTI:    0.139 m  AORTA Ao Root diam: 2.80 cm Ao Asc diam:  3.10 cm MITRAL VALVE MV Area (PHT): 2.16 cm     SHUNTS MV Decel Time: 352 msec     Systemic VTI:  0.14 m MV E velocity: 52.90 cm/s   Systemic Diam: 2.10 cm MV A velocity: 102.00 cm/s MV E/A ratio:  0.52 Skeet Latch MD Electronically signed by Skeet Latch MD Signature Date/Time: 01/24/2023/1:05:10 PM    Final    MR BRAIN WO CONTRAST  Result Date: 01/24/2023 CLINICAL DATA:  Mental status change, unknown cause EXAM: MRI HEAD WITHOUT CONTRAST TECHNIQUE: Multiplanar, multiecho pulse sequences of the brain and surrounding structures were obtained without intravenous contrast. COMPARISON:  None Available. FINDINGS: Brain: No acute infarction, hemorrhage, hydrocephalus, extra-axial collection or mass lesion. Mild scattered T2/FLAIR hyperintensities in the white matter, nonspecific but compatible with chronic microvascular ischemic disease. Vascular: Major arterial flow voids are maintained. Skull and upper cervical spine: Normal marrow signal. Sinuses/Orbits: Clear sinuses.  No acute orbital findings. Other: Trace bilateral mastoid effusions. IMPRESSION: No evidence  of acute intracranial abnormality. Electronically Signed   By: Margaretha Sheffield M.D.   On: 01/24/2023 10:04   EEG adult  Result Date: 01/24/2023 Lora Havens, MD     01/24/2023  9:14 AM Patient Name: Kimberly Waters MRN: [id number] Epilepsy Attending: Lora Havens Referring Physician/Provider: Vianne Bulls, MD Date: 01/23/2023 Duration: 28.59 mins Patient history: 77 year old female with transient loss of  consciousness.  EEG to evaluate for seizure. Level of alertness: Awake AEDs during EEG study: None Technical aspects: This EEG study was done with scalp electrodes positioned according to the 10-20 International system of electrode placement. Electrical activity was reviewed with band pass filter of 1-'70Hz'$ , sensitivity of 7 uV/mm, display speed of 32m/sec with a '60Hz'$  notched filter applied as appropriate. EEG data were recorded continuously and digitally stored.  Video monitoring was available and reviewed as appropriate. Description: The posterior dominant rhythm consists of 9-10 Hz activity of moderate voltage (25-35 uV) seen predominantly in posterior head regions, symmetric and reactive to eye opening and eye closing. Hyperventilation did not show any EEG change.  Physiologic photic driving was not seen during photic stimulation. IMPRESSION: This study is within normal limits. No seizures or epileptiform discharges were seen throughout the recording. A normal interictal EEG does not exclude the diagnosis of epilepsy. PLora Havens  CT Angio Head Neck W WO CM  Result Date: 01/23/2023 CLINICAL DATA:  Headache, syncope EXAM: CT ANGIOGRAPHY HEAD AND NECK TECHNIQUE: Multidetector CT imaging of the head and neck was performed using the standard protocol during bolus administration of intravenous contrast. Multiplanar CT image reconstructions and MIPs were obtained to evaluate the vascular anatomy. Carotid stenosis measurements (when applicable) are obtained utilizing NASCET criteria, using the distal internal carotid diameter as the denominator. RADIATION DOSE REDUCTION: This exam was performed according to the departmental dose-optimization program which includes automated exposure control, adjustment of the mA and/or kV according to patient size and/or use of iterative reconstruction technique. CONTRAST:  55mOMNIPAQUE IOHEXOL 350 MG/ML SOLN COMPARISON:  No prior CTA available, correlation is made with  07/07/2019 MRI head FINDINGS: CT HEAD FINDINGS Brain: No evidence of acute infarct, hemorrhage, mass, mass effect, or midline shift. No hydrocephalus or extra-axial fluid collection. Vascular: No hyperdense vessel. Skull: Negative for fracture or focal lesion. Sinuses/Orbits: No acute finding. Other: The mastoid air cells are well aerated. CTA NECK FINDINGS Aortic arch: Standard branching. Imaged portion shows no evidence of aneurysm or dissection. No significant stenosis of the major arch vessel origins. Aortic atherosclerosis. Right carotid system: No evidence of dissection, occlusion, or hemodynamically significant stenosis (greater than 50%). Left carotid system: No evidence of dissection, occlusion, or hemodynamically significant stenosis (greater than 50%). Vertebral arteries: No evidence of dissection, occlusion, or hemodynamically significant stenosis (greater than 50%). Skeleton: No acute osseous abnormality. Other neck: Negative. Upper chest: Emphysema. No focal pulmonary opacity or pleural effusion. Review of the MIP images confirms the above findings CTA HEAD FINDINGS Anterior circulation: Both internal carotid arteries are patent to the termini, without significant stenosis. A1 segments patent. Normal anterior communicating artery. Anterior cerebral arteries are patent to their distal aspects. No M1 stenosis or occlusion. MCA branches perfused and symmetric. Posterior circulation: The right vertebral artery primarily supplies the right PICA, with a diminutive continuing right V4 patent to the vertebrobasilar junction. The left vertebral artery is patent to the vertebrobasilar junction. Posterior inferior cerebellar arteries patent proximally. Basilar patent to its distal aspect. Superior cerebellar arteries patent proximally. Patent P1 segments. PCAs  perfused to their distal aspects without stenosis. Right posterior communicating artery is diminutive but patent. The left posterior communicating artery  is not definitively seen. Venous sinuses: Not well opacified due to phase of contrast Anatomic variants: None significant. Review of the MIP images confirms the above findings IMPRESSION: 1. No acute intracranial process. 2. No intracranial large vessel occlusion or significant stenosis. 3. No hemodynamically significant stenosis in the neck. 4. Emphysema. 5. Aortic atherosclerosis. Aortic Atherosclerosis (ICD10-I70.0) and Emphysema (ICD10-J43.9). Electronically Signed   By: Merilyn Baba M.D.   On: 01/23/2023 18:51   DG Chest 1 View  Result Date: 01/23/2023 CLINICAL DATA:  Central chest pain/tightness.  Shortness of breath. EXAM: CHEST  1 VIEW COMPARISON:  Chest radiograph 06/09/2021 FINDINGS: The cardiomediastinal silhouette is normal, allowing for rightward patient rotation. There is no focal consolidation or pulmonary edema. There is no pleural effusion or pneumothorax There is no acute osseous abnormality. IMPRESSION: No radiographic evidence of acute cardiopulmonary process. Electronically Signed   By: Valetta Mole M.D.   On: 01/23/2023 11:41    DISCHARGE EXAMINATION: Vitals:   01/24/23 1215 01/24/23 1237 01/24/23 1300 01/24/23 1454  BP: (!) 158/90  (!) 145/83 (!) 150/84  Pulse: 62  67 81  Resp: '13  14 18  '$ Temp:  97.9 F (36.6 C)  98 F (36.7 C)  TempSrc:  Oral  Oral  SpO2: 96%  96% 97%   General appearance: Awake alert.  In no distress Resp: Clear to auscultation bilaterally.  Normal effort Cardio: S1-S2 is normal regular.  No S3-S4.  No rubs murmurs or bruit GI: Abdomen is soft.  Nontender nondistended.  Bowel sounds are present normal.  No masses organomegaly Extremities: No edema.  Full range of motion of lower extremities. Neurologic: Alert and oriented x3.  No focal neurological deficits.    DISPOSITION: Home  Discharge Instructions     Call MD for:  difficulty breathing, headache or visual disturbances   Complete by: As directed    Call MD for:  extreme fatigue   Complete  by: As directed    Call MD for:  persistant dizziness or light-headedness   Complete by: As directed    Call MD for:  persistant nausea and vomiting   Complete by: As directed    Call MD for:  severe uncontrolled pain   Complete by: As directed    Call MD for:  temperature >100.4   Complete by: As directed    Diet - low sodium heart healthy   Complete by: As directed    Discharge instructions   Complete by: As directed    Please take your medications as prescribed. Follow up with your PCP by early next week.  You were cared for by a hospitalist during your hospital stay. If you have any questions about your discharge medications or the care you received while you were in the hospital after you are discharged, you can call the unit and asked to speak with the hospitalist on call if the hospitalist that took care of you is not available. Once you are discharged, your primary care physician will handle any further medical issues. Please note that NO REFILLS for any discharge medications will be authorized once you are discharged, as it is imperative that you return to your primary care physician (or establish a relationship with a primary care physician if you do not have one) for your aftercare needs so that they can reassess your need for medications and monitor your lab values.  If you do not have a primary care physician, you can call [phone] for a physician referral.   Increase activity slowly   Complete by: As directed           Allergies as of 01/24/2023       Reactions   Latex Other (See Comments)   Other reaction(s): Other (See Comments) Tears skin.   Adhesive [tape] Other (See Comments)   (skin tears)  Tolerates PAPER TAPE   Codeine Nausea Only   Other Nausea Only   Anesthesia--nausea         Medication List     STOP taking these medications    meloxicam 7.5 MG tablet Commonly known as: MOBIC   Stiolto Respimat 2.5-2.5 MCG/ACT Aers Generic drug: Tiotropium  Bromide-Olodaterol       TAKE these medications    albuterol 90 MCG/ACT inhaler Commonly known as: PROVENTIL,VENTOLIN Inhale 2 puffs into the lungs every 4 (four) hours as needed. What changed: reasons to take this   anastrozole 1 MG tablet Commonly known as: ARIMIDEX Take 1 tablet (1 mg total) by mouth daily. Stop May 2023   arformoterol 15 MCG/2ML Nebu Commonly known as: BROVANA Take 2 mLs (15 mcg total) by nebulization 2 (two) times daily.   betamethasone dipropionate 0.99 % cream 1 application   buPROPion 150 MG 12 hr tablet Commonly known as: WELLBUTRIN SR Take 150 mg by mouth daily.   clobetasol ointment 0.05 % Commonly known as: TEMOVATE as needed.   diphenhydrAMINE 25 MG tablet Commonly known as: BENADRYL Take 25 mg by mouth as needed for sleep.   escitalopram 10 MG tablet Commonly known as: LEXAPRO Take 1 tablet (10 mg total) by mouth daily.   ezetimibe 10 MG tablet Commonly known as: ZETIA Take 1 tablet (10 mg total) by mouth daily.   furosemide 40 MG tablet Commonly known as: LASIX Take 1 tablet (40 mg total) by mouth daily.   hydrOXYzine 25 MG tablet Commonly known as: ATARAX Take 25 mg by mouth at bedtime.   imipramine 50 MG tablet Commonly known as: TOFRANIL Take 50 mg by mouth at bedtime.   ipratropium 0.06 % nasal spray Commonly known as: ATROVENT Place 2 sprays into the nose 4 (four) times daily.   ketoconazole 2 % cream Commonly known as: NIZORAL Apply 1 application topically daily.   levocetirizine 5 MG tablet Commonly known as: XYZAL Take 5 mg by mouth as needed for allergies.   levothyroxine 112 MCG tablet Commonly known as: SYNTHROID TAKE ONE TABLET BY MOUTH ONCE DAILY   mometasone 0.1 % ointment Commonly known as: ELOCON Apply topically as needed.   nitroGLYCERIN 0.4 MG SL tablet Commonly known as: NITROSTAT as needed.   omeprazole 20 MG capsule Commonly known as: PRILOSEC Take 20 mg by mouth daily.   Potassium  Chloride ER 20 MEQ Tbcr 1 tablet with food   rosuvastatin 20 MG tablet Commonly known as: CRESTOR Take 1 tablet (20 mg total) by mouth daily.   tacrolimus 0.1 % ointment Commonly known as: PROTOPIC Apply topically as needed.   Yupelri 175 MCG/3ML nebulizer solution Generic drug: revefenacin Take 3 mLs (175 mcg total) by nebulization daily.          Follow-up Information     Kimberly Huddle, MD Follow up in 5 day(s).   Specialty: Internal Medicine Why: post hospitalization follow up Contact information: [address] [place] [address] [phone]         Candee Furbish  C, MD Follow up.   Specialty: Cardiology Contact information: [address] [address] [phone]                 TOTAL DISCHARGE TIME: 35 minutes  [place]  [facility] Pager on [email or web address]  01/25/2023, 11:11 AM

## 2023-01-24 NOTE — Progress Notes (Signed)
OT Cancellation Note  Patient Details Name: Kimberly Waters MRN: [id number] DOB: 02/13/1946   Cancelled Treatment:    Reason Eval/Treat Not Completed: Patient at procedure or test/ unavailable (MRI)  Elliot Cousin 01/24/2023, 9:28 AM

## 2023-01-24 NOTE — Evaluation (Signed)
Occupational Therapy Evaluation Patient Details Name: Kimberly Waters MRN: [id number] DOB: 02-Mar-1946 Today's Date: 01/24/2023   History of Present Illness 77 y.o. female presents to [facility] hospital on 01/23/2023 after 3 days of recurrent transient losses of consciousness associated with HA and visual hallucinations. CTA and MRI negative. PMH includes breast cancer, HLD, hypothyroidism, COPD, CHF, CKD III.   Clinical Impression   Kimberly Waters was evaluated s/p the above admission list, she is typically mod I at baseline and lives with her husband who can assist at d/c. Upon evaluation she was limited by impulsivity and poor safety awareness. Overall she needed min G for mobility and ADLs with verbal cues for rollator management and safety, this is likely her functional baseline. Pt does not have acute OT needs. Recommend d/c to home without follow up needed.   Of note, pt reported that she attributes her hallucinations to "overdosing" on over the counter sleeping medications, reporting that she has been taking Tylenol PM and benadryl when she cannot sleep.    Recommendations for follow up therapy are one component of a multi-disciplinary discharge planning process, led by the attending physician.  Recommendations may be updated based on patient status, additional functional criteria and insurance authorization.   Follow Up Recommendations  No OT follow up     Assistance Recommended at Discharge Intermittent Supervision/Assistance  Patient can return home with the following A little help with walking and/or transfers;A little help with bathing/dressing/bathroom;Assist for transportation;Assistance with cooking/housework;Direct supervision/assist for medications management;Direct supervision/assist for financial management;Help with stairs or ramp for entrance    Functional Status Assessment  Patient has had a recent decline in their functional status and demonstrates the ability to make significant  improvements in function in a reasonable and predictable amount of time.  Equipment Recommendations  None recommended by OT    Recommendations for Other Services       Precautions / Restrictions Precautions Precautions: Fall Restrictions Weight Bearing Restrictions: No      Mobility Bed Mobility Overal bed mobility: Needs Assistance Bed Mobility: Supine to Sit, Sit to Supine     Supine to sit: Min assist Sit to supine: Supervision        Transfers Overall transfer level: Needs assistance Equipment used: Rollator (4 wheels) Transfers: Sit to/from Stand Sit to Stand: Min guard                  Balance Overall balance assessment: Mild deficits observed, not formally tested                                         ADL either performed or assessed with clinical judgement   ADL Overall ADL's : Needs assistance/impaired Eating/Feeding: Independent   Grooming: Supervision/safety;Standing   Upper Body Bathing: Set up   Lower Body Bathing: Min guard;Sit to/from stand   Upper Body Dressing : Set up;Sitting   Lower Body Dressing: Min guard;Sit to/from stand   Toilet Transfer: Min guard;Ambulation;Rollator (4 wheels)   Toileting- Clothing Manipulation and Hygiene: Independent;Sitting/lateral lean       Functional mobility during ADLs: Min guard;Rollator (4 wheels) General ADL Comments: impulsive, poor rollator management     Vision Baseline Vision/History: 1 Wears glasses Vision Assessment?: No apparent visual deficits     Perception Perception Perception Tested?: No   Praxis Praxis Praxis tested?: Within functional limits    Pertinent Vitals/Pain Pain Assessment Pain Assessment: No/denies  pain     Hand Dominance Right   Extremity/Trunk Assessment Upper Extremity Assessment Upper Extremity Assessment: Generalized weakness   Lower Extremity Assessment Lower Extremity Assessment: Defer to PT evaluation   Cervical / Trunk  Assessment Cervical / Trunk Assessment: Normal   Communication Communication Communication: No difficulties   Cognition Arousal/Alertness: Awake/alert Behavior During Therapy: Impulsive Overall Cognitive Status: No family/caregiver present to determine baseline cognitive functioning                                 General Comments: Impulsive, verbose, self distracting, impiared attention. Pt is attributing hallucinations to medication, saying she might've "overdoesed" on tylenol PM/Benadryl or lack of O2 when lying flat.     General Comments  VSS on RA.    Exercises     Shoulder Instructions      Home Living Family/patient expects to be discharged to:: Private residence Living Arrangements: Spouse/significant other Available Help at Discharge: Available 24 hours/day Type of Home: House Home Access: Level entry   Entrance Stairs-Rails: Left;Right       Bathroom Shower/Tub: Occupational psychologist: Standard     Home Equipment: Rollator (4 wheels);Cane - single point;Shower seat;Grab bars - toilet          Prior Functioning/Environment Prior Level of Function : Independent/Modified Independent;History of Falls (last six months)             Mobility Comments: rollator all times ADLs Comments: indep, drives        OT Problem List: Decreased range of motion;Decreased strength;Decreased activity tolerance;Impaired balance (sitting and/or standing);Decreased cognition;Decreased safety awareness;Decreased knowledge of use of DME or AE;Decreased knowledge of precautions         OT Goals(Current goals can be found in the care plan section) Acute Rehab OT Goals Patient Stated Goal: home OT Goal Formulation: With patient Time For Goal Achievement: 02/07/23 Potential to Achieve Goals: Good   AM-PAC OT "6 Clicks" Daily Activity     Outcome Measure Help from another person eating meals?: None Help from another person taking care of personal  grooming?: A Little Help from another person toileting, which includes using toliet, bedpan, or urinal?: A Little Help from another person bathing (including washing, rinsing, drying)?: A Little Help from another person to put on and taking off regular upper body clothing?: None Help from another person to put on and taking off regular lower body clothing?: A Little 6 Click Score: 20   End of Session Equipment Utilized During Treatment: Rollator (4 wheels) Nurse Communication: Mobility status  Activity Tolerance: Patient tolerated treatment well Patient left: in bed;with call bell/phone within reach  OT Visit Diagnosis: Unsteadiness on feet (R26.81);Other abnormalities of gait and mobility (R26.89);Muscle weakness (generalized) (M62.81)                Time: 1031-1050 OT Time Calculation (min): 19 min Charges:  OT General Charges $OT Visit: 1 Visit OT Evaluation $OT Eval Moderate Complexity: 1 Mod    Matison Nuccio D Causey 01/24/2023, 10:59 AM

## 2023-01-24 NOTE — Evaluation (Signed)
Physical Therapy Evaluation Patient Details Name: Kimberly Waters MRN: [id number] DOB: [birth year] Today's Date: 01/24/2023  History of Present Illness  77 y.o. female presents to [facility] hospital on 01/23/2023 after 3 days of recurrent transient losses of consciousness associated with HA and visual hallucinations. CTA and MRI negative. PMH includes breast cancer, HLD, hypothyroidism, COPD, CHF, CKD III.  Clinical Impression  Pt presents to PT at or near her baseline level, ambulatory with support of rollator. Pt denies any symptoms at this time, associating them with sleeping medications. Pt does not require further acute PT services at this time. PT recommends discharge home when medically ready.       Recommendations for follow up therapy are one component of a multi-disciplinary discharge planning process, led by the attending physician.  Recommendations may be updated based on patient status, additional functional criteria and insurance authorization.  Follow Up Recommendations No PT follow up      Assistance Recommended at Discharge PRN  Patient can return home with the following  Assistance with cooking/housework;Help with stairs or ramp for entrance    Equipment Recommendations None recommended by PT  Recommendations for Other Services       Functional Status Assessment Patient has not had a recent decline in their functional status     Precautions / Restrictions Precautions Precautions: Fall Restrictions Weight Bearing Restrictions: No      Mobility  Bed Mobility Overal bed mobility: Modified Independent Bed Mobility: Supine to Sit, Sit to Supine     Supine to sit: Modified independent (Device/Increase time) Sit to supine: Modified independent (Device/Increase time)   General bed mobility comments: increased time    Transfers Overall transfer level: Needs assistance Equipment used: None Transfers: Sit to/from Stand Sit to Stand: Supervision                 Ambulation/Gait Ambulation/Gait assistance: Modified independent (Device/Increase time) Gait Distance (Feet): 250 Feet Assistive device: Rollator (4 wheels) Gait Pattern/deviations: Step-through pattern Gait velocity: functional Gait velocity interpretation: 1.31 - 2.62 ft/sec, indicative of limited community ambulator   General Gait Details: teady step-through gait with support of rollator  Stairs            Wheelchair Mobility    Modified Rankin (Stroke Patients Only)       Balance Overall balance assessment: Mild deficits observed, not formally tested                                           Pertinent Vitals/Pain Pain Assessment Pain Assessment: No/denies pain    Home Living Family/patient expects to be discharged to:: Private residence Living Arrangements: Spouse/significant other Available Help at Discharge: Available 24 hours/day Type of Home: House Home Access: Level entry (enters home through basement) Entrance Stairs-Rails: Left;Right   Alternate Level Stairs-Number of Steps: flight Home Layout: Multi-level;Laundry or work area in [employer]: [place] (4 wheels);Cane - single point;Shower seat;Grab bars - toilet;BSC/3in1      Prior Function Prior Level of Function : Independent/Modified Independent;History of Falls (last six months)             Mobility Comments: rollator for most ambulation, only without DME when ambulating across basement to car ADLs Comments: indep, drives     Hand Dominance   Dominant Hand: Right    Extremity/Trunk Assessment   Upper Extremity Assessment Upper Extremity Assessment: Defer to OT evaluation  Lower Extremity Assessment Lower Extremity Assessment: Overall WFL for tasks assessed    Cervical / Trunk Assessment Cervical / Trunk Assessment: Normal  Communication   Communication: No difficulties  Cognition Arousal/Alertness: Awake/alert Behavior During Therapy: WFL  for tasks assessed/performed Overall Cognitive Status: No family/caregiver present to determine baseline cognitive functioning                                 General Comments: appropriate for session        General Comments General comments (skin integrity, edema, etc.): VSS on RA    Exercises     Assessment/Plan    PT Assessment Patient does not need any further PT services  PT Problem List         PT Treatment Interventions      PT Goals (Current goals can be found in the Care Plan section)       Frequency       Co-evaluation               AM-PAC PT "6 Clicks" Mobility  Outcome Measure Help needed turning from your back to your side while in a flat bed without using bedrails?: None Help needed moving from lying on your back to sitting on the side of a flat bed without using bedrails?: None Help needed moving to and from a bed to a chair (including a wheelchair)?: A Little Help needed standing up from a chair using your arms (e.g., wheelchair or bedside chair)?: A Little Help needed to walk in hospital room?: None Help needed climbing 3-5 steps with a railing? : A Little 6 Click Score: 21    End of Session   Activity Tolerance: Patient tolerated treatment well Patient left: in bed;with call bell/phone within reach;with family/visitor present Nurse Communication: Mobility status PT Visit Diagnosis: Other abnormalities of gait and mobility (R26.89)    Time: 4315-4008 PT Time Calculation (min) (ACUTE ONLY): 26 min   Charges:   PT Evaluation $PT Eval Low Complexity: [place], PT, DPT Acute Rehabilitation Office [phone]   Zenaida Niece 01/24/2023, 12:04 PM

## 2023-01-24 NOTE — ED Notes (Signed)
Patient to MRI at this time.

## 2023-01-24 NOTE — Procedures (Signed)
Patient Name: KIRAT MEZQUITA  MRN: [id number]  Epilepsy Attending: Lora Havens  Referring Physician/Provider: Vianne Bulls, MD  Date: 01/23/2023 Duration: 28.59 mins  Patient history: 77 year old female with transient loss of consciousness.  EEG to evaluate for seizure.  Level of alertness: Awake  AEDs during EEG study: None  Technical aspects: This EEG study was done with scalp electrodes positioned according to the 10-20 International system of electrode placement. Electrical activity was reviewed with band pass filter of 1-'70Hz'$ , sensitivity of 7 uV/mm, display speed of 46m/sec with a '60Hz'$  notched filter applied as appropriate. EEG data were recorded continuously and digitally stored.  Video monitoring was available and reviewed as appropriate.  Description: The posterior dominant rhythm consists of 9-10 Hz activity of moderate voltage (25-35 uV) seen predominantly in posterior head regions, symmetric and reactive to eye opening and eye closing. Hyperventilation did not show any EEG change.  Physiologic photic driving was not seen during photic stimulation.   IMPRESSION: This study is within normal limits. No seizures or epileptiform discharges were seen throughout the recording.  A normal interictal EEG does not exclude the diagnosis of epilepsy.  Ellanie Oppedisano OBarbra Sarks

## 2023-01-24 NOTE — Progress Notes (Signed)
  Echocardiogram 2D Echocardiogram has been performed.  Darlina Sicilian M 01/24/2023, 11:44 AM

## 2023-01-24 NOTE — Progress Notes (Signed)
EEG complete - results pending 

## 2023-02-01 ENCOUNTER — Emergency Department (HOSPITAL_COMMUNITY): Payer: Medicare Other

## 2023-02-01 ENCOUNTER — Other Ambulatory Visit: Payer: Self-pay

## 2023-02-01 ENCOUNTER — Inpatient Hospital Stay (HOSPITAL_COMMUNITY)
Admission: EM | Admit: 2023-02-01 | Discharge: 2023-02-09 | DRG: 287 | Disposition: A | Discharge status: Home / Self Care | Payer: Medicare Other | Attending: Cardiology | Admitting: Cardiology

## 2023-02-01 ENCOUNTER — Telehealth: Payer: Self-pay | Admitting: Cardiology

## 2023-02-01 ENCOUNTER — Inpatient Hospital Stay (HOSPITAL_COMMUNITY): Payer: Medicare Other

## 2023-02-01 DIAGNOSIS — J449 Chronic obstructive pulmonary disease, unspecified: Secondary | ICD-10-CM | POA: Diagnosis present

## 2023-02-01 DIAGNOSIS — W19XXXA Unspecified fall, initial encounter: Secondary | ICD-10-CM | POA: Diagnosis not present

## 2023-02-01 DIAGNOSIS — E876 Hypokalemia: Secondary | ICD-10-CM | POA: Diagnosis present

## 2023-02-01 DIAGNOSIS — Z853 Personal history of malignant neoplasm of breast: Secondary | ICD-10-CM | POA: Diagnosis not present

## 2023-02-01 DIAGNOSIS — Z79890 Hormone replacement therapy (postmenopausal): Secondary | ICD-10-CM | POA: Diagnosis not present

## 2023-02-01 DIAGNOSIS — Z79811 Long term (current) use of aromatase inhibitors: Secondary | ICD-10-CM

## 2023-02-01 DIAGNOSIS — S0990XA Unspecified injury of head, initial encounter: Secondary | ICD-10-CM | POA: Diagnosis not present

## 2023-02-01 DIAGNOSIS — I4729 Other ventricular tachycardia: Secondary | ICD-10-CM | POA: Diagnosis not present

## 2023-02-01 DIAGNOSIS — E785 Hyperlipidemia, unspecified: Secondary | ICD-10-CM | POA: Diagnosis present

## 2023-02-01 DIAGNOSIS — N1832 Chronic kidney disease, stage 3b: Secondary | ICD-10-CM | POA: Diagnosis present

## 2023-02-01 DIAGNOSIS — I130 Hypertensive heart and chronic kidney disease with heart failure and stage 1 through stage 4 chronic kidney disease, or unspecified chronic kidney disease: Secondary | ICD-10-CM | POA: Diagnosis present

## 2023-02-01 DIAGNOSIS — Z90710 Acquired absence of both cervix and uterus: Secondary | ICD-10-CM

## 2023-02-01 DIAGNOSIS — I5032 Chronic diastolic (congestive) heart failure: Secondary | ICD-10-CM | POA: Diagnosis not present

## 2023-02-01 DIAGNOSIS — Z23 Encounter for immunization: Secondary | ICD-10-CM | POA: Diagnosis not present

## 2023-02-01 DIAGNOSIS — F32A Depression, unspecified: Secondary | ICD-10-CM | POA: Diagnosis not present

## 2023-02-01 DIAGNOSIS — Z923 Personal history of irradiation: Secondary | ICD-10-CM

## 2023-02-01 DIAGNOSIS — Z9013 Acquired absence of bilateral breasts and nipples: Secondary | ICD-10-CM | POA: Diagnosis not present

## 2023-02-01 DIAGNOSIS — I4721 Torsades de pointes: Secondary | ICD-10-CM | POA: Insufficient documentation

## 2023-02-01 DIAGNOSIS — I10 Essential (primary) hypertension: Secondary | ICD-10-CM | POA: Diagnosis not present

## 2023-02-01 DIAGNOSIS — G4489 Other headache syndrome: Secondary | ICD-10-CM | POA: Diagnosis not present

## 2023-02-01 DIAGNOSIS — Z8249 Family history of ischemic heart disease and other diseases of the circulatory system: Secondary | ICD-10-CM | POA: Diagnosis not present

## 2023-02-01 DIAGNOSIS — Z79899 Other long term (current) drug therapy: Secondary | ICD-10-CM

## 2023-02-01 DIAGNOSIS — R001 Bradycardia, unspecified: Secondary | ICD-10-CM | POA: Diagnosis not present

## 2023-02-01 DIAGNOSIS — Z803 Family history of malignant neoplasm of breast: Secondary | ICD-10-CM

## 2023-02-01 DIAGNOSIS — I2510 Atherosclerotic heart disease of native coronary artery without angina pectoris: Secondary | ICD-10-CM | POA: Diagnosis present

## 2023-02-01 DIAGNOSIS — E039 Hypothyroidism, unspecified: Secondary | ICD-10-CM | POA: Diagnosis present

## 2023-02-01 DIAGNOSIS — I6782 Cerebral ischemia: Secondary | ICD-10-CM | POA: Diagnosis not present

## 2023-02-01 DIAGNOSIS — I472 Ventricular tachycardia, unspecified: Secondary | ICD-10-CM | POA: Diagnosis not present

## 2023-02-01 DIAGNOSIS — R519 Headache, unspecified: Secondary | ICD-10-CM | POA: Diagnosis not present

## 2023-02-01 DIAGNOSIS — I447 Left bundle-branch block, unspecified: Secondary | ICD-10-CM | POA: Diagnosis present

## 2023-02-01 DIAGNOSIS — I4581 Long QT syndrome: Secondary | ICD-10-CM | POA: Diagnosis present

## 2023-02-01 DIAGNOSIS — Z885 Allergy status to narcotic agent status: Secondary | ICD-10-CM

## 2023-02-01 DIAGNOSIS — Z9109 Other allergy status, other than to drugs and biological substances: Secondary | ICD-10-CM

## 2023-02-01 DIAGNOSIS — R55 Syncope and collapse: Secondary | ICD-10-CM | POA: Diagnosis not present

## 2023-02-01 DIAGNOSIS — I700 Atherosclerosis of aorta: Secondary | ICD-10-CM | POA: Diagnosis present

## 2023-02-01 DIAGNOSIS — Z91040 Latex allergy status: Secondary | ICD-10-CM

## 2023-02-01 DIAGNOSIS — Z884 Allergy status to anesthetic agent status: Secondary | ICD-10-CM

## 2023-02-01 DIAGNOSIS — K219 Gastro-esophageal reflux disease without esophagitis: Secondary | ICD-10-CM | POA: Diagnosis present

## 2023-02-01 DIAGNOSIS — Z1152 Encounter for screening for COVID-19: Secondary | ICD-10-CM | POA: Diagnosis not present

## 2023-02-01 DIAGNOSIS — Z86010 Personal history of colonic polyps: Secondary | ICD-10-CM

## 2023-02-01 DIAGNOSIS — H3530 Unspecified macular degeneration: Secondary | ICD-10-CM | POA: Diagnosis present

## 2023-02-01 DIAGNOSIS — I471 Supraventricular tachycardia, unspecified: Secondary | ICD-10-CM | POA: Diagnosis not present

## 2023-02-01 DIAGNOSIS — Z87891 Personal history of nicotine dependence: Secondary | ICD-10-CM | POA: Diagnosis not present

## 2023-02-01 DIAGNOSIS — R0602 Shortness of breath: Secondary | ICD-10-CM | POA: Diagnosis not present

## 2023-02-01 LAB — PHOSPHORUS: Phosphorus: 3.2 mg/dL (ref 2.5–4.6)

## 2023-02-01 LAB — CBC
HCT: 40.4 % (ref 36.0–46.0)
HCT: 40.9 % (ref 36.0–46.0)
Hemoglobin: 13.5 g/dL (ref 12.0–15.0)
Hemoglobin: 13.1 g/dL (ref 12.0–15.0)
MCH: 29.6 pg (ref 26.0–34.0)
MCH: 29 pg (ref 26.0–34.0)
MCHC: 33.4 g/dL (ref 30.0–36.0)
MCHC: 32 g/dL (ref 30.0–36.0)
MCV: 88.6 fL (ref 80.0–100.0)
MCV: 90.5 fL (ref 80.0–100.0)
Platelets: 336 10*3/uL (ref 150–400)
Platelets: 243 10*3/uL (ref 150–400)
RBC: 4.56 MIL/uL (ref 3.87–5.11)
RBC: 4.52 MIL/uL (ref 3.87–5.11)
RDW: 14.4 % (ref 11.5–15.5)
RDW: 14.6 % (ref 11.5–15.5)
WBC: 11.3 10*3/uL — ABNORMAL HIGH (ref 4.0–10.5)
WBC: 10.6 10*3/uL — ABNORMAL HIGH (ref 4.0–10.5)
nRBC: 0 % (ref 0.0–0.2)
nRBC: 0 % (ref 0.0–0.2)

## 2023-02-01 LAB — BASIC METABOLIC PANEL
Anion gap: 13 (ref 5–15)
BUN: 16 mg/dL (ref 8–23)
CO2: 28 mmol/L (ref 22–32)
Calcium: 9.2 mg/dL (ref 8.9–10.3)
Chloride: 97 mmol/L — ABNORMAL LOW (ref 98–111)
Creatinine, Ser: 1.61 mg/dL — ABNORMAL HIGH (ref 0.44–1.00)
GFR, Estimated: 33 mL/min — ABNORMAL LOW (ref >60)
Glucose, Bld: 117 mg/dL — ABNORMAL HIGH (ref 70–99)
Potassium: 2.8 mmol/L — ABNORMAL LOW (ref 3.5–5.1)
Sodium: 138 mmol/L (ref 135–145)

## 2023-02-01 LAB — TSH: TSH: 66.43 u[IU]/mL — ABNORMAL HIGH (ref 0.350–4.500)

## 2023-02-01 LAB — TROPONIN I (HIGH SENSITIVITY)
Troponin I (High Sensitivity): 21 ng/L — ABNORMAL HIGH (ref <18)
Troponin I (High Sensitivity): 19 ng/L — ABNORMAL HIGH (ref <18)

## 2023-02-01 LAB — MAGNESIUM: Magnesium: 2 mg/dL (ref 1.7–2.4)

## 2023-02-01 LAB — RESP PANEL BY RT-PCR (RSV, FLU A&B, COVID)  RVPGX2
Influenza A by PCR: NEGATIVE
Influenza B by PCR: NEGATIVE
Resp Syncytial Virus by PCR: NEGATIVE
SARS Coronavirus 2 by RT PCR: NEGATIVE

## 2023-02-01 LAB — CREATININE, SERUM
Creatinine, Ser: 1.62 mg/dL — ABNORMAL HIGH (ref 0.44–1.00)
GFR, Estimated: 33 mL/min — ABNORMAL LOW (ref >60)

## 2023-02-01 LAB — BRAIN NATRIURETIC PEPTIDE
B Natriuretic Peptide: 45.9 pg/mL (ref 0.0–100.0)
B Natriuretic Peptide: 44.8 pg/mL (ref 0.0–100.0)

## 2023-02-01 LAB — T4, FREE: Free T4: <0.25 ng/dL — ABNORMAL LOW (ref 0.61–1.12)

## 2023-02-01 MED ORDER — INFLUENZA VAC A&B SA ADJ QUAD 0.5 ML IM PRSY
0.5000 mL | PREFILLED_SYRINGE | INTRAMUSCULAR | Status: AC
  Administered 2023-02-02: 0.5 mL via INTRAMUSCULAR
  Filled 2023-02-01: qty 0.5

## 2023-02-01 MED ORDER — AMIODARONE HCL IN DEXTROSE 360-4.14 MG/200ML-% IV SOLN
30.0000 mg/h | INTRAVENOUS | Status: DC
  Administered 2023-02-02 (×2): 30 mg/h via INTRAVENOUS
  Filled 2023-02-01 (×3): qty 200

## 2023-02-01 MED ORDER — ROSUVASTATIN CALCIUM 20 MG PO TABS
20.0000 mg | ORAL_TABLET | Freq: Every day | ORAL | Status: DC
  Administered 2023-02-01 – 2023-02-09 (×9): 20 mg via ORAL
  Filled 2023-02-01 (×9): qty 1

## 2023-02-01 MED ORDER — AMIODARONE LOAD VIA INFUSION
150.0000 mg | Freq: Once | INTRAVENOUS | Status: AC
  Administered 2023-02-01: 150 mg via INTRAVENOUS
  Filled 2023-02-01: qty 83.34

## 2023-02-01 MED ORDER — EZETIMIBE 10 MG PO TABS
10.0000 mg | ORAL_TABLET | Freq: Every day | ORAL | Status: DC
  Administered 2023-02-01 – 2023-02-09 (×9): 10 mg via ORAL
  Filled 2023-02-01 (×9): qty 1

## 2023-02-01 MED ORDER — BUPROPION HCL ER (SR) 150 MG PO TB12
150.0000 mg | ORAL_TABLET | Freq: Every day | ORAL | Status: DC
  Administered 2023-02-02 – 2023-02-09 (×8): 150 mg via ORAL
  Filled 2023-02-01 (×8): qty 1

## 2023-02-01 MED ORDER — FUROSEMIDE 40 MG PO TABS
40.0000 mg | ORAL_TABLET | Freq: Every day | ORAL | Status: DC
  Administered 2023-02-01 – 2023-02-09 (×9): 40 mg via ORAL
  Filled 2023-02-01 (×9): qty 1

## 2023-02-01 MED ORDER — SODIUM CHLORIDE 0.9% FLUSH
3.0000 mL | Freq: Two times a day (BID) | INTRAVENOUS | Status: DC
  Administered 2023-02-03 – 2023-02-09 (×11): 3 mL via INTRAVENOUS

## 2023-02-01 MED ORDER — LEVOTHYROXINE SODIUM 112 MCG PO TABS
112.0000 ug | ORAL_TABLET | Freq: Every day | ORAL | Status: DC
  Administered 2023-02-02 – 2023-02-03 (×2): 112 ug via ORAL
  Filled 2023-02-01 (×2): qty 1

## 2023-02-01 MED ORDER — HEPARIN SODIUM (PORCINE) 5000 UNIT/ML IJ SOLN
5000.0000 [IU] | Freq: Three times a day (TID) | INTRAMUSCULAR | Status: DC
  Administered 2023-02-01 – 2023-02-09 (×24): 5000 [IU] via SUBCUTANEOUS
  Filled 2023-02-01 (×24): qty 1

## 2023-02-01 MED ORDER — ESCITALOPRAM OXALATE 10 MG PO TABS
10.0000 mg | ORAL_TABLET | Freq: Every day | ORAL | Status: DC
  Administered 2023-02-01 – 2023-02-09 (×9): 10 mg via ORAL
  Filled 2023-02-01 (×9): qty 1

## 2023-02-01 MED ORDER — MAGNESIUM SULFATE 2 GM/50ML IV SOLN
2.0000 g | Freq: Once | INTRAVENOUS | Status: AC
  Administered 2023-02-01: 2 g via INTRAVENOUS
  Filled 2023-02-01: qty 50

## 2023-02-01 MED ORDER — ARFORMOTEROL TARTRATE 15 MCG/2ML IN NEBU
15.0000 ug | INHALATION_SOLUTION | Freq: Two times a day (BID) | RESPIRATORY_TRACT | Status: DC
  Administered 2023-02-01 – 2023-02-09 (×16): 15 ug via RESPIRATORY_TRACT
  Filled 2023-02-01 (×16): qty 2

## 2023-02-01 MED ORDER — KETOROLAC TROMETHAMINE 15 MG/ML IJ SOLN
15.0000 mg | Freq: Once | INTRAMUSCULAR | Status: DC
  Filled 2023-02-01: qty 1

## 2023-02-01 MED ORDER — METOPROLOL TARTRATE 12.5 MG HALF TABLET
12.5000 mg | ORAL_TABLET | Freq: Two times a day (BID) | ORAL | Status: DC
  Administered 2023-02-01: 12.5 mg via ORAL
  Filled 2023-02-01 (×2): qty 1

## 2023-02-01 MED ORDER — HYDROXYZINE HCL 25 MG PO TABS
25.0000 mg | ORAL_TABLET | Freq: Every day | ORAL | Status: DC
  Administered 2023-02-01 – 2023-02-08 (×8): 25 mg via ORAL
  Filled 2023-02-01 (×8): qty 1

## 2023-02-01 MED ORDER — POTASSIUM CHLORIDE 10 MEQ/100ML IV SOLN
10.0000 meq | INTRAVENOUS | Status: AC
  Administered 2023-02-01 (×3): 10 meq via INTRAVENOUS
  Filled 2023-02-01 (×3): qty 100

## 2023-02-01 MED ORDER — ASPIRIN 81 MG PO TBEC
81.0000 mg | DELAYED_RELEASE_TABLET | Freq: Every day | ORAL | Status: DC
  Administered 2023-02-01 – 2023-02-09 (×9): 81 mg via ORAL
  Filled 2023-02-01 (×9): qty 1

## 2023-02-01 MED ORDER — AMIODARONE HCL IN DEXTROSE 360-4.14 MG/200ML-% IV SOLN
60.0000 mg/h | INTRAVENOUS | Status: DC
  Administered 2023-02-01 (×2): 60 mg/h via INTRAVENOUS

## 2023-02-01 MED ORDER — TRIMETHOBENZAMIDE HCL 100 MG/ML IM SOLN
200.0000 mg | Freq: Once | INTRAMUSCULAR | Status: DC
  Filled 2023-02-01: qty 2

## 2023-02-01 NOTE — ED Triage Notes (Signed)
Pt BIB EMS from home, originally called for SOB but then told EMS she fell last night and hit her head and think she lost consciousness. C/o headache today and says she is "no longer short of breath, I just can't breathe". Aox4. Walks with walker at home

## 2023-02-01 NOTE — ED Notes (Signed)
ED TO INPATIENT HANDOFF REPORT  ED Nurse Name and Phone #:   S Name/Age/Gender Kimberly Waters 77 y.o. female Room/Bed: 034C/034C  Code Status   Code Status: Full Code  Home/SNF/Other Home Patient oriented to: self, place, time, and situation Is this baseline? Yes   Triage Complete: Triage complete  Chief Complaint Ventricular tachycardia ([place]) [I47.20]  Triage Note Pt BIB EMS from home, originally called for SOB but then told EMS she fell last night and hit her head and think she lost consciousness. C/o headache today and says she is "no longer short of breath, I just can't breathe". Aox4. Walks with walker at home   Allergies Allergies  Allergen Reactions   Latex Other (See Comments)    Other reaction(s): Other (See Comments) Tears skin.   Adhesive [Tape] Other (See Comments)    (skin tears)  Tolerates PAPER TAPE   Codeine Nausea Only   Other Nausea Only    Anesthesia--nausea     Level of Care/Admitting Diagnosis ED Disposition     ED Disposition  Admit   Condition  --   Comment  Hospital Area: [place] [[id number]]  Level of Care: Telemetry Cardiac [[id number]]  May admit patient to [facility] or [facility] if equivalent level of care is available:: No  Covid Evaluation: Asymptomatic - no recent exposure (last 10 days) testing not required  Diagnosis: Ventricular tachycardia [facility]) [[id number]]  Admitting Physician: Berniece Salines [[id number]]  Attending Physician: Berniece Salines [[id number]]  Certification:: I certify this patient will need inpatient services for at least 2 midnights  Estimated Length of Stay: 2          B Medical/Surgery History Past Medical History:  Diagnosis Date   Allergy    Breast cancer ([place])    Cataract    Colon polyps    Depression    Dyspnea    SEASONAL   Family history of breast cancer    Family history of colon cancer    GERD (gastroesophageal reflux disease)    History of radiation therapy 06/05/17-07/20/17    right chest wall 50.4 Gy in 28 fractions, right axillary nodal region 45 Gy in 25 fractions   Hyperlipidemia    Hypertension    Hypertensive retinopathy    Hypothyroidism    Macular degeneration    Peripheral neuropathy 06/18/2019   PONV (postoperative nausea and vomiting)    Past Surgical History:  Procedure Laterality Date   ABDOMINAL HYSTERECTOMY     KNEE SURGERY Right    MASTECTOMY W/ SENTINEL NODE BIOPSY Right 03/05/2017   Procedure: BILATERAL TOTAL MASTECTOMIES WITH RIGHT SENTINEL LYMPH NODE BIOPSIES;  Surgeon: Excell Seltzer, MD;  Location: [place] OR;  Service: General;  Laterality: Right;   SHOULDER SURGERY     BILAT     A IV Location/Drains/Wounds Patient Lines/Drains/Airways Status     Active Line/Drains/Airways     Name Placement date Placement time Site Days   Peripheral IV 02/01/23 22 G Anterior;Left;Proximal Forearm 02/01/23  1627  Forearm  less than 1   Closed System Drain 1 Left;Lateral Chest Bulb (JP) 19 Fr. 03/05/17  --  Chest  2159   Closed System Drain 2 Right;Lateral Chest Bulb (JP) 19 Fr. 03/05/17  --  Chest  2159            Intake/Output Last 24 hours No intake or output data in the 24 hours ending 02/01/23 1817  Labs/Imaging Results for orders placed or performed during the hospital encounter of 02/01/23 (  from the past 48 hour(s))  Resp panel by RT-PCR (RSV, Flu A&B, Covid) Anterior Nasal Swab     Status: None   Collection Time: 02/01/23  2:11 PM   Specimen: Anterior Nasal Swab  Result Value Ref Range   SARS Coronavirus 2 by RT PCR NEGATIVE NEGATIVE   Influenza A by PCR NEGATIVE NEGATIVE   Influenza B by PCR NEGATIVE NEGATIVE    Comment: (NOTE) The Xpert Xpress SARS-CoV-2/FLU/RSV plus assay is intended as an aid in the diagnosis of influenza from Nasopharyngeal swab specimens and should not be used as a sole basis for treatment. Nasal washings and aspirates are unacceptable for Xpert Xpress SARS-CoV-2/FLU/RSV testing.  Fact Sheet for  Patients: EntrepreneurPulse.com.au  Fact Sheet for Healthcare Providers: IncredibleEmployment.be  This test is not yet approved or cleared by the [place] FDA and has been authorized for detection and/or diagnosis of SARS-CoV-2 by FDA under an Emergency Use Authorization (EUA). This EUA will remain in effect (meaning this test can be used) for the duration of the COVID-19 declaration under Section 564(b)(1) of the Act, 21 U.S.C. section 360bbb-3(b)(1), unless the authorization is terminated or revoked.     Resp Syncytial Virus by PCR NEGATIVE NEGATIVE    Comment: (NOTE) Fact Sheet for Patients: EntrepreneurPulse.com.au  Fact Sheet for Healthcare Providers: IncredibleEmployment.be  This test is not yet approved or cleared by the [place] FDA and has been authorized for detection and/or diagnosis of SARS-CoV-2 by FDA under an Emergency Use Authorization (EUA). This EUA will remain in effect (meaning this test can be used) for the duration of the COVID-19 declaration under Section 564(b)(1) of the Act, 21 U.S.C. section 360bbb-3(b)(1), unless the authorization is terminated or revoked.  Performed at [facility], [place] [address], [place], [place] [address]   Basic metabolic panel     Status: Abnormal   Collection Time: 02/01/23  3:53 PM  Result Value Ref Range   Sodium 138 135 - 145 mmol/L   Potassium 2.8 (L) 3.5 - 5.1 mmol/L   Chloride 97 (L) 98 - 111 mmol/L   CO2 28 22 - 32 mmol/L   Glucose, Bld 117 (H) 70 - 99 mg/dL    Comment: Glucose reference range applies only to samples taken after fasting for at least 8 hours.   BUN 16 8 - 23 mg/dL   Creatinine, Ser 1.61 (H) 0.44 - 1.00 mg/dL   Calcium 9.2 8.9 - 10.3 mg/dL   GFR, Estimated 33 (L) >60 mL/min    Comment: (NOTE) Calculated using the CKD-EPI Creatinine Equation (2021)    Anion gap 13 5 - 15    Comment: Performed at [facility] [address] [place], [place] [address]  CBC     Status: Abnormal   Collection Time: 02/01/23  3:53 PM  Result Value Ref Range   WBC 11.3 (H) 4.0 - 10.5 K/uL   RBC 4.56 3.87 - 5.11 MIL/uL   Hemoglobin 13.5 12.0 - 15.0 g/dL   HCT 40.4 36.0 - 46.0 %   MCV 88.6 80.0 - 100.0 fL   MCH 29.6 26.0 - 34.0 pg   MCHC 33.4 30.0 - 36.0 g/dL   RDW 14.4 11.5 - 15.5 %   Platelets 336 150 - 400 K/uL   nRBC 0.0 0.0 - 0.2 %    Comment: Performed at [facility], [place] [address], [place], [place] [address]  Troponin I (High Sensitivity)     Status: Abnormal   Collection  Time: 02/01/23  3:53 PM  Result Value Ref Range   Troponin I (High Sensitivity) 21 (H) <18 ng/L    Comment: (NOTE) Elevated high sensitivity troponin I (hsTnI) values and significant  changes across serial measurements may suggest ACS but many other  chronic and acute conditions are known to elevate hsTnI results.  Refer to the "Links" section for chest pain algorithms and additional  guidance. Performed at [facility], [place] [address], [place], [place] [address]   Brain natriuretic peptide     Status: None   Collection Time: 02/01/23  3:53 PM  Result Value Ref Range   B Natriuretic Peptide 45.9 0.0 - 100.0 pg/mL    Comment: Performed at [facility] [address] [place], [place] [address]   DG Chest 2 View  Result Date: 02/01/2023 CLINICAL DATA:  Shortness of breath.  Fall last night. EXAM: CHEST - 2 VIEW COMPARISON:  01/23/2023 FINDINGS: Lungs are adequately inflated without focal airspace consolidation, effusion or pneumothorax. Cardiomediastinal silhouette is normal. No acute fracture. IMPRESSION: No active cardiopulmonary disease. Electronically Signed   By: Marin Olp M.D.   On: 02/01/2023 14:41    Pending Labs Unresulted Labs (From admission, onward)     Start     Ordered   02/02/23 8295  Basic metabolic panel  Tomorrow morning,   R        02/01/23 1755   02/02/23 0500  CBC  Tomorrow  morning,   R        02/01/23 1755   02/01/23 1755  Brain natriuretic peptide  Once,   R        02/01/23 1755   02/01/23 1753  CBC  (heparin)  Once,   R       Comments: Baseline for heparin therapy IF NOT ALREADY DRAWN.  Notify MD if PLT < 100 K.    02/01/23 1755   02/01/23 1753  Creatinine, serum  (heparin)  Once,   R       Comments: Baseline for heparin therapy IF NOT ALREADY DRAWN.    02/01/23 1755   02/01/23 1710  Phosphorus  Once,   STAT        02/01/23 1709   02/01/23 1644  TSH  Once,   URGENT        02/01/23 1643   02/01/23 1644  T4, free  Once,   URGENT        02/01/23 1643   02/01/23 1642  Magnesium  Once,   STAT        02/01/23 1641            Vitals/Pain Today's Vitals   02/01/23 1325 02/01/23 1327 02/01/23 1328 02/01/23 1700  BP: (!) 161/83   (!) 133/92  Pulse: 64   64  Resp: 16   18  Temp: 98.1 F (36.7 C)   97.8 F (36.6 C)  TempSrc:    Oral  SpO2: 97%   100%  Weight:  85.3 kg    Height:  '5\' 2"'$  (1.575 m)    PainSc:   9      Isolation Precautions No active isolations  Medications Medications  ketorolac (TORADOL) 15 MG/ML injection 15 mg (has no administration in time range)  amiodarone (NEXTERONE) 1.8 mg/mL load via infusion 150 mg (150 mg Intravenous Bolus from Bag 02/01/23 1643)    Followed by  amiodarone (NEXTERONE PREMIX) 360-4.14 MG/200ML-% (1.8 mg/mL) IV infusion (60 mg/hr Intravenous New Bag/Given 02/01/23 1643)  Followed by  amiodarone (NEXTERONE PREMIX) 360-4.14 MG/200ML-% (1.8 mg/mL) IV infusion (has no administration in time range)  magnesium sulfate IVPB 2 g 50 mL (2 g Intravenous New Bag/Given 02/01/23 1814)  potassium chloride 10 mEq in 100 mL IVPB (10 mEq Intravenous New Bag/Given 02/01/23 1810)  trimethobenzamide (TIGAN) injection 200 mg (has no administration in time range)  heparin injection 5,000 Units (has no administration in time range)  sodium chloride flush (NS) 0.9 % injection 3 mL (has no administration in time range)   ezetimibe (ZETIA) tablet 10 mg (has no administration in time range)  furosemide (LASIX) tablet 40 mg (has no administration in time range)  rosuvastatin (CRESTOR) tablet 20 mg (has no administration in time range)  buPROPion (WELLBUTRIN SR) 12 hr tablet 150 mg (has no administration in time range)  escitalopram (LEXAPRO) tablet 10 mg (has no administration in time range)  hydrOXYzine (ATARAX) tablet 25 mg (has no administration in time range)  levothyroxine (SYNTHROID) tablet 112 mcg (has no administration in time range)  arformoterol (BROVANA) nebulizer solution 15 mcg (has no administration in time range)  aspirin EC tablet 81 mg (has no administration in time range)  metoprolol tartrate (LOPRESSOR) tablet 12.5 mg (has no administration in time range)    Mobility walks     Focused Assessments Cardiac Assessment Handoff:    No results found for: "CKTOTAL", "CKMB", "CKMBINDEX", "TROPONINI" No results found for: "DDIMER" Does the Patient currently have chest pain? No    R Recommendations: See Admitting Provider Note  Report given to:   Additional Notes:  PT HAD runs of Vtach, placed on amio drip, potassuim started, pt alert and alert x 4, uses RW, on RA daughter at bedside.

## 2023-02-01 NOTE — Telephone Encounter (Signed)
Incoming call from patient regarding frequent falls and syncope.  Patient states she was in ED 01/13/23-01/24/23 for syncope. She states it was felt her syncopal episodes were due to OTC sleep medication. Since she was discharged she has developed new symptoms palpitations/racing heart. She states she also feels SOB when she lays on her left side, although she denies any SOB, palpitations or chest pain at this time which she is sitting up.  Patient also states that she has been having frequent falls. She states she fell 4 times yesterday with and without syncope. She states she took her BP and HR and they were "normal" at 145/80 and 70 bpm per her home machine. She states she has been taking all her home medications.  Patient states she is scared she is having a stroke, I advised her to call EMS. She agrees to this plan and hung up to dial 911.

## 2023-02-01 NOTE — Telephone Encounter (Signed)
Pt c/o Shortness Of Breath: STAT if SOB developed within the last 24 hours or pt is noticeably SOB on the phone  1. Are you currently SOB (can you hear that pt is SOB on the phone)? no  2. How long have you been experiencing SOB? Last 5 days   3. Are you SOB when sitting or when up moving around? When laying down on left side.  4. Are you currently experiencing any other symptoms? Passing out/falling on the floor (stated he happen 4 time in last 2 days), lightheadedness, headaches

## 2023-02-01 NOTE — Plan of Care (Signed)
  Problem: Education: Goal: Knowledge of condition and prescribed therapy will improve Outcome: Progressing   Problem: Cardiac: Goal: Will achieve and/or maintain adequate cardiac output Outcome: Progressing   Problem: Physical Regulation: Goal: Complications related to the disease process, condition or treatment will be avoided or minimized Outcome: Progressing   Problem: Education: Goal: Knowledge of General Education information will improve Description: Including pain rating scale, medication(s)/side effects and non-pharmacologic comfort measures Outcome: Progressing   Problem: Health Behavior/Discharge Planning: Goal: Ability to manage health-related needs will improve Outcome: Progressing   Problem: Clinical Measurements: Goal: Ability to maintain clinical measurements within normal limits will improve Outcome: Progressing Goal: Will remain free from infection Outcome: Progressing

## 2023-02-01 NOTE — ED Provider Notes (Signed)
Pt with recurrent vtach episodes, symptomatic, multiple runs of zoll. No loss of pulses. Awake during episodes. Added on some more labs. Will start amio load and gtt. Will d/w cardio. Plan admission. Family updated.  5:10 PM Vtach intermittent, symptomatic; replace lytes, give tigan for n/v  5:52 PM Spoke with cardiology Dr Belenda Cruise, will admit, +/- cath lab.   CRITICAL CARE Performed by: Jeanell Sparrow   Total critical care time: 55 minutes  Critical care time was exclusive of separately billable procedures and treating other patients.  Critical care was necessary to treat or prevent imminent or life-threatening deterioration.  Critical care was time spent personally by me on the following activities: development of treatment plan with patient and/or surrogate as well as nursing, discussions with consultants, evaluation of patient's response to treatment, examination of patient, obtaining history from patient or surrogate, ordering and performing treatments and interventions, ordering and review of laboratory studies, ordering and review of radiographic studies, pulse oximetry and re-evaluation of patient's condition.    Jeanell Sparrow, DO 02/01/23 1753

## 2023-02-01 NOTE — ED Provider Notes (Incomplete)
[facility] Provider Note   CSN: [id number] Arrival date & time: 02/01/23  1305     History {Add pertinent medical, surgical, social history, OB history to HPI:1} No chief complaint on file.   Kimberly Waters is a 77 y.o. female with a past medical history of hypertension, breast cancer, CHF, COPD CAD, stage III CKD presents today for evaluation of shortness of breath and headaches.  Patient reports that she has been having shortness of breath in the last 7 days.  She states that it got worse when she laid on her left side.  She reports she started to have headache again 2 days ago.  She reports she was laying on the couch yesterday and the next thing she knew was that she was on the floor.  She said it happened twice.  This morning when she was in the restroom she said she "blacked out" and the next and she knew was that she was on the floor again.  She denies chest pain however she endorses palpitations at night.  HPI  Past Medical History:  Diagnosis Date  . Allergy   . Breast cancer ([place])   . Cataract   . Colon polyps   . Depression   . Dyspnea    SEASONAL  . Family history of breast cancer   . Family history of colon cancer   . GERD (gastroesophageal reflux disease)   . History of radiation therapy 06/05/17-07/20/17   right chest wall 50.4 Gy in 28 fractions, right axillary nodal region 45 Gy in 25 fractions  . Hyperlipidemia   . Hypertension   . Hypertensive retinopathy   . Hypothyroidism   . Macular degeneration   . Peripheral neuropathy 06/18/2019  . PONV (postoperative nausea and vomiting)    Past Surgical History:  Procedure Laterality Date  . ABDOMINAL HYSTERECTOMY    . KNEE SURGERY Right   . MASTECTOMY W/ SENTINEL NODE BIOPSY Right 03/05/2017   Procedure: BILATERAL TOTAL MASTECTOMIES WITH RIGHT SENTINEL LYMPH NODE BIOPSIES;  Surgeon: Excell Seltzer, MD;  Location: [facility];  Service: General;  Laterality: Right;  .  SHOULDER SURGERY     BILAT     Home Medications Prior to Admission medications   Medication Sig Start Date End Date Taking? Authorizing Provider  albuterol (PROVENTIL,VENTOLIN) 90 MCG/ACT inhaler Inhale 2 puffs into the lungs every 4 (four) hours as needed. Patient taking differently: Inhale 2 puffs into the lungs every 4 (four) hours as needed for wheezing or shortness of breath. 04/04/11   Lyndal Pulley, DO  anastrozole (ARIMIDEX) 1 MG tablet Take 1 tablet (1 mg total) by mouth daily. Stop May 2023 12/07/21   Magrinat, Virgie Dad, MD  arformoterol (BROVANA) 15 MCG/2ML NEBU Take 2 mLs (15 mcg total) by nebulization 2 (two) times daily. 07/27/21   Parrett, Fonnie Mu, NP  betamethasone dipropionate 6.22 % cream 1 application    [provider]  buPROPion (WELLBUTRIN SR) 150 MG 12 hr tablet Take 150 mg by mouth daily.  07/09/19   [provider]  clobetasol ointment (TEMOVATE) 0.05 % as needed. 07/23/20   [provider]  diphenhydrAMINE (BENADRYL) 25 MG tablet Take 25 mg by mouth as needed for sleep.    [provider]  escitalopram (LEXAPRO) 10 MG tablet Take 1 tablet (10 mg total) by mouth daily. 04/04/18   Geradine Girt, DO  ezetimibe (ZETIA) 10 MG tablet Take 1 tablet (10 mg total) by mouth  daily. 03/08/22   Jerline Pain, MD  furosemide (LASIX) 40 MG tablet Take 1 tablet (40 mg total) by mouth daily. 04/04/18   Geradine Girt, DO  hydrOXYzine (ATARAX/VISTARIL) 25 MG tablet Take 25 mg by mouth at bedtime.    [provider]  imipramine (TOFRANIL) 50 MG tablet Take 50 mg by mouth at bedtime.    [provider]  ipratropium (ATROVENT) 0.06 % nasal spray Place 2 sprays into the nose 4 (four) times daily. 09/17/18   Brand Males, MD  ketoconazole (NIZORAL) 2 % cream Apply 1 application topically daily. 07/21/21   Magrinat, Virgie Dad, MD  levocetirizine (XYZAL) 5 MG tablet Take 5 mg by mouth as needed for allergies.    [provider]   levothyroxine (SYNTHROID, LEVOTHROID) 112 MCG tablet TAKE ONE TABLET BY MOUTH ONCE DAILY 03/27/14   Leone Haven, MD  mometasone (ELOCON) 0.1 % ointment Apply topically as needed. 08/04/20   [provider]  nitroGLYCERIN (NITROSTAT) 0.4 MG SL tablet as needed. 10/22/20   [provider]  omeprazole (PRILOSEC) 20 MG capsule Take 20 mg by mouth daily.    [provider]  Potassium Chloride ER 20 MEQ TBCR 1 tablet with food    [provider]  revefenacin (YUPELRI) 175 MCG/3ML nebulizer solution Take 3 mLs (175 mcg total) by nebulization daily. 07/27/21   Parrett, Fonnie Mu, NP  rosuvastatin (CRESTOR) 20 MG tablet Take 1 tablet (20 mg total) by mouth daily. 03/08/22   Jerline Pain, MD  tacrolimus (PROTOPIC) 0.1 % ointment Apply topically as needed. 08/04/20   [provider]      Allergies    Latex, Adhesive [tape], Codeine, and Other    Review of Systems   Review of Systems  Physical Exam Updated Vital Signs BP (!) 133/92 (BP Location: Left Arm)   Pulse 64   Temp 97.8 F (36.6 C) (Oral)   Resp 18   Ht '5\' 2"'$  (1.575 m)   Wt 85.3 kg   SpO2 100%   BMI 34.40 kg/m  Physical Exam  ED Results / Procedures / Treatments   Labs (all labs ordered are listed, but only abnormal results are displayed) Labs Reviewed  BASIC METABOLIC PANEL - Abnormal; Notable for the following components:      Result Value   Potassium 2.8 (*)    Chloride 97 (*)    Glucose, Bld 117 (*)    Creatinine, Ser 1.61 (*)    GFR, Estimated 33 (*)    All other components within normal limits  CBC - Abnormal; Notable for the following components:   WBC 11.3 (*)    All other components within normal limits  TROPONIN I (HIGH SENSITIVITY) - Abnormal; Notable for the following components:   Troponin I (High Sensitivity) 21 (*)    All other components within normal limits  RESP PANEL BY RT-PCR (RSV, FLU A&B, COVID)  RVPGX2  BRAIN NATRIURETIC PEPTIDE  MAGNESIUM  TSH  T4,  FREE  PHOSPHORUS  CBC  CREATININE, SERUM  BASIC METABOLIC PANEL  CBC  BRAIN NATRIURETIC PEPTIDE  TROPONIN I (HIGH SENSITIVITY)    EKG EKG Interpretation  Date/Time:  Thursday February 01 2023 14:05:39 EST Ventricular Rate:  63 PR Interval:  127 QRS Duration: 124 QT Interval:  469 QTC Calculation: 481 R Axis:   3 Text Interpretation: Sinus rhythm Nonspecific intraventricular conduction delay Borderline repolarization abnormality Confirmed by Cindee Lame [phone]) on 02/01/2023 4:34:08 PM  Radiology DG Chest 2  View  Result Date: 02/01/2023 CLINICAL DATA:  Shortness of breath.  Fall last night. EXAM: CHEST - 2 VIEW COMPARISON:  01/23/2023 FINDINGS: Lungs are adequately inflated without focal airspace consolidation, effusion or pneumothorax. Cardiomediastinal silhouette is normal. No acute fracture. IMPRESSION: No active cardiopulmonary disease. Electronically Signed   By: Marin Olp M.D.   On: 02/01/2023 14:41    Procedures Procedures  {Document cardiac monitor, telemetry assessment procedure when appropriate:1}  Medications Ordered in ED Medications  ketorolac (TORADOL) 15 MG/ML injection 15 mg (has no administration in time range)  amiodarone (NEXTERONE) 1.8 mg/mL load via infusion 150 mg (150 mg Intravenous Bolus from Bag 02/01/23 1643)    Followed by  amiodarone (NEXTERONE PREMIX) 360-4.14 MG/200ML-% (1.8 mg/mL) IV infusion (60 mg/hr Intravenous New Bag/Given 02/01/23 1643)    Followed by  amiodarone (NEXTERONE PREMIX) 360-4.14 MG/200ML-% (1.8 mg/mL) IV infusion (has no administration in time range)  magnesium sulfate IVPB 2 g 50 mL (has no administration in time range)  potassium chloride 10 mEq in 100 mL IVPB (has no administration in time range)  trimethobenzamide (TIGAN) injection 200 mg (has no administration in time range)  heparin injection 5,000 Units (has no administration in time range)  sodium chloride flush (NS) 0.9 % injection 3 mL (has no administration in time  range)    ED Course/ Medical Decision Making/ A&P Clinical Course as of 02/01/23 1755  Thu Feb 01, 2023  1638 Pt with recurrent vtach episodes, symptomatic, multiple runs of zoll. Added on some more labs. Will start amio load and gtt. Will d/w cardio.  [SG]    Clinical Course User Index [SG] Jeanell Sparrow, DO   {   Click here for ABCD2, HEART and other calculatorsREFRESH Note before signing :1}                          Medical Decision Making Amount and/or Complexity of Data Reviewed Labs: ordered. Radiology: ordered.   This patient presents to the ED for shortness of breath, chest pain, this involves an extensive number of treatment options, and is a complaint that carries with a high risk of complications and morbidity.  The differential diagnosis includes CHF/ACS, COPD asthma, pneumonia, anaphylaxis, PE, pneumothorax, anxiety, COVID-19.  This is not an exhaustive list.  Lab tests: I ordered and personally interpreted labs.  The pertinent results include: WBC 11.3. Hbg unremarkable. Platelets unremarkable. No electrolyte abnormalities noted. BUN, creatinine unremarkable. UA significant for no acute abnormality. Troponin 21.  Imaging studies: I ordered imaging studies. I personally reviewed, interpreted imaging and agree with the radiologist's interpretations. The results include: ***   Problem list/ ED course/ Critical interventions/ Medical management: HPI: See above Vital signs ***within normal range and stable throughout visit. Laboratory/imaging studies significant for: See above. On physical examination, patient is afebrile and appears in no acute distress. ***.  Based on patient's clinical presentations and laboratory/imaging studies I suspect ***. Reevaluation of the patient after these medications showed that the patient {resolved/improved/worsened:23923::"improved"}. Advised patient to ***. I have reviewed the patient home medicines and have made adjustments as  needed.  Cardiac monitoring/EKG: The patient was maintained on a cardiac monitor.  I personally reviewed and interpreted the cardiac monitor which showed an underlying rhythm of: sinus rhythm.  Additional history obtained: External records from outside source obtained and reviewed including: Chart review including previous notes, labs, imaging.  Consultations obtained: I requested consultation with Dr. Harriet Masson cardiology, and discussed lab and imaging  findings as well as pertinent plan.  She agreed to admit the patient to her service.  Disposition Patient is admitted to cardiology.  This chart was dictated using voice recognition software.  Despite best efforts to proofread,  errors can occur which can change the documentation meaning.    {Document critical care time when appropriate:1} {Document review of labs and clinical decision tools ie heart score, Chads2Vasc2 etc:1}  {Document your independent review of radiology images, and any outside records:1} {Document your discussion with family members, caretakers, and with consultants:1} {Document social determinants of health affecting pt's care:1} {Document your decision making why or why not admission, treatments were needed:1} Final Clinical Impression(s) / ED Diagnoses Final diagnoses:  V-tach [facility])    Rx / DC Orders ED Discharge Orders     None

## 2023-02-01 NOTE — ED Notes (Signed)
Pt in bed, pt appears to have had a short run of vtach on the monitor, pt states that she doesn't feel well when the episodes happen, pt appears to have had several more episodes of longer v tach, pt has a pulse during episodes, but does become pale and states that she doesn't feel well. Md at bedside.

## 2023-02-01 NOTE — Consult Note (Addendum)
Cardiology Consultation   Patient ID: Kimberly Waters MRN: [id number]; DOB: 1946-09-19  Admit date: 02/01/2023 Date of Consult: 02/01/2023  PCP:  [facility] Cardiologist:  Candee Furbish, MD   {  Patient Profile:   Kimberly Waters is a 77 y.o. female with a hx of CAD, LBBB, COPD, breast cancer s/p mastectomy, GERD, depression,chronic diastolic heart failure,  who is being seen 02/01/2023 for the evaluation of syncope and VT at the request of Dr Pearline Cables.  History of Present Illness:   Kimberly Waters with above PMH follows Dr Marlou Porch outpatient. She had complaints of fatigue and DOE in the past, CT coronary was done 05/14/20 showed Coronary calcium score of 131, 72 th percentile, scattered LAD calcified plaque up to 74% at bifurcation of mid diagonal (sending for FFR) and non flow limiting proximal circumflex stenosis (0-24%). CT FFR analysis didn't show any significant stenosis. Cardiac MRI of 07/01/20 showed very prominent epicardial adipose tissue as well as lipomatous atrial septal hypertrophy extending into the right atrial free wall. LVEF 67%, RVEF 69%, No myocardial LGE. Last Echo 01/24/23 showed LVEF 50-55%, grade I DD, normal RV, trivial MR. He was last seen by Dr Marlou Porch 03/08/22, main complaint was fatigue, felt MRI finding was benign, was given crestor and zetia but she does not believe she can take them. Her fatigue was felt related to benadryl use.   She was hospitalized here for syncope 01/23/23, EEG was negative, CTH and MRI of brain no acute findings. Echo as above. Heart monitor was recommended but this was not done.   She returned to ER today for syncope. She fell last night and hit her head. Today again she reported an episode of passing out abruptly which prompted her visit to the ED.  She was seen and examined at her bedside. Her daughter was by her bedside.     Past Medical History:  Diagnosis Date   Allergy    Breast cancer  ([place])    Cataract    Colon polyps    Depression    Dyspnea    SEASONAL   Family history of breast cancer    Family history of colon cancer    GERD (gastroesophageal reflux disease)    History of radiation therapy 06/05/17-07/20/17   right chest wall 50.4 Gy in 28 fractions, right axillary nodal region 45 Gy in 25 fractions   Hyperlipidemia    Hypertension    Hypertensive retinopathy    Hypothyroidism    Macular degeneration    Peripheral neuropathy 06/18/2019   PONV (postoperative nausea and vomiting)     Past Surgical History:  Procedure Laterality Date   ABDOMINAL HYSTERECTOMY     KNEE SURGERY Right    MASTECTOMY W/ SENTINEL NODE BIOPSY Right 03/05/2017   Procedure: BILATERAL TOTAL MASTECTOMIES WITH RIGHT SENTINEL LYMPH NODE BIOPSIES;  Surgeon: Excell Seltzer, MD;  Location: [facility];  Service: General;  Laterality: Right;   SHOULDER SURGERY     BILAT     Home Medications:  Prior to Admission medications   Medication Sig Start Date End Date Taking? Authorizing Provider  albuterol (PROVENTIL,VENTOLIN) 90 MCG/ACT inhaler Inhale 2 puffs into the lungs every 4 (four) hours as needed. Patient taking differently: Inhale 2 puffs into the lungs every 4 (four) hours as needed for wheezing or shortness of breath. 04/04/11   Lyndal Pulley, DO  anastrozole (ARIMIDEX) 1 MG tablet Take 1 tablet (1 mg total) by  mouth daily. Stop May 2023 12/07/21   Magrinat, Virgie Dad, MD  arformoterol (BROVANA) 15 MCG/2ML NEBU Take 2 mLs (15 mcg total) by nebulization 2 (two) times daily. 07/27/21   Parrett, Fonnie Mu, NP  betamethasone dipropionate [id number] % cream 1 application    [provider]  buPROPion (WELLBUTRIN SR) 150 MG 12 hr tablet Take 150 mg by mouth daily.  07/09/19   [provider]  clobetasol ointment (TEMOVATE) 0.05 % as needed. 07/23/20   [provider]  diphenhydrAMINE (BENADRYL) 25 MG tablet Take 25 mg by mouth as needed for sleep.    [provider]   escitalopram (LEXAPRO) 10 MG tablet Take 1 tablet (10 mg total) by mouth daily. 04/04/18   Geradine Girt, DO  ezetimibe (ZETIA) 10 MG tablet Take 1 tablet (10 mg total) by mouth daily. 03/08/22   Jerline Pain, MD  furosemide (LASIX) 40 MG tablet Take 1 tablet (40 mg total) by mouth daily. 04/04/18   Geradine Girt, DO  hydrOXYzine (ATARAX/VISTARIL) 25 MG tablet Take 25 mg by mouth at bedtime.    [provider]  imipramine (TOFRANIL) 50 MG tablet Take 50 mg by mouth at bedtime.    [provider]  ipratropium (ATROVENT) 0.06 % nasal spray Place 2 sprays into the nose 4 (four) times daily. 09/17/18   Brand Males, MD  ketoconazole (NIZORAL) 2 % cream Apply 1 application topically daily. 07/21/21   Magrinat, Virgie Dad, MD  levocetirizine (XYZAL) 5 MG tablet Take 5 mg by mouth as needed for allergies.    [provider]  levothyroxine (SYNTHROID, LEVOTHROID) 112 MCG tablet TAKE ONE TABLET BY MOUTH ONCE DAILY 03/27/14   Leone Haven, MD  mometasone (ELOCON) 0.1 % ointment Apply topically as needed. 08/04/20   [provider]  nitroGLYCERIN (NITROSTAT) 0.4 MG SL tablet as needed. 10/22/20   [provider]  omeprazole (PRILOSEC) 20 MG capsule Take 20 mg by mouth daily.    [provider]  Potassium Chloride ER 20 MEQ TBCR 1 tablet with food    [provider]  revefenacin (YUPELRI) 175 MCG/3ML nebulizer solution Take 3 mLs (175 mcg total) by nebulization daily. 07/27/21   Parrett, Fonnie Mu, NP  rosuvastatin (CRESTOR) 20 MG tablet Take 1 tablet (20 mg total) by mouth daily. 03/08/22   Jerline Pain, MD  tacrolimus (PROTOPIC) 0.1 % ointment Apply topically as needed. 08/04/20   [provider]    Inpatient Medications: Scheduled Meds:  amiodarone  150 mg Intravenous Once   ketorolac  15 mg Intravenous Once   Continuous Infusions:  amiodarone 60 mg/hr (02/01/23 1643)   Followed by   amiodarone     PRN  Meds:   Allergies:    Allergies  Allergen Reactions   Latex Other (See Comments)    Other reaction(s): Other (See Comments) Tears skin.   Adhesive [Tape] Other (See Comments)    (skin tears)  Tolerates PAPER TAPE   Codeine Nausea Only   Other Nausea Only    Anesthesia--nausea     Social History:   Social History   Socioeconomic History   Marital status: Married    Spouse name: Not on file   Number of children: 2   Years of education: Not on file   Highest education level: Not on file  Occupational History   Occupation: reitred [employer]  Tobacco Use   Smoking status: Former    Packs/day: 1.00  Years: 45.00    Total pack years: 45.00    Types: Cigarettes    Quit date: 02/22/2017    Years since quitting: 5.9   Smokeless tobacco: Never  Vaping Use   Vaping Use: Never used  Substance and Sexual Activity   Alcohol use: No    Alcohol/week: 0.0 standard drinks of alcohol   Drug use: No   Sexual activity: Not Currently  Other Topics Concern   Not on file  Social History Narrative   Not on file   Social Determinants of Health   Financial Resource Strain: Not on file  Food Insecurity: Not on file  Transportation Needs: Not on file  Physical Activity: Not on file  Stress: Not on file  Social Connections: Not on file  Intimate Partner Violence: Not on file    Family History:    Family History  Problem Relation Age of Onset   Heart disease Mother 104       stents   Colon cancer Maternal Grandfather 74   Colon cancer Maternal Aunt 65   Colon cancer Maternal Uncle        dx in his 51s   Breast cancer Paternal Aunt 1   Breast cancer Cousin 77       paternal first cousin   Lung cancer Paternal Grandfather    Colon cancer Maternal Uncle    Head & neck cancer Maternal Uncle        oral cancer     ROS:  Please see the history of present illness.  Syncope All other ROS reviewed and negative.     Physical Exam/Data:   Vitals:   02/01/23  1325 02/01/23 1327  BP: (!) 161/83   Pulse: 64   Resp: 16   Temp: 98.1 F (36.7 C)   SpO2: 97%   Weight:  85.3 kg  Height:  5' 2"$  (1.575 m)   No intake or output data in the 24 hours ending 02/01/23 1652    02/01/2023    1:27 PM 03/08/2022    3:48 PM 12/07/2021   10:53 AM  Last 3 Weights  Weight (lbs) 188 lb 0.8 oz 188 lb 184 lb 14.4 oz  Weight (kg) 85.3 kg 85.276 kg 83.87 kg     Body mass index is 34.4 kg/m.  General:  Well nourished, well developed, in no acute distress HEENT: normal Neck: no JVD Vascular: No carotid bruits; Distal pulses 2+ bilaterally Cardiac:  normal S1, S2; RRR; no murmur  Lungs:  clear to auscultation bilaterally, no wheezing, rhonchi or rales  Abd: soft, nontender, no hepatomegaly  Ext: no edema Musculoskeletal:  No deformities, BUE and BLE strength normal and equal Skin: warm and dry  Neuro:  CNs 2-12 intact, no focal abnormalities noted Psych:  Normal affect   EKG:  The EKG was personally reviewed and demonstrates:   Telemetry:  Telemetry was personally reviewed and demonstrates:  Sinus rhythm    Relevant CV Studies:  TTE 01/24/2023 IMPRESSIONS   1. Left ventricular ejection fraction, by estimation, is 50 to 55%. Left ventricular ejection fraction by PLAX is 59 %. The left ventricle has low normal function. The left ventricle has no regional wall motion abnormalities. There is mild concentric left ventricular hypertrophy. Left ventricular diastolic parameters are consistent with Grade I diastolic dysfunction (impaired relaxation).   2. Right ventricular systolic function is normal. The right ventricular size is normal.   3. The mitral valve is grossly normal. Trivial mitral valve regurgitation. No evidence  of mitral stenosis.   4. The aortic valve is tricuspid. Aortic valve regurgitation is not visualized. No aortic stenosis is present.   5. The inferior vena cava is normal in size with greater than 50% respiratory variability, suggesting right  atrial pressure of 3 mmHg.   FINDINGS   Left Ventricle: Left ventricular ejection fraction, by estimation, is 50 to 55%. Left ventricular ejection fraction by PLAX is 59 %. The left ventricle has low normal function. The left ventricle has no regional wall motion abnormalities. The left ventricular internal cavity size was normal in size. There is mild concentric left ventricular hypertrophy. Left ventricular diastolic parameters are consistent with Grade I diastolic dysfunction (impaired relaxation). Indeterminate filling pressures.   Right Ventricle: The right ventricular size is normal. No increase in right ventricular wall thickness. Right ventricular systolic function is normal.   Left Atrium: Left atrial size was normal in size.   Right Atrium: Right atrial size was normal in size.   Pericardium: Trivial pericardial effusion is present. The pericardial effusion is circumferential.   Mitral Valve: The mitral valve is grossly normal. Trivial mitral valve regurgitation. No evidence of mitral valve stenosis.   Tricuspid Valve: The tricuspid valve is grossly normal. Tricuspid valve regurgitation is not demonstrated. No evidence of tricuspid stenosis.   Aortic Valve: The aortic valve is tricuspid. Aortic valve regurgitation is not visualized. No aortic stenosis is present.   Pulmonic Valve: The pulmonic valve was grossly normal. Pulmonic valve regurgitation is not visualized. No evidence of pulmonic stenosis.   Aorta: The aortic root and ascending aorta are structurally normal, with  no evidence of dilitation.   Venous: The inferior vena cava is normal in size with greater than 50%  respiratory variability, suggesting right atrial pressure of 3 mmHg.   IAS/Shunts: No atrial level shunt detected by color flow Doppler.   Cardiac MR 07/01/2020 Narrative & Impression  CLINICAL DATA:  Prominent suspected lipomatous atrial septal hypertrophy.   EXAM: CARDIAC MRI   TECHNIQUE: The patient  was scanned on a 1.5 Tesla GE magnet. A dedicated cardiac coil was used. Functional imaging was done using Fiesta sequences. 2,3, and 4 chamber views were done to assess for RWMA's. Modified Simpson's rule using a short axis stack was used to calculate an ejection fraction on a dedicated work Conservation officer, nature. The patient received 10 cc of Gadavist. After 10 minutes inversion recovery sequences were used to assess for infiltration and scar tissue.   CONTRAST:  10 cc Gadavist   FINDINGS: Limited images of the lung fields show no gross abnormalities.   Very prominent epicardial adipose tissue noted. Normal left ventricular size and wall thickness. Normal wall motion with EF 67%. Normal right ventricular size and systolic function, EF [id number]. Normal right and left atrial sizes. There is prominent lipomatous atrial septal hypertrophy that extends into the right atrial free wall. Trileaflet aortic valve with no stenosis or regurgitation. Trivial mitral regurgitation.   On delayed enhancement imaging, there is no myocardial late gadolinium enhancement (LGE).   Measurements:   LVEDV 122 mL   LVSV 81 mL LVEF 67%   RVEDV 91 mL RVSV 63 mL RVEF 69%   IMPRESSION: 1. Very prominent epicardial adipose tissue as well as lipomatous atrial septal hypertrophy extending into the right atrial free wall.   2.  Normal LV size and systolic function, EF [id number].   3.  Normal RV size and systolic function, EF [id number].   4. No myocardial LGE, so no definitive  evidence for prior MI, infiltrative disease, or myocarditis.   Dalton Mclean     CCTA  Aorta: Normal size. Mild root and descending calcifications/ atherosclerosis (dense arch atherosclerosis seen on chest CT). No dissection.   Aortic Valve:  Trileaflet.  No calcifications.   Coronary Arteries:  Normal coronary origin.  Right dominance.   RCA is a large dominant artery that gives rise to PDA and PLA. There is no plaque.    Left main is a large artery that gives rise to LAD and LCX arteries.   LAD is a large vessel that has proximal (25-49%) and scattered mid calcified lesions (up to 50-74% at bifurcation of large mid diagonal branch). Sending for FFR analysis.   LCX is a non-dominant artery that gives rise to one large OM1 branch. There is calcified proximal plaque 0-24% non flow limiting.   Other findings:   Normal pulmonary vein drainage into the left atrium with accessory right middle pulmonary vein.   Normal left atrial appendage without a thrombus.   Normal size of the pulmonary artery.   There is moderate pericardial fat diffusely.   There is lipomatous hypertrophy of interatrial septum with significant fatty deposition.   There is hypodense mixing artifact in right atrium and right ventricle.   Please see radiology report for non cardiac findings.   IMPRESSION: 1. Coronary calcium score of 131. This was 82 percentile for age and sex matched control.   2. Normal coronary origin with right dominance.   3. Normal RCA, scattered LAD calcified plaque up to 74% at bifurcation of mid diagonal (sending for FFR) and non flow limiting proximal circumflex stenosis (0-24%).   4.  Aortic atherosclerosis.   5. Lipomatous hypertrophy of interatrial septum. Given prominent nature of fatty deposition, recommend cardiac MRI for further clarification.   Candee Furbish, MD [facility]     Electronically Signed   By: Candee Furbish MD   On: 05/14/2020 16:44     Laboratory Data:  High Sensitivity Troponin:   Recent Labs  Lab 01/23/23 1808 01/23/23 1952  TROPONINIHS 61 16     ChemistryNo results for input(s): "NA", "K", "CL", "CO2", "GLUCOSE", "BUN", "CREATININE", "CALCIUM", "MG", "GFRNONAA", "GFRAA", "ANIONGAP" in the last 168 hours.  No results for input(s): "PROT", "ALBUMIN", "AST", "ALT", "ALKPHOS", "BILITOT" in the last 168 hours. Lipids No results for input(s): "CHOL", "TRIG", "HDL",  "LABVLDL", "LDLCALC", "CHOLHDL" in the last 168 hours.  Hematology Recent Labs  Lab 02/01/23 1553  WBC 11.3*  RBC 4.56  HGB 13.5  HCT 40.4  MCV 88.6  MCH 29.6  MCHC 33.4  RDW 14.4  PLT 336   Thyroid No results for input(s): "TSH", "FREET4" in the last 168 hours.  BNPNo results for input(s): "BNP", "PROBNP" in the last 168 hours.  DDimer No results for input(s): "DDIMER" in the last 168 hours.   Radiology/Studies:  DG Chest 2 View  Result Date: 02/01/2023 CLINICAL DATA:  Shortness of breath.  Fall last night. EXAM: CHEST - 2 VIEW COMPARISON:  01/23/2023 FINDINGS: Lungs are adequately inflated without focal airspace consolidation, effusion or pneumothorax. Cardiomediastinal silhouette is normal. No acute fracture. IMPRESSION: No active cardiopulmonary disease. Electronically Signed   By: Marin Olp M.D.   On: 02/01/2023 14:41     Assessment and Plan:   Ventricular tachycardia  Syncope  CAD  Hypertension  Hyperlipidemia   Patient seen examined at bedside.  Recent episode of ventricular tachycardia and syncope.  She was quite symptomatic when she presented to the  ED but that has resolved.  She is now on amiodarone we will continue amiodarone drip. She will require an ischemic evaluation, will put the patient in for a left heart catheterization tomorrow.  Will start aspirin 81 mg daily, does not appear that she is on aspirin at home we will continue her rosuvastatin 20 mg daily, she also takes Lasix clinically she does not appear to be fluid overloaded we will restart that.  Will start low-dose metoprolol titrate 12.5 mg twice daily.  I discussed with the patient and her daughter about the plan for ischemic evaluation and they are both agreeable for this.  Shared Decision Making/Informed Consent The risks [stroke (1 in 1000), death (1 in 1000), kidney failure [usually temporary] (1 in 500), bleeding (1 in 200), allergic reaction [possibly serious] (1 in 200)], benefits  (diagnostic support and management of coronary artery disease) and alternatives of a cardiac catheterization were discussed in detail with Kimberly Waters and she is willing to proceed.    Admit inpatient for greater than 2 midnight DVT prophylaxis -low molecular weight heparin CODE STATUS: Full  Risk Assessment/Risk Scores:          For questions or updates, please contact [place] Please consult [email or web address] for contact info under    Signed, Margie Billet, NP  02/01/2023 4:52 PM

## 2023-02-01 NOTE — H&P (View-Only) (Signed)
Cardiology Consultation   Patient ID: [name] MRN: [id number]; DOB: [birth year]  Admit date: 02/01/2023 Date of Consult: 02/01/2023  PCP:  [facility] Cardiologist:  Candee Furbish, MD   {  Patient Profile:   Kimberly Waters is a 77 y.o. female with a hx of CAD, LBBB, COPD, breast cancer s/p mastectomy, GERD, depression,chronic diastolic heart failure,  who is being seen 02/01/2023 for the evaluation of syncope and VT at the request of Kimberly Waters.  History of Present Illness:   Kimberly Waters with above PMH follows Kimberly [facility]ou [facility]ch outpatient. She had complaints of fatigue and DOE in the past, CT coronary was done 05/14/20 showed Coronary calcium score of 131, 72 th percentile, scattered LAD calcified plaque up to 74% at bifurcation of mid diagonal (sending for FFR) and non flow limiting proximal circumflex stenosis (0-24%). CT FFR analysis didn't show any significant stenosis. Cardiac MRI of 07/01/20 showed very prominent epicardial adipose tissue as well as lipomatous atrial septal hypertrophy extending into the right atrial free wall. LVEF 67%, RVEF 69%, No myocardial LGE. Last Echo 01/24/23 showed LVEF 50-55%, grade I DD, normal RV, trivial MR. He was last seen by Kimberly Marlou Porch 03/08/22, main complaint was fatigue, felt MRI finding was benign, was given crestor and zetia but she does not believe she can take them. Her fatigue was felt related to benadryl use.   She was hospitalized here for syncope 01/23/23, EEG was negative, CTH and MRI of brain no acute findings. Echo as above. Heart monitor was recommended but this was not done.   She returned to ER today for syncope. She fell last night and hit her head. Today again she reported an episode of passing out abruptly which prompted her visit to the ED.  She was seen and examined at her bedside. Her daughter was by her bedside.     Past Medical History:  Diagnosis Date   Allergy    Breast cancer  ([place])    Cataract    Colon polyps    Depression    Dyspnea    SEASONAL   Family history of breast cancer    Family history of colon cancer    GERD (gastroesophageal reflux disease)    History of radiation therapy 06/05/17-07/20/17   right chest wall 50.4 Gy in 28 fractions, right axillary nodal region 45 Gy in 25 fractions   Hyperlipidemia    Hypertension    Hypertensive retinopathy    Hypothyroidism    Macular degeneration    Peripheral neuropathy 06/18/2019   PONV (postoperative nausea and vomiting)     Past Surgical History:  Procedure Laterality Date   ABDOMINAL HYSTERECTOMY     KNEE SURGERY Right    MASTECTOMY W/ SENTINEL NODE BIOPSY Right 03/05/2017   Procedure: BILATERAL TOTAL MASTECTOMIES WITH RIGHT SENTINEL LYMPH NODE BIOPSIES;  Surgeon: Excell Seltzer, MD;  Location: [facility];  Service: General;  Laterality: Right;   SHOULDER SURGERY     BILAT     Home Medications:  Prior to Admission medications   Medication Sig Start Date End Date Taking? Authorizing Provider  albuterol (PROVENTIL,VENTOLIN) 90 MCG/ACT inhaler Inhale 2 puffs into the lungs every 4 (four) hours as needed. Patient taking differently: Inhale 2 puffs into the lungs every 4 (four) hours as needed for wheezing or shortness of breath. 04/04/11   Lyndal Pulley, DO  anastrozole (ARIMIDEX) 1 MG tablet Take 1 tablet (1 mg total) by  mouth daily. Stop May 2023 12/07/21   Magrinat, Virgie Dad, MD  arformoterol (BROVANA) 15 MCG/2ML NEBU Take 2 mLs (15 mcg total) by nebulization 2 (two) times daily. 07/27/21   Parrett, Fonnie Mu, NP  betamethasone dipropionate [id number] % cream 1 application    [provider]  buPROPion (WELLBUTRIN SR) 150 MG 12 hr tablet Take 150 mg by mouth daily.  07/09/19   [provider]  clobetasol ointment (TEMOVATE) 0.05 % as needed. 07/23/20   [provider]  diphenhydrAMINE (BENADRYL) 25 MG tablet Take 25 mg by mouth as needed for sleep.    [provider]   escitalopram (LEXAPRO) 10 MG tablet Take 1 tablet (10 mg total) by mouth daily. 04/04/18   Geradine Girt, DO  ezetimibe (ZETIA) 10 MG tablet Take 1 tablet (10 mg total) by mouth daily. 03/08/22   Jerline Pain, MD  furosemide (LASIX) 40 MG tablet Take 1 tablet (40 mg total) by mouth daily. 04/04/18   Geradine Girt, DO  hydrOXYzine (ATARAX/VISTARIL) 25 MG tablet Take 25 mg by mouth at bedtime.    [provider]  imipramine (TOFRANIL) 50 MG tablet Take 50 mg by mouth at bedtime.    [provider]  ipratropium (ATROVENT) 0.06 % nasal spray Place 2 sprays into the nose 4 (four) times daily. 09/17/18   Brand Males, MD  ketoconazole (NIZORAL) 2 % cream Apply 1 application topically daily. 07/21/21   Magrinat, Virgie Dad, MD  levocetirizine (XYZAL) 5 MG tablet Take 5 mg by mouth as needed for allergies.    [provider]  levothyroxine (SYNTHROID, LEVOTHROID) 112 MCG tablet TAKE ONE TABLET BY MOUTH ONCE DAILY 03/27/14   Leone Haven, MD  mometasone (ELOCON) 0.1 % ointment Apply topically as needed. 08/04/20   [provider]  nitroGLYCERIN (NITROSTAT) 0.4 MG SL tablet as needed. 10/22/20   [provider]  omeprazole (PRILOSEC) 20 MG capsule Take 20 mg by mouth daily.    [provider]  Potassium Chloride ER 20 MEQ TBCR 1 tablet with food    [provider]  revefenacin (YUPELRI) 175 MCG/3ML nebulizer solution Take 3 mLs (175 mcg total) by nebulization daily. 07/27/21   Parrett, Fonnie Mu, NP  rosuvastatin (CRESTOR) 20 MG tablet Take 1 tablet (20 mg total) by mouth daily. 03/08/22   Jerline Pain, MD  tacrolimus (PROTOPIC) 0.1 % ointment Apply topically as needed. 08/04/20   [provider]    Inpatient Medications: Scheduled Meds:  amiodarone  150 mg Intravenous Once   ketorolac  15 mg Intravenous Once   Continuous Infusions:  amiodarone 60 mg/hr (02/01/23 1643)   Followed by   amiodarone     PRN  Meds:   Allergies:    Allergies  Allergen Reactions   Latex Other (See Comments)    Other reaction(s): Other (See Comments) Tears skin.   Adhesive [Tape] Other (See Comments)    (skin tears)  Tolerates PAPER TAPE   Codeine Nausea Only   Other Nausea Only    Anesthesia--nausea     Social History:   Social History   Socioeconomic History   Marital status: Married    Spouse name: Not on file   Number of children: 2   Years of education: Not on file   Highest education level: Not on file  Occupational History   Occupation: reitred [employer]  Tobacco Use   Smoking status: Former    Packs/day: 1.00  Years: 45.00    Total pack years: 45.00    Types: Cigarettes    Quit date: 02/22/2017    Years since quitting: 5.9   Smokeless tobacco: Never  Vaping Use   Vaping Use: Never used  Substance and Sexual Activity   Alcohol use: No    Alcohol/week: 0.0 standard drinks of alcohol   Drug use: No   Sexual activity: Not Currently  Other Topics Concern   Not on file  Social History Narrative   Not on file   Social Determinants of Health   Financial Resource Strain: Not on file  Food Insecurity: Not on file  Transportation Needs: Not on file  Physical Activity: Not on file  Stress: Not on file  Social Connections: Not on file  Intimate Partner Violence: Not on file    Family History:    Family History  Problem Relation Age of Onset   Heart disease Mother 71       stents   Colon cancer Maternal Grandfather 61   Colon cancer Maternal Aunt 65   Colon cancer Maternal Uncle        dx in his 23s   Breast cancer Paternal Aunt 24   Breast cancer Cousin 72       paternal first cousin   Lung cancer Paternal Grandfather    Colon cancer Maternal Uncle    Head & neck cancer Maternal Uncle        oral cancer     ROS:  Please see the history of present illness.  Syncope All other ROS reviewed and negative.     Physical Exam/Data:   Vitals:   02/01/23  1325 02/01/23 1327  BP: (!) 161/83   Pulse: 64   Resp: 16   Temp: 98.1 F (36.7 C)   SpO2: 97%   Weight:  85.3 kg  Height:  5' 2"$  (1.575 m)   No intake or output data in the 24 hours ending 02/01/23 1652    02/01/2023    1:27 PM 03/08/2022    3:48 PM 12/07/2021   10:53 AM  Last 3 Weights  Weight (lbs) 188 lb 0.8 oz 188 lb 184 lb 14.4 oz  Weight (kg) 85.3 kg 85.276 kg 83.87 kg     Body mass index is 34.4 kg/m.  General:  Well nourished, well developed, in no acute distress HEENT: normal Neck: no JVD Vascular: No carotid bruits; Distal pulses 2+ bilaterally Cardiac:  normal S1, S2; RRR; no murmur  Lungs:  clear to auscultation bilaterally, no wheezing, rhonchi or rales  Abd: soft, nontender, no hepatomegaly  Ext: no edema Musculoskeletal:  No deformities, BUE and BLE strength normal and equal Skin: warm and dry  Neuro:  CNs 2-12 intact, no focal abnormalities noted Psych:  Normal affect   EKG:  The EKG was personally reviewed and demonstrates:   Telemetry:  Telemetry was personally reviewed and demonstrates:  Sinus rhythm    Relevant CV Studies:  TTE 01/24/2023 IMPRESSIONS   1. Left ventricular ejection fraction, by estimation, is 50 to 55%. Left ventricular ejection fraction by PLAX is 59 %. The left ventricle has low normal function. The left ventricle has no regional wall motion abnormalities. There is mild concentric left ventricular hypertrophy. Left ventricular diastolic parameters are consistent with Grade I diastolic dysfunction (impaired relaxation).   2. Right ventricular systolic function is normal. The right ventricular size is normal.   3. The mitral valve is grossly normal. Trivial mitral valve regurgitation. No evidence  of mitral stenosis.   4. The aortic valve is tricuspid. Aortic valve regurgitation is not visualized. No aortic stenosis is present.   5. The inferior vena cava is normal in size with greater than 50% respiratory variability, suggesting right  atrial pressure of 3 mmHg.   FINDINGS   Left Ventricle: Left ventricular ejection fraction, by estimation, is 50 to 55%. Left ventricular ejection fraction by PLAX is 59 %. The left ventricle has low normal function. The left ventricle has no regional wall motion abnormalities. The left ventricular internal cavity size was normal in size. There is mild concentric left ventricular hypertrophy. Left ventricular diastolic parameters are consistent with Grade I diastolic dysfunction (impaired relaxation). Indeterminate filling pressures.   Right Ventricle: The right ventricular size is normal. No increase in right ventricular wall thickness. Right ventricular systolic function is normal.   Left Atrium: Left atrial size was normal in size.   Right Atrium: Right atrial size was normal in size.   Pericardium: Trivial pericardial effusion is present. The pericardial effusion is circumferential.   Mitral Valve: The mitral valve is grossly normal. Trivial mitral valve regurgitation. No evidence of mitral valve stenosis.   Tricuspid Valve: The tricuspid valve is grossly normal. Tricuspid valve regurgitation is not demonstrated. No evidence of tricuspid stenosis.   Aortic Valve: The aortic valve is tricuspid. Aortic valve regurgitation is not visualized. No aortic stenosis is present.   Pulmonic Valve: The pulmonic valve was grossly normal. Pulmonic valve regurgitation is not visualized. No evidence of pulmonic stenosis.   Aorta: The aortic root and ascending aorta are structurally normal, with  no evidence of dilitation.   Venous: The inferior vena cava is normal in size with greater than 50%  respiratory variability, suggesting right atrial pressure of 3 mmHg.   IAS/Shunts: No atrial level shunt detected by color flow Doppler.   Cardiac MR 07/01/2020 Narrative & Impression  CLINICAL DATA:  Prominent suspected lipomatous atrial septal hypertrophy.   EXAM: CARDIAC MRI   TECHNIQUE: The patient  was scanned on a 1.5 Tesla GE magnet. A dedicated cardiac coil was used. Functional imaging was done using Fiesta sequences. 2,3, and 4 chamber views were done to assess for RWMA's. Modified Simpson's rule using a short axis stack was used to calculate an ejection fraction on a dedicated work Conservation officer, nature. The patient received 10 cc of Gadavist. After 10 minutes inversion recovery sequences were used to assess for infiltration and scar tissue.   CONTRAST:  10 cc Gadavist   FINDINGS: Limited images of the lung fields show no gross abnormalities.   Very prominent epicardial adipose tissue noted. Normal left ventricular size and wall thickness. Normal wall motion with EF 67%. Normal right ventricular size and systolic function, EF [id number]. Normal right and left atrial sizes. There is prominent lipomatous atrial septal hypertrophy that extends into the right atrial free wall. Trileaflet aortic valve with no stenosis or regurgitation. Trivial mitral regurgitation.   On delayed enhancement imaging, there is no myocardial late gadolinium enhancement (LGE).   Measurements:   LVEDV 122 mL   LVSV 81 mL LVEF 67%   RVEDV 91 mL RVSV 63 mL RVEF 69%   IMPRESSION: 1. Very prominent epicardial adipose tissue as well as lipomatous atrial septal hypertrophy extending into the right atrial free wall.   2.  Normal LV size and systolic function, EF [id number].   3.  Normal RV size and systolic function, EF [id number].   4. No myocardial LGE, so no definitive  evidence for prior MI, infiltrative disease, or myocarditis.   Dalton Mclean     CCTA  Aorta: Normal size. Mild root and descending calcifications/ atherosclerosis (dense arch atherosclerosis seen on chest CT). No dissection.   Aortic Valve:  Trileaflet.  No calcifications.   Coronary Arteries:  Normal coronary origin.  Right dominance.   RCA is a large dominant artery that gives rise to PDA and PLA. There is no plaque.    Left main is a large artery that gives rise to LAD and LCX arteries.   LAD is a large vessel that has proximal (25-49%) and scattered mid calcified lesions (up to 50-74% at bifurcation of large mid diagonal branch). Sending for FFR analysis.   LCX is a non-dominant artery that gives rise to one large OM1 branch. There is calcified proximal plaque 0-24% non flow limiting.   Other findings:   Normal pulmonary vein drainage into the left atrium with accessory right middle pulmonary vein.   Normal left atrial appendage without a thrombus.   Normal size of the pulmonary artery.   There is moderate pericardial fat diffusely.   There is lipomatous hypertrophy of interatrial septum with significant fatty deposition.   There is hypodense mixing artifact in right atrium and right ventricle.   Please see radiology report for non cardiac findings.   IMPRESSION: 1. Coronary calcium score of 131. This was 56 percentile for age and sex matched control.   2. Normal coronary origin with right dominance.   3. Normal RCA, scattered LAD calcified plaque up to 74% at bifurcation of mid diagonal (sending for FFR) and non flow limiting proximal circumflex stenosis (0-24%).   4.  Aortic atherosclerosis.   5. Lipomatous hypertrophy of interatrial septum. Given prominent nature of fatty deposition, recommend cardiac MRI for further clarification.   Candee Furbish, MD [facility]     Electronically Signed   By: Candee Furbish MD   On: 05/14/2020 16:44     Laboratory Data:  High Sensitivity Troponin:   Recent Labs  Lab 01/23/23 1808 01/23/23 1952  TROPONINIHS 58 16     ChemistryNo results for input(s): "NA", "K", "CL", "CO2", "GLUCOSE", "BUN", "CREATININE", "CALCIUM", "MG", "GFRNONAA", "GFRAA", "ANIONGAP" in the last 168 hours.  No results for input(s): "PROT", "ALBUMIN", "AST", "ALT", "ALKPHOS", "BILITOT" in the last 168 hours. Lipids No results for input(s): "CHOL", "TRIG", "HDL",  "LABVLDL", "LDLCALC", "CHOLHDL" in the last 168 hours.  Hematology Recent Labs  Lab 02/01/23 1553  WBC 11.3*  RBC 4.56  HGB 13.5  HCT 40.4  MCV 88.6  MCH 29.6  MCHC 33.4  RDW 14.4  PLT 336   Thyroid No results for input(s): "TSH", "FREET4" in the last 168 hours.  BNPNo results for input(s): "BNP", "PROBNP" in the last 168 hours.  DDimer No results for input(s): "DDIMER" in the last 168 hours.   Radiology/Studies:  DG Chest 2 View  Result Date: 02/01/2023 CLINICAL DATA:  Shortness of breath.  Fall last night. EXAM: CHEST - 2 VIEW COMPARISON:  01/23/2023 FINDINGS: Lungs are adequately inflated without focal airspace consolidation, effusion or pneumothorax. Cardiomediastinal silhouette is normal. No acute fracture. IMPRESSION: No active cardiopulmonary disease. Electronically Signed   By: Marin Olp M.D.   On: 02/01/2023 14:41     Assessment and Plan:   Ventricular tachycardia  Syncope  CAD  Hypertension  Hyperlipidemia   Patient seen examined at bedside.  Recent episode of ventricular tachycardia and syncope.  She was quite symptomatic when she presented to the  ED but that has resolved.  She is now on amiodarone we will continue amiodarone drip. She will require an ischemic evaluation, will put the patient in for a left heart catheterization tomorrow.  Will start aspirin 81 mg daily, does not appear that she is on aspirin at home we will continue her rosuvastatin 20 mg daily, she also takes Lasix clinically she does not appear to be fluid overloaded we will restart that.  Will start low-dose metoprolol titrate 12.5 mg twice daily.  I discussed with the patient and her daughter about the plan for ischemic evaluation and they are both agreeable for this.  Shared Decision Making/Informed Consent The risks [stroke (1 in 1000), death (1 in 1000), kidney failure [usually temporary] (1 in 500), bleeding (1 in 200), allergic reaction [possibly serious] (1 in 200)], benefits  (diagnostic support and management of coronary artery disease) and alternatives of a cardiac catheterization were discussed in detail with Ms. Auger and she is willing to proceed.    Admit inpatient for greater than 2 midnight DVT prophylaxis -low molecular weight heparin CODE STATUS: Full  Risk Assessment/Risk Scores:          For questions or updates, please contact [facility] Please consult [email or web address] for contact info under    Signed, Margie Billet, NP  02/01/2023 4:52 PM

## 2023-02-02 ENCOUNTER — Encounter (HOSPITAL_COMMUNITY)
Admission: EM | Disposition: A | Discharge status: Home / Self Care | Payer: Self-pay | Source: Home / Self Care | Attending: Cardiology

## 2023-02-02 DIAGNOSIS — I472 Ventricular tachycardia, unspecified: Secondary | ICD-10-CM | POA: Diagnosis not present

## 2023-02-02 HISTORY — PX: LEFT HEART CATH AND CORONARY ANGIOGRAPHY: CATH118249

## 2023-02-02 LAB — BASIC METABOLIC PANEL
Anion gap: 13 (ref 5–15)
BUN: 10 mg/dL (ref 8–23)
CO2: 26 mmol/L (ref 22–32)
Calcium: 8.8 mg/dL — ABNORMAL LOW (ref 8.9–10.3)
Chloride: 94 mmol/L — ABNORMAL LOW (ref 98–111)
Creatinine, Ser: 1.66 mg/dL — ABNORMAL HIGH (ref 0.44–1.00)
GFR, Estimated: 32 mL/min — ABNORMAL LOW (ref >60)
Glucose, Bld: 133 mg/dL — ABNORMAL HIGH (ref 70–99)
Potassium: 3 mmol/L — ABNORMAL LOW (ref 3.5–5.1)
Sodium: 133 mmol/L — ABNORMAL LOW (ref 135–145)

## 2023-02-02 LAB — CBC
HCT: 39 % (ref 36.0–46.0)
Hemoglobin: 12.8 g/dL (ref 12.0–15.0)
MCH: 28.9 pg (ref 26.0–34.0)
MCHC: 32.8 g/dL (ref 30.0–36.0)
MCV: 88 fL (ref 80.0–100.0)
Platelets: 317 10*3/uL (ref 150–400)
RBC: 4.43 MIL/uL (ref 3.87–5.11)
RDW: 14.6 % (ref 11.5–15.5)
WBC: 10.9 10*3/uL — ABNORMAL HIGH (ref 4.0–10.5)
nRBC: 0 % (ref 0.0–0.2)

## 2023-02-02 LAB — GLUCOSE, CAPILLARY
Glucose-Capillary: 137 mg/dL — ABNORMAL HIGH (ref 70–99)
Glucose-Capillary: 100 mg/dL — ABNORMAL HIGH (ref 70–99)

## 2023-02-02 LAB — MRSA NEXT GEN BY PCR, NASAL: MRSA by PCR Next Gen: NOT DETECTED

## 2023-02-02 SURGERY — LEFT HEART CATH AND CORONARY ANGIOGRAPHY
Anesthesia: LOCAL

## 2023-02-02 MED ORDER — MELATONIN 5 MG PO TABS
5.0000 mg | ORAL_TABLET | Freq: Every day | ORAL | Status: DC
  Administered 2023-02-02 – 2023-02-08 (×8): 5 mg via ORAL
  Filled 2023-02-02 (×8): qty 1

## 2023-02-02 MED ORDER — HEPARIN SODIUM (PORCINE) 1000 UNIT/ML IJ SOLN
INTRAMUSCULAR | Status: AC
  Filled 2023-02-02: qty 10

## 2023-02-02 MED ORDER — LIDOCAINE BOLUS VIA INFUSION
50.0000 mg | Freq: Once | INTRAVENOUS | Status: AC
  Administered 2023-02-02: 50 mg via INTRAVENOUS
  Filled 2023-02-02: qty 52

## 2023-02-02 MED ORDER — SODIUM CHLORIDE 0.9% FLUSH: 3.0000 mL | INTRAVENOUS | Status: DC | PRN

## 2023-02-02 MED ORDER — HEPARIN (PORCINE) IN NACL 1000-0.9 UT/500ML-% IV SOLN
INTRAVENOUS | Status: DC | PRN
  Administered 2023-02-02 (×2): 500 mL

## 2023-02-02 MED ORDER — SODIUM CHLORIDE 0.9 % IV SOLN: 250.0000 mL | INTRAVENOUS | Status: DC | PRN

## 2023-02-02 MED ORDER — ONDANSETRON HCL 4 MG/2ML IJ SOLN: 4.0000 mg | Freq: Four times a day (QID) | INTRAMUSCULAR | Status: DC | PRN

## 2023-02-02 MED ORDER — CHLORHEXIDINE GLUCONATE CLOTH 2 % EX PADS
6.0000 | MEDICATED_PAD | Freq: Every day | CUTANEOUS | Status: DC
  Administered 2023-02-02 – 2023-02-09 (×8): 6 via TOPICAL

## 2023-02-02 MED ORDER — IOHEXOL 350 MG/ML SOLN
INTRAVENOUS | Status: DC | PRN
  Administered 2023-02-02: 40 mL

## 2023-02-02 MED ORDER — FENTANYL CITRATE (PF) 100 MCG/2ML IJ SOLN
INTRAMUSCULAR | Status: DC | PRN
  Administered 2023-02-02: 25 ug via INTRAVENOUS

## 2023-02-02 MED ORDER — POTASSIUM CHLORIDE CRYS ER 20 MEQ PO TBCR
40.0000 meq | EXTENDED_RELEASE_TABLET | Freq: Two times a day (BID) | ORAL | Status: DC
  Administered 2023-02-02 – 2023-02-05 (×8): 40 meq via ORAL
  Filled 2023-02-02 (×9): qty 2

## 2023-02-02 MED ORDER — LABETALOL HCL 5 MG/ML IV SOLN: 10.0000 mg | INTRAVENOUS | Status: AC | PRN

## 2023-02-02 MED ORDER — ALBUTEROL SULFATE (2.5 MG/3ML) 0.083% IN NEBU: 3.0000 mL | INHALATION_SOLUTION | RESPIRATORY_TRACT | Status: DC | PRN

## 2023-02-02 MED ORDER — HEPARIN (PORCINE) IN NACL 2000-0.9 UNIT/L-% IV SOLN
INTRAVENOUS | Status: AC
  Filled 2023-02-02: qty 2000

## 2023-02-02 MED ORDER — SODIUM CHLORIDE 0.9% FLUSH: 3.0000 mL | Freq: Two times a day (BID) | INTRAVENOUS | Status: DC

## 2023-02-02 MED ORDER — ACETAMINOPHEN 325 MG PO TABS
650.0000 mg | ORAL_TABLET | ORAL | Status: DC | PRN
  Administered 2023-02-03: 650 mg via ORAL
  Filled 2023-02-02: qty 2

## 2023-02-02 MED ORDER — DIAZEPAM 5 MG PO TABS
5.0000 mg | ORAL_TABLET | Freq: Four times a day (QID) | ORAL | Status: DC | PRN
  Administered 2023-02-08: 5 mg via ORAL
  Filled 2023-02-02: qty 1

## 2023-02-02 MED ORDER — LIDOCAINE IN D5W 4-5 MG/ML-% IV SOLN
1.0000 mg/min | INTRAVENOUS | Status: DC
  Administered 2023-02-02: 1 mg/min via INTRAVENOUS
  Filled 2023-02-02: qty 500

## 2023-02-02 MED ORDER — SODIUM CHLORIDE 0.9% FLUSH
3.0000 mL | Freq: Two times a day (BID) | INTRAVENOUS | Status: DC
  Administered 2023-02-02 – 2023-02-09 (×13): 3 mL via INTRAVENOUS

## 2023-02-02 MED ORDER — SODIUM CHLORIDE 0.9 % WEIGHT BASED INFUSION
1.0000 mL/kg/h | INTRAVENOUS | Status: DC
  Administered 2023-02-02 (×2): 1 mL/kg/h via INTRAVENOUS

## 2023-02-02 MED ORDER — SODIUM CHLORIDE 0.9 % IV SOLN: INTRAVENOUS | Status: AC

## 2023-02-02 MED ORDER — ASPIRIN 81 MG PO CHEW
81.0000 mg | CHEWABLE_TABLET | ORAL | Status: AC
  Administered 2023-02-02: 81 mg via ORAL
  Filled 2023-02-02: qty 1

## 2023-02-02 MED ORDER — HYDRALAZINE HCL 20 MG/ML IJ SOLN: 10.0000 mg | INTRAMUSCULAR | Status: AC | PRN

## 2023-02-02 MED ORDER — MIDAZOLAM HCL 2 MG/2ML IJ SOLN
INTRAMUSCULAR | Status: DC | PRN
  Administered 2023-02-02: 1 mg via INTRAVENOUS

## 2023-02-02 MED ORDER — VERAPAMIL HCL 2.5 MG/ML IV SOLN
INTRAVENOUS | Status: AC
  Filled 2023-02-02: qty 2

## 2023-02-02 MED ORDER — SODIUM CHLORIDE 0.9 % WEIGHT BASED INFUSION
3.0000 mL/kg/h | INTRAVENOUS | Status: DC
  Administered 2023-02-02: 3 mL/kg/h via INTRAVENOUS

## 2023-02-02 MED ORDER — POTASSIUM CHLORIDE 10 MEQ/100ML IV SOLN
10.0000 meq | INTRAVENOUS | Status: AC
  Administered 2023-02-02 (×4): 10 meq via INTRAVENOUS
  Filled 2023-02-02 (×4): qty 100

## 2023-02-02 MED ORDER — LIDOCAINE HCL (PF) 1 % IJ SOLN
INTRAMUSCULAR | Status: AC
  Filled 2023-02-02: qty 30

## 2023-02-02 MED ORDER — LIP MEDEX EX OINT
TOPICAL_OINTMENT | CUTANEOUS | Status: DC | PRN
  Filled 2023-02-02: qty 7

## 2023-02-02 MED ORDER — MIDAZOLAM HCL 2 MG/2ML IJ SOLN
INTRAMUSCULAR | Status: AC
  Filled 2023-02-02: qty 2

## 2023-02-02 MED ORDER — LIDOCAINE HCL (PF) 1 % IJ SOLN
INTRAMUSCULAR | Status: DC | PRN
  Administered 2023-02-02: 20 mL

## 2023-02-02 MED ORDER — FENTANYL CITRATE (PF) 100 MCG/2ML IJ SOLN
INTRAMUSCULAR | Status: AC
  Filled 2023-02-02: qty 2

## 2023-02-02 SURGICAL SUPPLY — 10 items
CATH INFINITI 5FR MULTPACK ANG (CATHETERS) IMPLANT
CLOSURE MYNX CONTROL 5F (Vascular Products) IMPLANT
ELECT DEFIB PAD ADLT CADENCE (PAD) IMPLANT
KIT HEART LEFT (KITS) ×2 IMPLANT
MAT PREVALON FULL STRYKER (MISCELLANEOUS) IMPLANT
PACK CARDIAC CATHETERIZATION (CUSTOM PROCEDURE TRAY) ×2 IMPLANT
SHEATH PINNACLE 5F 10CM (SHEATH) IMPLANT
TRANSDUCER W/STOPCOCK (MISCELLANEOUS) ×2 IMPLANT
TUBING CIL FLEX 10 FLL-RA (TUBING) ×2 IMPLANT
WIRE EMERALD 3MM-J .035X150CM (WIRE) IMPLANT

## 2023-02-02 NOTE — Interval H&P Note (Signed)
Cath Lab Visit (complete for each Cath Lab visit)  Clinical Evaluation Leading to the Procedure:   ACS: No.  Non-ACS:    Anginal Classification: CCS I  Anti-ischemic medical therapy: No Therapy  Non-Invasive Test Results: No non-invasive testing performed  Prior CABG: No previous CABG      History and Physical Interval Note:  02/02/2023 4:48 PM  Kimberly Waters  has presented today for surgery, with the diagnosis of VT.  The various methods of treatment have been discussed with the patient and family. After consideration of risks, benefits and other options for treatment, the patient has consented to  Procedure(s): LEFT HEART CATH AND CORONARY ANGIOGRAPHY (N/A) as a surgical intervention.  The patient's history has been reviewed, patient examined, no change in status, stable for surgery.  I have reviewed the patient's chart and labs.  Questions were answered to the patient's satisfaction.     Shelva Majestic

## 2023-02-02 NOTE — Progress Notes (Signed)
Rounding Note    Patient Name: Kimberly Waters Date of Encounter: 02/02/2023  [facility] Cardiologist: Candee Furbish, MD   Subjective   No recurrence of VT since addition of lidocaine. Preparing for cath today.   Inpatient Medications    Scheduled Meds:  arformoterol  15 mcg Nebulization BID   aspirin EC  81 mg Oral Daily   buPROPion  150 mg Oral Daily   Chlorhexidine Gluconate Cloth  6 each Topical Daily   escitalopram  10 mg Oral Daily   ezetimibe  10 mg Oral Daily   furosemide  40 mg Oral Daily   heparin  5,000 Units Subcutaneous Q8H   hydrOXYzine  25 mg Oral QHS   levothyroxine  112 mcg Oral Q0600   melatonin  5 mg Oral QHS   metoprolol tartrate  12.5 mg Oral BID   potassium chloride  40 mEq Oral BID   rosuvastatin  20 mg Oral Daily   sodium chloride flush  3 mL Intravenous Q12H   Continuous Infusions:  sodium chloride 1 mL/kg/hr (02/02/23 0800)   amiodarone 30 mg/hr (02/02/23 0800)   lidocaine 1 mg/min (02/02/23 0800)   PRN Meds:    Vital Signs    Vitals:   02/02/23 0747 02/02/23 0800 02/02/23 0900 02/02/23 1116  BP:  (!) 109/55 134/73   Pulse:  (!) 47 (!) 48   Resp: 14 15 14   $ Temp:    97.7 F (36.5 C)  TempSrc:    Oral  SpO2:  99% 97%   Weight:      Height:        Intake/Output Summary (Last 24 hours) at 02/02/2023 1132 Last data filed at 02/02/2023 0920 Gross per 24 hour  Intake 1536.12 ml  Output 550 ml  Net 986.12 ml      02/02/2023    2:42 AM 02/01/2023    8:11 PM 02/01/2023    1:27 PM  Last 3 Weights  Weight (lbs) 195 lb 15.8 oz 191 lb 9.3 oz 188 lb 0.8 oz  Weight (kg) 88.9 kg 86.9 kg 85.3 kg      Telemetry    Sinus brady - Personally Reviewed  ECG    No new - Personally Reviewed  Physical Exam   GEN: No acute distress.   Neck: No JVD Cardiac: RRR, no murmurs, rubs, or gallops.  Respiratory: Clear to auscultation bilaterally. GI: Soft, nontender, non-distended  MS: No edema; No deformity. Neuro:  Nonfocal  Psych:  Normal affect   Labs    High Sensitivity Troponin:   Recent Labs  Lab 01/23/23 1808 01/23/23 1952 02/01/23 1553 02/01/23 1800  TROPONINIHS 15 16 21* 19*     Chemistry Recent Labs  Lab 02/01/23 1553 02/01/23 1800 02/02/23 0017  NA 138  --  133*  K 2.8*  --  3.0*  CL 97*  --  94*  CO2 28  --  26  GLUCOSE 117*  --  133*  BUN 16  --  10  CREATININE 1.61* 1.62* 1.66*  CALCIUM 9.2  --  8.8*  MG  --  2.0  --   GFRNONAA 33* 33* 32*  ANIONGAP 13  --  13    Lipids No results for input(s): "CHOL", "TRIG", "HDL", "LABVLDL", "LDLCALC", "CHOLHDL" in the last 168 hours.  Hematology Recent Labs  Lab 02/01/23 1553 02/01/23 1800 02/02/23 0017  WBC 11.3* 10.6* 10.9*  RBC 4.56 4.52 4.43  HGB 13.5 13.1 12.8  HCT 40.4 40.9 39.0  MCV 88.6 90.5 88.0  MCH 29.6 29.0 28.9  MCHC 33.4 32.0 32.8  RDW 14.4 14.6 14.6  PLT 336 243 317   Thyroid  Recent Labs  Lab 02/01/23 1700 02/01/23 1800  TSH 66.430*  --   FREET4  --  <0.25*    BNP Recent Labs  Lab 02/01/23 1553 02/01/23 1755  BNP 45.9 44.8    DDimer No results for input(s): "DDIMER" in the last 168 hours.   Radiology    CT Head Wo Contrast  Result Date: 02/01/2023 CLINICAL DATA:  Initial evaluation for acute posttraumatic headache. EXAM: CT HEAD WITHOUT CONTRAST TECHNIQUE: Contiguous axial images were obtained from the base of the skull through the vertex without intravenous contrast. RADIATION DOSE REDUCTION: This exam was performed according to the departmental dose-optimization program which includes automated exposure control, adjustment of the mA and/or kV according to patient size and/or use of iterative reconstruction technique. COMPARISON:  Prior study from 01/24/2023. FINDINGS: Brain: Mild age-related cerebral atrophy with chronic small vessel ischemic disease. No acute intracranial hemorrhage. No acute large vessel territory infarct. No mass lesion, mass effect or midline shift. No hydrocephalus or extra-axial fluid  collection. Vascular: No abnormal hyperdense vessel. Skull: Scalp soft tissues demonstrate no acute finding. Calvarium intact. Sinuses/Orbits: Globes orbital soft tissues within normal limits. Paranasal sinuses and mastoid air cells are largely clear. Other: None. IMPRESSION: 1. No acute intracranial abnormality. 2. Generalized age-related cerebral atrophy with mild chronic small vessel ischemic disease. Electronically Signed   By: Jeannine Boga M.D.   On: 02/01/2023 21:57   DG Chest 2 View  Result Date: 02/01/2023 CLINICAL DATA:  Shortness of breath.  Fall last night. EXAM: CHEST - 2 VIEW COMPARISON:  01/23/2023 FINDINGS: Lungs are adequately inflated without focal airspace consolidation, effusion or pneumothorax. Cardiomediastinal silhouette is normal. No acute fracture. IMPRESSION: No active cardiopulmonary disease. Electronically Signed   By: Marin Olp M.D.   On: 02/01/2023 14:41    Cardiac Studies   01/24/23 echo 1. Left ventricular ejection fraction, by estimation, is 50 to 55%. Left  ventricular ejection fraction by PLAX is 59 %. The left ventricle has low  normal function. The left ventricle has no regional wall motion  abnormalities. There is mild concentric  left ventricular hypertrophy. Left ventricular diastolic parameters are  consistent with Grade I diastolic dysfunction (impaired relaxation).   2. Right ventricular systolic function is normal. The right ventricular  size is normal.   3. The mitral valve is grossly normal. Trivial mitral valve  regurgitation. No evidence of mitral stenosis.   4. The aortic valve is tricuspid. Aortic valve regurgitation is not  visualized. No aortic stenosis is present.   5. The inferior vena cava is normal in size with greater than 50%  respiratory variability, suggesting right atrial pressure of 3 mmHg.  Patient Profile     77 y.o. female with a hx of CAD, LBBB, COPD, breast cancer s/p mastectomy, GERD, depression,chronic diastolic  heart failure,  who is being seen 02/01/2023 for the evaluation of syncope and VT at the request of Dr Pearline Cables.  Assessment & Plan    Principal Problem:   Ventricular tachycardia ([place])  -PMVT in setting of hypokalemia. On amio and lidocaine gtt currently, with no recurrence of VT.  -getting ischemic workup today with LHC per admitting team. Consent performed by Dr. Harriet Masson and confirmed today.  - repleting potassium, consider istat prior to cath.    For questions or updates, please contact [place] Please  consult [email or web address] for contact info under        Signed, Elouise Munroe, MD  02/02/2023, 11:32 AM

## 2023-02-02 NOTE — Progress Notes (Signed)
   Called regarding patient HR mostly in the 40s, sinus rhythm. Has been on lidocaine along with amiodarone in the of PMVT. S/p cath with no CAD. Overall, rhythm stable throughout the day. With bradycardia, will withdrawal agents slowly and stop the lidocaine drip first to ensure continued stable rhythm.   SignedReino Bellis, NP-C 02/02/2023, 7:25 PM Pager: [phone]

## 2023-02-02 NOTE — Progress Notes (Signed)
Order for lidocaine gtt for NSVT. Lidocaine started at 48m/min with 533mbolus per MD order. Plan for transfer to [facility].

## 2023-02-02 NOTE — H&P (Signed)
We documented the consult note as an h&p

## 2023-02-03 DIAGNOSIS — I4721 Torsades de pointes: Secondary | ICD-10-CM | POA: Insufficient documentation

## 2023-02-03 DIAGNOSIS — E039 Hypothyroidism, unspecified: Secondary | ICD-10-CM

## 2023-02-03 DIAGNOSIS — I472 Ventricular tachycardia, unspecified: Secondary | ICD-10-CM | POA: Diagnosis not present

## 2023-02-03 LAB — CBC
HCT: 36.8 % (ref 36.0–46.0)
Hemoglobin: 11.5 g/dL — ABNORMAL LOW (ref 12.0–15.0)
MCH: 28.9 pg (ref 26.0–34.0)
MCHC: 31.3 g/dL (ref 30.0–36.0)
MCV: 92.5 fL (ref 80.0–100.0)
Platelets: 255 10*3/uL (ref 150–400)
RBC: 3.98 MIL/uL (ref 3.87–5.11)
RDW: 15.2 % (ref 11.5–15.5)
WBC: 10.7 10*3/uL — ABNORMAL HIGH (ref 4.0–10.5)
nRBC: 0 % (ref 0.0–0.2)

## 2023-02-03 LAB — MAGNESIUM: Magnesium: 2.1 mg/dL (ref 1.7–2.4)

## 2023-02-03 LAB — BASIC METABOLIC PANEL
Anion gap: 12 (ref 5–15)
Anion gap: 14 (ref 5–15)
BUN: 15 mg/dL (ref 8–23)
BUN: 15 mg/dL (ref 8–23)
CO2: 23 mmol/L (ref 22–32)
CO2: 24 mmol/L (ref 22–32)
Calcium: 7.8 mg/dL — ABNORMAL LOW (ref 8.9–10.3)
Calcium: 8.8 mg/dL — ABNORMAL LOW (ref 8.9–10.3)
Chloride: 101 mmol/L (ref 98–111)
Chloride: 98 mmol/L (ref 98–111)
Creatinine, Ser: 1.48 mg/dL — ABNORMAL HIGH (ref 0.44–1.00)
Creatinine, Ser: 1.47 mg/dL — ABNORMAL HIGH (ref 0.44–1.00)
GFR, Estimated: 37 mL/min — ABNORMAL LOW (ref >60)
GFR, Estimated: 37 mL/min — ABNORMAL LOW (ref >60)
Glucose, Bld: 128 mg/dL — ABNORMAL HIGH (ref 70–99)
Glucose, Bld: 123 mg/dL — ABNORMAL HIGH (ref 70–99)
Potassium: 3.9 mmol/L (ref 3.5–5.1)
Potassium: 3.9 mmol/L (ref 3.5–5.1)
Sodium: 136 mmol/L (ref 135–145)
Sodium: 136 mmol/L (ref 135–145)

## 2023-02-03 MED ORDER — LEVOTHYROXINE SODIUM 88 MCG PO TABS
88.0000 ug | ORAL_TABLET | Freq: Once | ORAL | Status: AC
  Administered 2023-02-03: 88 ug via ORAL
  Filled 2023-02-03: qty 1

## 2023-02-03 MED ORDER — ISOPROTERENOL HCL 0.2 MG/ML IJ SOLN
2.0000 ug/min | INTRAVENOUS | Status: DC
  Administered 2023-02-03: 2 ug/min via INTRAVENOUS
  Administered 2023-02-03: 4 ug/min via INTRAVENOUS
  Administered 2023-02-04 (×4): 6 ug/min via INTRAVENOUS
  Administered 2023-02-04: 5 ug/min via INTRAVENOUS
  Administered 2023-02-04 – 2023-02-05 (×5): 6 ug/min via INTRAVENOUS
  Administered 2023-02-05: 2 ug/min via INTRAVENOUS
  Filled 2023-02-03 (×15): qty 5

## 2023-02-03 MED ORDER — MAGNESIUM SULFATE 2 GM/50ML IV SOLN
2.0000 g | Freq: Once | INTRAVENOUS | Status: AC
  Administered 2023-02-03: 2 g via INTRAVENOUS
  Filled 2023-02-03: qty 50

## 2023-02-03 MED ORDER — AMIODARONE HCL 200 MG PO TABS
400.0000 mg | ORAL_TABLET | Freq: Three times a day (TID) | ORAL | Status: DC
  Administered 2023-02-03 (×2): 400 mg via ORAL
  Filled 2023-02-03 (×2): qty 2

## 2023-02-03 MED ORDER — METHIMAZOLE 10 MG PO TABS: 10.0000 mg | ORAL_TABLET | Freq: Three times a day (TID) | ORAL | Status: DC

## 2023-02-03 MED ORDER — PANTOPRAZOLE SODIUM 40 MG PO TBEC
40.0000 mg | DELAYED_RELEASE_TABLET | Freq: Every day | ORAL | Status: DC
  Administered 2023-02-03 – 2023-02-09 (×7): 40 mg via ORAL
  Filled 2023-02-03 (×7): qty 1

## 2023-02-03 MED ORDER — LEVOTHYROXINE SODIUM 100 MCG PO TABS
200.0000 ug | ORAL_TABLET | Freq: Every day | ORAL | Status: DC
  Administered 2023-02-04 – 2023-02-09 (×6): 200 ug via ORAL
  Filled 2023-02-03 (×6): qty 2

## 2023-02-03 MED ORDER — LIOTHYRONINE SODIUM 25 MCG PO TABS
25.0000 ug | ORAL_TABLET | Freq: Two times a day (BID) | ORAL | Status: AC
  Administered 2023-02-03 – 2023-02-05 (×4): 25 ug via ORAL
  Filled 2023-02-03 (×4): qty 1

## 2023-02-03 MED ORDER — LIDOCAINE BOLUS VIA INFUSION
50.0000 mg | Freq: Once | INTRAVENOUS | Status: AC
  Administered 2023-02-03: 50 mg via INTRAVENOUS
  Filled 2023-02-03: qty 52

## 2023-02-03 MED ORDER — LIDOCAINE IN D5W 4-5 MG/ML-% IV SOLN
0.5000 mg/min | INTRAVENOUS | Status: DC
  Administered 2023-02-03: 1 mg/min via INTRAVENOUS
  Administered 2023-02-05: 0.5 mg/min via INTRAVENOUS
  Filled 2023-02-03 (×2): qty 500

## 2023-02-03 NOTE — Progress Notes (Signed)
   02/03/23 1456  Spiritual Encounters  Type of Visit Initial  Conversation partners present during encounter Physician;Nurse  Referral source Code page  Reason for visit Code  OnCall Visit Yes   Responded to code blue -

## 2023-02-03 NOTE — Consult Note (Signed)
Full note to follow.  Impression   torsade de pointes  Hypothyroidism-profound  Hypokalemia-resolved  IVCD  Bradycardia-relative  The patient has torsade de pointes.  It is long short coupled, QT intervals range between 550 is 650 ms and the coupling interval of the PVC initiating the polymorphic ventricular tachycardia is over 500 ms which is relatively very specific for torsade de pointes as a mechanism.  That being the question, the issue is why.  QT interval 2/23 was normal.  One of the things that is noteworthy is that she is profoundly hypothyroid.  She has been followed closely by Dr. Mertha Finders at [place], these labs are not currently available.  Her levothyroxine dose was recently increased.  TSH is currently 65+.  And it turns out with a literature search that hypothyroidism is associated with QT prolongation.  It may also be aggravating his secondarily by way of a bradycardia.  Hence, the strategies include 1 aggressive thyroid repletion.  Literature search again suggested a combination of high-dose levothyroxine at this 1.3 mics per kilo in addition to liothyronine.  These patients were de novo identified, so we are going to extrapolate these data and increase her levothyroxine dose from 112--200 mcg the next week.  We will also use liothyronine 25 twice daily for 48 hours in the hopes of trying to reverse what ever part of her QT prolongation is related to the hypothyroidism.  2 IV mag to stabilize QT issues  3.  IV lidocaine which also shortness QT interval's.  Have to be cognizant about impact on neurotoxicity so we will use 1 mg/min 4.  IV isoproterenol to a target heart rate of 80 which will also shorten the QT interval. 5.  Discontinue the amiodarone

## 2023-02-03 NOTE — Progress Notes (Signed)
eLink Physician-Brief Progress Note Patient Name: Kimberly Waters DOB: 1946-03-07 MRN: [id number]   Date of Service  02/03/2023  HPI/Events of Note  Patient initially admitted for ventricular tachycardia.  Has been bradycardic throughout the day.  Remains otherwise hemodynamically stable with normotension.  Clean cardiac cath today.  Off lidocaine.  Heart rate 45-50  eICU Interventions  Cardiology to adjust amnio drip.     Intervention Category Intermediate Interventions: Arrhythmia - evaluation and management  Delsy Etzkorn 02/03/2023, 12:05 AM

## 2023-02-03 NOTE — Progress Notes (Addendum)
Rounding Note    Patient Name: Kimberly Waters Date of Encounter: 02/03/2023  [facility] Cardiologist: Candee Furbish, MD   Subjective   Feeling well sitting on side of bed. No recurrence of VT, however bradycardic and continuing on amio.  Inpatient Medications    Scheduled Meds:  amiodarone  400 mg Oral TID   arformoterol  15 mcg Nebulization BID   aspirin EC  81 mg Oral Daily   buPROPion  150 mg Oral Daily   Chlorhexidine Gluconate Cloth  6 each Topical Daily   escitalopram  10 mg Oral Daily   ezetimibe  10 mg Oral Daily   furosemide  40 mg Oral Daily   heparin  5,000 Units Subcutaneous Q8H   hydrOXYzine  25 mg Oral QHS   levothyroxine  112 mcg Oral Q0600   melatonin  5 mg Oral QHS   metoprolol tartrate  12.5 mg Oral BID   potassium chloride  40 mEq Oral BID   rosuvastatin  20 mg Oral Daily   sodium chloride flush  3 mL Intravenous Q12H   sodium chloride flush  3 mL Intravenous Q12H   Continuous Infusions:  sodium chloride     PRN Meds:    Vital Signs    Vitals:   02/03/23 0700 02/03/23 0725 02/03/23 0800 02/03/23 0818  BP: (!) 120/58  (!) 128/56   Pulse: (!) 52  60 (!) 54  Resp: 17  18 16  $ Temp:  98.3 F (36.8 C)    TempSrc:  Oral    SpO2: 98%  97%   Weight:      Height:        Intake/Output Summary (Last 24 hours) at 02/03/2023 0843 Last data filed at 02/03/2023 0200 Gross per 24 hour  Intake 2199.39 ml  Output 850 ml  Net 1349.39 ml      02/02/2023    2:42 AM 02/01/2023    8:11 PM 02/01/2023    1:27 PM  Last 3 Weights  Weight (lbs) 195 lb 15.8 oz 191 lb 9.3 oz 188 lb 0.8 oz  Weight (kg) 88.9 kg 86.9 kg 85.3 kg      Telemetry    Sinus brady - Personally Reviewed  ECG    ?probable sinus rhythm with very long PR interval. Qtc borderline prolonged - Personally Reviewed  Physical Exam   GEN: No acute distress.   Neck: No JVD Cardiac: RRR, no murmurs, rubs, or gallops.  Respiratory: Clear to auscultation bilaterally. GI: Soft,  nontender, non-distended  MS: No edema; No deformity. Neuro:  Nonfocal  Psych: Normal affect   Labs    High Sensitivity Troponin:   Recent Labs  Lab 01/23/23 1808 01/23/23 1952 02/01/23 1553 02/01/23 1800  TROPONINIHS 15 16 21* 19*     Chemistry Recent Labs  Lab 02/01/23 1553 02/01/23 1800 02/02/23 0017  NA 138  --  133*  K 2.8*  --  3.0*  CL 97*  --  94*  CO2 28  --  26  GLUCOSE 117*  --  133*  BUN 16  --  10  CREATININE 1.61* 1.62* 1.66*  CALCIUM 9.2  --  8.8*  MG  --  2.0  --   GFRNONAA 33* 33* 32*  ANIONGAP 13  --  13    Lipids No results for input(s): "CHOL", "TRIG", "HDL", "LABVLDL", "LDLCALC", "CHOLHDL" in the last 168 hours.  Hematology Recent Labs  Lab 02/01/23 1800 02/02/23 0017 02/03/23 0206  WBC 10.6* 10.9*  10.7*  RBC 4.52 4.43 3.98  HGB 13.1 12.8 11.5*  HCT 40.9 39.0 36.8  MCV 90.5 88.0 92.5  MCH 29.0 28.9 28.9  MCHC 32.0 32.8 31.3  RDW 14.6 14.6 15.2  PLT 243 317 255   Thyroid  Recent Labs  Lab 02/01/23 1700 02/01/23 1800  TSH 66.430*  --   FREET4  --  <0.25*    BNP Recent Labs  Lab 02/01/23 1553 02/01/23 1755  BNP 45.9 44.8    DDimer No results for input(s): "DDIMER" in the last 168 hours.   Radiology    CARDIAC CATHETERIZATION  Result Date: 02/02/2023 Normal coronary arteries with a dominant RCA. LVEDP 22 mmHg. RECOMMENDATION: Continue medical therapy and EP evaluation for her VT in the setting of hypokalemia and continuation of statin therapy for hyperlipidemia.   CT Head Wo Contrast  Result Date: 02/01/2023 CLINICAL DATA:  Initial evaluation for acute posttraumatic headache. EXAM: CT HEAD WITHOUT CONTRAST TECHNIQUE: Contiguous axial images were obtained from the base of the skull through the vertex without intravenous contrast. RADIATION DOSE REDUCTION: This exam was performed according to the departmental dose-optimization program which includes automated exposure control, adjustment of the mA and/or kV according to patient  size and/or use of iterative reconstruction technique. COMPARISON:  Prior study from 01/24/2023. FINDINGS: Brain: Mild age-related cerebral atrophy with chronic small vessel ischemic disease. No acute intracranial hemorrhage. No acute large vessel territory infarct. No mass lesion, mass effect or midline shift. No hydrocephalus or extra-axial fluid collection. Vascular: No abnormal hyperdense vessel. Skull: Scalp soft tissues demonstrate no acute finding. Calvarium intact. Sinuses/Orbits: Globes orbital soft tissues within normal limits. Paranasal sinuses and mastoid air cells are largely clear. Other: None. IMPRESSION: 1. No acute intracranial abnormality. 2. Generalized age-related cerebral atrophy with mild chronic small vessel ischemic disease. Electronically Signed   By: Jeannine Boga M.D.   On: 02/01/2023 21:57   DG Chest 2 View  Result Date: 02/01/2023 CLINICAL DATA:  Shortness of breath.  Fall last night. EXAM: CHEST - 2 VIEW COMPARISON:  01/23/2023 FINDINGS: Lungs are adequately inflated without focal airspace consolidation, effusion or pneumothorax. Cardiomediastinal silhouette is normal. No acute fracture. IMPRESSION: No active cardiopulmonary disease. Electronically Signed   By: Marin Olp M.D.   On: 02/01/2023 14:41    Cardiac Studies   01/24/23 echo 1. Left ventricular ejection fraction, by estimation, is 50 to 55%. Left  ventricular ejection fraction by PLAX is 59 %. The left ventricle has low  normal function. The left ventricle has no regional wall motion  abnormalities. There is mild concentric  left ventricular hypertrophy. Left ventricular diastolic parameters are  consistent with Grade I diastolic dysfunction (impaired relaxation).   2. Right ventricular systolic function is normal. The right ventricular  size is normal.   3. The mitral valve is grossly normal. Trivial mitral valve  regurgitation. No evidence of mitral stenosis.   4. The aortic valve is tricuspid.  Aortic valve regurgitation is not  visualized. No aortic stenosis is present.   5. The inferior vena cava is normal in size with greater than 50%  respiratory variability, suggesting right atrial pressure of 3 mmHg.  Patient Profile     77 y.o. female with a hx of CAD, LBBB, COPD, breast cancer s/p mastectomy, GERD, depression,chronic diastolic heart failure,  who is being seen 02/01/2023 for the evaluation of syncope and VT at the request of Dr Pearline Cables.  Assessment & Plan    Principal Problem:   Ventricular tachycardia ([place])  -  PMVT in setting of hypokalemia. On oral amiodarone. Will d/w EP regarding continued need for amio.  - no CAD - LVEDP 22, will consider dose of lasix. - repleting potassium, must likely culprit. Labs this AM are pending. -d/c metoprolol 2/2 to bradycardia. Dc zofran.  Hypothyroidism - IM consult to assist. Likely contributing to bradycardia, which may impact propensity toward torsade.   For questions or updates, please contact [place] Please consult [email or web address] for contact info under        Signed, Elouise Munroe, MD  02/03/2023, 8:43 AM    ADDENDUM: Pt had recurrent torsade this afternoon. Repeat BMET and mag. Reviewed telemetry with Dr. Caryl Comes, EP consult performed. Will plan on the following - will supplement thyroid with levothyroxine 200 mcg daily (supplemental 88 mcg today), for one week.  - liothyronine 25 mcg Bid for 48 hours.  - isoproterenol titrated to HR >80.  - lidocaine IV - IV magnesium - amiodarone discontinued.  Goal is to shorten Qt interval with torsade de pointes.   Elouise Munroe, MD 4:15 PM  CRITICAL CARE Performed by: Cherlynn Kaiser, MD   Total critical care time: 45 minutes   Critical care time was exclusive of separately billable procedures and treating other patients.   Critical care was necessary to treat or prevent imminent or life-threatening deterioration.   Critical care was time spent  personally by me (independent of APPs or residents) on the following activities: development of treatment plan with patient and/or surrogate as well as nursing, discussions with consultants, evaluation of patient's response to treatment, examination of patient, obtaining history from patient or surrogate, ordering and performing treatments and interventions, ordering and review of laboratory studies, ordering and review of radiographic studies, pulse oximetry and re-evaluation of patient's condition.

## 2023-02-03 NOTE — Consult Note (Signed)
Initial Consultation Note   Patient: Kimberly Waters [id number] DOB: 1945-12-28 PCP: Charlane Ferretti, MD DOA: 02/01/2023 DOS: the patient was seen and examined on 02/03/2023 Primary service: Berniece Salines, DO  Referring physician: Cherlynn Kaiser, MD Reason for consult: Hypothyroidism  Assessment/Plan: Assessment and Plan:  VT Acute.  Patient presented after having syncopal episode found to be in VT.  Patient underwent cardiac cath which showed no obstructive coronary artery disease. -Per cardiology  Hypothyroidism Patient was found to have TSH elevated at 66.43 with free T4 <0.25.  Records note last fill date for levothyroxine 100 mcg was on 09/09/2022 for a 90-day supply.  Levothyroxine 112 mcg p.o. daily is ordered.  Patient reports taking the medication as prescribed with all of her other medications in the morning as well as sometimes with food.  Unclear if she was actually taking medication as prescribed.  Would not necessarily increase dose rather than currently possibly has been due to risk of complications. -Continue levothyroxine at current dose -Counseled patient with son present on the need to take levothyroxine at least 2 -3 hours separate from any of her other medications and on empty stomach -Will need to recheck TSH in 4-6 weeks in the outpatient setting and possibly needs referral to endocrinology  History of breast cancer Status post right mastectomy in 2018 without note of recurrence.  Currently on anastrozole. -Continue anastrozole   TRH will continue to follow the patient.  HPI: Kimberly Waters is a 77 y.o. female with past medical history of  breast cancer s/p definitive surgery, hyperlipidemia, hypothyroidism, COPD, chronic diastolic CHF, CKD 3B, depression, and prior history of tobacco abuse who presented after having another syncopal event 2/8 admitted by cardiology.  She just recently been hospitalized from 1/30-1/31 after having recurrent episodes of syncope.  She  had been evaluated with CT of the head, echocardiogram which noted normal systolic function without valvular abnormalities, EEG, MRI of the brain thought back to her baseline.  She had been set up to follow-up in outpatient setting with cardiology and her primary care provider.  During this admission with syncope patient was found in ventricular tachycardia and started on amiodarone drip.  She was started on aspirin and low-dose metoprolol 12.5 mg twice daily.  Patient underwent heart catheterization which noted normal coronary arteries with dominant RCA. Labs did note TSH 66.43 and Free T4 <0.25.  Patient notes that she normally takes her levothyroxine with all of her other medications in the morning.  To her knowledge she does not report being on any medication with biotin.   Review of Systems: As mentioned in the history of present illness. All other systems reviewed and are negative. Past Medical History:  Diagnosis Date   Allergy    Breast cancer ([place])    Cataract    Colon polyps    Depression    Dyspnea    SEASONAL   Family history of breast cancer    Family history of colon cancer    GERD (gastroesophageal reflux disease)    History of radiation therapy 06/05/17-07/20/17   right chest wall 50.4 Gy in 28 fractions, right axillary nodal region 45 Gy in 25 fractions   Hyperlipidemia    Hypertension    Hypertensive retinopathy    Hypothyroidism    Macular degeneration    Peripheral neuropathy 06/18/2019   PONV (postoperative nausea and vomiting)    Past Surgical History:  Procedure Laterality Date   ABDOMINAL HYSTERECTOMY     KNEE SURGERY Right  MASTECTOMY W/ SENTINEL NODE BIOPSY Right 03/05/2017   Procedure: BILATERAL TOTAL MASTECTOMIES WITH RIGHT SENTINEL LYMPH NODE BIOPSIES;  Surgeon: Excell Seltzer, MD;  Location: [facility];  Service: General;  Laterality: Right;   SHOULDER SURGERY     BILAT   Social History:  reports that she quit smoking about 5 years ago. Her smoking  use included cigarettes. She has a 45.00 pack-year smoking history. She has never used smokeless tobacco. She reports that she does not drink alcohol and does not use drugs.  Allergies  Allergen Reactions   Latex Other (See Comments)    Other reaction(s): Other (See Comments) Tears skin.   Adhesive [Tape] Other (See Comments)    (skin tears)  Tolerates PAPER TAPE   Codeine Nausea Only   Other Nausea Only    Anesthesia--nausea     Family History  Problem Relation Age of Onset   Heart disease Mother 16       stents   Colon cancer Maternal Grandfather 11   Colon cancer Maternal Aunt 60   Colon cancer Maternal Uncle        dx in his 32s   Breast cancer Paternal Aunt 68   Breast cancer Cousin 60       paternal first cousin   Lung cancer Paternal Grandfather    Colon cancer Maternal Uncle    Head & neck cancer Maternal Uncle        oral cancer    Prior to Admission medications   Medication Sig Start Date End Date Taking? Authorizing Provider  albuterol (PROVENTIL,VENTOLIN) 90 MCG/ACT inhaler Inhale 2 puffs into the lungs every 4 (four) hours as needed. Patient taking differently: Inhale 2 puffs into the lungs every 4 (four) hours as needed for wheezing or shortness of breath. 04/04/11  Yes Lyndal Pulley, DO  anastrozole (ARIMIDEX) 1 MG tablet Take 1 tablet (1 mg total) by mouth daily. Stop May 2023 12/07/21  Yes Magrinat, Virgie Dad, MD  arformoterol (BROVANA) 15 MCG/2ML NEBU Take 2 mLs (15 mcg total) by nebulization 2 (two) times daily. 07/27/21  Yes Parrett, Tammy S, NP  betamethasone dipropionate [id number] % cream 1 application   Yes [provider]  buPROPion (WELLBUTRIN SR) 150 MG 12 hr tablet Take 150 mg by mouth daily.  07/09/19  Yes [provider]  clobetasol ointment (TEMOVATE) 0.05 % as needed. 07/23/20  Yes [provider]  diphenhydrAMINE (BENADRYL) 25 MG tablet Take 25 mg by mouth as needed for sleep.   Yes [provider]  escitalopram  (LEXAPRO) 10 MG tablet Take 1 tablet (10 mg total) by mouth daily. 04/04/18  Yes Geradine Girt, DO  ezetimibe (ZETIA) 10 MG tablet Take 1 tablet (10 mg total) by mouth daily. 03/08/22  Yes Jerline Pain, MD  furosemide (LASIX) 40 MG tablet Take 1 tablet (40 mg total) by mouth daily. 04/04/18  Yes Eulogio Bear U, DO  hydrOXYzine (ATARAX/VISTARIL) 25 MG tablet Take 25 mg by mouth at bedtime.   Yes [provider]  imipramine (TOFRANIL) 50 MG tablet Take 50 mg by mouth at bedtime.   Yes [provider]  ipratropium (ATROVENT) 0.06 % nasal spray Place 2 sprays into the nose 4 (four) times daily. 09/17/18  Yes Brand Males, MD  ketoconazole (NIZORAL) 2 % cream Apply 1 application topically daily. 07/21/21  Yes Magrinat, Virgie Dad, MD  levocetirizine (XYZAL) 5 MG tablet Take 5 mg by mouth as needed for allergies.   Yes [provider]  levothyroxine (SYNTHROID, LEVOTHROID) 112 MCG tablet TAKE ONE TABLET BY MOUTH ONCE DAILY 03/27/14  Yes Leone Haven, MD  mometasone (ELOCON) 0.1 % ointment Apply topically as needed. 08/04/20  Yes [provider]  nitroGLYCERIN (NITROSTAT) 0.4 MG SL tablet Place 0.4 mg under the tongue every 5 (five) minutes as needed for chest pain. 10/22/20  Yes [provider]  omeprazole (PRILOSEC) 20 MG capsule Take 20 mg by mouth daily.   Yes [provider]  Potassium Chloride ER 20 MEQ TBCR 1 tablet with food   Yes [provider]  revefenacin (YUPELRI) 175 MCG/3ML nebulizer solution Take 3 mLs (175 mcg total) by nebulization daily. 07/27/21  Yes Parrett, Tammy S, NP  rosuvastatin (CRESTOR) 20 MG tablet Take 1 tablet (20 mg total) by mouth daily. 03/08/22  Yes Jerline Pain, MD  tacrolimus (PROTOPIC) 0.1 % ointment Apply topically as needed. 08/04/20  Yes [provider]    Physical Exam: Vitals:   02/03/23 0500 02/03/23 0600 02/03/23 0700 02/03/23 0725  BP: 110/81 (!) 130/100 (!) 120/58   Pulse:  (!) 53  (!) 52   Resp: 15 16 17   $ Temp:    98.3 F (36.8 C)  TempSrc:    Oral  SpO2:  100% 98%   Weight:      Height:       Exam  Constitutional: Elderly obese female who appears to be in no acute distress Eyes: PERRL, lids and conjunctivae normal ENMT: Mucous membranes are moist.  Fair dentition.  Hard of hearing. Neck: normal, supple  Respiratory: clear to auscultation bilaterally, no wheezing, no crackles. Normal respiratory effort.  Cardiovascular: Regular rate and rhythm, no murmurs / rubs / gallops. No significant lower extremity edema. 2+ pedal pulses.   Abdomen: no tenderness, no masses palpated. No hepatosplenomegaly. Bowel sounds positive.  Musculoskeletal: no clubbing / cyanosis. No joint deformity upper and lower extremities. Good ROM, no contractures. Normal muscle tone.  Skin: no rashes, lesions, ulcers. No induration Neurologic: CN 2-12 grossly intact.   Strength 5/5 in all 4.  Psychiatric: Normal judgment and insight. Alert and oriented x 3. Normal mood.   Data Reviewed:   Reviewed labs as noted above.   Family Communication: Son updated at bedside Primary team communication: Thank you very much for involving [place] in the care of your patient.  Author: Norval Morton, MD 02/03/2023 8:05 AM  For on call review [email or web address].

## 2023-02-04 DIAGNOSIS — I472 Ventricular tachycardia, unspecified: Secondary | ICD-10-CM | POA: Diagnosis not present

## 2023-02-04 LAB — CBC
HCT: 35.5 % — ABNORMAL LOW (ref 36.0–46.0)
Hemoglobin: 11.4 g/dL — ABNORMAL LOW (ref 12.0–15.0)
MCH: 28.6 pg (ref 26.0–34.0)
MCHC: 32.1 g/dL (ref 30.0–36.0)
MCV: 89.2 fL (ref 80.0–100.0)
Platelets: 238 10*3/uL (ref 150–400)
RBC: 3.98 MIL/uL (ref 3.87–5.11)
RDW: 15.3 % (ref 11.5–15.5)
WBC: 11 10*3/uL — ABNORMAL HIGH (ref 4.0–10.5)
nRBC: 0 % (ref 0.0–0.2)

## 2023-02-04 LAB — BASIC METABOLIC PANEL WITH GFR
Anion gap: 14 (ref 5–15)
BUN: 13 mg/dL (ref 8–23)
CO2: 20 mmol/L — ABNORMAL LOW (ref 22–32)
Calcium: 9.1 mg/dL (ref 8.9–10.3)
Chloride: 104 mmol/L (ref 98–111)
Creatinine, Ser: 1.56 mg/dL — ABNORMAL HIGH (ref 0.44–1.00)
GFR, Estimated: 34 mL/min — ABNORMAL LOW
Glucose, Bld: 160 mg/dL — ABNORMAL HIGH (ref 70–99)
Potassium: 4.8 mmol/L (ref 3.5–5.1)
Sodium: 138 mmol/L (ref 135–145)

## 2023-02-04 MED ORDER — PREDNISONE 20 MG PO TABS
40.0000 mg | ORAL_TABLET | Freq: Two times a day (BID) | ORAL | Status: DC
  Administered 2023-02-04 – 2023-02-05 (×3): 40 mg via ORAL
  Filled 2023-02-04 (×3): qty 2

## 2023-02-04 MED ORDER — TRIMETHOBENZAMIDE HCL 100 MG/ML IM SOLN
200.0000 mg | Freq: Three times a day (TID) | INTRAMUSCULAR | Status: DC | PRN
  Administered 2023-02-09: 200 mg via INTRAMUSCULAR
  Filled 2023-02-04 (×3): qty 2

## 2023-02-04 MED ORDER — SENNOSIDES-DOCUSATE SODIUM 8.6-50 MG PO TABS
2.0000 | ORAL_TABLET | Freq: Two times a day (BID) | ORAL | Status: DC
  Administered 2023-02-04 – 2023-02-09 (×10): 2 via ORAL
  Filled 2023-02-04 (×10): qty 2

## 2023-02-04 NOTE — Consult Note (Signed)
Cardiology Consultation   Patient ID: Kimberly Waters MRN: [id number]; DOB: Sep 29, 1946  Admit date: 02/01/2023 Date of Consult: 02/04/2023  PCP:  Charlane Ferretti, MD   [facility] Cardiologist:  Candee Furbish, MD        Patient Profile:   Kimberly Waters is a 77 y.o. female with a hx of syncope who is being seen 02/04/2023 for the evaluation of same  at the request of DrGA.  History of Present Illness:   Ms. [name] admitted with 4 episodes of syncope, having been seen in the emergency room a week or 2 ago for syncope.  Her ECG was not seemingly appreciated to have interval significant prolongation of her QT interval.  While in the emergency room on this occasion she had recurrent syncope with polymorphic ventricular tachycardia in a pattern consistent with torsade de pointes.  Has had recurrent polymorphic ventricular tachycardia in the same pattern.  Initially her potassium was low, with recurrent polymorphic ventricular tachycardia and the potassium and magnesium have been normal.  Underwent catheterization demonstrating normal coronary arteries.  Echocardiogram 1/24 demonstrated normal LV function.  TSH was noted to be 66.  She has been on chronic Synthroid replacement.  Followed closely by Dr. Inda Merlin.  Takes her medications regularly.  Last TSH is not yet known.  Only other medication with recurrent polymorphic ventricular tachycardia was Lexapro which she has also been on for many years.  Her inhalers are associated with torsade but only in the context apparently of congenital long QT syndrome   Past Medical History:  Diagnosis Date   Allergy    Breast cancer ([place])    Cataract    Colon polyps    Depression    Dyspnea    SEASONAL   Family history of breast cancer    Family history of colon cancer    GERD (gastroesophageal reflux disease)    History of radiation therapy 06/05/17-07/20/17   right chest wall 50.4 Gy in 28 fractions, right axillary nodal region  45 Gy in 25 fractions   Hyperlipidemia    Hypertension    Hypertensive retinopathy    Hypothyroidism    Macular degeneration    Peripheral neuropathy 06/18/2019   PONV (postoperative nausea and vomiting)     Past Surgical History:  Procedure Laterality Date   ABDOMINAL HYSTERECTOMY     KNEE SURGERY Right    MASTECTOMY W/ SENTINEL NODE BIOPSY Right 03/05/2017   Procedure: BILATERAL TOTAL MASTECTOMIES WITH RIGHT SENTINEL LYMPH NODE BIOPSIES;  Surgeon: Excell Seltzer, MD;  Location: [facility];  Service: General;  Laterality: Right;   SHOULDER SURGERY     BILAT       Inpatient Medications: Scheduled Meds:  arformoterol  15 mcg Nebulization BID   aspirin EC  81 mg Oral Daily   buPROPion  150 mg Oral Daily   Chlorhexidine Gluconate Cloth  6 each Topical Daily   escitalopram  10 mg Oral Daily   ezetimibe  10 mg Oral Daily   furosemide  40 mg Oral Daily   heparin  5,000 Units Subcutaneous Q8H   hydrOXYzine  25 mg Oral QHS   levothyroxine  200 mcg Oral Q0600   liothyronine  25 mcg Oral BID   melatonin  5 mg Oral QHS   pantoprazole  40 mg Oral Daily   potassium chloride  40 mEq Oral BID   rosuvastatin  20 mg Oral Daily   sodium chloride flush  3 mL Intravenous Q12H  sodium chloride flush  3 mL Intravenous Q12H   Continuous Infusions:  sodium chloride     isoproterenol (ISUPREL) 1 mg in dextrose 5 % 250 mL (0.004 mg/mL) infusion 6 mcg/min (02/04/23 1000)   lidocaine 1 mg/min (02/04/23 1000)   PRN Meds: sodium chloride, acetaminophen, albuterol, diazepam, lip balm, sodium chloride flush  Allergies:    Allergies  Allergen Reactions   Latex Other (See Comments)    Other reaction(s): Other (See Comments) Tears skin.   Adhesive [Tape] Other (See Comments)    (skin tears)  Tolerates PAPER TAPE   Codeine Nausea Only   Other Nausea Only    Anesthesia--nausea     Social History:   Social History   Socioeconomic History   Marital status: Married    Spouse name: Not on  file   Number of children: 2   Years of education: Not on file   Highest education level: Not on file  Occupational History   Occupation: reitred [employer]  Tobacco Use   Smoking status: Former    Packs/day: 1.00    Years: 45.00    Total pack years: 45.00    Types: Cigarettes    Quit date: 02/22/2017    Years since quitting: 5.9   Smokeless tobacco: Never  Vaping Use   Vaping Use: Never used  Substance and Sexual Activity   Alcohol use: No    Alcohol/week: 0.0 standard drinks of alcohol   Drug use: No   Sexual activity: Not Currently  Other Topics Concern   Not on file  Social History Narrative   Not on file   Social Determinants of Health   Financial Resource Strain: Not on file  Food Insecurity: Unknown (02/01/2023)   Hunger Vital Sign    Worried About Running Out of Food in the Last Year: Patient refused    Ran Out of Food in the Last Year: Patient refused  Transportation Needs: Unknown (02/01/2023)   PRAPARE - Hydrologist (Medical): Patient refused    Lack of Transportation (Non-Medical): Patient refused  Physical Activity: Not on file  Stress: Not on file  Social Connections: Not on file  Intimate Partner Violence: Unknown (02/01/2023)   Humiliation, Afraid, Rape, and Kick questionnaire    Fear of Current or Ex-Partner: Patient refused    Emotionally Abused: Patient refused    Physically Abused: Patient refused    Sexually Abused: Patient refused    Family History:     Family History  Problem Relation Age of Onset   Heart disease Mother 28       stents   Colon cancer Maternal Grandfather 59   Colon cancer Maternal Aunt 65   Colon cancer Maternal Uncle        dx in his 31s   Breast cancer Paternal Aunt 1   Breast cancer Cousin 77       paternal first cousin   Lung cancer Paternal Grandfather    Colon cancer Maternal Uncle    Head & neck cancer Maternal Uncle        oral cancer     ROS:  Please see the history  of present illness.    All other ROS reviewed and negative.     Physical Exam/Data:   Vitals:   02/04/23 0700 02/04/23 0722 02/04/23 0800 02/04/23 0900  BP: (!) 144/71  (!) 137/55 (!) 170/74  Pulse: 67  64 68  Resp: 14  17 20  $ Temp:  98.1  F (36.7 C)    TempSrc:  Oral    SpO2: 100%  99% 99%  Weight:      Height:        Intake/Output Summary (Last 24 hours) at 02/04/2023 1059 Last data filed at 02/04/2023 1000 Gross per 24 hour  Intake 2187.6 ml  Output 2960 ml  Net -772.4 ml      02/02/2023    2:42 AM 02/01/2023    8:11 PM 02/01/2023    1:27 PM  Last 3 Weights  Weight (lbs) 195 lb 15.8 oz 191 lb 9.3 oz 188 lb 0.8 oz  Weight (kg) 88.9 kg 86.9 kg 85.3 kg     Body mass index is 38.28 kg/m.  General:  Well nourished, well developed, in no acute distress  HEENT: normal Neck: no JVD Vascular: No carotid bruits; Distal pulses 2+ bilaterally Cardiac:  normal S1, S2; RRR; no murmur   Lungs:  clear to auscultation bilaterally, no wheezing, rhonchi or rales  Abd: soft, nontender, no hepatomegaly  Ext: no edema Musculoskeletal:  No deformities, BUE and BLE strength normal and equal Skin: warm and dry  Neuro:  CNs 2-12 intact, no focal abnormalities noted Psych:  Normal affect   EKG:  The EKG was personally reviewed and demonstrates:    Telemetry:  Telemetry was personally reviewed and demonstrates: No further ventricular tachycardia.  Off of telemetry, her QTc interval is about 520 ms  Relevant CV Studies:     Thyroid  Recent Labs  Lab 02/01/23 1700 02/01/23 1800  TSH 66.430*  --   FREET4  --  <0.25*    BNP Recent Labs  Lab 02/01/23 1553 02/01/23 1755  BNP 45.9 44.8    DDimer No results for input(s): "DDIMER" in the last 168 hours.   Radiology/Studies:    Assessment and Plan:    As described in the note from yesterday, with presumptive trigger progressive profound hypothyroidism.  We will add prednisone until tomorrow at which point we will have a chance to  see what her last TSH was.  In the event that is abruptly changed over the last months, then we could presume that there is a an acute inflammatory destruction of the thyroid and could be mitigated by anti-inflammatory drugs.  If her last TSH is have been in line with the current perhaps that is not the mechanism.  There is no further polymorphic VT overnight.  Will continue her on the isoproterenol and the lidocaine and the thyroid replacement overnight and begin to wean tomorrow   Signed, Virl Axe, MD  02/04/2023 10:59 AM

## 2023-02-04 NOTE — Progress Notes (Signed)
TRH was consulted on 02/03/2023 for hypothyroidism.  Currently levothyroxine is being continued and liothyronine is also being used for 48 hours.  Patient needs outpatient follow-up with PCP or endocrinology with repeat TSH in 4 to 6 weeks.  Rest of the management as per the primary cardiology team.  TRH to remain available as needed.

## 2023-02-04 NOTE — Progress Notes (Signed)
Rounding Note    Patient Name: Kimberly Waters Date of Encounter: 02/04/2023  [facility] Cardiologist: Candee Furbish, MD   Subjective   Torsades yesterday afternoon. None since then on isuprel and lidocaine.  Inpatient Medications    Scheduled Meds:  arformoterol  15 mcg Nebulization BID   aspirin EC  81 mg Oral Daily   buPROPion  150 mg Oral Daily   Chlorhexidine Gluconate Cloth  6 each Topical Daily   escitalopram  10 mg Oral Daily   ezetimibe  10 mg Oral Daily   furosemide  40 mg Oral Daily   heparin  5,000 Units Subcutaneous Q8H   hydrOXYzine  25 mg Oral QHS   levothyroxine  200 mcg Oral Q0600   liothyronine  25 mcg Oral BID   melatonin  5 mg Oral QHS   pantoprazole  40 mg Oral Daily   potassium chloride  40 mEq Oral BID   rosuvastatin  20 mg Oral Daily   sodium chloride flush  3 mL Intravenous Q12H   sodium chloride flush  3 mL Intravenous Q12H   Continuous Infusions:  sodium chloride     isoproterenol (ISUPREL) 1 mg in dextrose 5 % 250 mL (0.004 mg/mL) infusion 6 mcg/min (02/04/23 0814)   lidocaine 1 mg/min (02/04/23 0814)   PRN Meds:    Vital Signs    Vitals:   02/04/23 0311 02/04/23 0700 02/04/23 0722 02/04/23 0800  BP:  (!) 144/71  (!) 137/55  Pulse:  67  64  Resp:  14  17  Temp: 97.7 F (36.5 C)  98.1 F (36.7 C)   TempSrc: Oral  Oral   SpO2:  100%  99%  Weight:      Height:        Intake/Output Summary (Last 24 hours) at 02/04/2023 0924 Last data filed at 02/04/2023 0814 Gross per 24 hour  Intake 1762.64 ml  Output 2960 ml  Net -1197.36 ml      02/02/2023    2:42 AM 02/01/2023    8:11 PM 02/01/2023    1:27 PM  Last 3 Weights  Weight (lbs) 195 lb 15.8 oz 191 lb 9.3 oz 188 lb 0.8 oz  Weight (kg) 88.9 kg 86.9 kg 85.3 kg      Telemetry    Sinus brady - Personally Reviewed  ECG    No new- Personally Reviewed  Physical Exam   GEN: No acute distress.   Neck: No JVD Cardiac: RRR, no murmurs, rubs, or gallops.  Respiratory:  Clear to auscultation bilaterally. GI: Soft, nontender, non-distended  MS: No edema; No deformity. Neuro:  Nonfocal  Psych: Normal affect   Labs    High Sensitivity Troponin:   Recent Labs  Lab 01/23/23 1808 01/23/23 1952 02/01/23 1553 02/01/23 1800  TROPONINIHS 15 16 21* 19*     Chemistry Recent Labs  Lab 02/01/23 1800 02/02/23 0017 02/03/23 0200 02/03/23 1458 02/04/23 0711  NA  --    < > 136 136 138  K  --    < > 3.9 3.9 4.8  CL  --    < > 101 98 104  CO2  --    < > 23 24 20*  GLUCOSE  --    < > 128* 123* 160*  BUN  --    < > 15 15 13  $ CREATININE 1.62*   < > 1.48* 1.47* 1.56*  CALCIUM  --    < > 7.8* 8.8* 9.1  MG 2.0  --   --  2.1  --   GFRNONAA 33*   < > 37* 37* 34*  ANIONGAP  --    < > 12 14 14   $ < > = values in this interval not displayed.    Lipids No results for input(s): "CHOL", "TRIG", "HDL", "LABVLDL", "LDLCALC", "CHOLHDL" in the last 168 hours.  Hematology Recent Labs  Lab 02/02/23 0017 02/03/23 0206 02/04/23 0711  WBC 10.9* 10.7* 11.0*  RBC 4.43 3.98 3.98  HGB 12.8 11.5* 11.4*  HCT 39.0 36.8 35.5*  MCV 88.0 92.5 89.2  MCH 28.9 28.9 28.6  MCHC 32.8 31.3 32.1  RDW 14.6 15.2 15.3  PLT 317 255 238   Thyroid  Recent Labs  Lab 02/01/23 1700 02/01/23 1800  TSH 66.430*  --   FREET4  --  <0.25*    BNP Recent Labs  Lab 02/01/23 1553 02/01/23 1755  BNP 45.9 44.8    DDimer No results for input(s): "DDIMER" in the last 168 hours.   Radiology    CARDIAC CATHETERIZATION  Result Date: 02/02/2023 Normal coronary arteries with a dominant RCA. LVEDP 22 mmHg. RECOMMENDATION: Continue medical therapy and EP evaluation for her VT in the setting of hypokalemia and continuation of statin therapy for hyperlipidemia.    Cardiac Studies   01/24/23 echo 1. Left ventricular ejection fraction, by estimation, is 50 to 55%. Left  ventricular ejection fraction by PLAX is 59 %. The left ventricle has low  normal function. The left ventricle has no regional  wall motion  abnormalities. There is mild concentric  left ventricular hypertrophy. Left ventricular diastolic parameters are  consistent with Grade I diastolic dysfunction (impaired relaxation).   2. Right ventricular systolic function is normal. The right ventricular  size is normal.   3. The mitral valve is grossly normal. Trivial mitral valve  regurgitation. No evidence of mitral stenosis.   4. The aortic valve is tricuspid. Aortic valve regurgitation is not  visualized. No aortic stenosis is present.   5. The inferior vena cava is normal in size with greater than 50%  respiratory variability, suggesting right atrial pressure of 3 mmHg.  Patient Profile     77 y.o. female with a hx of CAD, LBBB, COPD, breast cancer s/p mastectomy, GERD, depression,chronic diastolic heart failure,  who is being seen 02/01/2023 for the evaluation of syncope and VT at the request of Dr Pearline Cables.  Assessment & Plan    Principal Problem:   Ventricular tachycardia (HCC) Active Problems:   Torsades de pointes ([place])  Pt had recurrent torsade yesterday afternoon. Repeat BMET and mag normal . Reviewed telemetry with Dr. Caryl Comes, EP consult performed. Will plan on the following - will supplement thyroid with levothyroxine 200 mcg daily, for one week.  - liothyronine 25 mcg Bid for 48 hours.  - isoproterenol titrated to HR >80.  - lidocaine IV - may want to d/c this today.  - IV magnesium given. - amiodarone discontinued.  Goal is to shorten Qt interval with torsade de pointes.   Elouise Munroe, MD 9:24 AM  CRITICAL CARE Performed by: Cherlynn Kaiser, MD   Total critical care time: 35 minutes   Critical care time was exclusive of separately billable procedures and treating other patients.   Critical care was necessary to treat or prevent imminent or life-threatening deterioration.   Critical care was time spent personally by me (independent of APPs or residents) on the following activities:  development of treatment plan with patient and/or surrogate as well as nursing, discussions with  consultants, evaluation of patient's response to treatment, examination of patient, obtaining history from patient or surrogate, ordering and performing treatments and interventions, ordering and review of laboratory studies, ordering and review of radiographic studies, pulse oximetry and re-evaluation of patient's condition.

## 2023-02-05 ENCOUNTER — Ambulatory Visit: Payer: Medicare Other | Admitting: Nurse Practitioner

## 2023-02-05 ENCOUNTER — Encounter (HOSPITAL_COMMUNITY): Payer: Self-pay | Admitting: Cardiovascular Disease

## 2023-02-05 DIAGNOSIS — I472 Ventricular tachycardia, unspecified: Secondary | ICD-10-CM | POA: Diagnosis not present

## 2023-02-05 LAB — GLUCOSE, CAPILLARY
Glucose-Capillary: 119 mg/dL — ABNORMAL HIGH (ref 70–99)
Glucose-Capillary: 150 mg/dL — ABNORMAL HIGH (ref 70–99)
Glucose-Capillary: 205 mg/dL — ABNORMAL HIGH (ref 70–99)

## 2023-02-05 LAB — CBC
HCT: 35.7 % — ABNORMAL LOW (ref 36.0–46.0)
Hemoglobin: 11.2 g/dL — ABNORMAL LOW (ref 12.0–15.0)
MCH: 28.4 pg (ref 26.0–34.0)
MCHC: 31.4 g/dL (ref 30.0–36.0)
MCV: 90.4 fL (ref 80.0–100.0)
Platelets: 213 10*3/uL (ref 150–400)
RBC: 3.95 MIL/uL (ref 3.87–5.11)
RDW: 15.1 % (ref 11.5–15.5)
WBC: 9 10*3/uL (ref 4.0–10.5)
nRBC: 0 % (ref 0.0–0.2)

## 2023-02-05 LAB — BASIC METABOLIC PANEL
Anion gap: 12 (ref 5–15)
BUN: 11 mg/dL (ref 8–23)
CO2: 24 mmol/L (ref 22–32)
Calcium: 8.9 mg/dL (ref 8.9–10.3)
Chloride: 98 mmol/L (ref 98–111)
Creatinine, Ser: 1.61 mg/dL — ABNORMAL HIGH (ref 0.44–1.00)
GFR, Estimated: 33 mL/min — ABNORMAL LOW (ref >60)
Glucose, Bld: 225 mg/dL — ABNORMAL HIGH (ref 70–99)
Potassium: 4.4 mmol/L (ref 3.5–5.1)
Sodium: 134 mmol/L — ABNORMAL LOW (ref 135–145)

## 2023-02-05 LAB — PHOSPHORUS: Phosphorus: 2.9 mg/dL (ref 2.5–4.6)

## 2023-02-05 LAB — MAGNESIUM: Magnesium: 1.9 mg/dL (ref 1.7–2.4)

## 2023-02-05 NOTE — Progress Notes (Signed)
Rhythm has remained quiescent on lower dose of Isuprel.  Will stop. Continue IV lidocaine overnight.  Legrand Como [address] [place], [place]

## 2023-02-05 NOTE — Progress Notes (Addendum)
Electrophysiology Rounding Note  Patient Name: Kimberly Waters Date of Encounter: 02/05/2023  Primary Cardiologist: Candee Furbish, MD Electrophysiologist: None   Subjective   The patient is doing well today.  At this time, the patient denies chest pain, shortness of breath, or any new concerns.  Inpatient Medications    Scheduled Meds:  arformoterol  15 mcg Nebulization BID   aspirin EC  81 mg Oral Daily   buPROPion  150 mg Oral Daily   Chlorhexidine Gluconate Cloth  6 each Topical Daily   escitalopram  10 mg Oral Daily   ezetimibe  10 mg Oral Daily   furosemide  40 mg Oral Daily   heparin  5,000 Units Subcutaneous Q8H   hydrOXYzine  25 mg Oral QHS   levothyroxine  200 mcg Oral Q0600   liothyronine  25 mcg Oral BID   melatonin  5 mg Oral QHS   pantoprazole  40 mg Oral Daily   potassium chloride  40 mEq Oral BID   predniSONE  40 mg Oral BID WC   rosuvastatin  20 mg Oral Daily   senna-docusate  2 tablet Oral BID   sodium chloride flush  3 mL Intravenous Q12H   sodium chloride flush  3 mL Intravenous Q12H   Continuous Infusions:  sodium chloride     isoproterenol (ISUPREL) 1 mg in dextrose 5 % 250 mL (0.004 mg/mL) infusion 6 mcg/min (02/05/23 0722)   lidocaine 0.5 mg/min (02/05/23 0500)   PRN Meds: sodium chloride, acetaminophen, albuterol, diazepam, lip balm, sodium chloride flush, trimethobenzamide   Vital Signs    Vitals:   02/05/23 0500 02/05/23 0600 02/05/23 0700 02/05/23 0745  BP: 131/75 131/60 (!) 124/57   Pulse: 71 72 72   Resp: (!) 21 19 (!) 21   Temp:    98.3 F (36.8 C)  TempSrc:    Oral  SpO2: 97% 95% 95%   Weight:      Height:        Intake/Output Summary (Last 24 hours) at 02/05/2023 0749 Last data filed at 02/05/2023 0500 Gross per 24 hour  Intake 3101.89 ml  Output 3250 ml  Net -148.11 ml   Filed Weights   02/01/23 1327 02/01/23 2011 02/02/23 0242  Weight: 85.3 kg 86.9 kg 88.9 kg    Physical Exam    GEN- The patient is well  appearing, alert and oriented x 3 today.   HEENT- No gross abnormality.  Lungs- Clear to ausculation bilaterally, normal work of breathing Heart- Regular rate and rhythm, no murmurs, rubs or gallops GI- soft, NT, ND, + BS Extremities- no clubbing or cyanosis. No edema Neuro- No obvious focal abnormality.   Telemetry    NSR 70s (personally reviewed)   Patient Profile     Kimberly Waters is a 77 y.o. female with a hx of syncope who is being seen 02/04/2023 for the evaluation of same  at the request of DrGA.   Pt initially admitted for 4 episodes of syncope, having also been seen in the emergency room a week or 2 ago for syncope. ECG was not seemingly appreciate to have interval significant QT prolongation. This admission in the ER noted to have recurrent syncope with PMVT in a pattern consistent with TdP.    It is long short coupled, QT intervals range between 550 is 650 ms and the coupling interval of the PVC initiating the polymorphic ventricular tachycardia is over 500 ms which is relatively very specific for torsade de pointes as  a mechanism.   Assessment & Plan    Polymorphic VT QT prolongation Normal EF by echo 01/24/23 LHC 02/02/23 with normal cors and a dominant RCA Potassium4.4 (02/12 0053) Magnesium  1.9 (02/12 0053) Creatinine, ser  1.61* (02/12 0053) Keep K > 4.0 and Mg > 2.0 As per Dr. Aquilla Hacker previous note, suspect profound hypothyroidism as a potential etiology of her QT prolongation -> PMVT Will discuss timing of wean of isuprel  Pt admits to taking copious amounts of sleep aids prior to admission in January.  She states she could not read the instructions and was taking 1 benadryl in the am and up to 6 at night AS WELL AS 4-5 tylenol PM.  EKG today shows QT ~ 450-460  2. Hypothyroidism TSH this admission 66, has been on suppression for years. TSH normal in 03/2022. Discussed personally with RN at [facility] at [place]. TSH value at that time was 1.07, without associated T3  or T4 drawn, and no checks since.   3. H/o LBBB QRS 144 ms at baseline (02/2022)  For questions or updates, please contact [place] Please consult [email or web address] for contact info under Cardiology/STEMI.  Signed, Shirley Friar, PA-C  02/05/2023, 7:49 AM

## 2023-02-06 ENCOUNTER — Encounter (INDEPENDENT_AMBULATORY_CARE_PROVIDER_SITE_OTHER): Payer: Medicare Other | Admitting: Ophthalmology

## 2023-02-06 ENCOUNTER — Other Ambulatory Visit: Payer: Self-pay

## 2023-02-06 DIAGNOSIS — I10 Essential (primary) hypertension: Secondary | ICD-10-CM

## 2023-02-06 DIAGNOSIS — H353211 Exudative age-related macular degeneration, right eye, with active choroidal neovascularization: Secondary | ICD-10-CM

## 2023-02-06 DIAGNOSIS — H353221 Exudative age-related macular degeneration, left eye, with active choroidal neovascularization: Secondary | ICD-10-CM

## 2023-02-06 DIAGNOSIS — H25813 Combined forms of age-related cataract, bilateral: Secondary | ICD-10-CM

## 2023-02-06 DIAGNOSIS — H35033 Hypertensive retinopathy, bilateral: Secondary | ICD-10-CM

## 2023-02-06 DIAGNOSIS — I472 Ventricular tachycardia, unspecified: Secondary | ICD-10-CM | POA: Diagnosis not present

## 2023-02-06 LAB — CBC
HCT: 35.9 % — ABNORMAL LOW (ref 36.0–46.0)
Hemoglobin: 11.9 g/dL — ABNORMAL LOW (ref 12.0–15.0)
MCH: 29.5 pg (ref 26.0–34.0)
MCHC: 33.1 g/dL (ref 30.0–36.0)
MCV: 89.1 fL (ref 80.0–100.0)
Platelets: 281 10*3/uL (ref 150–400)
RBC: 4.03 MIL/uL (ref 3.87–5.11)
RDW: 15.3 % (ref 11.5–15.5)
WBC: 11.1 10*3/uL — ABNORMAL HIGH (ref 4.0–10.5)
nRBC: 0 % (ref 0.0–0.2)

## 2023-02-06 LAB — BASIC METABOLIC PANEL
Anion gap: 9 (ref 5–15)
BUN: 17 mg/dL (ref 8–23)
CO2: 27 mmol/L (ref 22–32)
Calcium: 9.4 mg/dL (ref 8.9–10.3)
Chloride: 98 mmol/L (ref 98–111)
Creatinine, Ser: 1.45 mg/dL — ABNORMAL HIGH (ref 0.44–1.00)
GFR, Estimated: 38 mL/min — ABNORMAL LOW (ref >60)
Glucose, Bld: 185 mg/dL — ABNORMAL HIGH (ref 70–99)
Potassium: 4.9 mmol/L (ref 3.5–5.1)
Sodium: 134 mmol/L — ABNORMAL LOW (ref 135–145)

## 2023-02-06 LAB — LIPOPROTEIN A (LPA): Lipoprotein (a): 47.4 nmol/L — ABNORMAL HIGH (ref <75.0)

## 2023-02-06 LAB — MAGNESIUM: Magnesium: 2.1 mg/dL (ref 1.7–2.4)

## 2023-02-06 MED ORDER — AMLODIPINE BESYLATE 10 MG PO TABS
10.0000 mg | ORAL_TABLET | Freq: Every day | ORAL | Status: DC
  Administered 2023-02-07 – 2023-02-09 (×3): 10 mg via ORAL
  Filled 2023-02-06 (×3): qty 1

## 2023-02-06 MED ORDER — POTASSIUM CHLORIDE CRYS ER 20 MEQ PO TBCR: 20.0000 meq | EXTENDED_RELEASE_TABLET | Freq: Every day | ORAL | Status: DC

## 2023-02-06 MED ORDER — POTASSIUM CHLORIDE CRYS ER 20 MEQ PO TBCR
40.0000 meq | EXTENDED_RELEASE_TABLET | Freq: Every day | ORAL | Status: DC
  Administered 2023-02-06 – 2023-02-09 (×4): 40 meq via ORAL
  Filled 2023-02-06 (×4): qty 2

## 2023-02-06 MED ORDER — PREDNISONE 20 MG PO TABS
40.0000 mg | ORAL_TABLET | Freq: Every day | ORAL | Status: DC
  Administered 2023-02-06: 40 mg via ORAL
  Filled 2023-02-06 (×2): qty 2

## 2023-02-06 MED ORDER — AMLODIPINE BESYLATE 5 MG PO TABS
5.0000 mg | ORAL_TABLET | Freq: Every day | ORAL | Status: DC
  Administered 2023-02-06: 5 mg via ORAL
  Filled 2023-02-06: qty 1

## 2023-02-06 MED ORDER — AMLODIPINE BESYLATE 5 MG PO TABS
5.0000 mg | ORAL_TABLET | Freq: Once | ORAL | Status: AC
  Administered 2023-02-06: 5 mg via ORAL
  Filled 2023-02-06: qty 1

## 2023-02-06 NOTE — Progress Notes (Signed)
RN called, reports patient had chest pain for 5 minutes and resolved spontaneously this PM, no recurrent VT or SVT so far. EKG was done at 1900, no acute ischemic changes noted. Her VS is stable but BP has been elevated up to 179/78 and now 160/70. Amlodipine 23m was started today, will increase to 168mdaily, 7m63m1 now. Suspect HTN related chest pain versus anxiety. Cath from 2/9 clean coronary.

## 2023-02-06 NOTE — Progress Notes (Addendum)
Electrophysiology Rounding Note  Patient Name: Kimberly Waters Date of Encounter: 02/06/2023  Primary Cardiologist: Candee Furbish, MD Electrophysiologist: None   Subjective   The patient is doing well today.  At this time, the patient denies chest pain, shortness of breath, or any new concerns.  Inpatient Medications    Scheduled Meds:  arformoterol  15 mcg Nebulization BID   aspirin EC  81 mg Oral Daily   buPROPion  150 mg Oral Daily   Chlorhexidine Gluconate Cloth  6 each Topical Daily   escitalopram  10 mg Oral Daily   ezetimibe  10 mg Oral Daily   furosemide  40 mg Oral Daily   heparin  5,000 Units Subcutaneous Q8H   hydrOXYzine  25 mg Oral QHS   levothyroxine  200 mcg Oral Q0600   melatonin  5 mg Oral QHS   pantoprazole  40 mg Oral Daily   potassium chloride  40 mEq Oral BID   rosuvastatin  20 mg Oral Daily   senna-docusate  2 tablet Oral BID   sodium chloride flush  3 mL Intravenous Q12H   sodium chloride flush  3 mL Intravenous Q12H   Continuous Infusions:  sodium chloride     lidocaine 0.5 mg/min (02/06/23 0600)   PRN Meds: sodium chloride, acetaminophen, albuterol, diazepam, lip balm, sodium chloride flush, trimethobenzamide   Vital Signs    Vitals:   02/06/23 0515 02/06/23 0530 02/06/23 0545 02/06/23 0600  BP:    (!) 164/64  Pulse: 60 61 60 60  Resp: 16 15  15  $ Temp:      TempSrc:      SpO2: 92% 96% 95% 95%  Weight:      Height:        Intake/Output Summary (Last 24 hours) at 02/06/2023 0711 Last data filed at 02/06/2023 0600 Gross per 24 hour  Intake 834.91 ml  Output 4150 ml  Net -3315.09 ml   Filed Weights   02/01/23 1327 02/01/23 2011 02/02/23 0242  Weight: 85.3 kg 86.9 kg 88.9 kg    Physical Exam    GEN- The patient is well appearing, alert and oriented x 3 today.   HEENT- No gross abnormality.  Lungs- Clear to ausculation bilaterally, normal work of breathing Heart- Regular rate and rhythm, no murmurs, rubs or gallops GI- soft,  NT, ND, + BS Extremities- no clubbing or cyanosis. No edema Neuro- No obvious focal abnormality.   Telemetry    NSR 60s, brief run of SVT last night around 140 bpm (personally reviewed)   Patient Profile     Kimberly Waters is a 77 y.o. female with a hx of syncope who is being seen 02/04/2023 for the evaluation of same  at the request of DrGA.    Pt initially admitted for 4 episodes of syncope, having also been seen in the emergency room a week or 2 ago for syncope. ECG was not seemingly appreciate to have interval significant QT prolongation. This admission in the ER noted to have recurrent syncope with PMVT in a pattern consistent with TdP.     It is long short coupled, QT intervals range between 550 is 650 ms and the coupling interval of the PVC initiating the polymorphic ventricular tachycardia is over 500 ms which is relatively very specific for torsade de pointes as a mechanism.   Assessment & Plan    Polymorphic VT QT prolongation Normal EF by echo 01/24/23 LHC 02/02/23 with normal cors and a dominant RCA Potassium4.9 (02/13  0140) Magnesium  1.9 (02/12 0053) Creatinine, ser  1.45* (02/13 0140) Keep K > 4.0 and Mg > 2.0  As per Dr. Aquilla Hacker previous note, suspect profound hypothyroidism as a potential etiology of her QT prolongation -> PMVT Had run of tachycardia around 140bpm around 1850 last night, appears brief SVT EKG this am with QT ~450-460   2. Hypothyroidism TSH this admission 68, has been on suppression for years. TSH normal in 03/2022. Discussed personally with RN at [place] at [employer] [facility]. TSH value at that time was 1.07, without associated T3 or T4 drawn, and no checks since.  Will send antibodies   3. H/o LBBB QRS 144 ms at baseline (02/2022)  4. HTN Start amlodipine Avoiding BB for now with h/o bradycardia  Stop lidocaine. Transfer to floor.   For questions or updates, please contact [place] Please consult [email or web address] for contact info under  Cardiology/STEMI.  Signed, Shirley Friar, PA-C  02/06/2023, 7:11 AM    (As above) Brief literature search fails to highlight causes for abrupt hypothyroidism in the setting of chronic hypothyroidism apart from iodine loading and 1 paper on immunotherapy  Her heart rate is better, QT intervals are much shorter, and our working hypothesis is the best that we have.  We will plan to recheck the TSH in about 3 weeks and continue her on 200 mcg a day until that time.  Other medication adjustments as outlined above  Antibodies we would expect to be unrevealing.   Will discontinue steroids as adrenal insufficiency, there presumed role, is not the issue   Table 5. Common Reasons for Abnormal TSH Levels on a Previously Stable Dosage of Thyroid Hormone  Patient nonadherent to thyroid hormone regimen (missing doses) Decreased absorption of thyroid hormone Patient is now taking thyroid hormone with food  Patient takes thyroid hormone within four hours of calcium, iron, soy products, or aluminum-containing antacids  Patient is prescribed medication that decreases absorption of thyroid hormone, such as cholestyramine (Questran), colestipol (Colestid), orlistat (Xenical), or sucralfate (Carafate)  Patient is now pregnant or recently started or stopped estrogen-containing oral contraceptive or hormone therapy  Generic substitution for brand name or vice versa, or substitution of one generic formulation for another25  Patient started on sertraline (Zoloft), another selective serotonin reuptake inhibitor, or a tricyclic [id number]  Patient started on carbamazepine (Tegretol) or phenytoin (Dilantin) NOTE: Reasons are sorted by the clinically most important cause. TSH = thyroid-stimulating hormone. Information from references 25 and 31.  The above underlined need to be addressed

## 2023-02-06 NOTE — TOC Initial Note (Signed)
Transition of Care [facility]) - Initial/Assessment Note    Patient Details  Name: Kimberly Waters MRN: [id number] Date of Birth: August 17, 1946  Transition of Care [facility]) CM/SW Contact:    Tom-Johnson, Renea Ee, RN Phone Number: 02/06/2023, 3:34 PM  Clinical Narrative:                  CM spoke with patient at bedside about needs for post hospital transition. Has Hx of double Mastectomy.  Admitted for Ventricular Tachycardia, Cardiology following. Currently on room air. No drips From home with husband. Has two supportive children. Independent with care and drives daytime prior to admission. Has all necessary DME's at home.  PCP is Charlane Ferretti, MD [facility] [address]  No TOC needs or recommendations noted. CM will continue to follow as patient progresses with care towards discharge.          Expected Discharge Plan: Home/Self Care Barriers to Discharge: Continued Medical Work up   Patient Goals and CMS Choice Patient states their goals for this hospitalization and ongoing recovery are:: To return home CMS Medicare.gov Compare Post Acute Care list provided to:: Patient Choice offered to / list presented to : NA      Expected Discharge Plan and Services   Discharge Planning Services: CM Consult Post Acute Care Choice: NA Living arrangements for the past 2 months: Single Family Home                 DME Arranged: N/A DME Agency: NA       HH Arranged: NA [place] Agency: NA        Prior Living Arrangements/Services Living arrangements for the past 2 months: Single Family Home Lives with:: Spouse Patient language and need for interpreter reviewed:: Yes Do you feel safe going back to the place where you live?: Yes      Need for Family Participation in Patient Care: Yes (Comment) Care giver support system in place?: Yes (comment) Current home services: DME (All necessary DME's) Criminal Activity/Legal Involvement Pertinent to Current  Situation/Hospitalization: No - Comment as needed  Activities of Daily Living Home Assistive Devices/Equipment: Walker (specify type), Grab bars in shower, Built-in shower seat, Bedside commode/3-in-1, Scales, Long-handled sponge ADL Screening (condition at time of admission) Patient's cognitive ability adequate to safely complete daily activities?: Yes Is the patient deaf or have difficulty hearing?: Yes (no aids) Does the patient have difficulty seeing, even when wearing glasses/contacts?: Yes (macular degeneration) Patient able to express need for assistance with ADLs?: Yes Does the patient have difficulty dressing or bathing?: Yes Independently performs ADLs?: Yes (appropriate for developmental age) Does the patient have difficulty walking or climbing stairs?: Yes Weakness of Legs: Both Weakness of Arms/Hands: None  Permission Sought/Granted Permission sought to share information with : Case Manager, Family Supports Permission granted to share information with : Yes, Verbal Permission Granted              Emotional Assessment Appearance:: Appears stated age Attitude/Demeanor/Rapport: Engaged, Gracious Affect (typically observed): Accepting, Appropriate, Calm, Hopeful, Pleasant Orientation: : Oriented to Self, Oriented to Place, Oriented to  Time, Oriented to Situation Alcohol / Substance Use: Not Applicable Psych Involvement: No (comment)  Admission diagnosis:  Ventricular tachycardia ([place]) [I47.20] V-tach [facility]) [I47.20] Patient Active Problem List   Diagnosis Date Noted   Torsades de pointes ([place]) 02/03/2023   Ventricular tachycardia ([place]) 02/01/2023   Transient loss of consciousness 01/23/2023   Stage 3b chronic kidney disease (CKD) ([place]) 01/23/2023  Coronary artery disease involving native coronary artery of native heart without angina pectoris 03/08/2022   Left bundle branch block 03/08/2022   Physical deconditioning 06/09/2021   Aortic atherosclerosis ([place])  05/27/2020   COPD (chronic obstructive pulmonary disease) ([place]) 05/27/2020   Pre-diabetes [id number]   Metabolic syndrome [id number]   Neuropathy 07/25/2019   MGUS (monoclonal gammopathy of unknown significance) 06/26/2019   Peripheral neuropathy 06/18/2019   Chronic diastolic CHF (congestive heart failure) ([place]) 05/28/2018   Acute exacerbation of CHF (congestive heart failure) ([place]) 04/01/2018   Dyspnea on exertion 03/28/2018   Genetic testing 03/14/2017   Colon polyps    Family history of colon cancer    Family history of breast cancer    Malignant neoplasm of upper-outer quadrant of right breast in female, estrogen receptor positive ([place]) 02/06/2017   Hypotension 12/31/2012   Asthma exacerbation 12/30/2012   Dehydration 12/30/2012   Viral gastroenteritis 12/30/2012   Vitamin D deficiency 04/16/2012   Asthma 04/24/2007   HYPOTHYROIDISM, UNSPECIFIED 02/21/2007   HYPERTRIGLYCERIDEMIA 02/21/2007   Hyperlipidemia 02/21/2007   DEPRESSION, MAJOR, RECURRENT 02/21/2007   TOBACCO DEPENDENCE 02/21/2007   HYPERTENSION, BENIGN SYSTEMIC 02/21/2007   SINUSITIS, CHRONIC, NOS 02/21/2007   RHINITIS, ALLERGIC 02/21/2007   DIVERTICULOSIS OF COLON 02/21/2007   HEARTBURN 02/21/2007   INCONTINENCE, URGE 02/21/2007   PCP:  Charlane Ferretti, MD [id number] [place] [address] [address] [address]Phone: [phone]: [phone] [place] [id number]: [phone]: [phone]    Social Determinants of Health (SDOH) Social History: SDOH Screenings   Food Insecurity: Unknown (02/01/2023)  Transportation Needs: Unknown (02/01/2023)  Utilities: Unknown (02/01/2023)  Tobacco Use: Medium Risk (02/05/2023)   SDOH Interventions: Transportation Interventions: Intervention Not Indicated, Inpatient TOC, Patient Resources (Friends/Family)   Readmission Risk  Interventions     No data to display

## 2023-02-07 DIAGNOSIS — I472 Ventricular tachycardia, unspecified: Secondary | ICD-10-CM | POA: Diagnosis not present

## 2023-02-07 LAB — GLUCOSE, CAPILLARY: Glucose-Capillary: 263 mg/dL — ABNORMAL HIGH (ref 70–99)

## 2023-02-07 LAB — BASIC METABOLIC PANEL
Anion gap: 11 (ref 5–15)
BUN: 22 mg/dL (ref 8–23)
CO2: 28 mmol/L (ref 22–32)
Calcium: 9.6 mg/dL (ref 8.9–10.3)
Chloride: 93 mmol/L — ABNORMAL LOW (ref 98–111)
Creatinine, Ser: 1.44 mg/dL — ABNORMAL HIGH (ref 0.44–1.00)
GFR, Estimated: 38 mL/min — ABNORMAL LOW (ref >60)
Glucose, Bld: 202 mg/dL — ABNORMAL HIGH (ref 70–99)
Potassium: 3.9 mmol/L (ref 3.5–5.1)
Sodium: 132 mmol/L — ABNORMAL LOW (ref 135–145)

## 2023-02-07 LAB — MAGNESIUM: Magnesium: 2.1 mg/dL (ref 1.7–2.4)

## 2023-02-07 LAB — CBC
HCT: 38.4 % (ref 36.0–46.0)
Hemoglobin: 12.8 g/dL (ref 12.0–15.0)
MCH: 29 pg (ref 26.0–34.0)
MCHC: 33.3 g/dL (ref 30.0–36.0)
MCV: 87.1 fL (ref 80.0–100.0)
Platelets: 308 10*3/uL (ref 150–400)
RBC: 4.41 MIL/uL (ref 3.87–5.11)
RDW: 15.1 % (ref 11.5–15.5)
WBC: 13.2 10*3/uL — ABNORMAL HIGH (ref 4.0–10.5)
nRBC: 0 % (ref 0.0–0.2)

## 2023-02-07 MED ORDER — SPIRONOLACTONE 25 MG PO TABS
25.0000 mg | ORAL_TABLET | Freq: Every day | ORAL | Status: DC
  Administered 2023-02-07 – 2023-02-09 (×3): 25 mg via ORAL
  Filled 2023-02-07 (×3): qty 1

## 2023-02-07 NOTE — Progress Notes (Addendum)
Electrophysiology Rounding Note  Patient Name: Kimberly Waters Date of Encounter: 02/07/2023  Primary Cardiologist: Candee Furbish, MD Electrophysiologist: None   Subjective   The patient is doing OK this am. Feeling "off" but no specific complaint other than a headache at the top of her head.    Had some chest pressure overnight and amlodipine was increased  Inpatient Medications    Scheduled Meds:  amLODipine  10 mg Oral Daily   arformoterol  15 mcg Nebulization BID   aspirin EC  81 mg Oral Daily   buPROPion  150 mg Oral Daily   Chlorhexidine Gluconate Cloth  6 each Topical Daily   escitalopram  10 mg Oral Daily   ezetimibe  10 mg Oral Daily   furosemide  40 mg Oral Daily   heparin  5,000 Units Subcutaneous Q8H   hydrOXYzine  25 mg Oral QHS   levothyroxine  200 mcg Oral Q0600   melatonin  5 mg Oral QHS   pantoprazole  40 mg Oral Daily   potassium chloride  40 mEq Oral Daily   rosuvastatin  20 mg Oral Daily   senna-docusate  2 tablet Oral BID   sodium chloride flush  3 mL Intravenous Q12H   sodium chloride flush  3 mL Intravenous Q12H   Continuous Infusions:  sodium chloride     PRN Meds: sodium chloride, acetaminophen, albuterol, diazepam, lip balm, sodium chloride flush, trimethobenzamide   Vital Signs    Vitals:   02/07/23 0300 02/07/23 0327 02/07/23 0400 02/07/23 0500  BP: (!) 149/78  (!) 158/85 (!) 171/85  Pulse: 64  64 61  Resp: 13  18 19  $ Temp:  98 F (36.7 C)    TempSrc:      SpO2: 95%  90% (!) 89%  Weight:      Height:        Intake/Output Summary (Last 24 hours) at 02/07/2023 0736 Last data filed at 02/07/2023 0600 Gross per 24 hour  Intake 266.25 ml  Output 1800 ml  Net -1533.75 ml   Filed Weights   02/01/23 1327 02/01/23 2011 02/02/23 0242  Weight: 85.3 kg 86.9 kg 88.9 kg    Physical Exam    GEN- The patient is well appearing, alert and oriented x 3 today.   HEENT- No gross abnormality.  Lungs- Clear to ausculation bilaterally, normal  work of breathing Heart- Regular rate and rhythm, no murmurs, rubs or gallops GI- soft, NT, ND, + BS Extremities- no clubbing or cyanosis. No edema Neuro- No obvious focal abnormality.   Telemetry    NSR 60s (personally reviewed)   Patient Profile     Kimberly Waters is a 77 y.o. female with a hx of syncope who is being seen 02/04/2023 for the evaluation of same  at the request of DrGA.    Pt initially admitted for 4 episodes of syncope, having also been seen in the emergency room a week or 2 ago for syncope. ECG was not seemingly appreciate to have interval significant QT prolongation. This admission in the ER noted to have recurrent syncope with PMVT in a pattern consistent with TdP.     It is long short coupled, QT intervals range between 550 is 650 ms and the coupling interval of the PVC initiating the polymorphic ventricular tachycardia is over 500 ms which is relatively very specific for torsade de pointes as a mechanism.   ECG today and yesterday still with QT I think about 520   thre is  about 30 msec of QRSd excess so QTC about 490  this is better but not normal  Assessment & Plan    Polymorphic VT QT prolongation Normal EF by echo 01/24/23 LHC 02/02/23 with normal cors and a dominant RCA Potassium3.9 (02/14 0033) Magnesium  2.1 (02/14 0033) Creatinine, ser  1.44* (02/14 0033) Keep K > 4.0 and Mg > 2.0  As per Dr. Aquilla Hacker previous note, suspect profound hypothyroidism as a potential etiology of her QT prolongation -> PMVT EKG this am with QT ~450-460   2. Hypothyroidism TSH this admission 66, has been on suppression for years. TSH normal in 03/2022. Discussed personally with RN at [place] at [place]. TSH value at that time was 1.07, without associated T3 or T4 drawn, and no checks since.  Thyroid antibodies processing Pt states that she had a 2-3 week viral illness in December with poor med tolerance due to symptoms. After that, she had significant sleep issues which led to her  overusing Benadryl and tylenol PM. During this time, she would take her Levothyroxine all different times of the day, with or without food, and with or without other medications, including antacids at times (her protonix as well as prn TUMs)   3. H/o LBBB QRS 144 ms at baseline (02/2022)   4. HTN Continue amlodipine 10 mg daily Avoiding BB for now with h/o bradycardia   For questions or updates, please contact [place] Please consult [email or web address] for contact info under Cardiology/STEMI.  Signed, Shirley Friar, PA-C  02/07/2023, 7:36 AM   Perhaps the hx of variable ingestion, vis a vis food,was enough.  I wonder  Will follow QT and now that QTc < 500 msec will hold off on anymote meds, though have thought of using Mexiletine for a few weeks to shorten QT HR is better about 60 and gradually picking up over last 24 hrs >>65 and now 70  Will add aldactone for BP and hopefully be able to use to replace Pot    Renal function improved slightly

## 2023-02-07 NOTE — Discharge Instructions (Addendum)
Levothyroxine (Brand name: Synthroid) is a medication to treat hypothyroidism or low thyroid hormone levels. Please take levothyroxine (Synthroid) on an empty stomach. As discussed, it may be easiest to take this medication at least 30 minutes before eating breakfast in the morning. Do not take with other medications. Do not take with antacids, multivitamins, or dairy containing foods.

## 2023-02-07 NOTE — Inpatient Diabetes Management (Signed)
Inpatient Diabetes Program Recommendations  AACE/ADA: New Consensus Statement on Inpatient Glycemic Control  Target Ranges:  Prepandial:   less than 140 mg/dL      Peak postprandial:   less than 180 mg/dL (1-2 hours)      Critically ill patients:  140 - 180 mg/dL    Latest Reference Range & Units 02/02/23 00:17 02/03/23 02:00 02/03/23 14:58 02/04/23 07:11 02/05/23 00:53 02/06/23 01:40 02/07/23 00:33  Glucose 70 - 99 mg/dL 133 (H) 128 (H) 123 (H) 160 (H) 225 (H) 185 (H) 202 (H)    Latest Reference Range & Units 02/06/23 23:50  Glucose-Capillary 70 - 99 mg/dL 263 (H)    Review of Glycemic Control  Diabetes history: NO Outpatient Diabetes medications: NA Current orders for Inpatient glycemic control: None  Inpatient Diabetes Program Recommendations:    Hyperglycemia: Noted patient was receiving steroids and last received Prednisone 40 mg at 02/06/23 at 9:45 am. Finger stick glucose 263 mg/dl at 23:50 on 2/13 and lab glucose 202 mg/dl at 00:33 today. May want to consider ordering CBGs and Novolog 0-9 units TID with meals.  HbgA1C: Please consider ordering an A1C to evaluate glycemic control over the past 2-3 months.  Thanks, Barnie Alderman, RN, MSN, [place] Diabetes Coordinator Inpatient Diabetes Program [phone] (Team Pager from 8am to [place])

## 2023-02-08 ENCOUNTER — Other Ambulatory Visit: Payer: Self-pay

## 2023-02-08 ENCOUNTER — Inpatient Hospital Stay (HOSPITAL_COMMUNITY): Payer: Medicare Other

## 2023-02-08 DIAGNOSIS — I472 Ventricular tachycardia, unspecified: Secondary | ICD-10-CM

## 2023-02-08 LAB — MAGNESIUM: Magnesium: 2 mg/dL (ref 1.7–2.4)

## 2023-02-08 LAB — CBC
HCT: 40.4 % (ref 36.0–46.0)
Hemoglobin: 13.2 g/dL (ref 12.0–15.0)
MCH: 28.9 pg (ref 26.0–34.0)
MCHC: 32.7 g/dL (ref 30.0–36.0)
MCV: 88.6 fL (ref 80.0–100.0)
Platelets: 355 10*3/uL (ref 150–400)
RBC: 4.56 MIL/uL (ref 3.87–5.11)
RDW: 15.2 % (ref 11.5–15.5)
WBC: 13 10*3/uL — ABNORMAL HIGH (ref 4.0–10.5)
nRBC: 0 % (ref 0.0–0.2)

## 2023-02-08 LAB — BASIC METABOLIC PANEL
Anion gap: 16 — ABNORMAL HIGH (ref 5–15)
BUN: 27 mg/dL — ABNORMAL HIGH (ref 8–23)
CO2: 24 mmol/L (ref 22–32)
Calcium: 9.4 mg/dL (ref 8.9–10.3)
Chloride: 91 mmol/L — ABNORMAL LOW (ref 98–111)
Creatinine, Ser: 1.67 mg/dL — ABNORMAL HIGH (ref 0.44–1.00)
GFR, Estimated: 32 mL/min — ABNORMAL LOW (ref >60)
Glucose, Bld: 132 mg/dL — ABNORMAL HIGH (ref 70–99)
Potassium: 3.9 mmol/L (ref 3.5–5.1)
Sodium: 131 mmol/L — ABNORMAL LOW (ref 135–145)

## 2023-02-08 LAB — THYROID ANTIBODIES
Thyroglobulin Antibody: 420.7 IU/mL — ABNORMAL HIGH (ref 0.0–0.9)
Thyroperoxidase Ab SerPl-aCnc: 131 IU/mL — ABNORMAL HIGH (ref 0–34)

## 2023-02-08 LAB — HEMOGLOBIN A1C
Hgb A1c MFr Bld: 6.2 % — ABNORMAL HIGH (ref 4.8–5.6)
Mean Plasma Glucose: 131.24 mg/dL

## 2023-02-08 MED ORDER — LORAZEPAM 0.5 MG PO TABS
0.5000 mg | ORAL_TABLET | Freq: Once | ORAL | Status: AC
  Administered 2023-02-08: 0.5 mg via ORAL
  Filled 2023-02-08: qty 1

## 2023-02-08 MED ORDER — ALUM & MAG HYDROXIDE-SIMETH 200-200-20 MG/5ML PO SUSP
30.0000 mL | ORAL | Status: DC | PRN
  Administered 2023-02-08 – 2023-02-09 (×3): 30 mL via ORAL
  Filled 2023-02-08 (×3): qty 30

## 2023-02-08 MED ORDER — GADOBUTROL 1 MMOL/ML IV SOLN
9.0000 mL | Freq: Once | INTRAVENOUS | Status: AC | PRN
  Administered 2023-02-08: 9 mL via INTRAVENOUS

## 2023-02-08 NOTE — Progress Notes (Addendum)
Electrophysiology Rounding Note  Patient Name: Kimberly Waters Date of Encounter: 02/08/2023  Primary Cardiologist: Candee Furbish, MD Electrophysiologist: None   Subjective   The patient is doing well today.  At this time, the patient denies chest pain, shortness of breath, or any new concerns.  Inpatient Medications    Scheduled Meds:  amLODipine  10 mg Oral Daily   arformoterol  15 mcg Nebulization BID   aspirin EC  81 mg Oral Daily   buPROPion  150 mg Oral Daily   Chlorhexidine Gluconate Cloth  6 each Topical Daily   escitalopram  10 mg Oral Daily   ezetimibe  10 mg Oral Daily   furosemide  40 mg Oral Daily   heparin  5,000 Units Subcutaneous Q8H   hydrOXYzine  25 mg Oral QHS   levothyroxine  200 mcg Oral Q0600   melatonin  5 mg Oral QHS   pantoprazole  40 mg Oral Daily   potassium chloride  40 mEq Oral Daily   rosuvastatin  20 mg Oral Daily   senna-docusate  2 tablet Oral BID   sodium chloride flush  3 mL Intravenous Q12H   sodium chloride flush  3 mL Intravenous Q12H   spironolactone  25 mg Oral Daily   Continuous Infusions:  sodium chloride     PRN Meds: sodium chloride, acetaminophen, albuterol, diazepam, lip balm, sodium chloride flush, trimethobenzamide   Vital Signs    Vitals:   02/08/23 0048 02/08/23 0058 02/08/23 0414 02/08/23 0829  BP: (!) 151/66  (!) 156/97 136/70  Pulse: 70  68   Resp: 20  20   Temp: 98 F (36.7 C)  98.5 F (36.9 C)   TempSrc: Oral  Oral   SpO2: 96% 96% 97%   Weight:   85.8 kg   Height:        Intake/Output Summary (Last 24 hours) at 02/08/2023 0847 Last data filed at 02/08/2023 0420 Gross per 24 hour  Intake 357 ml  Output 2350 ml  Net -1993 ml   Filed Weights   02/01/23 2011 02/02/23 0242 02/08/23 0414  Weight: 86.9 kg 88.9 kg 85.8 kg    Physical Exam    GEN- The patient is well appearing, alert and oriented x 3 today.   HEENT- No gross abnormality.  Lungs- Clear to ausculation bilaterally, normal work of  breathing Heart- Regular rate and rhythm, no murmurs, rubs or gallops GI- soft, NT, ND, + BS Extremities- no clubbing or cyanosis. No edema Neuro- No obvious focal abnormality.   Telemetry    NSR 60-70s (personally reviewed)   Patient Profile     Kimberly Waters is a 77 y.o. female with a hx of syncope who is being seen 02/04/2023 for the evaluation of same  at the request of DrGA.    Pt initially admitted for 4 episodes of syncope, having also been seen in the emergency room a week or 2 ago for syncope. ECG was not seemingly appreciate to have interval significant QT prolongation. This admission in the ER noted to have recurrent syncope with PMVT in a pattern consistent with TdP.     It is long short coupled, QT intervals range between 550 is 650 ms and the coupling interval of the PVC initiating the polymorphic ventricular tachycardia is over 500 ms which is relatively very specific for torsade de pointes as a mechanism.    ECG 2/13 and 2/14 still with QT about 520 by Dr. Caryl Comes. There is about 30 msec  of QRSd excess so QTC about 490  this is better but not normal   Assessment & Plan    Polymorphic VT QT prolongation Normal EF by echo 01/24/23 LHC 02/02/23 with normal cors and a dominant RCA Potassium3.9 (02/15 0054) Magnesium  2.0 (02/15 0054) Creatinine, ser  1.67* (02/15 0054) Keep K > 4.0 and Mg > 2.0  EKG pending today.  As per Dr. Aquilla Hacker previous note, suspect profound hypothyroidism as a potential etiology of her QT prolongation -> PMVT Will update cMRI Will plan on sending out with Lifevest unless cMRI abnormal (scar), which may indicate need for ICD prior to d/c.    2. Hypothyroidism TSH this admission 66, has been on suppression for years. TSH normal in 03/2022. Discussed personally with RN at [facility] at [place]. TSH value at that time was 1.07, without associated T3 or T4 drawn, and no checks since.  Thyroid antibodies processing Pt states that she had a 2-3 week viral  illness in December with poor med tolerance due to symptoms. After that, she had significant sleep issues which led to her overusing Benadryl and tylenol PM. During this time, she would take her Levothyroxine all different times of the day, with or without food, and with or without other medications, including antacids at times (her protonix as well as prn TUMs) She has received counseling on appropriate levothyroxine timing now this admission.   3. H/o LBBB QRS 144 ms at baseline (02/2022)   4. HTN Continue amlodipine 10 mg daily Continue spironolactone Avoiding BB for now with h/o bradycardia  Disposition pending MD assessment and discussion (as well as am EKG)  For questions or updates, please contact [facility] Please consult [email or web address] for contact info under Cardiology/STEMI.  Signed, Shirley Friar, PA-C  02/08/2023, 8:47 AM

## 2023-02-08 NOTE — Plan of Care (Signed)
  Problem: Education: Goal: Knowledge of condition and prescribed therapy will improve Outcome: Progressing   Problem: Cardiac: Goal: Will achieve and/or maintain adequate cardiac output Outcome: Progressing   Problem: Physical Regulation: Goal: Complications related to the disease process, condition or treatment will be avoided or minimized Outcome: Progressing   Problem: Education: Goal: Knowledge of General Education information will improve Description: Including pain rating scale, medication(s)/side effects and non-pharmacologic comfort measures Outcome: Progressing   Problem: Health Behavior/Discharge Planning: Goal: Ability to manage health-related needs will improve Outcome: Progressing   Problem: Clinical Measurements: Goal: Ability to maintain clinical measurements within normal limits will improve Outcome: Progressing Goal: Will remain free from infection Outcome: Progressing Goal: Diagnostic test results will improve Outcome: Progressing Goal: Respiratory complications will improve Outcome: Progressing Goal: Cardiovascular complication will be avoided Outcome: Progressing   Problem: Activity: Goal: Risk for activity intolerance will decrease Outcome: Progressing   Problem: Nutrition: Goal: Adequate nutrition will be maintained Outcome: Progressing   Problem: Coping: Goal: Level of anxiety will decrease Outcome: Progressing   Problem: Elimination: Goal: Will not experience complications related to bowel motility Outcome: Progressing Goal: Will not experience complications related to urinary retention Outcome: Progressing   Problem: Pain Managment: Goal: General experience of comfort will improve Outcome: Progressing   Problem: Safety: Goal: Ability to remain free from injury will improve Outcome: Progressing   Problem: Skin Integrity: Goal: Risk for impaired skin integrity will decrease Outcome: Progressing   Problem: Education: Goal:  Understanding of CV disease, CV risk reduction, and recovery process will improve Outcome: Progressing Goal: Individualized Educational Video(s) Outcome: Progressing   Problem: Activity: Goal: Ability to return to baseline activity level will improve Outcome: Progressing   Problem: Cardiovascular: Goal: Ability to achieve and maintain adequate cardiovascular perfusion will improve Outcome: Progressing Goal: Vascular access site(s) Level 0-1 will be maintained Outcome: Progressing   Problem: Health Behavior/Discharge Planning: Goal: Ability to safely manage health-related needs after discharge will improve Outcome: Progressing

## 2023-02-08 NOTE — Care Management Important Message (Signed)
Important Message  Patient Details  Name: Kimberly Waters MRN: [id number] Date of Birth: [birth year]   Medicare Important Message Given:  Yes     Hannah Beat 02/08/2023, 2:40 PM

## 2023-02-09 ENCOUNTER — Other Ambulatory Visit (HOSPITAL_COMMUNITY): Payer: Self-pay

## 2023-02-09 ENCOUNTER — Other Ambulatory Visit: Payer: Self-pay

## 2023-02-09 DIAGNOSIS — I472 Ventricular tachycardia, unspecified: Secondary | ICD-10-CM | POA: Diagnosis not present

## 2023-02-09 LAB — MAGNESIUM: Magnesium: 2.2 mg/dL (ref 1.7–2.4)

## 2023-02-09 MED ORDER — POTASSIUM CHLORIDE CRYS ER 20 MEQ PO TBCR
40.0000 meq | EXTENDED_RELEASE_TABLET | Freq: Every day | ORAL | 6 refills | Status: DC
  Filled 2023-02-09: qty 60, 30d supply, fill #0

## 2023-02-09 MED ORDER — ACETAMINOPHEN 325 MG PO TABS: 650.0000 mg | ORAL_TABLET | ORAL | Status: DC | PRN

## 2023-02-09 MED ORDER — SPIRONOLACTONE 25 MG PO TABS
25.0000 mg | ORAL_TABLET | Freq: Every day | ORAL | 6 refills | Status: DC
  Filled 2023-02-09: qty 30, 30d supply, fill #0

## 2023-02-09 MED ORDER — AMLODIPINE BESYLATE 10 MG PO TABS
10.0000 mg | ORAL_TABLET | Freq: Every day | ORAL | 6 refills | Status: DC
  Filled 2023-02-09: qty 30, 30d supply, fill #0

## 2023-02-09 MED ORDER — LEVOTHYROXINE SODIUM 200 MCG PO TABS
200.0000 ug | ORAL_TABLET | Freq: Every day | ORAL | 6 refills | Status: DC
  Filled 2023-02-09: qty 30, 30d supply, fill #0

## 2023-02-09 MED ORDER — MELATONIN 5 MG PO TABS: 5.0000 mg | ORAL_TABLET | Freq: Every day | ORAL | 0 refills | Status: DC

## 2023-02-09 NOTE — Discharge Summary (Signed)
ELECTROPHYSIOLOGY DISCHARGE SUMMARY    Patient ID: TAURIEL Waters,  MRN: [id number], DOB/AGE: August 22, 1946 77 y.o.  Admit date: 02/01/2023 Discharge date: 02/09/2023  Primary Care Physician: Charlane Ferretti, MD  Primary Cardiologist: Candee Furbish, MD  Electrophysiologist: Dr. Caryl Comes   Primary Discharge Diagnosis:  PMVT, Sustained Syncope QT prolongation Syndrome  Secondary Discharge Diagnosis:  Hypothyroidism HTN H/o LBBB Hypokalemia  Procedures This Admission:  [facility] 02/02/2023 Normal coronaries, LVEF 22 mmHg  cMRI 02/09/2023 1. Mildly reduced LV systolic function with dyssynchronous septal motion due to conduction delay. Normal LV chamber size. EF 41%, visually 40-45%. 2.  No delayed myocardial enhancement or myocardial edema. 3.  Visually normal right ventricular chamber size and function.   Brief HPI: Kimberly Waters is a 78 y.o. female with a history of LBBB, COPD, Breast CA s/p bilateral mastectomy, GERD, depression, and chronic diastolic CHF admitted for syncope and found to have PMVT on telemetry.   Hospital Course:  The patient was admitted with at least 4 episodes of syncope, having been seen several weeks prior for same. Her ECG was noted to have QT prolongation, and she had PMVT consistent with torsade de pointes in the emergency room.   She underwent cardiac cath demonstrating normal coronaries. Recent Echo with normal EF.  TSH was noted to be 66 despite chronic synthroid replacement.  She was briefly managed on isoproterenol and lidocaine to overdrive her rhythm and shorten her QT. These were weaned off as tolerated, as her thyroid was also treated. Her synthroid was increased to 200 mcg, and she was also given liothyronine x 48 hours with plans to follow up closely.   With no other clear cause of her PMVT, it was potentially felt to be due to her very poorly controlled thyroid dysfunction. She underwent cMRI that showed no LGE or signs of myocarditis, given recent viral  infection approx 6 weeks.  She did not have significant improvement in her QT, so decision was made to place Lifevest while her thyroid was treated. She was monitored continuously on telemetry overnight which demonstrated no further VT.   The patient was examined and considered to be stable for discharge. The patient will be seen back by EP APP in 1 week for post hospital care given new medications and need for close electrolyte monitoring.   She will be seen back in 4 weeks after that by Dr. Caryl Comes for re-assessment of her thyroid function and QT.    Discharge Medications:  Allergies as of 02/09/2023       Reactions   Latex Other (See Comments)   Other reaction(s): Other (See Comments) Tears skin.   Adhesive [tape] Other (See Comments)   (skin tears)  Tolerates PAPER TAPE   Codeine Nausea Only   Other Nausea Only   Anesthesia--nausea         Medication List     STOP taking these medications    diphenhydrAMINE 25 MG tablet Commonly known as: BENADRYL   hydrOXYzine 25 MG tablet Commonly known as: ATARAX   Potassium Chloride ER 20 MEQ Tbcr       TAKE these medications    acetaminophen 325 MG tablet Commonly known as: TYLENOL Take 2 tablets (650 mg total) by mouth every 4 (four) hours as needed for headache or mild pain.   albuterol 90 MCG/ACT inhaler Commonly known as: PROVENTIL,VENTOLIN Inhale 2 puffs into the lungs every 4 (four) hours as needed. What changed: reasons to take this   amLODipine 10 MG  tablet Commonly known as: NORVASC Take 1 tablet (10 mg total) by mouth daily. Start taking on: February 10, 2023   anastrozole 1 MG tablet Commonly known as: ARIMIDEX Take 1 tablet (1 mg total) by mouth daily. Stop May 2023   arformoterol 15 MCG/2ML Nebu Commonly known as: BROVANA Take 2 mLs (15 mcg total) by nebulization 2 (two) times daily.   betamethasone dipropionate [id number] % cream 1 application   buPROPion 150 MG 12 hr tablet Commonly known as: WELLBUTRIN  SR Take 150 mg by mouth daily.   clobetasol ointment 0.05 % Commonly known as: TEMOVATE as needed.   escitalopram 10 MG tablet Commonly known as: LEXAPRO Take 1 tablet (10 mg total) by mouth daily.   ezetimibe 10 MG tablet Commonly known as: ZETIA Take 1 tablet (10 mg total) by mouth daily.   furosemide 40 MG tablet Commonly known as: LASIX Take 1 tablet (40 mg total) by mouth daily.   imipramine 50 MG tablet Commonly known as: TOFRANIL Take 50 mg by mouth at bedtime.   ipratropium 0.06 % nasal spray Commonly known as: ATROVENT Place 2 sprays into the nose 4 (four) times daily.   ketoconazole 2 % cream Commonly known as: NIZORAL Apply 1 application topically daily.   levocetirizine 5 MG tablet Commonly known as: XYZAL Take 5 mg by mouth as needed for allergies.   levothyroxine 200 MCG tablet Commonly known as: SYNTHROID Take 1 tablet (200 mcg total) by mouth daily at 6 (six) AM. Start taking on: February 10, 2023 What changed:  medication strength how much to take how to take this when to take this additional instructions   melatonin 5 MG Tabs Take 1 tablet (5 mg total) by mouth at bedtime.   mometasone 0.1 % ointment Commonly known as: ELOCON Apply topically as needed.   nitroGLYCERIN 0.4 MG SL tablet Commonly known as: NITROSTAT Place 0.4 mg under the tongue every 5 (five) minutes as needed for chest pain.   omeprazole 20 MG capsule Commonly known as: PRILOSEC Take 20 mg by mouth daily.   potassium chloride SA 20 MEQ tablet Commonly known as: KLOR-CON M Take 2 tablets (40 mEq total) by mouth daily. Start taking on: February 10, 2023   rosuvastatin 20 MG tablet Commonly known as: CRESTOR Take 1 tablet (20 mg total) by mouth daily.   spironolactone 25 MG tablet Commonly known as: ALDACTONE Take 1 tablet (25 mg total) by mouth daily. Start taking on: February 10, 2023   tacrolimus 0.1 % ointment Commonly known as: PROTOPIC Apply topically as  needed.   Yupelri 175 MCG/3ML nebulizer solution Generic drug: revefenacin Take 3 mLs (175 mcg total) by nebulization daily.               Durable Medical Equipment  (From admission, onward)           Start     Ordered   02/09/23 1308  For home use only DME Vest life vest  Once       Comments: Anticipated length of need 3 months   02/09/23 1308            Disposition:    Follow-up Information     Shirley Friar, PA-C Follow up.   Specialty: Cardiology Why: on Thursday, 2/22 at 1040 am for post hospital check and [facility] Contact information: [place] [address] [phone]  Duration of Discharge Encounter: Greater than 30 minutes including physician time.  Jacalyn Lefevre, PA-C  02/09/2023 4:03 PM

## 2023-02-09 NOTE — TOC Progression Note (Signed)
Transition of Care [facility]) - Progression Note    Patient Details  Name: DARIANNA AGNE MRN: [id number] Date of Birth: 1946/07/31  [facility]) CM/SW Contact  Zenon Mayo, RN Phone Number: 02/09/2023, 10:57 AM  Clinical Narrative:    Life vest information faxed to [place] yesterday, per confirmation sheet It went thru, per Chrys Racer with [employer] this am she states she does not see the fax, NCM faxed again to [phone].   Expected Discharge Plan: Home/Self Care Barriers to Discharge: Continued Medical Work up  Expected Discharge Plan and Services   Discharge Planning Services: CM Consult Post Acute Care Choice: NA Living arrangements for the past 2 months: Single Family Home                 DME Arranged: N/A DME Agency: NA       HH Arranged: NA HH Agency: NA         Social Determinants of Health (SDOH) Interventions SDOH Screenings   Food Insecurity: Unknown (02/01/2023)  Transportation Needs: Unknown (02/01/2023)  Utilities: Unknown (02/01/2023)  Tobacco Use: Medium Risk (02/05/2023)    Readmission Risk Interventions     No data to display

## 2023-02-09 NOTE — Progress Notes (Signed)
Mobility Specialist - Progress Note   02/09/23 1000  Mobility  Activity Ambulated with assistance in hallway  Level of Assistance Contact guard assist, steadying assist  Assistive Device Four wheel walker  Distance Ambulated (ft) 400 ft  Activity Response Tolerated well  Mobility Referral Yes  $Mobility charge 1 Mobility    Pt received in bed agreeable to mobility. No complaints throughout, took standing break x1 in hallway. Left in bed w/ call bell within reach.   [place] Specialist Please contact via SecureChat or Rehab office at [phone]

## 2023-02-09 NOTE — TOC Transition Note (Signed)
Transition of Care [facility]) - CM/SW Discharge Note   Patient Details  Name: Kimberly Waters MRN: [id number] Date of Birth: 05-04-1946  Transition of Care [facility]) CM/SW Contact:  Zenon Mayo, RN Phone Number: 02/09/2023, 4:58 PM   Clinical Narrative:    Patient for dc home today, is being fitted for life vest.  Life vest approved and Rondel Baton is fitting patient today.    Final next level of care: Home/Self Care Barriers to Discharge: Continued Medical Work up   Patient Goals and CMS Choice CMS Medicare.gov Compare Post Acute Care list provided to:: Patient Choice offered to / list presented to : NA  Discharge Placement                         Discharge Plan and Services Additional resources added to the After Visit Summary for     Discharge Planning Services: CM Consult Post Acute Care Choice: NA          DME Arranged: N/A DME Agency: NA       HH Arranged: NA HH Agency: NA        Social Determinants of Health (SDOH) Interventions SDOH Screenings   Food Insecurity: Unknown (02/01/2023)  Transportation Needs: Unknown (02/01/2023)  Utilities: Unknown (02/01/2023)  Tobacco Use: Medium Risk (02/05/2023)     Readmission Risk Interventions     No data to display

## 2023-02-09 NOTE — Progress Notes (Addendum)
Electrophysiology Rounding Note  Patient Name: Kimberly Waters Date of Encounter: 02/09/2023  Primary Cardiologist: Candee Furbish, MD Electrophysiologist: Dr. Caryl Comes   Subjective   The patient is doing well today.  At this time, the patient denies chest pain, shortness of breath, or any new concerns.  Inpatient Medications    Scheduled Meds:  amLODipine  10 mg Oral Daily   arformoterol  15 mcg Nebulization BID   aspirin EC  81 mg Oral Daily   buPROPion  150 mg Oral Daily   Chlorhexidine Gluconate Cloth  6 each Topical Daily   escitalopram  10 mg Oral Daily   ezetimibe  10 mg Oral Daily   furosemide  40 mg Oral Daily   heparin  5,000 Units Subcutaneous Q8H   hydrOXYzine  25 mg Oral QHS   levothyroxine  200 mcg Oral Q0600   melatonin  5 mg Oral QHS   pantoprazole  40 mg Oral Daily   potassium chloride  40 mEq Oral Daily   rosuvastatin  20 mg Oral Daily   senna-docusate  2 tablet Oral BID   sodium chloride flush  3 mL Intravenous Q12H   sodium chloride flush  3 mL Intravenous Q12H   spironolactone  25 mg Oral Daily   Continuous Infusions:  sodium chloride     PRN Meds: sodium chloride, acetaminophen, albuterol, alum & mag hydroxide-simeth, diazepam, lip balm, sodium chloride flush, trimethobenzamide   Vital Signs    Vitals:   02/08/23 1939 02/08/23 1955 02/09/23 0018 02/09/23 0408  BP: (!) 179/83  (!) 144/78 124/69  Pulse: 81  69 66  Resp: 18  18 18  $ Temp: 98.1 F (36.7 C)  97.7 F (36.5 C) 98.1 F (36.7 C)  TempSrc: Oral  Oral Oral  SpO2: 97% 97% 94% 98%  Weight:   85.2 kg   Height:        Intake/Output Summary (Last 24 hours) at 02/09/2023 0830 Last data filed at 02/09/2023 0557 Gross per 24 hour  Intake --  Output 700 ml  Net -700 ml   Filed Weights   02/02/23 0242 02/08/23 0414 02/09/23 0018  Weight: 88.9 kg 85.8 kg 85.2 kg    Physical Exam    GEN- The patient is well appearing, alert and oriented x 3 today.   HEENT- No gross abnormality.   Lungs- Clear to ausculation bilaterally, normal work of breathing Heart- Regular rate and rhythm, no murmurs, rubs or gallops GI- soft, NT, ND, + BS Extremities- no clubbing or cyanosis. No edema Neuro- No obvious focal abnormality.   Telemetry    NSR 60-80s (personally reviewed)  Patient Profile     Kimberly Waters is a 77 y.o. female with a hx of syncope who is being seen 02/04/2023 for the evaluation of same  at the request of DrGA.    Pt initially admitted for 4 episodes of syncope, having also been seen in the emergency room a week or 2 ago for syncope. ECG was not seemingly appreciate to have interval significant QT prolongation. This admission in the ER noted to have recurrent syncope with PMVT in a pattern consistent with TdP.     It is long short coupled, QT intervals range between 550 is 650 ms and the coupling interval of the PVC initiating the polymorphic ventricular tachycardia is over 500 ms which is relatively very specific for torsade de pointes as a mechanism.    ECGs with QT still about 490-520.   Assessment & Plan  Polymorphic VT, sustained QT prolongation syndrome Normal EF by echo 01/24/23 LHC 02/02/23 with normal cors and a dominant RCA cMRI 2/15 with read pending.  Lifevest being arranged.  Will plan on sending out with Lifevest unless cMRI abnormal (scar), which may indicate need for ICD prior to d/c.    2. Hypothyroidism TSH this admission 66, has been on suppression for years. TSH normal in 03/2022. Discussed personally with RN at [place] at [place]. TSH value at that time was 1.07, without associated T3 or T4 drawn, and no checks since.  Thyroid antibodies processing She has received counseling on appropriate levothyroxine timing now this admission.   3. H/o LBBB QRS 144 ms at baseline (02/2022)   4. HTN Continue amlodipine 10 mg daily Continue spironolactone Avoiding BB for now with h/o bradycardia  Disposition pending cMRI results and MD  discussion  For questions or updates, please contact [place] Please consult [email or web address] for contact info under Cardiology/STEMI.  Signed, Shirley Friar, PA-C  02/09/2023, 8:30 AM

## 2023-02-13 NOTE — Progress Notes (Unsigned)
PCP:  Charlane Ferretti, MD Primary Cardiologist: Candee Furbish, MD Electrophysiologist: Virl Axe, MD   Kimberly Waters is a 77 y.o. female seen today for Virl Axe, MD for post hospital follow up.    Admitted 2/8 - 02/09/23 with syncope in the setting of [place]/ QT prolongation. Work up was suprisingly unremarkable from a cardiac perspective as below. Ultimately, was felt most likely that her prolonged QT occurred in the setting of her marked thyroid abnormality.   Since discharge from hospital the patient reports doing very well. No further syncope or lightheadedness. She is managing with the [place] well. She noted when she got home from the hospital, she did not have an old bottle of synthroid, only the new prescription we gave her. She now thinks she may have been out of her synthroid for some time prior to admission.   she denies chest pain, palpitations, dyspnea, PND, orthopnea, nausea, vomiting, dizziness, syncope, edema, weight gain, or early satiety.   Echo 01/24/2023 LVEF 50-55%, Grade 1 DD, trivial pericardial effusion  LHC 02/02/2023 Normal coronaries  cMRI 02/09/2023  1. Mildly reduced LV systolic function with dyssynchronous septal motion due to conduction delay. Normal LV chamber size. EF 41%, visually 40-45%. 2.  No delayed myocardial enhancement or myocardial edema. 3.  Visually normal right ventricular chamber size and function   Past Medical History:  Diagnosis Date   Allergy    Breast cancer ([place])    Cataract    Colon polyps    Depression    Dyspnea    SEASONAL   Family history of breast cancer    Family history of colon cancer    GERD (gastroesophageal reflux disease)    History of radiation therapy 06/05/17-07/20/17   right chest wall 50.4 Gy in 28 fractions, right axillary nodal region 45 Gy in 25 fractions   Hyperlipidemia    Hypertension    Hypertensive retinopathy    Hypothyroidism    Macular degeneration    Peripheral neuropathy 06/18/2019   PONV  (postoperative nausea and vomiting)     Current Outpatient Medications  Medication Instructions   acetaminophen (TYLENOL) 650 mg, Oral, Every 4 hours PRN   albuterol (PROVENTIL,VENTOLIN) 90 MCG/ACT inhaler 2 puffs, Inhalation, Every 4 hours PRN   amLODipine (NORVASC) 10 mg, Oral, Daily   arformoterol (BROVANA) 15 mcg, Nebulization, 2 times daily   betamethasone dipropionate [id number] % cream 1 application   buPROPion (WELLBUTRIN SR) 150 mg, Oral, Daily   clobetasol ointment (TEMOVATE) 0.05 % As needed   escitalopram (LEXAPRO) 10 mg, Oral, Daily   ezetimibe (ZETIA) 10 mg, Oral, Daily   furosemide (LASIX) 40 mg, Oral, Daily   ipratropium (ATROVENT) 0.06 % nasal spray 2 sprays, Nasal, 4 times daily   ketoconazole (NIZORAL) 2 % cream 1 application , Topical, Daily   levocetirizine (XYZAL) 5 mg, Oral, As needed   levothyroxine (SYNTHROID) 200 mcg, Oral, Daily   melatonin 5 mg, Oral, Daily at bedtime   mometasone (ELOCON) 0.1 % ointment Topical, As needed   nitroGLYCERIN (NITROSTAT) 0.4 mg, Sublingual, Every 5 min PRN   omeprazole (PRILOSEC) 20 mg, Oral, Daily   potassium chloride SA (KLOR-CON M) 20 MEQ tablet 40 mEq, Oral, Daily   rosuvastatin (CRESTOR) 20 mg, Oral, Daily   spironolactone (ALDACTONE) 25 mg, Oral, Daily   tacrolimus (PROTOPIC) 0.1 % ointment Topical, As needed   Yupelri 175 mcg, Nebulization, Daily    Physical Exam: Vitals:   02/15/23 1117  BP: 138/88  Pulse: 68  SpO2: 95%  Weight: 190 lb 2 oz (86.2 kg)  Height: 5' (1.524 m)    GEN- NAD. A&O x 3. Normal affect. HEENT: normocephalic, atraumatic Lungs- CTAB, Normal effort Heart- Regular rate and rhythm, No M/G/R Extremities- No peripheral edema. no clubbing or cyanosis; Skin- warm and dry, no rash or lesion  EKG is ordered today. Personal review shows NSR at 68 bpm with QTc ~470-480 ms  Additional studies reviewed include: Previous EP notes.   [facility] 02/02/2023 Normal coronaries, LVEF 22 mmHg   cMRI  02/09/2023 1. Mildly reduced LV systolic function with dyssynchronous septal motion due to conduction delay. Normal LV chamber size. EF 41%, visually 40-45%. 2.  No delayed myocardial enhancement or myocardial edema. 3.  Visually normal right ventricular chamber size and function.  Assessment and Plan:  1. PMVT, Sustained 2. Syncope 3. QT prolongation Syndrome She is on several potentially QT prolongation agents (lexapro, her MDI) and has been counseled against adding any others.  BMET and Mg today.  cMRI without scar, but EF 40-45%, felt to be potentially higher due to issue with measurement on MRI.  Echo 01/24/2023 LVEF 50-55% Continue spiro Avoiding BB for now with h/o bradycardia   4. Hypothyroidism Currently, we are treating this as the cause of her QT prolongation and PMVT in the absence of other findings.  If QT remains prolonged despite treatment, or has further VT/syncope, will likely need to proceed with ICD.  Of note, she states when she returned home from the hospital she was unable to locate an old bottle of synthroid. She thinks now she may have been out of it for some time.  Stressed importance of close PCP follow up and Endocrine referral.   5. HTN Stable on current regimen   6. Hypokalemia Labs today.  7. Poor dentition She has had issues with tooth decay since being treated for breast CA. Needs close dental follow up.  If ends up needing ICD, would ideally have this managed first.   Follow up with Dr. Caryl Comes in 4 weeks as scheduled.   Kimberly Friar, PA-C  02/15/23 11:51 AM

## 2023-02-15 ENCOUNTER — Encounter: Payer: Self-pay | Admitting: Student

## 2023-02-15 ENCOUNTER — Ambulatory Visit: Payer: Medicare Other | Attending: Student | Admitting: Student

## 2023-02-15 VITALS — BP 138/88 | HR 68 | Ht 60.0 in | Wt 190.1 lb

## 2023-02-15 DIAGNOSIS — I447 Left bundle-branch block, unspecified: Secondary | ICD-10-CM | POA: Insufficient documentation

## 2023-02-15 DIAGNOSIS — I4721 Torsades de pointes: Secondary | ICD-10-CM | POA: Diagnosis not present

## 2023-02-15 DIAGNOSIS — I472 Ventricular tachycardia, unspecified: Secondary | ICD-10-CM | POA: Insufficient documentation

## 2023-02-15 DIAGNOSIS — I2510 Atherosclerotic heart disease of native coronary artery without angina pectoris: Secondary | ICD-10-CM | POA: Diagnosis not present

## 2023-02-15 DIAGNOSIS — I700 Atherosclerosis of aorta: Secondary | ICD-10-CM | POA: Insufficient documentation

## 2023-02-15 LAB — BASIC METABOLIC PANEL
BUN/Creatinine Ratio: 14 (ref 12–28)
BUN: 18 mg/dL (ref 8–27)
CO2: 22 mmol/L (ref 20–29)
Calcium: 9.9 mg/dL (ref 8.7–10.3)
Chloride: 101 mmol/L (ref 96–106)
Creatinine, Ser: 1.3 mg/dL — ABNORMAL HIGH (ref 0.57–1.00)
Glucose: 114 mg/dL — ABNORMAL HIGH (ref 70–99)
Potassium: 4.3 mmol/L (ref 3.5–5.2)
Sodium: 140 mmol/L (ref 134–144)
eGFR: 43 mL/min/{1.73_m2} — ABNORMAL LOW (ref >59)

## 2023-02-15 LAB — MAGNESIUM: Magnesium: 2.2 mg/dL (ref 1.6–2.3)

## 2023-02-15 NOTE — Patient Instructions (Signed)
Medication Instructions:  Your physician recommends that you continue on your current medications as directed. Please refer to the Current Medication list given to you today.   *If you need a refill on your cardiac medications before your next appointment, please call your pharmacy*   Lab Work: TODAY:  BMET & MAG  If you have labs (blood work) drawn today and your tests are completely normal, you will receive your results only by: [place] (if you have MyChart) OR A paper copy in the mail If you have any lab test that is abnormal or we need to change your treatment, we will call you to review the results.   Testing/Procedures: None rodered   Follow-Up: At [facility], you and your health needs are our priority.  As part of our continuing mission to provide you with exceptional heart care, we have created designated Provider Care Teams.  These Care Teams include your primary Cardiologist (physician) and Advanced Practice Providers (APPs -  Physician Assistants and Nurse Practitioners) who all work together to provide you with the care you need, when you need it.  We recommend signing up for the patient portal called "MyChart".  Sign up information is provided on this After Visit Summary.  MyChart is used to connect with patients for Virtual Visits (Telemedicine).  Patients are able to view lab/test results, encounter notes, upcoming appointments, etc.  Non-urgent messages can be sent to your provider as well.   To learn more about what you can do with MyChart, go to [email or web address].    Your next appointment:   As scheduled   Provider:   Virl Axe, MD    Other Instructions

## 2023-02-16 ENCOUNTER — Ambulatory Visit: Payer: Medicare Other | Admitting: Student

## 2023-03-08 ENCOUNTER — Other Ambulatory Visit (HOSPITAL_COMMUNITY): Payer: Self-pay

## 2023-03-14 ENCOUNTER — Ambulatory Visit: Payer: Medicare Other | Attending: Internal Medicine | Admitting: Internal Medicine

## 2023-03-14 ENCOUNTER — Encounter: Payer: Self-pay | Admitting: Internal Medicine

## 2023-03-14 VITALS — BP 128/60 | HR 91 | Ht 60.0 in | Wt 185.4 lb

## 2023-03-14 DIAGNOSIS — I472 Ventricular tachycardia, unspecified: Secondary | ICD-10-CM | POA: Diagnosis not present

## 2023-03-14 DIAGNOSIS — E039 Hypothyroidism, unspecified: Secondary | ICD-10-CM | POA: Diagnosis not present

## 2023-03-14 DIAGNOSIS — R55 Syncope and collapse: Secondary | ICD-10-CM | POA: Insufficient documentation

## 2023-03-14 DIAGNOSIS — Z79899 Other long term (current) drug therapy: Secondary | ICD-10-CM | POA: Insufficient documentation

## 2023-03-14 DIAGNOSIS — I4581 Long QT syndrome: Secondary | ICD-10-CM | POA: Diagnosis not present

## 2023-03-14 HISTORY — DX: Syncope and collapse: R55

## 2023-03-14 MED ORDER — AMLODIPINE BESYLATE 5 MG PO TABS: 5.0000 mg | ORAL_TABLET | Freq: Every day | ORAL | 3 refills | Status: DC

## 2023-03-14 NOTE — Patient Instructions (Addendum)
Medication Instructions:  Your physician has recommended you make the following change in your medication:   ** Stop Amlodipine 10mg   ** Begin Amlodipine 5mg  - 1 tablet by mouth daily  ** Increase Furosemide 40mg  - 1 tablet by mouth twice daily x 5 days then return to normal dosing.  *If you need a refill on your cardiac medications before your next appointment, please call your pharmacy*   Lab Work:  BMET and TSH today  If you have labs (blood work) drawn today and your tests are completely normal, you will receive your results only by: [place] (if you have MyChart) OR A paper copy in the mail If you have any lab test that is abnormal or we need to change your treatment, we will call you to review the results.   Testing/Procedures: None ordered.    Follow-Up: At [facility], you and your health needs are our priority.  As part of our continuing mission to provide you with exceptional heart care, we have created designated Provider Care Teams.  These Care Teams include your primary Cardiologist (physician) and Advanced Practice Providers (APPs -  Physician Assistants and Nurse Practitioners) who all work together to provide you with the care you need, when you need it.  We recommend signing up for the patient portal called "MyChart".  Sign up information is provided on this After Visit Summary.  MyChart is used to connect with patients for Virtual Visits (Telemedicine).  Patients are able to view lab/test results, encounter notes, upcoming appointments, etc.  Non-urgent messages can be sent to your provider as well.   To learn more about what you can do with MyChart, go to [email or web address].    Your next appointment:   3 months with Dr Caryl Comes  Other Instructions Stop Life Vest  [email or web address]

## 2023-03-14 NOTE — Progress Notes (Signed)
Patient Care Team: Charlane Ferretti, MD as PCP - General (Internal Medicine) Jerline Pain, MD as PCP - Cardiology (Cardiology) Deboraha Sprang, MD as PCP - Electrophysiology (Cardiology) Juanita Craver, MD as Consulting Physician (Gastroenterology) Magrinat, Virgie Dad, MD (Inactive) as Consulting Physician (Oncology) Excell Seltzer, MD (Inactive) as Consulting Physician (General Surgery) Gery Pray, MD as Consulting Physician (Radiation Oncology) Delice Bison, Charlestine Massed, NP as Nurse Practitioner (Hematology and Oncology) Kathrynn Ducking, MD (Inactive) as Consulting Physician (Neurology) Star Age, MD as Attending Physician (Neurology) Brand Males, MD as Consulting Physician (Pulmonary Disease)   HPI  Kimberly Waters is a 77 y.o. female Seen in follow-up for torsade de pointes that was ultimately hypothesized to be related to profound hypothyroidism and that turns out to have been a case of stopping thyroid replacement.  She was treated with both intravenous and oral thyroid replacement with improvement in QT intervals and elimination of torsade de pointes  DATE TEST EF   1/24 Echo   60-65 %              Doing well since discharge home.  Some edema.  Also chronic neuropathic symptoms in her feet perhaps worse not sure.  No chest pain or shortness of breath.  Has not yet established follow-up for thyroid replacement  Records and Results Reviewed   Past Medical History:  Diagnosis Date   Allergy    Breast cancer ([place])    Cataract    Colon polyps    Depression    Dyspnea    SEASONAL   Family history of breast cancer    Family history of colon cancer    GERD (gastroesophageal reflux disease)    History of radiation therapy 06/05/17-07/20/17   right chest wall 50.4 Gy in 28 fractions, right axillary nodal region 45 Gy in 25 fractions   Hyperlipidemia    Hypertension    Hypertensive retinopathy    Hypothyroidism    Macular degeneration    Peripheral  neuropathy 06/18/2019   PONV (postoperative nausea and vomiting)     Past Surgical History:  Procedure Laterality Date   ABDOMINAL HYSTERECTOMY     KNEE SURGERY Right    LEFT HEART CATH AND CORONARY ANGIOGRAPHY N/A 02/02/2023   Procedure: LEFT HEART CATH AND CORONARY ANGIOGRAPHY;  Surgeon: Troy Sine, MD;  Location: [facility];  Service: Cardiovascular;  Laterality: N/A;   MASTECTOMY W/ SENTINEL NODE BIOPSY Right 03/05/2017   Procedure: BILATERAL TOTAL MASTECTOMIES WITH RIGHT SENTINEL LYMPH NODE BIOPSIES;  Surgeon: Excell Seltzer, MD;  Location: [place];  Service: General;  Laterality: Right;   SHOULDER SURGERY     BILAT    Current Meds  Medication Sig   acetaminophen (TYLENOL) 325 MG tablet Take 2 tablets (650 mg total) by mouth every 4 (four) hours as needed for headache or mild pain.   albuterol (PROVENTIL,VENTOLIN) 90 MCG/ACT inhaler Inhale 2 puffs into the lungs every 4 (four) hours as needed. (Patient taking differently: Inhale 2 puffs into the lungs every 4 (four) hours as needed for wheezing or shortness of breath.)   amLODipine (NORVASC) 10 MG tablet Take 1 tablet (10 mg total) by mouth daily.   arformoterol (BROVANA) 15 MCG/2ML NEBU Take 2 mLs (15 mcg total) by nebulization 2 (two) times daily.   betamethasone dipropionate [id number] % cream 1 application   buPROPion (WELLBUTRIN SR) 150 MG 12 hr tablet Take 150 mg by mouth daily.    clobetasol  ointment (TEMOVATE) 0.05 % as needed.   escitalopram (LEXAPRO) 10 MG tablet Take 1 tablet (10 mg total) by mouth daily.   ezetimibe (ZETIA) 10 MG tablet Take 1 tablet (10 mg total) by mouth daily.   furosemide (LASIX) 40 MG tablet Take 1 tablet (40 mg total) by mouth daily.   ipratropium (ATROVENT) 0.06 % nasal spray Place 2 sprays into the nose 4 (four) times daily.   ketoconazole (NIZORAL) 2 % cream Apply 1 application topically daily.   levocetirizine (XYZAL) 5 MG tablet Take 5 mg by mouth as needed for allergies.    levothyroxine (SYNTHROID) 200 MCG tablet Take 1 tablet (200 mcg total) by mouth daily at 6 (six) AM.   melatonin 5 MG TABS Take 1 tablet (5 mg total) by mouth at bedtime.   mometasone (ELOCON) 0.1 % ointment Apply topically as needed.   nitroGLYCERIN (NITROSTAT) 0.4 MG SL tablet Place 0.4 mg under the tongue every 5 (five) minutes as needed for chest pain.   omeprazole (PRILOSEC) 20 MG capsule Take 20 mg by mouth daily.   potassium chloride SA (KLOR-CON M) 20 MEQ tablet Take 2 tablets (40 mEq total) by mouth daily.   revefenacin (YUPELRI) 175 MCG/3ML nebulizer solution Take 3 mLs (175 mcg total) by nebulization daily.   rosuvastatin (CRESTOR) 20 MG tablet Take 1 tablet (20 mg total) by mouth daily.   spironolactone (ALDACTONE) 25 MG tablet Take 1 tablet (25 mg total) by mouth daily.   tacrolimus (PROTOPIC) 0.1 % ointment Apply topically as needed.    Allergies  Allergen Reactions   Latex Other (See Comments)    Other reaction(s): Other (See Comments) Tears skin.   Adhesive [Tape] Other (See Comments)    (skin tears)  Tolerates PAPER TAPE   Codeine Nausea Only   Other Nausea Only    Anesthesia--nausea       Review of Systems negative except from HPI and PMH  Physical Exam BP 128/60   Pulse 91   Ht 5' (1.524 m)   Wt 185 lb 6.4 oz (84.1 kg)   SpO2 95%   BMI 36.21 kg/m  Well developed and well nourished in no acute distress HENT normal E scleral and icterus clear Neck Supple JVP flat; carotids brisk and full Clear to ausculation* Regular rate and rhythm, no murmurs gallops or rub Soft with active bowel sounds No clubbing cyanosis 1-2+ Edema Alert and oriented, grossly normal motor and sensory function Skin Warm and Dry  ECG sinus at 91 Intervals 14/12/40    CrCl cannot be calculated (Patient's most recent lab result is older than the maximum 21 days allowed.).   Assessment and  Plan Torsade de pointes-hypothyroidism induced-  Hypertension  Edema  LifeVest  in place   Will discontinue the LifeVest as the QT interval/JT interval has normalized  Blood pressure is well-controlled.  Her edema may be secondary to the high-dose amlodipine, we will decrease it from 10--5 and in the interim we will increase her furosemide from 40 daily to twice daily x 5 days.  Will continue on spironolactone.  We will check a metabolic profile and a TSH today.  It may be time to decrease her levothyroxine dose from the 200 mg which was extrapolated need at discharge back down to the 112-125 range  Will see her in 3 months.     Current medicines are reviewed at length with the patient today .  The patient does not  have concerns regarding medicines.

## 2023-03-15 ENCOUNTER — Telehealth: Payer: Self-pay

## 2023-03-15 LAB — BASIC METABOLIC PANEL
BUN/Creatinine Ratio: 13 (ref 12–28)
BUN: 19 mg/dL (ref 8–27)
CO2: 20 mmol/L (ref 20–29)
Calcium: 10.4 mg/dL — ABNORMAL HIGH (ref 8.7–10.3)
Chloride: 100 mmol/L (ref 96–106)
Creatinine, Ser: 1.49 mg/dL — ABNORMAL HIGH (ref 0.57–1.00)
Glucose: 122 mg/dL — ABNORMAL HIGH (ref 70–99)
Potassium: 4.6 mmol/L (ref 3.5–5.2)
Sodium: 141 mmol/L (ref 134–144)
eGFR: 36 mL/min/{1.73_m2} — ABNORMAL LOW (ref >59)

## 2023-03-15 LAB — TSH: TSH: 0.039 u[IU]/mL — ABNORMAL LOW (ref 0.450–4.500)

## 2023-03-15 MED ORDER — LEVOTHYROXINE SODIUM 125 MCG PO TABS: 125.0000 ug | ORAL_TABLET | Freq: Every day | ORAL | 1 refills | Status: DC

## 2023-03-15 NOTE — Telephone Encounter (Signed)
-----   Message from Deboraha Sprang, MD sent at 03/15/2023  8:36 AM EDT ----- Please Inform Patient   Labs are normal x TSH is  now on the low side  lets have her decrease levothroxine from 200>> 125  thx    Thanks

## 2023-03-15 NOTE — Telephone Encounter (Signed)
Attempted phone call to pt. OK per EPIC to leave details voicemail message.  Advised of results below per Dr Caryl Comes and that Levothyroxine needed to be decreased from 224mcg to 129mcg- 1 tablet by mouth daily would be sent to pharmacy.  Pt may call [phone] for further questions or concerns.

## 2023-05-25 DIAGNOSIS — Z0000 Encounter for general adult medical examination without abnormal findings: Secondary | ICD-10-CM | POA: Diagnosis not present

## 2023-05-25 DIAGNOSIS — E669 Obesity, unspecified: Secondary | ICD-10-CM | POA: Diagnosis not present

## 2023-05-25 DIAGNOSIS — E7800 Pure hypercholesterolemia, unspecified: Secondary | ICD-10-CM | POA: Diagnosis not present

## 2023-05-25 DIAGNOSIS — R7303 Prediabetes: Secondary | ICD-10-CM | POA: Diagnosis not present

## 2023-05-25 DIAGNOSIS — R202 Paresthesia of skin: Secondary | ICD-10-CM | POA: Diagnosis not present

## 2023-05-25 DIAGNOSIS — I700 Atherosclerosis of aorta: Secondary | ICD-10-CM | POA: Diagnosis not present

## 2023-05-25 DIAGNOSIS — E559 Vitamin D deficiency, unspecified: Secondary | ICD-10-CM | POA: Diagnosis not present

## 2023-05-25 DIAGNOSIS — I5022 Chronic systolic (congestive) heart failure: Secondary | ICD-10-CM | POA: Diagnosis not present

## 2023-05-25 DIAGNOSIS — R2681 Unsteadiness on feet: Secondary | ICD-10-CM | POA: Diagnosis not present

## 2023-05-25 DIAGNOSIS — J439 Emphysema, unspecified: Secondary | ICD-10-CM | POA: Diagnosis not present

## 2023-05-25 DIAGNOSIS — Z1331 Encounter for screening for depression: Secondary | ICD-10-CM | POA: Diagnosis not present

## 2023-05-25 DIAGNOSIS — F322 Major depressive disorder, single episode, severe without psychotic features: Secondary | ICD-10-CM | POA: Diagnosis not present

## 2023-05-25 DIAGNOSIS — Z853 Personal history of malignant neoplasm of breast: Secondary | ICD-10-CM | POA: Diagnosis not present

## 2023-05-25 DIAGNOSIS — N1832 Chronic kidney disease, stage 3b: Secondary | ICD-10-CM | POA: Diagnosis not present

## 2023-05-25 DIAGNOSIS — E039 Hypothyroidism, unspecified: Secondary | ICD-10-CM | POA: Diagnosis not present

## 2023-06-08 ENCOUNTER — Other Ambulatory Visit: Payer: Self-pay | Admitting: Student

## 2023-06-19 ENCOUNTER — Ambulatory Visit: Payer: Medicare Other | Attending: Internal Medicine | Admitting: Internal Medicine

## 2023-06-19 ENCOUNTER — Encounter: Payer: Self-pay | Admitting: Internal Medicine

## 2023-06-19 VITALS — BP 125/68 | HR 68 | Ht 60.0 in | Wt 171.2 lb

## 2023-06-19 DIAGNOSIS — I4721 Torsades de pointes: Secondary | ICD-10-CM | POA: Diagnosis not present

## 2023-06-19 NOTE — Patient Instructions (Signed)
Medication Instructions:  Your physician recommends that you continue on your current medications as directed. Please refer to the Current Medication list given to you today.  *If you need a refill on your cardiac medications before your next appointment, please call your pharmacy*   Lab Work: None ordered.  If you have labs (blood work) drawn today and your tests are completely normal, you will receive your results only by: MyChart Message (if you have MyChart) OR A paper copy in the mail If you have any lab test that is abnormal or we need to change your treatment, we will call you to review the results.   Testing/Procedures: None ordered.    Follow-Up: At [facility], you and your health needs are our priority.  As part of our continuing mission to provide you with exceptional heart care, we have created designated Provider Care Teams.  These Care Teams include your primary Cardiologist (physician) and Advanced Practice Providers (APPs -  Physician Assistants and Nurse Practitioners) who all work together to provide you with the care you need, when you need it.  We recommend signing up for the patient portal called "MyChart".  Sign up information is provided on this After Visit Summary.  MyChart is used to connect with patients for Virtual Visits (Telemedicine).  Patients are able to view lab/test results, encounter notes, upcoming appointments, etc.  Non-urgent messages can be sent to your provider as well.   To learn more about what you can do with MyChart, go to [email or web address].    Your next appointment:   Follow up with Dr Klein as needed 

## 2023-06-19 NOTE — Progress Notes (Signed)
Patient Care Team: Thana Ates, MD as PCP - General (Internal Medicine) Jake Bathe, MD as PCP - Cardiology (Cardiology) Duke Salvia, MD as PCP - Electrophysiology (Cardiology) Charna Elizabeth, MD as Consulting Physician (Gastroenterology) Magrinat, Valentino Hue, MD (Inactive) as Consulting Physician (Oncology) Glenna Fellows, MD (Inactive) as Consulting Physician (General Surgery) Antony Blackbird, MD as Consulting Physician (Radiation Oncology) Axel Filler, Larna Daughters, NP as Nurse Practitioner (Hematology and Oncology) York Spaniel, MD (Inactive) as Consulting Physician (Neurology) Huston Foley, MD as Attending Physician (Neurology) Kalman Shan, MD as Consulting Physician (Pulmonary Disease)   HPI  Kimberly Waters is a 77 y.o. female Seen in follow-up for torsade de pointes that was ultimately hypothesized to be related to profound hypothyroidism and that turns out to have been a case of stopping thyroid replacement.  She was treated with both intravenous and oral thyroid replacement with improvement in QT intervals and elimination of torsade de pointes  DATE TEST EF   1/24 Echo   60-65 %              Doing well since she has come home.  The big issue has been a pain now in her left arm with some numbness upon awakening in the morning.  Been going on for couple of weeks.   Chronic mild dyspnea chronic mild edema   t  Records and Results Reviewed   Past Medical History:  Diagnosis Date   Allergy    Breast cancer ([facility])    Cataract    Colon polyps    Depression    Dyspnea    SEASONAL   Family history of breast cancer    Family history of colon cancer    GERD (gastroesophageal reflux disease)    History of radiation therapy 06/05/17-07/20/17   right chest wall 50.4 Gy in 28 fractions, right axillary nodal region 45 Gy in 25 fractions   Hyperlipidemia    Hypertension    Hypertensive retinopathy    Hypothyroidism    Macular degeneration     Peripheral neuropathy 06/18/2019   PONV (postoperative nausea and vomiting)     Past Surgical History:  Procedure Laterality Date   ABDOMINAL HYSTERECTOMY     KNEE SURGERY Right    LEFT HEART CATH AND CORONARY ANGIOGRAPHY N/A 02/02/2023   Procedure: LEFT HEART CATH AND CORONARY ANGIOGRAPHY;  Surgeon: Lennette Bihari, MD;  Location: [facility];  Service: Cardiovascular;  Laterality: N/A;   MASTECTOMY W/ SENTINEL NODE BIOPSY Right 03/05/2017   Procedure: BILATERAL TOTAL MASTECTOMIES WITH RIGHT SENTINEL LYMPH NODE BIOPSIES;  Surgeon: Glenna Fellows, MD;  Location: [facility];  Service: General;  Laterality: Right;   SHOULDER SURGERY     BILAT    Current Meds  Medication Sig   acetaminophen (TYLENOL) 325 MG tablet Take 2 tablets (650 mg total) by mouth every 4 (four) hours as needed for headache or mild pain.   albuterol (PROVENTIL,VENTOLIN) 90 MCG/ACT inhaler Inhale 2 puffs into the lungs every 4 (four) hours as needed. (Patient taking differently: Inhale 2 puffs into the lungs every 4 (four) hours as needed for wheezing or shortness of breath.)   amLODipine (NORVASC) 5 MG tablet Take 1 tablet (5 mg total) by mouth daily.   arformoterol (BROVANA) 15 MCG/2ML NEBU Take 2 mLs (15 mcg total) by nebulization 2 (two) times daily.   betamethasone dipropionate 0.05 % cream 1 application   buPROPion (WELLBUTRIN SR) 150 MG 12 hr tablet  Take 150 mg by mouth daily.    clobetasol ointment (TEMOVATE) 0.05 % as needed.   escitalopram (LEXAPRO) 10 MG tablet Take 1 tablet (10 mg total) by mouth daily.   ezetimibe (ZETIA) 10 MG tablet Take 1 tablet (10 mg total) by mouth daily.   furosemide (LASIX) 40 MG tablet Take 1 tablet (40 mg total) by mouth daily.   ipratropium (ATROVENT) 0.06 % nasal spray Place 2 sprays into the nose 4 (four) times daily.   ketoconazole (NIZORAL) 2 % cream Apply 1 application topically daily.   levocetirizine (XYZAL) 5 MG tablet Take 5 mg by mouth as needed for allergies.    levothyroxine (SYNTHROID) 112 MCG tablet Take 112 mcg by mouth daily before breakfast.   melatonin 5 MG TABS Take 1 tablet (5 mg total) by mouth at bedtime.   mometasone (ELOCON) 0.1 % ointment Apply topically as needed.   nitroGLYCERIN (NITROSTAT) 0.4 MG SL tablet Place 0.4 mg under the tongue every 5 (five) minutes as needed for chest pain.   omeprazole (PRILOSEC) 20 MG capsule Take 20 mg by mouth daily.   potassium chloride SA (KLOR-CON M) 20 MEQ tablet Take 2 tablets (40 mEq total) by mouth daily.   revefenacin (YUPELRI) 175 MCG/3ML nebulizer solution Take 3 mLs (175 mcg total) by nebulization daily.   rosuvastatin (CRESTOR) 20 MG tablet Take 1 tablet (20 mg total) by mouth daily.   spironolactone (ALDACTONE) 25 MG tablet Take 1 tablet (25 mg total) by mouth daily.   tacrolimus (PROTOPIC) 0.1 % ointment Apply topically as needed.    Allergies  Allergen Reactions   Latex Other (See Comments)    Other reaction(s): Other (See Comments) Tears skin.   Adhesive [Tape] Other (See Comments)    (skin tears)  Tolerates PAPER TAPE   Codeine Nausea Only   Other Nausea Only    Anesthesia--nausea       Review of Systems negative except from HPI and PMH  Physical Exam BP 125/68 (Patient Position: Standing)   Pulse 68   Ht 5' (1.524 m)   Wt 171 lb 3.2 oz (77.7 kg)   SpO2 95%   BMI 33.44 kg/m  Well developed and nourished in no acute distress HENT normal Neck supple with JVP-  flat  Clear Regular rate and rhythm, no murmurs or gallops Abd-soft with active BS No Clubbing cyanosis edema Skin-warm and dry A & Oriented  Grossly normal sensory and motor function  ECG sinus at 87 Intervals 15/12/40 with a QTc of 478 with correction (frederica 450)    CrCl cannot be calculated (Patient's most recent lab result is older than the maximum 21 days allowed.).   Assessment and  Plan Torsade de pointes-hypothyroidism induced-  Hypertension  Edema   Patient has a normal QTc at this  time with care of her thyroid.  We will see her again as needed      Current medicines are reviewed at length with the patient today .  The patient does not  have concerns regarding medicines.  Follow-up in a year

## 2023-06-26 ENCOUNTER — Encounter (INDEPENDENT_AMBULATORY_CARE_PROVIDER_SITE_OTHER): Payer: Self-pay | Admitting: Ophthalmology

## 2023-06-26 ENCOUNTER — Ambulatory Visit (INDEPENDENT_AMBULATORY_CARE_PROVIDER_SITE_OTHER): Payer: Medicare Other | Admitting: Ophthalmology

## 2023-06-26 DIAGNOSIS — H353211 Exudative age-related macular degeneration, right eye, with active choroidal neovascularization: Secondary | ICD-10-CM

## 2023-06-26 DIAGNOSIS — H353221 Exudative age-related macular degeneration, left eye, with active choroidal neovascularization: Secondary | ICD-10-CM

## 2023-06-26 DIAGNOSIS — H35033 Hypertensive retinopathy, bilateral: Secondary | ICD-10-CM

## 2023-06-26 DIAGNOSIS — I10 Essential (primary) hypertension: Secondary | ICD-10-CM

## 2023-06-26 DIAGNOSIS — H353231 Exudative age-related macular degeneration, bilateral, with active choroidal neovascularization: Secondary | ICD-10-CM

## 2023-06-26 DIAGNOSIS — H25813 Combined forms of age-related cataract, bilateral: Secondary | ICD-10-CM

## 2023-06-26 MED ORDER — AFLIBERCEPT 2MG/0.05ML IZ SOLN FOR KALEIDOSCOPE
2.0000 mg | INTRAVITREAL | Status: AC | PRN
  Administered 2023-06-26: 2 mg via INTRAVITREAL

## 2023-06-26 NOTE — Progress Notes (Signed)
[facility] - Clinic Note  06/26/2023     CHIEF COMPLAINT Patient presents for Retina Follow Up  HISTORY OF PRESENT ILLNESS: Kimberly Waters is a 77 y.o. female who presents to the clinic today for:  HPI     Retina Follow Up   Patient presents with  Wet AMD.  In both eyes.  This started 7 months ago.  Duration of 7 months.  Since onset it is stable.  I, the attending physician,  performed the HPI with the patient and updated documentation appropriately.        Comments   7 month retina follow up AMD OD and IVA OD pt is reporting that her vision has been cloudy she has appointment with Dr Zenaida Niece coming up for cataract eval she was in the hospital due to cardiac issues       Last edited by Rennis Chris, MD on 06/26/2023  5:20 PM.     Pt is delayed to follow up from 8 weeks to 7 months (12.19.23 to 07.02.24) due to being in the hospital with heart problems, pt states the morning of her last scheduled appt, her heart stopped, she states she "coded twice" but was brought back, she states all of this happened due to her thyroid medicine, pt states her vision is very bad right now, she is unable to drive or write, she went to see another eye dr for glasses, but they would not give her any until she got her retina in better shape, she cancelled her cataract sx and has not rescheduled  Referring physician: Thana Ates, MD [address] [place],  [place] [address]  HISTORICAL INFORMATION:   Selected notes from the MEDICAL RECORD NUMBER Referred by Dr. Conley Rolls for eval of decreased VA OD.   CURRENT MEDICATIONS: No current outpatient medications on file. (Ophthalmic Drugs)   No current facility-administered medications for this visit. (Ophthalmic Drugs)   Current Outpatient Medications (Other)  Medication Sig   acetaminophen (TYLENOL) 325 MG tablet Take 2 tablets (650 mg total) by mouth every 4 (four) hours as needed for headache or mild pain.   albuterol  (PROVENTIL,VENTOLIN) 90 MCG/ACT inhaler Inhale 2 puffs into the lungs every 4 (four) hours as needed. (Patient taking differently: Inhale 2 puffs into the lungs every 4 (four) hours as needed for wheezing or shortness of breath.)   amLODipine (NORVASC) 5 MG tablet Take 1 tablet (5 mg total) by mouth daily.   arformoterol (BROVANA) 15 MCG/2ML NEBU Take 2 mLs (15 mcg total) by nebulization 2 (two) times daily.   betamethasone dipropionate 0.05 % cream 1 application   buPROPion (WELLBUTRIN SR) 150 MG 12 hr tablet Take 150 mg by mouth daily.    clobetasol ointment (TEMOVATE) 0.05 % as needed.   escitalopram (LEXAPRO) 10 MG tablet Take 1 tablet (10 mg total) by mouth daily.   ezetimibe (ZETIA) 10 MG tablet Take 1 tablet (10 mg total) by mouth daily.   furosemide (LASIX) 40 MG tablet Take 1 tablet (40 mg total) by mouth daily.   ipratropium (ATROVENT) 0.06 % nasal spray Place 2 sprays into the nose 4 (four) times daily.   ketoconazole (NIZORAL) 2 % cream Apply 1 application topically daily.   levocetirizine (XYZAL) 5 MG tablet Take 5 mg by mouth as needed for allergies.   levothyroxine (SYNTHROID) 112 MCG tablet Take 112 mcg by mouth daily before breakfast.   melatonin 5 MG TABS Take 1 tablet (5 mg  total) by mouth at bedtime.   mometasone (ELOCON) 0.1 % ointment Apply topically as needed.   nitroGLYCERIN (NITROSTAT) 0.4 MG SL tablet Place 0.4 mg under the tongue every 5 (five) minutes as needed for chest pain.   omeprazole (PRILOSEC) 20 MG capsule Take 20 mg by mouth daily.   potassium chloride SA (KLOR-CON M) 20 MEQ tablet Take 2 tablets (40 mEq total) by mouth daily.   revefenacin (YUPELRI) 175 MCG/3ML nebulizer solution Take 3 mLs (175 mcg total) by nebulization daily.   rosuvastatin (CRESTOR) 20 MG tablet Take 1 tablet (20 mg total) by mouth daily.   spironolactone (ALDACTONE) 25 MG tablet Take 1 tablet (25 mg total) by mouth daily.   tacrolimus (PROTOPIC) 0.1 % ointment Apply topically as needed.    No current facility-administered medications for this visit. (Other)   REVIEW OF SYSTEMS: ROS   Positive for: Neurological, Eyes, Respiratory Negative for: Constitutional, Gastrointestinal, Skin, Genitourinary, Musculoskeletal, HENT, Endocrine, Cardiovascular, Psychiatric, Allergic/Imm, Heme/Lymph Last edited by Etheleen Mayhew, COT on 06/26/2023  1:38 PM.     ALLERGIES Allergies  Allergen Reactions   Latex Other (See Comments)    Other reaction(s): Other (See Comments) Tears skin.   Adhesive [Tape] Other (See Comments)    (skin tears)  Tolerates PAPER TAPE   Codeine Nausea Only   Other Nausea Only    Anesthesia--nausea    PAST MEDICAL HISTORY Past Medical History:  Diagnosis Date   Allergy    Breast cancer (HCC)    Cataract    Colon polyps    Depression    Dyspnea    SEASONAL   Family history of breast cancer    Family history of colon cancer    GERD (gastroesophageal reflux disease)    History of radiation therapy 06/05/17-07/20/17   right chest wall 50.4 Gy in 28 fractions, right axillary nodal region 45 Gy in 25 fractions   Hyperlipidemia    Hypertension    Hypertensive retinopathy    Hypothyroidism    Macular degeneration    Peripheral neuropathy 06/18/2019   PONV (postoperative nausea and vomiting)    Past Surgical History:  Procedure Laterality Date   ABDOMINAL HYSTERECTOMY     KNEE SURGERY Right    LEFT HEART CATH AND CORONARY ANGIOGRAPHY N/A 02/02/2023   Procedure: LEFT HEART CATH AND CORONARY ANGIOGRAPHY;  Surgeon: Lennette Bihari, MD;  Location: [facility] INVASIVE CV [facility];  Service: Cardiovascular;  Laterality: N/A;   MASTECTOMY W/ SENTINEL NODE BIOPSY Right 03/05/2017   Procedure: BILATERAL TOTAL MASTECTOMIES WITH RIGHT SENTINEL LYMPH NODE BIOPSIES;  Surgeon: Glenna Fellows, MD;  Location: [facility] OR;  Service: General;  Laterality: Right;   SHOULDER SURGERY     BILAT   FAMILY HISTORY Family History  Problem Relation Age of Onset   Heart disease  Mother 5       stents   Colon cancer Maternal Grandfather 59   Colon cancer Maternal Aunt 65   Colon cancer Maternal Uncle        dx in his 74s   Breast cancer Paternal Aunt 17   Breast cancer Cousin 53       paternal first cousin   Lung cancer Paternal Grandfather    Colon cancer Maternal Uncle    Head & neck cancer Maternal Uncle        oral cancer   SOCIAL HISTORY Social History   Tobacco Use   Smoking status: Former    Packs/day: 1.00    Years: 45.00  Additional pack years: 0.00    Total pack years: 45.00    Types: Cigarettes    Quit date: 02/22/2017    Years since quitting: 6.3   Smokeless tobacco: Never  Vaping Use   Vaping Use: Never used  Substance Use Topics   Alcohol use: No    Alcohol/week: 0.0 standard drinks of alcohol   Drug use: No       OPHTHALMIC EXAM: Base Eye Exam     Visual Acuity (Snellen - Linear)       Right Left   Dist cc 20/100 -2 CF at 3'   Dist ph cc  NI    Correction: Glasses         Tonometry (Tonopen, 1:41 PM)       Right Left   Pressure 16 16         Pupils       Pupils Dark Light Shape React APD   Right PERRL 3 2 Round Brisk None   Left PERRL 3 2 Round Brisk None         Visual Fields       Left Right    Full Full         Extraocular Movement       Right Left    Full, Ortho Full, Ortho         Neuro/Psych     Oriented x3: Yes   Mood/Affect: Normal         Dilation     Both eyes: 2.5% Phenylephrine @ 1:41 PM           Slit Lamp and Fundus Exam     Slit Lamp Exam       Right Left   Lids/Lashes Dermatochalasis - upper lid Dermatochalasis - upper lid   Conjunctiva/Sclera nasal and temporal pinguecula nasal and temporal pinguecula   Cornea arcus, tear film debris Trace Punctate epithelial erosions, arcus, tear film debris   Anterior Chamber deep, narrow temporal angle deep, narrow temporal angle   Iris Round and moderately dilated, mild anterior bowing Round and moderately dilated,  mild anterior bowing   Lens 3+ Nuclear sclerosis with early brunescence, 3+ Cortical cataract 3+ Nuclear sclerosis with early brunescence, 3+ Cortical cataract   Anterior Vitreous mild syneresis, Posterior vitreous detachment Vitreous syneresis, Posterior vitreous detachment         Fundus Exam       Right Left   Disc Pink and Sharp, Compact, mild PPA Mild Pallor, Sharp rim, +elevation   C/D Ratio 0.0 0.1   Macula Blunted foveal reflex, +CNV with pigment clumping/ring, central IRH, SRF and edema, RPE mottling and clumping, Drusen, no heme Blunted foveal reflex, large area of GA with disciform scar and subretinal fibrosis, central pigment clumping, no heme   Vessels attenuated, Tortuous attenuated, mild tortuosity, mild AV crossing changes   Periphery Attached, scattered reticular degeneration, paving stone degeneration Attached, scattered reticular degeneration, scattered paving stone degeneration           Refraction     Wearing Rx       Sphere Cylinder Axis   Right -6.50 +2.00 152   Left -5.25 +1.00 018    Type: SVL           IMAGING AND PROCEDURES  Imaging and Procedures for 06/26/2023  OCT, Retina - OU - Both Eyes       Right Eye Quality was borderline. Central Foveal Thickness: 504. Progression has worsened. Findings include abnormal foveal contour,  subretinal hyper-reflective material, choroidal neovascular membrane, intraretinal fluid, pigment epithelial detachment, subretinal fluid (Interval development of severe central SRHM and SRF ).   Left Eye Quality was good. Central Foveal Thickness: 300. Progression has been stable. Findings include no IRF, no SRF, abnormal foveal contour, subretinal hyper-reflective material, lamellar hole, outer retinal atrophy (large central disciform scar with ORA, Foveal notch, trace cystic changes and temporal edema -- all stable).   Notes *Images captured and stored on drive  Diagnosis / Impression:  exu ARMD OU OD: Interval  development of severe central SRHM and SRF  OS: large central disciform scar with ORA, Foveal notch, trace cystic changes and temporal edema -- all stable  Clinical management:  See below  Abbreviations: NFP - Normal foveal profile. CME - cystoid macular edema. PED - pigment epithelial detachment. IRF - intraretinal fluid. SRF - subretinal fluid. EZ - ellipsoid zone. ERM - epiretinal membrane. ORA - outer retinal atrophy. ORT - outer retinal tubulation. SRHM - subretinal hyper-reflective material. IRHM - intraretinal hyper-reflective material      Intravitreal Injection, Pharmacologic Agent - OD - Right Eye       Time Out 06/26/2023. 2:50 PM. Confirmed correct patient, procedure, site, and patient consented.   Anesthesia Topical anesthesia was used. Anesthetic medications included Lidocaine 2%, Proparacaine 0.5%.   Procedure Preparation included 5% betadine to ocular surface, eyelid speculum. A (32g) needle was used.   Injection: 2 mg aflibercept 2 MG/0.05ML   Route: Intravitreal, Site: Right Eye   NDC: L6038910, Lot: [id number], Expiration date: 06/23/2024, Waste: 0 mL   Post-op Post injection exam found visual acuity of at least counting fingers. The patient tolerated the procedure well. There were no complications. The patient received written and verbal post procedure care education. Post injection medications were not given.            ASSESSMENT/PLAN:   ICD-10-CM   1. Exudative age-related macular degeneration of right eye with active choroidal neovascularization (HCC)  H35.3211 OCT, Retina - OU - Both Eyes    Intravitreal Injection, Pharmacologic Agent - OD - Right Eye    aflibercept (EYLEA) SOLN 2 mg    2. Exudative age-related macular degeneration of left eye with active choroidal neovascularization (HCC)  H35.3221     3. Essential hypertension  I10     4. Hypertensive retinopathy of both eyes  H35.033     5. Combined forms of age-related cataract of both  eyes  H25.813      1. Exudative age related macular degeneration OD  - lost to follow up from 8 weeks to 7 months (12.19.23 to 07.03.24) due to cardiac issues -- vision decreased OD  - pt initially presented w/ 2 wk history of decreased vision OD  - history of intravitreal injections OS with Dr. Luciana Axe in 2017 and earlier -- pt reports stopping visits after the development of a corneal abrasion from a lid speculum.  - s/p IVA OD #1 (12.21.21), #2 (01.24.22), #3 (2.21.22), #4 (03.22.22), #5 (4.26.22), #6 (5.31.22), #7 (7.12.22), #8 (8.16.22), #9 (9.20.22), #10 (10.25.22), #11 (12.6.22), #12 (01.10.23), #13 (02.14.23), #14 (03.21.23), #15 (04.25.23), #16 06.06.23), #17 (08.01.23), #18 (09.12.23), #19 (10.31.23), #20 (12.19.23)  **history of increased SRF at 6 wk interval, noted on 12.6.22**  **history of increased SRF at 8 wk interval, noted on 08.01.23**  - OCT shows OD: Interval development of severe central SRHM and SRF   - BCVA OD 20/100 -- decreased from 20/50             -  Recommend switching to IVE OD #1 today, 7.02.24 w/ f/u back to 4 wks  - RBA of procedure discussed, questions answered  - IVE informed consent obtained and signed, 07.02.24 (OD)  - see procedure note  - f/u in 4 wks -- DFE/OCT, possible injection   2. Exudative age related macular degeneration, OS  - OS with history inactive disciform scar, but new focal IRH noted 5.31.22 -- stably resolved on exam today - s/p IVA OS #1 (05.31.22), #2 (07.12.22), #3 (8.15.22), #4 (9.20.22) for focal [facility]  - history of intravitreal injections OS with Dr. Luciana Axe in 2017 and earlier -- pt reports stopping visits after the development of a corneal abrasion from a lid speculum  - before being referred here, had not seen a retina specialist since 2017  - exam and OCT OS shows stable disciform scar -- no IRF/SRF  - BCVA CF OS -- stable - recommend holding IVA OS today - f/u 4 weeks, DFE, OCT  3,4. Hypertensive retinopathy OU - discussed  importance of tight BP control - monitor  5. Mixed Cataract OU - The symptoms of cataract, surgical options, and treatments and risks were discussed with patient. - discussed diagnosis and progression - approaching visual significance - had appt with Dr. Zenaida Niece, but is going to hold off on surgery for now  Ophthalmic Meds Ordered this visit:  Meds ordered this encounter  Medications   aflibercept (EYLEA) SOLN 2 mg     Return in about 4 weeks (around 07/24/2023) for f/u exu ARMD OU, DFE, OCT.  There are no Patient Instructions on file for this visit.  This document serves as a record of services personally performed by Karie Chimera, MD, PhD. It was created on their behalf by Gerilyn Nestle, COT an ophthalmic technician. The creation of this record is the provider's dictation and/or activities during the visit.    Electronically signed by:  Charlette Caffey, COT  06/26/23 5:22 PM  This document serves as a record of services personally performed by Karie Chimera, MD, PhD. It was created on their behalf by Glee Arvin. Manson Passey, OA an ophthalmic technician. The creation of this record is the provider's dictation and/or activities during the visit.    Electronically signed by: Glee Arvin. Manson Passey, OA 06/26/23 5:22 PM  Karie Chimera, M.D., Ph.D. Diseases & Surgery of the Retina and Vitreous [facility]  I have reviewed the above documentation for accuracy and completeness, and I agree with the above. Karie Chimera, M.D., Ph.D. 06/26/23 5:24 PM  Abbreviations: M myopia (nearsighted); A astigmatism; H hyperopia (farsighted); P presbyopia; Mrx spectacle prescription;  CTL contact lenses; OD right eye; OS left eye; OU both eyes  XT exotropia; ET esotropia; PEK punctate epithelial keratitis; PEE punctate epithelial erosions; DES dry eye syndrome; MGD meibomian gland dysfunction; ATs artificial tears; PFAT's preservative free artificial tears; NSC nuclear sclerotic  cataract; PSC posterior subcapsular cataract; ERM epi-retinal membrane; PVD posterior vitreous detachment; RD retinal detachment; DM diabetes mellitus; DR diabetic retinopathy; NPDR non-proliferative diabetic retinopathy; PDR proliferative diabetic retinopathy; CSME clinically significant macular edema; DME diabetic macular edema; dbh dot blot hemorrhages; CWS cotton wool spot; POAG primary open angle glaucoma; C/D cup-to-disc ratio; HVF humphrey visual field; GVF goldmann visual field; OCT optical coherence tomography; IOP intraocular pressure; BRVO Branch retinal vein occlusion; CRVO central retinal vein occlusion; CRAO central retinal artery occlusion; BRAO branch retinal artery occlusion; RT retinal tear; SB scleral buckle; PPV pars plana vitrectomy; VH Vitreous  hemorrhage; PRP panretinal laser photocoagulation; IVK intravitreal kenalog; VMT vitreomacular traction; MH Macular hole;  NVD neovascularization of the disc; NVE neovascularization elsewhere; AREDS age related eye disease study; ARMD age related macular degeneration; POAG primary open angle glaucoma; EBMD epithelial/anterior basement membrane dystrophy; ACIOL anterior chamber intraocular lens; IOL intraocular lens; PCIOL posterior chamber intraocular lens; Phaco/IOL phacoemulsification with intraocular lens placement; [place] photorefractive keratectomy; LASIK laser assisted in situ keratomileusis; HTN hypertension; DM diabetes mellitus; COPD chronic obstructive pulmonary disease

## 2023-07-12 NOTE — Progress Notes (Signed)
[facility] - Clinic Note  07/24/2023     CHIEF COMPLAINT Patient presents for Retina Follow Up  HISTORY OF PRESENT ILLNESS: Kimberly HARDEBECK is a 77 y.o. female who presents to the clinic today for:  HPI     Retina Follow Up   Patient presents with  Wet AMD.  In both eyes.  This started 4 weeks ago.  Duration of 4 weeks.  Since onset it is stable.  I, the attending physician,  performed the HPI with the patient and updated documentation appropriately.        Comments   4 week retina follow up and I'VE OD and ? OS pt is reporting no vision changes noticed she denies any flashes or floaters       Last edited by Rennis Chris, MD on 07/24/2023  3:23 PM.    Patient states that she has noticed an improvement in vision.   Referring physician: Thana Ates, MD [address] [place],  [place] [address]  HISTORICAL INFORMATION:   Selected notes from the MEDICAL RECORD NUMBER Referred by Dr. Conley Rolls for eval of decreased VA OD.   CURRENT MEDICATIONS: No current outpatient medications on file. (Ophthalmic Drugs)   No current facility-administered medications for this visit. (Ophthalmic Drugs)   Current Outpatient Medications (Other)  Medication Sig   acetaminophen (TYLENOL) 325 MG tablet Take 2 tablets (650 mg total) by mouth every 4 (four) hours as needed for headache or mild pain.   albuterol (PROVENTIL,VENTOLIN) 90 MCG/ACT inhaler Inhale 2 puffs into the lungs every 4 (four) hours as needed. (Patient taking differently: Inhale 2 puffs into the lungs every 4 (four) hours as needed for wheezing or shortness of breath.)   amLODipine (NORVASC) 5 MG tablet Take 1 tablet (5 mg total) by mouth daily.   arformoterol (BROVANA) 15 MCG/2ML NEBU Take 2 mLs (15 mcg total) by nebulization 2 (two) times daily.   betamethasone dipropionate 0.05 % cream 1 application   buPROPion (WELLBUTRIN SR) 150 MG 12 hr tablet Take 150 mg by mouth daily.    clobetasol ointment  (TEMOVATE) 0.05 % as needed.   escitalopram (LEXAPRO) 10 MG tablet Take 1 tablet (10 mg total) by mouth daily.   ezetimibe (ZETIA) 10 MG tablet Take 1 tablet (10 mg total) by mouth daily.   furosemide (LASIX) 40 MG tablet Take 1 tablet (40 mg total) by mouth daily.   ipratropium (ATROVENT) 0.06 % nasal spray Place 2 sprays into the nose 4 (four) times daily.   ketoconazole (NIZORAL) 2 % cream Apply 1 application topically daily.   levocetirizine (XYZAL) 5 MG tablet Take 5 mg by mouth as needed for allergies.   levothyroxine (SYNTHROID) 112 MCG tablet Take 112 mcg by mouth daily before breakfast.   melatonin 5 MG TABS Take 1 tablet (5 mg total) by mouth at bedtime.   mometasone (ELOCON) 0.1 % ointment Apply topically as needed.   nitroGLYCERIN (NITROSTAT) 0.4 MG SL tablet Place 0.4 mg under the tongue every 5 (five) minutes as needed for chest pain.   omeprazole (PRILOSEC) 20 MG capsule Take 20 mg by mouth daily.   potassium chloride SA (KLOR-CON M) 20 MEQ tablet Take 2 tablets (40 mEq total) by mouth daily.   revefenacin (YUPELRI) 175 MCG/3ML nebulizer solution Take 3 mLs (175 mcg total) by nebulization daily.   rosuvastatin (CRESTOR) 20 MG tablet Take 1 tablet (20 mg total) by mouth daily.   spironolactone (ALDACTONE) 25 MG  tablet Take 1 tablet (25 mg total) by mouth daily.   tacrolimus (PROTOPIC) 0.1 % ointment Apply topically as needed.   No current facility-administered medications for this visit. (Other)   REVIEW OF SYSTEMS: ROS   Positive for: Neurological, Eyes, Respiratory Negative for: Constitutional, Gastrointestinal, Skin, Genitourinary, Musculoskeletal, HENT, Endocrine, Cardiovascular, Psychiatric, Allergic/Imm, Heme/Lymph Last edited by Etheleen Mayhew, COT on 07/24/2023  1:04 PM.      ALLERGIES Allergies  Allergen Reactions   Latex Other (See Comments)    Other reaction(s): Other (See Comments) Tears skin.   Adhesive [Tape] Other (See Comments)    (skin tears)   Tolerates PAPER TAPE   Codeine Nausea Only   Other Nausea Only    Anesthesia--nausea    PAST MEDICAL HISTORY Past Medical History:  Diagnosis Date   Allergy    Breast cancer (HCC)    Cataract    Colon polyps    Depression    Dyspnea    SEASONAL   Family history of breast cancer    Family history of colon cancer    GERD (gastroesophageal reflux disease)    History of radiation therapy 06/05/17-07/20/17   right chest wall 50.4 Gy in 28 fractions, right axillary nodal region 45 Gy in 25 fractions   Hyperlipidemia    Hypertension    Hypertensive retinopathy    Hypothyroidism    Macular degeneration    Peripheral neuropathy 06/18/2019   PONV (postoperative nausea and vomiting)    Past Surgical History:  Procedure Laterality Date   ABDOMINAL HYSTERECTOMY     KNEE SURGERY Right    LEFT HEART CATH AND CORONARY ANGIOGRAPHY N/A 02/02/2023   Procedure: LEFT HEART CATH AND CORONARY ANGIOGRAPHY;  Surgeon: Lennette Bihari, MD;  Location: [facility] INVASIVE CV [facility];  Service: Cardiovascular;  Laterality: N/A;   MASTECTOMY W/ SENTINEL NODE BIOPSY Right 03/05/2017   Procedure: BILATERAL TOTAL MASTECTOMIES WITH RIGHT SENTINEL LYMPH NODE BIOPSIES;  Surgeon: Glenna Fellows, MD;  Location: [facility] OR;  Service: General;  Laterality: Right;   SHOULDER SURGERY     BILAT   FAMILY HISTORY Family History  Problem Relation Age of Onset   Heart disease Mother 13       stents   Colon cancer Maternal Grandfather 59   Colon cancer Maternal Aunt 65   Colon cancer Maternal Uncle        dx in his 43s   Breast cancer Paternal Aunt 40   Breast cancer Cousin 41       paternal first cousin   Lung cancer Paternal Grandfather    Colon cancer Maternal Uncle    Head & neck cancer Maternal Uncle        oral cancer   SOCIAL HISTORY Social History   Tobacco Use   Smoking status: Former    Current packs/day: 0.00    Average packs/day: 1 pack/day for 45.0 years (45.0 ttl pk-yrs)    Types: Cigarettes    Start  date: 02/23/1972    Quit date: 02/22/2017    Years since quitting: 6.4   Smokeless tobacco: Never  Vaping Use   Vaping status: Never Used  Substance Use Topics   Alcohol use: No    Alcohol/week: 0.0 standard drinks of alcohol   Drug use: No       OPHTHALMIC EXAM: Base Eye Exam     Visual Acuity (Snellen - Linear)       Right Left   Dist cc 20/150 CF at 3'   Dist ph  cc NI     Correction: Glasses         Tonometry (Tonopen, 1:07 PM)       Right Left   Pressure 17 18         Pupils       Pupils Dark Light Shape React APD   Right PERRL 3 2 Round Brisk None   Left PERRL 3 2 Round Brisk None         Visual Fields       Left Right    Full Full         Extraocular Movement       Right Left    Full, Ortho Full, Ortho         Neuro/Psych     Oriented x3: Yes   Mood/Affect: Normal         Dilation     Both eyes: 2.5% Phenylephrine @ 1:08 PM           Slit Lamp and Fundus Exam     Slit Lamp Exam       Right Left   Lids/Lashes Dermatochalasis - upper lid Dermatochalasis - upper lid   Conjunctiva/Sclera nasal and temporal pinguecula nasal and temporal pinguecula   Cornea arcus, tear film debris Trace Punctate epithelial erosions, arcus, tear film debris   Anterior Chamber deep, narrow temporal angle deep, narrow temporal angle   Iris Round and moderately dilated, mild anterior bowing Round and moderately dilated, mild anterior bowing   Lens 3+ Nuclear sclerosis with early brunescence, 3+ Cortical cataract 3+ Nuclear sclerosis with early brunescence, 3+ Cortical cataract   Anterior Vitreous mild syneresis, Posterior vitreous detachment Vitreous syneresis, Posterior vitreous detachment         Fundus Exam       Right Left   Disc Pink and Sharp, Compact, mild PPA Mild Pallor, Sharp rim, +elevation   C/D Ratio 0.0 0.1   Macula Blunted foveal reflex, +CNV with pigment clumping/ring, central IRH, SRF and edema--improved, RPE mottling and  clumping, Drusen, punctate heme on pigment ring Blunted foveal reflex, large area of GA with disciform scar and subretinal fibrosis, central pigment clumping, no heme   Vessels attenuated, Tortuous attenuated, mild tortuosity, mild AV crossing changes   Periphery Attached, scattered reticular degeneration, paving stone degeneration, no heme Attached, scattered reticular degeneration, scattered paving stone degeneration           Refraction     Wearing Rx       Sphere Cylinder Axis   Right -6.50 +2.00 152   Left -5.25 +1.00 018    Type: SVL           IMAGING AND PROCEDURES  Imaging and Procedures for 07/24/2023  OCT, Retina - OU - Both Eyes       Right Eye Quality was borderline. Central Foveal Thickness: 395. Progression has improved. Findings include no IRF, abnormal foveal contour, subretinal hyper-reflective material, choroidal neovascular membrane, intraretinal fluid, pigment epithelial detachment, subretinal fluid (Interval improvement in severe central SRHM and SRF ).   Left Eye Quality was good. Central Foveal Thickness: 277. Progression has been stable. Findings include no IRF, no SRF, abnormal foveal contour, subretinal hyper-reflective material, lamellar hole, outer retinal atrophy (large central disciform scar with ORA, Foveal notch, trace cystic changes and temporal edema -- all stable).   Notes *Images captured and stored on drive  Diagnosis / Impression:  exu ARMD OU OD: Interval improvement in severe central SRHM and SRF  OS: large  central disciform scar with ORA, Foveal notch, trace cystic changes and temporal edema -- all stable  Clinical management:  See below  Abbreviations: NFP - Normal foveal profile. CME - cystoid macular edema. PED - pigment epithelial detachment. IRF - intraretinal fluid. SRF - subretinal fluid. EZ - ellipsoid zone. ERM - epiretinal membrane. ORA - outer retinal atrophy. ORT - outer retinal tubulation. SRHM - subretinal  hyper-reflective material. IRHM - intraretinal hyper-reflective material      Intravitreal Injection, Pharmacologic Agent - OD - Right Eye       Time Out 07/24/2023. 1:26 PM. Confirmed correct patient, procedure, site, and patient consented.   Anesthesia Topical anesthesia was used. Anesthetic medications included Lidocaine 2%, Proparacaine 0.5%.   Procedure Preparation included 5% betadine to ocular surface, eyelid speculum. A (32g) needle was used.   Injection: 2 mg aflibercept 2 MG/0.05ML   Route: Intravitreal, Site: Right Eye   NDC: L6038910, Lot: [id number], Expiration date: 08/24/2024, Waste: 0 mL   Post-op Post injection exam found visual acuity of at least counting fingers. The patient tolerated the procedure well. There were no complications. The patient received written and verbal post procedure care education. Post injection medications were not given.            ASSESSMENT/PLAN:   ICD-10-CM   1. Exudative age-related macular degeneration of right eye with active choroidal neovascularization (HCC)  H35.3211 OCT, Retina - OU - Both Eyes    Intravitreal Injection, Pharmacologic Agent - OD - Right Eye    aflibercept (EYLEA) SOLN 2 mg    2. Exudative age-related macular degeneration of left eye with active choroidal neovascularization (HCC)  H35.3221     3. Essential hypertension  I10     4. Hypertensive retinopathy of both eyes  H35.033     5. Combined forms of age-related cataract of both eyes  H25.813      1. Exudative age related macular degeneration OD - lost to follow up from 8 weeks to 7 months (12.19.23 to 07.02.24) due to cardiac issues -- vision decreased to 20/100 OD on 07.02.24  - pt initially presented w/ 2 wk history of decreased vision OD (12.21.2021) - history of intravitreal injections OS with Dr. Luciana Axe in 2017 and earlier -- pt reports stopping visits after the development of a corneal abrasion from a lid speculum. - s/p IVA OD #1  (12.21.21), #2 (01.24.22), #3 (2.21.22), #4 (03.22.22), #5 (4.26.22), #6 (5.31.22), #7 (7.12.22), #8 (8.16.22), #9 (9.20.22), #10 (10.25.22), #11 (12.6.22), #12 (01.10.23), #13 (02.14.23), #14 (03.21.23), #15 (04.25.23), #16 06.06.23), #17 (08.01.23), #18 (09.12.23), #19 (10.31.23), #20 (12.19.23)  - s/p IVE OD #1 (07.02.24)  **history of increased SRF at 6 wk interval, noted on 12.6.22**  **history of increased SRF at 8 wk interval, noted on 08.01.23**  - OCT shows OD: Interval improvement in severe central SRHM and SRF at 4 wks  - BCVA OD 20/150 - decreased             - Recommend IVE OD #2 today, 7.30.24 w/ f/u in 4 wks  - RBA of procedure discussed, questions answered  - IVE informed consent obtained and signed, 07.02.24 (OD)  - see procedure note  - f/u in 4 wks -- DFE/OCT, possible injection   2. Exudative age related macular degeneration, OS - OS with history inactive disciform scar, but new focal IRH noted 5.31.22 -- stably resolved on exam today - s/p IVA OS #1 (05.31.22), #2 (07.12.22), #3 (8.15.22), #4 (9.20.22) for focal IRH -  history of intravitreal injections OS with Dr. Luciana Axe in 2017 and earlier -- pt reports stopping visits after the development of a corneal abrasion from a lid speculum  - before being referred here, had not seen a retina specialist since 2017  - exam and OCT OS shows stable disciform scar -- no IRF/SRF  - BCVA CF OS -- stable - recommend holding IVA OS today - f/u 4 weeks, DFE, OCT  3,4. Hypertensive retinopathy OU - discussed importance of tight BP control - monitor  5. Mixed Cataract OU - The symptoms of cataract, surgical options, and treatments and risks were discussed with patient. - discussed diagnosis and progression - approaching visual significance - had appt with Dr. Zenaida Niece, but is going to hold off on surgery for now  Ophthalmic Meds Ordered this visit:  Meds ordered this encounter  Medications   aflibercept (EYLEA) SOLN 2 mg     Return  in about 4 weeks (around 08/21/2023) for f/u Ex. AMD OD , DFE, OCT, Possible, IVE, OD.  There are no Patient Instructions on file for this visit.  This document serves as a record of services personally performed by Karie Chimera, MD, PhD. It was created on their behalf by Gerilyn Nestle, COT an ophthalmic technician. The creation of this record is the provider's dictation and/or activities during the visit.    Electronically signed by:  Charlette Caffey, COT  07/24/23 3:36 PM  Karie Chimera, M.D., Ph.D. Diseases & Surgery of the Retina and Vitreous [facility]  I have reviewed the above documentation for accuracy and completeness, and I agree with the above. Karie Chimera, M.D., Ph.D. 07/24/23 3:38 PM   Abbreviations: M myopia (nearsighted); A astigmatism; H hyperopia (farsighted); P presbyopia; Mrx spectacle prescription;  CTL contact lenses; OD right eye; OS left eye; OU both eyes  XT exotropia; ET esotropia; PEK punctate epithelial keratitis; PEE punctate epithelial erosions; DES dry eye syndrome; MGD meibomian gland dysfunction; ATs artificial tears; PFAT's preservative free artificial tears; NSC nuclear sclerotic cataract; PSC posterior subcapsular cataract; ERM epi-retinal membrane; PVD posterior vitreous detachment; RD retinal detachment; DM diabetes mellitus; DR diabetic retinopathy; NPDR non-proliferative diabetic retinopathy; PDR proliferative diabetic retinopathy; CSME clinically significant macular edema; DME diabetic macular edema; dbh dot blot hemorrhages; CWS cotton wool spot; POAG primary open angle glaucoma; C/D cup-to-disc ratio; HVF humphrey visual field; GVF goldmann visual field; OCT optical coherence tomography; IOP intraocular pressure; BRVO Branch retinal vein occlusion; CRVO central retinal vein occlusion; CRAO central retinal artery occlusion; BRAO branch retinal artery occlusion; RT retinal tear; SB scleral buckle; PPV pars plana  vitrectomy; VH Vitreous hemorrhage; PRP panretinal laser photocoagulation; IVK intravitreal kenalog; VMT vitreomacular traction; MH Macular hole;  NVD neovascularization of the disc; NVE neovascularization elsewhere; AREDS age related eye disease study; ARMD age related macular degeneration; POAG primary open angle glaucoma; EBMD epithelial/anterior basement membrane dystrophy; ACIOL anterior chamber intraocular lens; IOL intraocular lens; PCIOL posterior chamber intraocular lens; Phaco/IOL phacoemulsification with intraocular lens placement; PRK photorefractive keratectomy; LASIK laser assisted in situ keratomileusis; HTN hypertension; DM diabetes mellitus; COPD chronic obstructive pulmonary disease

## 2023-07-24 ENCOUNTER — Encounter (INDEPENDENT_AMBULATORY_CARE_PROVIDER_SITE_OTHER): Payer: Self-pay | Admitting: Ophthalmology

## 2023-07-24 ENCOUNTER — Ambulatory Visit (INDEPENDENT_AMBULATORY_CARE_PROVIDER_SITE_OTHER): Payer: Medicare Other | Admitting: Ophthalmology

## 2023-07-24 DIAGNOSIS — H353231 Exudative age-related macular degeneration, bilateral, with active choroidal neovascularization: Secondary | ICD-10-CM

## 2023-07-24 DIAGNOSIS — I10 Essential (primary) hypertension: Secondary | ICD-10-CM

## 2023-07-24 DIAGNOSIS — H35033 Hypertensive retinopathy, bilateral: Secondary | ICD-10-CM

## 2023-07-24 DIAGNOSIS — H353221 Exudative age-related macular degeneration, left eye, with active choroidal neovascularization: Secondary | ICD-10-CM

## 2023-07-24 DIAGNOSIS — H25813 Combined forms of age-related cataract, bilateral: Secondary | ICD-10-CM

## 2023-07-24 DIAGNOSIS — H353211 Exudative age-related macular degeneration, right eye, with active choroidal neovascularization: Secondary | ICD-10-CM

## 2023-07-24 MED ORDER — AFLIBERCEPT 2MG/0.05ML IZ SOLN FOR KALEIDOSCOPE
2.0000 mg | INTRAVITREAL | Status: AC | PRN
  Administered 2023-07-24: 2 mg via INTRAVITREAL

## 2023-07-30 DIAGNOSIS — E039 Hypothyroidism, unspecified: Secondary | ICD-10-CM | POA: Diagnosis not present

## 2023-08-01 DIAGNOSIS — H25811 Combined forms of age-related cataract, right eye: Secondary | ICD-10-CM | POA: Diagnosis not present

## 2023-08-01 DIAGNOSIS — H25813 Combined forms of age-related cataract, bilateral: Secondary | ICD-10-CM | POA: Diagnosis not present

## 2023-08-07 NOTE — Progress Notes (Signed)
[facility] - Clinic Note  08/21/2023     CHIEF COMPLAINT Patient presents for Retina Follow Up  HISTORY OF PRESENT ILLNESS: Kimberly Waters is a 77 y.o. female who presents to the clinic today for:  HPI     Retina Follow Up   Patient presents with  Wet AMD.  In right eye.  This started 4 weeks ago.  Duration of 4 weeks.  Since onset it is stable.  I, the attending physician,  performed the HPI with the patient and updated documentation appropriately.        Comments   4 week retina follow up AMD and I'VE OD pt is reporting no vision changes noticed she denies any flashes or floaters       Last edited by Rennis Chris, MD on 08/21/2023  4:39 PM.    Patient feels the vision is a little better after the last injection.   Referring physician: Thana Ates, MD [address] [place],  [place] [address]  HISTORICAL INFORMATION:   Selected notes from the MEDICAL RECORD NUMBER Referred by Dr. Conley Rolls for eval of decreased VA OD.   CURRENT MEDICATIONS: No current outpatient medications on file. (Ophthalmic Drugs)   No current facility-administered medications for this visit. (Ophthalmic Drugs)   Current Outpatient Medications (Other)  Medication Sig   acetaminophen (TYLENOL) 325 MG tablet Take 2 tablets (650 mg total) by mouth every 4 (four) hours as needed for headache or mild pain.   albuterol (PROVENTIL,VENTOLIN) 90 MCG/ACT inhaler Inhale 2 puffs into the lungs every 4 (four) hours as needed. (Patient taking differently: Inhale 2 puffs into the lungs every 4 (four) hours as needed for wheezing or shortness of breath.)   amLODipine (NORVASC) 5 MG tablet Take 1 tablet (5 mg total) by mouth daily.   arformoterol (BROVANA) 15 MCG/2ML NEBU Take 2 mLs (15 mcg total) by nebulization 2 (two) times daily.   betamethasone dipropionate 0.05 % cream 1 application   buPROPion (WELLBUTRIN SR) 150 MG 12 hr tablet Take 150 mg by mouth daily.    clobetasol ointment  (TEMOVATE) 0.05 % as needed.   escitalopram (LEXAPRO) 10 MG tablet Take 1 tablet (10 mg total) by mouth daily.   ezetimibe (ZETIA) 10 MG tablet Take 1 tablet (10 mg total) by mouth daily.   furosemide (LASIX) 40 MG tablet Take 1 tablet (40 mg total) by mouth daily.   ipratropium (ATROVENT) 0.06 % nasal spray Place 2 sprays into the nose 4 (four) times daily.   ketoconazole (NIZORAL) 2 % cream Apply 1 application topically daily.   levocetirizine (XYZAL) 5 MG tablet Take 5 mg by mouth as needed for allergies.   levothyroxine (SYNTHROID) 112 MCG tablet Take 112 mcg by mouth daily before breakfast.   melatonin 5 MG TABS Take 1 tablet (5 mg total) by mouth at bedtime.   mometasone (ELOCON) 0.1 % ointment Apply topically as needed.   nitroGLYCERIN (NITROSTAT) 0.4 MG SL tablet Place 0.4 mg under the tongue every 5 (five) minutes as needed for chest pain.   omeprazole (PRILOSEC) 20 MG capsule Take 20 mg by mouth daily.   potassium chloride SA (KLOR-CON M) 20 MEQ tablet Take 2 tablets (40 mEq total) by mouth daily.   revefenacin (YUPELRI) 175 MCG/3ML nebulizer solution Take 3 mLs (175 mcg total) by nebulization daily.   rosuvastatin (CRESTOR) 20 MG tablet Take 1 tablet (20 mg total) by mouth daily.   spironolactone (ALDACTONE) 25 MG  tablet Take 1 tablet (25 mg total) by mouth daily.   tacrolimus (PROTOPIC) 0.1 % ointment Apply topically as needed.   No current facility-administered medications for this visit. (Other)   REVIEW OF SYSTEMS: ROS   Positive for: Neurological, Eyes, Respiratory Negative for: Constitutional, Gastrointestinal, Skin, Genitourinary, Musculoskeletal, HENT, Endocrine, Cardiovascular, Psychiatric, Allergic/Imm, Heme/Lymph Last edited by Etheleen Mayhew, COT on 08/21/2023  1:19 PM.       ALLERGIES Allergies  Allergen Reactions   Latex Other (See Comments)    Other reaction(s): Other (See Comments) Tears skin.   Adhesive [Tape] Other (See Comments)    (skin tears)   Tolerates PAPER TAPE   Codeine Nausea Only   Other Nausea Only    Anesthesia--nausea    PAST MEDICAL HISTORY Past Medical History:  Diagnosis Date   Allergy    Breast cancer (HCC)    Cataract    Colon polyps    Depression    Dyspnea    SEASONAL   Family history of breast cancer    Family history of colon cancer    GERD (gastroesophageal reflux disease)    History of radiation therapy 06/05/17-07/20/17   right chest wall 50.4 Gy in 28 fractions, right axillary nodal region 45 Gy in 25 fractions   Hyperlipidemia    Hypertension    Hypertensive retinopathy    Hypothyroidism    Macular degeneration    Peripheral neuropathy 06/18/2019   PONV (postoperative nausea and vomiting)    Past Surgical History:  Procedure Laterality Date   ABDOMINAL HYSTERECTOMY     KNEE SURGERY Right    LEFT HEART CATH AND CORONARY ANGIOGRAPHY N/A 02/02/2023   Procedure: LEFT HEART CATH AND CORONARY ANGIOGRAPHY;  Surgeon: Lennette Bihari, MD;  Location: [facility] IN[facility]ASIVE [facility];  Service: Cardiovascular;  Laterality: N/A;   MASTECTOMY W/ SENTINEL NODE BIOPSY Right 03/05/2017   Procedure: BILATERAL TOTAL MASTECTOMIES WITH RIGHT SENTINEL LYMPH NODE BIOPSIES;  Surgeon: Glenna Fellows, MD;  Location: [facility] OR;  Service: General;  Laterality: Right;   SHOULDER SURGERY     BILAT   FAMILY HISTORY Family History  Problem Relation Age of Onset   Heart disease Mother 66       stents   Colon cancer Maternal Grandfather 59   Colon cancer Maternal Aunt 65   Colon cancer Maternal Uncle        dx in his 27s   Breast cancer Paternal Aunt 74   Breast cancer Cousin 13       paternal first cousin   Lung cancer Paternal Grandfather    Colon cancer Maternal Uncle    Head & neck cancer Maternal Uncle        oral cancer   SOCIAL HISTORY Social History   Tobacco Use   Smoking status: Former    Current packs/day: 0.00    Average packs/day: 1 pack/day for 45.0 years (45.0 ttl pk-yrs)    Types: Cigarettes    Start  date: 02/23/1972    Quit date: 02/22/2017    Years since quitting: 6.4   Smokeless tobacco: Never  Vaping Use   Vaping status: Never Used  Substance Use Topics   Alcohol use: No    Alcohol/week: 0.0 standard drinks of alcohol   Drug use: No       OPHTHALMIC EXAM: Base Eye Exam     Visual Acuity (Snellen - Linear)       Right Left   Dist cc 20/150 -2 CF at 3'  Dist ph cc NI NI    Correction: Glasses         Tonometry (Tonopen, 1:22 PM)       Right Left   Pressure 20 19         Pupils       Pupils Dark Light Shape React APD   Right PERRL 3 2 Round Brisk None   Left PERRL 3 2 Round Brisk None         Visual Fields       Left Right    Full Full         Neuro/Psych     Oriented x3: Yes   Mood/Affect: Normal         Dilation     Both eyes: 2.5% Phenylephrine @ 1:22 PM           Slit Lamp and Fundus Exam     Slit Lamp Exam       Right Left   Lids/Lashes Dermatochalasis - upper lid Dermatochalasis - upper lid   Conjunctiva/Sclera nasal and temporal pinguecula nasal and temporal pinguecula   Cornea arcus, tear film debris Trace Punctate epithelial erosions, arcus, tear film debris   Anterior Chamber deep, narrow temporal angle deep, narrow temporal angle   Iris Round and moderately dilated, mild anterior bowing Round and moderately dilated, mild anterior bowing   Lens 3+ Nuclear sclerosis with early brunescence, 3+ Cortical cataract 3+ Nuclear sclerosis with early brunescence, 3+ Cortical cataract   Anterior Vitreous mild syneresis, Posterior vitreous detachment Vitreous syneresis, Posterior vitreous detachment         Fundus Exam       Right Left   Disc Pink and Sharp, Compact, mild PPA Mild Pallor, Sharp rim, +elevation   C/D Ratio 0.0 0.1   Macula Blunted foveal reflex, +CNV with pigment clumping/ring, central IRH- stably improved, central SRF and edema --slightly increased, RPE mottling and clumping, Drusen, punctate heme on pigment  ring--improved, no heme Blunted foveal reflex, large area of GA with disciform scar and subretinal fibrosis, central pigment clumping, no heme   Vessels attenuated, Tortuous attenuated, mild tortuosity, mild AV crossing changes   Periphery Attached, scattered reticular degeneration, paving stone degeneration, no heme Attached, scattered reticular degeneration, scattered paving stone degeneration           Refraction     Wearing Rx       Sphere Cylinder Axis   Right -6.50 +2.00 152   Left -5.25 +1.00 018    Type: SVL           IMAGING AND PROCEDURES  Imaging and Procedures for 08/21/2023  OCT, Retina - OU - Both Eyes       Right Eye Quality was borderline. Central Foveal Thickness: 366. Progression has worsened. Findings include no IRF, abnormal foveal contour, subretinal hyper-reflective material, choroidal neovascular membrane, intraretinal fluid, pigment epithelial detachment, subretinal fluid (Interval improvement in central SRHM and mild interval increase in overlying IRF/SRF).   Left Eye Quality was good. Central Foveal Thickness: 275. Progression has been stable. Findings include no IRF, no SRF, abnormal foveal contour, subretinal hyper-reflective material, lamellar hole, outer retinal atrophy (large central disciform scar with ORA, Foveal notch, trace cystic changes and temporal edema -- all stable).   Notes *Images captured and stored on drive  Diagnosis / Impression:  exu ARMD OU OD: Interval improvement in central SRHM and mild interval increase in overlying IRF/SRF OS: large central disciform scar with ORA, Foveal notch, trace cystic changes  and temporal edema -- all stable  Clinical management:  See below  Abbreviations: NFP - Normal foveal profile. CME - cystoid macular edema. PED - pigment epithelial detachment. IRF - intraretinal fluid. SRF - subretinal fluid. EZ - ellipsoid zone. ERM - epiretinal membrane. ORA - outer retinal atrophy. ORT - outer retinal  tubulation. SRHM - subretinal hyper-reflective material. IRHM - intraretinal hyper-reflective material      Intravitreal Injection, Pharmacologic Agent - OD - Right Eye       Time Out 08/21/2023. 1:59 PM. Confirmed correct patient, procedure, site, and patient consented.   Anesthesia Topical anesthesia was used. Anesthetic medications included Lidocaine 2%, Proparacaine 0.5%.   Procedure Preparation included 5% betadine to ocular surface, eyelid speculum. A (32g) needle was used.   Injection: 2 mg aflibercept 2 MG/0.05ML   Route: Intravitreal, Site: Right Eye   NDC: L6038910, Lot: [id number], Expiration date: 11/23/2024, Waste: 0 mL   Post-op Post injection exam found visual acuity of at least counting fingers. The patient tolerated the procedure well. There were no complications. The patient received written and verbal post procedure care education. Post injection medications were not given.            ASSESSMENT/PLAN:   ICD-10-CM   1. Exudative age-related macular degeneration of right eye with active choroidal neovascularization (HCC)  H35.3211 OCT, Retina - OU - Both Eyes    Intravitreal Injection, Pharmacologic Agent - OD - Right Eye    aflibercept (EYLEA) SOLN 2 mg    2. Exudative age-related macular degeneration of left eye with active choroidal neovascularization (HCC)  H35.3221     3. Essential hypertension  I10     4. Hypertensive retinopathy of both eyes  H35.033     5. Combined forms of age-related cataract of both eyes  H25.813      1. Exudative age related macular degeneration OD - lost to follow up from 8 weeks to 7 months (12.19.23 to 07.02.24) due to cardiac issues -- vision decreased to 20/100 OD on 07.02.24  - pt initially presented w/ 2 wk history of decreased vision OD (12.21.2021) - history of intravitreal injections OS with Dr. Luciana Axe in 2017 and earlier -- pt reports stopping visits after the development of a corneal abrasion from a lid  speculum. - s/p IVA OD #1 (12.21.21), #2 (01.24.22), #3 (2.21.22), #4 (03.22.22), #5 (4.26.22), #6 (5.31.22), #7 (7.12.22), #8 (8.16.22), #9 (9.20.22), #10 (10.25.22), #11 (12.6.22), #12 (01.10.23), #13 (02.14.23), #14 (03.21.23), #15 (04.25.23), #16 06.06.23), #17 (08.01.23), #18 (09.12.23), #19 (10.31.23), #20 (12.19.23)  - s/p IVE OD #1 (07.02.24), #2 (07.30.24)  **history of increased SRF at 6 wk interval, noted on 12.6.22**  **history of increased SRF at 8 wk interval, noted on 08.01.23** - OCT shows OD: Interval improvement in central SRHM and mild interval increase in overlying IRF/SRF at 4 wks  - BCVA OD 20/150 - stable             - Recommend IVE OD #3 today, 8.27.24 w/ f/u in 4 wks  - RBA of procedure discussed, questions answered  - IVE informed consent obtained and signed, 07.02.24 (OD)  - see procedure note  - f/u in 4 wks -- DFE/OCT, possible injection   2. Exudative age related macular degeneration, OS - OS with history inactive disciform scar, but new focal IRH noted 5.31.22 -- stably resolved on exam today - s/p IVA OS #1 (05.31.22), #2 (07.12.22), #3 (8.15.22), #4 (9.20.22) for focal [facility] - history of intravitreal injections  OS with Dr. Luciana Axe in 2017 and earlier -- pt reports stopping visits after the development of a corneal abrasion from a lid speculum  - before being referred here, had not seen a retina specialist since 2017  - exam and OCT OS shows stable disciform scar -- no IRF/SRF  - BCVA CF OS -- stable - recommend holding IVA OS today - f/u 4 weeks, DFE, OCT  3,4. Hypertensive retinopathy OU - discussed importance of tight BP control - monitor  5. Mixed Cataract OU - The symptoms of cataract, surgical options, and treatments and risks were discussed with patient. - discussed diagnosis and progression - approaching visual significance - had appt with Dr. Zenaida Niece, scheduled for surgery 09/2023  Ophthalmic Meds Ordered this visit:  Meds ordered this encounter   Medications   aflibercept (EYLEA) SOLN 2 mg     Return in about 4 weeks (around 09/18/2023) for f/u Ex. AMD OU, DFE, OCT, Possible, IVE, OD.  There are no Patient Instructions on file for this visit.  This document serves as a record of services personally performed by Karie Chimera, MD, PhD. It was created on their behalf by Laurey Morale, COT an ophthalmic technician. The creation of this record is the provider's dictation and/or activities during the visit.    Electronically signed by:  Charlette Caffey, COT  08/21/23 4:42 PM  Karie Chimera, M.D., Ph.D. Diseases & Surgery of the Retina and Vitreous [facility]  I have reviewed the above documentation for accuracy and completeness, and I agree with the above. Karie Chimera, M.D., Ph.D. 08/21/23 4:42 PM   Abbreviations: M myopia (nearsighted); A astigmatism; H hyperopia (farsighted); P presbyopia; Mrx spectacle prescription;  CTL contact lenses; OD right eye; OS left eye; OU both eyes  XT exotropia; ET esotropia; PEK punctate epithelial keratitis; PEE punctate epithelial erosions; DES dry eye syndrome; MGD meibomian gland dysfunction; ATs artificial tears; PFAT's preservative free artificial tears; NSC nuclear sclerotic cataract; PSC posterior subcapsular cataract; ERM epi-retinal membrane; PVD posterior vitreous detachment; RD retinal detachment; DM diabetes mellitus; DR diabetic retinopathy; NPDR non-proliferative diabetic retinopathy; PDR proliferative diabetic retinopathy; CSME clinically significant macular edema; DME diabetic macular edema; dbh dot blot hemorrhages; CWS cotton wool spot; POAG primary open angle glaucoma; C/D cup-to-disc ratio; HVF humphrey visual field; GVF goldmann visual field; OCT optical coherence tomography; IOP intraocular pressure; BRVO Branch retinal vein occlusion; CRVO central retinal vein occlusion; CRAO central retinal artery occlusion; BRAO branch retinal artery occlusion; RT  retinal tear; SB scleral buckle; PPV pars plana vitrectomy; VH Vitreous hemorrhage; PRP panretinal laser photocoagulation; IVK intravitreal kenalog; VMT vitreomacular traction; MH Macular hole;  NVD neovascularization of the disc; NVE neovascularization elsewhere; AREDS age related eye disease study; ARMD age related macular degeneration; POAG primary open angle glaucoma; EBMD epithelial/anterior basement membrane dystrophy; ACIOL anterior chamber intraocular lens; IOL intraocular lens; PCIOL posterior chamber intraocular lens; Phaco/IOL phacoemulsification with intraocular lens placement; PRK photorefractive keratectomy; LASIK laser assisted in situ keratomileusis; HTN hypertension; DM diabetes mellitus; COPD chronic obstructive pulmonary disease

## 2023-08-21 ENCOUNTER — Encounter (INDEPENDENT_AMBULATORY_CARE_PROVIDER_SITE_OTHER): Payer: Self-pay | Admitting: Ophthalmology

## 2023-08-21 ENCOUNTER — Ambulatory Visit (INDEPENDENT_AMBULATORY_CARE_PROVIDER_SITE_OTHER): Payer: Medicare Other | Admitting: Ophthalmology

## 2023-08-21 DIAGNOSIS — H353231 Exudative age-related macular degeneration, bilateral, with active choroidal neovascularization: Secondary | ICD-10-CM | POA: Diagnosis not present

## 2023-08-21 DIAGNOSIS — H353221 Exudative age-related macular degeneration, left eye, with active choroidal neovascularization: Secondary | ICD-10-CM

## 2023-08-21 DIAGNOSIS — I10 Essential (primary) hypertension: Secondary | ICD-10-CM

## 2023-08-21 DIAGNOSIS — H35033 Hypertensive retinopathy, bilateral: Secondary | ICD-10-CM

## 2023-08-21 DIAGNOSIS — H25813 Combined forms of age-related cataract, bilateral: Secondary | ICD-10-CM

## 2023-08-21 DIAGNOSIS — H353211 Exudative age-related macular degeneration, right eye, with active choroidal neovascularization: Secondary | ICD-10-CM

## 2023-08-21 MED ORDER — AFLIBERCEPT 2MG/0.05ML IZ SOLN FOR KALEIDOSCOPE
2.0000 mg | INTRAVITREAL | Status: AC | PRN
  Administered 2023-08-21: 2 mg via INTRAVITREAL

## 2023-09-02 ENCOUNTER — Other Ambulatory Visit: Payer: Self-pay | Admitting: Student

## 2023-09-17 NOTE — Progress Notes (Signed)
[facility] - Clinic Note  09/19/2023     CHIEF COMPLAINT Patient presents for Retina Follow Up  HISTORY OF PRESENT ILLNESS: Kimberly Waters is a 77 y.o. female who presents to the clinic today for:  HPI     Retina Follow Up   Patient presents with  Wet AMD.  In both eyes.  This started 4 weeks ago.  Duration of 4 weeks.  Since onset it is gradually worsening.  I, the attending physician,  performed the HPI with the patient and updated documentation appropriately.        Comments   4 week retina follow up ARMD OU and I'VE OD pt is reporting vision seems little worse she denies any flashes or floaters pt is having cataract surgery OD by Dr Zenaida Niece the end of October       Last edited by Rennis Chris, MD on 09/19/2023  3:13 PM.      Referring physician: Thana Ates, MD [address] [place],  [place] [address]  HISTORICAL INFORMATION:   Selected notes from the MEDICAL RECORD NUMBER Referred by Dr. Conley Rolls for eval of decreased VA OD.   CURRENT MEDICATIONS: No current outpatient medications on file. (Ophthalmic Drugs)   No current facility-administered medications for this visit. (Ophthalmic Drugs)   Current Outpatient Medications (Other)  Medication Sig   acetaminophen (TYLENOL) 325 MG tablet Take 2 tablets (650 mg total) by mouth every 4 (four) hours as needed for headache or mild pain.   albuterol (PROVENTIL,VENTOLIN) 90 MCG/ACT inhaler Inhale 2 puffs into the lungs every 4 (four) hours as needed. (Patient taking differently: Inhale 2 puffs into the lungs every 4 (four) hours as needed for wheezing or shortness of breath.)   amLODipine (NORVASC) 5 MG tablet Take 1 tablet (5 mg total) by mouth daily.   arformoterol (BROVANA) 15 MCG/2ML NEBU Take 2 mLs (15 mcg total) by nebulization 2 (two) times daily.   betamethasone dipropionate 0.05 % cream 1 application   buPROPion (WELLBUTRIN SR) 150 MG 12 hr tablet Take 150 mg by mouth daily.    clobetasol  ointment (TEMOVATE) 0.05 % as needed.   escitalopram (LEXAPRO) 10 MG tablet Take 1 tablet (10 mg total) by mouth daily.   ezetimibe (ZETIA) 10 MG tablet Take 1 tablet (10 mg total) by mouth daily.   furosemide (LASIX) 40 MG tablet Take 1 tablet (40 mg total) by mouth daily.   ipratropium (ATROVENT) 0.06 % nasal spray Place 2 sprays into the nose 4 (four) times daily.   ketoconazole (NIZORAL) 2 % cream Apply 1 application topically daily.   levocetirizine (XYZAL) 5 MG tablet Take 5 mg by mouth as needed for allergies.   levothyroxine (SYNTHROID) 112 MCG tablet Take 112 mcg by mouth daily before breakfast.   melatonin 5 MG TABS Take 1 tablet (5 mg total) by mouth at bedtime.   mometasone (ELOCON) 0.1 % ointment Apply topically as needed.   nitroGLYCERIN (NITROSTAT) 0.4 MG SL tablet Place 0.4 mg under the tongue every 5 (five) minutes as needed for chest pain.   omeprazole (PRILOSEC) 20 MG capsule Take 20 mg by mouth daily.   potassium chloride SA (KLOR-CON M) 20 MEQ tablet Take 2 tablets (40 mEq total) by mouth daily.   revefenacin (YUPELRI) 175 MCG/3ML nebulizer solution Take 3 mLs (175 mcg total) by nebulization daily.   rosuvastatin (CRESTOR) 20 MG tablet Take 1 tablet (20 mg total) by mouth daily.   spironolactone (  ALDACTONE) 25 MG tablet TAKE 1 TABLET BY MOUTH EVERY DAY   tacrolimus (PROTOPIC) 0.1 % ointment Apply topically as needed.   No current facility-administered medications for this visit. (Other)   REVIEW OF SYSTEMS: ROS   Positive for: Neurological, Eyes, Respiratory Negative for: Constitutional, Gastrointestinal, Skin, Genitourinary, Musculoskeletal, HENT, Endocrine, Cardiovascular, Psychiatric, Allergic/Imm, Heme/Lymph Last edited by Etheleen Mayhew, COT on 09/19/2023  1:13 PM.        ALLERGIES Allergies  Allergen Reactions   Latex Other (See Comments)    Other reaction(s): Other (See Comments) Tears skin.   Adhesive [Tape] Other (See Comments)    (skin tears)   Tolerates PAPER TAPE   Codeine Nausea Only   Other Nausea Only    Anesthesia--nausea    PAST MEDICAL HISTORY Past Medical History:  Diagnosis Date   Allergy    Breast cancer (HCC)    Cataract    Colon polyps    Depression    Dyspnea    SEASONAL   Family history of breast cancer    Family history of colon cancer    GERD (gastroesophageal reflux disease)    History of radiation therapy 06/05/17-07/20/17   right chest wall 50.4 Gy in 28 fractions, right axillary nodal region 45 Gy in 25 fractions   Hyperlipidemia    Hypertension    Hypertensive retinopathy    Hypothyroidism    Macular degeneration    Peripheral neuropathy 06/18/2019   PONV (postoperative nausea and vomiting)    Past Surgical History:  Procedure Laterality Date   ABDOMINAL HYSTERECTOMY     KNEE SURGERY Right    LEFT HEART CATH AND CORONARY ANGIOGRAPHY N/A 02/02/2023   Procedure: LEFT HEART CATH AND CORONARY ANGIOGRAPHY;  Surgeon: Lennette Bihari, MD;  Location: [facility] INVASIVE CV [facility];  Service: Cardiovascular;  Laterality: N/A;   MASTECTOMY W/ SENTINEL NODE BIOPSY Right 03/05/2017   Procedure: BILATERAL TOTAL MASTECTOMIES WITH RIGHT SENTINEL LYMPH NODE BIOPSIES;  Surgeon: Glenna Fellows, MD;  Location: [facility] OR;  Service: General;  Laterality: Right;   SHOULDER SURGERY     BILAT   FAMILY HISTORY Family History  Problem Relation Age of Onset   Heart disease Mother 32       stents   Colon cancer Maternal Grandfather 59   Colon cancer Maternal Aunt 65   Colon cancer Maternal Uncle        dx in his 64s   Breast cancer Paternal Aunt 81   Breast cancer Cousin 38       paternal first cousin   Lung cancer Paternal Grandfather    Colon cancer Maternal Uncle    Head & neck cancer Maternal Uncle        oral cancer   SOCIAL HISTORY Social History   Tobacco Use   Smoking status: Former    Current packs/day: 0.00    Average packs/day: 1 pack/day for 45.0 years (45.0 ttl pk-yrs)    Types: Cigarettes    Start  date: 02/23/1972    Quit date: 02/22/2017    Years since quitting: 6.5   Smokeless tobacco: Never  Vaping Use   Vaping status: Never Used  Substance Use Topics   Alcohol use: No    Alcohol/week: 0.0 standard drinks of alcohol   Drug use: No       OPHTHALMIC EXAM: Base Eye Exam     Visual Acuity (Snellen - Linear)       Right Left   Dist cc 20/150 -2 CF at 3'  Dist ph cc NI NI         Tonometry (Tonopen, 1:17 PM)       Right Left   Pressure 15 18         Pupils       Pupils Dark Light Shape React APD   Right PERRL 3 2 Round Brisk None   Left PERRL 3 2 Round Brisk None         Visual Fields       Left Right    Full Full         Extraocular Movement       Right Left    Full, Ortho Full, Ortho         Neuro/Psych     Oriented x3: Yes   Mood/Affect: Normal         Dilation     Both eyes: 2.5% Phenylephrine @ 1:17 PM           Slit Lamp and Fundus Exam     Slit Lamp Exam       Right Left   Lids/Lashes Dermatochalasis - upper lid Dermatochalasis - upper lid   Conjunctiva/Sclera nasal and temporal pinguecula nasal and temporal pinguecula   Cornea arcus, tear film debris Trace Punctate epithelial erosions, arcus, tear film debris   Anterior Chamber deep, narrow temporal angle deep, narrow temporal angle   Iris Round and moderately dilated, mild anterior bowing Round and moderately dilated, mild anterior bowing   Lens 3+ Nuclear sclerosis with early brunescence, 3+ Cortical cataract 3+ Nuclear sclerosis with early brunescence, 3+ Cortical cataract   Anterior Vitreous mild syneresis, Posterior vitreous detachment Vitreous syneresis, Posterior vitreous detachment         Fundus Exam       Right Left   Disc Pink and Sharp, Compact, mild PPA Mild Pallor, Sharp rim, +elevation   C/D Ratio 0.0 0.1   Macula Blunted foveal reflex, +CNV with pigment clumping/ring, central IRH- stably improved, central SRF and edema --improved, RPE mottling and  clumping, Drusen, punctate heme on pigment ring--improved, no heme, + subretinal fibrosis Blunted foveal reflex, large area of GA with disciform scar and subretinal fibrosis, central pigment clumping, no heme   Vessels attenuated, Tortuous attenuated, mild tortuosity, mild AV crossing changes   Periphery Attached, scattered reticular degeneration, paving stone degeneration, no heme Attached, scattered reticular degeneration, scattered paving stone degeneration           Refraction     Wearing Rx       Sphere Cylinder Axis   Right -6.50 +2.00 152   Left -5.25 +1.00 018    Type: SVL           IMAGING AND PROCEDURES  Imaging and Procedures for 09/19/2023  OCT, Retina - OU - Both Eyes       Right Eye Quality was borderline. Central Foveal Thickness: 328. Progression has improved. Findings include no IRF, no SRF, abnormal foveal contour, subretinal hyper-reflective material, choroidal neovascular membrane, intraretinal fluid, pigment epithelial detachment, subretinal fluid (Interval improvement in central [facility], IRF/SRF).   Left Eye Quality was good. Central Foveal Thickness: 237. Progression has been stable. Findings include no IRF, no SRF, abnormal foveal contour, subretinal hyper-reflective material, lamellar hole, outer retinal atrophy (large central disciform scar with ORA, Foveal notch, trace cystic changes and temporal edema -- all stable).   Notes *Images captured and stored on drive  Diagnosis / Impression:  exu ARMD OU OD: Interval improvement in central [facility], IRF/SRF  OS: large central disciform scar with ORA, Foveal notch, trace cystic changes and temporal edema -- all stable  Clinical management:  See below  Abbreviations: NFP - Normal foveal profile. CME - cystoid macular edema. PED - pigment epithelial detachment. IRF - intraretinal fluid. SRF - subretinal fluid. EZ - ellipsoid zone. ERM - epiretinal membrane. ORA - outer retinal atrophy. ORT - outer retinal  tubulation. SRHM - subretinal hyper-reflective material. IRHM - intraretinal hyper-reflective material      Intravitreal Injection, Pharmacologic Agent - OD - Right Eye       Time Out 09/19/2023. 2:04 PM. Confirmed correct patient, procedure, site, and patient consented.   Anesthesia Topical anesthesia was used. Anesthetic medications included Lidocaine 2%, Proparacaine 0.5%.   Procedure Preparation included 5% betadine to ocular surface, eyelid speculum. A (32g) needle was used.   Injection: 2 mg aflibercept 2 MG/0.05ML   Route: Intravitreal, Site: Right Eye   NDC: L6038910, Lot: [id number], Expiration date: 10/24/2024, Waste: 0 mL   Post-op Post injection exam found visual acuity of at least counting fingers. The patient tolerated the procedure well. There were no complications. The patient received written and verbal post procedure care education. Post injection medications were not given.             ASSESSMENT/PLAN:   ICD-10-CM   1. Exudative age-related macular degeneration of right eye with active choroidal neovascularization (HCC)  H35.3211 OCT, Retina - OU - Both Eyes    Intravitreal Injection, Pharmacologic Agent - OD - Right Eye    aflibercept (EYLEA) SOLN 2 mg    2. Exudative age-related macular degeneration of left eye with active choroidal neovascularization (HCC)  H35.3221     3. Essential hypertension  I10     4. Hypertensive retinopathy of both eyes  H35.033     5. Combined forms of age-related cataract of both eyes  H25.813      1. Exudative age related macular degeneration OD - lost to follow up from 8 weeks to 7 months (12.19.23 to 07.02.24) due to cardiac issues -- vision decreased to 20/100 OD on 07.02.24  - pt initially presented w/ 2 wk history of decreased vision OD (12.21.2021) - history of intravitreal injections OS with Dr. Luciana Axe in 2017 and earlier -- pt reports stopping visits after the development of a corneal abrasion from a lid  speculum. - s/p IVA OD #1 (12.21.21), #2 (01.24.22), #3 (2.21.22), #4 (03.22.22), #5 (4.26.22), #6 (5.31.22), #7 (7.12.22), #8 (8.16.22), #9 (9.20.22), #10 (10.25.22), #11 (12.6.22), #12 (01.10.23), #13 (02.14.23), #14 (03.21.23), #15 (04.25.23), #16 06.06.23), #17 (08.01.23), #18 (09.12.23), #19 (10.31.23), #20 (12.19.23)  - s/p IVE OD #1 (07.02.24), #2 (07.30.24),#3 (08.27.24)  **history of increased SRF at 6 wk interval, noted on 12.6.22**  **history of increased SRF at 8 wk interval, noted on 08.01.23** - OCT shows OD: Interval improvement in central SRHM, IRF/SRF at 4 wks  - BCVA OD 20/150 - stable             - Recommend IVE OD #4 today, 9.25.24 w/ f/u in 4 wks  - RBA of procedure discussed, questions answered  - IVE informed consent obtained and signed, 07.02.24 (OD)  - see procedure note  - f/u in 4 wks -- DFE/OCT, possible injection   2. Exudative age related macular degeneration, OS - OS with history inactive disciform scar, but new focal IRH noted 5.31.22 -- stably resolved on exam today - s/p IVA OS #1 (05.31.22), #2 (07.12.22), #3 (8.15.22), #4 (9.20.22)  for focal [facility] - history of intravitreal injections OS with Dr. Luciana Axe in 2017 and earlier -- pt reports stopping visits after the development of a corneal abrasion from a lid speculum  - before being referred here, had not seen a retina specialist since 2017  - exam and OCT OS shows stable disciform scar -- no IRF/SRF  - BCVA CF OS -- stable - recommend holding IVA OS today - f/u 4 weeks, DFE, OCT  3,4. Hypertensive retinopathy OU - discussed importance of tight BP control - monitor  5. Mixed Cataract OU - The symptoms of cataract, surgical options, and treatments and risks were discussed with patient. - discussed diagnosis and progression - under the expert management of Dr. Zenaida Niece - surgery scheduled for 09/2023  Ophthalmic Meds Ordered this visit:  Meds ordered this encounter  Medications   aflibercept (EYLEA) SOLN 2  mg     Return in about 4 weeks (around 10/17/2023) for f/u Ex. AMD OD, DFE, OCT, Possible, IVE, OD.  There are no Patient Instructions on file for this visit.  This document serves as a record of services personally performed by Karie Chimera, MD, PhD. It was created on their behalf by De Blanch, an ophthalmic technician. The creation of this record is the provider's dictation and/or activities during the visit.    Electronically signed by: De Blanch, OA, 09/19/23  3:27 PM  This document serves as a record of services personally performed by Karie Chimera, MD, PhD. It was created on their behalf by Charlette Caffey, COT an ophthalmic technician. The creation of this record is the provider's dictation and/or activities during the visit.    Electronically signed by:  Charlette Caffey, COT  09/19/23 3:27 PM  Karie Chimera, M.D., Ph.D. Diseases & Surgery of the Retina and Vitreous [facility]  I have reviewed the above documentation for accuracy and completeness, and I agree with the above. Karie Chimera, M.D., Ph.D. 09/19/23 3:29 PM   Abbreviations: M myopia (nearsighted); A astigmatism; H hyperopia (farsighted); P presbyopia; Mrx spectacle prescription;  CTL contact lenses; OD right eye; OS left eye; OU both eyes  XT exotropia; ET esotropia; PEK punctate epithelial keratitis; PEE punctate epithelial erosions; DES dry eye syndrome; MGD meibomian gland dysfunction; ATs artificial tears; PFAT's preservative free artificial tears; NSC nuclear sclerotic cataract; PSC posterior subcapsular cataract; ERM epi-retinal membrane; PVD posterior vitreous detachment; RD retinal detachment; DM diabetes mellitus; DR diabetic retinopathy; NPDR non-proliferative diabetic retinopathy; PDR proliferative diabetic retinopathy; CSME clinically significant macular edema; DME diabetic macular edema; dbh dot blot hemorrhages; CWS cotton wool spot; POAG primary open angle  glaucoma; C/D cup-to-disc ratio; HVF humphrey visual field; GVF goldmann visual field; OCT optical coherence tomography; IOP intraocular pressure; BRVO Branch retinal vein occlusion; CRVO central retinal vein occlusion; CRAO central retinal artery occlusion; BRAO branch retinal artery occlusion; RT retinal tear; SB scleral buckle; PPV pars plana vitrectomy; VH Vitreous hemorrhage; PRP panretinal laser photocoagulation; IVK intravitreal kenalog; VMT vitreomacular traction; MH Macular hole;  NVD neovascularization of the disc; NVE neovascularization elsewhere; AREDS age related eye disease study; ARMD age related macular degeneration; POAG primary open angle glaucoma; EBMD epithelial/anterior basement membrane dystrophy; ACIOL anterior chamber intraocular lens; IOL intraocular lens; PCIOL posterior chamber intraocular lens; Phaco/IOL phacoemulsification with intraocular lens placement; PRK photorefractive keratectomy; LASIK laser assisted in situ keratomileusis; HTN hypertension; DM diabetes mellitus; COPD chronic obstructive pulmonary disease

## 2023-09-19 ENCOUNTER — Encounter (INDEPENDENT_AMBULATORY_CARE_PROVIDER_SITE_OTHER): Payer: Self-pay | Admitting: Ophthalmology

## 2023-09-19 ENCOUNTER — Ambulatory Visit (INDEPENDENT_AMBULATORY_CARE_PROVIDER_SITE_OTHER): Payer: Medicare Other | Admitting: Ophthalmology

## 2023-09-19 ENCOUNTER — Other Ambulatory Visit: Payer: Self-pay | Admitting: Student

## 2023-09-19 DIAGNOSIS — H353211 Exudative age-related macular degeneration, right eye, with active choroidal neovascularization: Secondary | ICD-10-CM

## 2023-09-19 DIAGNOSIS — H25813 Combined forms of age-related cataract, bilateral: Secondary | ICD-10-CM | POA: Diagnosis not present

## 2023-09-19 DIAGNOSIS — H353231 Exudative age-related macular degeneration, bilateral, with active choroidal neovascularization: Secondary | ICD-10-CM

## 2023-09-19 DIAGNOSIS — H35033 Hypertensive retinopathy, bilateral: Secondary | ICD-10-CM | POA: Diagnosis not present

## 2023-09-19 DIAGNOSIS — I10 Essential (primary) hypertension: Secondary | ICD-10-CM

## 2023-09-19 DIAGNOSIS — H353221 Exudative age-related macular degeneration, left eye, with active choroidal neovascularization: Secondary | ICD-10-CM

## 2023-09-19 MED ORDER — AFLIBERCEPT 2MG/0.05ML IZ SOLN FOR KALEIDOSCOPE
2.0000 mg | INTRAVITREAL | Status: AC | PRN
Start: 2023-09-19 — End: 2023-09-19
  Administered 2023-09-19: 2 mg via INTRAVITREAL

## 2023-09-20 MED ORDER — POTASSIUM CHLORIDE CRYS ER 20 MEQ PO TBCR: 40.0000 meq | EXTENDED_RELEASE_TABLET | Freq: Every day | ORAL | 8 refills | Status: DC

## 2023-10-02 DIAGNOSIS — E039 Hypothyroidism, unspecified: Secondary | ICD-10-CM | POA: Diagnosis not present

## 2023-10-15 NOTE — Progress Notes (Shared)
[facility] - Clinic Note  10/17/2023     CHIEF COMPLAINT Patient presents for Retina Follow Up  HISTORY OF PRESENT ILLNESS: Kimberly Waters is a 77 y.o. female who presents to the clinic today for:  HPI     Retina Follow Up   Patient presents with  Wet AMD.  In both eyes.  This started 4 weeks ago.  Duration of 4 weeks.  Since onset it is gradually worsening.  I, the attending physician,  performed the HPI with the patient and updated documentation appropriately.        Comments   Patient feels the vision is better. She is not using eye drops. She is having cataract surgery Wednesday 10.30.24 with Dr. Zenaida Niece.       Last edited by Rennis Chris, MD on 10/17/2023  2:54 PM.    Pt is having cataract sx OD with Dr. Zenaida Niece next Wednesday   Referring physician: Diona Foley, MD [address] [place],  [place] [address]  HISTORICAL INFORMATION:   Selected notes from the MEDICAL RECORD NUMBER Referred by Dr. Conley Rolls for eval of decreased VA OD.   CURRENT MEDICATIONS: No current outpatient medications on file. (Ophthalmic Drugs)   No current facility-administered medications for this visit. (Ophthalmic Drugs)   Current Outpatient Medications (Other)  Medication Sig   acetaminophen (TYLENOL) 325 MG tablet Take 2 tablets (650 mg total) by mouth every 4 (four) hours as needed for headache or mild pain.   albuterol (PROVENTIL,VENTOLIN) 90 MCG/ACT inhaler Inhale 2 puffs into the lungs every 4 (four) hours as needed. (Patient taking differently: Inhale 2 puffs into the lungs every 4 (four) hours as needed for wheezing or shortness of breath.)   amLODipine (NORVASC) 5 MG tablet Take 1 tablet (5 mg total) by mouth daily.   arformoterol (BROVANA) 15 MCG/2ML NEBU Take 2 mLs (15 mcg total) by nebulization 2 (two) times daily.   betamethasone dipropionate 0.05 % cream 1 application   buPROPion (WELLBUTRIN SR) 150 MG 12 hr tablet Take 150 mg by mouth daily.    clobetasol  ointment (TEMOVATE) 0.05 % as needed.   escitalopram (LEXAPRO) 10 MG tablet Take 1 tablet (10 mg total) by mouth daily.   ezetimibe (ZETIA) 10 MG tablet Take 1 tablet (10 mg total) by mouth daily.   furosemide (LASIX) 40 MG tablet Take 1 tablet (40 mg total) by mouth daily.   ipratropium (ATROVENT) 0.06 % nasal spray Place 2 sprays into the nose 4 (four) times daily.   ketoconazole (NIZORAL) 2 % cream Apply 1 application topically daily.   levocetirizine (XYZAL) 5 MG tablet Take 5 mg by mouth as needed for allergies.   levothyroxine (SYNTHROID) 112 MCG tablet Take 112 mcg by mouth daily before breakfast.   melatonin 5 MG TABS Take 1 tablet (5 mg total) by mouth at bedtime.   mometasone (ELOCON) 0.1 % ointment Apply topically as needed.   nitroGLYCERIN (NITROSTAT) 0.4 MG SL tablet Place 0.4 mg under the tongue every 5 (five) minutes as needed for chest pain.   omeprazole (PRILOSEC) 20 MG capsule Take 20 mg by mouth daily.   potassium chloride SA (KLOR-CON M) 20 MEQ tablet Take 2 tablets (40 mEq total) by mouth daily.   revefenacin (YUPELRI) 175 MCG/3ML nebulizer solution Take 3 mLs (175 mcg total) by nebulization daily.   rosuvastatin (CRESTOR) 20 MG tablet Take 1 tablet (20 mg total) by mouth daily.   spironolactone (ALDACTONE) 25 MG tablet TAKE 1  TABLET BY MOUTH EVERY DAY   tacrolimus (PROTOPIC) 0.1 % ointment Apply topically as needed.   No current facility-administered medications for this visit. (Other)   REVIEW OF SYSTEMS:      ALLERGIES Allergies  Allergen Reactions   Latex Other (See Comments)    Other reaction(s): Other (See Comments) Tears skin.   Adhesive [Tape] Other (See Comments)    (skin tears)  Tolerates PAPER TAPE   Codeine Nausea Only   Other Nausea Only    Anesthesia--nausea    PAST MEDICAL HISTORY Past Medical History:  Diagnosis Date   Allergy    Breast cancer (HCC)    Cataract    Colon polyps    Depression    Dyspnea    SEASONAL   Family history  of breast cancer    Family history of colon cancer    GERD (gastroesophageal reflux disease)    History of radiation therapy 06/05/17-07/20/17   right chest wall 50.4 Gy in 28 fractions, right axillary nodal region 45 Gy in 25 fractions   Hyperlipidemia    Hypertension    Hypertensive retinopathy    Hypothyroidism    Macular degeneration    Peripheral neuropathy 06/18/2019   PONV (postoperative nausea and vomiting)    Past Surgical History:  Procedure Laterality Date   ABDOMINAL HYSTERECTOMY     KNEE SURGERY Right    LEFT HEART CATH AND CORONARY ANGIOGRAPHY N/A 02/02/2023   Procedure: LEFT HEART CATH AND CORONARY ANGIOGRAPHY;  Surgeon: Lennette Bihari, MD;  Location: [facility] INVASIVE [facility];  Service: Cardiovascular;  Laterality: N/A;   MASTECTOMY W/ SENTINEL NODE BIOPSY Right 03/05/2017   Procedure: BILATERAL TOTAL MASTECTOMIES WITH RIGHT SENTINEL LYMPH NODE BIOPSIES;  Surgeon: Glenna Fellows, MD;  Location: [facility] OR;  Service: General;  Laterality: Right;   SHOULDER SURGERY     BILAT   FAMILY HISTORY Family History  Problem Relation Age of Onset   Heart disease Mother 88       stents   Colon cancer Maternal Grandfather 59   Colon cancer Maternal Aunt 65   Colon cancer Maternal Uncle        dx in his 76s   Breast cancer Paternal Aunt 64   Breast cancer Cousin 13       paternal first cousin   Lung cancer Paternal Grandfather    Colon cancer Maternal Uncle    Head & neck cancer Maternal Uncle        oral cancer   SOCIAL HISTORY Social History   Tobacco Use   Smoking status: Former    Current packs/day: 0.00    Average packs/day: 1 pack/day for 45.0 years (45.0 ttl pk-yrs)    Types: Cigarettes    Start date: 02/23/1972    Quit date: 02/22/2017    Years since quitting: 6.6   Smokeless tobacco: Never  Vaping Use   Vaping status: Never Used  Substance Use Topics   Alcohol use: No    Alcohol/week: 0.0 standard drinks of alcohol   Drug use: No       OPHTHALMIC EXAM: Base  Eye Exam     Visual Acuity (Snellen - Linear)       Right Left   Dist cc 20/150 CF at 3'   Dist ph cc NI NI    Correction: Glasses         Tonometry (Tonopen, 1:16 PM)       Right Left   Pressure 15 18  Pupils       Dark Light Shape React APD   Right 3 2 Round Brisk None   Left 3 2 Round Brisk None         Visual Fields       Left Right    Full Full         Extraocular Movement       Right Left    Full, Ortho Full, Ortho         Neuro/Psych     Oriented x3: Yes   Mood/Affect: Normal         Dilation     Both eyes: 1.0% Mydriacyl, 2.5% Phenylephrine @ 1:13 PM           Slit Lamp and Fundus Exam     Slit Lamp Exam       Right Left   Lids/Lashes Dermatochalasis - upper lid Dermatochalasis - upper lid   Conjunctiva/Sclera nasal and temporal pinguecula nasal and temporal pinguecula   Cornea arcus, tear film debris Trace Punctate epithelial erosions, arcus, tear film debris   Anterior Chamber deep, narrow temporal angle deep, narrow temporal angle   Iris Round and moderately dilated, mild anterior bowing Round and moderately dilated, mild anterior bowing   Lens 3+ Nuclear sclerosis with early brunescence, 3+ Cortical cataract 3+ Nuclear sclerosis with early brunescence, 3+ Cortical cataract   Anterior Vitreous mild syneresis, Posterior vitreous detachment Vitreous syneresis, Posterior vitreous detachment         Fundus Exam       Right Left   Disc Pink and Sharp, Compact, mild PPA Mild Pallor, Sharp rim, +elevation   C/D Ratio 0.0 0.1   Macula Blunted foveal reflex, +CNV with pigment clumping/ring, central IRH - stably improved, central SRF and edema -- improved, RPE mottling and clumping, Drusen, punctate heme on pigment ring -- stably resolved, no heme, +subretinal fibrosis Blunted foveal reflex, large area of GA with disciform scar and subretinal fibrosis, central pigment clumping, no heme   Vessels attenuated, Tortuous  attenuated, mild tortuosity, mild AV crossing changes   Periphery Attached, scattered reticular degeneration, paving stone degeneration, no heme Attached, scattered reticular degeneration, scattered paving stone degeneration           Refraction     Wearing Rx       Sphere Cylinder Axis   Right -6.50 +2.00 152   Left -5.25 +1.00 018    Type: SVL           IMAGING AND PROCEDURES  Imaging and Procedures for 10/17/2023  OCT, Retina - OU - Both Eyes       Right Eye Quality was borderline. Central Foveal Thickness: 309. Progression has improved. Findings include no IRF, no SRF, abnormal foveal contour, subretinal hyper-reflective material, choroidal neovascular membrane, intraretinal fluid, pigment epithelial detachment, subretinal fluid (Interval improvement in central SRHM and overlying IRF/SRF nasal macula).   Left Eye Quality was good. Central Foveal Thickness: 276. Progression has been stable. Findings include no IRF, no SRF, abnormal foveal contour, subretinal hyper-reflective material, lamellar hole, outer retinal atrophy (large central disciform scar with ORA, Foveal notch, trace cystic changes and temporal edema -- all stable).   Notes *Images captured and stored on drive  Diagnosis / Impression:  exu ARMD OU OD: Interval improvement in central SRHM and overlying IRF/SRF nasal macula OS: large central disciform scar with ORA, Foveal notch, trace cystic changes and temporal edema -- all stable  Clinical management:  See below  Abbreviations: NFP -  Normal foveal profile. CME - cystoid macular edema. PED - pigment epithelial detachment. IRF - intraretinal fluid. SRF - subretinal fluid. EZ - ellipsoid zone. ERM - epiretinal membrane. ORA - outer retinal atrophy. ORT - outer retinal tubulation. SRHM - subretinal hyper-reflective material. IRHM - intraretinal hyper-reflective material      Intravitreal Injection, Pharmacologic Agent - OD - Right Eye       Time  Out 10/17/2023. 1:56 PM. Confirmed correct patient, procedure, site, and patient consented.   Anesthesia Topical anesthesia was used. Anesthetic medications included Lidocaine 2%, Proparacaine 0.5%.   Procedure Preparation included 5% betadine to ocular surface, eyelid speculum. A (32g) needle was used.   Injection: 2 mg aflibercept 2 MG/0.05ML   Route: Intravitreal, Site: Right Eye   NDC: L6038910, Lot: [id number], Expiration date: 08/24/2024, Waste: 0 mL   Post-op Post injection exam found visual acuity of at least counting fingers. The patient tolerated the procedure well. There were no complications. The patient received written and verbal post procedure care education. Post injection medications were not given.              ASSESSMENT/PLAN:   ICD-10-CM   1. Exudative age-related macular degeneration of right eye with active choroidal neovascularization (HCC)  H35.3211 OCT, Retina - OU - Both Eyes    Intravitreal Injection, Pharmacologic Agent - OD - Right Eye    aflibercept (EYLEA) SOLN 2 mg    2. Exudative age-related macular degeneration of left eye with active choroidal neovascularization (HCC)  H35.3221     3. Essential hypertension  I10     4. Hypertensive retinopathy of both eyes  H35.033     5. Combined forms of age-related cataract of both eyes  H25.813      1. Exudative age related macular degeneration OD - lost to follow up from 8 weeks to 7 months (12.19.23 to 07.02.24) due to cardiac issues -- vision decreased to 20/100 OD on 07.02.24  - pt initially presented w/ 2 wk history of decreased vision OD (12.21.2021) - history of intravitreal injections OS with Dr. Luciana Axe in 2017 and earlier -- pt reports stopping visits after the development of a corneal abrasion from a lid speculum. - s/p IVA OD #1 (12.21.21), #2 (01.24.22), #3 (2.21.22), #4 (03.22.22), #5 (4.26.22), #6 (5.31.22), #7 (7.12.22), #8 (8.16.22), #9 (9.20.22), #10 (10.25.22), #11 (12.6.22), #12  (01.10.23), #13 (02.14.23), #14 (03.21.23), #15 (04.25.23), #16 06.06.23), #17 (08.01.23), #18 (09.12.23), #19 (10.31.23), #20 (12.19.23)  - s/p IVE OD #1 (07.02.24), #2 (07.30.24),#3 (08.27.24), #4 (09.25.24)  **history of increased SRF at 6 wk interval, noted on 12.6.22**  **history of increased SRF at 8 wk interval, noted on 08.01.23** - OCT shows OD: Interval improvement in central SRHM, IRF/SRF at 4 wks  - BCVA OD 20/150 - stable             - Recommend IVE OD #5 today, 10.23.24 w/ f/u in 4 wks  - RBA of procedure discussed, questions answered  - IVE informed consent obtained and signed, 07.02.24 (OD)  - see procedure note  - f/u in 4 wks -- DFE/OCT, possible injection   2. Exudative age related macular degeneration, OS - OS with history inactive disciform scar, but new focal IRH noted 5.31.22 -- stably resolved on exam today - s/p IVA OS #1 (05.31.22), #2 (07.12.22), #3 (8.15.22), #4 (9.20.22) for focal [facility] - history of intravitreal injections OS with Dr. Luciana Axe in 2017 and earlier -- pt reports stopping visits after the development of  a corneal abrasion from a lid speculum  - before being referred here, had not seen a retina specialist since 2017  - exam and OCT OS shows stable disciform scar -- no IRF/SRF  - BCVA CF OS -- stable - recommend holding IVA OS today - f/u 4 weeks, DFE, OCT  3,4. Hypertensive retinopathy OU - discussed importance of tight BP control - monitor  5. Mixed Cataract OU - The symptoms of cataract, surgical options, and treatments and risks were discussed with patient. - discussed diagnosis and progression - under the expert management of Dr. Zenaida Niece - clear from a retina standpoint to proceed with cataract surgery when pt and surgeon are ready  - OD surgery scheduled for 10/24/23  Ophthalmic Meds Ordered this visit:  Meds ordered this encounter  Medications   aflibercept (EYLEA) SOLN 2 mg     Return in about 4 weeks (around 11/14/2023) for f/u exu ARMD  OD, DFE, OCT.  There are no Patient Instructions on file for this visit.  This document serves as a record of services personally performed by Karie Chimera, MD, PhD. It was created on their behalf by De Blanch, an ophthalmic technician. The creation of this record is the provider's dictation and/or activities during the visit.    Electronically signed by: De Blanch, OA, 10/17/23  3:49 PM  This document serves as a record of services personally performed by Karie Chimera, MD, PhD. It was created on their behalf by Glee Arvin. Manson Passey, OA an ophthalmic technician. The creation of this record is the provider's dictation and/or activities during the visit.    Electronically signed by: Glee Arvin. Manson Passey, OA 10/17/23 3:49 PM   Karie Chimera, M.D., Ph.D. Diseases & Surgery of the Retina and Vitreous [facility]  I have reviewed the above documentation for accuracy and completeness, and I agree with the above. Karie Chimera, M.D., Ph.D. 10/17/23 3:49 PM   Abbreviations: M myopia (nearsighted); A astigmatism; H hyperopia (farsighted); P presbyopia; Mrx spectacle prescription;  CTL contact lenses; OD right eye; OS left eye; OU both eyes  XT exotropia; ET esotropia; PEK punctate epithelial keratitis; PEE punctate epithelial erosions; DES dry eye syndrome; MGD meibomian gland dysfunction; ATs artificial tears; PFAT's preservative free artificial tears; NSC nuclear sclerotic cataract; PSC posterior subcapsular cataract; ERM epi-retinal membrane; PVD posterior vitreous detachment; RD retinal detachment; DM diabetes mellitus; DR diabetic retinopathy; NPDR non-proliferative diabetic retinopathy; PDR proliferative diabetic retinopathy; CSME clinically significant macular edema; DME diabetic macular edema; dbh dot blot hemorrhages; CWS cotton wool spot; POAG primary open angle glaucoma; C/D cup-to-disc ratio; HVF humphrey visual field; GVF goldmann visual field; OCT optical  coherence tomography; IOP intraocular pressure; BRVO Branch retinal vein occlusion; CRVO central retinal vein occlusion; CRAO central retinal artery occlusion; BRAO branch retinal artery occlusion; RT retinal tear; SB scleral buckle; PPV pars plana vitrectomy; VH Vitreous hemorrhage; PRP panretinal laser photocoagulation; IVK intravitreal kenalog; VMT vitreomacular traction; MH Macular hole;  NVD neovascularization of the disc; NVE neovascularization elsewhere; AREDS age related eye disease study; ARMD age related macular degeneration; POAG primary open angle glaucoma; EBMD epithelial/anterior basement membrane dystrophy; ACIOL anterior chamber intraocular lens; IOL intraocular lens; PCIOL posterior chamber intraocular lens; Phaco/IOL phacoemulsification with intraocular lens placement; PRK photorefractive keratectomy; LASIK laser assisted in situ keratomileusis; HTN hypertension; DM diabetes mellitus; COPD chronic obstructive pulmonary disease

## 2023-10-17 ENCOUNTER — Ambulatory Visit (INDEPENDENT_AMBULATORY_CARE_PROVIDER_SITE_OTHER): Payer: Medicare Other | Admitting: Ophthalmology

## 2023-10-17 ENCOUNTER — Encounter (INDEPENDENT_AMBULATORY_CARE_PROVIDER_SITE_OTHER): Payer: Self-pay | Admitting: Ophthalmology

## 2023-10-17 DIAGNOSIS — H35033 Hypertensive retinopathy, bilateral: Secondary | ICD-10-CM

## 2023-10-17 DIAGNOSIS — H353231 Exudative age-related macular degeneration, bilateral, with active choroidal neovascularization: Secondary | ICD-10-CM

## 2023-10-17 DIAGNOSIS — H25813 Combined forms of age-related cataract, bilateral: Secondary | ICD-10-CM

## 2023-10-17 DIAGNOSIS — H353221 Exudative age-related macular degeneration, left eye, with active choroidal neovascularization: Secondary | ICD-10-CM

## 2023-10-17 DIAGNOSIS — I10 Essential (primary) hypertension: Secondary | ICD-10-CM

## 2023-10-17 DIAGNOSIS — H353211 Exudative age-related macular degeneration, right eye, with active choroidal neovascularization: Secondary | ICD-10-CM

## 2023-10-17 MED ORDER — AFLIBERCEPT 2MG/0.05ML IZ SOLN FOR KALEIDOSCOPE
2.0000 mg | INTRAVITREAL | Status: AC | PRN
Start: 2023-10-17 — End: 2023-10-17
  Administered 2023-10-17: 2 mg via INTRAVITREAL

## 2023-10-23 DIAGNOSIS — H268 Other specified cataract: Secondary | ICD-10-CM | POA: Diagnosis not present

## 2023-10-23 DIAGNOSIS — H25811 Combined forms of age-related cataract, right eye: Secondary | ICD-10-CM | POA: Diagnosis not present

## 2023-10-23 DIAGNOSIS — H2511 Age-related nuclear cataract, right eye: Secondary | ICD-10-CM | POA: Diagnosis not present

## 2023-11-07 NOTE — Progress Notes (Signed)
[facility] - Clinic Note  11/14/2023     CHIEF COMPLAINT Patient presents for Retina Follow Up  HISTORY OF PRESENT ILLNESS: Kimberly Waters is a 77 y.o. female who presents to the clinic today for:  HPI     Retina Follow Up   Patient presents with  Wet AMD.  In both eyes.  This started months ago.  Duration of 4 weeks.  Since onset it is gradually worsening.  I, the attending physician,  performed the HPI with the patient and updated documentation appropriately.        Comments   Patient states she had cataract surgery on the right eye and can see so much better. IOL sx OD with Dr. Zenaida Niece on 11.04.24. She fell last Saturday night and hit her head. Every since the vision has been blurry overall. She is using the eye drops from the Iol sx.       Last edited by Rennis Chris, MD on 11/14/2023  5:22 PM.    Pt had cataract sx OD with Dr. Zenaida Niece, she states her vision is so much better, she has another appt with Dr. Zenaida Niece on November 29th, she states she fell last Saturday night and hit her head in 2 places  Referring physician: Thana Ates, MD [address] [place],  [place] [address]  HISTORICAL INFORMATION:   Selected notes from the MEDICAL RECORD NUMBER Referred by Dr. Conley Rolls for eval of decreased VA OD.   CURRENT MEDICATIONS: No current outpatient medications on file. (Ophthalmic Drugs)   No current facility-administered medications for this visit. (Ophthalmic Drugs)   Current Outpatient Medications (Other)  Medication Sig   acetaminophen (TYLENOL) 325 MG tablet Take 2 tablets (650 mg total) by mouth every 4 (four) hours as needed for headache or mild pain.   albuterol (PROVENTIL,VENTOLIN) 90 MCG/ACT inhaler Inhale 2 puffs into the lungs every 4 (four) hours as needed. (Patient taking differently: Inhale 2 puffs into the lungs every 4 (four) hours as needed for wheezing or shortness of breath.)   amLODipine (NORVASC) 5 MG tablet Take 1 tablet (5 mg  total) by mouth daily.   arformoterol (BROVANA) 15 MCG/2ML NEBU Take 2 mLs (15 mcg total) by nebulization 2 (two) times daily.   betamethasone dipropionate 0.05 % cream 1 application   buPROPion (WELLBUTRIN SR) 150 MG 12 hr tablet Take 150 mg by mouth daily.    clobetasol ointment (TEMOVATE) 0.05 % as needed.   escitalopram (LEXAPRO) 10 MG tablet Take 1 tablet (10 mg total) by mouth daily.   ezetimibe (ZETIA) 10 MG tablet Take 1 tablet (10 mg total) by mouth daily.   furosemide (LASIX) 40 MG tablet Take 1 tablet (40 mg total) by mouth daily.   ipratropium (ATROVENT) 0.06 % nasal spray Place 2 sprays into the nose 4 (four) times daily.   ketoconazole (NIZORAL) 2 % cream Apply 1 application topically daily.   levocetirizine (XYZAL) 5 MG tablet Take 5 mg by mouth as needed for allergies.   levothyroxine (SYNTHROID) 112 MCG tablet Take 112 mcg by mouth daily before breakfast.   melatonin 5 MG TABS Take 1 tablet (5 mg total) by mouth at bedtime.   mometasone (ELOCON) 0.1 % ointment Apply topically as needed.   nitroGLYCERIN (NITROSTAT) 0.4 MG SL tablet Place 0.4 mg under the tongue every 5 (five) minutes as needed for chest pain.   omeprazole (PRILOSEC) 20 MG capsule Take 20 mg by mouth daily.  potassium chloride SA (KLOR-CON M) 20 MEQ tablet Take 2 tablets (40 mEq total) by mouth daily.   revefenacin (YUPELRI) 175 MCG/3ML nebulizer solution Take 3 mLs (175 mcg total) by nebulization daily.   rosuvastatin (CRESTOR) 20 MG tablet Take 1 tablet (20 mg total) by mouth daily.   spironolactone (ALDACTONE) 25 MG tablet TAKE 1 TABLET BY MOUTH EVERY DAY   tacrolimus (PROTOPIC) 0.1 % ointment Apply topically as needed.   No current facility-administered medications for this visit. (Other)   REVIEW OF SYSTEMS:   ALLERGIES Allergies  Allergen Reactions   Latex Other (See Comments)    Other reaction(s): Other (See Comments) Tears skin.   Adhesive [Tape] Other (See Comments)    (skin tears)   Tolerates PAPER TAPE   Codeine Nausea Only   Other Nausea Only    Anesthesia--nausea    PAST MEDICAL HISTORY Past Medical History:  Diagnosis Date   Allergy    Breast cancer (HCC)    Cataract    Colon polyps    Depression    Dyspnea    SEASONAL   Family history of breast cancer    Family history of colon cancer    GERD (gastroesophageal reflux disease)    History of radiation therapy 06/05/17-07/20/17   right chest wall 50.4 Gy in 28 fractions, right axillary nodal region 45 Gy in 25 fractions   Hyperlipidemia    Hypertension    Hypertensive retinopathy    Hypothyroidism    Macular degeneration    Peripheral neuropathy 06/18/2019   PONV (postoperative nausea and vomiting)    Past Surgical History:  Procedure Laterality Date   ABDOMINAL HYSTERECTOMY     KNEE SURGERY Right    LEFT HEART CATH AND CORONARY ANGIOGRAPHY N/A 02/02/2023   Procedure: LEFT HEART CATH AND CORONARY ANGIOGRAPHY;  Surgeon: Lennette Bihari, MD;  Location: [facility] INVASIVE [facility];  Service: Cardiovascular;  Laterality: N/A;   MASTECTOMY W/ SENTINEL NODE BIOPSY Right 03/05/2017   Procedure: BILATERAL TOTAL MASTECTOMIES WITH RIGHT SENTINEL LYMPH NODE BIOPSIES;  Surgeon: Glenna Fellows, MD;  Location: [facility] OR;  Service: General;  Laterality: Right;   SHOULDER SURGERY     BILAT   FAMILY HISTORY Family History  Problem Relation Age of Onset   Heart disease Mother 36       stents   Colon cancer Maternal Grandfather 59   Colon cancer Maternal Aunt 65   Colon cancer Maternal Uncle        dx in his 56s   Breast cancer Paternal Aunt 87   Breast cancer Cousin 44       paternal first cousin   Lung cancer Paternal Grandfather    Colon cancer Maternal Uncle    Head & neck cancer Maternal Uncle        oral cancer   SOCIAL HISTORY Social History   Tobacco Use   Smoking status: Former    Current packs/day: 0.00    Average packs/day: 1 pack/day for 45.0 years (45.0 ttl pk-yrs)    Types: Cigarettes    Start  date: 02/23/1972    Quit date: 02/22/2017    Years since quitting: 6.7   Smokeless tobacco: Never  Vaping Use   Vaping status: Never Used  Substance Use Topics   Alcohol use: No    Alcohol/week: 0.0 standard drinks of alcohol   Drug use: No       OPHTHALMIC EXAM: Base Eye Exam     Visual Acuity (Snellen - Linear)  Right Left   Dist [place] 20/60 +2 CF at 3'   Dist ph [place] 20/50 +2 20/400         Tonometry (Tonopen, 12:59 PM)       Right Left   Pressure 15 18         Pupils       Dark Light Shape React APD   Right 3 2 Round Brisk None   Left 3 2 Round Brisk None         Visual Fields       Left Right    Full Full         Extraocular Movement       Right Left    Full, Ortho Full, Ortho         Neuro/Psych     Oriented x3: Yes   Mood/Affect: Normal         Dilation     Both eyes: 1.0% Mydriacyl, 2.5% Phenylephrine @ 12:50 PM           Slit Lamp and Fundus Exam     Slit Lamp Exam       Right Left   Lids/Lashes Dermatochalasis - upper lid Dermatochalasis - upper lid   Conjunctiva/Sclera nasal and temporal pinguecula nasal and temporal pinguecula   Cornea arcus, well healed cataract wound, 1+ Punctate epithelial erosions, trace EBMD, trace tear film debris Trace Punctate epithelial erosions, arcus, tear film debris   Anterior Chamber deep, narrow temporal angle, 0.5+ fine cell and pigment deep, narrow temporal angle   Iris Round and moderately dilated, mild anterior bowing Round and moderately dilated, mild anterior bowing   Lens PC IOL in good position 3+ Nuclear sclerosis with early brunescence, 3+ Cortical cataract   Anterior Vitreous mild syneresis, Posterior vitreous detachment Vitreous syneresis, Posterior vitreous detachment         Fundus Exam       Right Left   Disc Pink and Sharp, Compact, mild PPA Mild Pallor, Sharp rim, +elevation   C/D Ratio 0.0 0.1   Macula Blunted foveal reflex, +CNV with pigment clumping/ring, central  IRH - stably improved, central SRF and edema -- improved, RPE mottling and clumping, Drusen, punctate heme on pigment ring -- stably resolved, no heme, +subretinal fibrosis Blunted foveal reflex, large area of GA with disciform scar and subretinal fibrosis, central pigment clumping, no heme   Vessels attenuated, Tortuous attenuated, mild tortuosity, mild AV crossing changes   Periphery Attached, scattered reticular degeneration, paving stone degeneration, no heme Attached, scattered reticular degeneration, scattered paving stone degeneration           Refraction     Wearing Rx       Sphere Cylinder Axis   Right -6.50 +2.00 152   Left -5.25 +1.00 018    Type: SVL         Manifest Refraction       Sphere Cylinder Axis Dist VA   Right -0.25 +0.25 015 20/50+2   Left               IMAGING AND PROCEDURES  Imaging and Procedures for 11/14/2023  OCT, Retina - OU - Both Eyes       Right Eye Quality was borderline. Central Foveal Thickness: 304. Progression has improved. Findings include no IRF, no SRF, abnormal foveal contour, subretinal hyper-reflective material, choroidal neovascular membrane, intraretinal fluid, pigment epithelial detachment, subretinal fluid (Stable improvement in central SRHM and overlying IRF/SRF -- just trace cystic changes remain).  Left Eye Quality was good. Central Foveal Thickness: 278. Progression has been stable. Findings include no IRF, no SRF, abnormal foveal contour, subretinal hyper-reflective material, lamellar hole, outer retinal atrophy (large central disciform scar with ORA, Foveal notch, trace cystic changes and temporal edema -- all stable).   Notes *Images captured and stored on drive  Diagnosis / Impression:  exu ARMD OU OD: Stable improvement in central SRHM and overlying IRF/SRF -- just trace cystic changes remain OS: large central disciform scar with ORA, Foveal notch, trace cystic changes and temporal edema -- all  stable  Clinical management:  See below  Abbreviations: NFP - Normal foveal profile. CME - cystoid macular edema. PED - pigment epithelial detachment. IRF - intraretinal fluid. SRF - subretinal fluid. EZ - ellipsoid zone. ERM - epiretinal membrane. ORA - outer retinal atrophy. ORT - outer retinal tubulation. SRHM - subretinal hyper-reflective material. IRHM - intraretinal hyper-reflective material      Intravitreal Injection, Pharmacologic Agent - OD - Right Eye       Time Out 11/14/2023. 1:28 PM. Confirmed correct patient, procedure, site, and patient consented.   Anesthesia Topical anesthesia was used. Anesthetic medications included Lidocaine 2%, Proparacaine 0.5%.   Procedure Preparation included 5% betadine to ocular surface, eyelid speculum. A (32g) needle was used.   Injection: 2 mg aflibercept 2 MG/0.05ML   Route: Intravitreal, Site: Right Eye   NDC: L6038910, Lot: [id number], Expiration date: 03/24/2025, Waste: 0 mL   Post-op Post injection exam found visual acuity of at least counting fingers. The patient tolerated the procedure well. There were no complications. The patient received written and verbal post procedure care education. Post injection medications were not given.            ASSESSMENT/PLAN:   ICD-10-CM   1. Exudative age-related macular degeneration of right eye with active choroidal neovascularization (HCC)  H35.3211 OCT, Retina - OU - Both Eyes    Intravitreal Injection, Pharmacologic Agent - OD - Right Eye    aflibercept (EYLEA) SOLN 2 mg    2. Exudative age-related macular degeneration of left eye with active choroidal neovascularization (HCC)  H35.3221     3. Essential hypertension  I10     4. Hypertensive retinopathy of both eyes  H35.033     5. Combined forms of age-related cataract of both eyes  H25.813      1. Exudative age related macular degeneration OD - lost to follow up from 8 weeks to 7 months (12.19.23 to 07.02.24) due to  cardiac issues -- vision decreased to 20/100 OD on 07.02.24  - pt initially presented w/ 2 wk history of decreased vision OD (12.21.2021) - history of intravitreal injections OS with Dr. Luciana Axe in 2017 and earlier -- pt reports stopping visits after the development of a corneal abrasion from a lid speculum. - s/p IVA OD #1 (12.21.21), #2 (01.24.22), #3 (2.21.22), #4 (03.22.22), #5 (4.26.22), #6 (5.31.22), #7 (7.12.22), #8 (8.16.22), #9 (9.20.22), #10 (10.25.22), #11 (12.6.22), #12 (01.10.23), #13 (02.14.23), #14 (03.21.23), #15 (04.25.23), #16 06.06.23), #17 (08.01.23), #18 (09.12.23), #19 (10.31.23), #20 (12.19.23)  - s/p IVE OD #1 (07.02.24), #2 (07.30.24),#3 (08.27.24), #4 (09.25.24), #5 (10.23.24)  **history of increased SRF at 6 wk interval, noted on 12.6.22**  **history of increased SRF at 8 wk interval, noted on 08.01.23** - OCT shows stable improvement in central [facility] and overlying IRF/SRF -- just trace cystic changes remain  - BCVA OD improved to 20/50 from 20/150 -- s/p CE IOL             -  Recommend IVE OD #6 today, 11.20.24 w/ f/u in 4 wks  - RBA of procedure discussed, questions answered  - IVE informed consent obtained and signed, 07.02.24 (OD)  - see procedure note  - f/u in 4 wks -- DFE/OCT, possible injection   2. Exudative age related macular degeneration, OS - OS with history inactive disciform scar, but new focal IRH noted 5.31.22 -- stably resolved on exam today - s/p IVA OS #1 (05.31.22), #2 (07.12.22), #3 (8.15.22), #4 (9.20.22) for focal [facility] - history of intravitreal injections OS with Dr. Luciana Axe in 2017 and earlier -- pt reports stopping visits after the development of a corneal abrasion from a lid speculum  - before being referred here, had not seen a retina specialist since 2017  - exam and OCT OS shows stable disciform scar -- no IRF/SRF  - BCVA CF OS -- stable - recommend holding IVA OS today - f/u 4 weeks, DFE, OCT  3,4. Hypertensive retinopathy OU - discussed  importance of tight BP control - monitor  5. Mixed Cataract OS - The symptoms of cataract, surgical options, and treatments and risks were discussed with patient. - discussed diagnosis and progression - under the expert management of Dr. Zenaida Niece - clear from a retina standpoint to proceed with cataract surgery when pt and surgeon are ready   6. Pseudophakia OD  - s/p CE/IOL (Dr. Zenaida Niece)  - IOL in good position, doing well  - monitor   Ophthalmic Meds Ordered this visit:  Meds ordered this encounter  Medications   aflibercept (EYLEA) SOLN 2 mg     Return in about 4 weeks (around 12/12/2023) for f/u exu ARMD OD, DFE, OCT.  There are no Patient Instructions on file for this visit.  This document serves as a record of services personally performed by Karie Chimera, MD, PhD. It was created on their behalf by De Blanch, an ophthalmic technician. The creation of this record is the provider's dictation and/or activities during the visit.    Electronically signed by: De Blanch, OA, 11/17/23  1:54 AM  This document serves as a record of services personally performed by Karie Chimera, MD, PhD. It was created on their behalf by Glee Arvin. Manson Passey, OA an ophthalmic technician. The creation of this record is the provider's dictation and/or activities during the visit.    Electronically signed by: Glee Arvin. Manson Passey, OA 11/17/23 1:54 AM  Karie Chimera, M.D., Ph.D. Diseases & Surgery of the Retina and Vitreous [facility]  I have reviewed the above documentation for accuracy and completeness, and I agree with the above. Karie Chimera, M.D., Ph.D. 11/17/23 1:56 AM  Abbreviations: M myopia (nearsighted); A astigmatism; H hyperopia (farsighted); P presbyopia; Mrx spectacle prescription;  CTL contact lenses; OD right eye; OS left eye; OU both eyes  XT exotropia; ET esotropia; PEK punctate epithelial keratitis; PEE punctate epithelial erosions; DES dry eye syndrome;  MGD meibomian gland dysfunction; ATs artificial tears; PFAT's preservative free artificial tears; NSC nuclear sclerotic cataract; PSC posterior subcapsular cataract; ERM epi-retinal membrane; PVD posterior vitreous detachment; RD retinal detachment; DM diabetes mellitus; DR diabetic retinopathy; NPDR non-proliferative diabetic retinopathy; PDR proliferative diabetic retinopathy; CSME clinically significant macular edema; DME diabetic macular edema; dbh dot blot hemorrhages; CWS cotton wool spot; POAG primary open angle glaucoma; C/D cup-to-disc ratio; HVF humphrey visual field; GVF goldmann visual field; OCT optical coherence tomography; IOP intraocular pressure; BRVO Branch retinal vein occlusion; CRVO central retinal vein occlusion; CRAO central  retinal artery occlusion; BRAO branch retinal artery occlusion; RT retinal tear; SB scleral buckle; PPV pars plana vitrectomy; VH Vitreous hemorrhage; PRP panretinal laser photocoagulation; IVK intravitreal kenalog; VMT vitreomacular traction; MH Macular hole;  NVD neovascularization of the disc; NVE neovascularization elsewhere; AREDS age related eye disease study; ARMD age related macular degeneration; POAG primary open angle glaucoma; EBMD epithelial/anterior basement membrane dystrophy; ACIOL anterior chamber intraocular lens; IOL intraocular lens; PCIOL posterior chamber intraocular lens; Phaco/IOL phacoemulsification with intraocular lens placement; PRK photorefractive keratectomy; LASIK laser assisted in situ keratomileusis; HTN hypertension; DM diabetes mellitus; COPD chronic obstructive pulmonary disease

## 2023-11-14 ENCOUNTER — Encounter (INDEPENDENT_AMBULATORY_CARE_PROVIDER_SITE_OTHER): Payer: Self-pay | Admitting: Ophthalmology

## 2023-11-14 ENCOUNTER — Ambulatory Visit (INDEPENDENT_AMBULATORY_CARE_PROVIDER_SITE_OTHER): Payer: Medicare Other | Admitting: Ophthalmology

## 2023-11-14 ENCOUNTER — Encounter (INDEPENDENT_AMBULATORY_CARE_PROVIDER_SITE_OTHER): Payer: Medicare Other | Admitting: Ophthalmology

## 2023-11-14 DIAGNOSIS — I10 Essential (primary) hypertension: Secondary | ICD-10-CM

## 2023-11-14 DIAGNOSIS — H353211 Exudative age-related macular degeneration, right eye, with active choroidal neovascularization: Secondary | ICD-10-CM

## 2023-11-14 DIAGNOSIS — H353221 Exudative age-related macular degeneration, left eye, with active choroidal neovascularization: Secondary | ICD-10-CM

## 2023-11-14 DIAGNOSIS — H353231 Exudative age-related macular degeneration, bilateral, with active choroidal neovascularization: Secondary | ICD-10-CM

## 2023-11-14 DIAGNOSIS — H35033 Hypertensive retinopathy, bilateral: Secondary | ICD-10-CM

## 2023-11-14 DIAGNOSIS — H25813 Combined forms of age-related cataract, bilateral: Secondary | ICD-10-CM | POA: Diagnosis not present

## 2023-11-14 MED ORDER — AFLIBERCEPT 2MG/0.05ML IZ SOLN FOR KALEIDOSCOPE
2.0000 mg | INTRAVITREAL | Status: AC | PRN
Start: 2023-11-14 — End: 2023-11-14
  Administered 2023-11-14: 2 mg via INTRAVITREAL

## 2023-11-21 DIAGNOSIS — Z961 Presence of intraocular lens: Secondary | ICD-10-CM | POA: Diagnosis not present

## 2023-11-21 DIAGNOSIS — H25812 Combined forms of age-related cataract, left eye: Secondary | ICD-10-CM | POA: Diagnosis not present

## 2023-11-27 DIAGNOSIS — H2512 Age-related nuclear cataract, left eye: Secondary | ICD-10-CM | POA: Diagnosis not present

## 2023-11-27 DIAGNOSIS — H268 Other specified cataract: Secondary | ICD-10-CM | POA: Diagnosis not present

## 2023-11-27 DIAGNOSIS — H25812 Combined forms of age-related cataract, left eye: Secondary | ICD-10-CM | POA: Diagnosis not present

## 2023-12-06 DIAGNOSIS — Z79899 Other long term (current) drug therapy: Secondary | ICD-10-CM | POA: Diagnosis not present

## 2023-12-12 ENCOUNTER — Encounter (INDEPENDENT_AMBULATORY_CARE_PROVIDER_SITE_OTHER): Payer: Medicare Other | Admitting: Ophthalmology

## 2023-12-12 DIAGNOSIS — I10 Essential (primary) hypertension: Secondary | ICD-10-CM

## 2023-12-12 DIAGNOSIS — H25813 Combined forms of age-related cataract, bilateral: Secondary | ICD-10-CM

## 2023-12-12 DIAGNOSIS — H35033 Hypertensive retinopathy, bilateral: Secondary | ICD-10-CM

## 2023-12-12 DIAGNOSIS — H353211 Exudative age-related macular degeneration, right eye, with active choroidal neovascularization: Secondary | ICD-10-CM

## 2023-12-12 DIAGNOSIS — H353221 Exudative age-related macular degeneration, left eye, with active choroidal neovascularization: Secondary | ICD-10-CM

## 2023-12-13 DIAGNOSIS — Z23 Encounter for immunization: Secondary | ICD-10-CM | POA: Diagnosis not present

## 2023-12-13 DIAGNOSIS — I10 Essential (primary) hypertension: Secondary | ICD-10-CM | POA: Diagnosis not present

## 2023-12-13 DIAGNOSIS — H9190 Unspecified hearing loss, unspecified ear: Secondary | ICD-10-CM | POA: Diagnosis not present

## 2023-12-13 DIAGNOSIS — E039 Hypothyroidism, unspecified: Secondary | ICD-10-CM | POA: Diagnosis not present

## 2023-12-13 DIAGNOSIS — L309 Dermatitis, unspecified: Secondary | ICD-10-CM | POA: Diagnosis not present

## 2023-12-26 NOTE — Progress Notes (Signed)
 [facility] - Clinic Note  12/31/2023     CHIEF COMPLAINT Patient presents for Retina Follow Up  HISTORY OF PRESENT ILLNESS: Kimberly Waters is a 78 y.o. female who presents to the clinic today for:  HPI     Retina Follow Up   Patient presents with  Wet AMD.  In right eye.  Severity is moderate.  Duration of 6.5 weeks.  Since onset it is gradually worsening.  I, the attending physician,  performed the HPI with the patient and updated documentation appropriately.        Comments   Pt here for 6.5wk ret f/u exu ARMD OD. Pt states VA is a bit worse OD, can see squiggly lines. Pt did have cataract sx w. Dr. Fleeta OU. She noticed the VA change after sx in OD. Dr. Fleeta told pt she needed another eye shot for OD. Pt was given rx for glasses but has not filled it. Pt is still on a yellow gtt once daily OU.       Last edited by Valdemar Rogue, MD on 12/31/2023  1:55 PM.    Pt is delayed from 4 weeks to 6.5 weeks due to having a death in her family, she states she had cataract sx OS about 4 weeks ago   Referring physician: Dwight Trula SQUIBB, MD [address] [place],  [place] [address]  HISTORICAL INFORMATION:   Selected notes from the MEDICAL RECORD NUMBER Referred by Dr. Ladora for eval of decreased VA OD.   CURRENT MEDICATIONS: No current outpatient medications on file. (Ophthalmic Drugs)   No current facility-administered medications for this visit. (Ophthalmic Drugs)   Current Outpatient Medications (Other)  Medication Sig   acetaminophen  (TYLENOL ) 325 MG tablet Take 2 tablets (650 mg total) by mouth every 4 (four) hours as needed for headache or mild pain.   albuterol  (PROVENTIL ,VENTOLIN ) 90 MCG/ACT inhaler Inhale 2 puffs into the lungs every 4 (four) hours as needed. (Patient taking differently: Inhale 2 puffs into the lungs every 4 (four) hours as needed for wheezing or shortness of breath.)   amLODipine  (NORVASC ) 5 MG tablet Take 1 tablet (5 mg total) by  mouth daily.   arformoterol  (BROVANA ) 15 MCG/2ML NEBU Take 2 mLs (15 mcg total) by nebulization 2 (two) times daily.   betamethasone  dipropionate 0.05 % cream 1 application   buPROPion  (WELLBUTRIN  SR) 150 MG 12 hr tablet Take 150 mg by mouth daily.    clobetasol  ointment (TEMOVATE ) 0.05 % as needed.   escitalopram  (LEXAPRO ) 10 MG tablet Take 1 tablet (10 mg total) by mouth daily.   ezetimibe  (ZETIA ) 10 MG tablet Take 1 tablet (10 mg total) by mouth daily.   furosemide  (LASIX ) 40 MG tablet Take 1 tablet (40 mg total) by mouth daily.   ipratropium (ATROVENT ) 0.06 % nasal spray Place 2 sprays into the nose 4 (four) times daily.   ketoconazole  (NIZORAL ) 2 % cream Apply 1 application topically daily.   levocetirizine (XYZAL ) 5 MG tablet Take 5 mg by mouth as needed for allergies.   levothyroxine  (SYNTHROID ) 112 MCG tablet Take 112 mcg by mouth daily before breakfast.   melatonin 5 MG TABS Take 1 tablet (5 mg total) by mouth at bedtime.   mometasone  (ELOCON ) 0.1 % ointment Apply topically as needed.   nitroGLYCERIN  (NITROSTAT ) 0.4 MG SL tablet Place 0.4 mg under the tongue every 5 (five) minutes as needed for chest pain.   omeprazole  (PRILOSEC) 20 MG capsule  Take 20 mg by mouth daily.   potassium chloride  SA (KLOR-CON  M) 20 MEQ tablet Take 2 tablets (40 mEq total) by mouth daily.   revefenacin  (YUPELRI ) 175 MCG/3ML nebulizer solution Take 3 mLs (175 mcg total) by nebulization daily.   rosuvastatin  (CRESTOR ) 20 MG tablet Take 1 tablet (20 mg total) by mouth daily.   spironolactone  (ALDACTONE ) 25 MG tablet TAKE 1 TABLET BY MOUTH EVERY DAY   tacrolimus (PROTOPIC) 0.1 % ointment Apply topically as needed.   No current facility-administered medications for this visit. (Other)   REVIEW OF SYSTEMS: ROS   Positive for: Neurological, Eyes, Respiratory Negative for: Constitutional, Gastrointestinal, Skin, Genitourinary, Musculoskeletal, HENT, Endocrine, Cardiovascular, Psychiatric, Allergic/Imm,  Heme/Lymph Last edited by Antonetta Almetta BRAVO, COT on 12/31/2023 12:29 PM.      ALLERGIES Allergies  Allergen Reactions   Latex Other (See Comments)    Other reaction(s): Other (See Comments) Tears skin.   Adhesive [Tape] Other (See Comments)    (skin tears)  Tolerates PAPER TAPE   Codeine  Nausea Only   Other Nausea Only    Anesthesia--nausea    PAST MEDICAL HISTORY Past Medical History:  Diagnosis Date   Allergy    Breast cancer (HCC)    Colon polyps    Depression    Dyspnea    SEASONAL   Family history of breast cancer    Family history of colon cancer    GERD (gastroesophageal reflux disease)    History of radiation therapy 06/05/17-07/20/17   right chest wall 50.4 Gy in 28 fractions, right axillary nodal region 45 Gy in 25 fractions   Hyperlipidemia    Hypertension    Hypertensive retinopathy    Hypothyroidism    Macular degeneration    Peripheral neuropathy 06/18/2019   PONV (postoperative nausea and vomiting)    Past Surgical History:  Procedure Laterality Date   ABDOMINAL HYSTERECTOMY     CATARACT EXTRACTION, BILATERAL Bilateral    Dr. [facility]   KNEE SURGERY Right    LEFT HEART CATH AND CORONARY ANGIOGRAPHY N/A 02/02/2023   Procedure: LEFT HEART CATH AND CORONARY ANGIOGRAPHY;  Surgeon: Burnard Debby LABOR, MD;  Location: [facility] INVASIVE CV LAB;  Service: Cardiovascular;  Laterality: N/A;   MASTECTOMY W/ SENTINEL NODE BIOPSY Right 03/05/2017   Procedure: BILATERAL TOTAL MASTECTOMIES WITH RIGHT SENTINEL LYMPH NODE BIOPSIES;  Surgeon: Morene Olives, MD;  Location: [facility] OR;  Service: General;  Laterality: Right;   SHOULDER SURGERY     BILAT   FAMILY HISTORY Family History  Problem Relation Age of Onset   Heart disease Mother 84       stents   Colon cancer Maternal Grandfather 59   Colon cancer Maternal Aunt 65   Colon cancer Maternal Uncle        dx in his 82s   Breast cancer Paternal Aunt 14   Breast cancer Cousin 10       paternal first  cousin   Lung cancer Paternal Grandfather    Colon cancer Maternal Uncle    Head & neck cancer Maternal Uncle        oral cancer   SOCIAL HISTORY Social History   Tobacco Use   Smoking status: Former    Current packs/day: 0.00    Average packs/day: 1 pack/day for 45.0 years (45.0 ttl pk-yrs)    Types: Cigarettes    Start date: 02/23/1972    Quit date: 02/22/2017    Years since quitting: 6.8   Smokeless tobacco: Never  Vaping  Use   Vaping status: Never Used  Substance Use Topics   Alcohol use: No    Alcohol/week: 0.0 standard drinks of alcohol   Drug use: No       OPHTHALMIC EXAM: Base Eye Exam     Visual Acuity (Snellen - Linear)       Right Left   Dist [place] 20/70 -1 CF at 3'   Dist ph [place] 20/60 -2 NI         Tonometry (Tonopen, 12:35 PM)       Right Left   Pressure 13 13         Pupils       Pupils Dark Light Shape React APD   Right PERRL 3 2 Round Brisk None   Left PERRL 3 2 Round Brisk None         Visual Fields (Counting fingers)       Left Right    Full Full         Extraocular Movement       Right Left    Full, Ortho Full, Ortho         Neuro/Psych     Oriented x3: Yes   Mood/Affect: Normal         Dilation     Both eyes: 1.0% Mydriacyl, 2.5% Phenylephrine  @ 12:37 PM           Slit Lamp and Fundus Exam     Slit Lamp Exam       Right Left   Lids/Lashes Dermatochalasis - upper lid Dermatochalasis - upper lid, mild MGD   Conjunctiva/Sclera nasal and temporal pinguecula nasal and temporal pinguecula   Cornea arcus, well healed cataract wound, 2-3+ Punctate epithelial erosions Trace Punctate epithelial erosions, arcus, well healed cataract wound   Anterior Chamber deep, narrow temporal angle, 0.5+ fine cell and pigment deep, narrow temporal angle   Iris Round and moderately dilated, mild anterior bowing Round and moderately dilated, mild anterior bowing   Lens PC IOL in good position PC IOL in good position   Anterior  Vitreous mild syneresis, Posterior vitreous detachment mild syneresis, Posterior vitreous detachment         Fundus Exam       Right Left   Disc Pink and Sharp, Compact, mild PPA Mild Pallor, Sharp rim, +elevation   C/D Ratio 0.0 0.1   Macula Blunted foveal reflex, +CNV with pigment clumping/ring, central IRH - stably improved, central SRF and edema -- improved, RPE mottling and clumping, Drusen, punctate heme on pigment ring -- stably resolved, no heme, +subretinal fibrosis, trace cystic changes Blunted foveal reflex, large area of GA with disciform scar and subretinal fibrosis, central pigment clumping, no heme   Vessels attenuated, Tortuous attenuated, mild tortuosity, mild AV crossing changes   Periphery Attached, scattered reticular degeneration, paving stone degeneration, no heme Attached, scattered reticular degeneration, scattered paving stone degeneration, No heme           IMAGING AND PROCEDURES  Imaging and Procedures for 12/31/2023  OCT, Retina - OU - Both Eyes       Right Eye Quality was borderline. Central Foveal Thickness: 326. Progression has been stable. Findings include no IRF, no SRF, abnormal foveal contour, subretinal hyper-reflective material, choroidal neovascular membrane, intraretinal fluid, pigment epithelial detachment, subretinal fluid (Stable improvement in central SRHM and overlying IRF/SRF -- mild cystic changes remain).   Left Eye Quality was good. Central Foveal Thickness: 280. Progression has been stable. Findings include no IRF, no  SRF, abnormal foveal contour, subretinal hyper-reflective material, lamellar hole, outer retinal atrophy (large central disciform scar with ORA, Foveal notch, trace cystic changes and temporal edema -- all stable).   Notes *Images captured and stored on drive  Diagnosis / Impression:  exu ARMD OU OD: Stable improvement in central SRHM and overlying IRF/SRF -- mild cystic changes remain OS: large central disciform scar  with ORA, Foveal notch, trace cystic changes and temporal edema -- all stable  Clinical management:  See below  Abbreviations: NFP - Normal foveal profile. CME - cystoid macular edema. PED - pigment epithelial detachment. IRF - intraretinal fluid. SRF - subretinal fluid. EZ - ellipsoid zone. ERM - epiretinal membrane. ORA - outer retinal atrophy. ORT - outer retinal tubulation. SRHM - subretinal hyper-reflective material. IRHM - intraretinal hyper-reflective material      Intravitreal Injection, Pharmacologic Agent - OD - Right Eye       Time Out 12/31/2023. 1:17 PM. Confirmed correct patient, procedure, site, and patient consented.   Anesthesia Topical anesthesia was used. Anesthetic medications included Lidocaine  2%, Proparacaine 0.5%.   Procedure Preparation included 5% betadine to ocular surface, eyelid speculum. A (32g) needle was used.   Injection: 2 mg aflibercept  2 MG/0.05ML   Route: Intravitreal, Site: Right Eye   NDC: [id number], Lot: [id number], Expiration date: 05/24/2024, Waste: 0 mL   Post-op Post injection exam found visual acuity of at least counting fingers. The patient tolerated the procedure well. There were no complications. The patient received written and verbal post procedure care education. Post injection medications were not given.             ASSESSMENT/PLAN:   ICD-10-CM   1. Exudative age-related macular degeneration of right eye with active choroidal neovascularization (HCC)  H35.3211 OCT, Retina - OU - Both Eyes    Intravitreal Injection, Pharmacologic Agent - OD - Right Eye    aflibercept  (EYLEA ) SOLN 2 mg    2. Exudative age-related macular degeneration of left eye with active choroidal neovascularization (HCC)  H35.3221     3. Essential hypertension  I10     4. Hypertensive retinopathy of both eyes  H35.033     5. Pseudophakia, both eyes  Z96.1       1. Exudative age related macular degeneration OD  - delayed f/u today (11.20.24 to  01.06.25): 6.5 wks instead of 4 -- death in family - lost to follow up from 8 weeks to 7 months (12.19.23 to 07.02.24) due to cardiac issues -- vision decreased to 20/100 OD on 07.02.24  - pt initially presented w/ 2 wk history of decreased vision OD (12.21.2021) - history of intravitreal injections OS with Dr. Elner in 2017 and earlier -- pt reports stopping visits after the development of a corneal abrasion from a lid speculum. - s/p IVA OD #1 (12.21.21), #2 (01.24.22), #3 (2.21.22), #4 (03.22.22), #5 (4.26.22), #6 (5.31.22), #7 (7.12.22), #8 (8.16.22), #9 (9.20.22), #10 (10.25.22), #11 (12.6.22), #12 (01.10.23), #13 (02.14.23), #14 (03.21.23), #15 (04.25.23), #16 06.06.23), #17 (08.01.23), #18 (09.12.23), #19 (10.31.23), #20 (12.19.23)  - s/p IVE OD #1 (07.02.24), #2 (07.30.24),#3 (08.27.24), #4 (09.25.24), #5 (10.23.24), #6 (11.20.24)  **history of increased SRF at 6 wk interval, noted on 12.6.22**  **history of increased SRF at 8 wk interval, noted on 08.01.23** - OCT shows stable improvement in central [facility] and overlying IRF/SRF -- mild trace cystic changes remain at 6.5 weeks  - BCVA OD 20/60 from 20/50             -  Recommend IVE OD #7 today, 01.06.25 w/ f/u back to 4 wks  - RBA of procedure discussed, questions answered  - IVE informed consent obtained and signed, 07.02.24 (OD)  - see procedure note  - f/u in 4 wks -- DFE/OCT, possible injection   2. Exudative age related macular degeneration, OS - OS with history inactive disciform scar, but new focal IRH noted 5.31.22 -- stably resolved on exam today - s/p IVA OS #1 (05.31.22), #2 (07.12.22), #3 (8.15.22), #4 (9.20.22) for focal [facility] - history of intravitreal injections OS with Dr. Elner in 2017 and earlier -- pt reports stopping visits after the development of a corneal abrasion from a lid speculum  - before being referred here, had not seen a retina specialist since 2017  - exam and OCT OS shows stable disciform scar -- no  IRF/SRF  - BCVA CF OS -- stable - recommend holding IVA OS today - f/u 4 weeks, DFE, OCT  3,4. Hypertensive retinopathy OU - discussed importance of tight BP control - monitor  5. Pseudophakia OU  - s/p CE/IOL (Dr. Fleeta)  - IOL in good position, doing well  - monitor  Ophthalmic Meds Ordered this visit:  Meds ordered this encounter  Medications   aflibercept  (EYLEA ) SOLN 2 mg     Return in about 4 weeks (around 01/28/2024) for f/u exu ARMD OD, DFE, OCT.  There are no Patient Instructions on file for this visit.  This document serves as a record of services personally performed by Redell JUDITHANN Hans, MD, PhD. It was created on their behalf by Alan PARAS. Delores, OA an ophthalmic technician. The creation of this record is the provider's dictation and/or activities during the visit.    Electronically signed by: Alan PARAS. Delores, OA 01/01/24 12:35 AM  Redell JUDITHANN Hans, M.D., Ph.D. Diseases & Surgery of the Retina and Vitreous [facility]  I have reviewed the above documentation for accuracy and completeness, and I agree with the above. Redell JUDITHANN Hans, M.D., Ph.D. 01/01/24 12:38 AM   Abbreviations: M myopia (nearsighted); A astigmatism; H hyperopia (farsighted); P presbyopia; Mrx spectacle prescription;  CTL contact lenses; OD right eye; OS left eye; OU both eyes  XT exotropia; ET esotropia; PEK punctate epithelial keratitis; PEE punctate epithelial erosions; DES dry eye syndrome; MGD meibomian gland dysfunction; ATs artificial tears; PFAT's preservative free artificial tears; NSC nuclear sclerotic cataract; PSC posterior subcapsular cataract; ERM epi-retinal membrane; PVD posterior vitreous detachment; RD retinal detachment; DM diabetes mellitus; DR diabetic retinopathy; NPDR non-proliferative diabetic retinopathy; PDR proliferative diabetic retinopathy; CSME clinically significant macular edema; DME diabetic macular edema; dbh dot blot hemorrhages; CWS cotton wool spot;  POAG primary open angle glaucoma; C/D cup-to-disc ratio; HVF humphrey visual field; GVF goldmann visual field; OCT optical coherence tomography; IOP intraocular pressure; BRVO Branch retinal vein occlusion; CRVO central retinal vein occlusion; CRAO central retinal artery occlusion; BRAO branch retinal artery occlusion; RT retinal tear; SB scleral buckle; PPV pars plana vitrectomy; VH Vitreous hemorrhage; PRP panretinal laser photocoagulation; IVK intravitreal kenalog; VMT vitreomacular traction; MH Macular hole;  NVD neovascularization of the disc; NVE neovascularization elsewhere; AREDS age related eye disease study; ARMD age related macular degeneration; POAG primary open angle glaucoma; EBMD epithelial/anterior basement membrane dystrophy; ACIOL anterior chamber intraocular lens; IOL intraocular lens; PCIOL posterior chamber intraocular lens; Phaco/IOL phacoemulsification with intraocular lens placement; PRK photorefractive keratectomy; LASIK laser assisted in situ keratomileusis; HTN hypertension; DM diabetes mellitus; COPD chronic obstructive pulmonary disease

## 2023-12-31 ENCOUNTER — Ambulatory Visit (INDEPENDENT_AMBULATORY_CARE_PROVIDER_SITE_OTHER): Payer: Medicare Other | Admitting: Ophthalmology

## 2023-12-31 ENCOUNTER — Encounter (INDEPENDENT_AMBULATORY_CARE_PROVIDER_SITE_OTHER): Payer: Self-pay | Admitting: Ophthalmology

## 2023-12-31 DIAGNOSIS — I10 Essential (primary) hypertension: Secondary | ICD-10-CM

## 2023-12-31 DIAGNOSIS — H35033 Hypertensive retinopathy, bilateral: Secondary | ICD-10-CM | POA: Diagnosis not present

## 2023-12-31 DIAGNOSIS — H25813 Combined forms of age-related cataract, bilateral: Secondary | ICD-10-CM

## 2023-12-31 DIAGNOSIS — Z961 Presence of intraocular lens: Secondary | ICD-10-CM

## 2023-12-31 DIAGNOSIS — H353211 Exudative age-related macular degeneration, right eye, with active choroidal neovascularization: Secondary | ICD-10-CM

## 2023-12-31 DIAGNOSIS — H353231 Exudative age-related macular degeneration, bilateral, with active choroidal neovascularization: Secondary | ICD-10-CM

## 2023-12-31 DIAGNOSIS — H353221 Exudative age-related macular degeneration, left eye, with active choroidal neovascularization: Secondary | ICD-10-CM

## 2023-12-31 MED ORDER — AFLIBERCEPT 2MG/0.05ML IZ SOLN FOR KALEIDOSCOPE
2.0000 mg | INTRAVITREAL | Status: AC | PRN
  Administered 2023-12-31: 2 mg via INTRAVITREAL

## 2024-01-16 NOTE — Progress Notes (Signed)
[facility] - Clinic Note  01/28/2024     CHIEF COMPLAINT Patient presents for Retina Follow Up  HISTORY OF PRESENT ILLNESS: Kimberly Waters is a 78 y.o. female who presents to the clinic today for:  HPI     Retina Follow Up   Patient presents with  Wet AMD.  In both eyes.  This started 4 weeks ago.  Duration of 4 weeks.  Since onset it is stable.  I, the attending physician,  performed the HPI with the patient and updated documentation appropriately.        Comments   4 week retina follow up ARMD and IVE OD pt is reporting no vision changes noticed she denies any flashes or floaters       Last edited by Rennis Chris, MD on 01/28/2024  1:15 PM.    Pt states she got new glasses, but doesn't feel like they have helped her vision   Referring physician: Thana Ates, MD [address] [place],  [place] [address]  HISTORICAL INFORMATION:   Selected notes from the MEDICAL RECORD NUMBER Referred by Dr. Conley Rolls for eval of decreased VA OD.   CURRENT MEDICATIONS: No current outpatient medications on file. (Ophthalmic Drugs)   No current facility-administered medications for this visit. (Ophthalmic Drugs)   Current Outpatient Medications (Other)  Medication Sig   acetaminophen (TYLENOL) 325 MG tablet Take 2 tablets (650 mg total) by mouth every 4 (four) hours as needed for headache or mild pain.   albuterol (PROVENTIL,VENTOLIN) 90 MCG/ACT inhaler Inhale 2 puffs into the lungs every 4 (four) hours as needed. (Patient taking differently: Inhale 2 puffs into the lungs every 4 (four) hours as needed for wheezing or shortness of breath.)   amLODipine (NORVASC) 5 MG tablet Take 1 tablet (5 mg total) by mouth daily.   arformoterol (BROVANA) 15 MCG/2ML NEBU Take 2 mLs (15 mcg total) by nebulization 2 (two) times daily.   betamethasone dipropionate 0.05 % cream 1 application   buPROPion (WELLBUTRIN SR) 150 MG 12 hr tablet Take 150 mg by mouth daily.     clobetasol ointment (TEMOVATE) 0.05 % as needed.   escitalopram (LEXAPRO) 10 MG tablet Take 1 tablet (10 mg total) by mouth daily.   ezetimibe (ZETIA) 10 MG tablet Take 1 tablet (10 mg total) by mouth daily.   furosemide (LASIX) 40 MG tablet Take 1 tablet (40 mg total) by mouth daily.   ipratropium (ATROVENT) 0.06 % nasal spray Place 2 sprays into the nose 4 (four) times daily.   ketoconazole (NIZORAL) 2 % cream Apply 1 application topically daily.   levocetirizine (XYZAL) 5 MG tablet Take 5 mg by mouth as needed for allergies.   levothyroxine (SYNTHROID) 112 MCG tablet Take 112 mcg by mouth daily before breakfast.   melatonin 5 MG TABS Take 1 tablet (5 mg total) by mouth at bedtime.   mometasone (ELOCON) 0.1 % ointment Apply topically as needed.   nitroGLYCERIN (NITROSTAT) 0.4 MG SL tablet Place 0.4 mg under the tongue every 5 (five) minutes as needed for chest pain.   omeprazole (PRILOSEC) 20 MG capsule Take 20 mg by mouth daily.   potassium chloride SA (KLOR-CON M) 20 MEQ tablet Take 2 tablets (40 mEq total) by mouth daily.   revefenacin (YUPELRI) 175 MCG/3ML nebulizer solution Take 3 mLs (175 mcg total) by nebulization daily.   rosuvastatin (CRESTOR) 20 MG tablet Take 1 tablet (20 mg total) by mouth daily.   spironolactone (  ALDACTONE) 25 MG tablet TAKE 1 TABLET BY MOUTH EVERY DAY   tacrolimus (PROTOPIC) 0.1 % ointment Apply topically as needed.   No current facility-administered medications for this visit. (Other)   REVIEW OF SYSTEMS: ROS   Positive for: Neurological, Eyes, Respiratory Negative for: Constitutional, Gastrointestinal, Skin, Genitourinary, Musculoskeletal, HENT, Endocrine, Cardiovascular, Psychiatric, Allergic/Imm, Heme/Lymph Last edited by Etheleen Mayhew, COT on 01/28/2024 12:44 PM.       ALLERGIES Allergies  Allergen Reactions   Latex Other (See Comments)    Other reaction(s): Other (See Comments) Tears skin.   Adhesive [Tape] Other (See Comments)     (skin tears)  Tolerates PAPER TAPE   Codeine Nausea Only   Other Nausea Only    Anesthesia--nausea    PAST MEDICAL HISTORY Past Medical History:  Diagnosis Date   Allergy    Breast cancer (HCC)    Colon polyps    Depression    Dyspnea    SEASONAL   Family history of breast cancer    Family history of colon cancer    GERD (gastroesophageal reflux disease)    History of radiation therapy 06/05/17-07/20/17   right chest wall 50.4 Gy in 28 fractions, right axillary nodal region 45 Gy in 25 fractions   Hyperlipidemia    Hypertension    Hypertensive retinopathy    Hypothyroidism    Macular degeneration    Peripheral neuropathy 06/18/2019   PONV (postoperative nausea and vomiting)    Past Surgical History:  Procedure Laterality Date   ABDOMINAL HYSTERECTOMY     CATARACT EXTRACTION, BILATERAL Bilateral    [facility]   KNEE SURGERY Right    LEFT HEART CATH AND CORONARY ANGIOGRAPHY N/A 02/02/2023   Procedure: LEFT HEART CATH AND CORONARY ANGIOGRAPHY;  Surgeon: Lennette Bihari, MD;  Location: [facility] INVASIVE CV LAB;  Service: Cardiovascular;  Laterality: N/A;   MASTECTOMY W/ SENTINEL NODE BIOPSY Right 03/05/2017   Procedure: BILATERAL TOTAL MASTECTOMIES WITH RIGHT SENTINEL LYMPH NODE BIOPSIES;  Surgeon: Glenna Fellows, MD;  Location: [facility] OR;  Service: General;  Laterality: Right;   SHOULDER SURGERY     BILAT   FAMILY HISTORY Family History  Problem Relation Age of Onset   Heart disease Mother 14       stents   Colon cancer Maternal Grandfather 59   Colon cancer Maternal Aunt 65   Colon cancer Maternal Uncle        dx in his 64s   Breast cancer Paternal Aunt 50   Breast cancer Cousin 35       paternal first cousin   Lung cancer Paternal Grandfather    Colon cancer Maternal Uncle    Head & neck cancer Maternal Uncle        oral cancer   SOCIAL HISTORY Social History   Tobacco Use   Smoking status: Former    Current packs/day: 0.00    Average  packs/day: 1 pack/day for 45.0 years (45.0 ttl pk-yrs)    Types: Cigarettes    Start date: 02/23/1972    Quit date: 02/22/2017    Years since quitting: 6.9   Smokeless tobacco: Never  Vaping Use   Vaping status: Never Used  Substance Use Topics   Alcohol use: No    Alcohol/week: 0.0 standard drinks of alcohol   Drug use: No       OPHTHALMIC EXAM: Base Eye Exam     Visual Acuity (Snellen - Linear)       Right Left  Dist cc 20/60 CF at 3'   Dist ph cc NI NI    Correction: Glasses         Tonometry (Tonopen, 12:50 PM)       Right Left   Pressure 12 16         Pupils       Pupils Dark Light Shape React APD   Right PERRL 3 2 Round Brisk None   Left PERRL 3 2 Round Brisk None         Visual Fields       Left Right    Full Full         Extraocular Movement       Right Left    Full, Ortho Full, Ortho         Neuro/Psych     Oriented x3: Yes   Mood/Affect: Normal         Dilation     Both eyes: 2.5% Phenylephrine @ 12:50 PM           Slit Lamp and Fundus Exam     Slit Lamp Exam       Right Left   Lids/Lashes Dermatochalasis - upper lid Dermatochalasis - upper lid, mild MGD   Conjunctiva/Sclera nasal and temporal pinguecula nasal and temporal pinguecula   Cornea arcus, well healed cataract wound, 2-3+ Punctate epithelial erosions Trace Punctate epithelial erosions, arcus, well healed cataract wound   Anterior Chamber deep, narrow temporal angle, 0.5+ fine cell and pigment deep, narrow temporal angle   Iris Round and moderately dilated, mild anterior bowing Round and moderately dilated, mild anterior bowing   Lens PC IOL in good position PC IOL in good position   Anterior Vitreous mild syneresis, Posterior vitreous detachment mild syneresis, Posterior vitreous detachment         Fundus Exam       Right Left   Disc Pink and Sharp, Compact, mild PPA Mild Pallor, Sharp rim, +elevation   C/D Ratio 0.0 0.1   Macula Blunted foveal  reflex, +CNV with pigment clumping/ring, central IRH - stably improved, central SRF and edema -- improved, RPE mottling and clumping, Drusen, punctate heme on pigment ring -- stably resolved, no heme, +subretinal fibrosis, trace cystic changes Blunted foveal reflex, large area of GA with disciform scar and subretinal fibrosis, central pigment clumping, no heme   Vessels attenuated, Tortuous attenuated, mild tortuosity, mild AV crossing changes   Periphery Attached, scattered reticular degeneration, paving stone degeneration, no heme Attached, scattered reticular degeneration, scattered paving stone degeneration, No heme           Refraction     Wearing Rx       Sphere Cylinder Axis   Right -6.50 +2.00 152   Left -5.25 +1.00 018    Type: SVL           IMAGING AND PROCEDURES  Imaging and Procedures for 01/28/2024  OCT, Retina - OU - Both Eyes       Right Eye Quality was good. Central Foveal Thickness: 285. Progression has been stable. Findings include no IRF, no SRF, abnormal foveal contour, subretinal hyper-reflective material, choroidal neovascular membrane, intraretinal fluid, pigment epithelial detachment, subretinal fluid (Stable improvement in central SRHM and overlying IRF/SRF -- mild cystic changes remain).   Left Eye Quality was good. Central Foveal Thickness: 287. Progression has been stable. Findings include no IRF, no SRF, abnormal foveal contour, subretinal hyper-reflective material, lamellar hole, outer retinal atrophy (large central disciform scar with ORA,  Foveal notch, trace cystic changes and temporal edema -- all stable).   Notes *Images captured and stored on drive  Diagnosis / Impression:  exu ARMD OU OD: Stable improvement in central SRHM and overlying IRF/SRF -- mild cystic changes remain OS: large central disciform scar with ORA, Foveal notch, trace cystic changes and temporal edema -- all stable  Clinical management:  See below  Abbreviations: NFP -  Normal foveal profile. CME - cystoid macular edema. PED - pigment epithelial detachment. IRF - intraretinal fluid. SRF - subretinal fluid. EZ - ellipsoid zone. ERM - epiretinal membrane. ORA - outer retinal atrophy. ORT - outer retinal tubulation. SRHM - subretinal hyper-reflective material. IRHM - intraretinal hyper-reflective material      Intravitreal Injection, Pharmacologic Agent - OD - Right Eye       Time Out 01/28/2024. 1:03 PM. Confirmed correct patient, procedure, site, and patient consented.   Anesthesia Topical anesthesia was used. Anesthetic medications included Lidocaine 2%, Proparacaine 0.5%.   Procedure Preparation included 5% betadine to ocular surface, eyelid speculum. A (32g) needle was used.   Injection: 2 mg aflibercept 2 MG/0.05ML   Route: Intravitreal, Site: Right Eye   NDC: L6038910, Lot: [id number], Expiration date: 05/24/2025, Waste: 0 mL   Post-op Post injection exam found visual acuity of at least counting fingers. The patient tolerated the procedure well. There were no complications. The patient received written and verbal post procedure care education. Post injection medications were not given.            ASSESSMENT/PLAN:   ICD-10-CM   1. Exudative age-related macular degeneration of right eye with active choroidal neovascularization (HCC)  H35.3211 OCT, Retina - OU - Both Eyes    Intravitreal Injection, Pharmacologic Agent - OD - Right Eye    aflibercept (EYLEA) SOLN 2 mg    2. Exudative age-related macular degeneration of left eye with active choroidal neovascularization (HCC)  H35.3221 OCT, Retina - OU - Both Eyes    3. Essential hypertension  I10     4. Hypertensive retinopathy of both eyes  H35.033     5. Pseudophakia, both eyes  Z96.1      1. Exudative age related macular degeneration OD  - delayed f/u (11.20.24 to 01.06.25): 6.5 wks instead of 4 -- death in family - lost to follow up from 8 weeks to 7 months (12.19.23 to 07.02.24)  due to cardiac issues -- vision decreased to 20/100 OD on 07.02.24  - pt initially presented w/ 2 wk history of decreased vision OD (12.21.2021) - history of intravitreal injections OS with Dr. Luciana Axe in 2017 and earlier -- pt reports stopping visits after the development of a corneal abrasion from a lid speculum. - s/p IVA OD #1 (12.21.21), #2 (01.24.22), #3 (2.21.22), #4 (03.22.22), #5 (4.26.22), #6 (5.31.22), #7 (7.12.22), #8 (8.16.22), #9 (9.20.22), #10 (10.25.22), #11 (12.6.22), #12 (01.10.23), #13 (02.14.23), #14 (03.21.23), #15 (04.25.23), #16 06.06.23), #17 (08.01.23), #18 (09.12.23), #19 (10.31.23), #20 (12.19.23)  - s/p IVE OD #1 (07.02.24), #2 (07.30.24),#3 (08.27.24), #4 (09.25.24), #5 (10.23.24), #6 (11.20.24), #7 (01.06.25)  **history of increased SRF at 6 wk interval, noted on 12.6.22**  **history of increased SRF at 8 wk interval, noted on 08.01.23** - OCT shows stable improvement in central [facility] and overlying IRF/SRF -- mild trace cystic changes remain at 4 weeks  - BCVA OD 20/60 -- stable             - Recommend IVE OD #8 today, 02.03.25 w/ f/u extended to 5  wks  - RBA of procedure discussed, questions answered  - IVE informed consent obtained and signed, 07.02.24 (OD)  - see procedure note  - f/u in 5 wks -- DFE/OCT, possible injection   2. Exudative age related macular degeneration, OS - OS with history inactive disciform scar, but new focal IRH noted 5.31.22 -- stably resolved on exam today - s/p IVA OS #1 (05.31.22), #2 (07.12.22), #3 (8.15.22), #4 (9.20.22) for focal [facility] - history of intravitreal injections OS with Dr. Luciana Axe in 2017 and earlier -- pt reports stopping visits after the development of a corneal abrasion from a lid speculum  - before being referred here, had not seen a retina specialist since 2017  - exam and OCT OS shows stable disciform scar -- no IRF/SRF  - BCVA CF OS -- stable - recommend holding IVA OS today - f/u 4 weeks, DFE, OCT  3,4. Hypertensive  retinopathy OU - discussed importance of tight BP control - monitor  5. Pseudophakia OU  - s/p CE/IOL (Dr. Zenaida Niece)  - IOL in good position, doing well  - monitor  Ophthalmic Meds Ordered this visit:  Meds ordered this encounter  Medications   aflibercept (EYLEA) SOLN 2 mg     Return in about 5 weeks (around 03/03/2024) for f/u exu ARMD OU, DFE, OCT, Possible Injxn.  There are no Patient Instructions on file for this visit.  This document serves as a record of services personally performed by Karie Chimera, MD, PhD. It was created on their behalf by Glee Arvin. Manson Passey, OA an ophthalmic technician. The creation of this record is the provider's dictation and/or activities during the visit.    Electronically signed by: Glee Arvin. Manson Passey, OA 01/28/24 1:17 PM   Karie Chimera, M.D., Ph.D. Diseases & Surgery of the Retina and Vitreous [facility]  I have reviewed the above documentation for accuracy and completeness, and I agree with the above. Karie Chimera, M.D., Ph.D. 01/28/24 1:17 PM    Abbreviations: M myopia (nearsighted); A astigmatism; H hyperopia (farsighted); P presbyopia; Mrx spectacle prescription;  CTL contact lenses; OD right eye; OS left eye; OU both eyes  XT exotropia; ET esotropia; PEK punctate epithelial keratitis; PEE punctate epithelial erosions; DES dry eye syndrome; MGD meibomian gland dysfunction; ATs artificial tears; PFAT's preservative free artificial tears; NSC nuclear sclerotic cataract; PSC posterior subcapsular cataract; ERM epi-retinal membrane; PVD posterior vitreous detachment; RD retinal detachment; DM diabetes mellitus; DR diabetic retinopathy; NPDR non-proliferative diabetic retinopathy; PDR proliferative diabetic retinopathy; CSME clinically significant macular edema; DME diabetic macular edema; dbh dot blot hemorrhages; CWS cotton wool spot; POAG primary open angle glaucoma; C/D cup-to-disc ratio; HVF humphrey visual field; GVF  goldmann visual field; OCT optical coherence tomography; IOP intraocular pressure; BRVO Branch retinal vein occlusion; CRVO central retinal vein occlusion; CRAO central retinal artery occlusion; BRAO branch retinal artery occlusion; RT retinal tear; SB scleral buckle; PPV pars plana vitrectomy; VH Vitreous hemorrhage; PRP panretinal laser photocoagulation; IVK intravitreal kenalog; VMT vitreomacular traction; MH Macular hole;  NVD neovascularization of the disc; NVE neovascularization elsewhere; AREDS age related eye disease study; ARMD age related macular degeneration; POAG primary open angle glaucoma; EBMD epithelial/anterior basement membrane dystrophy; ACIOL anterior chamber intraocular lens; IOL intraocular lens; PCIOL posterior chamber intraocular lens; Phaco/IOL phacoemulsification with intraocular lens placement; PRK photorefractive keratectomy; LASIK laser assisted in situ keratomileusis; HTN hypertension; DM diabetes mellitus; COPD chronic obstructive pulmonary disease

## 2024-01-28 ENCOUNTER — Ambulatory Visit (INDEPENDENT_AMBULATORY_CARE_PROVIDER_SITE_OTHER): Payer: Medicare Other | Admitting: Ophthalmology

## 2024-01-28 ENCOUNTER — Encounter (INDEPENDENT_AMBULATORY_CARE_PROVIDER_SITE_OTHER): Payer: Self-pay | Admitting: Ophthalmology

## 2024-01-28 DIAGNOSIS — H353231 Exudative age-related macular degeneration, bilateral, with active choroidal neovascularization: Secondary | ICD-10-CM

## 2024-01-28 DIAGNOSIS — Z961 Presence of intraocular lens: Secondary | ICD-10-CM | POA: Diagnosis not present

## 2024-01-28 DIAGNOSIS — H35033 Hypertensive retinopathy, bilateral: Secondary | ICD-10-CM

## 2024-01-28 DIAGNOSIS — I10 Essential (primary) hypertension: Secondary | ICD-10-CM

## 2024-01-28 DIAGNOSIS — H353221 Exudative age-related macular degeneration, left eye, with active choroidal neovascularization: Secondary | ICD-10-CM

## 2024-01-28 DIAGNOSIS — H353211 Exudative age-related macular degeneration, right eye, with active choroidal neovascularization: Secondary | ICD-10-CM

## 2024-01-28 MED ORDER — AFLIBERCEPT 2MG/0.05ML IZ SOLN FOR KALEIDOSCOPE
2.0000 mg | INTRAVITREAL | Status: AC | PRN
  Administered 2024-01-28: 2 mg via INTRAVITREAL

## 2024-02-01 DIAGNOSIS — H903 Sensorineural hearing loss, bilateral: Secondary | ICD-10-CM | POA: Diagnosis not present

## 2024-02-04 DIAGNOSIS — H903 Sensorineural hearing loss, bilateral: Secondary | ICD-10-CM | POA: Diagnosis not present

## 2024-02-19 NOTE — Progress Notes (Signed)
 [facility] - Clinic Note  03/04/2024     CHIEF COMPLAINT Patient presents for Retina Follow Up  HISTORY OF PRESENT ILLNESS: Kimberly Waters is a 78 y.o. female who presents to the clinic today for:  HPI     Retina Follow Up   Patient presents with  Wet AMD.  In both eyes.  This started 5 weeks ago.  I, the attending physician,  performed the HPI with the patient and updated documentation appropriately.        Comments   Patient here for 5 weeks retina follow up for exu ARMD OU. Patient states vision about the same. No worse. No better. No eye pain.       Last edited by Rennis Chris, MD on 03/04/2024  5:48 PM.     Pt states her vision is "about the same"   Referring physician: Thana Ates, MD [address] [place],  [place] [address]  HISTORICAL INFORMATION:   Selected notes from the MEDICAL RECORD NUMBER Referred by Dr. Conley Rolls for eval of decreased VA OD.   CURRENT MEDICATIONS: No current outpatient medications on file. (Ophthalmic Drugs)   No current facility-administered medications for this visit. (Ophthalmic Drugs)   Current Outpatient Medications (Other)  Medication Sig   acetaminophen (TYLENOL) 325 MG tablet Take 2 tablets (650 mg total) by mouth every 4 (four) hours as needed for headache or mild pain.   albuterol (PROVENTIL,VENTOLIN) 90 MCG/ACT inhaler Inhale 2 puffs into the lungs every 4 (four) hours as needed. (Patient taking differently: Inhale 2 puffs into the lungs every 4 (four) hours as needed for wheezing or shortness of breath.)   amLODipine (NORVASC) 5 MG tablet Take 1 tablet (5 mg total) by mouth daily.   arformoterol (BROVANA) 15 MCG/2ML NEBU Take 2 mLs (15 mcg total) by nebulization 2 (two) times daily.   betamethasone dipropionate 0.05 % cream 1 application   buPROPion (WELLBUTRIN SR) 150 MG 12 hr tablet Take 150 mg by mouth daily.    clobetasol ointment (TEMOVATE) 0.05 % as needed.   escitalopram (LEXAPRO) 10 MG  tablet Take 1 tablet (10 mg total) by mouth daily.   ezetimibe (ZETIA) 10 MG tablet Take 1 tablet (10 mg total) by mouth daily.   furosemide (LASIX) 40 MG tablet Take 1 tablet (40 mg total) by mouth daily.   ipratropium (ATROVENT) 0.06 % nasal spray Place 2 sprays into the nose 4 (four) times daily.   ketoconazole (NIZORAL) 2 % cream Apply 1 application topically daily.   levocetirizine (XYZAL) 5 MG tablet Take 5 mg by mouth as needed for allergies.   levothyroxine (SYNTHROID) 112 MCG tablet Take 112 mcg by mouth daily before breakfast.   melatonin 5 MG TABS Take 1 tablet (5 mg total) by mouth at bedtime.   mometasone (ELOCON) 0.1 % ointment Apply topically as needed.   nitroGLYCERIN (NITROSTAT) 0.4 MG SL tablet Place 0.4 mg under the tongue every 5 (five) minutes as needed for chest pain.   omeprazole (PRILOSEC) 20 MG capsule Take 20 mg by mouth daily.   potassium chloride SA (KLOR-CON M) 20 MEQ tablet Take 2 tablets (40 mEq total) by mouth daily.   revefenacin (YUPELRI) 175 MCG/3ML nebulizer solution Take 3 mLs (175 mcg total) by nebulization daily.   rosuvastatin (CRESTOR) 20 MG tablet Take 1 tablet (20 mg total) by mouth daily.   spironolactone (ALDACTONE) 25 MG tablet TAKE 1 TABLET BY MOUTH EVERY DAY   tacrolimus (  PROTOPIC) 0.1 % ointment Apply topically as needed.   No current facility-administered medications for this visit. (Other)   REVIEW OF SYSTEMS: ROS   Positive for: Neurological, Eyes, Respiratory Negative for: Constitutional, Gastrointestinal, Skin, Genitourinary, Musculoskeletal, HENT, Endocrine, Cardiovascular, Psychiatric, Allergic/Imm, Heme/Lymph Last edited by Laddie Aquas, COA on 03/04/2024 12:47 PM.        ALLERGIES Allergies  Allergen Reactions   Latex Other (See Comments)    Other reaction(s): Other (See Comments) Tears skin.   Adhesive [Tape] Other (See Comments)    (skin tears)  Tolerates PAPER TAPE   Codeine Nausea Only   Other Nausea Only     Anesthesia--nausea    PAST MEDICAL HISTORY Past Medical History:  Diagnosis Date   Allergy    Breast cancer (HCC)    Colon polyps    Depression    Dyspnea    SEASONAL   Family history of breast cancer    Family history of colon cancer    GERD (gastroesophageal reflux disease)    History of radiation therapy 06/05/17-07/20/17   right chest wall 50.4 Gy in 28 fractions, right axillary nodal region 45 Gy in 25 fractions   Hyperlipidemia    Hypertension    Hypertensive retinopathy    Hypothyroidism    Macular degeneration    Peripheral neuropathy 06/18/2019   PONV (postoperative nausea and vomiting)    Past Surgical History:  Procedure Laterality Date   ABDOMINAL HYSTERECTOMY     CATARACT EXTRACTION, BILATERAL Bilateral    [facility]   KNEE SURGERY Right    LEFT HEART CATH AND CORONARY ANGIOGRAPHY N/A 02/02/2023   Procedure: LEFT HEART CATH AND CORONARY ANGIOGRAPHY;  Surgeon: Lennette Bihari, MD;  Location: [facility] INVASIVE CV LAB;  Service: Cardiovascular;  Laterality: N/A;   MASTECTOMY W/ SENTINEL NODE BIOPSY Right 03/05/2017   Procedure: BILATERAL TOTAL MASTECTOMIES WITH RIGHT SENTINEL LYMPH NODE BIOPSIES;  Surgeon: Glenna Fellows, MD;  Location: [facility] OR;  Service: General;  Laterality: Right;   SHOULDER SURGERY     BILAT   FAMILY HISTORY Family History  Problem Relation Age of Onset   Heart disease Mother 89       stents   Colon cancer Maternal Grandfather 59   Colon cancer Maternal Aunt 65   Colon cancer Maternal Uncle        dx in his 53s   Breast cancer Paternal Aunt 46   Breast cancer Cousin 91       paternal first cousin   Lung cancer Paternal Grandfather    Colon cancer Maternal Uncle    Head & neck cancer Maternal Uncle        oral cancer   SOCIAL HISTORY Social History   Tobacco Use   Smoking status: Former    Current packs/day: 0.00    Average packs/day: 1 pack/day for 45.0 years (45.0 ttl pk-yrs)    Types: Cigarettes    Start  date: 02/23/1972    Quit date: 02/22/2017    Years since quitting: 7.0   Smokeless tobacco: Never  Vaping Use   Vaping status: Never Used  Substance Use Topics   Alcohol use: No    Alcohol/week: 0.0 standard drinks of alcohol   Drug use: No       OPHTHALMIC EXAM: Base Eye Exam     Visual Acuity (Snellen - Linear)       Right Left   Dist cc 20/50 -1 CF at 3'   Dist ph cc NI  NI    Correction: Glasses         Tonometry (Tonopen, 12:45 PM)       Right Left   Pressure 13 16         Pupils       Dark Light Shape React APD   Right 3 2 Round Brisk None   Left 3 2 Round Brisk None         Visual Fields (Counting fingers)       Left Right    Full Full         Extraocular Movement       Right Left    Full, Ortho Full, Ortho         Neuro/Psych     Oriented x3: Yes   Mood/Affect: Normal         Dilation     Both eyes: 1.0% Mydriacyl, 2.5% Phenylephrine @ 12:45 PM           Slit Lamp and Fundus Exam     Slit Lamp Exam       Right Left   Lids/Lashes Dermatochalasis - upper lid Dermatochalasis - upper lid, mild MGD   Conjunctiva/Sclera nasal and temporal pinguecula nasal and temporal pinguecula   Cornea arcus, well healed cataract wound, 2-3+ Punctate epithelial erosions Trace Punctate epithelial erosions, arcus, well healed cataract wound   Anterior Chamber deep, narrow temporal angle, 0.5+ fine cell and pigment deep, narrow temporal angle   Iris Round and moderately dilated, mild anterior bowing Round and moderately dilated, mild anterior bowing   Lens PC IOL in good position PC IOL in good position   Anterior Vitreous mild syneresis, Posterior vitreous detachment mild syneresis, Posterior vitreous detachment         Fundus Exam       Right Left   Disc Pink and Sharp, Compact, mild PPA Mild Pallor, Sharp rim, +elevation   C/D Ratio 0.0 0.1   Macula Blunted foveal reflex, +CNV with pigment clumping/ring, central IRH - stably improved,  central SRF and edema -- improved, RPE mottling and clumping, Drusen, punctate heme on pigment ring -- stably resolved, no heme, +subretinal fibrosis, trace cystic changes Blunted foveal reflex, large area of GA with disciform scar and subretinal fibrosis, central pigment clumping, no heme   Vessels attenuated, Tortuous attenuated, mild tortuosity, mild AV crossing changes   Periphery Attached, scattered reticular degeneration, paving stone degeneration, no heme Attached, scattered reticular degeneration, scattered paving stone degeneration, No heme           Refraction     Wearing Rx       Sphere Cylinder Axis   Right -6.50 +2.00 152   Left -5.25 +1.00 018    Type: SVL           IMAGING AND PROCEDURES  Imaging and Procedures for 03/04/2024  OCT, Retina - OU - Both Eyes       Right Eye Quality was good. Central Foveal Thickness: 286. Progression has been stable. Findings include no IRF, no SRF, abnormal foveal contour, subretinal hyper-reflective material, choroidal neovascular membrane, intraretinal fluid, pigment epithelial detachment, subretinal fluid (Stable improvement in central SRHM and overlying IRF/SRF -- mild cystic changes remain).   Left Eye Quality was good. Central Foveal Thickness: 272. Progression has been stable. Findings include no IRF, no SRF, abnormal foveal contour, subretinal hyper-reflective material, lamellar hole, outer retinal atrophy (large central disciform scar with ORA, Foveal notch, trace cystic changes and temporal edema -- all stable).  Notes *Images captured and stored on drive  Diagnosis / Impression:  exu ARMD OU OD: Stable improvement in central SRHM and overlying IRF/SRF -- mild cystic changes remain OS: large central disciform scar with ORA, Foveal notch, trace cystic changes and temporal edema -- all stable  Clinical management:  See below  Abbreviations: NFP - Normal foveal profile. CME - cystoid macular edema. PED - pigment  epithelial detachment. IRF - intraretinal fluid. SRF - subretinal fluid. EZ - ellipsoid zone. ERM - epiretinal membrane. ORA - outer retinal atrophy. ORT - outer retinal tubulation. SRHM - subretinal hyper-reflective material. IRHM - intraretinal hyper-reflective material      Intravitreal Injection, Pharmacologic Agent - OD - Right Eye       Time Out 03/04/2024. 1:16 PM. Confirmed correct patient, procedure, site, and patient consented.   Anesthesia Topical anesthesia was used. Anesthetic medications included Lidocaine 2%, Proparacaine 0.5%.   Procedure Preparation included 5% betadine to ocular surface, eyelid speculum. A (32g) needle was used.   Injection: 2 mg aflibercept 2 MG/0.05ML   Route: Intravitreal, Site: Right Eye   NDC: L6038910, Lot: [id number], Expiration date: 05/23/2025, Waste: 0 mL   Post-op Post injection exam found visual acuity of at least counting fingers. The patient tolerated the procedure well. There were no complications. The patient received written and verbal post procedure care education. Post injection medications were not given.             ASSESSMENT/PLAN:   ICD-10-CM   1. Exudative age-related macular degeneration of right eye with active choroidal neovascularization (HCC)  H35.3211 OCT, Retina - OU - Both Eyes    Intravitreal Injection, Pharmacologic Agent - OD - Right Eye    aflibercept (EYLEA) SOLN 2 mg    2. Exudative age-related macular degeneration of left eye with active choroidal neovascularization (HCC)  H35.3221     3. Essential hypertension  I10     4. Hypertensive retinopathy of both eyes  H35.033     5. Pseudophakia, both eyes  Z96.1       1. Exudative age related macular degeneration OD  - delayed f/u (11.20.24 to 01.06.25): 6.5 wks instead of 4 -- death in family - lost to follow up from 8 weeks to 7 months (12.19.23 to 07.02.24) due to cardiac issues -- vision decreased to 20/100 OD on 07.02.24  - pt initially  presented w/ 2 wk history of decreased vision OD (12.21.2021) - history of intravitreal injections OS with Dr. Luciana Axe in 2017 and earlier -- pt reports stopping visits after the development of a corneal abrasion from a lid speculum. - s/p IVA OD #1 (12.21.21), #2 (01.24.22), #3 (2.21.22), #4 (03.22.22), #5 (4.26.22), #6 (5.31.22), #7 (7.12.22), #8 (8.16.22), #9 (9.20.22), #10 (10.25.22), #11 (12.6.22), #12 (01.10.23), #13 (02.14.23), #14 (03.21.23), #15 (04.25.23), #16 06.06.23), #17 (08.01.23), #18 (09.12.23), #19 (10.31.23), #20 (12.19.23)  - s/p IVE OD #1 (07.02.24), #2 (07.30.24),#3 (08.27.24), #4 (09.25.24), #5 (10.23.24), #6 (11.20.24), #7 (01.06.25), #8 (02.03.25)  **history of increased SRF at 6 wk interval, noted on 12.6.22**  **history of increased SRF at 8 wk interval, noted on 08.01.23** - OCT shows stable improvement in central [facility] and overlying IRF/SRF -- mild trace cystic changes remain at 5 weeks  - BCVA OD 20/50 -- improved from 20/60             - Recommend IVE OD #9 today, 03.11.25 w/ f/u in 5 wks  - RBA of procedure discussed, questions answered  - IVE informed consent  obtained and signed, 07.02.24 (OD)  - see procedure note  - f/u in 5 wks -- DFE/OCT, possible injection   2. Exudative age related macular degeneration, OS - OS with history inactive disciform scar, but new focal IRH noted 5.31.22 -- stably resolved on exam today - s/p IVA OS #1 (05.31.22), #2 (07.12.22), #3 (8.15.22), #4 (9.20.22) for focal [facility] - history of intravitreal injections OS with Dr. Luciana Axe in 2017 and earlier -- pt reports stopping visits after the development of a corneal abrasion from a lid speculum  - before being referred here, had not seen a retina specialist since 2017  - exam and OCT OS shows stable disciform scar -- no IRF/SRF  - BCVA CF OS -- stable - recommend holding IVA OS today - f/u 4 weeks, DFE, OCT  3,4. Hypertensive retinopathy OU - discussed importance of tight BP control -  monitor  5. Pseudophakia OU  - s/p CE/IOL (Dr. Zenaida Niece)  - IOL in good position, doing well  - monitor  Ophthalmic Meds Ordered this visit:  Meds ordered this encounter  Medications   aflibercept (EYLEA) SOLN 2 mg     Return in about 5 weeks (around 04/08/2024) for f/u exu ARMD OD, DFE, OCT.  There are no Patient Instructions on file for this visit.  This document serves as a record of services personally performed by Karie Chimera, MD, PhD. It was created on their behalf by Charlette Caffey, COT an ophthalmic technician. The creation of this record is the provider's dictation and/or activities during the visit.    Electronically signed by:  Charlette Caffey, COT  03/04/24 5:50 PM  This document serves as a record of services personally performed by Karie Chimera, MD, PhD. It was created on their behalf by Glee Arvin. Manson Passey, OA an ophthalmic technician. The creation of this record is the provider's dictation and/or activities during the visit.    Electronically signed by: Glee Arvin. Manson Passey, OA 03/04/24 5:50 PM  Karie Chimera, M.D., Ph.D. Diseases & Surgery of the Retina and Vitreous [facility]  I have reviewed the above documentation for accuracy and completeness, and I agree with the above. Karie Chimera, M.D., Ph.D. 03/04/24 5:51 PM    Abbreviations: M myopia (nearsighted); A astigmatism; H hyperopia (farsighted); P presbyopia; Mrx spectacle prescription;  CTL contact lenses; OD right eye; OS left eye; OU both eyes  XT exotropia; ET esotropia; PEK punctate epithelial keratitis; PEE punctate epithelial erosions; DES dry eye syndrome; MGD meibomian gland dysfunction; ATs artificial tears; PFAT's preservative free artificial tears; NSC nuclear sclerotic cataract; PSC posterior subcapsular cataract; ERM epi-retinal membrane; PVD posterior vitreous detachment; RD retinal detachment; DM diabetes mellitus; DR diabetic retinopathy; NPDR non-proliferative  diabetic retinopathy; PDR proliferative diabetic retinopathy; CSME clinically significant macular edema; DME diabetic macular edema; dbh dot blot hemorrhages; CWS cotton wool spot; POAG primary open angle glaucoma; C/D cup-to-disc ratio; HVF humphrey visual field; GVF goldmann visual field; OCT optical coherence tomography; IOP intraocular pressure; BRVO Branch retinal vein occlusion; CRVO central retinal vein occlusion; CRAO central retinal artery occlusion; BRAO branch retinal artery occlusion; RT retinal tear; SB scleral buckle; PPV pars plana vitrectomy; VH Vitreous hemorrhage; PRP panretinal laser photocoagulation; IVK intravitreal kenalog; VMT vitreomacular traction; MH Macular hole;  NVD neovascularization of the disc; NVE neovascularization elsewhere; AREDS age related eye disease study; ARMD age related macular degeneration; POAG primary open angle glaucoma; EBMD epithelial/anterior basement membrane dystrophy; ACIOL anterior chamber intraocular lens; IOL  intraocular lens; PCIOL posterior chamber intraocular lens; Phaco/IOL phacoemulsification with intraocular lens placement; PRK photorefractive keratectomy; LASIK laser assisted in situ keratomileusis; HTN hypertension; DM diabetes mellitus; COPD chronic obstructive pulmonary disease

## 2024-03-04 ENCOUNTER — Ambulatory Visit (INDEPENDENT_AMBULATORY_CARE_PROVIDER_SITE_OTHER): Payer: Medicare Other | Admitting: Ophthalmology

## 2024-03-04 ENCOUNTER — Encounter (INDEPENDENT_AMBULATORY_CARE_PROVIDER_SITE_OTHER): Payer: Self-pay | Admitting: Ophthalmology

## 2024-03-04 DIAGNOSIS — H353231 Exudative age-related macular degeneration, bilateral, with active choroidal neovascularization: Secondary | ICD-10-CM

## 2024-03-04 DIAGNOSIS — I10 Essential (primary) hypertension: Secondary | ICD-10-CM | POA: Diagnosis not present

## 2024-03-04 DIAGNOSIS — H353221 Exudative age-related macular degeneration, left eye, with active choroidal neovascularization: Secondary | ICD-10-CM

## 2024-03-04 DIAGNOSIS — H35033 Hypertensive retinopathy, bilateral: Secondary | ICD-10-CM

## 2024-03-04 DIAGNOSIS — Z961 Presence of intraocular lens: Secondary | ICD-10-CM

## 2024-03-04 DIAGNOSIS — H353211 Exudative age-related macular degeneration, right eye, with active choroidal neovascularization: Secondary | ICD-10-CM

## 2024-03-04 MED ORDER — AFLIBERCEPT 2MG/0.05ML IZ SOLN FOR KALEIDOSCOPE
2.0000 mg | INTRAVITREAL | Status: AC | PRN
Start: 2024-03-04 — End: 2024-03-04
  Administered 2024-03-04: 2 mg via INTRAVITREAL

## 2024-03-14 ENCOUNTER — Other Ambulatory Visit: Payer: Self-pay | Admitting: Internal Medicine

## 2024-03-31 NOTE — Progress Notes (Signed)
 [facility] - Clinic Note  04/08/2024     CHIEF COMPLAINT Patient presents for Retina Follow Up  HISTORY OF PRESENT ILLNESS: Kimberly Waters is a 78 y.o. female who presents to the clinic today for:  HPI     Retina Follow Up   Patient presents with  Wet AMD.  In right eye.  This started 5 weeks ago.  I, the attending physician,  performed the HPI with the patient and updated documentation appropriately.        Comments   Patient here for 5 weeks retina follow up for exu ARMD OD. Patient states vision about the same. No changes noticed. No eye pain. OS sometimes has like something white or silver passes over then gone. Like a flash of light.      Last edited by Ronelle Coffee, MD on 04/08/2024  5:27 PM.    Pt states her vision is stable   Referring physician: Tena Feeling, MD [address] [place],  [place] [address]  HISTORICAL INFORMATION:   Selected notes from the MEDICAL RECORD NUMBER Referred by Dr. Buck Carbon for eval of decreased VA OD.   CURRENT MEDICATIONS: No current outpatient medications on file. (Ophthalmic Drugs)   No current facility-administered medications for this visit. (Ophthalmic Drugs)   Current Outpatient Medications (Other)  Medication Sig   acetaminophen (TYLENOL) 325 MG tablet Take 2 tablets (650 mg total) by mouth every 4 (four) hours as needed for headache or mild pain.   albuterol (PROVENTIL,VENTOLIN) 90 MCG/ACT inhaler Inhale 2 puffs into the lungs every 4 (four) hours as needed. (Patient taking differently: Inhale 2 puffs into the lungs every 4 (four) hours as needed for wheezing or shortness of breath.)   amLODipine (NORVASC) 5 MG tablet Take 1 tablet (5 mg total) by mouth daily.   arformoterol (BROVANA) 15 MCG/2ML NEBU Take 2 mLs (15 mcg total) by nebulization 2 (two) times daily.   betamethasone dipropionate 0.05 % cream 1 application   buPROPion (WELLBUTRIN SR) 150 MG 12 hr tablet Take 150 mg by mouth daily.     clobetasol ointment (TEMOVATE) 0.05 % as needed.   escitalopram (LEXAPRO) 10 MG tablet Take 1 tablet (10 mg total) by mouth daily.   ezetimibe (ZETIA) 10 MG tablet Take 1 tablet (10 mg total) by mouth daily.   furosemide (LASIX) 40 MG tablet Take 1 tablet (40 mg total) by mouth daily.   ipratropium (ATROVENT) 0.06 % nasal spray Place 2 sprays into the nose 4 (four) times daily.   ketoconazole (NIZORAL) 2 % cream Apply 1 application topically daily.   levocetirizine (XYZAL) 5 MG tablet Take 5 mg by mouth as needed for allergies.   levothyroxine (SYNTHROID) 112 MCG tablet Take 112 mcg by mouth daily before breakfast.   melatonin 5 MG TABS Take 1 tablet (5 mg total) by mouth at bedtime.   mometasone (ELOCON) 0.1 % ointment Apply topically as needed.   nitroGLYCERIN (NITROSTAT) 0.4 MG SL tablet Place 0.4 mg under the tongue every 5 (five) minutes as needed for chest pain.   omeprazole (PRILOSEC) 20 MG capsule Take 20 mg by mouth daily.   potassium chloride SA (KLOR-CON M) 20 MEQ tablet Take 2 tablets (40 mEq total) by mouth daily.   revefenacin (YUPELRI) 175 MCG/3ML nebulizer solution Take 3 mLs (175 mcg total) by nebulization daily.   rosuvastatin (CRESTOR) 20 MG tablet Take 1 tablet (20 mg total) by mouth daily.   spironolactone (ALDACTONE) 25  MG tablet TAKE 1 TABLET BY MOUTH EVERY DAY   tacrolimus (PROTOPIC) 0.1 % ointment Apply topically as needed.   No current facility-administered medications for this visit. (Other)   REVIEW OF SYSTEMS: ROS   Positive for: Neurological, Eyes, Respiratory Negative for: Constitutional, Gastrointestinal, Skin, Genitourinary, Musculoskeletal, HENT, Endocrine, Cardiovascular, Psychiatric, Allergic/Imm, Heme/Lymph Last edited by Sylvan Evener, COA on 04/08/2024 12:50 PM.         ALLERGIES Allergies  Allergen Reactions   Latex Other (See Comments)    Other reaction(s): Other (See Comments) Tears skin.   Adhesive [Tape] Other (See Comments)     (skin tears)  Tolerates PAPER TAPE   Codeine Nausea Only   Other Nausea Only    Anesthesia--nausea    PAST MEDICAL HISTORY Past Medical History:  Diagnosis Date   Allergy    Breast cancer (HCC)    Colon polyps    Depression    Dyspnea    SEASONAL   Family history of breast cancer    Family history of colon cancer    GERD (gastroesophageal reflux disease)    History of radiation therapy 06/05/17-07/20/17   right chest wall 50.4 Gy in 28 fractions, right axillary nodal region 45 Gy in 25 fractions   Hyperlipidemia    Hypertension    Hypertensive retinopathy    Hypothyroidism    Macular degeneration    Peripheral neuropathy 06/18/2019   PONV (postoperative nausea and vomiting)    Past Surgical History:  Procedure Laterality Date   ABDOMINAL HYSTERECTOMY     CATARACT EXTRACTION, BILATERAL Bilateral    [facility]   KNEE SURGERY Right    LEFT HEART CATH AND CORONARY ANGIOGRAPHY N/A 02/02/2023   Procedure: LEFT HEART CATH AND CORONARY ANGIOGRAPHY;  Surgeon: Millicent Ally, MD;  Location: [facility] INVASIVE CV [facility];  Service: Cardiovascular;  Laterality: N/A;   MASTECTOMY W/ SENTINEL NODE BIOPSY Right 03/05/2017   Procedure: BILATERAL TOTAL MASTECTOMIES WITH RIGHT SENTINEL LYMPH NODE BIOPSIES;  Surgeon: Ayesha Lente, MD;  Location: [facility] OR;  Service: General;  Laterality: Right;   SHOULDER SURGERY     BILAT   FAMILY HISTORY Family History  Problem Relation Age of Onset   Heart disease Mother 21       stents   Colon cancer Maternal Grandfather 59   Colon cancer Maternal Aunt 65   Colon cancer Maternal Uncle        dx in his 57s   Breast cancer Paternal Aunt 14   Breast cancer Cousin 29       paternal first cousin   Lung cancer Paternal Grandfather    Colon cancer Maternal Uncle    Head & neck cancer Maternal Uncle        oral cancer   SOCIAL HISTORY Social History   Tobacco Use   Smoking status: Former    Current packs/day: 0.00    Average  packs/day: 1 pack/day for 45.0 years (45.0 ttl pk-yrs)    Types: Cigarettes    Start date: 02/23/1972    Quit date: 02/22/2017    Years since quitting: 7.1   Smokeless tobacco: Never  Vaping Use   Vaping status: Never Used  Substance Use Topics   Alcohol use: No    Alcohol/week: 0.0 standard drinks of alcohol   Drug use: No       OPHTHALMIC EXAM: Base Eye Exam     Visual Acuity (Snellen - Linear)       Right Left  Dist cc 20/50 -1 CF at 3'    Correction: Glasses         Tonometry (Tonopen, 12:47 PM)       Right Left   Pressure 16 16         Pupils       Dark Light Shape React APD   Right 3 2 Round Brisk None   Left 3 2 Round Brisk None         Visual Fields (Counting fingers)       Left Right    Full Full         Extraocular Movement       Right Left    Full, Ortho Full, Ortho         Neuro/Psych     Oriented x3: Yes   Mood/Affect: Normal         Dilation     Both eyes: 1.0% Mydriacyl, 2.5% Phenylephrine @ 12:47 PM           Slit Lamp and Fundus Exam     Slit Lamp Exam       Right Left   Lids/Lashes Dermatochalasis - upper lid Dermatochalasis - upper lid, mild MGD   Conjunctiva/Sclera nasal and temporal pinguecula nasal and temporal pinguecula   Cornea arcus, well healed cataract wound, 2-3+ Punctate epithelial erosions Trace Punctate epithelial erosions, arcus, well healed cataract wound   Anterior Chamber deep, narrow temporal angle, 0.5+ fine cell and pigment deep, narrow temporal angle   Iris Round and moderately dilated, mild anterior bowing Round and moderately dilated, mild anterior bowing   Lens PC IOL in good position PC IOL in good position   Anterior Vitreous mild syneresis, Posterior vitreous detachment mild syneresis, Posterior vitreous detachment         Fundus Exam       Right Left   Disc Pink and Sharp, Compact, mild PPA Mild Pallor, Sharp rim, +elevation   C/D Ratio 0.0 0.1   Macula Blunted foveal reflex,  +CNV with pigment clumping/ring, central IRH - stably improved, central SRF and edema -- improved, RPE mottling and clumping, Drusen, punctate heme on pigment ring -- stably resolved, no heme, +subretinal fibrosis, trace cystic changes Blunted foveal reflex, large area of GA with disciform scar and subretinal fibrosis, central pigment clumping, no heme   Vessels attenuated, Tortuous attenuated, mild tortuosity, mild AV crossing changes   Periphery Attached, scattered reticular degeneration, paving stone degeneration, no heme Attached, scattered reticular degeneration, scattered paving stone degeneration, No heme           Refraction     Wearing Rx       Sphere Cylinder Axis   Right -6.50 +2.00 152   Left -5.25 +1.00 018    Type: SVL           IMAGING AND PROCEDURES  Imaging and Procedures for 04/08/2024  OCT, Retina - OU - Both Eyes       Right Eye Quality was good. Central Foveal Thickness: 285. Progression has been stable. Findings include no IRF, no SRF, abnormal foveal contour, subretinal hyper-reflective material, choroidal neovascular membrane, intraretinal fluid, pigment epithelial detachment, subretinal fluid (persistent SRHM and overlying IRF -- minimal improvement).   Left Eye Quality was good. Central Foveal Thickness: 271. Progression has been stable. Findings include no IRF, no SRF, abnormal foveal contour, subretinal hyper-reflective material, lamellar hole, outer retinal atrophy (large central disciform scar with ORA, Foveal notch, trace cystic changes and temporal edema -- all  stable).   Notes *Images captured and stored on drive  Diagnosis / Impression:  exu ARMD OU OD: persistent SRHM and overlying IRF -- minimal improvement OS: large central disciform scar with ORA, Foveal notch, trace cystic changes and temporal edema -- all stable  Clinical management:  See below  Abbreviations: NFP - Normal foveal profile. CME - cystoid macular edema. PED - pigment  epithelial detachment. IRF - intraretinal fluid. SRF - subretinal fluid. EZ - ellipsoid zone. ERM - epiretinal membrane. ORA - outer retinal atrophy. ORT - outer retinal tubulation. SRHM - subretinal hyper-reflective material. IRHM - intraretinal hyper-reflective material      Intravitreal Injection, Pharmacologic Agent - OD - Right Eye       Time Out 04/08/2024. 1:27 PM. Confirmed correct patient, procedure, site, and patient consented.   Anesthesia Topical anesthesia was used. Anesthetic medications included Lidocaine 2%, Proparacaine 0.5%.   Procedure Preparation included 5% betadine to ocular surface, eyelid speculum. A (32g) needle was used.   Injection: 6 mg faricimab-svoa 6 MG/0.05ML   Route: Intravitreal, Site: Right Eye   NDC: [id number], Lot: [id number], Expiration date: 04/23/2025, Waste: 0 mL   Post-op Post injection exam found visual acuity of at least counting fingers. The patient tolerated the procedure well. There were no complications. The patient received written and verbal post procedure care education. Post injection medications were not given.            ASSESSMENT/PLAN:   ICD-10-CM   1. Exudative age-related macular degeneration of right eye with active choroidal neovascularization (HCC)  H35.3211 OCT, Retina - OU - Both Eyes    Intravitreal Injection, Pharmacologic Agent - OD - Right Eye    faricimab-svoa (VABYSMO) 6mg /0.18mL intravitreal injection    2. Exudative age-related macular degeneration of left eye with active choroidal neovascularization (HCC)  H35.3221     3. Essential hypertension  I10     4. Hypertensive retinopathy of both eyes  H35.033     5. Pseudophakia, both eyes  Z96.1        1. Exudative age related macular degeneration OD  - delayed f/u (11.20.24 to 01.06.25): 6.5 wks instead of 4 -- death in family - lost to follow up from 8 weeks to 7 months (12.19.23 to 07.02.24) due to cardiac issues -- vision decreased to 20/100 OD on  07.02.24  - pt initially presented w/ 2 wk history of decreased vision OD (12.21.2021) - history of intravitreal injections OS with Dr. Seward Dao in 2017 and earlier -- pt reports stopping visits after the development of a corneal abrasion from a lid speculum. - s/p IVA OD #1 (12.21.21), #2 (01.24.22), #3 (2.21.22), #4 (03.22.22), #5 (4.26.22), #6 (5.31.22), #7 (7.12.22), #8 (8.16.22), #9 (9.20.22), #10 (10.25.22), #11 (12.6.22), #12 (01.10.23), #13 (02.14.23), #14 (03.21.23), #15 (04.25.23), #16 06.06.23), #17 (08.01.23), #18 (09.12.23), #19 (10.31.23), #20 (12.19.23) -- IVA resistance  - s/p IVE OD #1 (07.02.24), #2 (07.30.24),#3 (08.27.24), #4 (09.25.24), #5 (10.23.24), #6 (11.20.24), #7 (01.06.25), #8 (02.03.25), #9 (03.11.25) -- IVE resistance  **history of increased SRF at 6 wk interval, noted on 12.6.22**  **history of increased SRF at 8 wk interval, noted on 08.01.23** - OCT shows persistent SRHM and overlying IRF -- minimal improvement at 5 weeks  - BCVA OD stable at 20/50 **discussed decreased efficacy / resistance to Eylea and potential benefit of switching medication**              - Recommend switching to IVV OD #1 today, 04.15.25 w/ f/u in 5  wks again  - RBA of procedure discussed, questions answered  - IVE informed consent obtained and signed, 07.02.24 (OD)  - see procedure note  - f/u in 5 wks -- DFE/OCT, possible injection   2. Exudative age related macular degeneration, OS - OS with history inactive disciform scar, but new focal IRH noted 5.31.22 -- stably resolved on exam today - s/p IVA OS #1 (05.31.22), #2 (07.12.22), #3 (8.15.22), #4 (9.20.22) for focal [facility] - history of intravitreal injections OS with Dr. Seward Dao in 2017 and earlier -- pt reports stopping visits after the development of a corneal abrasion from a lid speculum  - before being referred here, had not seen a retina specialist since 2017  - exam and OCT OS shows stable disciform scar -- no IRF/SRF  - BCVA CF OS --  stable - recommend holding IVA OS today - f/u 5 weeks, DFE, OCT  3,4. Hypertensive retinopathy OU - discussed importance of tight BP control - monitor  5. Pseudophakia OU  - s/p CE/IOL (Dr. Carloyn Chi)  - IOL in good position, doing well  - monitor  Ophthalmic Meds Ordered this visit:  Meds ordered this encounter  Medications   faricimab-svoa (VABYSMO) 6mg /0.73mL intravitreal injection     Return in about 5 weeks (around 05/13/2024) for f/u exu ARMD OD, DFE, OCT, Possible Injxn.  There are no Patient Instructions on file for this visit.  This document serves as a record of services personally performed by Jeanice Millard, MD, PhD. It was created on their behalf by Olene Berne, COT an ophthalmic technician. The creation of this record is the provider's dictation and/or activities during the visit.    Electronically signed by:  Olene Berne, COT  04/08/24 5:28 PM  This document serves as a record of services personally performed by Jeanice Millard, MD, PhD. It was created on their behalf by Morley Arabia. Bevin Bucks, OA an ophthalmic technician. The creation of this record is the provider's dictation and/or activities during the visit.    Electronically signed by: Morley Arabia. Bevin Bucks, OA 04/08/24 5:28 PM  Jeanice Millard, M.D., Ph.D. Diseases & Surgery of the Retina and Vitreous [facility]  I have reviewed the above documentation for accuracy and completeness, and I agree with the above. Jeanice Millard, M.D., Ph.D. 04/08/24 5:29 PM   Abbreviations: M myopia (nearsighted); A astigmatism; H hyperopia (farsighted); P presbyopia; Mrx spectacle prescription;  CTL contact lenses; OD right eye; OS left eye; OU both eyes  XT exotropia; ET esotropia; PEK punctate epithelial keratitis; PEE punctate epithelial erosions; DES dry eye syndrome; MGD meibomian gland dysfunction; ATs artificial tears; PFAT's preservative free artificial tears; NSC nuclear sclerotic cataract; PSC  posterior subcapsular cataract; ERM epi-retinal membrane; PVD posterior vitreous detachment; RD retinal detachment; DM diabetes mellitus; DR diabetic retinopathy; NPDR non-proliferative diabetic retinopathy; PDR proliferative diabetic retinopathy; CSME clinically significant macular edema; DME diabetic macular edema; dbh dot blot hemorrhages; CWS cotton wool spot; POAG primary open angle glaucoma; C/D cup-to-disc ratio; HVF humphrey visual field; GVF goldmann visual field; OCT optical coherence tomography; IOP intraocular pressure; BRVO Branch retinal vein occlusion; CRVO central retinal vein occlusion; CRAO central retinal artery occlusion; BRAO branch retinal artery occlusion; RT retinal tear; SB scleral buckle; PPV pars plana vitrectomy; VH Vitreous hemorrhage; PRP panretinal laser photocoagulation; IVK intravitreal kenalog; VMT vitreomacular traction; MH Macular hole;  NVD neovascularization of the disc; NVE neovascularization elsewhere; AREDS age related eye disease study; ARMD age related macular degeneration;  POAG primary open angle glaucoma; EBMD epithelial/anterior basement membrane dystrophy; ACIOL anterior chamber intraocular lens; IOL intraocular lens; PCIOL posterior chamber intraocular lens; Phaco/IOL phacoemulsification with intraocular lens placement; PRK photorefractive keratectomy; LASIK laser assisted in situ keratomileusis; HTN hypertension; DM diabetes mellitus; COPD chronic obstructive pulmonary disease

## 2024-04-08 ENCOUNTER — Encounter (INDEPENDENT_AMBULATORY_CARE_PROVIDER_SITE_OTHER): Payer: Self-pay | Admitting: Ophthalmology

## 2024-04-08 ENCOUNTER — Ambulatory Visit (INDEPENDENT_AMBULATORY_CARE_PROVIDER_SITE_OTHER): Admitting: Ophthalmology

## 2024-04-08 DIAGNOSIS — J439 Emphysema, unspecified: Secondary | ICD-10-CM | POA: Diagnosis not present

## 2024-04-08 DIAGNOSIS — I10 Essential (primary) hypertension: Secondary | ICD-10-CM

## 2024-04-08 DIAGNOSIS — H353231 Exudative age-related macular degeneration, bilateral, with active choroidal neovascularization: Secondary | ICD-10-CM

## 2024-04-08 DIAGNOSIS — H353211 Exudative age-related macular degeneration, right eye, with active choroidal neovascularization: Secondary | ICD-10-CM

## 2024-04-08 DIAGNOSIS — I700 Atherosclerosis of aorta: Secondary | ICD-10-CM | POA: Diagnosis not present

## 2024-04-08 DIAGNOSIS — H353221 Exudative age-related macular degeneration, left eye, with active choroidal neovascularization: Secondary | ICD-10-CM

## 2024-04-08 DIAGNOSIS — Z961 Presence of intraocular lens: Secondary | ICD-10-CM | POA: Diagnosis not present

## 2024-04-08 DIAGNOSIS — N1832 Chronic kidney disease, stage 3b: Secondary | ICD-10-CM | POA: Diagnosis not present

## 2024-04-08 DIAGNOSIS — H35033 Hypertensive retinopathy, bilateral: Secondary | ICD-10-CM

## 2024-04-08 MED ORDER — FARICIMAB-SVOA 6 MG/0.05ML IZ SOSY
6.0000 mg | PREFILLED_SYRINGE | INTRAVITREAL | Status: AC | PRN
  Administered 2024-04-08: 6 mg via INTRAVITREAL

## 2024-04-15 DIAGNOSIS — M25511 Pain in right shoulder: Secondary | ICD-10-CM | POA: Diagnosis not present

## 2024-04-16 ENCOUNTER — Other Ambulatory Visit (HOSPITAL_COMMUNITY): Payer: Self-pay | Admitting: Physician Assistant

## 2024-04-16 DIAGNOSIS — M25511 Pain in right shoulder: Secondary | ICD-10-CM

## 2024-04-17 ENCOUNTER — Ambulatory Visit (HOSPITAL_COMMUNITY)
Admission: RE | Admit: 2024-04-17 | Discharge: 2024-04-17 | Disposition: A | Discharge status: Home / Self Care | Source: Ambulatory Visit | Attending: Physician Assistant | Admitting: Physician Assistant

## 2024-04-17 DIAGNOSIS — M7581 Other shoulder lesions, right shoulder: Secondary | ICD-10-CM | POA: Diagnosis not present

## 2024-04-17 DIAGNOSIS — S43431A Superior glenoid labrum lesion of right shoulder, initial encounter: Secondary | ICD-10-CM | POA: Diagnosis not present

## 2024-04-17 DIAGNOSIS — M25511 Pain in right shoulder: Secondary | ICD-10-CM | POA: Diagnosis not present

## 2024-04-17 DIAGNOSIS — M25411 Effusion, right shoulder: Secondary | ICD-10-CM | POA: Diagnosis not present

## 2024-04-17 DIAGNOSIS — M19011 Primary osteoarthritis, right shoulder: Secondary | ICD-10-CM | POA: Diagnosis not present

## 2024-04-23 DIAGNOSIS — I700 Atherosclerosis of aorta: Secondary | ICD-10-CM | POA: Diagnosis not present

## 2024-04-23 DIAGNOSIS — N1832 Chronic kidney disease, stage 3b: Secondary | ICD-10-CM | POA: Diagnosis not present

## 2024-04-23 DIAGNOSIS — I5022 Chronic systolic (congestive) heart failure: Secondary | ICD-10-CM | POA: Diagnosis not present

## 2024-04-23 DIAGNOSIS — I10 Essential (primary) hypertension: Secondary | ICD-10-CM | POA: Diagnosis not present

## 2024-04-23 DIAGNOSIS — J439 Emphysema, unspecified: Secondary | ICD-10-CM | POA: Diagnosis not present

## 2024-04-23 DIAGNOSIS — Z853 Personal history of malignant neoplasm of breast: Secondary | ICD-10-CM | POA: Diagnosis not present

## 2024-04-28 ENCOUNTER — Other Ambulatory Visit: Payer: Self-pay | Admitting: Internal Medicine

## 2024-05-01 NOTE — Progress Notes (Signed)
 [facility] - Clinic Note  05/13/2024     CHIEF COMPLAINT Patient presents for Retina Follow Up  HISTORY OF PRESENT ILLNESS: Kimberly Waters is a 78 y.o. female who presents to the clinic today for:  HPI     Retina Follow Up   Patient presents with  Wet AMD.  In right eye.  This started 5 weeks ago.  I, the attending physician,  performed the HPI with the patient and updated documentation appropriately.        Comments   Patient feels the vision is the same. She is not using eye drops at this time.       Last edited by Ronelle Coffee, MD on 05/13/2024  5:06 PM.    Pt states she's developed neuropathy in her hands   Referring physician: Tena Feeling, MD [address] [place],  [place] [address]  HISTORICAL INFORMATION:   Selected notes from the MEDICAL RECORD NUMBER Referred by Dr. Buck Carbon for eval of decreased VA OD.   CURRENT MEDICATIONS: No current outpatient medications on file. (Ophthalmic Drugs)   No current facility-administered medications for this visit. (Ophthalmic Drugs)   Current Outpatient Medications (Other)  Medication Sig   acetaminophen  (TYLENOL ) 325 MG tablet Take 2 tablets (650 mg total) by mouth every 4 (four) hours as needed for headache or mild pain.   albuterol  (PROVENTIL ,VENTOLIN ) 90 MCG/ACT inhaler Inhale 2 puffs into the lungs every 4 (four) hours as needed. (Patient taking differently: Inhale 2 puffs into the lungs every 4 (four) hours as needed for wheezing or shortness of breath.)   amLODipine  (NORVASC ) 5 MG tablet Take 1 tablet (5 mg total) by mouth daily.   arformoterol  (BROVANA ) 15 MCG/2ML NEBU Take 2 mLs (15 mcg total) by nebulization 2 (two) times daily.   betamethasone  dipropionate 0.05 % cream 1 application   buPROPion  (WELLBUTRIN  SR) 150 MG 12 hr tablet Take 150 mg by mouth daily.    clobetasol  ointment (TEMOVATE ) 0.05 % as needed.   escitalopram  (LEXAPRO ) 10 MG tablet Take 1 tablet (10 mg total) by mouth  daily.   ezetimibe  (ZETIA ) 10 MG tablet Take 1 tablet (10 mg total) by mouth daily.   furosemide  (LASIX ) 40 MG tablet Take 1 tablet (40 mg total) by mouth daily.   ipratropium (ATROVENT ) 0.06 % nasal spray Place 2 sprays into the nose 4 (four) times daily.   ketoconazole  (NIZORAL ) 2 % cream Apply 1 application topically daily.   levocetirizine (XYZAL ) 5 MG tablet Take 5 mg by mouth as needed for allergies.   levothyroxine  (SYNTHROID ) 112 MCG tablet Take 112 mcg by mouth daily before breakfast.   melatonin 5 MG TABS Take 1 tablet (5 mg total) by mouth at bedtime.   mometasone  (ELOCON ) 0.1 % ointment Apply topically as needed.   nitroGLYCERIN  (NITROSTAT ) 0.4 MG SL tablet Place 0.4 mg under the tongue every 5 (five) minutes as needed for chest pain.   omeprazole  (PRILOSEC) 20 MG capsule Take 20 mg by mouth daily.   potassium chloride  SA (KLOR-CON  M) 20 MEQ tablet TAKE 2 TABLETS BY MOUTH DAILY   revefenacin  (YUPELRI ) 175 MCG/3ML nebulizer solution Take 3 mLs (175 mcg total) by nebulization daily.   rosuvastatin  (CRESTOR ) 20 MG tablet Take 1 tablet (20 mg total) by mouth daily.   spironolactone  (ALDACTONE ) 25 MG tablet TAKE 1 TABLET BY MOUTH EVERY DAY   tacrolimus (PROTOPIC) 0.1 % ointment Apply topically as needed.   No current facility-administered  medications for this visit. (Other)   REVIEW OF SYSTEMS: ROS   Positive for: Neurological, Eyes, Respiratory Negative for: Constitutional, Gastrointestinal, Skin, Genitourinary, Musculoskeletal, HENT, Endocrine, Cardiovascular, Psychiatric, Allergic/Imm, Heme/Lymph Last edited by Olene Berne, COT on 05/13/2024 12:31 PM.     ALLERGIES Allergies  Allergen Reactions   Latex Other (See Comments)    Other reaction(s): Other (See Comments) Tears skin.   Adhesive [Tape] Other (See Comments)    (skin tears)  Tolerates PAPER TAPE   Codeine  Nausea Only   Other Nausea Only    Anesthesia--nausea    PAST MEDICAL HISTORY Past Medical History:   Diagnosis Date   Allergy    Breast cancer (HCC)    Colon polyps    Depression    Dyspnea    SEASONAL   Family history of breast cancer    Family history of colon cancer    GERD (gastroesophageal reflux disease)    History of radiation therapy 06/05/17-07/20/17   right chest wall 50.4 Gy in 28 fractions, right axillary nodal region 45 Gy in 25 fractions   Hyperlipidemia    Hypertension    Hypertensive retinopathy    Hypothyroidism    Macular degeneration    Peripheral neuropathy 06/18/2019   PONV (postoperative nausea and vomiting)    Past Surgical History:  Procedure Laterality Date   ABDOMINAL HYSTERECTOMY     CATARACT EXTRACTION, BILATERAL Bilateral    [facility]   KNEE SURGERY Right    LEFT HEART CATH AND CORONARY ANGIOGRAPHY N/A 02/02/2023   Procedure: LEFT HEART CATH AND CORONARY ANGIOGRAPHY;  Surgeon: Millicent Ally, MD;  Location: [facility] INVASIVE CV LAB;  Service: Cardiovascular;  Laterality: N/A;   MASTECTOMY W/ SENTINEL NODE BIOPSY Right 03/05/2017   Procedure: BILATERAL TOTAL MASTECTOMIES WITH RIGHT SENTINEL LYMPH NODE BIOPSIES;  Surgeon: Ayesha Lente, MD;  Location: [facility] OR;  Service: General;  Laterality: Right;   SHOULDER SURGERY     BILAT   FAMILY HISTORY Family History  Problem Relation Age of Onset   Heart disease Mother 90       stents   Colon cancer Maternal Grandfather 59   Colon cancer Maternal Aunt 65   Colon cancer Maternal Uncle        dx in his 47s   Breast cancer Paternal Aunt 75   Breast cancer Cousin 51       paternal first cousin   Lung cancer Paternal Grandfather    Colon cancer Maternal Uncle    Head & neck cancer Maternal Uncle        oral cancer   SOCIAL HISTORY Social History   Tobacco Use   Smoking status: Former    Current packs/day: 0.00    Average packs/day: 1 pack/day for 45.0 years (45.0 ttl pk-yrs)    Types: Cigarettes    Start date: 02/23/1972    Quit date: 02/22/2017    Years since quitting: 7.2    Smokeless tobacco: Never  Vaping Use   Vaping status: Never Used  Substance Use Topics   Alcohol use: No    Alcohol/week: 0.0 standard drinks of alcohol   Drug use: No       OPHTHALMIC EXAM: Base Eye Exam     Visual Acuity (Snellen - Linear)       Right Left   Dist cc 20/50 CF at 3'   Dist ph cc NI NI    Correction: Glasses         Tonometry (Tonopen, 12:33  PM)       Right Left   Pressure 18 20         Pupils       Dark Light Shape React APD   Right 3 2 Round Brisk None   Left 3 2 Round Brisk None         Visual Fields       Left Right    Full Full         Extraocular Movement       Right Left    Full, Ortho Full, Ortho         Neuro/Psych     Oriented x3: Yes   Mood/Affect: Normal         Dilation     Both eyes: 1.0% Mydriacyl, 2.5% Phenylephrine  @ 12:31 PM           Slit Lamp and Fundus Exam     Slit Lamp Exam       Right Left   Lids/Lashes Dermatochalasis - upper lid Dermatochalasis - upper lid, mild MGD   Conjunctiva/Sclera nasal and temporal pinguecula nasal and temporal pinguecula   Cornea arcus, well healed cataract wound, 2-3+ Punctate epithelial erosions Trace Punctate epithelial erosions, arcus, well healed cataract wound   Anterior Chamber deep, narrow temporal angle, 0.5+ fine cell and pigment deep, narrow temporal angle   Iris Round and moderately dilated, mild anterior bowing Round and moderately dilated, mild anterior bowing   Lens PC IOL in good position PC IOL in good position   Anterior Vitreous mild syneresis, Posterior vitreous detachment mild syneresis, Posterior vitreous detachment         Fundus Exam       Right Left   Disc Pink and Sharp, Compact, mild PPA Mild Pallor, Sharp rim, +elevation   C/D Ratio 0.0 0.1   Macula Blunted foveal reflex, +CNV with pigment clumping/ring, central IRH - stably improved, central SRF and edema -- improved, RPE mottling and clumping, Drusen, punctate heme on pigment  ring -- stably resolved, no heme, +subretinal fibrosis, trace cystic changes -- persistent Blunted foveal reflex, large area of GA with disciform scar and subretinal fibrosis, central pigment clumping, no heme   Vessels attenuated, Tortuous attenuated, mild tortuosity, mild AV crossing changes   Periphery Attached, scattered reticular degeneration, paving stone degeneration, no heme Attached, scattered reticular degeneration, scattered paving stone degeneration, No heme           Refraction     Wearing Rx       Sphere Cylinder Axis   Right -6.50 +2.00 152   Left -5.25 +1.00 018    Type: SVL           IMAGING AND PROCEDURES  Imaging and Procedures for 05/13/2024  OCT, Retina - OU - Both Eyes        Right Eye Quality was good. Central Foveal Thickness: 285. Progression has been stable. Findings include no IRF, no SRF, abnormal foveal contour, subretinal hyper-reflective material, choroidal neovascular membrane, intraretinal fluid, pigment epithelial detachment, subretinal fluid (persistent IRF / cystic changes overlying central SRHM/CNV).   Left Eye Quality was good. Central Foveal Thickness: 331. Progression has been stable. Findings include no IRF, no SRF, abnormal foveal contour, subretinal hyper-reflective material, lamellar hole, outer retinal atrophy (large central disciform scar with ORA, Foveal notch, trace cystic changes and temporal edema -- all stable).   Notes  *Images captured and stored on drive  Diagnosis / Impression:  exu ARMD OU OD: persistent IRF /  cystic changes overlying central SRHM/CNV OS: large central disciform scar with ORA, Foveal notch, trace cystic changes and temporal edema -- all stable  Clinical management:  See below  Abbreviations: NFP - Normal foveal profile. CME - cystoid macular edema. PED - pigment epithelial detachment. IRF - intraretinal fluid. SRF - subretinal fluid. EZ - ellipsoid zone. ERM - epiretinal membrane. ORA - outer  retinal atrophy. ORT - outer retinal tubulation. SRHM - subretinal hyper-reflective material. IRHM - intraretinal hyper-reflective material      Intravitreal Injection, Pharmacologic Agent - OD - Right Eye       Time Out 05/13/2024. 1:07 PM. Confirmed correct patient, procedure, site, and patient consented.   Anesthesia Topical anesthesia was used. Anesthetic medications included Lidocaine  2%, Proparacaine 0.5%.   Procedure Preparation included 5% betadine to ocular surface, eyelid speculum. A supplied (32g) needle was used.   Injection: 6 mg faricimab -svoa 6 MG/0.05ML   Route: Intravitreal, Site: Right Eye   NDC: 01027-253-66, Lot: [id number], Expiration date: 05/24/2025, Waste: 0 mL   Post-op Post injection exam found visual acuity of at least counting fingers. The patient tolerated the procedure well. There were no complications. The patient received written and verbal post procedure care education. Post injection medications were not given.            ASSESSMENT/PLAN:   ICD-10-CM   1. Exudative age-related macular degeneration of right eye with active choroidal neovascularization (HCC)  H35.3211 OCT, Retina - OU - Both Eyes    Intravitreal Injection, Pharmacologic Agent - OD - Right Eye    faricimab -svoa (VABYSMO ) 6mg /0.90mL intravitreal injection    2. Exudative age-related macular degeneration of left eye with active choroidal neovascularization (HCC)  H35.3221     3. Essential hypertension  I10     4. Hypertensive retinopathy of both eyes  H35.033     5. Pseudophakia, both eyes  Z96.1      1. Exudative age related macular degeneration OD  - delayed f/u (11.20.24 to 01.06.25): 6.5 wks instead of 4 -- death in family - lost to follow up from 8 weeks to 7 months (12.19.23 to 07.02.24) due to cardiac issues -- vision decreased to 20/100 OD on 07.02.24  - pt initially presented w/ 2 wk history of decreased vision OD (12.21.2021) - history of intravitreal injections OS  with Dr. Seward Dao in 2017 and earlier -- pt reports stopping visits after the development of a corneal abrasion from a lid speculum. - s/p IVA OD #1 (12.21.21), #2 (01.24.22), #3 (2.21.22), #4 (03.22.22), #5 (4.26.22), #6 (5.31.22), #7 (7.12.22), #8 (8.16.22), #9 (9.20.22), #10 (10.25.22), #11 (12.6.22), #12 (01.10.23), #13 (02.14.23), #14 (03.21.23), #15 (04.25.23), #16 06.06.23), #17 (08.01.23), #18 (09.12.23), #19 (10.31.23), #20 (12.19.23) -- IVA resistance  - s/p IVE OD #1 (07.02.24), #2 (07.30.24),#3 (08.27.24), #4 (09.25.24), #5 (10.23.24), #6 (11.20.24), #7 (01.06.25), #8 (02.03.25), #9 (03.11.25) -- IVE resistance  - s/p IVV OD #1 (04.15.25)  **history of increased SRF at 6 wk interval, noted on 12.6.22**  **history of increased SRF at 8 wk interval, noted on 08.01.23** - OCT shows persistent IRF / cystic changes overlying central SRHM/CNV at 5 weeks  - BCVA OD stable at 20/50             - Recommend IVV OD #2 today, 05.20.25 w/ f/u in 5 wks again  - RBA of procedure discussed, questions answered  - IVE informed consent obtained and signed, 07.02.24 (OD)  - see procedure note  - f/u in 5 wks --  DFE/OCT, possible injection   2. Exudative age related macular degeneration, OS - OS with history inactive disciform scar, but new focal IRH noted 5.31.22 -- stably resolved on exam today - s/p IVA OS #1 (05.31.22), #2 (07.12.22), #3 (8.15.22), #4 (9.20.22) for focal [facility] - history of intravitreal injections OS with Dr. Seward Dao in 2017 and earlier -- pt reports stopping visits after the development of a corneal abrasion from a lid speculum  - before being referred here, had not seen a retina specialist since 2017  - exam and OCT OS shows large central disciform scar with ORA, Foveal notch, trace cystic changes and temporal edema -- all stable   - BCVA CF OS -- stable - recommend holding IVA OS today - f/u 5 weeks, DFE, OCT  3,4. Hypertensive retinopathy OU - discussed importance of tight BP  control - monitor  5. Pseudophakia OU  - s/p CE/IOL (Dr. Carloyn Chi)  - IOL in good position, doing well  - monitor  Ophthalmic Meds Ordered this visit:  Meds ordered this encounter  Medications   faricimab -svoa (VABYSMO ) 6mg /0.28mL intravitreal injection     Return in about 5 weeks (around 06/17/2024), or f/u exu ARMD OU, for DFE, OCT, Possible Injxn.  There are no Patient Instructions on file for this visit.  This document serves as a record of services personally performed by Jeanice Millard, MD, PhD. It was created on their behalf by Olene Berne, COT an ophthalmic technician. The creation of this record is the provider's dictation and/or activities during the visit.    Electronically signed by:  Olene Berne, COT  05/18/24 12:54 AM  This document serves as a record of services personally performed by Jeanice Millard, MD, PhD. It was created on their behalf by Morley Arabia. Bevin Bucks, OA an ophthalmic technician. The creation of this record is the provider's dictation and/or activities during the visit.    Electronically signed by: Morley Arabia. Bevin Bucks, OA 05/18/24 12:54 AM  Jeanice Millard, M.D., Ph.D. Diseases & Surgery of the Retina and Vitreous [facility]  I have reviewed the above documentation for accuracy and completeness, and I agree with the above. Jeanice Millard, M.D., Ph.D. 05/18/24 12:57 AM   Abbreviations: M myopia (nearsighted); A astigmatism; H hyperopia (farsighted); P presbyopia; Mrx spectacle prescription;  CTL contact lenses; OD right eye; OS left eye; OU both eyes  XT exotropia; ET esotropia; PEK punctate epithelial keratitis; PEE punctate epithelial erosions; DES dry eye syndrome; MGD meibomian gland dysfunction; ATs artificial tears; PFAT's preservative free artificial tears; NSC nuclear sclerotic cataract; PSC posterior subcapsular cataract; ERM epi-retinal membrane; PVD posterior vitreous detachment; RD retinal detachment; DM diabetes  mellitus; DR diabetic retinopathy; NPDR non-proliferative diabetic retinopathy; PDR proliferative diabetic retinopathy; CSME clinically significant macular edema; DME diabetic macular edema; dbh dot blot hemorrhages; CWS cotton wool spot; POAG primary open angle glaucoma; C/D cup-to-disc ratio; HVF humphrey visual field; GVF goldmann visual field; OCT optical coherence tomography; IOP intraocular pressure; BRVO Branch retinal vein occlusion; CRVO central retinal vein occlusion; CRAO central retinal artery occlusion; BRAO branch retinal artery occlusion; RT retinal tear; SB scleral buckle; PPV pars plana vitrectomy; VH Vitreous hemorrhage; PRP panretinal laser photocoagulation; IVK intravitreal kenalog; VMT vitreomacular traction; MH Macular hole;  NVD neovascularization of the disc; NVE neovascularization elsewhere; AREDS age related eye disease study; ARMD age related macular degeneration; POAG primary open angle glaucoma; EBMD epithelial/anterior basement membrane dystrophy; ACIOL anterior chamber intraocular lens; IOL intraocular lens; PCIOL  posterior chamber intraocular lens; Phaco/IOL phacoemulsification with intraocular lens placement; PRK photorefractive keratectomy; LASIK laser assisted in situ keratomileusis; HTN hypertension; DM diabetes mellitus; COPD chronic obstructive pulmonary disease

## 2024-05-07 DIAGNOSIS — J439 Emphysema, unspecified: Secondary | ICD-10-CM | POA: Diagnosis not present

## 2024-05-07 DIAGNOSIS — I10 Essential (primary) hypertension: Secondary | ICD-10-CM | POA: Diagnosis not present

## 2024-05-07 DIAGNOSIS — N1832 Chronic kidney disease, stage 3b: Secondary | ICD-10-CM | POA: Diagnosis not present

## 2024-05-07 DIAGNOSIS — I700 Atherosclerosis of aorta: Secondary | ICD-10-CM | POA: Diagnosis not present

## 2024-05-13 ENCOUNTER — Encounter (INDEPENDENT_AMBULATORY_CARE_PROVIDER_SITE_OTHER): Payer: Self-pay | Admitting: Ophthalmology

## 2024-05-13 ENCOUNTER — Ambulatory Visit (INDEPENDENT_AMBULATORY_CARE_PROVIDER_SITE_OTHER): Admitting: Ophthalmology

## 2024-05-13 DIAGNOSIS — Z961 Presence of intraocular lens: Secondary | ICD-10-CM

## 2024-05-13 DIAGNOSIS — H35033 Hypertensive retinopathy, bilateral: Secondary | ICD-10-CM | POA: Diagnosis not present

## 2024-05-13 DIAGNOSIS — H353211 Exudative age-related macular degeneration, right eye, with active choroidal neovascularization: Secondary | ICD-10-CM

## 2024-05-13 DIAGNOSIS — H353231 Exudative age-related macular degeneration, bilateral, with active choroidal neovascularization: Secondary | ICD-10-CM | POA: Diagnosis not present

## 2024-05-13 DIAGNOSIS — I10 Essential (primary) hypertension: Secondary | ICD-10-CM

## 2024-05-13 DIAGNOSIS — H353221 Exudative age-related macular degeneration, left eye, with active choroidal neovascularization: Secondary | ICD-10-CM

## 2024-05-13 MED ORDER — FARICIMAB-SVOA 6 MG/0.05ML IZ SOSY
6.0000 mg | PREFILLED_SYRINGE | INTRAVITREAL | Status: AC | PRN
  Administered 2024-05-13: 6 mg via INTRAVITREAL

## 2024-05-24 DIAGNOSIS — I700 Atherosclerosis of aorta: Secondary | ICD-10-CM | POA: Diagnosis not present

## 2024-05-24 DIAGNOSIS — I5022 Chronic systolic (congestive) heart failure: Secondary | ICD-10-CM | POA: Diagnosis not present

## 2024-05-24 DIAGNOSIS — I10 Essential (primary) hypertension: Secondary | ICD-10-CM | POA: Diagnosis not present

## 2024-05-24 DIAGNOSIS — Z853 Personal history of malignant neoplasm of breast: Secondary | ICD-10-CM | POA: Diagnosis not present

## 2024-05-24 DIAGNOSIS — N1832 Chronic kidney disease, stage 3b: Secondary | ICD-10-CM | POA: Diagnosis not present

## 2024-05-24 DIAGNOSIS — J439 Emphysema, unspecified: Secondary | ICD-10-CM | POA: Diagnosis not present

## 2024-05-28 DIAGNOSIS — N1832 Chronic kidney disease, stage 3b: Secondary | ICD-10-CM | POA: Diagnosis not present

## 2024-05-28 DIAGNOSIS — E039 Hypothyroidism, unspecified: Secondary | ICD-10-CM | POA: Diagnosis not present

## 2024-05-28 DIAGNOSIS — M542 Cervicalgia: Secondary | ICD-10-CM | POA: Diagnosis not present

## 2024-05-28 DIAGNOSIS — E669 Obesity, unspecified: Secondary | ICD-10-CM | POA: Diagnosis not present

## 2024-05-28 DIAGNOSIS — E559 Vitamin D deficiency, unspecified: Secondary | ICD-10-CM | POA: Diagnosis not present

## 2024-05-28 DIAGNOSIS — Z79899 Other long term (current) drug therapy: Secondary | ICD-10-CM | POA: Diagnosis not present

## 2024-05-28 DIAGNOSIS — J439 Emphysema, unspecified: Secondary | ICD-10-CM | POA: Diagnosis not present

## 2024-05-28 DIAGNOSIS — I5022 Chronic systolic (congestive) heart failure: Secondary | ICD-10-CM | POA: Diagnosis not present

## 2024-05-28 DIAGNOSIS — R7303 Prediabetes: Secondary | ICD-10-CM | POA: Diagnosis not present

## 2024-05-28 DIAGNOSIS — F322 Major depressive disorder, single episode, severe without psychotic features: Secondary | ICD-10-CM | POA: Diagnosis not present

## 2024-05-28 DIAGNOSIS — Z0000 Encounter for general adult medical examination without abnormal findings: Secondary | ICD-10-CM | POA: Diagnosis not present

## 2024-05-28 DIAGNOSIS — Z1331 Encounter for screening for depression: Secondary | ICD-10-CM | POA: Diagnosis not present

## 2024-05-28 DIAGNOSIS — E7800 Pure hypercholesterolemia, unspecified: Secondary | ICD-10-CM | POA: Diagnosis not present

## 2024-05-28 DIAGNOSIS — I10 Essential (primary) hypertension: Secondary | ICD-10-CM | POA: Diagnosis not present

## 2024-05-28 DIAGNOSIS — Z853 Personal history of malignant neoplasm of breast: Secondary | ICD-10-CM | POA: Diagnosis not present

## 2024-05-29 LAB — LAB REPORT - SCANNED: EGFR: 27

## 2024-06-06 DIAGNOSIS — I700 Atherosclerosis of aorta: Secondary | ICD-10-CM | POA: Diagnosis not present

## 2024-06-06 DIAGNOSIS — N1832 Chronic kidney disease, stage 3b: Secondary | ICD-10-CM | POA: Diagnosis not present

## 2024-06-06 DIAGNOSIS — J439 Emphysema, unspecified: Secondary | ICD-10-CM | POA: Diagnosis not present

## 2024-06-06 DIAGNOSIS — I10 Essential (primary) hypertension: Secondary | ICD-10-CM | POA: Diagnosis not present

## 2024-06-12 NOTE — Progress Notes (Signed)
 [facility] - Clinic Note  06/17/2024     CHIEF COMPLAINT Patient presents for Retina Follow Up  HISTORY OF PRESENT ILLNESS: Kimberly Waters is a 78 y.o. female who presents to the clinic today for:  HPI     Retina Follow Up   Patient presents with  Wet AMD.  In right eye.  This started 3.5 years ago.  Severity is moderate.  Duration of 5 weeks.  Since onset it is stable.  I, the attending physician,  performed the HPI with the patient and updated documentation appropriately.        Comments   Pt states no changes in vision. Pt has noticed a silver  streak that pops up occasionally in her left eye. Pt denies any new floaters or pain. Pt does not use any ats.      Last edited by Valdemar Rogue, MD on 06/22/2024 12:29 AM.     Pt states  Referring physician: Dwight Trula SQUIBB, MD [address] [place],  [place] [address]  HISTORICAL INFORMATION:   Selected notes from the MEDICAL RECORD NUMBER Referred by Dr. Ladora for eval of decreased VA OD.   CURRENT MEDICATIONS: No current outpatient medications on file. (Ophthalmic Drugs)   No current facility-administered medications for this visit. (Ophthalmic Drugs)   Current Outpatient Medications (Other)  Medication Sig   acetaminophen  (TYLENOL ) 325 MG tablet Take 2 tablets (650 mg total) by mouth every 4 (four) hours as needed for headache or mild pain.   albuterol  (PROVENTIL ,VENTOLIN ) 90 MCG/ACT inhaler Inhale 2 puffs into the lungs every 4 (four) hours as needed. (Patient taking differently: Inhale 2 puffs into the lungs every 4 (four) hours as needed for wheezing or shortness of breath.)   amLODipine  (NORVASC ) 5 MG tablet Take 1 tablet (5 mg total) by mouth daily.   arformoterol  (BROVANA ) 15 MCG/2ML NEBU Take 2 mLs (15 mcg total) by nebulization 2 (two) times daily.   betamethasone  dipropionate 0.05 % cream 1 application   buPROPion  (WELLBUTRIN  SR) 150 MG 12 hr tablet Take 150 mg by mouth daily.     clobetasol  ointment (TEMOVATE ) 0.05 % as needed.   escitalopram  (LEXAPRO ) 10 MG tablet Take 1 tablet (10 mg total) by mouth daily.   ezetimibe  (ZETIA ) 10 MG tablet Take 1 tablet (10 mg total) by mouth daily.   furosemide  (LASIX ) 40 MG tablet Take 1 tablet (40 mg total) by mouth daily.   ipratropium (ATROVENT ) 0.06 % nasal spray Place 2 sprays into the nose 4 (four) times daily.   ketoconazole  (NIZORAL ) 2 % cream Apply 1 application topically daily.   levocetirizine (XYZAL ) 5 MG tablet Take 5 mg by mouth as needed for allergies.   levothyroxine  (SYNTHROID ) 112 MCG tablet Take 112 mcg by mouth daily before breakfast.   melatonin 5 MG TABS Take 1 tablet (5 mg total) by mouth at bedtime.   mometasone  (ELOCON ) 0.1 % ointment Apply topically as needed.   nitroGLYCERIN  (NITROSTAT ) 0.4 MG SL tablet Place 0.4 mg under the tongue every 5 (five) minutes as needed for chest pain.   omeprazole  (PRILOSEC) 20 MG capsule Take 20 mg by mouth daily.   potassium chloride  SA (KLOR-CON  M) 20 MEQ tablet TAKE 2 TABLETS BY MOUTH DAILY   revefenacin  (YUPELRI ) 175 MCG/3ML nebulizer solution Take 3 mLs (175 mcg total) by nebulization daily.   rosuvastatin  (CRESTOR ) 20 MG tablet Take 1 tablet (20 mg total) by mouth daily.   spironolactone  (ALDACTONE ) 25 MG  tablet TAKE 1 TABLET BY MOUTH EVERY DAY   tacrolimus (PROTOPIC) 0.1 % ointment Apply topically as needed.   No current facility-administered medications for this visit. (Other)   REVIEW OF SYSTEMS: ROS   Positive for: Neurological, Eyes, Respiratory Negative for: Constitutional, Gastrointestinal, Skin, Genitourinary, Musculoskeletal, HENT, Endocrine, Cardiovascular, Psychiatric, Allergic/Imm, Heme/Lymph Last edited by Elnor Avelina RAMAN, COT on 06/17/2024  1:22 PM.      ALLERGIES Allergies  Allergen Reactions   Latex Other (See Comments)    Other reaction(s): Other (See Comments) Tears skin.   Adhesive [Tape] Other (See Comments)    (skin tears)  Tolerates  PAPER TAPE   Codeine  Nausea Only   Other Nausea Only    Anesthesia--nausea    PAST MEDICAL HISTORY Past Medical History:  Diagnosis Date   Allergy    Breast cancer (HCC)    Colon polyps    Depression    Dyspnea    SEASONAL   Family history of breast cancer    Family history of colon cancer    GERD (gastroesophageal reflux disease)    History of radiation therapy 06/05/17-07/20/17   right chest wall 50.4 Gy in 28 fractions, right axillary nodal region 45 Gy in 25 fractions   Hyperlipidemia    Hypertension    Hypertensive retinopathy    Hypothyroidism    Macular degeneration    Peripheral neuropathy 06/18/2019   PONV (postoperative nausea and vomiting)    Past Surgical History:  Procedure Laterality Date   ABDOMINAL HYSTERECTOMY     CATARACT EXTRACTION, BILATERAL Bilateral    Dr. [facility]   KNEE SURGERY Right    LEFT HEART CATH AND CORONARY ANGIOGRAPHY N/A 02/02/2023   Procedure: LEFT HEART CATH AND CORONARY ANGIOGRAPHY;  Surgeon: Burnard Debby LABOR, MD;  Location: [facility] INVASIVE CV LAB;  Service: Cardiovascular;  Laterality: N/A;   MASTECTOMY W/ SENTINEL NODE BIOPSY Right 03/05/2017   Procedure: BILATERAL TOTAL MASTECTOMIES WITH RIGHT SENTINEL LYMPH NODE BIOPSIES;  Surgeon: Morene Olives, MD;  Location: [facility] OR;  Service: General;  Laterality: Right;   SHOULDER SURGERY     BILAT   FAMILY HISTORY Family History  Problem Relation Age of Onset   Heart disease Mother 36       stents   Colon cancer Maternal Grandfather 59   Colon cancer Maternal Aunt 65   Colon cancer Maternal Uncle        dx in his 22s   Breast cancer Paternal Aunt 60   Breast cancer Cousin 14       paternal first cousin   Lung cancer Paternal Grandfather    Colon cancer Maternal Uncle    Head & neck cancer Maternal Uncle        oral cancer   SOCIAL HISTORY Social History   Tobacco Use   Smoking status: Former    Current packs/day: 0.00    Average packs/day: 1 pack/day for 45.0  years (45.0 ttl pk-yrs)    Types: Cigarettes    Start date: 02/23/1972    Quit date: 02/22/2017    Years since quitting: 7.3   Smokeless tobacco: Never  Vaping Use   Vaping status: Never Used  Substance Use Topics   Alcohol use: No    Alcohol/week: 0.0 standard drinks of alcohol   Drug use: No       OPHTHALMIC EXAM: Base Eye Exam     Visual Acuity (Snellen - Linear)       Right Left   Dist cc 20/40 -  1 CF @3 '   Dist ph cc NI NI    Correction: Glasses         Tonometry (Tonopen, 1:19 PM)       Right Left   Pressure 15 18         Pupils       Pupils Dark Light Shape React APD   Right PERRL 3 2 Round Brisk None   Left PERRL 3 2 Round Brisk None         Visual Fields       Left Right    Full Full         Extraocular Movement       Right Left    Full, Ortho Full, Ortho         Neuro/Psych     Oriented x3: Yes   Mood/Affect: Normal         Dilation     Both eyes: 1.0% Mydriacyl, 2.5% Phenylephrine  @ 1:19 PM           Slit Lamp and Fundus Exam     Slit Lamp Exam       Right Left   Lids/Lashes Dermatochalasis - upper lid Dermatochalasis - upper lid, mild MGD   Conjunctiva/Sclera nasal and temporal pinguecula nasal and temporal pinguecula   Cornea arcus, well healed cataract wound, 2-3+ Punctate epithelial erosions Trace Punctate epithelial erosions, arcus, well healed cataract wound   Anterior Chamber deep, narrow temporal angle, 0.5+ fine cell and pigment deep, narrow temporal angle   Iris Round and moderately dilated, mild anterior bowing Round and moderately dilated, mild anterior bowing   Lens PC IOL in good position PC IOL in good position   Anterior Vitreous mild syneresis, Posterior vitreous detachment mild syneresis, Posterior vitreous detachment         Fundus Exam       Right Left   Disc Pink and Sharp, Compact, mild PPA Mild Pallor, Sharp rim, +elevation   C/D Ratio 0.0 0.1   Macula Blunted foveal reflex, +CNV with  pigment clumping/ring, central IRH - stably improved, central SRF and edema -- improved, RPE mottling and clumping, Drusen, punctate heme on pigment ring -- stably resolved, no heme, +subretinal fibrosis, trace cystic changes -- slightly improved Blunted foveal reflex, large area of GA with disciform scar and subretinal fibrosis, central pigment clumping, no heme   Vessels attenuated, Tortuous attenuated, mild tortuosity, mild AV crossing changes   Periphery Attached, scattered reticular degeneration, paving stone degeneration, no heme Attached, scattered reticular degeneration, scattered paving stone degeneration, No heme           Refraction     Wearing Rx       Sphere Cylinder Axis   Right -6.50 +2.00 152   Left -5.25 +1.00 018    Type: SVL           IMAGING AND PROCEDURES  Imaging and Procedures for 06/17/2024  OCT, Retina - OU - Both Eyes       Right Eye Quality was good. Central Foveal Thickness: 277. Progression has improved. Findings include no IRF, no SRF, abnormal foveal contour, subretinal hyper-reflective material, choroidal neovascular membrane, intraretinal fluid, pigment epithelial detachment, subretinal fluid (persistent IRF / cystic changes overlying central SRHM/CNV--slightly improved).   Left Eye Quality was good. Central Foveal Thickness: 269. Progression has been stable. Findings include no IRF, no SRF, abnormal foveal contour, subretinal hyper-reflective material, lamellar hole, outer retinal atrophy (large central disciform scar with ORA, Foveal notch, trace  cystic changes and temporal edema -- all stable).   Notes *Images captured and stored on drive  Diagnosis / Impression:  exu ARMD OU OD: persistent IRF / cystic changes overlying central SRHM/CNV--slightly improved OS: large central disciform scar with ORA, Foveal notch, trace cystic changes and temporal edema -- all stable  Clinical management:  See below  Abbreviations: NFP - Normal foveal  profile. CME - cystoid macular edema. PED - pigment epithelial detachment. IRF - intraretinal fluid. SRF - subretinal fluid. EZ - ellipsoid zone. ERM - epiretinal membrane. ORA - outer retinal atrophy. ORT - outer retinal tubulation. SRHM - subretinal hyper-reflective material. IRHM - intraretinal hyper-reflective material      Intravitreal Injection, Pharmacologic Agent - OD - Right Eye       Time Out 06/17/2024. 2:23 PM. Confirmed correct patient, procedure, site, and patient consented.   Anesthesia Topical anesthesia was used. Anesthetic medications included Lidocaine  2%, Proparacaine 0.5%.   Procedure Preparation included 5% betadine to ocular surface, eyelid speculum. A supplied (32g) needle was used.   Injection: 6 mg faricimab -svoa 6 MG/0.05ML   Route: Intravitreal, Site: Right Eye   NDC: 49757-903-93, Lot: [id number], Expiration date: 05/23/2025, Waste: 0 mL   Post-op Post injection exam found visual acuity of at least counting fingers. The patient tolerated the procedure well. There were no complications. The patient received written and verbal post procedure care education. Post injection medications were not given.             ASSESSMENT/PLAN:   ICD-10-CM   1. Exudative age-related macular degeneration of right eye with active choroidal neovascularization (HCC)  H35.3211 OCT, Retina - OU - Both Eyes    Intravitreal Injection, Pharmacologic Agent - OD - Right Eye    faricimab -svoa (VABYSMO ) 6mg /0.21mL intravitreal injection    2. Exudative age-related macular degeneration of left eye with active choroidal neovascularization (HCC)  H35.3221     3. Essential hypertension  I10     4. Hypertensive retinopathy of both eyes  H35.033     5. Pseudophakia, both eyes  Z96.1     6. Combined forms of age-related cataract of both eyes  H25.813      1. Exudative age related macular degeneration OD  - delayed f/u (11.20.24 to 01.06.25): 6.5 wks instead of 4 -- death in  family - lost to follow up from 8 weeks to 7 months (12.19.23 to 07.02.24) due to cardiac issues -- vision decreased to 20/100 OD on 07.02.24  - pt initially presented w/ 2 wk history of decreased vision OD (12.21.2021) - history of intravitreal injections OS with Dr. Elner in 2017 and earlier -- pt reports stopping visits after the development of a corneal abrasion from a lid speculum. - s/p IVA OD #1 (12.21.21), #2 (01.24.22), #3 (2.21.22), #4 (03.22.22), #5 (4.26.22), #6 (5.31.22), #7 (7.12.22), #8 (8.16.22), #9 (9.20.22), #10 (10.25.22), #11 (12.6.22), #12 (01.10.23), #13 (02.14.23), #14 (03.21.23), #15 (04.25.23), #16 06.06.23), #17 (08.01.23), #18 (09.12.23), #19 (10.31.23), #20 (12.19.23) -- IVA resistance  - s/p IVE OD #1 (07.02.24), #2 (07.30.24),#3 (08.27.24), #4 (09.25.24), #5 (10.23.24), #6 (11.20.24), #7 (01.06.25), #8 (02.03.25), #9 (03.11.25) -- IVE resistance  - s/p IVV OD #1 (04.15.25), #2 (05.20.25)  **history of increased SRF at 6 wk interval, noted on 12.6.22**  **history of increased SRF at 8 wk interval, noted on 08.01.23** - OCT shows persistent IRF / cystic changes overlying central SRHM/CNV--slightly improved at 5 weeks  - BCVA OD 20/40 from 20/50             -  Recommend IVV OD #3 today, 06.24.25 w/ f/u in 5 wks again  - RBA of procedure discussed, questions answered  - IVE informed consent obtained and signed, 07.02.24 (OD)  - see procedure note  - f/u in 5 wks -- DFE/OCT, possible injection   2. Exudative age related macular degeneration, OS - OS with history inactive disciform scar, but new focal IRH noted 5.31.22 -- stably resolved on exam today - s/p IVA OS #1 (05.31.22), #2 (07.12.22), #3 (8.15.22), #4 (9.20.22) for focal [facility] - history of intravitreal injections OS with Dr. Elner in 2017 and earlier -- pt reports stopping visits after the development of a corneal abrasion from a lid speculum  - before being referred here, had not seen a retina specialist since  2017  - exam and OCT OS shows large central disciform scar with ORA, Foveal notch, trace cystic changes and temporal edema -- all stable   - BCVA CF OS -- stable - recommend holding IVA OS today - f/u 5 weeks, DFE, OCT  3,4. Hypertensive retinopathy OU - discussed importance of tight BP control - monitor  5. Pseudophakia OU  - s/p CE/IOL (Dr. Fleeta)  - IOL in good position, doing well  - monitor  Ophthalmic Meds Ordered this visit:  Meds ordered this encounter  Medications   faricimab -svoa (VABYSMO ) 6mg /0.12mL intravitreal injection     Return in about 5 weeks (around 07/22/2024) for exu ARMD OD, DFE, OCT, Possible Injxn.  There are no Patient Instructions on file for this visit.  This document serves as a record of services personally performed by Redell JUDITHANN Hans, MD, PhD. It was created on their behalf by Alan PARAS. Delores, OA an ophthalmic technician. The creation of this record is the provider's dictation and/or activities during the visit.    Electronically signed by: Alan PARAS. Delores, OA 06/22/24 12:29 AM   Redell JUDITHANN Hans, M.D., Ph.D. Diseases & Surgery of the Retina and Vitreous [facility]  I have reviewed the above documentation for accuracy and completeness, and I agree with the above. Redell JUDITHANN Hans, M.D., Ph.D. 06/22/24 12:31 AM   Abbreviations: M myopia (nearsighted); A astigmatism; H hyperopia (farsighted); P presbyopia; Mrx spectacle prescription;  CTL contact lenses; OD right eye; OS left eye; OU both eyes  XT exotropia; ET esotropia; PEK punctate epithelial keratitis; PEE punctate epithelial erosions; DES dry eye syndrome; MGD meibomian gland dysfunction; ATs artificial tears; PFAT's preservative free artificial tears; NSC nuclear sclerotic cataract; PSC posterior subcapsular cataract; ERM epi-retinal membrane; PVD posterior vitreous detachment; RD retinal detachment; DM diabetes mellitus; DR diabetic retinopathy; NPDR non-proliferative  diabetic retinopathy; PDR proliferative diabetic retinopathy; CSME clinically significant macular edema; DME diabetic macular edema; dbh dot blot hemorrhages; CWS cotton wool spot; POAG primary open angle glaucoma; C/D cup-to-disc ratio; HVF humphrey visual field; GVF goldmann visual field; OCT optical coherence tomography; IOP intraocular pressure; BRVO Branch retinal vein occlusion; CRVO central retinal vein occlusion; CRAO central retinal artery occlusion; BRAO branch retinal artery occlusion; RT retinal tear; SB scleral buckle; PPV pars plana vitrectomy; VH Vitreous hemorrhage; PRP panretinal laser photocoagulation; IVK intravitreal kenalog; VMT vitreomacular traction; MH Macular hole;  NVD neovascularization of the disc; NVE neovascularization elsewhere; AREDS age related eye disease study; ARMD age related macular degeneration; POAG primary open angle glaucoma; EBMD epithelial/anterior basement membrane dystrophy; ACIOL anterior chamber intraocular lens; IOL intraocular lens; PCIOL posterior chamber intraocular lens; Phaco/IOL phacoemulsification with intraocular lens placement; PRK photorefractive keratectomy; LASIK laser assisted in situ keratomileusis;  HTN hypertension; DM diabetes mellitus; COPD chronic obstructive pulmonary disease

## 2024-06-17 ENCOUNTER — Encounter (INDEPENDENT_AMBULATORY_CARE_PROVIDER_SITE_OTHER): Payer: Self-pay | Admitting: Ophthalmology

## 2024-06-17 ENCOUNTER — Ambulatory Visit (INDEPENDENT_AMBULATORY_CARE_PROVIDER_SITE_OTHER): Admitting: Ophthalmology

## 2024-06-17 DIAGNOSIS — H35033 Hypertensive retinopathy, bilateral: Secondary | ICD-10-CM | POA: Diagnosis not present

## 2024-06-17 DIAGNOSIS — M47812 Spondylosis without myelopathy or radiculopathy, cervical region: Secondary | ICD-10-CM | POA: Diagnosis not present

## 2024-06-17 DIAGNOSIS — N1832 Chronic kidney disease, stage 3b: Secondary | ICD-10-CM | POA: Diagnosis not present

## 2024-06-17 DIAGNOSIS — H353211 Exudative age-related macular degeneration, right eye, with active choroidal neovascularization: Secondary | ICD-10-CM

## 2024-06-17 DIAGNOSIS — I10 Essential (primary) hypertension: Secondary | ICD-10-CM

## 2024-06-17 DIAGNOSIS — H353231 Exudative age-related macular degeneration, bilateral, with active choroidal neovascularization: Secondary | ICD-10-CM | POA: Diagnosis not present

## 2024-06-17 DIAGNOSIS — H25813 Combined forms of age-related cataract, bilateral: Secondary | ICD-10-CM

## 2024-06-17 DIAGNOSIS — Z961 Presence of intraocular lens: Secondary | ICD-10-CM

## 2024-06-17 DIAGNOSIS — M542 Cervicalgia: Secondary | ICD-10-CM | POA: Diagnosis not present

## 2024-06-17 DIAGNOSIS — H353221 Exudative age-related macular degeneration, left eye, with active choroidal neovascularization: Secondary | ICD-10-CM

## 2024-06-17 MED ORDER — FARICIMAB-SVOA 6 MG/0.05ML IZ SOSY
6.0000 mg | PREFILLED_SYRINGE | INTRAVITREAL | Status: AC | PRN
  Administered 2024-06-17: 6 mg via INTRAVITREAL

## 2024-06-23 DIAGNOSIS — I700 Atherosclerosis of aorta: Secondary | ICD-10-CM | POA: Diagnosis not present

## 2024-06-23 DIAGNOSIS — I5022 Chronic systolic (congestive) heart failure: Secondary | ICD-10-CM | POA: Diagnosis not present

## 2024-06-23 DIAGNOSIS — Z853 Personal history of malignant neoplasm of breast: Secondary | ICD-10-CM | POA: Diagnosis not present

## 2024-06-23 DIAGNOSIS — N1832 Chronic kidney disease, stage 3b: Secondary | ICD-10-CM | POA: Diagnosis not present

## 2024-06-23 DIAGNOSIS — I10 Essential (primary) hypertension: Secondary | ICD-10-CM | POA: Diagnosis not present

## 2024-06-23 DIAGNOSIS — J439 Emphysema, unspecified: Secondary | ICD-10-CM | POA: Diagnosis not present

## 2024-06-25 DIAGNOSIS — H31011 Macula scars of posterior pole (postinflammatory) (post-traumatic), right eye: Secondary | ICD-10-CM | POA: Diagnosis not present

## 2024-06-25 DIAGNOSIS — H353211 Exudative age-related macular degeneration, right eye, with active choroidal neovascularization: Secondary | ICD-10-CM | POA: Diagnosis not present

## 2024-06-25 DIAGNOSIS — H35033 Hypertensive retinopathy, bilateral: Secondary | ICD-10-CM | POA: Diagnosis not present

## 2024-06-25 DIAGNOSIS — H353124 Nonexudative age-related macular degeneration, left eye, advanced atrophic with subfoveal involvement: Secondary | ICD-10-CM | POA: Diagnosis not present

## 2024-07-04 DIAGNOSIS — N184 Chronic kidney disease, stage 4 (severe): Secondary | ICD-10-CM | POA: Diagnosis not present

## 2024-07-04 DIAGNOSIS — N281 Cyst of kidney, acquired: Secondary | ICD-10-CM | POA: Diagnosis not present

## 2024-07-06 DIAGNOSIS — I10 Essential (primary) hypertension: Secondary | ICD-10-CM | POA: Diagnosis not present

## 2024-07-06 DIAGNOSIS — J439 Emphysema, unspecified: Secondary | ICD-10-CM | POA: Diagnosis not present

## 2024-07-06 DIAGNOSIS — N1832 Chronic kidney disease, stage 3b: Secondary | ICD-10-CM | POA: Diagnosis not present

## 2024-07-06 DIAGNOSIS — I700 Atherosclerosis of aorta: Secondary | ICD-10-CM | POA: Diagnosis not present

## 2024-07-10 DIAGNOSIS — E039 Hypothyroidism, unspecified: Secondary | ICD-10-CM | POA: Diagnosis not present

## 2024-07-11 DIAGNOSIS — E785 Hyperlipidemia, unspecified: Secondary | ICD-10-CM | POA: Diagnosis not present

## 2024-07-11 DIAGNOSIS — I5022 Chronic systolic (congestive) heart failure: Secondary | ICD-10-CM | POA: Diagnosis not present

## 2024-07-11 DIAGNOSIS — I129 Hypertensive chronic kidney disease with stage 1 through stage 4 chronic kidney disease, or unspecified chronic kidney disease: Secondary | ICD-10-CM | POA: Diagnosis not present

## 2024-07-11 DIAGNOSIS — N1832 Chronic kidney disease, stage 3b: Secondary | ICD-10-CM | POA: Diagnosis not present

## 2024-07-21 NOTE — Progress Notes (Signed)
 [facility] - Clinic Note  07/22/2024     CHIEF COMPLAINT Patient presents for Retina Follow Up  HISTORY OF PRESENT ILLNESS: Kimberly Waters is a 78 y.o. female who presents to the clinic today for:  HPI     Retina Follow Up   Patient presents with  Wet AMD.  In right eye.  This started 5 weeks ago.  I, the attending physician,  performed the HPI with the patient and updated documentation appropriately.        Comments   Patient here for 5 weeks retina follow up for exu ARMD OD. Patient states vision about the same. No eye pain.       Last edited by Valdemar Rogue, MD on 07/22/2024 11:57 PM.      Pt states vision is stable  Referring physician: Dwight Trula SQUIBB, MD [address] [place],  [place] [address]  HISTORICAL INFORMATION:   Selected notes from the MEDICAL RECORD NUMBER Referred by Dr. Ladora for eval of decreased VA OD.   CURRENT MEDICATIONS: No current outpatient medications on file. (Ophthalmic Drugs)   No current facility-administered medications for this visit. (Ophthalmic Drugs)   Current Outpatient Medications (Other)  Medication Sig   acetaminophen  (TYLENOL ) 325 MG tablet Take 2 tablets (650 mg total) by mouth every 4 (four) hours as needed for headache or mild pain.   albuterol  (PROVENTIL ,VENTOLIN ) 90 MCG/ACT inhaler Inhale 2 puffs into the lungs every 4 (four) hours as needed. (Patient taking differently: Inhale 2 puffs into the lungs every 4 (four) hours as needed for wheezing or shortness of breath.)   amLODipine  (NORVASC ) 5 MG tablet Take 1 tablet (5 mg total) by mouth daily.   arformoterol  (BROVANA ) 15 MCG/2ML NEBU Take 2 mLs (15 mcg total) by nebulization 2 (two) times daily.   betamethasone  dipropionate 0.05 % cream 1 application   buPROPion  (WELLBUTRIN  SR) 150 MG 12 hr tablet Take 150 mg by mouth daily.    clobetasol  ointment (TEMOVATE ) 0.05 % as needed.   escitalopram  (LEXAPRO ) 10 MG tablet Take 1 tablet (10 mg total)  by mouth daily.   ezetimibe  (ZETIA ) 10 MG tablet Take 1 tablet (10 mg total) by mouth daily.   furosemide  (LASIX ) 40 MG tablet Take 1 tablet (40 mg total) by mouth daily.   ipratropium (ATROVENT ) 0.06 % nasal spray Place 2 sprays into the nose 4 (four) times daily.   ketoconazole  (NIZORAL ) 2 % cream Apply 1 application topically daily.   levocetirizine (XYZAL ) 5 MG tablet Take 5 mg by mouth as needed for allergies.   levothyroxine  (SYNTHROID ) 112 MCG tablet Take 112 mcg by mouth daily before breakfast.   melatonin 5 MG TABS Take 1 tablet (5 mg total) by mouth at bedtime.   mometasone  (ELOCON ) 0.1 % ointment Apply topically as needed.   nitroGLYCERIN  (NITROSTAT ) 0.4 MG SL tablet Place 0.4 mg under the tongue every 5 (five) minutes as needed for chest pain.   omeprazole  (PRILOSEC) 20 MG capsule Take 20 mg by mouth daily.   potassium chloride  SA (KLOR-CON  M) 20 MEQ tablet TAKE 2 TABLETS BY MOUTH DAILY   revefenacin  (YUPELRI ) 175 MCG/3ML nebulizer solution Take 3 mLs (175 mcg total) by nebulization daily.   rosuvastatin  (CRESTOR ) 20 MG tablet Take 1 tablet (20 mg total) by mouth daily.   spironolactone  (ALDACTONE ) 25 MG tablet TAKE 1 TABLET BY MOUTH EVERY DAY   tacrolimus (PROTOPIC) 0.1 % ointment Apply topically as needed.   No  current facility-administered medications for this visit. (Other)   REVIEW OF SYSTEMS: ROS   Positive for: Neurological, Eyes, Respiratory Negative for: Constitutional, Gastrointestinal, Skin, Genitourinary, Musculoskeletal, HENT, Endocrine, Cardiovascular, Psychiatric, Allergic/Imm, Heme/Lymph Last edited by Orval Asberry RAMAN, COA on 07/22/2024 12:34 PM.       ALLERGIES Allergies  Allergen Reactions   Latex Other (See Comments)    Other reaction(s): Other (See Comments) Tears skin.   Adhesive [Tape] Other (See Comments)    (skin tears)  Tolerates PAPER TAPE   Codeine  Nausea Only   Other Nausea Only    Anesthesia--nausea    PAST MEDICAL HISTORY Past  Medical History:  Diagnosis Date   Allergy    Breast cancer (HCC)    Colon polyps    Depression    Dyspnea    SEASONAL   Family history of breast cancer    Family history of colon cancer    GERD (gastroesophageal reflux disease)    History of radiation therapy 06/05/17-07/20/17   right chest wall 50.4 Gy in 28 fractions, right axillary nodal region 45 Gy in 25 fractions   Hyperlipidemia    Hypertension    Hypertensive retinopathy    Hypothyroidism    Macular degeneration    Peripheral neuropathy 06/18/2019   PONV (postoperative nausea and vomiting)    Past Surgical History:  Procedure Laterality Date   ABDOMINAL HYSTERECTOMY     CATARACT EXTRACTION, BILATERAL Bilateral    Dr. [facility]   KNEE SURGERY Right    LEFT HEART CATH AND CORONARY ANGIOGRAPHY N/A 02/02/2023   Procedure: LEFT HEART CATH AND CORONARY ANGIOGRAPHY;  Surgeon: Burnard Debby LABOR, MD;  Location: [facility] INVASIVE CV LAB;  Service: Cardiovascular;  Laterality: N/A;   MASTECTOMY W/ SENTINEL NODE BIOPSY Right 03/05/2017   Procedure: BILATERAL TOTAL MASTECTOMIES WITH RIGHT SENTINEL LYMPH NODE BIOPSIES;  Surgeon: Morene Olives, MD;  Location: [facility] OR;  Service: General;  Laterality: Right;   SHOULDER SURGERY     BILAT   FAMILY HISTORY Family History  Problem Relation Age of Onset   Heart disease Mother 29       stents   Colon cancer Maternal Grandfather 59   Colon cancer Maternal Aunt 65   Colon cancer Maternal Uncle        dx in his 37s   Breast cancer Paternal Aunt 50   Breast cancer Cousin 57       paternal first cousin   Lung cancer Paternal Grandfather    Colon cancer Maternal Uncle    Head & neck cancer Maternal Uncle        oral cancer   SOCIAL HISTORY Social History   Tobacco Use   Smoking status: Former    Current packs/day: 0.00    Average packs/day: 1 pack/day for 45.0 years (45.0 ttl pk-yrs)    Types: Cigarettes    Start date: 02/23/1972    Quit date: 02/22/2017    Years since  quitting: 7.4   Smokeless tobacco: Never  Vaping Use   Vaping status: Never Used  Substance Use Topics   Alcohol use: No    Alcohol/week: 0.0 standard drinks of alcohol   Drug use: No       OPHTHALMIC EXAM: Base Eye Exam     Visual Acuity (Snellen - Linear)       Right Left   Dist cc 20/40 -2 CF at 3'   Dist ph cc NI NI    Correction: Glasses  Tonometry (Tonopen, 12:32 PM)       Right Left   Pressure 16 14         Pupils       Dark Light Shape React APD   Right 3 2 Round Brisk None   Left 3 2 Round Brisk None         Visual Fields (Counting fingers)       Left Right    Full Full         Extraocular Movement       Right Left    Full, Ortho Full, Ortho         Neuro/Psych     Oriented x3: Yes   Mood/Affect: Normal         Dilation     Both eyes: 1.0% Mydriacyl, 2.5% Phenylephrine  @ 12:32 PM           Slit Lamp and Fundus Exam     Slit Lamp Exam       Right Left   Lids/Lashes Dermatochalasis - upper lid Dermatochalasis - upper lid, mild MGD   Conjunctiva/Sclera nasal and temporal pinguecula nasal and temporal pinguecula   Cornea arcus, well healed cataract wound, 2-3+ Punctate epithelial erosions Trace Punctate epithelial erosions, arcus, well healed cataract wound   Anterior Chamber deep, narrow temporal angle, 0.5+ fine cell and pigment deep, narrow temporal angle   Iris Round and moderately dilated, mild anterior bowing Round and moderately dilated, mild anterior bowing   Lens PC IOL in good position PC IOL in good position   Anterior Vitreous mild syneresis, Posterior vitreous detachment mild syneresis, Posterior vitreous detachment         Fundus Exam       Right Left   Disc Pink and Sharp, Compact, mild PPA Mild Pallor, Sharp rim, +elevation   C/D Ratio 0.0 0.1   Macula Blunted foveal reflex, +CNV with pigment clumping/ring, central IRH - stably improved, central SRF and edema -- improved, RPE mottling and  clumping, Drusen, punctate heme on pigment ring -- stably resolved, no heme, +subretinal fibrosis, trace cystic changes Blunted foveal reflex, large area of GA with disciform scar and subretinal fibrosis, central pigment clumping, no heme   Vessels attenuated, Tortuous attenuated, mild tortuosity, mild AV crossing changes   Periphery Attached, scattered reticular degeneration, paving stone degeneration, no heme Attached, scattered reticular degeneration, scattered paving stone degeneration, No heme           Refraction     Wearing Rx       Sphere Cylinder Axis   Right -6.50 +2.00 152   Left -5.25 +1.00 018    Type: SVL           IMAGING AND PROCEDURES  Imaging and Procedures for 07/22/2024  OCT, Retina - OU - Both Eyes       Right Eye Quality was good. Central Foveal Thickness: 274. Progression has been stable. Findings include no IRF, no SRF, abnormal foveal contour, subretinal hyper-reflective material, choroidal neovascular membrane, intraretinal fluid, pigment epithelial detachment, subretinal fluid (persistent IRF / cystic changes overlying central SRHM/CNV ).   Left Eye Quality was good. Central Foveal Thickness: 239. Progression has been stable. Findings include no IRF, no SRF, abnormal foveal contour, subretinal hyper-reflective material, lamellar hole, outer retinal atrophy (large central disciform scar with ORA, foveal notch, trace cystic changes and temporal edema -- all stable).   Notes *Images captured and stored on drive  Diagnosis / Impression:  exu ARMD OU OD:  persistent IRF / cystic changes overlying central SRHM/CNV OS: large central disciform scar with ORA, Foveal notch, trace cystic changes and temporal edema -- all stable  Clinical management:  See below  Abbreviations: NFP - Normal foveal profile. CME - cystoid macular edema. PED - pigment epithelial detachment. IRF - intraretinal fluid. SRF - subretinal fluid. EZ - ellipsoid zone. ERM - epiretinal  membrane. ORA - outer retinal atrophy. ORT - outer retinal tubulation. SRHM - subretinal hyper-reflective material. IRHM - intraretinal hyper-reflective material      Intravitreal Injection, Pharmacologic Agent - OD - Right Eye       Time Out 07/22/2024. 12:59 PM. Confirmed correct patient, procedure, site, and patient consented.   Anesthesia Topical anesthesia was used. Anesthetic medications included Lidocaine  2%, Proparacaine 0.5%.   Procedure Preparation included 5% betadine to ocular surface, eyelid speculum. A supplied (32g) needle was used.   Injection: 6 mg faricimab -svoa 6 MG/0.05ML   Route: Intravitreal, Site: Right Eye   NDC: 49757-903-93, Lot: [id number], Expiration date: 07/24/2025, Waste: 0 mL   Post-op Post injection exam found visual acuity of at least counting fingers. The patient tolerated the procedure well. There were no complications. The patient received written and verbal post procedure care education. Post injection medications were not given.              ASSESSMENT/PLAN:   ICD-10-CM   1. Exudative age-related macular degeneration of right eye with active choroidal neovascularization (HCC)  H35.3211 OCT, Retina - OU - Both Eyes    Intravitreal Injection, Pharmacologic Agent - OD - Right Eye    faricimab -svoa (VABYSMO ) 6mg /0.55mL intravitreal injection    2. Exudative age-related macular degeneration of left eye with active choroidal neovascularization (HCC)  H35.3221     3. Essential hypertension  I10     4. Hypertensive retinopathy of both eyes  H35.033     5. Pseudophakia, both eyes  Z96.1       1. Exudative age related macular degeneration OD  - delayed f/u (11.20.24 to 01.06.25): 6.5 wks instead of 4 -- death in family - lost to follow up from 8 weeks to 7 months (12.19.23 to 07.02.24) due to cardiac issues -- vision decreased to 20/100 OD on 07.02.24  - pt initially presented w/ 2 wk history of decreased vision OD (12.21.2021) - history of  intravitreal injections OS with Dr. Elner in 2017 and earlier -- pt reports stopping visits after the development of a corneal abrasion from a lid speculum. - s/p IVA OD #1 (12.21.21), #2 (01.24.22), #3 (2.21.22), #4 (03.22.22), #5 (4.26.22), #6 (5.31.22), #7 (7.12.22), #8 (8.16.22), #9 (9.20.22), #10 (10.25.22), #11 (12.6.22), #12 (01.10.23), #13 (02.14.23), #14 (03.21.23), #15 (04.25.23), #16 06.06.23), #17 (08.01.23), #18 (09.12.23), #19 (10.31.23), #20 (12.19.23) -- IVA resistance  - s/p IVE OD #1 (07.02.24), #2 (07.30.24),#3 (08.27.24), #4 (09.25.24), #5 (10.23.24), #6 (11.20.24), #7 (01.06.25), #8 (02.03.25), #9 (03.11.25) -- IVE resistance  - s/p IVV OD #1 (04.15.25), #2 (05.20.25), #3 (06.24.25)  **history of increased SRF at 6 wk interval, noted on 12.6.22**  **history of increased SRF at 8 wk interval, noted on 08.01.23** - OCT shows persistent IRF / cystic changes overlying central SRHM/CNV at 5 weeks  - BCVA OD 20/40 - stable             - Recommend IVV OD #4 today, 07.29.25 w/ f/u in 5 wks again  - RBA of procedure discussed, questions answered  - IVE informed consent obtained and signed, 07.02.24 (OD)  -  see procedure note  - f/u in 5 wks -- DFE/OCT, possible injection   2. Exudative age related macular degeneration, OS - OS with history inactive disciform scar, but new focal IRH noted 5.31.22 -- stably resolved on exam today - s/p IVA OS #1 (05.31.22), #2 (07.12.22), #3 (8.15.22), #4 (9.20.22) for focal [facility] - history of intravitreal injections OS with Dr. Elner in 2017 and earlier -- pt reports stopping visits after the development of a corneal abrasion from a lid speculum  - before being referred here, had not seen a retina specialist since 2017  - exam and OCT OS shows large central disciform scar with ORA, Foveal notch, trace cystic changes and temporal edema -- all stable   - BCVA CF OS -- stable - recommend holding IVA OS today - f/u 5 weeks DFE, OCT  3,4. Hypertensive  retinopathy OU - discussed importance of tight BP control - monitor  5. Pseudophakia OU  - s/p CE/IOL (Dr. Fleeta)  - IOL in good position, doing well  - monitor  Ophthalmic Meds Ordered this visit:  Meds ordered this encounter  Medications   faricimab -svoa (VABYSMO ) 6mg /0.68mL intravitreal injection     Return in about 5 weeks (around 08/26/2024) for f/u exu ARMD OU, DFE, OCT, Possible Injxn.  There are no Patient Instructions on file for this visit.  This document serves as a record of services personally performed by Redell JUDITHANN Hans, MD, PhD. It was created on their behalf by Auston Muzzy, COMT. The creation of this record is the provider's dictation and/or activities during the visit.  Electronically signed by: Auston Muzzy, COMT 07/27/24 1:48 AM  This document serves as a record of services personally performed by Redell JUDITHANN Hans, MD, PhD. It was created on their behalf by Alan PARAS. Delores, OA an ophthalmic technician. The creation of this record is the provider's dictation and/or activities during the visit.    Electronically signed by: Alan PARAS. Delores, OA 07/27/24 1:48 AM  Redell JUDITHANN Hans, M.D., Ph.D. Diseases & Surgery of the Retina and Vitreous [facility]  I have reviewed the above documentation for accuracy and completeness, and I agree with the above. Redell JUDITHANN Hans, M.D., Ph.D. 07/27/24 1:50 AM   Abbreviations: M myopia (nearsighted); A astigmatism; H hyperopia (farsighted); P presbyopia; Mrx spectacle prescription;  CTL contact lenses; OD right eye; OS left eye; OU both eyes  XT exotropia; ET esotropia; PEK punctate epithelial keratitis; PEE punctate epithelial erosions; DES dry eye syndrome; MGD meibomian gland dysfunction; ATs artificial tears; PFAT's preservative free artificial tears; NSC nuclear sclerotic cataract; PSC posterior subcapsular cataract; ERM epi-retinal membrane; PVD posterior vitreous detachment; RD retinal detachment; DM diabetes  mellitus; DR diabetic retinopathy; NPDR non-proliferative diabetic retinopathy; PDR proliferative diabetic retinopathy; CSME clinically significant macular edema; DME diabetic macular edema; dbh dot blot hemorrhages; CWS cotton wool spot; POAG primary open angle glaucoma; C/D cup-to-disc ratio; HVF humphrey visual field; GVF goldmann visual field; OCT optical coherence tomography; IOP intraocular pressure; BRVO Branch retinal vein occlusion; CRVO central retinal vein occlusion; CRAO central retinal artery occlusion; BRAO branch retinal artery occlusion; RT retinal tear; SB scleral buckle; PPV pars plana vitrectomy; VH Vitreous hemorrhage; PRP panretinal laser photocoagulation; IVK intravitreal kenalog; VMT vitreomacular traction; MH Macular hole;  NVD neovascularization of the disc; NVE neovascularization elsewhere; AREDS age related eye disease study; ARMD age related macular degeneration; POAG primary open angle glaucoma; EBMD epithelial/anterior basement membrane dystrophy; ACIOL anterior chamber intraocular lens; IOL intraocular lens; PCIOL  posterior chamber intraocular lens; Phaco/IOL phacoemulsification with intraocular lens placement; PRK photorefractive keratectomy; LASIK laser assisted in situ keratomileusis; HTN hypertension; DM diabetes mellitus; COPD chronic obstructive pulmonary disease

## 2024-07-22 ENCOUNTER — Encounter (INDEPENDENT_AMBULATORY_CARE_PROVIDER_SITE_OTHER): Payer: Self-pay | Admitting: Ophthalmology

## 2024-07-22 ENCOUNTER — Ambulatory Visit (INDEPENDENT_AMBULATORY_CARE_PROVIDER_SITE_OTHER): Admitting: Ophthalmology

## 2024-07-22 DIAGNOSIS — H353231 Exudative age-related macular degeneration, bilateral, with active choroidal neovascularization: Secondary | ICD-10-CM

## 2024-07-22 DIAGNOSIS — I10 Essential (primary) hypertension: Secondary | ICD-10-CM | POA: Diagnosis not present

## 2024-07-22 DIAGNOSIS — Z961 Presence of intraocular lens: Secondary | ICD-10-CM | POA: Diagnosis not present

## 2024-07-22 DIAGNOSIS — H353221 Exudative age-related macular degeneration, left eye, with active choroidal neovascularization: Secondary | ICD-10-CM

## 2024-07-22 DIAGNOSIS — H35033 Hypertensive retinopathy, bilateral: Secondary | ICD-10-CM | POA: Diagnosis not present

## 2024-07-22 DIAGNOSIS — H353211 Exudative age-related macular degeneration, right eye, with active choroidal neovascularization: Secondary | ICD-10-CM

## 2024-07-22 MED ORDER — FARICIMAB-SVOA 6 MG/0.05ML IZ SOSY
6.0000 mg | PREFILLED_SYRINGE | INTRAVITREAL | Status: AC | PRN
  Administered 2024-07-22: 6 mg via INTRAVITREAL

## 2024-07-24 DIAGNOSIS — I10 Essential (primary) hypertension: Secondary | ICD-10-CM | POA: Diagnosis not present

## 2024-07-24 DIAGNOSIS — N1832 Chronic kidney disease, stage 3b: Secondary | ICD-10-CM | POA: Diagnosis not present

## 2024-07-24 DIAGNOSIS — Z853 Personal history of malignant neoplasm of breast: Secondary | ICD-10-CM | POA: Diagnosis not present

## 2024-07-24 DIAGNOSIS — I5022 Chronic systolic (congestive) heart failure: Secondary | ICD-10-CM | POA: Diagnosis not present

## 2024-08-05 DIAGNOSIS — N1832 Chronic kidney disease, stage 3b: Secondary | ICD-10-CM | POA: Diagnosis not present

## 2024-08-05 DIAGNOSIS — I10 Essential (primary) hypertension: Secondary | ICD-10-CM | POA: Diagnosis not present

## 2024-08-05 DIAGNOSIS — I700 Atherosclerosis of aorta: Secondary | ICD-10-CM | POA: Diagnosis not present

## 2024-08-05 DIAGNOSIS — J439 Emphysema, unspecified: Secondary | ICD-10-CM | POA: Diagnosis not present

## 2024-08-21 NOTE — Progress Notes (Signed)
 [facility] - Clinic Note  08/26/2024     CHIEF COMPLAINT Patient presents for Retina Follow Up  HISTORY OF PRESENT ILLNESS: Kimberly Waters is a 78 y.o. female who presents to the clinic today for:  HPI     Retina Follow Up   Patient presents with  Wet AMD.  In right eye.  This started 5 weeks ago.  I, the attending physician,  performed the HPI with the patient and updated documentation appropriately.        Comments   Patient states the vision is the same. She is not using eye drops at this time.       Last edited by Valdemar Rogue, MD on 08/26/2024  4:43 PM.     Pt states vision is stable, had two grand babies since she was last year.   Referring physician: Dwight Trula SQUIBB, MD [address] [place],  [place] [address]  HISTORICAL INFORMATION:   Selected notes from the MEDICAL RECORD NUMBER Referred by Dr. Ladora for eval of decreased VA OD.   CURRENT MEDICATIONS: No current outpatient medications on file. (Ophthalmic Drugs)   No current facility-administered medications for this visit. (Ophthalmic Drugs)   Current Outpatient Medications (Other)  Medication Sig   acetaminophen  (TYLENOL ) 325 MG tablet Take 2 tablets (650 mg total) by mouth every 4 (four) hours as needed for headache or mild pain.   albuterol  (PROVENTIL ,VENTOLIN ) 90 MCG/ACT inhaler Inhale 2 puffs into the lungs every 4 (four) hours as needed. (Patient taking differently: Inhale 2 puffs into the lungs every 4 (four) hours as needed for wheezing or shortness of breath.)   amLODipine  (NORVASC ) 5 MG tablet Take 1 tablet (5 mg total) by mouth daily.   arformoterol  (BROVANA ) 15 MCG/2ML NEBU Take 2 mLs (15 mcg total) by nebulization 2 (two) times daily.   betamethasone  dipropionate 0.05 % cream 1 application   buPROPion  (WELLBUTRIN  SR) 150 MG 12 hr tablet Take 150 mg by mouth daily.    clobetasol  ointment (TEMOVATE ) 0.05 % as needed.   escitalopram  (LEXAPRO ) 10 MG tablet Take 1 tablet  (10 mg total) by mouth daily.   ezetimibe  (ZETIA ) 10 MG tablet Take 1 tablet (10 mg total) by mouth daily.   furosemide  (LASIX ) 40 MG tablet Take 1 tablet (40 mg total) by mouth daily.   ipratropium (ATROVENT ) 0.06 % nasal spray Place 2 sprays into the nose 4 (four) times daily.   ketoconazole  (NIZORAL ) 2 % cream Apply 1 application topically daily.   levocetirizine (XYZAL ) 5 MG tablet Take 5 mg by mouth as needed for allergies.   levothyroxine  (SYNTHROID ) 112 MCG tablet Take 112 mcg by mouth daily before breakfast.   melatonin 5 MG TABS Take 1 tablet (5 mg total) by mouth at bedtime.   mometasone  (ELOCON ) 0.1 % ointment Apply topically as needed.   nitroGLYCERIN  (NITROSTAT ) 0.4 MG SL tablet Place 0.4 mg under the tongue every 5 (five) minutes as needed for chest pain.   omeprazole  (PRILOSEC) 20 MG capsule Take 20 mg by mouth daily.   potassium chloride  SA (KLOR-CON  M) 20 MEQ tablet TAKE 2 TABLETS BY MOUTH DAILY   revefenacin  (YUPELRI ) 175 MCG/3ML nebulizer solution Take 3 mLs (175 mcg total) by nebulization daily.   rosuvastatin  (CRESTOR ) 20 MG tablet Take 1 tablet (20 mg total) by mouth daily.   spironolactone  (ALDACTONE ) 25 MG tablet TAKE 1 TABLET BY MOUTH EVERY DAY   tacrolimus (PROTOPIC) 0.1 % ointment Apply topically  as needed.   No current facility-administered medications for this visit. (Other)   REVIEW OF SYSTEMS:     ALLERGIES Allergies  Allergen Reactions   Latex Other (See Comments)    Other reaction(s): Other (See Comments) Tears skin.   Adhesive [Tape] Other (See Comments)    (skin tears)  Tolerates PAPER TAPE   Codeine  Nausea Only   Other Nausea Only    Anesthesia--nausea    PAST MEDICAL HISTORY Past Medical History:  Diagnosis Date   Allergy    Breast cancer (HCC)    Colon polyps    Depression    Dyspnea    SEASONAL   Family history of breast cancer    Family history of colon cancer    GERD (gastroesophageal reflux disease)    History of radiation  therapy 06/05/17-07/20/17   right chest wall 50.4 Gy in 28 fractions, right axillary nodal region 45 Gy in 25 fractions   Hyperlipidemia    Hypertension    Hypertensive retinopathy    Hypothyroidism    Macular degeneration    Peripheral neuropathy 06/18/2019   PONV (postoperative nausea and vomiting)    Past Surgical History:  Procedure Laterality Date   ABDOMINAL HYSTERECTOMY     CATARACT EXTRACTION, BILATERAL Bilateral    Dr. [facility]   KNEE SURGERY Right    LEFT HEART CATH AND CORONARY ANGIOGRAPHY N/A 02/02/2023   Procedure: LEFT HEART CATH AND CORONARY ANGIOGRAPHY;  Surgeon: Burnard Debby LABOR, MD;  Location: [facility] INVASIVE CV LAB;  Service: Cardiovascular;  Laterality: N/A;   MASTECTOMY W/ SENTINEL NODE BIOPSY Right 03/05/2017   Procedure: BILATERAL TOTAL MASTECTOMIES WITH RIGHT SENTINEL LYMPH NODE BIOPSIES;  Surgeon: Morene Olives, MD;  Location: [facility] OR;  Service: General;  Laterality: Right;   SHOULDER SURGERY     BILAT   FAMILY HISTORY Family History  Problem Relation Age of Onset   Heart disease Mother 10       stents   Colon cancer Maternal Grandfather 59   Colon cancer Maternal Aunt 65   Colon cancer Maternal Uncle        dx in his 42s   Breast cancer Paternal Aunt 3   Breast cancer Cousin 55       paternal first cousin   Lung cancer Paternal Grandfather    Colon cancer Maternal Uncle    Head & neck cancer Maternal Uncle        oral cancer   SOCIAL HISTORY Social History   Tobacco Use   Smoking status: Former    Current packs/day: 0.00    Average packs/day: 1 pack/day for 45.0 years (45.0 ttl pk-yrs)    Types: Cigarettes    Start date: 02/23/1972    Quit date: 02/22/2017    Years since quitting: 7.5   Smokeless tobacco: Never  Vaping Use   Vaping status: Never Used  Substance Use Topics   Alcohol use: No    Alcohol/week: 0.0 standard drinks of alcohol   Drug use: No       OPHTHALMIC EXAM: Base Eye Exam     Visual Acuity (Snellen -  Linear)       Right Left   Dist cc 20/40 CF at 3'   Dist ph cc NI NI    Correction: Glasses         Tonometry (Tonopen, 12:54 PM)       Right Left   Pressure 15 16         Pupils  Dark Light Shape React APD   Right 3 2 Round Brisk None   Left 3 2 Round Brisk None         Visual Fields       Left Right    Full Full         Extraocular Movement       Right Left    Full, Ortho Full, Ortho         Neuro/Psych     Oriented x3: Yes   Mood/Affect: Normal         Dilation     Both eyes: 1.0% Mydriacyl, 2.5% Phenylephrine  @ 12:52 PM           Slit Lamp and Fundus Exam     Slit Lamp Exam       Right Left   Lids/Lashes Dermatochalasis - upper lid Dermatochalasis - upper lid, mild MGD   Conjunctiva/Sclera nasal and temporal pinguecula nasal and temporal pinguecula   Cornea arcus, well healed cataract wound, 2-3+ Punctate epithelial erosions Trace Punctate epithelial erosions, arcus, well healed cataract wound   Anterior Chamber deep, narrow temporal angle, 0.5+ fine cell and pigment deep, narrow temporal angle   Iris Round and moderately dilated, mild anterior bowing Round and moderately dilated, mild anterior bowing   Lens PC IOL in good position PC IOL in good position   Anterior Vitreous mild syneresis, Posterior vitreous detachment mild syneresis, Posterior vitreous detachment         Fundus Exam       Right Left   Disc Pink and Sharp, Compact, mild PPA Mild Pallor, Sharp rim, +elevation   C/D Ratio 0.0 0.1   Macula Blunted foveal reflex, +CNV with pigment clumping/ring, central IRH - stably improved, central SRF and edema -- improved, RPE mottling and clumping, Drusen, punctate heme on pigment ring -- stably resolved, no heme, +subretinal fibrosis, trace cystic changes--slightly improved Blunted foveal reflex, large area of GA with disciform scar and subretinal fibrosis, central pigment clumping, no heme   Vessels attenuated, Tortuous  attenuated, mild tortuosity, mild AV crossing changes   Periphery Attached, scattered reticular degeneration, paving stone degeneration, no heme Attached, scattered reticular degeneration, scattered paving stone degeneration, No heme           Refraction     Wearing Rx       Sphere Cylinder Axis   Right -6.50 +2.00 152   Left -5.25 +1.00 018    Type: SVL           IMAGING AND PROCEDURES  Imaging and Procedures for 08/26/2024  OCT, Retina - OU - Both Eyes       Right Eye Quality was good. Central Foveal Thickness: 270. Progression has improved. Findings include no IRF, no SRF, abnormal foveal contour, subretinal hyper-reflective material, choroidal neovascular membrane, intraretinal fluid, pigment epithelial detachment, subretinal fluid (persistent IRF / cystic changes overlying central SRHM/CNV--slightly improved).   Left Eye Quality was good. Central Foveal Thickness: 238. Progression has been stable. Findings include no IRF, no SRF, abnormal foveal contour, subretinal hyper-reflective material, lamellar hole, outer retinal atrophy (large central disciform scar with ORA, foveal notch, trace cystic changes and temporal edema -- all stable).   Notes *Images captured and stored on drive  Diagnosis / Impression:  exu ARMD OU OD: persistent IRF / cystic changes overlying central SRHM/CNV--slightly improved OS: large central disciform scar with ORA, Foveal notch, trace cystic changes and temporal edema -- all stable  Clinical management:  See below  Abbreviations: NFP - Normal foveal profile. CME - cystoid macular edema. PED - pigment epithelial detachment. IRF - intraretinal fluid. SRF - subretinal fluid. EZ - ellipsoid zone. ERM - epiretinal membrane. ORA - outer retinal atrophy. ORT - outer retinal tubulation. SRHM - subretinal hyper-reflective material. IRHM - intraretinal hyper-reflective material      Intravitreal Injection, Pharmacologic Agent - OD - Right Eye        Time Out 08/26/2024. 1:27 PM. Confirmed correct patient, procedure, site, and patient consented.   Anesthesia Topical anesthesia was used. Anesthetic medications included Lidocaine  2%, Proparacaine 0.5%.   Procedure Preparation included 5% betadine to ocular surface, eyelid speculum. A supplied (32g) needle was used.   Injection: 6 mg faricimab -svoa 6 MG/0.05ML   Route: Intravitreal, Site: Right Eye   NDC: 49757-903-93, Lot: [id number], Expiration date: 09/23/2025, Waste: 0 mL   Post-op Post injection exam found visual acuity of at least counting fingers. The patient tolerated the procedure well. There were no complications. The patient received written and verbal post procedure care education. Post injection medications were not given.            ASSESSMENT/PLAN:   ICD-10-CM   1. Exudative age-related macular degeneration of right eye with active choroidal neovascularization (HCC)  H35.3211 OCT, Retina - OU - Both Eyes    Intravitreal Injection, Pharmacologic Agent - OD - Right Eye    faricimab -svoa (VABYSMO ) 6mg /0.28mL intravitreal injection    2. Exudative age-related macular degeneration of left eye with active choroidal neovascularization (HCC)  H35.3221     3. Essential hypertension  I10     4. Hypertensive retinopathy of both eyes  H35.033     5. Pseudophakia, both eyes  Z96.1      1. Exudative age related macular degeneration OD  - delayed f/u (11.20.24 to 01.06.25): 6.5 wks instead of 4 -- death in family - lost to follow up from 8 weeks to 7 months (12.19.23 to 07.02.24) due to cardiac issues -- vision decreased to 20/100 OD on 07.02.24  - pt initially presented w/ 2 wk history of decreased vision OD (12.21.2021) - history of intravitreal injections OS with Dr. Elner in 2017 and earlier -- pt reports stopping visits after the development of a corneal abrasion from a lid speculum. - s/p IVA OD #1 (12.21.21), #2 (01.24.22), #3 (2.21.22), #4 (03.22.22), #5 (4.26.22),  #6 (5.31.22), #7 (7.12.22), #8 (8.16.22), #9 (9.20.22), #10 (10.25.22), #11 (12.6.22), #12 (01.10.23), #13 (02.14.23), #14 (03.21.23), #15 (04.25.23), #16 06.06.23), #17 (08.01.23), #18 (09.12.23), #19 (10.31.23), #20 (12.19.23) -- IVA resistance  - s/p IVE OD #1 (07.02.24), #2 (07.30.24),#3 (08.27.24), #4 (09.25.24), #5 (10.23.24), #6 (11.20.24), #7 (01.06.25), #8 (02.03.25), #9 (03.11.25) -- IVE resistance  - s/p IVV OD #1 (04.15.25), #2 (05.20.25), #3 (06.24.25), #4 (07.29.25)  **history of increased SRF at 6 wk interval, noted on 12.6.22**  **history of increased SRF at 8 wk interval, noted on 08.01.23** - OCT shows persistent IRF / cystic changes overlying central SRHM/CNV--slightly improved at 5 weeks  - BCVA OD 20/40 - stable             - Recommend IVV OD #5 today, 09.02.25 w/ f/u in 4-5 wks  - RBA of procedure discussed, questions answered  - IVE informed consent obtained and signed, 07.02.24 (OD)  - see procedure note  - f/u in 4-5 wks -- DFE/OCT, possible injection   2. Exudative age related macular degeneration, OS - OS with history inactive disciform scar, but new focal IRH noted  5.31.22 -- stably resolved on exam today - s/p IVA OS #1 (05.31.22), #2 (07.12.22), #3 (8.15.22), #4 (9.20.22) for focal [facility] - history of intravitreal injections OS with Dr. Elner in 2017 and earlier -- pt reports stopping visits after the development of a corneal abrasion from a lid speculum  - before being referred here, had not seen a retina specialist since 2017  - exam and OCT OS shows large central disciform scar with ORA, Foveal notch, trace cystic changes and temporal edema -- all stable   - BCVA CF OS -- stable - recommend holding IVA OS today - f/u 4-5 weeks DFE, OCT  3,4. Hypertensive retinopathy OU - discussed importance of tight BP control - monitor  5. Pseudophakia OU  - s/p CE/IOL (Dr. Fleeta)  - IOL in good position, doing well  - monitor  Ophthalmic Meds Ordered this visit:  Meds  ordered this encounter  Medications   faricimab -svoa (VABYSMO ) 6mg /0.43mL intravitreal injection     Return for 4-5wks exu ARMD OD, DFE, OCT, Possible Injxn.  There are no Patient Instructions on file for this visit.  This document serves as a record of services personally performed by Redell JUDITHANN Hans, MD, PhD. It was created on their behalf by Wanda GEANNIE Keens, COT an ophthalmic technician. The creation of this record is the provider's dictation and/or activities during the visit.    Electronically signed by:  Wanda GEANNIE Keens, COT  08/26/24 10:14 PM  This document serves as a record of services personally performed by Redell JUDITHANN Hans, MD, PhD. It was created on their behalf by Almetta Pesa, an ophthalmic technician. The creation of this record is the provider's dictation and/or activities during the visit.    Electronically signed by: Almetta Pesa, OA, 08/26/24  10:14 PM  Redell JUDITHANN Hans, M.D., Ph.D. Diseases & Surgery of the Retina and Vitreous [facility]  I have reviewed the above documentation for accuracy and completeness, and I agree with the above. Redell JUDITHANN Hans, M.D., Ph.D. 08/26/24 10:19 PM   Abbreviations: M myopia (nearsighted); A astigmatism; H hyperopia (farsighted); P presbyopia; Mrx spectacle prescription;  CTL contact lenses; OD right eye; OS left eye; OU both eyes  XT exotropia; ET esotropia; PEK punctate epithelial keratitis; PEE punctate epithelial erosions; DES dry eye syndrome; MGD meibomian gland dysfunction; ATs artificial tears; PFAT's preservative free artificial tears; NSC nuclear sclerotic cataract; PSC posterior subcapsular cataract; ERM epi-retinal membrane; PVD posterior vitreous detachment; RD retinal detachment; DM diabetes mellitus; DR diabetic retinopathy; NPDR non-proliferative diabetic retinopathy; PDR proliferative diabetic retinopathy; CSME clinically significant macular edema; DME diabetic macular edema; dbh dot  blot hemorrhages; CWS cotton wool spot; POAG primary open angle glaucoma; C/D cup-to-disc ratio; HVF humphrey visual field; GVF goldmann visual field; OCT optical coherence tomography; IOP intraocular pressure; BRVO Branch retinal vein occlusion; CRVO central retinal vein occlusion; CRAO central retinal artery occlusion; BRAO branch retinal artery occlusion; RT retinal tear; SB scleral buckle; PPV pars plana vitrectomy; VH Vitreous hemorrhage; PRP panretinal laser photocoagulation; IVK intravitreal kenalog; VMT vitreomacular traction; MH Macular hole;  NVD neovascularization of the disc; NVE neovascularization elsewhere; AREDS age related eye disease study; ARMD age related macular degeneration; POAG primary open angle glaucoma; EBMD epithelial/anterior basement membrane dystrophy; ACIOL anterior chamber intraocular lens; IOL intraocular lens; PCIOL posterior chamber intraocular lens; Phaco/IOL phacoemulsification with intraocular lens placement; PRK photorefractive keratectomy; LASIK laser assisted in situ keratomileusis; HTN hypertension; DM diabetes mellitus; COPD chronic obstructive pulmonary disease

## 2024-08-24 DIAGNOSIS — I10 Essential (primary) hypertension: Secondary | ICD-10-CM | POA: Diagnosis not present

## 2024-08-24 DIAGNOSIS — I5022 Chronic systolic (congestive) heart failure: Secondary | ICD-10-CM | POA: Diagnosis not present

## 2024-08-24 DIAGNOSIS — Z853 Personal history of malignant neoplasm of breast: Secondary | ICD-10-CM | POA: Diagnosis not present

## 2024-08-24 DIAGNOSIS — I700 Atherosclerosis of aorta: Secondary | ICD-10-CM | POA: Diagnosis not present

## 2024-08-24 DIAGNOSIS — N1832 Chronic kidney disease, stage 3b: Secondary | ICD-10-CM | POA: Diagnosis not present

## 2024-08-24 DIAGNOSIS — J439 Emphysema, unspecified: Secondary | ICD-10-CM | POA: Diagnosis not present

## 2024-08-26 ENCOUNTER — Encounter (INDEPENDENT_AMBULATORY_CARE_PROVIDER_SITE_OTHER): Payer: Self-pay | Admitting: Ophthalmology

## 2024-08-26 ENCOUNTER — Ambulatory Visit (INDEPENDENT_AMBULATORY_CARE_PROVIDER_SITE_OTHER): Admitting: Ophthalmology

## 2024-08-26 DIAGNOSIS — H353211 Exudative age-related macular degeneration, right eye, with active choroidal neovascularization: Secondary | ICD-10-CM

## 2024-08-26 DIAGNOSIS — H35033 Hypertensive retinopathy, bilateral: Secondary | ICD-10-CM | POA: Diagnosis not present

## 2024-08-26 DIAGNOSIS — Z961 Presence of intraocular lens: Secondary | ICD-10-CM

## 2024-08-26 DIAGNOSIS — I10 Essential (primary) hypertension: Secondary | ICD-10-CM | POA: Diagnosis not present

## 2024-08-26 DIAGNOSIS — H353231 Exudative age-related macular degeneration, bilateral, with active choroidal neovascularization: Secondary | ICD-10-CM | POA: Diagnosis not present

## 2024-08-26 DIAGNOSIS — H353221 Exudative age-related macular degeneration, left eye, with active choroidal neovascularization: Secondary | ICD-10-CM

## 2024-08-26 MED ORDER — FARICIMAB-SVOA 6 MG/0.05ML IZ SOSY
6.0000 mg | PREFILLED_SYRINGE | INTRAVITREAL | Status: AC | PRN
  Administered 2024-08-26: 6 mg via INTRAVITREAL

## 2024-09-04 DIAGNOSIS — I700 Atherosclerosis of aorta: Secondary | ICD-10-CM | POA: Diagnosis not present

## 2024-09-04 DIAGNOSIS — I10 Essential (primary) hypertension: Secondary | ICD-10-CM | POA: Diagnosis not present

## 2024-09-04 DIAGNOSIS — J439 Emphysema, unspecified: Secondary | ICD-10-CM | POA: Diagnosis not present

## 2024-09-04 DIAGNOSIS — N1832 Chronic kidney disease, stage 3b: Secondary | ICD-10-CM | POA: Diagnosis not present

## 2024-09-10 NOTE — Progress Notes (Signed)
 [facility] - Clinic Note  09/23/2024     CHIEF COMPLAINT Patient presents for Retina Follow Up  HISTORY OF PRESENT ILLNESS: Kimberly Waters is a 78 y.o. female who presents to the clinic today for:  HPI     Retina Follow Up   Patient presents with  Wet AMD.  In right eye.  This started 4 weeks ago.        Comments   Patient here for 4 weeks retina follow up for exu ARMD OD. Patient states vision about the same. No eye pain.       Last edited by Orval Asberry RAMAN, COA on 09/23/2024  1:00 PM.      Pt states    Referring physician: Dwight Trula SQUIBB, MD [address] [place],  [place] [address]  HISTORICAL INFORMATION:   Selected notes from the MEDICAL RECORD NUMBER Referred by Dr. Ladora for eval of decreased VA OD.   CURRENT MEDICATIONS: No current outpatient medications on file. (Ophthalmic Drugs)   No current facility-administered medications for this visit. (Ophthalmic Drugs)   Current Outpatient Medications (Other)  Medication Sig   acetaminophen  (TYLENOL ) 325 MG tablet Take 2 tablets (650 mg total) by mouth every 4 (four) hours as needed for headache or mild pain.   arformoterol  (BROVANA ) 15 MCG/2ML NEBU Take 2 mLs (15 mcg total) by nebulization 2 (two) times daily.   betamethasone  dipropionate 0.05 % cream 1 application   buPROPion  (WELLBUTRIN  SR) 150 MG 12 hr tablet Take 150 mg by mouth daily.    clobetasol  ointment (TEMOVATE ) 0.05 % as needed.   escitalopram  (LEXAPRO ) 10 MG tablet Take 1 tablet (10 mg total) by mouth daily.   ezetimibe  (ZETIA ) 10 MG tablet Take 1 tablet (10 mg total) by mouth daily.   furosemide  (LASIX ) 40 MG tablet Take 1 tablet (40 mg total) by mouth daily.   ipratropium (ATROVENT ) 0.06 % nasal spray Place 2 sprays into the nose 4 (four) times daily.   ketoconazole  (NIZORAL ) 2 % cream Apply 1 application topically daily.   levocetirizine (XYZAL ) 5 MG tablet Take 5 mg by mouth as needed for allergies.    levothyroxine  (SYNTHROID ) 112 MCG tablet Take 112 mcg by mouth daily before breakfast.   melatonin 5 MG TABS Take 1 tablet (5 mg total) by mouth at bedtime.   mometasone  (ELOCON ) 0.1 % ointment Apply topically as needed.   nitroGLYCERIN  (NITROSTAT ) 0.4 MG SL tablet Place 0.4 mg under the tongue every 5 (five) minutes as needed for chest pain.   omeprazole  (PRILOSEC) 20 MG capsule Take 20 mg by mouth daily.   potassium chloride  SA (KLOR-CON  M) 20 MEQ tablet TAKE 2 TABLETS BY MOUTH DAILY   revefenacin  (YUPELRI ) 175 MCG/3ML nebulizer solution Take 3 mLs (175 mcg total) by nebulization daily.   rosuvastatin  (CRESTOR ) 20 MG tablet Take 1 tablet (20 mg total) by mouth daily.   spironolactone  (ALDACTONE ) 25 MG tablet TAKE 1 TABLET BY MOUTH EVERY DAY   tacrolimus (PROTOPIC) 0.1 % ointment Apply topically as needed.   albuterol  (PROVENTIL ,VENTOLIN ) 90 MCG/ACT inhaler Inhale 2 puffs into the lungs every 4 (four) hours as needed. (Patient taking differently: Inhale 2 puffs into the lungs every 4 (four) hours as needed for wheezing or shortness of breath.)   amLODipine  (NORVASC ) 5 MG tablet Take 1 tablet (5 mg total) by mouth daily. (Patient not taking: Reported on 09/23/2024)   No current facility-administered medications for this visit. (Other)  REVIEW OF SYSTEMS: ROS   Positive for: Neurological, Eyes, Respiratory Negative for: Constitutional, Gastrointestinal, Skin, Genitourinary, Musculoskeletal, HENT, Endocrine, Cardiovascular, Psychiatric, Allergic/Imm, Heme/Lymph Last edited by Orval Asberry RAMAN, COA on 09/23/2024  1:00 PM.        ALLERGIES Allergies  Allergen Reactions   Latex Other (See Comments)    Other reaction(s): Other (See Comments) Tears skin.   Adhesive [Tape] Other (See Comments)    (skin tears)  Tolerates PAPER TAPE   Codeine  Nausea Only   Other Nausea Only    Anesthesia--nausea    PAST MEDICAL HISTORY Past Medical History:  Diagnosis Date   Allergy    Breast cancer  (HCC)    Colon polyps    Depression    Dyspnea    SEASONAL   Family history of breast cancer    Family history of colon cancer    GERD (gastroesophageal reflux disease)    History of radiation therapy 06/05/17-07/20/17   right chest wall 50.4 Gy in 28 fractions, right axillary nodal region 45 Gy in 25 fractions   Hyperlipidemia    Hypertension    Hypertensive retinopathy    Hypothyroidism    Macular degeneration    Peripheral neuropathy 06/18/2019   PONV (postoperative nausea and vomiting)    Past Surgical History:  Procedure Laterality Date   ABDOMINAL HYSTERECTOMY     CATARACT EXTRACTION, BILATERAL Bilateral    Dr. [facility]   KNEE SURGERY Right    LEFT HEART CATH AND CORONARY ANGIOGRAPHY N/A 02/02/2023   Procedure: LEFT HEART CATH AND CORONARY ANGIOGRAPHY;  Surgeon: Burnard Debby LABOR, MD;  Location: [facility] INVASIVE CV LAB;  Service: Cardiovascular;  Laterality: N/A;   MASTECTOMY W/ SENTINEL NODE BIOPSY Right 03/05/2017   Procedure: BILATERAL TOTAL MASTECTOMIES WITH RIGHT SENTINEL LYMPH NODE BIOPSIES;  Surgeon: Morene Olives, MD;  Location: [facility] OR;  Service: General;  Laterality: Right;   SHOULDER SURGERY     BILAT   FAMILY HISTORY Family History  Problem Relation Age of Onset   Heart disease Mother 36       stents   Colon cancer Maternal Grandfather 59   Colon cancer Maternal Aunt 65   Colon cancer Maternal Uncle        dx in his 82s   Breast cancer Paternal Aunt 50   Breast cancer Cousin 6       paternal first cousin   Lung cancer Paternal Grandfather    Colon cancer Maternal Uncle    Head & neck cancer Maternal Uncle        oral cancer   SOCIAL HISTORY Social History   Tobacco Use   Smoking status: Former    Current packs/day: 0.00    Average packs/day: 1 pack/day for 45.0 years (45.0 ttl pk-yrs)    Types: Cigarettes    Start date: 02/23/1972    Quit date: 02/22/2017    Years since quitting: 7.5   Smokeless tobacco: Never  Vaping Use    Vaping status: Never Used  Substance Use Topics   Alcohol use: No    Alcohol/week: 0.0 standard drinks of alcohol   Drug use: No       OPHTHALMIC EXAM: Base Eye Exam     Visual Acuity (Snellen - Linear)       Right Left   Dist cc 20/40 CF at 3'    Correction: Glasses         Tonometry (Tonopen, 12:58 PM)       Right Left  Pressure 16 16         Pupils       Dark Light Shape React APD   Right 3 2 Round Brisk None   Left 3 2 Round Brisk None         Visual Fields (Counting fingers)       Left Right    Full Full         Extraocular Movement       Right Left    Full, Ortho Full, Ortho         Neuro/Psych     Oriented x3: Yes   Mood/Affect: Normal         Dilation     Both eyes: 1.0% Mydriacyl, 2.5% Phenylephrine  @ 12:58 PM           Refraction     Wearing Rx       Sphere Cylinder Axis   Right -6.50 +2.00 152   Left -5.25 +1.00 018    Type: SVL           IMAGING AND PROCEDURES  Imaging and Procedures for 09/23/2024          ASSESSMENT/PLAN:   ICD-10-CM   1. Exudative age-related macular degeneration of right eye with active choroidal neovascularization (HCC)  H35.3211 OCT, Retina - OU - Both Eyes    2. Exudative age-related macular degeneration of left eye with active choroidal neovascularization (HCC)  H35.3221     3. Essential hypertension  I10     4. Hypertensive retinopathy of both eyes  H35.033     5. Pseudophakia, both eyes  Z96.1       1. Exudative age related macular degeneration OD  - delayed f/u (11.20.24 to 01.06.25): 6.5 wks instead of 4 -- death in family - lost to follow up from 8 weeks to 7 months (12.19.23 to 07.02.24) due to cardiac issues -- vision decreased to 20/100 OD on 07.02.24  - pt initially presented w/ 2 wk history of decreased vision OD (12.21.2021) - history of intravitreal injections OS with Dr. Elner in 2017 and earlier -- pt reports stopping visits after the development of a  corneal abrasion from a lid speculum. - s/p IVA OD #1 (12.21.21), #2 (01.24.22), #3 (2.21.22), #4 (03.22.22), #5 (4.26.22), #6 (5.31.22), #7 (7.12.22), #8 (8.16.22), #9 (9.20.22), #10 (10.25.22), #11 (12.6.22), #12 (01.10.23), #13 (02.14.23), #14 (03.21.23), #15 (04.25.23), #16 06.06.23), #17 (08.01.23), #18 (09.12.23), #19 (10.31.23), #20 (12.19.23) -- IVA resistance ==================  - s/p IVE OD #1 (07.02.24), #2 (07.30.24),#3 (08.27.24), #4 (09.25.24), #5 (10.23.24), #6 (11.20.24), #7 (01.06.25), #8 (02.03.25), #9 (03.11.25) -- IVE resistance  ==================  - s/p IVV OD #1 (04.15.25), #2 (05.20.25), #3 (06.24.25), #4 (07.29.25), #5 (09.02.25)  **history of increased SRF at 6 wk interval, noted on 12.6.22**  **history of increased SRF at 8 wk interval, noted on 08.01.23** - OCT shows persistent IRF / cystic changes overlying central SRHM/CNV--slightly improved at 4 weeks  - BCVA OD 20/40 - stable             - Recommend IVV OD #6 today, 09.30.25 w/ f/u in 4-5 wks  - RBA of procedure discussed, questions answered  - IVE informed consent obtained and signed, 07.02.24 (OD)  - see procedure note  - f/u in 4-5 wks -- DFE/OCT, possible injection   2. Exudative age related macular degeneration, OS - OS with history inactive disciform scar, but new focal IRH noted 5.31.22 -- stably resolved on exam today -  s/p IVA OS #1 (05.31.22), #2 (07.12.22), #3 (8.15.22), #4 (9.20.22) for focal [facility] - history of intravitreal injections OS with Dr. Elner in 2017 and earlier -- pt reports stopping visits after the development of a corneal abrasion from a lid speculum  - before being referred here, had not seen a retina specialist since 2017  - exam and OCT OS shows large central disciform scar with ORA, Foveal notch, trace cystic changes and temporal edema -- all stable   - BCVA CF OS -- stable - recommend holding IVA OS today - f/u 4-5 weeks DFE, OCT  3,4. Hypertensive retinopathy OU - discussed  importance of tight BP control - monitor  5. Pseudophakia OU  - s/p CE/IOL (Dr. Fleeta)  - IOL in good position, doing well  - monitor  Ophthalmic Meds Ordered this visit:  No orders of the defined types were placed in this encounter.    No follow-ups on file.  There are no Patient Instructions on file for this visit.  This document serves as a record of services personally performed by Redell JUDITHANN Hans, MD, PhD. It was created on their behalf by Wanda GEANNIE Keens, COT an ophthalmic technician. The creation of this record is the provider's dictation and/or activities during the visit.    Electronically signed by:  Wanda GEANNIE Keens, COT  09/23/24 1:11 PM  This document serves as a record of services personally performed by Redell JUDITHANN Hans, MD, PhD. It was created on their behalf by Almetta Pesa, an ophthalmic technician. The creation of this record is the provider's dictation and/or activities during the visit.    Electronically signed by: Almetta Pesa, OA, 09/23/24  1:31 PM    Redell JUDITHANN Hans, M.D., Ph.D. Diseases & Surgery of the Retina and Vitreous [facility]   Abbreviations: M myopia (nearsighted); A astigmatism; H hyperopia (farsighted); P presbyopia; Mrx spectacle prescription;  CTL contact lenses; OD right eye; OS left eye; OU both eyes  XT exotropia; ET esotropia; PEK punctate epithelial keratitis; PEE punctate epithelial erosions; DES dry eye syndrome; MGD meibomian gland dysfunction; ATs artificial tears; PFAT's preservative free artificial tears; NSC nuclear sclerotic cataract; PSC posterior subcapsular cataract; ERM epi-retinal membrane; PVD posterior vitreous detachment; RD retinal detachment; DM diabetes mellitus; DR diabetic retinopathy; NPDR non-proliferative diabetic retinopathy; PDR proliferative diabetic retinopathy; CSME clinically significant macular edema; DME diabetic macular edema; dbh dot blot hemorrhages; CWS cotton wool spot;  POAG primary open angle glaucoma; C/D cup-to-disc ratio; HVF humphrey visual field; GVF goldmann visual field; OCT optical coherence tomography; IOP intraocular pressure; BRVO Branch retinal vein occlusion; CRVO central retinal vein occlusion; CRAO central retinal artery occlusion; BRAO branch retinal artery occlusion; RT retinal tear; SB scleral buckle; PPV pars plana vitrectomy; VH Vitreous hemorrhage; PRP panretinal laser photocoagulation; IVK intravitreal kenalog; VMT vitreomacular traction; MH Macular hole;  NVD neovascularization of the disc; NVE neovascularization elsewhere; AREDS age related eye disease study; ARMD age related macular degeneration; POAG primary open angle glaucoma; EBMD epithelial/anterior basement membrane dystrophy; ACIOL anterior chamber intraocular lens; IOL intraocular lens; PCIOL posterior chamber intraocular lens; Phaco/IOL phacoemulsification with intraocular lens placement; PRK photorefractive keratectomy; LASIK laser assisted in situ keratomileusis; HTN hypertension; DM diabetes mellitus; COPD chronic obstructive pulmonary disease

## 2024-09-22 ENCOUNTER — Telehealth: Payer: Self-pay | Admitting: Cardiology

## 2024-09-22 NOTE — Telephone Encounter (Signed)
 Pt c/o medication issue:  1. Name of Medication:   amLODipine  (NORVASC ) 5 MG tablet   2. How are you currently taking this medication (dosage and times per day)?   3. Are you having a reaction (difficulty breathing--STAT)?   4. What is your medication issue?   Patient stated she stopped taking this medication due to neuropathy in her hand and arms.  Patient stated her BP has now been increasing and wants advice on next steps as she is planning to go out of town.

## 2024-09-22 NOTE — Telephone Encounter (Signed)
 Spoke with Pt. Stopped amlodipine  a month ago because she states it makes her arms hurt. States today she threw up lunch but has no other symptoms that are out  of the ordinary for her. Bp- 177/77 this morning and the 124/70 HR- states it is in the 50's . Pt states she just feels like something is very wrong. Pt has hx of torsade de pointes through a change in thyroid  replacement caused improvement in QT intervals. Pt is going out of town Monday and would like to be seen. 911/ED precaution given and Appt was made with Jodie Passey, PA-C on Wednesday.

## 2024-09-23 ENCOUNTER — Telehealth: Payer: Self-pay | Admitting: Cardiology

## 2024-09-23 ENCOUNTER — Ambulatory Visit (INDEPENDENT_AMBULATORY_CARE_PROVIDER_SITE_OTHER): Admitting: Ophthalmology

## 2024-09-23 ENCOUNTER — Encounter (INDEPENDENT_AMBULATORY_CARE_PROVIDER_SITE_OTHER): Payer: Self-pay | Admitting: Ophthalmology

## 2024-09-23 DIAGNOSIS — Z961 Presence of intraocular lens: Secondary | ICD-10-CM

## 2024-09-23 DIAGNOSIS — I10 Essential (primary) hypertension: Secondary | ICD-10-CM

## 2024-09-23 DIAGNOSIS — J439 Emphysema, unspecified: Secondary | ICD-10-CM | POA: Diagnosis not present

## 2024-09-23 DIAGNOSIS — H35033 Hypertensive retinopathy, bilateral: Secondary | ICD-10-CM | POA: Diagnosis not present

## 2024-09-23 DIAGNOSIS — N1832 Chronic kidney disease, stage 3b: Secondary | ICD-10-CM | POA: Diagnosis not present

## 2024-09-23 DIAGNOSIS — H353231 Exudative age-related macular degeneration, bilateral, with active choroidal neovascularization: Secondary | ICD-10-CM

## 2024-09-23 DIAGNOSIS — I5022 Chronic systolic (congestive) heart failure: Secondary | ICD-10-CM | POA: Diagnosis not present

## 2024-09-23 DIAGNOSIS — I700 Atherosclerosis of aorta: Secondary | ICD-10-CM | POA: Diagnosis not present

## 2024-09-23 DIAGNOSIS — H353211 Exudative age-related macular degeneration, right eye, with active choroidal neovascularization: Secondary | ICD-10-CM

## 2024-09-23 DIAGNOSIS — Z853 Personal history of malignant neoplasm of breast: Secondary | ICD-10-CM | POA: Diagnosis not present

## 2024-09-23 DIAGNOSIS — H353221 Exudative age-related macular degeneration, left eye, with active choroidal neovascularization: Secondary | ICD-10-CM

## 2024-09-23 MED ORDER — SPIRONOLACTONE 25 MG PO TABS: 25.0000 mg | ORAL_TABLET | Freq: Every day | ORAL | 0 refills | Status: DC

## 2024-09-23 MED ORDER — FARICIMAB-SVOA 6 MG/0.05ML IZ SOSY
6.0000 mg | PREFILLED_SYRINGE | INTRAVITREAL | Status: AC | PRN
  Administered 2024-09-23: 6 mg via INTRAVITREAL

## 2024-09-23 NOTE — Telephone Encounter (Signed)
 Pt's medication was sent to pt's pharmacy as requested. Confirmation received.

## 2024-09-23 NOTE — Telephone Encounter (Signed)
 This prescription was previously ordered/filled by Dr. Fernande. Patient has follow up with Delon Hoover, NP on 10/27 for her 1 yr f/u as patient has graduated to see EP as needed per Jodie Passey, PA.   *STAT* If patient is at the pharmacy, call can be transferred to refill team.   1. Which medications need to be refilled? (please list name of each medication and dose if known) spironolactone  (ALDACTONE ) 25 MG tablet   2. Which pharmacy/location (including street and city if local pharmacy) is medication to be sent to? [id number] - [place],  - [address]   3. Do they need a 30 day or 90 day supply? 90 day    Patient is out of this medication.

## 2024-09-24 ENCOUNTER — Ambulatory Visit: Admitting: Student

## 2024-10-04 DIAGNOSIS — J439 Emphysema, unspecified: Secondary | ICD-10-CM | POA: Diagnosis not present

## 2024-10-04 DIAGNOSIS — I10 Essential (primary) hypertension: Secondary | ICD-10-CM | POA: Diagnosis not present

## 2024-10-04 DIAGNOSIS — N1832 Chronic kidney disease, stage 3b: Secondary | ICD-10-CM | POA: Diagnosis not present

## 2024-10-04 DIAGNOSIS — I700 Atherosclerosis of aorta: Secondary | ICD-10-CM | POA: Diagnosis not present

## 2024-10-15 NOTE — Progress Notes (Signed)
 Cardiology Office Note   Date:  10/20/2024  ID:  Kimberly Waters, DOB [birth year], MRN [id number] PCP: Dwight Trula SQUIBB, MD  [facility] Providers Cardiologist:  Oneil Parchment, MD     History of Present Illness Kimberly Waters is a 78 y.o. female with a past medical history of NSVT, mild nonobstructive CAD, atrial septal hypertrophy, history of breast cancer chemo and radiation 2018, COPD-/Dr. Geronimo, tobacco abuse, LBBB  02/09/2023 cardiac MRI EF 41%, no delayed myocardial enhancement 02/02/2023 cardiac cath normal coronary arteries 01/24/2023 echo EF 50 to 55%, no RWMA, mild concentric LVH, grade 1 DD, trivial MR 07/26/2020 coronary CTA calcium  score 131, 72nd percentile, FFR negative, Lipomatous hypertrophy of interatrial septum   [facility]  [facility] following a hospitalization for heart failure and COPD exacerbation, she required diuresing of approximately 4 L, medication was restarted including lisinopril  and she was subsequently discharged.  In 2021 she underwent a coronary CTA revealing a calcium  score of 131, 72nd percentile, lipomatous hypertrophy of her interatrial septum.  In 2024 she was admitted to the hospital after an episode of syncope in the setting of torsades/QT prolongation, workup was relatively unrevealing cardiac wise and it was felt this was in the setting of thyroid  abnormality>> d/c home on LifeVest >> cMRI revealed EF 41% without delayed myocardia enhancement. Most recently evaluated by Dr. Fernande on 06/19/2023 and advised to have follow-up with EP on a as needed basis.  She presents today accompanied by her husband for follow-up.  She was evaluated in the emergency department over the weekend for hypertension, it seems as though she is self discontinued several of her medications.  She states amlodipine  was causing her peripheral neuropathy, since she discontinued she has not been bothered by this anymore.  She also has been out of her  spironolactone  for many months, she questions whether she can just restart her lisinopril /HCTZ.  She has Lasix  that she takes most days, she does not take it if she has trips scheduled or appointments during the day--she is not sure why she is on this, she states she never has issues with pedal edema.  She denies chest pain, palpitations, dyspnea, pnd, orthopnea, n, v, dizziness, syncope, edema, weight gain, or early satiety.   ROS: Review of Systems  Respiratory:  Positive for shortness of breath (Baseline).   Musculoskeletal:  Positive for back pain, falls (None in the last few months) and joint pain.  All other systems reviewed and are negative.    Studies Reviewed      Cardiac Studies & Procedures   ______________________________________________________________________________________________ CARDIAC CATHETERIZATION  CARDIAC CATHETERIZATION 02/02/2023  Conclusion Normal coronary arteries with a dominant RCA.  LVEDP 22 mmHg.  RECOMMENDATION: Continue medical therapy and EP evaluation for her VT in the setting of hypokalemia and continuation of statin therapy for hyperlipidemia.  Findings Coronary Findings Diagnostic  Dominance: Right  No diagnostic findings have been documented. Intervention  No interventions have been documented.   STRESS TESTS  MYOCARDIAL PERFUSION IMAGING 05/31/2018  Interpretation Summary  Nuclear stress EF: 59%.  Inferior and apical thnning consistent with prabable soft tissue attenuation and normal perfusion No significant ischemia or scar  Low risk   ECHOCARDIOGRAM  ECHOCARDIOGRAM COMPLETE 01/24/2023  Narrative ECHOCARDIOGRAM REPORT    Patient Name:   Kimberly Waters Date of Exam: 01/24/2023 Medical Rec #:  [id number]     Height:       62.0 in Accession #:    [id number]  Weight:       188.0 lb Date of Birth:  15-Oct-1946     BSA:          1.862 m Patient Age:    77 years      BP:           147/75 mmHg Patient Gender: F             HR:            64 bpm. Exam Location:  Inpatient  Procedure: 2D Echo, Cardiac Doppler and Color Doppler  Indications:    Syncope R55  History:        Patient has prior history of Echocardiogram examinations, most recent 03/29/2018. Risk Factors:Hypertension and Dyslipidemia.  Sonographer:    Annabella Fell RDCS Referring Phys: [id number] TIMOTHY S OPYD   Sonographer Comments: Suboptimal parasternal window and suboptimal subcostal window. Image acquisition challenging due to mastectomy. IMPRESSIONS   1. Left ventricular ejection fraction, by estimation, is 50 to 55%. Left ventricular ejection fraction by PLAX is 59 %. The left ventricle has low normal function. The left ventricle has no regional wall motion abnormalities. There is mild concentric left ventricular hypertrophy. Left ventricular diastolic parameters are consistent with Grade I diastolic dysfunction (impaired relaxation). 2. Right ventricular systolic function is normal. The right ventricular size is normal. 3. The mitral valve is grossly normal. Trivial mitral valve regurgitation. No evidence of mitral stenosis. 4. The aortic valve is tricuspid. Aortic valve regurgitation is not visualized. No aortic stenosis is present. 5. The inferior vena cava is normal in size with greater than 50% respiratory variability, suggesting right atrial pressure of 3 mmHg.  FINDINGS Left Ventricle: Left ventricular ejection fraction, by estimation, is 50 to 55%. Left ventricular ejection fraction by PLAX is 59 %. The left ventricle has low normal function. The left ventricle has no regional wall motion abnormalities. The left ventricular internal cavity size was normal in size. There is mild concentric left ventricular hypertrophy. Left ventricular diastolic parameters are consistent with Grade I diastolic dysfunction (impaired relaxation). Indeterminate filling pressures.  Right Ventricle: The right ventricular size is normal. No increase in right  ventricular wall thickness. Right ventricular systolic function is normal.  Left Atrium: Left atrial size was normal in size.  Right Atrium: Right atrial size was normal in size.  Pericardium: Trivial pericardial effusion is present. The pericardial effusion is circumferential.  Mitral Valve: The mitral valve is grossly normal. Trivial mitral valve regurgitation. No evidence of mitral valve stenosis.  Tricuspid Valve: The tricuspid valve is grossly normal. Tricuspid valve regurgitation is not demonstrated. No evidence of tricuspid stenosis.  Aortic Valve: The aortic valve is tricuspid. Aortic valve regurgitation is not visualized. No aortic stenosis is present.  Pulmonic Valve: The pulmonic valve was grossly normal. Pulmonic valve regurgitation is not visualized. No evidence of pulmonic stenosis.  Aorta: The aortic root and ascending aorta are structurally normal, with no evidence of dilitation.  Venous: The inferior vena cava is normal in size with greater than 50% respiratory variability, suggesting right atrial pressure of 3 mmHg.  IAS/Shunts: No atrial level shunt detected by color flow Doppler.   LEFT VENTRICLE PLAX 2D LV EF:         Left            Diastology ventricular     LV e' medial:    4.13 cm/s ejection        LV E/e' medial:  12.8 fraction by  LV e' lateral:   3.26 cm/s PLAX is 59      LV E/e' lateral: 16.2 %. LVIDd:         4.80 cm LVIDs:         3.30 cm LV PW:         1.10 cm LV IVS:        1.30 cm LVOT diam:     2.10 cm LV SV:         48 LV SV Index:   26 LVOT Area:     3.46 cm   RIGHT VENTRICLE RV S prime:     10.20 cm/s TAPSE (M-mode): 1.8 cm  LEFT ATRIUM             Index        RIGHT ATRIUM          Index LA diam:        3.50 cm 1.88 cm/m   RA Area:     5.80 cm LA Vol (A2C):   23.7 ml 12.73 ml/m  RA Volume:   8.32 ml  4.47 ml/m LA Vol (A4C):   23.1 ml 12.41 ml/m LA Biplane Vol: 24.4 ml 13.10 ml/m AORTIC VALVE LVOT Vmax:   89.40  cm/s LVOT Vmean:  61.900 cm/s LVOT VTI:    0.139 m  AORTA Ao Root diam: 2.80 cm Ao Asc diam:  3.10 cm  MITRAL VALVE MV Area (PHT): 2.16 cm     SHUNTS MV Decel Time: 352 msec     Systemic VTI:  0.14 m MV E velocity: 52.90 cm/s   Systemic Diam: 2.10 cm MV A velocity: 102.00 cm/s MV E/A ratio:  0.52  Annabella Scarce MD Electronically signed by Annabella Scarce MD Signature Date/Time: 01/24/2023/1:05:10 PM    Final      CT SCANS  CT CORONARY FRACTIONAL FLOW RESERVE DATA PREP 05/14/2020  Narrative EXAM: FFRCT ANALYSIS  CT FFR ANALYSIS  CLINICAL DATA:  78 year old with abnormal CT coronary  FINDINGS: FFRct analysis was performed on the original cardiac CT angiogram dataset. Diagrammatic representation of the FFRct analysis is provided in a separate PDF document in PACS. This dictation was created using the PDF document and an interactive 3D model of the results. 3D model is not available in the EMR/PACS. Normal FFR range is >0.80.  1. Left Main:  No significant stenosis.  2. LAD: No significant stenosis.  Distal LAD 0.81 3. LCX: No significant stenosis. 4. RCA: No significant stenosis.  IMPRESSION: 1. CT FFR analysis didn't show any significant stenosis. Distal LAD 0.81.  2.  Continue with aggressive medical management.   Electronically Signed By: Oneil Parchment MD On: 05/15/2020 07:03   CT CORONARY MORPH W/CTA COR W/SCORE 05/14/2020  Addendum 05/14/2020  4:47 PM ADDENDUM REPORT: 05/14/2020 16:44  CLINICAL DATA:  78 year old with known coronary calcification and chest discomfort.  EXAM: Cardiac/Coronary  CTA  TECHNIQUE: The patient was scanned on a [employer].  FINDINGS: A 110 kV prospective scan was triggered in the descending thoracic aorta at 111 HU's. Axial non-contrast 3 mm slices were carried out through the heart. The data set was analyzed on a dedicated work station and scored using the Agatson method. Gantry rotation  speed was 250 msecs and collimation was .6 mm. Beta blockade and 0.8 mg of sl NTG was given. The 3D data set was reconstructed in 5% intervals of the 67-82 % of the R-R cycle. Diastolic phases were analyzed on  a dedicated work station using MPR, MIP and VRT modes. The patient received 80 cc of contrast.  Aorta: Normal size. Mild root and descending calcifications/ atherosclerosis (dense arch atherosclerosis seen on chest CT). No dissection.  Aortic Valve:  Trileaflet.  No calcifications.  Coronary Arteries:  Normal coronary origin.  Right dominance.  RCA is a large dominant artery that gives rise to PDA and PLA. There is no plaque.  Left main is a large artery that gives rise to LAD and LCX arteries.  LAD is a large vessel that has proximal (25-49%) and scattered mid calcified lesions (up to 50-74% at bifurcation of large mid diagonal branch). Sending for FFR analysis.  LCX is a non-dominant artery that gives rise to one large OM1 branch. There is calcified proximal plaque 0-24% non flow limiting.  Other findings:  Normal pulmonary vein drainage into the left atrium with accessory right middle pulmonary vein.  Normal left atrial appendage without a thrombus.  Normal size of the pulmonary artery.  There is moderate pericardial fat diffusely.  There is lipomatous hypertrophy of interatrial septum with significant fatty deposition.  There is hypodense mixing artifact in right atrium and right ventricle.  Please see radiology report for non cardiac findings.  IMPRESSION: 1. Coronary calcium  score of 131. This was 72 percentile for age and sex matched control.  2. Normal coronary origin with right dominance.  3. Normal RCA, scattered LAD calcified plaque up to 74% at bifurcation of mid diagonal (sending for FFR) and non flow limiting proximal circumflex stenosis (0-24%).  4.  Aortic atherosclerosis.  5. Lipomatous hypertrophy of interatrial septum. Given  prominent nature of fatty deposition, recommend cardiac MRI for further clarification.  Oneil Parchment, MD [facility]   Electronically Signed By: Oneil Parchment MD On: 05/14/2020 16:44  Narrative EXAM: OVER-READ INTERPRETATION  CT CHEST  The following report is an over-read performed by radiologist Dr. Rockey Kilts of [facility] on 05/14/2020. This over-read does not include interpretation of cardiac or coronary anatomy or pathology. The coronary CTA interpretation by the cardiologist is attached.  COMPARISON:  04/01/2018 chest radiograph in 03/26/2018 chest CT.  FINDINGS: Vascular: Aortic atherosclerosis. No dissection. No central pulmonary embolism, on this non-dedicated study.  Mediastinum/Nodes: No imaged thoracic adenopathy.  Lungs/Pleura: No pleural fluid.  Moderate centrilobular emphysema.  Upper Abdomen: Hepatic steatosis.  Musculoskeletal: Right mastectomy.  No acute osseous abnormality.  IMPRESSION: 1.  No acute findings in the imaged extracardiac chest. 2.  Aortic Atherosclerosis (ICD10-I70.0). 3. Hepatic steatosis.  Electronically Signed: By: Rockey Kilts M.D. On: 05/14/2020 15:50   CARDIAC MRI  MR CARDIAC MORPHOLOGY W WO CONTRAST 02/08/2023  Narrative CLINICAL DATA:  Ventricular tachycardia (VT), sustained; Congenital heart disease, known or suspected  COMPARISON: Echocardiogram 01/24/23  EXAM: MR CARDIA MORPHOLOGY WITHOUT AND WITH CONTRAST; MR CARDIAC VELOCITY FLOW MAPPING  TECHNIQUE: The patient was scanned on a 1.5 Tesla GE magnet. A dedicated cardiac coil was used. Functional imaging was done using Fiest[employer] sequences. 2,3, and 4 chamber views were done to assess for RWMA's. Modified Simpson's rule using a short axis stack was used to calculate an ejection fraction on a dedicated work [employer]. The patient received 9mL GADAVIST  GADOBUTROL  1 MMOL/ML IV SOLN. After 10 minutes inversion recovery sequences were used to  assess for infiltration and scar tissue. Phase contrast velocity encoded images obtained x 2.  This examination is tailored for evaluation cardiac anatomy and function and provides very limited assessment of noncardiac structures, which are  accordingly not evaluated during interpretation. If there is clinical concern for extracardiac pathology, further evaluation with CT imaging should be considered.  FINDINGS: Study quality: Average  LEFT VENTRICLE:  Normal left ventricular chamber size.  Moderate LVH, 14 mm basal hypertrophy.  Mildly reduced left ventricular systolic function.  LVEF = 41%, visually appears 40-45%.  Abnormal septal motion due to conduction delay and global hypokinesis.  No myocardial edema, T2 = 48 msec  Normal first pass perfusion.  There is no post contrast delayed myocardial enhancement.  Normal T1 myocardial nulling kinetics suggest against a diagnosis of cardiac amyloidosis.  ECV = 27%  RIGHT VENTRICLE:  Normal right ventricular chamber size.  Normal right ventricular wall thickness.  Normal right ventricular systolic function.  RVEF not able to be accurately calculated.  There are no regional wall motion abnormalities.  No post contrast delayed myocardial enhancement.  ATRIA:  Normal left atrial size.  Normal right atrial size.  Prominent lipomatous hypertrophy of the atrial septum. T2 haste demonstrates t2 signal similar to epicardial fat. No delayed enhancement of the atrial septal region.  VALVES:  No significant valvular abnormalities.  Tricuspid aortic valve.  No significant valvular heart disease by quantitation.  Values are approximate given technical quality of scan.  Likely no more than mild MR, RF 15%.  Trivial AI and PI.  PERICARDIUM:  Normal pericardium.  Trivial pericardial effusion.  Prominent epicardial fat layer.  OTHER: No significant extracardiac findings.  MEASUREMENTS: Due to technical  limitations, measurements are approximate values.  Qp/Qs: 1.07  Left ventricle:  LV Female  LV EF: 41% (normal 56-78%)  Absolute volumes:  LV EDV: 82mL (Normal 52-141 mL)  LV ESV: 49mL (normal 13-51 mL)  LV SV: 33mL (Normal 33-97 mL)  CO: 2.5L/min (Normal 2.7-6.0 L/min)  Indexed volumes:  LV EDV: 42mL/sq-m (normal 41-81 mL/sq-m)  LV ESV: 46mL/sq-m (Normal 12-21 mL/sq-m)  LV SV: 39mL/sq-m (Normal 26-56 mL/sq-m)  CI: 1.31L/min/sq-m (Normal 1.8-3.8 L/min/sq-m)  Right ventricle:  RV female  RV measurements appear inaccurate and are not reported.  IMPRESSION: 1. Mildly reduced LV systolic function with dyssynchronous septal motion due to conduction delay. Normal LV chamber size. EF 41%, visually 40-45%.  2.  No delayed myocardial enhancement or myocardial edema.  3.  Visually normal right ventricular chamber size and function.   Electronically Signed By: Soyla Merck M.D. On: 02/09/2023 13:41   ______________________________________________________________________________________________      Risk Assessment/Calculations   HYPERTENSION CONTROL Vitals:   10/20/24 1053 10/20/24 1229  BP: (!) 140/80 (!) 140/80    The patient's blood pressure is elevated above target today.  In order to address the patient's elevated BP: Labs and/or other diagnostics are currently pending prior to making blood pressure medication adjustments.; A new medication was prescribed today.          Physical Exam VS:  BP (!) 140/80   Pulse 61   Ht 5' (1.524 m)   Wt 189 lb 12.8 oz (86.1 kg)   SpO2 92%   BMI 37.07 kg/m        Wt Readings from Last 3 Encounters:  10/20/24 189 lb 12.8 oz (86.1 kg)  10/16/24 171 lb 4.8 oz (77.7 kg)  06/19/23 171 lb 3.2 oz (77.7 kg)    GEN: Well nourished, well developed in no acute distress NECK: No JVD; No carotid bruits CARDIAC: RRR, no murmurs, rubs, gallops RESPIRATORY:  Clear to auscultation without rales, wheezing or rhonchi   ABDOMEN: Soft, non-tender, non-distended EXTREMITIES:  No edema;  No deformity   ASSESSMENT AND PLAN CAD -calcium  score in 2021 of 131, 72nd percentile, FFR negative >> heart cath in 2024 revealed normal coronary arteries. Stable with no anginal symptoms. No indication for ischemic evaluation.  Heart healthy diet and regular cardiovascular exercise encouraged.    HFmrEF -NYHA class II for shortness of breath but I think this is related to her COPD, she is euvolemic.  Currently taking Lasix  most days of the week >> will change this as needed for pedal edema/abdominal tightness or worsening shortness of breath.  Will restart spironolactone  25 mg daily, lab work a few days ago revealed creatinine 1.37 potassium 3.5 >> repeat BMET in 3 days.  We discussed adding SGLT2 inhibitor however she frequently has stress incontinence and this might not be a good idea for her.  Previously on metoprolol  but this was discontinued secondary to bradycardia, will not restart at this time as her heart rate is 61 bpm.  Hypertension-blood pressure is elevated 140/80, some confusion on how she is currently taking her medication, will start her back on spironolactone  25 mg daily, if this does not control her blood pressure then consider adding valsartan.  Torsades de pointes -this in the setting of nonadherence with her thyroid  medication.  Dyslipidemia-formally monitored by her PCP, most recent LDL was 71 on 05/28/2024, continue Crestor  20 mg daily.  COPD - breathing appears to be at baseline, follows with Dr. Geronimo.         Dispo: Restart spironolactone  25 mg daily, repeat BMET in 3 days.  Follow-up in  Signed, Delon JAYSON Hoover, NP

## 2024-10-16 ENCOUNTER — Telehealth: Payer: Self-pay | Admitting: Cardiology

## 2024-10-16 ENCOUNTER — Emergency Department (HOSPITAL_BASED_OUTPATIENT_CLINIC_OR_DEPARTMENT_OTHER): Admitting: Radiology

## 2024-10-16 ENCOUNTER — Other Ambulatory Visit: Payer: Self-pay

## 2024-10-16 ENCOUNTER — Encounter (HOSPITAL_BASED_OUTPATIENT_CLINIC_OR_DEPARTMENT_OTHER): Payer: Self-pay | Admitting: Emergency Medicine

## 2024-10-16 ENCOUNTER — Emergency Department (HOSPITAL_BASED_OUTPATIENT_CLINIC_OR_DEPARTMENT_OTHER)
Admission: EM | Admit: 2024-10-16 | Discharge: 2024-10-16 | Disposition: A | Discharge status: Home / Self Care | Attending: Emergency Medicine | Admitting: Emergency Medicine

## 2024-10-16 ENCOUNTER — Emergency Department (HOSPITAL_BASED_OUTPATIENT_CLINIC_OR_DEPARTMENT_OTHER)

## 2024-10-16 DIAGNOSIS — Z853 Personal history of malignant neoplasm of breast: Secondary | ICD-10-CM | POA: Diagnosis not present

## 2024-10-16 DIAGNOSIS — J449 Chronic obstructive pulmonary disease, unspecified: Secondary | ICD-10-CM | POA: Insufficient documentation

## 2024-10-16 DIAGNOSIS — R079 Chest pain, unspecified: Secondary | ICD-10-CM | POA: Diagnosis not present

## 2024-10-16 DIAGNOSIS — I509 Heart failure, unspecified: Secondary | ICD-10-CM | POA: Insufficient documentation

## 2024-10-16 DIAGNOSIS — I10 Essential (primary) hypertension: Secondary | ICD-10-CM

## 2024-10-16 DIAGNOSIS — N189 Chronic kidney disease, unspecified: Secondary | ICD-10-CM | POA: Insufficient documentation

## 2024-10-16 DIAGNOSIS — R519 Headache, unspecified: Secondary | ICD-10-CM | POA: Diagnosis present

## 2024-10-16 DIAGNOSIS — R0602 Shortness of breath: Secondary | ICD-10-CM | POA: Insufficient documentation

## 2024-10-16 DIAGNOSIS — I130 Hypertensive heart and chronic kidney disease with heart failure and stage 1 through stage 4 chronic kidney disease, or unspecified chronic kidney disease: Secondary | ICD-10-CM | POA: Diagnosis not present

## 2024-10-16 LAB — BASIC METABOLIC PANEL WITH GFR
Anion gap: 13 (ref 5–15)
BUN: 17 mg/dL (ref 8–23)
CO2: 27 mmol/L (ref 22–32)
Calcium: 10.1 mg/dL (ref 8.9–10.3)
Chloride: 100 mmol/L (ref 98–111)
Creatinine, Ser: 1.37 mg/dL — ABNORMAL HIGH (ref 0.44–1.00)
GFR, Estimated: 40 mL/min — ABNORMAL LOW (ref >60)
Glucose, Bld: 98 mg/dL (ref 70–99)
Potassium: 3.5 mmol/L (ref 3.5–5.1)
Sodium: 139 mmol/L (ref 135–145)

## 2024-10-16 LAB — TROPONIN T, HIGH SENSITIVITY
Troponin T High Sensitivity: 23 ng/L — ABNORMAL HIGH (ref 0–19)
Troponin T High Sensitivity: 18 ng/L (ref 0–19)

## 2024-10-16 LAB — CBC
HCT: 40 % (ref 36.0–46.0)
Hemoglobin: 13.1 g/dL (ref 12.0–15.0)
MCH: 30.4 pg (ref 26.0–34.0)
MCHC: 32.8 g/dL (ref 30.0–36.0)
MCV: 92.8 fL (ref 80.0–100.0)
Platelets: 302 K/uL (ref 150–400)
RBC: 4.31 MIL/uL (ref 3.87–5.11)
RDW: 12.6 % (ref 11.5–15.5)
WBC: 9.3 K/uL (ref 4.0–10.5)
nRBC: 0 % (ref 0.0–0.2)

## 2024-10-16 LAB — PRO BRAIN NATRIURETIC PEPTIDE: Pro Brain Natriuretic Peptide: 847 pg/mL — ABNORMAL HIGH (ref <300.0)

## 2024-10-16 NOTE — Telephone Encounter (Signed)
 Pt c/o Shortness Of Breath: STAT if SOB developed within the last 24 hours or pt is noticeably SOB on the phone  1. Are you currently SOB (can you hear that pt is SOB on the phone)? No  2. How long have you been experiencing SOB? A week   3. Are you SOB when sitting or when up moving around? Both   4. Are you currently experiencing any other symptoms? Headache and fatigue

## 2024-10-16 NOTE — ED Notes (Signed)
 Reviewed AVS/discharge instructions with patient. Time allotted for and all questions answered. Patient is agreeable for d/c and escorted to ED exit by staff.

## 2024-10-16 NOTE — ED Triage Notes (Signed)
 Pt via pov from home with chest pain and hypertension x 1 week; headache as well. Pt endorses nausea and sob x 3 days. Pt a&o x 4; nad noted.    Pt reports bp 218/90 at home and her husband told her that her respirations were in the 60's.    Pt a&o x 4; nad noted.

## 2024-10-16 NOTE — Telephone Encounter (Signed)
See other telephone note from today 

## 2024-10-16 NOTE — Discharge Instructions (Addendum)
Follow-up with your cardiologist.  Return if symptoms worsen. 

## 2024-10-16 NOTE — Telephone Encounter (Signed)
 Patient identification verified by 2 forms.   Called and spoke to patient  Patient states:  -High blood pressure for about 1 week, more consistent last 3 days.  -167/90s  -SOB -Dizziness -Headache -Pt spoke with PCP earlier, was instructed to go to the ED.  -Pt encouraged to go to the ED since she was already instructed to go.     Interventions/Plan: -She will go to be evaluated.   Reviewed ED warning signs/precautions  Patient agrees with plan, no questions at this time

## 2024-10-16 NOTE — ED Provider Notes (Signed)
 [place] EMERGENCY DEPARTMENT AT [facility] Provider Note   CSN: [id number] Arrival date & time: 10/16/24  1604     Patient presents with: Chest Pain and Hypertension   Kimberly Waters is a 78 y.o. female.    Patient with history of chronic kidney disease, COPD, CHF, history of breast cancer, hypertension, hyperlipidemia, left bundle branch block, Torsades --presents to the emergency department for evaluation of fatigue, increasing shortness of breath, some intermittent chest discomfort, headache, elevated blood pressures.  Patient was camping about a week ago and felt generally fatigued.  She noted that her blood pressure had been elevated.  Patient's daughter at bedside questions medication compliance.  Patient admits to not taking her fluid pill regularly during this time.  In general she was feeling relatively okay.  She has had elevated blood pressures over the past week, into the 200s today.  She spoke with her primary care doctor and cardiology office and they both recommended that she come to the emergency department.  She was concerned that her blood pressure was high enough to have a stroke but she denies any strokelike symptoms including weakness, difficulty walking, talking, etc.  No significant lower extremity swelling.  Patient does not do a lot of physical activity, walks with a walker now.  She does not drive.  She did take her fluid pill yesterday and today and she had appropriate urination afterwards.  No fevers or urinary symptoms.  Headache is generalized.       Prior to Admission medications   Medication Sig Start Date End Date Taking? Authorizing Provider  acetaminophen  (TYLENOL ) 325 MG tablet Take 2 tablets (650 mg total) by mouth every 4 (four) hours as needed for headache or mild pain. 02/09/23   Lesia Ozell Barter, PA-C  albuterol  (PROVENTIL ,VENTOLIN ) 90 MCG/ACT inhaler Inhale 2 puffs into the lungs every 4 (four) hours as needed. Patient taking  differently: Inhale 2 puffs into the lungs every 4 (four) hours as needed for wheezing or shortness of breath. 04/04/11   Claudene Arthea HERO, DO  amLODipine  (NORVASC ) 5 MG tablet Take 1 tablet (5 mg total) by mouth daily. Patient not taking: Reported on 09/23/2024 03/14/23   Fernande Elspeth BROCKS, MD  arformoterol  (BROVANA ) 15 MCG/2ML NEBU Take 2 mLs (15 mcg total) by nebulization 2 (two) times daily. 07/27/21   Parrett, Madelin RAMAN, NP  betamethasone  dipropionate 0.05 % cream 1 application    [provider]  buPROPion  (WELLBUTRIN  SR) 150 MG 12 hr tablet Take 150 mg by mouth daily.  07/09/19   [provider]  clobetasol  ointment (TEMOVATE ) 0.05 % as needed. 07/23/20   [provider]  escitalopram  (LEXAPRO ) 10 MG tablet Take 1 tablet (10 mg total) by mouth daily. 04/04/18   Vann, Jessica U, DO  ezetimibe  (ZETIA ) 10 MG tablet Take 1 tablet (10 mg total) by mouth daily. 03/08/22   Jeffrie Oneil BROCKS, MD  furosemide  (LASIX ) 40 MG tablet Take 1 tablet (40 mg total) by mouth daily. 04/04/18   Vann, Jessica U, DO  ipratropium (ATROVENT ) 0.06 % nasal spray Place 2 sprays into the nose 4 (four) times daily. 09/17/18   Geronimo Amel, MD  ketoconazole  (NIZORAL ) 2 % cream Apply 1 application topically daily. 07/21/21   Magrinat, Gustav C, MD  levocetirizine (XYZAL ) 5 MG tablet Take 5 mg by mouth as needed for allergies.    [provider]  levothyroxine  (SYNTHROID ) 112 MCG tablet Take 112 mcg by mouth daily before breakfast. 05/28/23  [provider]  melatonin 5 MG TABS Take 1 tablet (5 mg total) by mouth at bedtime. 02/09/23   Lesia Ozell Barter, PA-C  mometasone  (ELOCON ) 0.1 % ointment Apply topically as needed. 08/04/20   [provider]  nitroGLYCERIN  (NITROSTAT ) 0.4 MG SL tablet Place 0.4 mg under the tongue every 5 (five) minutes as needed for chest pain. 10/22/20   [provider]  omeprazole  (PRILOSEC) 20 MG capsule Take 20 mg by mouth daily.    [provider]  potassium chloride  SA (KLOR-CON  M) 20 MEQ tablet TAKE 2 TABLETS BY MOUTH DAILY 04/28/24   Fernande Elspeth BROCKS, MD  revefenacin  (YUPELRI ) 175 MCG/3ML nebulizer solution Take 3 mLs (175 mcg total) by nebulization daily. 07/27/21   Parrett, Madelin RAMAN, NP  rosuvastatin  (CRESTOR ) 20 MG tablet Take 1 tablet (20 mg total) by mouth daily. 03/08/22   Jeffrie Oneil BROCKS, MD  spironolactone  (ALDACTONE ) 25 MG tablet Take 1 tablet (25 mg total) by mouth daily. 09/23/24   Carlin Delon BROCKS, NP  tacrolimus (PROTOPIC) 0.1 % ointment Apply topically as needed. 08/04/20   [provider]    Allergies: Latex, Adhesive [tape], Codeine , and Other    Review of Systems  Updated Vital Signs BP (!) 165/93 (BP Location: Left Arm)   Pulse 76   Temp 98.2 F (36.8 C)   Resp 18   Ht 5' (1.524 m)   Wt 77.7 kg   SpO2 96%   BMI 33.45 kg/m   Physical Exam Vitals and nursing note reviewed.  Constitutional:      Appearance: She is well-developed. She is not diaphoretic.  HENT:     Head: Normocephalic and atraumatic.     Mouth/Throat:     Mouth: Mucous membranes are not dry.  Eyes:     Conjunctiva/sclera: Conjunctivae normal.  Neck:     Vascular: Normal carotid pulses.     Trachea: Trachea normal. No tracheal deviation.  Cardiovascular:     Rate and Rhythm: Normal rate and regular rhythm.     Pulses: No decreased pulses.          Radial pulses are 2+ on the right side and 2+ on the left side.     Heart sounds: Normal heart sounds, S1 normal and S2 normal. No murmur heard. Pulmonary:     Effort: Pulmonary effort is normal. No respiratory distress.     Breath sounds: No wheezing, rhonchi or rales.  Chest:     Chest wall: No tenderness.  Abdominal:     General: Bowel sounds are normal.     Palpations: Abdomen is soft.     Tenderness: There is no abdominal tenderness. There is no guarding or rebound.  Musculoskeletal:        General: Normal range of motion.     Cervical back: Normal range of  motion and neck supple. No muscular tenderness.     Right lower leg: No edema.     Left lower leg: No edema.  Skin:    General: Skin is warm and dry.     Coloration: Skin is not pale.  Neurological:     General: No focal deficit present.     Mental Status: She is alert and oriented to person, place, and time.     Cranial Nerves: No cranial nerve deficit.     Motor: No weakness.     (all labs ordered are listed, but only abnormal results are displayed) Labs Reviewed  BASIC METABOLIC PANEL WITH GFR -  Abnormal; Notable for the following components:      Result Value   Creatinine, Ser 1.37 (*)    GFR, Estimated 40 (*)    All other components within normal limits  TROPONIN T, HIGH SENSITIVITY - Abnormal; Notable for the following components:   Troponin T High Sensitivity 23 (*)    All other components within normal limits  CBC  PRO BRAIN NATRIURETIC PEPTIDE  TROPONIN T, HIGH SENSITIVITY    EKG: EKG Interpretation Date/Time:  Thursday October 16 2024 16:19:07 EDT Ventricular Rate:  69 PR Interval:  142 QRS Duration:  116 QT Interval:  456 QTC Calculation: 488 R Axis:   -7  Text Interpretation: Normal sinus rhythm with sinus arrhythmia Minimal voltage criteria for LVH, may be normal variant ( Cornell product ) Confirmed by Ruthe Cornet [phone]) on 10/16/2024 4:21:14 PM  Radiology: CT Head Wo Contrast Result Date: 10/16/2024 CLINICAL DATA:  Worsening headache 1 week. Nausea, chest pain and shortness of breath 3 days. EXAM: CT HEAD WITHOUT CONTRAST TECHNIQUE: Contiguous axial images were obtained from the base of the skull through the vertex without intravenous contrast. RADIATION DOSE REDUCTION: This exam was performed according to the departmental dose-optimization program which includes automated exposure control, adjustment of the mA and/or kV according to patient size and/or use of iterative reconstruction technique. COMPARISON:  02/01/2023 FINDINGS: Brain: Ventricles,  cisterns and other CSF spaces are normal. There is no mass, mass effect, shift of midline structures or acute hemorrhage. No evidence of acute infarction. Mild chronic ischemic microvascular disease is present. Vascular: No hyperdense vessel or unexpected calcification. Skull: Normal. Negative for fracture or focal lesion. Sinuses/Orbits: No acute finding. Other: None. IMPRESSION: 1. No acute findings. 2. Mild chronic ischemic microvascular disease. Electronically Signed   By: Toribio Agreste M.D.   On: 10/16/2024 18:31     Procedures   Medications Ordered in the ED - No data to display  ED Course  Patient seen and examined. History obtained directly from patient.   Labs/EKG: Personally reviewed and interpreted CBC which was unremarkable; BMP with creatinine of 1.37 which is consistent with baseline.  Troponin mildly elevated at 23.  Awaiting BNP and second troponin.  Imaging: Ordered CT head agree negative.  Chest x-ray pending.  Medications/Fluids: None ordered  Most recent vital signs reviewed and are as follows: BP (!) 165/93 (BP Location: Left Arm)   Pulse 76   Temp 98.2 F (36.8 C)   Resp 18   Ht 5' (1.524 m)   Wt 77.7 kg   SpO2 96%   BMI 33.45 kg/m   Initial impression: Elevated blood pressure, evaluate for heart failure exacerbation. Check pulse ox with ambulation.   6:56 PM Signout to Dr. Ruthe at shift change pending 2nd trop, BNP, ambulation with pulse ox.   Agree that CXR appears clear, agree head CT appears normal.                                    Medical Decision Making Amount and/or Complexity of Data Reviewed Labs: ordered. Radiology: ordered.   Patient here today with elevated blood pressures, worsening shortness of breath, some chest discomfort in setting of recent poor medicine compliance.  She has been back on her diuretic for past couple days.  Currently awaiting completion of workup for heart failure.  Chest x-ray appears clear.  Blood pressure  elevated here but no evidence of stroke.  Final diagnoses:  Primary hypertension  Shortness of breath    ED Discharge Orders     None          Desiderio Chew, PA-C 10/16/24 1859    Ruthe Cornet, DO 10/16/24 1920

## 2024-10-16 NOTE — Telephone Encounter (Signed)
 Pt c/o BP issue: STAT if pt c/o blurred vision, one-sided weakness or slurred speech.  STAT if BP is GREATER than 180/120 TODAY.  STAT if BP is LESS than 90/60 and SYMPTOMATIC TODAY  1. What is your BP concern? Hypertension   2. Have you taken any BP medication today? Yes   3. What are your last 5 BP readings? 218/90  4. Are you having any other symptoms (ex. Dizziness, headache, blurred vision, passed out)? Headache and SOB   Please advise. Call transferred.

## 2024-10-20 ENCOUNTER — Encounter: Payer: Self-pay | Admitting: Cardiology

## 2024-10-20 ENCOUNTER — Ambulatory Visit: Attending: Cardiology | Admitting: Cardiology

## 2024-10-20 ENCOUNTER — Other Ambulatory Visit: Payer: Self-pay

## 2024-10-20 VITALS — BP 140/80 | HR 61 | Ht 60.0 in | Wt 189.8 lb

## 2024-10-20 DIAGNOSIS — J449 Chronic obstructive pulmonary disease, unspecified: Secondary | ICD-10-CM | POA: Diagnosis not present

## 2024-10-20 DIAGNOSIS — I5022 Chronic systolic (congestive) heart failure: Secondary | ICD-10-CM | POA: Diagnosis not present

## 2024-10-20 DIAGNOSIS — I4721 Torsades de pointes: Secondary | ICD-10-CM | POA: Diagnosis not present

## 2024-10-20 DIAGNOSIS — I2510 Atherosclerotic heart disease of native coronary artery without angina pectoris: Secondary | ICD-10-CM | POA: Diagnosis not present

## 2024-10-20 DIAGNOSIS — E782 Mixed hyperlipidemia: Secondary | ICD-10-CM

## 2024-10-20 DIAGNOSIS — I10 Essential (primary) hypertension: Secondary | ICD-10-CM

## 2024-10-20 MED ORDER — SPIRONOLACTONE 25 MG PO TABS: 25.0000 mg | ORAL_TABLET | Freq: Every day | ORAL | 1 refills | Status: DC

## 2024-10-20 MED ORDER — FUROSEMIDE 40 MG PO TABS: 40.0000 mg | ORAL_TABLET | Freq: Every day | ORAL | Status: DC | PRN

## 2024-10-20 NOTE — Patient Instructions (Signed)
 Medication Instructions:  CHANGE Lasix  to as needed *If you need a refill on your cardiac medications before your next appointment, please call your pharmacy*  Lab Work: TODAY-BMET IN 3 DAYS  If you have labs (blood work) drawn today and your tests are completely normal, you will receive your results only by: MyChart Message (if you have MyChart) OR A paper copy in the mail If you have any lab test that is abnormal or we need to change your treatment, we will call you to review the results.  Testing/Procedures: NONE ORDERED  Follow-Up: At [facility], you and your health needs are our priority.  As part of our continuing mission to provide you with exceptional heart care, our providers are all part of one team.  This team includes your primary Cardiologist (physician) and Advanced Practice Providers or APPs (Physician Assistants and Nurse Practitioners) who all work together to provide you with the care you need, when you need it.  Your next appointment:   6 month(s)  Provider:   Oneil Parchment, MD or delon Hoover, NP   We recommend signing up for the patient portal called MyChart.  Sign up information is provided on this After Visit Summary.  MyChart is used to connect with patients for Virtual Visits (Telemedicine).  Patients are able to view lab/test results, encounter notes, upcoming appointments, etc.  Non-urgent messages can be sent to your provider as well.   To learn more about what you can do with MyChart, go to [email or web address].   Other Instructions

## 2024-10-24 DIAGNOSIS — I700 Atherosclerosis of aorta: Secondary | ICD-10-CM | POA: Diagnosis not present

## 2024-10-24 DIAGNOSIS — I4721 Torsades de pointes: Secondary | ICD-10-CM | POA: Diagnosis not present

## 2024-10-24 DIAGNOSIS — J439 Emphysema, unspecified: Secondary | ICD-10-CM | POA: Diagnosis not present

## 2024-10-24 DIAGNOSIS — I10 Essential (primary) hypertension: Secondary | ICD-10-CM | POA: Diagnosis not present

## 2024-10-24 DIAGNOSIS — I5022 Chronic systolic (congestive) heart failure: Secondary | ICD-10-CM | POA: Diagnosis not present

## 2024-10-24 DIAGNOSIS — Z853 Personal history of malignant neoplasm of breast: Secondary | ICD-10-CM | POA: Diagnosis not present

## 2024-10-24 DIAGNOSIS — I2510 Atherosclerotic heart disease of native coronary artery without angina pectoris: Secondary | ICD-10-CM | POA: Diagnosis not present

## 2024-10-24 DIAGNOSIS — N1832 Chronic kidney disease, stage 3b: Secondary | ICD-10-CM | POA: Diagnosis not present

## 2024-10-25 LAB — BASIC METABOLIC PANEL WITH GFR
BUN/Creatinine Ratio: 14 (ref 12–28)
BUN: 15 mg/dL (ref 8–27)
CO2: 24 mmol/L (ref 20–29)
Calcium: 9.4 mg/dL (ref 8.7–10.3)
Chloride: 102 mmol/L (ref 96–106)
Creatinine, Ser: 1.09 mg/dL — ABNORMAL HIGH (ref 0.57–1.00)
Glucose: 112 mg/dL — ABNORMAL HIGH (ref 70–99)
Potassium: 4 mmol/L (ref 3.5–5.2)
Sodium: 143 mmol/L (ref 134–144)
eGFR: 53 mL/min/1.73 — ABNORMAL LOW

## 2024-10-27 ENCOUNTER — Ambulatory Visit: Payer: Self-pay | Admitting: Cardiology

## 2024-10-27 DIAGNOSIS — Z79899 Other long term (current) drug therapy: Secondary | ICD-10-CM

## 2024-10-28 NOTE — Progress Notes (Signed)
 [facility] - Clinic Note  10/31/2024     CHIEF COMPLAINT Patient presents for Retina Follow Up  HISTORY OF PRESENT ILLNESS: Kimberly Waters is a 78 y.o. female who presents to the clinic today for:  HPI     Retina Follow Up   Patient presents with  Wet AMD.  In both eyes.  This started 4 weeks ago.  Duration of 4 weeks.  Since onset it is stable.  I, the attending physician,  performed the HPI with the patient and updated documentation appropriately.        Comments   4 week retina follow up ARMD IVA OD pt is reporting no vision changes noticed she denies any flashes or floaters       Last edited by Valdemar Rogue, MD on 10/31/2024  2:58 PM.      Pt states she's having issues w/ her BP.   Referring physician: Dwight Trula SQUIBB, MD [address] [place],  [place] [address]  HISTORICAL INFORMATION:   Selected notes from the MEDICAL RECORD NUMBER Referred by Dr. Ladora for eval of decreased VA OD.   CURRENT MEDICATIONS: No current outpatient medications on file. (Ophthalmic Drugs)   No current facility-administered medications for this visit. (Ophthalmic Drugs)   Current Outpatient Medications (Other)  Medication Sig   acetaminophen  (TYLENOL ) 325 MG tablet Take 2 tablets (650 mg total) by mouth every 4 (four) hours as needed for headache or mild pain.   albuterol  (PROVENTIL ,VENTOLIN ) 90 MCG/ACT inhaler Inhale 2 puffs into the lungs every 4 (four) hours as needed. (Patient taking differently: Inhale 2 puffs into the lungs every 4 (four) hours as needed for wheezing or shortness of breath.)   arformoterol  (BROVANA ) 15 MCG/2ML NEBU Take 2 mLs (15 mcg total) by nebulization 2 (two) times daily.   betamethasone  dipropionate 0.05 % cream 1 application   buPROPion  (WELLBUTRIN  SR) 150 MG 12 hr tablet Take 150 mg by mouth daily.    clobetasol  ointment (TEMOVATE ) 0.05 % as needed.   escitalopram  (LEXAPRO ) 10 MG tablet Take 1 tablet (10 mg total) by mouth  daily.   ezetimibe  (ZETIA ) 10 MG tablet Take 1 tablet (10 mg total) by mouth daily.   furosemide  (LASIX ) 40 MG tablet Take 1 tablet (40 mg total) by mouth daily as needed.   ipratropium (ATROVENT ) 0.06 % nasal spray Place 2 sprays into the nose 4 (four) times daily.   ketoconazole  (NIZORAL ) 2 % cream Apply 1 application topically daily.   levocetirizine (XYZAL ) 5 MG tablet Take 5 mg by mouth as needed for allergies.   levothyroxine  (SYNTHROID ) 112 MCG tablet Take 112 mcg by mouth daily before breakfast.   mometasone  (ELOCON ) 0.1 % ointment Apply topically as needed.   nitroGLYCERIN  (NITROSTAT ) 0.4 MG SL tablet Place 0.4 mg under the tongue every 5 (five) minutes as needed for chest pain.   omeprazole  (PRILOSEC) 20 MG capsule Take 20 mg by mouth daily.   revefenacin  (YUPELRI ) 175 MCG/3ML nebulizer solution Take 3 mLs (175 mcg total) by nebulization daily.   rosuvastatin  (CRESTOR ) 20 MG tablet Take 1 tablet (20 mg total) by mouth daily.   spironolactone  (ALDACTONE ) 25 MG tablet Take 1 tablet (25 mg total) by mouth daily.   tacrolimus (PROTOPIC) 0.1 % ointment Apply topically as needed.   melatonin 5 MG TABS Take 1 tablet (5 mg total) by mouth at bedtime.   potassium chloride  SA (KLOR-CON  M) 20 MEQ tablet TAKE 2 TABLETS BY MOUTH  DAILY (Patient not taking: Reported on 10/20/2024)   No current facility-administered medications for this visit. (Other)   REVIEW OF SYSTEMS: ROS   Positive for: Neurological, Eyes, Respiratory Negative for: Constitutional, Gastrointestinal, Skin, Genitourinary, Musculoskeletal, HENT, Endocrine, Cardiovascular, Psychiatric, Allergic/Imm, Heme/Lymph Last edited by Resa Delon ORN, COT on 10/31/2024 12:33 PM.      ALLERGIES Allergies  Allergen Reactions   Latex Other (See Comments)    Other reaction(s): Other (See Comments) Tears skin.   Amlodipine  Other (See Comments)    Peripheral neuropathy   Adhesive [Tape] Other (See Comments)    (skin tears)   Tolerates PAPER TAPE   Codeine  Nausea Only   Other Nausea Only    Anesthesia--nausea    PAST MEDICAL HISTORY Past Medical History:  Diagnosis Date   Allergy    Breast cancer (HCC)    Colon polyps    Depression    Dyspnea    SEASONAL   Family history of breast cancer    Family history of colon cancer    GERD (gastroesophageal reflux disease)    History of radiation therapy 06/05/17-07/20/17   right chest wall 50.4 Gy in 28 fractions, right axillary nodal region 45 Gy in 25 fractions   Hyperlipidemia    Hypertension    Hypertensive retinopathy    Hypothyroidism    Macular degeneration    Peripheral neuropathy 06/18/2019   PONV (postoperative nausea and vomiting)    Past Surgical History:  Procedure Laterality Date   ABDOMINAL HYSTERECTOMY     CATARACT EXTRACTION, BILATERAL Bilateral    Dr. [facility]   KNEE SURGERY Right    LEFT HEART CATH AND CORONARY ANGIOGRAPHY N/A 02/02/2023   Procedure: LEFT HEART CATH AND CORONARY ANGIOGRAPHY;  Surgeon: Burnard Debby LABOR, MD;  Location: [facility] INVASIVE CV LAB;  Service: Cardiovascular;  Laterality: N/A;   MASTECTOMY W/ SENTINEL NODE BIOPSY Right 03/05/2017   Procedure: BILATERAL TOTAL MASTECTOMIES WITH RIGHT SENTINEL LYMPH NODE BIOPSIES;  Surgeon: Morene Olives, MD;  Location: [facility] OR;  Service: General;  Laterality: Right;   SHOULDER SURGERY     BILAT   FAMILY HISTORY Family History  Problem Relation Age of Onset   Heart disease Mother 60       stents   Colon cancer Maternal Grandfather 59   Colon cancer Maternal Aunt 65   Colon cancer Maternal Uncle        dx in his 53s   Breast cancer Paternal Aunt 62   Breast cancer Cousin 43       paternal first cousin   Lung cancer Paternal Grandfather    Colon cancer Maternal Uncle    Head & neck cancer Maternal Uncle        oral cancer   SOCIAL HISTORY Social History   Tobacco Use   Smoking status: Former    Current packs/day: 0.00    Average packs/day: 1  pack/day for 45.0 years (45.0 ttl pk-yrs)    Types: Cigarettes    Start date: 02/23/1972    Quit date: 02/22/2017    Years since quitting: 7.7   Smokeless tobacco: Never  Vaping Use   Vaping status: Never Used  Substance Use Topics   Alcohol use: No    Alcohol/week: 0.0 standard drinks of alcohol   Drug use: No       OPHTHALMIC EXAM: Base Eye Exam     Visual Acuity (Snellen - Linear)       Right Left   Dist cc 20/50 -2 CF  at 3'   Dist ph cc NI NI         Tonometry (Tonopen, 12:36 PM)       Right Left   Pressure 15 17         Pupils       Pupils Dark Light Shape React APD   Right PERRL 3 2 Round Brisk None   Left PERRL 3 2 Round Brisk None         Visual Fields       Left Right    Full Full         Extraocular Movement       Right Left    Full, Ortho Full, Ortho         Neuro/Psych     Oriented x3: Yes   Mood/Affect: Normal         Dilation     Both eyes: 2.5% Phenylephrine  @ 12:36 PM           Slit Lamp and Fundus Exam     Slit Lamp Exam       Right Left   Lids/Lashes Dermatochalasis - upper lid Dermatochalasis - upper lid, mild MGD   Conjunctiva/Sclera nasal and temporal pinguecula nasal and temporal pinguecula   Cornea arcus, well healed cataract wound, 2-3+ Punctate epithelial erosions Trace Punctate epithelial erosions, arcus, well healed cataract wound   Anterior Chamber deep, narrow temporal angle, 0.5+ fine cell and pigment deep, narrow temporal angle   Iris Round and moderately dilated, mild anterior bowing Round and moderately dilated, mild anterior bowing   Lens PC IOL in good position PC IOL in good position   Anterior Vitreous mild syneresis, Posterior vitreous detachment mild syneresis, Posterior vitreous detachment         Fundus Exam       Right Left   Disc Pink and Sharp, Compact, mild PPA Mild Pallor, Sharp rim, +elevation   C/D Ratio 0.0 0.1   Macula Blunted foveal reflex, +CNV with pigment clumping/ring,  central IRH and SRF - stably improved, RPE mottling and clumping, Drusen, no heme, +subretinal fibrosis, trace cystic changes--slightly increased Blunted foveal reflex, large area of GA with disciform scar and subretinal fibrosis, central pigment clumping, no heme   Vessels attenuated, Tortuous attenuated, mild tortuosity, mild AV crossing changes   Periphery Attached, scattered reticular degeneration, paving stone degeneration, no heme Attached, scattered reticular degeneration, scattered paving stone degeneration, No heme           Refraction     Wearing Rx       Sphere Cylinder Axis   Right -6.50 +2.00 152   Left -5.25 +1.00 018    Type: SVL           IMAGING AND PROCEDURES  Imaging and Procedures for 10/31/2024  OCT, Retina - OU - Both Eyes       Right Eye Quality was good. Central Foveal Thickness: 276. Progression has worsened. Findings include no IRF, no SRF, abnormal foveal contour, subretinal hyper-reflective material, choroidal neovascular membrane, intraretinal fluid, pigment epithelial detachment, subretinal fluid (persistent IRF / cystic changes overlying central SRHM/CNV--slightly increased).   Left Eye Quality was good. Central Foveal Thickness: 303. Progression has been stable. Findings include no IRF, no SRF, abnormal foveal contour, subretinal hyper-reflective material, lamellar hole, outer retinal atrophy (large central disciform scar with ORA, foveal notch, trace cystic changes and temporal edema -- all stable).   Notes *Images captured and stored on drive  Diagnosis / Impression:  exu ARMD OU OD: persistent IRF / cystic changes overlying central SRHM/CNV--slightly increased OS: large central disciform scar with ORA, Foveal notch, trace cystic changes and temporal edema -- all stable  Clinical management:  See below  Abbreviations: NFP - Normal foveal profile. CME - cystoid macular edema. PED - pigment epithelial detachment. IRF - intraretinal fluid.  SRF - subretinal fluid. EZ - ellipsoid zone. ERM - epiretinal membrane. ORA - outer retinal atrophy. ORT - outer retinal tubulation. SRHM - subretinal hyper-reflective material. IRHM - intraretinal hyper-reflective material      Intravitreal Injection, Pharmacologic Agent - OD - Right Eye       Time Out 10/31/2024. 12:33 PM. Confirmed correct patient, procedure, site, and patient consented.   Anesthesia Topical anesthesia was used. Anesthetic medications included Lidocaine  2%, Proparacaine 0.5%.   Procedure Preparation included 5% betadine to ocular surface, eyelid speculum. A supplied (32g) needle was used.   Injection: 6 mg faricimab -svoa 6 MG/0.05ML   Route: Intravitreal, Site: Right Eye   NDC: 49757-903-98, Lot: [id number], Expiration date: 09/23/2025, Waste: 0 mL   Post-op Post injection exam found visual acuity of at least counting fingers. The patient tolerated the procedure well. There were no complications. The patient received written and verbal post procedure care education. Post injection medications were not given.             ASSESSMENT/PLAN:   ICD-10-CM   1. Exudative age-related macular degeneration of right eye with active choroidal neovascularization (HCC)  H35.3211 OCT, Retina - OU - Both Eyes    Intravitreal Injection, Pharmacologic Agent - OD - Right Eye    faricimab -svoa (VABYSMO ) 6mg /0.56mL intravitreal injection    2. Exudative age-related macular degeneration of left eye with active choroidal neovascularization (HCC)  H35.3221     3. Essential hypertension  I10     4. Hypertensive retinopathy of both eyes  H35.033     5. Pseudophakia, both eyes  Z96.1       1. Exudative age related macular degeneration OD  - delayed f/u (11.20.24 to 01.06.25): 6.5 wks instead of 4 -- death in family - lost to follow up from 8 weeks to 7 months (12.19.23 to 07.02.24) due to cardiac issues -- vision decreased to 20/100 OD on 07.02.24  - pt initially presented w/ 2  wk history of decreased vision OD (12.21.2021) - history of intravitreal injections OS with Dr. Elner in 2017 and earlier -- pt reports stopping visits after the development of a corneal abrasion from a lid speculum. - s/p IVA OD #1 (12.21.21), #2 (01.24.22), #3 (2.21.22), #4 (03.22.22), #5 (4.26.22), #6 (5.31.22), #7 (7.12.22), #8 (8.16.22), #9 (9.20.22), #10 (10.25.22), #11 (12.6.22), #12 (01.10.23), #13 (02.14.23), #14 (03.21.23), #15 (04.25.23), #16 06.06.23), #17 (08.01.23), #18 (09.12.23), #19 (10.31.23), #20 (12.19.23) -- IVA resistance ==================  - s/p IVE OD #1 (07.02.24), #2 (07.30.24),#3 (08.27.24), #4 (09.25.24), #5 (10.23.24), #6 (11.20.24), #7 (01.06.25), #8 (02.03.25), #9 (03.11.25) -- IVE resistance  ==================  - s/p IVV OD #1 (04.15.25), #2 (05.20.25), #3 (06.24.25), #4 (07.29.25), #5 (09.02.25) #6 (09.30.25)  **history of increased SRF at 6 wk interval, noted on 12.6.22**  **history of increased SRF at 8 wk interval, noted on 08.01.23** - OCT shows persistent IRF / cystic changes overlying central SRHM/CNV--slightly increased at 5+ weeks  - BCVA OD 20/50 from 20/40             - Recommend IVV OD #7 today, 11.07.25 w/ f/u in 4-5 wks  - RBA of procedure discussed, questions  answered  - IVE informed consent obtained and signed, 07.02.24 (OD)  - see procedure note  - f/u in 4-5 wks -- DFE/OCT, possible injection   2. Exudative age related macular degeneration, OS - OS with history inactive disciform scar, but new focal IRH noted 5.31.22 -- stably resolved on exam today - s/p IVA OS #1 (05.31.22), #2 (07.12.22), #3 (8.15.22), #4 (9.20.22) for focal [facility] - history of intravitreal injections OS with Dr. Elner in 2017 and earlier -- pt reports stopping visits after the development of a corneal abrasion from a lid speculum  - before being referred here, had not seen a retina specialist since 2017  - exam and OCT OS shows large central disciform scar with ORA, Foveal  notch, trace cystic changes and temporal edema -- all stable   - BCVA CF OS -- stable - recommend holding IVA OS today -- pt in agreement - f/u 4-5 weeks DFE, OCT  3,4. Hypertensive retinopathy OU - discussed importance of tight BP control - monitor  5. Pseudophakia OU  - s/p CE/IOL (Dr. Fleeta)  - IOL in good position, doing well  - monitor  Ophthalmic Meds Ordered this visit:  Meds ordered this encounter  Medications   faricimab -svoa (VABYSMO ) 6mg /0.28mL intravitreal injection     Return for 4-5wks exu ARMD OD, DFE, OCT, Possible Injxn.  There are no Patient Instructions on file for this visit.  This document serves as a record of services personally performed by Redell JUDITHANN Hans, MD, PhD. It was created on their behalf by Delon Newness COT, an ophthalmic technician. The creation of this record is the provider's dictation and/or activities during the visit.    Electronically signed by: Delon Newness COT 11.04.25  4:14 PM  This document serves as a record of services personally performed by Redell JUDITHANN Hans, MD, PhD. It was created on their behalf by Almetta Pesa, an ophthalmic technician. The creation of this record is the provider's dictation and/or activities during the visit.    Electronically signed by: Almetta Pesa, OA, 11/08/24  4:14 PM  Redell JUDITHANN Hans, M.D., Ph.D. Diseases & Surgery of the Retina and Vitreous [facility] 10/31/2024   I have reviewed the above documentation for accuracy and completeness, and I agree with the above. Redell JUDITHANN Hans, M.D., Ph.D. 11/08/24 4:22 PM   Abbreviations: M myopia (nearsighted); A astigmatism; H hyperopia (farsighted); P presbyopia; Mrx spectacle prescription;  CTL contact lenses; OD right eye; OS left eye; OU both eyes  XT exotropia; ET esotropia; PEK punctate epithelial keratitis; PEE punctate epithelial erosions; DES dry eye syndrome; MGD meibomian gland dysfunction; ATs artificial tears;  PFAT's preservative free artificial tears; NSC nuclear sclerotic cataract; PSC posterior subcapsular cataract; ERM epi-retinal membrane; PVD posterior vitreous detachment; RD retinal detachment; DM diabetes mellitus; DR diabetic retinopathy; NPDR non-proliferative diabetic retinopathy; PDR proliferative diabetic retinopathy; CSME clinically significant macular edema; DME diabetic macular edema; dbh dot blot hemorrhages; CWS cotton wool spot; POAG primary open angle glaucoma; C/D cup-to-disc ratio; HVF humphrey visual field; GVF goldmann visual field; OCT optical coherence tomography; IOP intraocular pressure; BRVO Branch retinal vein occlusion; CRVO central retinal vein occlusion; CRAO central retinal artery occlusion; BRAO branch retinal artery occlusion; RT retinal tear; SB scleral buckle; PPV pars plana vitrectomy; VH Vitreous hemorrhage; PRP panretinal laser photocoagulation; IVK intravitreal kenalog; VMT vitreomacular traction; MH Macular hole;  NVD neovascularization of the disc; NVE neovascularization elsewhere; AREDS age related eye disease study; ARMD age related macular degeneration; POAG primary  open angle glaucoma; EBMD epithelial/anterior basement membrane dystrophy; ACIOL anterior chamber intraocular lens; IOL intraocular lens; PCIOL posterior chamber intraocular lens; Phaco/IOL phacoemulsification with intraocular lens placement; PRK photorefractive keratectomy; LASIK laser assisted in situ keratomileusis; HTN hypertension; DM diabetes mellitus; COPD chronic obstructive pulmonary disease

## 2024-10-29 DIAGNOSIS — Z23 Encounter for immunization: Secondary | ICD-10-CM | POA: Diagnosis not present

## 2024-10-29 DIAGNOSIS — N1832 Chronic kidney disease, stage 3b: Secondary | ICD-10-CM | POA: Diagnosis not present

## 2024-10-29 DIAGNOSIS — J439 Emphysema, unspecified: Secondary | ICD-10-CM | POA: Diagnosis not present

## 2024-10-29 DIAGNOSIS — I5022 Chronic systolic (congestive) heart failure: Secondary | ICD-10-CM | POA: Diagnosis not present

## 2024-10-29 DIAGNOSIS — I10 Essential (primary) hypertension: Secondary | ICD-10-CM | POA: Diagnosis not present

## 2024-10-31 ENCOUNTER — Ambulatory Visit (INDEPENDENT_AMBULATORY_CARE_PROVIDER_SITE_OTHER): Admitting: Ophthalmology

## 2024-10-31 ENCOUNTER — Encounter (INDEPENDENT_AMBULATORY_CARE_PROVIDER_SITE_OTHER): Payer: Self-pay | Admitting: Ophthalmology

## 2024-10-31 DIAGNOSIS — H353231 Exudative age-related macular degeneration, bilateral, with active choroidal neovascularization: Secondary | ICD-10-CM

## 2024-10-31 DIAGNOSIS — Z961 Presence of intraocular lens: Secondary | ICD-10-CM | POA: Diagnosis not present

## 2024-10-31 DIAGNOSIS — H353221 Exudative age-related macular degeneration, left eye, with active choroidal neovascularization: Secondary | ICD-10-CM

## 2024-10-31 DIAGNOSIS — H353211 Exudative age-related macular degeneration, right eye, with active choroidal neovascularization: Secondary | ICD-10-CM

## 2024-10-31 DIAGNOSIS — I10 Essential (primary) hypertension: Secondary | ICD-10-CM | POA: Diagnosis not present

## 2024-10-31 DIAGNOSIS — H35033 Hypertensive retinopathy, bilateral: Secondary | ICD-10-CM | POA: Diagnosis not present

## 2024-10-31 MED ORDER — FARICIMAB-SVOA 6 MG/0.05ML IZ SOLN
6.0000 mg | INTRAVITREAL | Status: AC | PRN
  Administered 2024-10-31: 6 mg via INTRAVITREAL

## 2024-11-03 DIAGNOSIS — I10 Essential (primary) hypertension: Secondary | ICD-10-CM | POA: Diagnosis not present

## 2024-11-03 DIAGNOSIS — I700 Atherosclerosis of aorta: Secondary | ICD-10-CM | POA: Diagnosis not present

## 2024-11-03 DIAGNOSIS — N1832 Chronic kidney disease, stage 3b: Secondary | ICD-10-CM | POA: Diagnosis not present

## 2024-11-03 DIAGNOSIS — J439 Emphysema, unspecified: Secondary | ICD-10-CM | POA: Diagnosis not present

## 2024-11-04 DIAGNOSIS — Z79899 Other long term (current) drug therapy: Secondary | ICD-10-CM | POA: Diagnosis not present

## 2024-11-05 LAB — BASIC METABOLIC PANEL WITH GFR
BUN/Creatinine Ratio: 12 (ref 12–28)
BUN: 14 mg/dL (ref 8–27)
CO2: 23 mmol/L (ref 20–29)
Calcium: 9.3 mg/dL (ref 8.7–10.3)
Chloride: 105 mmol/L (ref 96–106)
Creatinine, Ser: 1.2 mg/dL — ABNORMAL HIGH (ref 0.57–1.00)
Glucose: 88 mg/dL (ref 70–99)
Potassium: 3.5 mmol/L (ref 3.5–5.2)
Sodium: 143 mmol/L (ref 134–144)
eGFR: 47 mL/min/1.73 — ABNORMAL LOW

## 2024-11-05 NOTE — Telephone Encounter (Signed)
Spoke with pt regarding lab results. Pt verbalized understanding and had no further questions.

## 2024-11-13 ENCOUNTER — Other Ambulatory Visit: Payer: Self-pay

## 2024-11-13 ENCOUNTER — Emergency Department (HOSPITAL_COMMUNITY)

## 2024-11-13 ENCOUNTER — Inpatient Hospital Stay (HOSPITAL_COMMUNITY)
Admission: EM | Admit: 2024-11-13 | Discharge: 2024-11-19 | DRG: 276 | Disposition: A | Discharge status: Home Health | Attending: Student in an Organized Health Care Education/Training Program | Admitting: Student in an Organized Health Care Education/Training Program

## 2024-11-13 ENCOUNTER — Encounter (HOSPITAL_COMMUNITY): Payer: Self-pay

## 2024-11-13 DIAGNOSIS — Z8249 Family history of ischemic heart disease and other diseases of the circulatory system: Secondary | ICD-10-CM

## 2024-11-13 DIAGNOSIS — Z9010 Acquired absence of unspecified breast and nipple: Secondary | ICD-10-CM

## 2024-11-13 DIAGNOSIS — I472 Ventricular tachycardia, unspecified: Principal | ICD-10-CM | POA: Diagnosis present

## 2024-11-13 DIAGNOSIS — Z800 Family history of malignant neoplasm of digestive organs: Secondary | ICD-10-CM | POA: Diagnosis not present

## 2024-11-13 DIAGNOSIS — Z885 Allergy status to narcotic agent status: Secondary | ICD-10-CM

## 2024-11-13 DIAGNOSIS — J449 Chronic obstructive pulmonary disease, unspecified: Secondary | ICD-10-CM | POA: Diagnosis present

## 2024-11-13 DIAGNOSIS — I110 Hypertensive heart disease with heart failure: Secondary | ICD-10-CM | POA: Diagnosis present

## 2024-11-13 DIAGNOSIS — Z860100 Personal history of colon polyps, unspecified: Secondary | ICD-10-CM

## 2024-11-13 DIAGNOSIS — R519 Headache, unspecified: Secondary | ICD-10-CM | POA: Diagnosis present

## 2024-11-13 DIAGNOSIS — R55 Syncope and collapse: Secondary | ICD-10-CM | POA: Diagnosis not present

## 2024-11-13 DIAGNOSIS — I2510 Atherosclerotic heart disease of native coronary artery without angina pectoris: Secondary | ICD-10-CM | POA: Diagnosis present

## 2024-11-13 DIAGNOSIS — R531 Weakness: Secondary | ICD-10-CM | POA: Diagnosis present

## 2024-11-13 DIAGNOSIS — R0989 Other specified symptoms and signs involving the circulatory and respiratory systems: Secondary | ICD-10-CM | POA: Diagnosis not present

## 2024-11-13 DIAGNOSIS — H9190 Unspecified hearing loss, unspecified ear: Secondary | ICD-10-CM | POA: Diagnosis present

## 2024-11-13 DIAGNOSIS — I213 ST elevation (STEMI) myocardial infarction of unspecified site: Secondary | ICD-10-CM | POA: Diagnosis not present

## 2024-11-13 DIAGNOSIS — H3530 Unspecified macular degeneration: Secondary | ICD-10-CM | POA: Diagnosis present

## 2024-11-13 DIAGNOSIS — R112 Nausea with vomiting, unspecified: Secondary | ICD-10-CM | POA: Diagnosis not present

## 2024-11-13 DIAGNOSIS — Z808 Family history of malignant neoplasm of other organs or systems: Secondary | ICD-10-CM

## 2024-11-13 DIAGNOSIS — R918 Other nonspecific abnormal finding of lung field: Secondary | ICD-10-CM | POA: Diagnosis not present

## 2024-11-13 DIAGNOSIS — R456 Violent behavior: Secondary | ICD-10-CM | POA: Diagnosis not present

## 2024-11-13 DIAGNOSIS — Z853 Personal history of malignant neoplasm of breast: Secondary | ICD-10-CM

## 2024-11-13 DIAGNOSIS — R410 Disorientation, unspecified: Secondary | ICD-10-CM | POA: Diagnosis not present

## 2024-11-13 DIAGNOSIS — Z923 Personal history of irradiation: Secondary | ICD-10-CM

## 2024-11-13 DIAGNOSIS — I499 Cardiac arrhythmia, unspecified: Secondary | ICD-10-CM | POA: Diagnosis not present

## 2024-11-13 DIAGNOSIS — E669 Obesity, unspecified: Secondary | ICD-10-CM | POA: Diagnosis present

## 2024-11-13 DIAGNOSIS — Z95810 Presence of automatic (implantable) cardiac defibrillator: Secondary | ICD-10-CM | POA: Diagnosis not present

## 2024-11-13 DIAGNOSIS — F32A Depression, unspecified: Secondary | ICD-10-CM | POA: Diagnosis present

## 2024-11-13 DIAGNOSIS — K219 Gastro-esophageal reflux disease without esophagitis: Secondary | ICD-10-CM | POA: Diagnosis present

## 2024-11-13 DIAGNOSIS — Z79890 Hormone replacement therapy (postmenopausal): Secondary | ICD-10-CM | POA: Diagnosis not present

## 2024-11-13 DIAGNOSIS — Z888 Allergy status to other drugs, medicaments and biological substances status: Secondary | ICD-10-CM

## 2024-11-13 DIAGNOSIS — I4729 Other ventricular tachycardia: Secondary | ICD-10-CM | POA: Diagnosis present

## 2024-11-13 DIAGNOSIS — E785 Hyperlipidemia, unspecified: Secondary | ICD-10-CM | POA: Diagnosis present

## 2024-11-13 DIAGNOSIS — Z720 Tobacco use: Secondary | ICD-10-CM

## 2024-11-13 DIAGNOSIS — H35039 Hypertensive retinopathy, unspecified eye: Secondary | ICD-10-CM | POA: Diagnosis present

## 2024-11-13 DIAGNOSIS — I447 Left bundle-branch block, unspecified: Secondary | ICD-10-CM | POA: Diagnosis present

## 2024-11-13 DIAGNOSIS — I959 Hypotension, unspecified: Secondary | ICD-10-CM | POA: Diagnosis present

## 2024-11-13 DIAGNOSIS — I10 Essential (primary) hypertension: Secondary | ICD-10-CM | POA: Diagnosis not present

## 2024-11-13 DIAGNOSIS — I5022 Chronic systolic (congestive) heart failure: Secondary | ICD-10-CM | POA: Diagnosis not present

## 2024-11-13 DIAGNOSIS — R000 Tachycardia, unspecified: Secondary | ICD-10-CM | POA: Diagnosis not present

## 2024-11-13 DIAGNOSIS — Z8674 Personal history of sudden cardiac arrest: Secondary | ICD-10-CM | POA: Diagnosis not present

## 2024-11-13 DIAGNOSIS — Z9221 Personal history of antineoplastic chemotherapy: Secondary | ICD-10-CM

## 2024-11-13 DIAGNOSIS — G629 Polyneuropathy, unspecified: Secondary | ICD-10-CM | POA: Diagnosis present

## 2024-11-13 DIAGNOSIS — I428 Other cardiomyopathies: Secondary | ICD-10-CM | POA: Diagnosis present

## 2024-11-13 DIAGNOSIS — R001 Bradycardia, unspecified: Secondary | ICD-10-CM | POA: Diagnosis present

## 2024-11-13 DIAGNOSIS — I5023 Acute on chronic systolic (congestive) heart failure: Secondary | ICD-10-CM | POA: Diagnosis not present

## 2024-11-13 DIAGNOSIS — E876 Hypokalemia: Secondary | ICD-10-CM | POA: Diagnosis present

## 2024-11-13 DIAGNOSIS — J90 Pleural effusion, not elsewhere classified: Secondary | ICD-10-CM | POA: Diagnosis not present

## 2024-11-13 DIAGNOSIS — Z803 Family history of malignant neoplasm of breast: Secondary | ICD-10-CM

## 2024-11-13 DIAGNOSIS — E039 Hypothyroidism, unspecified: Secondary | ICD-10-CM | POA: Diagnosis present

## 2024-11-13 DIAGNOSIS — I462 Cardiac arrest due to underlying cardiac condition: Secondary | ICD-10-CM | POA: Diagnosis present

## 2024-11-13 DIAGNOSIS — I469 Cardiac arrest, cause unspecified: Secondary | ICD-10-CM | POA: Diagnosis not present

## 2024-11-13 DIAGNOSIS — Z791 Long term (current) use of non-steroidal anti-inflammatories (NSAID): Secondary | ICD-10-CM

## 2024-11-13 DIAGNOSIS — Z634 Disappearance and death of family member: Secondary | ICD-10-CM

## 2024-11-13 DIAGNOSIS — Z79899 Other long term (current) drug therapy: Secondary | ICD-10-CM

## 2024-11-13 DIAGNOSIS — Z801 Family history of malignant neoplasm of trachea, bronchus and lung: Secondary | ICD-10-CM

## 2024-11-13 DIAGNOSIS — I491 Atrial premature depolarization: Secondary | ICD-10-CM | POA: Diagnosis not present

## 2024-11-13 DIAGNOSIS — Z7951 Long term (current) use of inhaled steroids: Secondary | ICD-10-CM

## 2024-11-13 DIAGNOSIS — Z6838 Body mass index (BMI) 38.0-38.9, adult: Secondary | ICD-10-CM

## 2024-11-13 DIAGNOSIS — Z91048 Other nonmedicinal substance allergy status: Secondary | ICD-10-CM

## 2024-11-13 DIAGNOSIS — I493 Ventricular premature depolarization: Secondary | ICD-10-CM | POA: Diagnosis present

## 2024-11-13 DIAGNOSIS — R079 Chest pain, unspecified: Secondary | ICD-10-CM | POA: Diagnosis not present

## 2024-11-13 DIAGNOSIS — I517 Cardiomegaly: Secondary | ICD-10-CM | POA: Diagnosis not present

## 2024-11-13 DIAGNOSIS — Z91040 Latex allergy status: Secondary | ICD-10-CM

## 2024-11-13 DIAGNOSIS — Z90710 Acquired absence of both cervix and uterus: Secondary | ICD-10-CM

## 2024-11-13 DIAGNOSIS — R002 Palpitations: Secondary | ICD-10-CM | POA: Diagnosis present

## 2024-11-13 DIAGNOSIS — I5020 Unspecified systolic (congestive) heart failure: Secondary | ICD-10-CM | POA: Diagnosis not present

## 2024-11-13 LAB — COMPREHENSIVE METABOLIC PANEL WITH GFR
ALT: 24 U/L (ref 0–44)
AST: 34 U/L (ref 15–41)
Albumin: 3.6 g/dL (ref 3.5–5.0)
Alkaline Phosphatase: 35 U/L — ABNORMAL LOW (ref 38–126)
Anion gap: 12 (ref 5–15)
BUN: 13 mg/dL (ref 8–23)
CO2: 23 mmol/L (ref 22–32)
Calcium: 8.8 mg/dL — ABNORMAL LOW (ref 8.9–10.3)
Chloride: 101 mmol/L (ref 98–111)
Creatinine, Ser: 1.15 mg/dL — ABNORMAL HIGH (ref 0.44–1.00)
GFR, Estimated: 49 mL/min — ABNORMAL LOW (ref >60)
Glucose, Bld: 121 mg/dL — ABNORMAL HIGH (ref 70–99)
Potassium: 3 mmol/L — ABNORMAL LOW (ref 3.5–5.1)
Sodium: 136 mmol/L (ref 135–145)
Total Bilirubin: 0.4 mg/dL (ref 0.0–1.2)
Total Protein: 6.6 g/dL (ref 6.5–8.1)

## 2024-11-13 LAB — CBC WITH DIFFERENTIAL/PLATELET
Abs Immature Granulocytes: 0.04 K/uL (ref 0.00–0.07)
Basophils Absolute: 0.1 K/uL (ref 0.0–0.1)
Basophils Relative: 1 %
Eosinophils Absolute: 0 K/uL (ref 0.0–0.5)
Eosinophils Relative: 0 %
HCT: 38 % (ref 36.0–46.0)
Hemoglobin: 12.2 g/dL (ref 12.0–15.0)
Immature Granulocytes: 0 %
Lymphocytes Relative: 17 %
Lymphs Abs: 1.8 K/uL (ref 0.7–4.0)
MCH: 30.2 pg (ref 26.0–34.0)
MCHC: 32.1 g/dL (ref 30.0–36.0)
MCV: 94.1 fL (ref 80.0–100.0)
Monocytes Absolute: 0.8 K/uL (ref 0.1–1.0)
Monocytes Relative: 7 %
Neutro Abs: 8.1 K/uL — ABNORMAL HIGH (ref 1.7–7.7)
Neutrophils Relative %: 75 %
Platelets: 325 K/uL (ref 150–400)
RBC: 4.04 MIL/uL (ref 3.87–5.11)
RDW: 13.3 % (ref 11.5–15.5)
WBC: 10.9 K/uL — ABNORMAL HIGH (ref 4.0–10.5)
nRBC: 0 % (ref 0.0–0.2)

## 2024-11-13 LAB — I-STAT CHEM 8, ED
BUN: 16 mg/dL (ref 8–23)
Calcium, Ion: 1.11 mmol/L — ABNORMAL LOW (ref 1.15–1.40)
Chloride: 102 mmol/L (ref 98–111)
Creatinine, Ser: 1.3 mg/dL — ABNORMAL HIGH (ref 0.44–1.00)
Glucose, Bld: 119 mg/dL — ABNORMAL HIGH (ref 70–99)
HCT: 37 % (ref 36.0–46.0)
Hemoglobin: 12.6 g/dL (ref 12.0–15.0)
Potassium: 3.1 mmol/L — ABNORMAL LOW (ref 3.5–5.1)
Sodium: 140 mmol/L (ref 135–145)
TCO2: 28 mmol/L (ref 22–32)

## 2024-11-13 LAB — MAGNESIUM: Magnesium: 1.7 mg/dL (ref 1.7–2.4)

## 2024-11-13 LAB — TROPONIN I (HIGH SENSITIVITY): Troponin I (High Sensitivity): 81 ng/L — ABNORMAL HIGH (ref <18)

## 2024-11-13 MED ORDER — ISOPROTERENOL HCL 0.2 MG/ML IJ SOLN
2.0000 ug/min | INTRAVENOUS | Status: DC
  Administered 2024-11-13 – 2024-11-14 (×2): 2 ug/min via INTRAVENOUS
  Filled 2024-11-13 (×2): qty 5

## 2024-11-13 MED ORDER — LIDOCAINE IN D5W 4-5 MG/ML-% IV SOLN
1.0000 mg/min | INTRAVENOUS | Status: DC
  Administered 2024-11-13 – 2024-11-15 (×2): 1 mg/min via INTRAVENOUS
  Filled 2024-11-13 (×2): qty 500

## 2024-11-13 MED ORDER — AMIODARONE HCL IN DEXTROSE 360-4.14 MG/200ML-% IV SOLN: 30.0000 mg/h | INTRAVENOUS | Status: DC

## 2024-11-13 MED ORDER — POTASSIUM CHLORIDE CRYS ER 20 MEQ PO TBCR
40.0000 meq | EXTENDED_RELEASE_TABLET | Freq: Once | ORAL | Status: AC
  Administered 2024-11-13: 40 meq via ORAL
  Filled 2024-11-13: qty 2

## 2024-11-13 MED ORDER — ROSUVASTATIN CALCIUM 20 MG PO TABS
20.0000 mg | ORAL_TABLET | Freq: Every day | ORAL | Status: DC
  Administered 2024-11-14 – 2024-11-19 (×6): 20 mg via ORAL
  Filled 2024-11-13 (×6): qty 1

## 2024-11-13 MED ORDER — LIDOCAINE BOLUS VIA INFUSION
50.0000 mg | Freq: Once | INTRAVENOUS | Status: AC
  Administered 2024-11-13: 50 mg via INTRAVENOUS
  Filled 2024-11-13: qty 52

## 2024-11-13 MED ORDER — LEVOTHYROXINE SODIUM 100 MCG PO TABS
100.0000 ug | ORAL_TABLET | Freq: Every day | ORAL | Status: DC
  Administered 2024-11-14: 100 ug via ORAL
  Filled 2024-11-13: qty 1

## 2024-11-13 MED ORDER — METOCLOPRAMIDE HCL 5 MG/ML IJ SOLN
INTRAMUSCULAR | Status: AC
  Administered 2024-11-13: 10 mg via INTRAVENOUS
  Filled 2024-11-13: qty 2

## 2024-11-13 MED ORDER — EZETIMIBE 10 MG PO TABS
10.0000 mg | ORAL_TABLET | Freq: Every day | ORAL | Status: DC
  Administered 2024-11-14 – 2024-11-19 (×6): 10 mg via ORAL
  Filled 2024-11-13 (×6): qty 1

## 2024-11-13 MED ORDER — SPIRONOLACTONE 25 MG PO TABS
25.0000 mg | ORAL_TABLET | Freq: Every day | ORAL | Status: DC
  Administered 2024-11-14 – 2024-11-19 (×6): 25 mg via ORAL
  Filled 2024-11-13 (×6): qty 1

## 2024-11-13 MED ORDER — AMIODARONE HCL IN DEXTROSE 360-4.14 MG/200ML-% IV SOLN
60.0000 mg/h | INTRAVENOUS | Status: DC
  Administered 2024-11-13: 60 mg/h via INTRAVENOUS

## 2024-11-13 MED ORDER — POTASSIUM CHLORIDE 10 MEQ/100ML IV SOLN
10.0000 meq | INTRAVENOUS | Status: AC
  Administered 2024-11-13 – 2024-11-14 (×2): 10 meq via INTRAVENOUS
  Filled 2024-11-13 (×2): qty 100

## 2024-11-13 MED ORDER — ACETAMINOPHEN 325 MG PO TABS
650.0000 mg | ORAL_TABLET | ORAL | Status: DC | PRN
  Administered 2024-11-15 – 2024-11-18 (×9): 650 mg via ORAL
  Filled 2024-11-13 (×10): qty 2

## 2024-11-13 MED ORDER — CHLORHEXIDINE GLUCONATE CLOTH 2 % EX PADS
6.0000 | MEDICATED_PAD | Freq: Every day | CUTANEOUS | Status: DC
  Administered 2024-11-14 – 2024-11-19 (×6): 6 via TOPICAL

## 2024-11-13 MED ORDER — METOCLOPRAMIDE HCL 5 MG/ML IJ SOLN: 10.0000 mg | Freq: Four times a day (QID) | INTRAMUSCULAR | Status: DC | PRN

## 2024-11-13 MED ORDER — ORAL CARE MOUTH RINSE: 15.0000 mL | OROMUCOSAL | Status: DC | PRN

## 2024-11-13 MED ORDER — IPRATROPIUM BROMIDE 0.06 % NA SOLN
2.0000 | Freq: Four times a day (QID) | NASAL | Status: DC
  Administered 2024-11-14 – 2024-11-19 (×21): 2 via NASAL
  Filled 2024-11-13: qty 15

## 2024-11-13 MED ORDER — AMIODARONE LOAD VIA INFUSION
150.0000 mg | Freq: Once | INTRAVENOUS | Status: AC
  Administered 2024-11-13: 150 mg via INTRAVENOUS
  Filled 2024-11-13: qty 83.34

## 2024-11-13 NOTE — ED Triage Notes (Signed)
 Patient bib [facility] from home. She had a LOC from standing and family could not find a pulse. She received 2 minutes of CPR from family. When fire arrived patient hada pulse was alert but disoriented. When EMS arrived on scene patient was alert and orientedx4. STEMI was called but cancelled before patient arrived to ED. EMS reports rhythm has been all over the place. (Bigeminy, trigemeny, V-tach) Rate has been from 30's-140's. EMS reports her husband passed away this morning that some of the symptoms could be emotionally induced.   She has a history of shob, and cardiac arrest outside of the hospital.

## 2024-11-13 NOTE — H&P (Signed)
 Cardiology Admission History and Physical   Patient ID: Kimberly Waters MRN: [id number]; DOB: [birth year]   Admission date: 11/13/2024  PCP:  Dwight Trula SQUIBB, MD   [facility] Providers Cardiologist:  Oneil Parchment, MD       Chief Complaint: Syncope, PMVT  Patient Profile: Kimberly Waters is a 78 y.o. female with history of breast cancer status post mastectomy, chemo and radiation, COPD, tobacco use, nonobstructive CAD, prior history of polymorphic VT in the setting of prolonged QTc and hypothyroidism who is being seen 11/13/2024 for the evaluation of syncope and polymorphic VT.  History of Present Illness: Kimberly Waters has a history as above.  She had a hospitalization in 2024 for polymorphic VT in the setting of prolonged QTc and RonT in the setting of some QTc prolonging medications as well as uncontrolled hypothyroidism.  Admitted to the ICU on Tylenol  and lidocaine .  She was DC'd home on a LifeVest, cardiac MRI revealed EF 41% without any delayed myocardial enhancement.  Seen by EP felt that she did not need primary prevention device.  Unfortunately, within the last 24 hours her husband passed away, she has had significant increased adrenergic drive.  Has not been eating and drinking very well.  Last night noticed some uneasy feelings and palpitations.  Continued intermittently until today for which she had syncope/arrest and noted bradycardia and tachyarrhythmias on pulse checks.  Daughter and son called EMS.  2 minutes of CPR per family, mental status intact.  She was activated for STEMI call but canceled, noted to be in bigeminy, trigeminy and VT with heart rates in the 30s to 140s.  On arrival to the ED she was having episodes of sustained polymorphic VT with R on T.  QTc prolonged at 471, heart rates averaging in the 50s.  Sinus bradycardia.  Given Amio in the ED.  I was called for consultation, transition to lidocaine  given history of prolonged QTc, started isoproterenol  for  increased heart rate.  On my exam she has no complaints, denies chest pain, shortness of breath.  She is very hard of hearing.  Workup notable for potassium 3.1, creatinine 1.3, calcium  1.1.  Chest x-ray unremarkable.   Past Medical History:  Diagnosis Date   Allergy    Breast cancer (HCC)    Colon polyps    Depression    Dyspnea    SEASONAL   Family history of breast cancer    Family history of colon cancer    GERD (gastroesophageal reflux disease)    History of radiation therapy 06/05/17-07/20/17   right chest wall 50.4 Gy in 28 fractions, right axillary nodal region 45 Gy in 25 fractions   Hyperlipidemia    Hypertension    Hypertensive retinopathy    Hypothyroidism    Macular degeneration    Peripheral neuropathy 06/18/2019   PONV (postoperative nausea and vomiting)    Past Surgical History:  Procedure Laterality Date   ABDOMINAL HYSTERECTOMY     CATARACT EXTRACTION, BILATERAL Bilateral    [facility]. [facility]   KNEE SURGERY Right    LEFT HEART CATH AND CORONARY ANGIOGRAPHY N/A 02/02/2023   Procedure: LEFT HEART CATH AND CORONARY ANGIOGRAPHY;  Surgeon: Burnard Debby LABOR, MD;  Location: [facility] INVASIVE CV LAB;  Service: Cardiovascular;  Laterality: N/A;   MASTECTOMY W/ SENTINEL NODE BIOPSY Right 03/05/2017   Procedure: BILATERAL TOTAL MASTECTOMIES WITH RIGHT SENTINEL LYMPH NODE BIOPSIES;  Surgeon: Morene Olives, MD;  Location: [facility] OR;  Service: General;  Laterality: Right;  SHOULDER SURGERY     BILAT     Medications Prior to Admission: Prior to Admission medications   Medication Sig Start Date End Date Taking? Authorizing Provider  acetaminophen  (TYLENOL ) 325 MG tablet Take 2 tablets (650 mg total) by mouth every 4 (four) hours as needed for headache or mild pain. 02/09/23   Lesia Ozell Barter, PA-C  albuterol  (PROVENTIL ,VENTOLIN ) 90 MCG/ACT inhaler Inhale 2 puffs into the lungs every 4 (four) hours as needed. Patient taking differently: Inhale 2 puffs into the  lungs every 4 (four) hours as needed for wheezing or shortness of breath. 04/04/11   Claudene Arthea HERO, DO  arformoterol  (BROVANA ) 15 MCG/2ML NEBU Take 2 mLs (15 mcg total) by nebulization 2 (two) times daily. 07/27/21   Parrett, Madelin RAMAN, NP  betamethasone  dipropionate 0.05 % cream 1 application    [provider]  buPROPion  (WELLBUTRIN  SR) 150 MG 12 hr tablet Take 150 mg by mouth daily.  07/09/19   [provider]  clobetasol  ointment (TEMOVATE ) 0.05 % as needed. 07/23/20   [provider]  escitalopram  (LEXAPRO ) 10 MG tablet Take 1 tablet (10 mg total) by mouth daily. 04/04/18   Vann, Jessica U, DO  ezetimibe  (ZETIA ) 10 MG tablet Take 1 tablet (10 mg total) by mouth daily. 03/08/22   Jeffrie Oneil BROCKS, MD  furosemide  (LASIX ) 40 MG tablet Take 1 tablet (40 mg total) by mouth daily as needed. 10/20/24   Carlin Delon BROCKS, NP  ipratropium (ATROVENT ) 0.06 % nasal spray Place 2 sprays into the nose 4 (four) times daily. 09/17/18   Geronimo Amel, MD  ketoconazole  (NIZORAL ) 2 % cream Apply 1 application topically daily. 07/21/21   Magrinat, Gustav C, MD  levocetirizine (XYZAL ) 5 MG tablet Take 5 mg by mouth as needed for allergies.    [provider]  levothyroxine  (SYNTHROID ) 112 MCG tablet Take 112 mcg by mouth daily before breakfast. 05/28/23   [provider]  melatonin 5 MG TABS Take 1 tablet (5 mg total) by mouth at bedtime. 02/09/23   Lesia Ozell Barter, PA-C  mometasone  (ELOCON ) 0.1 % ointment Apply topically as needed. 08/04/20   [provider]  nitroGLYCERIN  (NITROSTAT ) 0.4 MG SL tablet Place 0.4 mg under the tongue every 5 (five) minutes as needed for chest pain. 10/22/20   [provider]  omeprazole  (PRILOSEC) 20 MG capsule Take 20 mg by mouth daily.    [provider]  potassium chloride  SA (KLOR-CON  M) 20 MEQ tablet TAKE 2 TABLETS BY MOUTH DAILY Patient not taking: Reported on 10/20/2024 04/28/24   Fernande Elspeth BROCKS, MD   revefenacin  (YUPELRI ) 175 MCG/3ML nebulizer solution Take 3 mLs (175 mcg total) by nebulization daily. 07/27/21   Parrett, Madelin RAMAN, NP  rosuvastatin  (CRESTOR ) 20 MG tablet Take 1 tablet (20 mg total) by mouth daily. 03/08/22   Jeffrie Oneil BROCKS, MD  spironolactone  (ALDACTONE ) 25 MG tablet Take 1 tablet (25 mg total) by mouth daily. 10/20/24   Carlin Delon BROCKS, NP  tacrolimus (PROTOPIC) 0.1 % ointment Apply topically as needed. 08/04/20   [provider]     Allergies:    Allergies  Allergen Reactions   Latex Other (See Comments)    Other reaction(s): Other (See Comments) Tears skin.   Amlodipine  Other (See Comments)    Peripheral neuropathy   Gabapentin      Memory loss and made her crazy per daughter at the bedside   Adhesive [Tape] Other (See Comments)    (skin tears)  Tolerates PAPER TAPE   Codeine  Nausea Only   Other Nausea Only    Anesthesia--nausea     Social History:   Social History   Socioeconomic History   Marital status: Married    Spouse name: Not on file   Number of children: 2   Years of education: Not on file   Highest education level: Not on file  Occupational History   Occupation: reitred [employer]  Tobacco Use   Smoking status: Former    Current packs/day: 0.00    Average packs/day: 1 pack/day for 45.0 years (45.0 ttl pk-yrs)    Types: Cigarettes    Start date: 02/23/1972    Quit date: 02/22/2017    Years since quitting: 7.7   Smokeless tobacco: Never  Vaping Use   Vaping status: Never Used  Substance and Sexual Activity   Alcohol use: No    Alcohol/week: 0.0 standard drinks of alcohol   Drug use: No   Sexual activity: Not Currently  Other Topics Concern   Not on file  Social History Narrative   Not on file   Social Drivers of Health   Financial Resource Strain: Not on file  Food Insecurity: Patient Declined (02/01/2023)   Hunger Vital Sign    Worried About Running Out of Food in the Last Year: Patient declined    Ran Out  of Food in the Last Year: Patient declined  Transportation Needs: Patient Declined (02/01/2023)   PRAPARE - Administrator, Civil Service (Medical): Patient declined    Lack of Transportation (Non-Medical): Patient declined  Physical Activity: Not on file  Stress: Not on file  Social Connections: Not on file  Intimate Partner Violence: Patient Declined (02/01/2023)   Humiliation, Afraid, Rape, and Kick questionnaire    Fear of Current or Ex-Partner: Patient declined    Emotionally Abused: Patient declined    Physically Abused: Patient declined    Sexually Abused: Patient declined     Family History:   The patient's family history includes Breast cancer (age of onset: 79) in her cousin; Breast cancer (age of onset: 19) in her paternal aunt; Colon cancer in her maternal uncle and maternal uncle; Colon cancer (age of onset: 39) in her maternal grandfather; Colon cancer (age of onset: 31) in her maternal aunt; Head & neck cancer in her maternal uncle; Heart disease (age of onset: 88) in her mother; Lung cancer in her paternal grandfather.    ROS:  Please see the history of present illness.  All other ROS reviewed and negative.     Physical Exam/Data: Vitals:   11/13/24 2155 11/13/24 2159 11/13/24 2200 11/13/24 2230  BP:   (!) 83/73 122/75  Pulse:  (!) 28 65 (!) 48  Resp:  14 18 17   Temp:      TempSrc:      SpO2:  (!) 89% 91% 97%  Weight: 86.2 kg     Height: 5' (1.524 m)       Intake/Output Summary (Last 24 hours) at 11/13/2024 2302 Last data filed at 11/13/2024 2213 Gross per 24 hour  Intake 84.67 ml  Output --  Net 84.67 ml      11/13/2024    9:55 PM 10/20/2024   10:53 AM 10/16/2024    4:16 PM  Last 3 Weights  Weight (lbs) 190 lb 189 lb 12.8 oz 171 lb 4.8 oz  Weight (kg) 86.183 kg 86.093 kg 77.7 kg     Body mass index is 37.11 kg/m.  General:  Well nourished, well developed, in no acute distress HEENT: Poor dentition Neck: no JVD Vascular: No carotid  bruits; Distal pulses 2+ bilaterally   Cardiac:  normal S1, S2; irregular, bradycardia; no murmur  Lungs:  clear to auscultation bilaterally, decreased breath sounds throughout, no wheezing, rhonchi or rales  Abd: soft, nontender, no hepatomegaly  Ext: no edema Musculoskeletal:  No deformities, BUE and BLE strength normal and equal Skin: warm and dry  Neuro:  CNs 2-12 intact, no focal abnormalities noted Psych:  Normal affect   EKG:  The ECG that was done  was personally reviewed and demonstrates; bradycardia, frequent PVCs, polymorphic VT R on T, nonsustained  Relevant CV Studies: 02/09/2023 cardiac MRI EF 41%, no delayed myocardial enhancement 02/02/2023 cardiac cath normal coronary arteries 01/24/2023 echo EF 50 to 55%, no RWMA, mild concentric LVH, grade 1 DD, trivial MR 07/26/2020 coronary CTA calcium  score 131, 72nd percentile, FFR negative, Lipomatous hypertrophy of interatrial septum   Laboratory Data: High Sensitivity Troponin:  No results for input(s): TROPONINIHS in the last 720 hours.    Chemistry Recent Labs  Lab 11/13/24 2210  NA 140  K 3.1*  CL 102  GLUCOSE 119*  BUN 16  CREATININE 1.30*    No results for input(s): PROT, ALBUMIN, AST, ALT, ALKPHOS, BILITOT in the last 168 hours. Lipids No results for input(s): CHOL, TRIG, HDL, LABVLDL, LDLCALC, CHOLHDL in the last 168 hours. Hematology Recent Labs  Lab 11/13/24 2210  HGB 12.6  HCT 37.0   Thyroid  No results for input(s): TSH, FREET4 in the last 168 hours. BNPNo results for input(s): BNP, PROBNP in the last 168 hours.  DDimer No results for input(s): DDIMER in the last 168 hours.  Radiology/Studies:  DG Chest Port 1 View Result Date: 11/13/2024 CLINICAL DATA:  History of cardiac arrest presenting with ventricular tachycardia. EXAM: PORTABLE CHEST 1 VIEW COMPARISON:  October 16, 2024 FINDINGS: The cardiac silhouette is mildly enlarged and unchanged in size. There is prominence  of the pulmonary vasculature with mild, diffuse, chronic appearing increased interstitial lung markings. Mild atelectasis and/or infiltrate is seen within the retrocardiac region of the left lung base. No pleural effusion or pneumothorax is identified. No acute osseous abnormalities are identified. IMPRESSION: 1. Mild cardiomegaly with mild pulmonary vascular congestion. 2. Mild left basilar atelectasis and/or infiltrate. Electronically Signed   By: Suzen Dials M.D.   On: 11/13/2024 22:30   Assessment and Plan: Syncope and cardiac arrest secondary to polymorphic VT, RonT Prolonged QTc Tachyarrhythmia secondary to combination of prolonged QTc, adrenergic drive, hypokalemia, chronic heart failure.  Significant ectopy improved with lidocaine , still very bradycardic with prolonged QTc will add isoproterenol  and admit to the ICU for close telemetry.  Will repeat echo to see where her left ventricular function stands, at risk for stress cardiomyopathy in the setting of her recent partner passing.  Given her VT arrest and underlying predisposition she needs secondary prevention ICD.  Will consult EP in the morning.  Will also check TSH and obviously replete potassium. - Continue lidocaine  1 mg - Continue isoproterenol , goal heart rate greater than 80 - TSH, continue Synthroid  - Replete K and mag - Repeat TTE - Holding Lexapro  and Wellbutrin  - Admit to telemetry in ICU, 2H; she is full code  Heart failure with reduced ejection fraction (40%) Does not appear volume overload at this time. - Repeat TTE - Continue spironolactone   Nonobstructive CAD - Continue Zetia , Crestor   COPD - Holding albuterol  given hypokalemia and VT -  Continue ipratropium  Risk Assessment/Risk Scores:      Code Status: Full Code  Severity of Illness: The appropriate patient status for this patient is INPATIENT. Inpatient status is judged to be reasonable and necessary in order to provide the required intensity of  service to ensure the patient's safety. The patient's presenting symptoms, physical exam findings, and initial radiographic and laboratory data in the context of their chronic comorbidities is felt to place them at high risk for further clinical deterioration. Furthermore, it is not anticipated that the patient will be medically stable for discharge from the hospital within 2 midnights of admission.   * I certify that at the point of admission it is my clinical judgment that the patient will require inpatient hospital care spanning beyond 2 midnights from the point of admission due to high intensity of service, high risk for further deterioration and high frequency of surveillance required.*  For questions or updates, please contact [name] Please consult [email or web address] for contact info under    Signed, Ozell Bushman, MD  11/13/2024 11:02 PM

## 2024-11-13 NOTE — ED Notes (Signed)
 Main lab called for a update on labs that were sent down

## 2024-11-13 NOTE — ED Notes (Signed)
 Cards at bedside

## 2024-11-13 NOTE — ED Notes (Signed)
 Patient had a run of v-tach with 2 seconds of v-fib. [facility] notified and is at bedside.

## 2024-11-13 NOTE — ED Provider Notes (Signed)
 [facility] Provider Note   CSN: [id number] Arrival date & time: 11/13/24  2133     Patient presents with: Irregular Heart Beat and Loss of Consciousness   Kimberly Waters is a 78 y.o. female.   Pt is a 78 y.o. female with a past medical history of NSVT, mild nonobstructive CAD, atrial septal hypertrophy, history of breast cancer chemo and radiation 2018, COPD-/Dr. Geronimo, tobacco abuse, LBBB who is coming in today initially called a code STEMI which was discontinued and route by cardiology.  Most of the story comes from paramedics as patient cannot remember but report is that patient's husband passed away yesterday and she reports she had a terrible night last night did not sleep well and has felt bad all day.  She had gotten up from the table tonight at her daughter's house and collapsed on the floor with a syncopal event.  Family could not feel a pulse and started CPR for approximately 2 minutes.  When fire arrived patient was awake and mildly confused with nausea and vomiting.  Throughout transport by paramedics patient has had ongoing nausea and various rhythms with intermittent runs of V. tach as well as sinus rhythm.  Blood pressure has been elevated but also have had hypotensive blood pressures as well.  Patient has remained awake and oriented throughout her transport.  She reports she just feels awful.  She complains of ongoing nausea and is not sure if she has any chest pain.  She complains that she has shortness of breath all the time but does not feel that it is worse today.  She has been compliant with her medications and does not think there is been any recent changes.  She denies any fever, cough or diarrhea.  The history is provided by the patient, the EMS personnel and medical records.  Loss of Consciousness      Prior to Admission medications   Medication Sig Start Date End Date Taking? Authorizing Provider  acetaminophen   (TYLENOL ) 325 MG tablet Take 2 tablets (650 mg total) by mouth every 4 (four) hours as needed for headache or mild pain. 02/09/23   Lesia Ozell Barter, PA-C  albuterol  (PROVENTIL ,VENTOLIN ) 90 MCG/ACT inhaler Inhale 2 puffs into the lungs every 4 (four) hours as needed. Patient taking differently: Inhale 2 puffs into the lungs every 4 (four) hours as needed for wheezing or shortness of breath. 04/04/11   Claudene Arthea HERO, DO  arformoterol  (BROVANA ) 15 MCG/2ML NEBU Take 2 mLs (15 mcg total) by nebulization 2 (two) times daily. 07/27/21   Parrett, Madelin RAMAN, NP  betamethasone  dipropionate 0.05 % cream 1 application    [provider]  buPROPion  (WELLBUTRIN  SR) 150 MG 12 hr tablet Take 150 mg by mouth daily.  07/09/19   [provider]  clobetasol  ointment (TEMOVATE ) 0.05 % as needed. 07/23/20   [provider]  escitalopram  (LEXAPRO ) 10 MG tablet Take 1 tablet (10 mg total) by mouth daily. 04/04/18   Vann, Jessica U, DO  ezetimibe  (ZETIA ) 10 MG tablet Take 1 tablet (10 mg total) by mouth daily. 03/08/22   Jeffrie Oneil BROCKS, MD  furosemide  (LASIX ) 40 MG tablet Take 1 tablet (40 mg total) by mouth daily as needed. 10/20/24   Carlin Delon BROCKS, NP  ipratropium (ATROVENT ) 0.06 % nasal spray Place 2 sprays into the nose 4 (four) times daily. 09/17/18   Geronimo Amel, MD  ketoconazole  (NIZORAL ) 2 % cream Apply 1 application topically daily.  07/21/21   Magrinat, Gustav C, MD  levocetirizine (XYZAL ) 5 MG tablet Take 5 mg by mouth as needed for allergies.    [provider]  levothyroxine  (SYNTHROID ) 112 MCG tablet Take 112 mcg by mouth daily before breakfast. 05/28/23   [provider]  melatonin 5 MG TABS Take 1 tablet (5 mg total) by mouth at bedtime. 02/09/23   Lesia Ozell Barter, PA-C  mometasone  (ELOCON ) 0.1 % ointment Apply topically as needed. 08/04/20   [provider]  nitroGLYCERIN  (NITROSTAT ) 0.4 MG SL tablet Place 0.4 mg under the tongue every 5  (five) minutes as needed for chest pain. 10/22/20   [provider]  omeprazole  (PRILOSEC) 20 MG capsule Take 20 mg by mouth daily.    [provider]  potassium chloride  SA (KLOR-CON  M) 20 MEQ tablet TAKE 2 TABLETS BY MOUTH DAILY Patient not taking: Reported on 10/20/2024 04/28/24   Fernande Elspeth BROCKS, MD  revefenacin  (YUPELRI ) 175 MCG/3ML nebulizer solution Take 3 mLs (175 mcg total) by nebulization daily. 07/27/21   Parrett, Madelin RAMAN, NP  rosuvastatin  (CRESTOR ) 20 MG tablet Take 1 tablet (20 mg total) by mouth daily. 03/08/22   Jeffrie Oneil BROCKS, MD  spironolactone  (ALDACTONE ) 25 MG tablet Take 1 tablet (25 mg total) by mouth daily. 10/20/24   Carlin Delon BROCKS, NP  tacrolimus (PROTOPIC) 0.1 % ointment Apply topically as needed. 08/04/20   [provider]    Allergies: Latex, Amlodipine , Gabapentin , Adhesive [tape], Codeine , and Other    Review of Systems  Cardiovascular:  Positive for syncope.    Updated Vital Signs BP 122/75   Pulse (!) 48   Temp 97.8 F (36.6 C) (Oral)   Resp 17   Ht 5' (1.524 m)   Wt 86.2 kg   SpO2 97%   BMI 37.11 kg/m   Physical Exam Vitals and nursing note reviewed.  Constitutional:      Appearance: She is well-developed. She is ill-appearing.     Comments: Ashen in appearance  HENT:     Head: Normocephalic and atraumatic.     Mouth/Throat:     Mouth: Mucous membranes are dry.  Eyes:     Pupils: Pupils are equal, round, and reactive to light.  Cardiovascular:     Rate and Rhythm: Normal rate. Rhythm irregular. Frequent Extrasystoles are present.    Heart sounds: Normal heart sounds. No murmur heard.    No friction rub.     Comments: Prior bilateral mastectomy Pulmonary:     Effort: Pulmonary effort is normal.     Breath sounds: Normal breath sounds. No wheezing or rales.  Abdominal:     General: Bowel sounds are normal. There is no distension.     Palpations: Abdomen is soft.     Tenderness: There is no abdominal tenderness.  There is no guarding or rebound.  Musculoskeletal:        General: No tenderness. Normal range of motion.     Right lower leg: No edema.     Left lower leg: No edema.     Comments: No edema  Skin:    General: Skin is warm and dry.     Coloration: Skin is pale.     Findings: No rash.  Neurological:     Mental Status: She is alert and oriented to person, place, and time. Mental status is at baseline.     Cranial Nerves: No cranial nerve deficit.  Psychiatric:        Behavior: Behavior normal.     (  all labs ordered are listed, but only abnormal results are displayed) Labs Reviewed  I-STAT CHEM 8, ED - Abnormal; Notable for the following components:      Result Value   Potassium 3.1 (*)    Creatinine, Ser 1.30 (*)    Glucose, Bld 119 (*)    Calcium , Ion 1.11 (*)    All other components within normal limits  CBC WITH DIFFERENTIAL/PLATELET  MAGNESIUM   COMPREHENSIVE METABOLIC PANEL WITH GFR  BASIC METABOLIC PANEL WITH GFR  CBC  MAGNESIUM   TSH  TROPONIN I (HIGH SENSITIVITY)    EKG: None ED ECG REPORT   Date: 11/13/2024  Rate: 65  Rhythm: sinus bradycardia and with intermittent episodes of Vtach 6-12 beats  QRS Axis: normal  Intervals: QT prolonged  ST/T Wave abnormalities: nonspecific ST/T changes  Conduction Disutrbances:nonspecific intraventricular conduction delay  Narrative Interpretation:   Old EKG Reviewed: changes noted  I have personally reviewed the EKG tracing and agree with the computerized printout as noted.  Radiology: [facility] Chest Port 1 View Result Date: 11/13/2024 CLINICAL DATA:  History of cardiac arrest presenting with ventricular tachycardia. EXAM: PORTABLE CHEST 1 VIEW COMPARISON:  October 16, 2024 FINDINGS: The cardiac silhouette is mildly enlarged and unchanged in size. There is prominence of the pulmonary vasculature with mild, diffuse, chronic appearing increased interstitial lung markings. Mild atelectasis and/or infiltrate is seen within the  retrocardiac region of the left lung base. No pleural effusion or pneumothorax is identified. No acute osseous abnormalities are identified. IMPRESSION: 1. Mild cardiomegaly with mild pulmonary vascular congestion. 2. Mild left basilar atelectasis and/or infiltrate. Electronically Signed   By: Suzen Dials M.D.   On: 11/13/2024 22:30     Procedures   Medications Ordered in the ED  lidocaine  (XYLOCAINE ) 4 mg/mL bolus via infusion 50 mg (50 mg Intravenous Bolus from Bag 11/13/24 2224)    And  lidocaine  (cardiac) 2000 mg in dextrose  5% 500 mL (4mg /mL) IV infusion (1 mg/min Intravenous New Bag/Given 11/13/24 2221)  isoproterenol  (ISUPREL ) 1 mg in dextrose  5 % 250 mL (0.004 mg/mL) infusion (has no administration in time range)  potassium chloride  10 mEq in 100 mL IVPB (has no administration in time range)  potassium chloride  SA (KLOR-CON  M) CR tablet 40 mEq (has no administration in time range)  acetaminophen  (TYLENOL ) tablet 650 mg (has no administration in time range)  ezetimibe  (ZETIA ) tablet 10 mg (has no administration in time range)  ipratropium (ATROVENT ) 0.06 % nasal spray 2 spray (has no administration in time range)  levothyroxine  (SYNTHROID ) tablet 112 mcg (has no administration in time range)  rosuvastatin  (CRESTOR ) tablet 20 mg (has no administration in time range)  spironolactone  (ALDACTONE ) tablet 25 mg (has no administration in time range)  amiodarone  (NEXTERONE ) 1.8 mg/mL load via infusion 150 mg (150 mg Intravenous Bolus from Bag 11/13/24 2204)                                    Medical Decision Making Amount and/or Complexity of Data Reviewed Independent Historian: EMS External Data Reviewed: notes. Labs: ordered. Decision-making details documented in ED Course. Radiology: ordered and independent interpretation performed. Decision-making details documented in ED Course. ECG/medicine tests: ordered and independent interpretation performed. Decision-making details  documented in ED Course.  Risk Prescription drug management. Decision regarding hospitalization.   Pt with multiple medical problems and comorbidities and presenting today with a complaint that caries a high risk for morbidity  and mortality.  Presenting here today after a syncopal event at home.  And route and after arrival to the emergency room patient is having frequent ectopy of V. tach.  Mostly just 6-9 beats each time returning back to a sinus rhythm however she did have 1 run of 12 seconds with near loss of consciousness.  Suspect this is what happened at home.  Concerned that this may be a catecholamine surge due to patient's grief from the recent passing of her husband within the last 24 hours.  She does have prior history of a similar episode that at the time was thought to be related to her thyroid .  She has stable blood pressure and initially was started on an amnio bolus and drip which did help with the ectopy but after speaking with cardiology due to her prior history of prolonged QT she was changed to lidocaine  and may need some procainamide.  Patient was given Reglan for ongoing nausea.  I independently interpreted patient's EKG which shows a sinus bradycardia without significant changes with an interventricular conduction delay which is similar to prior.  Discussed the case with cardiology who will plan on seeing the patient.  Lower suspicion for PE at this time or infectious etiology. I have independently visualized and interpreted pt's images today.  Chest x-ray with mild vascular congestion but no other acute findings.  No evidence of STEMI at this time. I independently interpreted patient's labs and Chem-8 showed mild hypokalemia of 3.1 with a stable creatinine of 1.3, normal hemoglobin.  CRITICAL CARE Performed by: Kody Brandl Total critical care time: 45 minutes Critical care time was exclusive of separately billable procedures and treating other patients. Critical care was  necessary to treat or prevent imminent or life-threatening deterioration. Critical care was time spent personally by me on the following activities: development of treatment plan with patient and/or surrogate as well as nursing, discussions with consultants, evaluation of patient's response to treatment, examination of patient, obtaining history from patient or surrogate, ordering and performing treatments and interventions, ordering and review of laboratory studies, ordering and review of radiographic studies, pulse oximetry and re-evaluation of patient's condition.      Final diagnoses:  V-tach [facility])    ED Discharge Orders     None          Doretha Folks, MD 11/13/24 2339

## 2024-11-14 ENCOUNTER — Other Ambulatory Visit: Payer: Self-pay

## 2024-11-14 ENCOUNTER — Other Ambulatory Visit (HOSPITAL_COMMUNITY): Payer: Self-pay

## 2024-11-14 ENCOUNTER — Telehealth (HOSPITAL_COMMUNITY): Payer: Self-pay

## 2024-11-14 ENCOUNTER — Inpatient Hospital Stay (HOSPITAL_COMMUNITY)

## 2024-11-14 DIAGNOSIS — I428 Other cardiomyopathies: Secondary | ICD-10-CM | POA: Diagnosis not present

## 2024-11-14 DIAGNOSIS — I472 Ventricular tachycardia, unspecified: Secondary | ICD-10-CM | POA: Diagnosis not present

## 2024-11-14 DIAGNOSIS — I5020 Unspecified systolic (congestive) heart failure: Secondary | ICD-10-CM

## 2024-11-14 DIAGNOSIS — E876 Hypokalemia: Secondary | ICD-10-CM

## 2024-11-14 DIAGNOSIS — E039 Hypothyroidism, unspecified: Secondary | ICD-10-CM

## 2024-11-14 DIAGNOSIS — I469 Cardiac arrest, cause unspecified: Secondary | ICD-10-CM | POA: Diagnosis not present

## 2024-11-14 LAB — MAGNESIUM
Magnesium: 1.5 mg/dL — ABNORMAL LOW (ref 1.7–2.4)
Magnesium: 2.9 mg/dL — ABNORMAL HIGH (ref 1.7–2.4)

## 2024-11-14 LAB — HEPATIC FUNCTION PANEL
ALT: 22 U/L (ref 0–44)
AST: 29 U/L (ref 15–41)
Albumin: 3.5 g/dL (ref 3.5–5.0)
Alkaline Phosphatase: 32 U/L — ABNORMAL LOW (ref 38–126)
Bilirubin, Direct: 0.1 mg/dL (ref 0.0–0.2)
Indirect Bilirubin: 0.6 mg/dL (ref 0.3–0.9)
Total Bilirubin: 0.7 mg/dL (ref 0.0–1.2)
Total Protein: 6.7 g/dL (ref 6.5–8.1)

## 2024-11-14 LAB — CBC
HCT: 35.5 % — ABNORMAL LOW (ref 36.0–46.0)
Hemoglobin: 11.1 g/dL — ABNORMAL LOW (ref 12.0–15.0)
MCH: 29.4 pg (ref 26.0–34.0)
MCHC: 31.3 g/dL (ref 30.0–36.0)
MCV: 94.2 fL (ref 80.0–100.0)
Platelets: 254 K/uL (ref 150–400)
RBC: 3.77 MIL/uL — ABNORMAL LOW (ref 3.87–5.11)
RDW: 13.4 % (ref 11.5–15.5)
WBC: 9.2 K/uL (ref 4.0–10.5)
nRBC: 0 % (ref 0.0–0.2)

## 2024-11-14 LAB — ECHOCARDIOGRAM COMPLETE
AR max vel: 3.37 cm2
AV Peak grad: 6.5 mmHg
Ao pk vel: 1.27 m/s
Area-P 1/2: 3.53 cm2
Height: 60 in
S' Lateral: 3.9 cm
Weight: 3040 [oz_av]

## 2024-11-14 LAB — BASIC METABOLIC PANEL WITH GFR
Anion gap: 13 (ref 5–15)
Anion gap: 10 (ref 5–15)
BUN: 13 mg/dL (ref 8–23)
BUN: 14 mg/dL (ref 8–23)
CO2: 22 mmol/L (ref 22–32)
CO2: 25 mmol/L (ref 22–32)
Calcium: 8.5 mg/dL — ABNORMAL LOW (ref 8.9–10.3)
Calcium: 8.7 mg/dL — ABNORMAL LOW (ref 8.9–10.3)
Chloride: 103 mmol/L (ref 98–111)
Chloride: 100 mmol/L (ref 98–111)
Creatinine, Ser: 1.36 mg/dL — ABNORMAL HIGH (ref 0.44–1.00)
Creatinine, Ser: 1.39 mg/dL — ABNORMAL HIGH (ref 0.44–1.00)
GFR, Estimated: 40 mL/min — ABNORMAL LOW (ref >60)
GFR, Estimated: 39 mL/min — ABNORMAL LOW (ref >60)
Glucose, Bld: 120 mg/dL — ABNORMAL HIGH (ref 70–99)
Glucose, Bld: 99 mg/dL (ref 70–99)
Potassium: 3.4 mmol/L — ABNORMAL LOW (ref 3.5–5.1)
Potassium: 4 mmol/L (ref 3.5–5.1)
Sodium: 138 mmol/L (ref 135–145)
Sodium: 135 mmol/L (ref 135–145)

## 2024-11-14 LAB — TSH: TSH: 31.622 u[IU]/mL — ABNORMAL HIGH (ref 0.350–4.500)

## 2024-11-14 LAB — BRAIN NATRIURETIC PEPTIDE: B Natriuretic Peptide: 968.6 pg/mL — ABNORMAL HIGH (ref 0.0–100.0)

## 2024-11-14 LAB — GLUCOSE, CAPILLARY: Glucose-Capillary: 109 mg/dL — ABNORMAL HIGH (ref 70–99)

## 2024-11-14 LAB — TROPONIN I (HIGH SENSITIVITY): Troponin I (High Sensitivity): 77 ng/L — ABNORMAL HIGH (ref <18)

## 2024-11-14 LAB — T4, FREE: Free T4: 0.6 ng/dL — ABNORMAL LOW (ref 0.61–1.12)

## 2024-11-14 LAB — LACTIC ACID, PLASMA: Lactic Acid, Venous: 1 mmol/L (ref 0.5–1.9)

## 2024-11-14 LAB — MRSA NEXT GEN BY PCR, NASAL: MRSA by PCR Next Gen: NOT DETECTED

## 2024-11-14 LAB — LIDOCAINE LEVEL: Lidocaine Lvl: NOT DETECTED ug/mL (ref 1.5–5.0)

## 2024-11-14 MED ORDER — POTASSIUM CHLORIDE CRYS ER 20 MEQ PO TBCR
40.0000 meq | EXTENDED_RELEASE_TABLET | Freq: Once | ORAL | Status: AC
  Administered 2024-11-14: 40 meq via ORAL
  Filled 2024-11-14: qty 2

## 2024-11-14 MED ORDER — POTASSIUM CHLORIDE 10 MEQ/100ML IV SOLN
10.0000 meq | INTRAVENOUS | Status: AC
  Administered 2024-11-14 (×2): 10 meq via INTRAVENOUS
  Filled 2024-11-14: qty 100

## 2024-11-14 MED ORDER — LEVOTHYROXINE SODIUM 25 MCG PO TABS
125.0000 ug | ORAL_TABLET | Freq: Every day | ORAL | Status: DC
  Administered 2024-11-15 – 2024-11-19 (×5): 125 ug via ORAL
  Filled 2024-11-14 (×5): qty 1

## 2024-11-14 MED ORDER — MAGNESIUM SULFATE 4 GM/100ML IV SOLN
4.0000 g | Freq: Once | INTRAVENOUS | Status: AC
  Administered 2024-11-14: 4 g via INTRAVENOUS
  Filled 2024-11-14: qty 100

## 2024-11-14 MED ORDER — FUROSEMIDE 10 MG/ML IJ SOLN
40.0000 mg | Freq: Once | INTRAMUSCULAR | Status: AC
  Administered 2024-11-14: 40 mg via INTRAVENOUS
  Filled 2024-11-14: qty 4

## 2024-11-14 MED ORDER — LORAZEPAM 0.5 MG PO TABS
0.5000 mg | ORAL_TABLET | Freq: Two times a day (BID) | ORAL | Status: DC | PRN
  Administered 2024-11-14 – 2024-11-18 (×6): 0.5 mg via ORAL
  Filled 2024-11-14 (×7): qty 1

## 2024-11-14 NOTE — Telephone Encounter (Signed)
 Pharmacy Patient Advocate Encounter  Insurance verification completed.    The patient is insured through [employer]. Patient has Medicare and is not eligible for a copay card, but may be able to apply for patient assistance or Medicare RX Payment Plan (Patient Must reach out to their plan, if eligible for payment plan), if available.    Ran test claim for Jardiance 10mg  and the current 30 day co-pay is $47.  Ran test claim for Farxiga 10mg  and the current 30 day co-pay is $282.02.  Ran test claim for Entresto  24-26mg  and the current 30 day co-pay is $47.   This test claim was processed through [facility]- copay amounts may vary at other pharmacies due to [employer], or as the patient moves through the different stages of their insurance plan.

## 2024-11-14 NOTE — Consult Note (Addendum)
 ELECTROPHYSIOLOGY CONSULT NOTE    Patient ID: Kimberly Waters MRN: [id number], DOB/AGE: 1946-03-30 78 y.o.  Admit date: 11/13/2024 Date of Consult: 11/14/2024  Primary Physician: Dwight Trula SQUIBB, MD Primary Cardiologist: Oneil Parchment, MD  Electrophysiologist: Dr. Kennyth   Referring Provider: Dr. Vonda  Patient Profile: Kimberly Waters is a 78 y.o. female with a history of breast cancer status post mastectomy, chemo and radiation, COPD, tobacco use, nonobstructive CAD, prior history of polymorphic VT in the setting of prolonged QTc and hypothyroidism who is being seen today for the evaluation of syncope/cardiac arrest with polymorphic VT at the request of Dr. Celine.  HPI:  Kimberly Waters is a 78 y.o. female with above noted medical/cardiac history. Patient was brought in with EMS after witnessed cardiac arrest at home. Sadly patient found her husband deceased in their basement 2 days ago and since, patient had noted frequent palpitations. Per family, had not been eating and drinking well. Yesterday she had witnessed syncope with family noting detecting both fast and slow HR when checking for pulses before pulses finally lost. CPR was started and EMS activated. Per ED notes, patient alert but confused when the first fire personnel arrived on scene. By the time [facility] arrived, she was alert and oriented. En route to the ED, EMS reports seeing bigeminy, trigeminy, and V tach (no run sheet available). In the ED, patient reported to have episodes of sustained polymorphic VT with R on T mixed with sinus bradycardia. 12 lead ECG with very long QTC. She received Amiodarone  but was quickly transitioned to Lidocaine  2/2 long QT. Patient also started on Isoproterenol . Initial labs found K 3.1, calcium  1.1, Mg 1.7, TSH 31.622, BNP 968.6, Troponin I 81->77.  Patient has previously been seen by our EP team after prior Torsades de pointes in 2024. Previous LHC without obstructive CAD. cMRI without LGE or  myocarditis. Ultimately it was felt that this arrhythmia had occurred secondary to significant hypothyroidism and upon receiving replacement, patient had improvement in QT intervals and no further VT. She was discharged with a LifeVest but ICD not recommended given reversible cause.   This morning, she denies chest pain, palpitations, dyspnea, PND, orthopnea, nausea, vomiting, dizziness, syncope. Confirms episodic dizziness like I was going out yesterday prior to her cardiac arrest. When asked about thyroid  replacement, patient says that she has been taking daily, though does not take as prescribed. Instead of taking in the morning on an empty stomach, she takes in the middle of the night.    Labs Potassium3.4* (11/21 0216) Magnesium   1.5* (11/21 0216) Creatinine, ser  1.36* (11/21 0216) PLT  254 (11/21 0216) HGB  11.1* (11/21 0216) WBC 9.2 (11/21 0216) Troponin I (High Sensitivity)77* (11/21 0216).    Past Medical History:  Diagnosis Date   Allergy    Breast cancer (HCC)    Colon polyps    Depression    Dyspnea    SEASONAL   Family history of breast cancer    Family history of colon cancer    GERD (gastroesophageal reflux disease)    History of radiation therapy 06/05/17-07/20/17   right chest wall 50.4 Gy in 28 fractions, right axillary nodal region 45 Gy in 25 fractions   Hyperlipidemia    Hypertension    Hypertensive retinopathy    Hypothyroidism    Macular degeneration    Peripheral neuropathy 06/18/2019   PONV (postoperative nausea and vomiting)      Surgical History:  Past Surgical History:  Procedure  Laterality Date   ABDOMINAL HYSTERECTOMY     CATARACT EXTRACTION, BILATERAL Bilateral    Dr. [facility]   KNEE SURGERY Right    LEFT HEART CATH AND CORONARY ANGIOGRAPHY N/A 02/02/2023   Procedure: LEFT HEART CATH AND CORONARY ANGIOGRAPHY;  Surgeon: Burnard Debby LABOR, MD;  Location: [facility] INVASIVE CV LAB;  Service: Cardiovascular;  Laterality: N/A;    MASTECTOMY W/ SENTINEL NODE BIOPSY Right 03/05/2017   Procedure: BILATERAL TOTAL MASTECTOMIES WITH RIGHT SENTINEL LYMPH NODE BIOPSIES;  Surgeon: Morene Olives, MD;  Location: [facility] OR;  Service: General;  Laterality: Right;   SHOULDER SURGERY     BILAT     Medications Prior to Admission  Medication Sig Dispense Refill Last Dose/Taking   albuterol  (VENTOLIN  HFA) 108 (90 Base) MCG/ACT inhaler Inhale 2 puffs into the lungs every 4 (four) hours as needed.   Taking As Needed   carvedilol  (COREG ) 3.125 MG tablet Take 3.125 mg by mouth 2 (two) times daily.   Taking   valsartan  (DIOVAN ) 80 MG tablet 1 tablet Orally Once a day; Duration: 90 days   Taking   acetaminophen  (TYLENOL ) 325 MG tablet Take 2 tablets (650 mg total) by mouth every 4 (four) hours as needed for headache or mild pain.      albuterol  (PROVENTIL ,VENTOLIN ) 90 MCG/ACT inhaler Inhale 2 puffs into the lungs every 4 (four) hours as needed. (Patient taking differently: Inhale 2 puffs into the lungs every 4 (four) hours as needed for wheezing or shortness of breath.) 17 g 3    arformoterol  (BROVANA ) 15 MCG/2ML NEBU Take 2 mLs (15 mcg total) by nebulization 2 (two) times daily. 120 mL 6    betamethasone  dipropionate 0.05 % cream 1 application      budesonide -formoterol  (SYMBICORT ) 80-4.5 MCG/ACT inhaler 2 puffs Inhalation Twice a day; Duration: 30 days      buPROPion  (WELLBUTRIN  SR) 150 MG 12 hr tablet Take 150 mg by mouth daily.       clobetasol  ointment (TEMOVATE ) 0.05 % as needed.      escitalopram  (LEXAPRO ) 10 MG tablet Take 1 tablet (10 mg total) by mouth daily. 30 tablet 0    ezetimibe  (ZETIA ) 10 MG tablet Take 1 tablet (10 mg total) by mouth daily. 90 tablet 3    furosemide  (LASIX ) 40 MG tablet Take 1 tablet (40 mg total) by mouth daily as needed.      ipratropium (ATROVENT ) 0.06 % nasal spray Place 2 sprays into the nose 4 (four) times daily. 15 mL 3    ketoconazole  (NIZORAL ) 2 % cream Apply 1 application topically daily. 30 g 6     levocetirizine (XYZAL ) 5 MG tablet Take 5 mg by mouth as needed for allergies.      levothyroxine  (SYNTHROID ) 112 MCG tablet Take 112 mcg by mouth daily before breakfast.      melatonin 5 MG TABS Take 1 tablet (5 mg total) by mouth at bedtime.  0    meloxicam (MOBIC) 7.5 MG tablet Take 7.5 mg by mouth daily.      mometasone  (ELOCON ) 0.1 % ointment Apply topically as needed.      nitroGLYCERIN  (NITROSTAT ) 0.4 MG SL tablet Place 0.4 mg under the tongue every 5 (five) minutes as needed for chest pain.      omeprazole  (PRILOSEC) 20 MG capsule Take 20 mg by mouth daily.      potassium chloride  SA (KLOR-CON  M) 20 MEQ tablet TAKE 2 TABLETS BY MOUTH DAILY (Patient not taking: Reported on 10/20/2024) 180  tablet 0    revefenacin  (YUPELRI ) 175 MCG/3ML nebulizer solution Take 3 mLs (175 mcg total) by nebulization daily. 90 mL 5    rosuvastatin  (CRESTOR ) 20 MG tablet Take 1 tablet (20 mg total) by mouth daily. 90 tablet 3    spironolactone  (ALDACTONE ) 25 MG tablet Take 1 tablet (25 mg total) by mouth daily. 90 tablet 1    tacrolimus (PROTOPIC) 0.1 % ointment Apply topically as needed.       Inpatient Medications:   Chlorhexidine  Gluconate Cloth  6 each Topical Daily   ezetimibe   10 mg Oral Daily   ipratropium  2 spray Nasal QID   levothyroxine   100 mcg Oral Q0600   rosuvastatin   20 mg Oral Daily   spironolactone   25 mg Oral Daily    Allergies:  Allergies  Allergen Reactions   Latex Other (See Comments)    Other reaction(s): Other (See Comments) Tears skin.   Amlodipine  Other (See Comments)    Peripheral neuropathy   Gabapentin      Memory loss and made her crazy per daughter at the bedside   Adhesive [Tape] Other (See Comments)    (skin tears)  Tolerates PAPER TAPE   Codeine  Nausea Only   Other Nausea Only    Anesthesia--nausea     Family History  Problem Relation Age of Onset   Heart disease Mother 98       stents   Colon cancer Maternal Grandfather 85   Colon cancer Maternal  Aunt 28   Colon cancer Maternal Uncle        dx in his 63s   Breast cancer Paternal Aunt 39   Breast cancer Cousin 63       paternal first cousin   Lung cancer Paternal Grandfather    Colon cancer Maternal Uncle    Head & neck cancer Maternal Uncle        oral cancer     Physical Exam: Vitals:   11/14/24 0445 11/14/24 0500 11/14/24 0515 11/14/24 0530  BP: 106/84 108/81 (!) 76/55 105/61  Pulse: 67 74 70 65  Resp: 20 17 18 18   Temp:      TempSrc:      SpO2: 97% (!) 89% (!) 86% 97%  Weight:      Height:        GEN- NAD, A&O x 3, normal affect HEENT: Normocephalic, atraumatic Lungs- CTAB, Normal effort.  Heart- Regular rate and rhythm, No M/G/R.  GI- Soft, NT, ND.  Extremities- No clubbing, cyanosis, or edema   Radiology/Studies: DG Chest Port 1 View Result Date: 11/13/2024 CLINICAL DATA:  History of cardiac arrest presenting with ventricular tachycardia. EXAM: PORTABLE CHEST 1 VIEW COMPARISON:  October 16, 2024 FINDINGS: The cardiac silhouette is mildly enlarged and unchanged in size. There is prominence of the pulmonary vasculature with mild, diffuse, chronic appearing increased interstitial lung markings. Mild atelectasis and/or infiltrate is seen within the retrocardiac region of the left lung base. No pleural effusion or pneumothorax is identified. No acute osseous abnormalities are identified. IMPRESSION: 1. Mild cardiomegaly with mild pulmonary vascular congestion. 2. Mild left basilar atelectasis and/or infiltrate. Electronically Signed   By: Suzen Dials M.D.   On: 11/13/2024 22:30   Intravitreal Injection, Pharmacologic Agent - OD - Right Eye Result Date: 10/31/2024 Time Out 10/31/2024. 12:33 PM. Confirmed correct patient, procedure, site, and patient consented. Anesthesia Topical anesthesia was used. Anesthetic medications included Lidocaine  2%, Proparacaine 0.5%. Procedure Preparation included 5% betadine to ocular surface, eyelid speculum. A supplied (  32g) needle was  used. Injection: 6 mg faricimab -svoa 6 MG/0.05ML   Route: Intravitreal, Site: Right Eye   NDC: 49757-903-98, Lot: [id number], Expiration date: 09/23/2025, Waste: 0 mL Post-op Post injection exam found visual acuity of at least counting fingers. The patient tolerated the procedure well. There were no complications. The patient received written and verbal post procedure care education. Post injection medications were not given.   OCT, Retina - OU - Both Eyes Result Date: 10/31/2024 Right Eye Quality was good. Central Foveal Thickness: 276. Progression has worsened. Findings include no IRF, no SRF, abnormal foveal contour, subretinal hyper-reflective material, choroidal neovascular membrane, intraretinal fluid, pigment epithelial detachment, subretinal fluid (persistent IRF / cystic changes overlying central SRHM/CNV--slightly increased). Left Eye Quality was good. Central Foveal Thickness: 303. Progression has been stable. Findings include no IRF, no SRF, abnormal foveal contour, subretinal hyper-reflective material, lamellar hole, outer retinal atrophy (large central disciform scar with ORA, foveal notch, trace cystic changes and temporal edema -- all stable). Notes *Images captured and stored on drive Diagnosis / Impression: exu ARMD OU OD: persistent IRF / cystic changes overlying central SRHM/CNV--slightly increased OS: large central disciform scar with ORA, Foveal notch, trace cystic changes and temporal edema -- all stable Clinical management: See below Abbreviations: NFP - Normal foveal profile. CME - cystoid macular edema. PED - pigment epithelial detachment. IRF - intraretinal fluid. SRF - subretinal fluid. EZ - ellipsoid zone. ERM - epiretinal membrane. ORA - outer retinal atrophy. ORT - outer retinal tubulation. SRHM - subretinal hyper-reflective material. IRHM - intraretinal hyper-reflective material   DG Chest 2 View Result Date: 10/16/2024 CLINICAL DATA:  Chest pain, hypertension for 1 week EXAM:  CHEST - 2 VIEW COMPARISON:  02/01/2023 FINDINGS: Frontal and lateral views of the chest demonstrate an unremarkable cardiac silhouette. No acute airspace disease, effusion, or pneumothorax. No acute bony abnormalities. IMPRESSION: 1. No acute intrathoracic process. Electronically Signed   By: Ozell Daring M.D.   On: 10/16/2024 18:48   CT Head Wo Contrast Result Date: 10/16/2024 CLINICAL DATA:  Worsening headache 1 week. Nausea, chest pain and shortness of breath 3 days. EXAM: CT HEAD WITHOUT CONTRAST TECHNIQUE: Contiguous axial images were obtained from the base of the skull through the vertex without intravenous contrast. RADIATION DOSE REDUCTION: This exam was performed according to the departmental dose-optimization program which includes automated exposure control, adjustment of the mA and/or kV according to patient size and/or use of iterative reconstruction technique. COMPARISON:  02/01/2023 FINDINGS: Brain: Ventricles, cisterns and other CSF spaces are normal. There is no mass, mass effect, shift of midline structures or acute hemorrhage. No evidence of acute infarction. Mild chronic ischemic microvascular disease is present. Vascular: No hyperdense vessel or unexpected calcification. Skull: Normal. Negative for fracture or focal lesion. Sinuses/Orbits: No acute finding. Other: None. IMPRESSION: 1. No acute findings. 2. Mild chronic ischemic microvascular disease. Electronically Signed   By: Toribio Agreste M.D.   On: 10/16/2024 18:31    EKG: Initial ED ECG with polymorphic VT. Subsequent tracings show sinus bradycardia with PVCs (personally reviewed)    TELEMETRY: ED telemetry unavailable. CVICU shows sinus bradycardia, initially with frequent NSVT, PVCs (at least 2 different morphologies). Now telemetry quiescent, sinus bradycardia (personally reviewed)  DEVICE HISTORY: N/A  Assessment/Plan:  Polymorphic Ventricular Tachycardia Syncope Cardiac arrest As above, patient with witnessed  syncope and reported loss of pulses per family. Received ~2 min of CPR. With EMS and in the ED, noted to have polymorphic VT mixed with sinus bradycardia. Initially received  IV Amiodarone , switched to Lidocaine  with Isoprel added. She continues with Lidocaine  this morning, Isoprel now off. Telemetry now with sinus bradycardia, rare PVCs. Labs notable for hypomagnesemia, significantly elevated TSH. Magnesium  has been replaced.  Patient now with second documented instance of polymorphic VT in setting of profound hypothyroidism. FT4 and T3 pending. At this time, will continue Lidocaine , check lidocaine  level at midnight.  Maintain Mg>2, K>4. She will need further workup/management of hypothyroidism. Previously it was felt that her VT was secondary to long QT in the setting of hypothyroidism. EP team will continue discussions about indications for secondary prevention ICD.  TTE pending Have engaged PCCM and AHF teams to assist in managing this patient. EP will remain onboard as a consult service.   HFmrEF LVEF on 2024 cMRI ~40%. Patient without recent symptoms suggestive of HF exacerbation and appears euvolemic on exam today. BNP elevated to 968.6. TTE pending.   Hypothyroidism Patient with TSH >30 this admission despite reported compliance with daily replacement. As above, FT4 and T3 pending. Will defer further workup and management to PCCM.      For questions or updates, please contact [facility] Please consult [email or web address] for contact info under     Signed, Artist Pouch, PA-C  11/14/2024, 7:24 AM     I have seen, examined the patient, and reviewed the above assessment and plan.    HPI: Kimberly Waters is a 78 y.o. female with a history of breast cancer status post mastectomy, chemo and radiation, COPD, tobacco use, nonobstructive CAD, prior history of polymorphic VT in the setting of prolonged QTc and hypothyroidism who presented to the ED via EMS following a witnessed cardiac  arrest at home.  Patient recently found her husband deceased in their basement.  Since then, she has not been eating and drinking well and noted frequent palpitations.  Yesterday she had witnessed syncope with family noting detecting both fast and slow HR when checking for pulses before pulses finally lost. CPR was started and EMS activated.  She was brought to the ED.  EMS in the ED noted episodes of PMVT.  EP has been consulted for assistance in management.  General: Well developed, in no acute distress.  Neck: No JVD.  Cardiac: Bradycardic, regular rhythm.  Resp: Normal work of breathing.  Ext: No edema.  Neuro: No gross focal deficits.  Psych: Normal affect.   Cardiac MRI 02/08/23: IMPRESSION: 1. Mildly reduced LV systolic function with dyssynchronous septal motion due to conduction delay. Normal LV chamber size. EF 41%, visually 40-45%.   2.  No delayed myocardial enhancement or myocardial edema.   3.  Visually normal right ventricular chamber size and function.  Assessment: Ms. Boyko is a 78 year old female who presented after a witnessed cardiac arrest, requiring approximately 2 minutes of CPR.  With EMS and in the ED she was noted to have recurrent runs of PMVT.  Interestingly, she has had a similar presentation previously which had been attributed to prolonged QT in the setting of hypothyroidism and electrolyte derangements.  Again, she presents with prolonged QT and remains hypothyroid with hypokalemia, over a year removed from her prior episode.  The concern is that her precipitating factors may not be easily preventable.  Additionally, she was noted to have low LVEF on MRI in 2024.  If LVEF remains low, then I think going ahead and pursuing ICD for prevention is further supported.  Problem List:  PMVT Cardiac Arrest Syncope  HFmrEF Hypothyroidism Hypokalemia  Plan: - Patient will  likely necessitate secondary prevention ICD implant prior to discharge unless any easily  correctable etiologies can be identified.  Timing will depend on overall clinical course. -Continue lidocaine , check lidocaine  levels daily.  Once electrolytes have been corrected, can trial weaning. - Repeat echocardiogram. - Appreciate PCCM assistance and electrolyte management and correction of her hypothyroidism.  Fonda Kitty, MD, [facility], [facility] Cardiac Electrophysiology

## 2024-11-14 NOTE — Progress Notes (Addendum)
 Advanced Heart Failure Rounding Note  Cardiologist: Oneil Parchment, MD  Chief Complaint: PMVT w/ Syncope OOH Cardiac Arrest   Patient Profile/HPI    Kimberly Waters is a 78 y.o. female with history of breast cancer status post mastectomy, chemo and radiation, COPD, tobacco use, nonobstructive CAD, systolic HF (EF 40-45% on cMRI), prior history of polymorphic VT in the setting of prolonged QTc and hypothyroidism who is being seen 11/13/2024 for the evaluation of syncope and polymorphic VT.   Witnessed syncope and reported loss of pulses per family. Received ~2 min of CPR   On arrival to the ED she was having episodes of sustained polymorphic VT with R on T.  QTc prolonged at 471. Initially given IV amio but transitioned to lidocaine  gtt.    Initial K 3.0, Mg 1.5   Hs trop 81>>77  TSH 31.6, Free T4 0.6   BNP 968 LA 1.0   Subjective:    Continues on lidocaine  gtt @ 1 mg/min. No further VT.   Repeat K 4.0, Mg 2.9 after supplementation   Feels better. Slept well last night. Denies CP. No dyspnea.   Echo completed, interpretation pending.   Objective:   Weight Range: 86.2 kg Body mass index is 37.11 kg/m.   Vital Signs:   Temp:  [97 F (36.1 C)-97.8 F (36.6 C)] 97 F (36.1 C) (11/21 0700) Pulse Rate:  [28-74] 53 (11/21 1000) Resp:  [12-26] 16 (11/21 1000) BP: (76-190)/(48-163) 111/101 (11/21 1000) SpO2:  [86 %-100 %] 98 % (11/21 1000) Weight:  [86.2 kg] 86.2 kg (11/20 2155) Last BM Date : 11/14/24  Weight change: Filed Weights   11/13/24 2155  Weight: 86.2 kg    Intake/Output:   Intake/Output Summary (Last 24 hours) at 11/14/2024 1044 Last data filed at 11/14/2024 0500 Gross per 24 hour  Intake 683.34 ml  Output --  Net 683.34 ml      Physical Exam   GENERAL: NAD Lungs- clear  CARDIAC:  JVP: 10 cm          Normal rate with regular rhythm. No MRG. No LEE  ABDOMEN: Soft, non-tender, non-distended.  EXTREMITIES: Warm and well perfused.  NEUROLOGIC: No  obvious FND   Telemetry   NSR upper 50s, no further VT  EKG    SB 51 bpm, QTC 585 ms   Labs    CBC Recent Labs    11/13/24 2143 11/13/24 2210 11/14/24 0216  WBC 10.9*  --  9.2  NEUTROABS 8.1*  --   --   HGB 12.2 12.6 11.1*  HCT 38.0 37.0 35.5*  MCV 94.1  --  94.2  PLT 325  --  254   Basic Metabolic Panel Recent Labs    88/78/74 0216 11/14/24 0842  NA 138 135  K 3.4* 4.0  CL 103 100  CO2 22 25  GLUCOSE 120* 99  BUN 13 14  CREATININE 1.36* 1.39*  CALCIUM  8.5* 8.7*  MG 1.5* 2.9*   Liver Function Tests Recent Labs    11/13/24 2143  AST 34  ALT 24  ALKPHOS 35*  BILITOT 0.4  PROT 6.6  ALBUMIN 3.6   No results for input(s): LIPASE, AMYLASE in the last 72 hours. Cardiac Enzymes No results for input(s): CKTOTAL, CKMB, CKMBINDEX, TROPONINI in the last 72 hours.  BNP: BNP (last 3 results) Recent Labs    11/14/24 0216  BNP 968.6*    ProBNP (last 3 results) Recent Labs    10/16/24 1818  PROBNP 847.0*  D-Dimer No results for input(s): DDIMER in the last 72 hours. Hemoglobin A1C No results for input(s): HGBA1C in the last 72 hours. Fasting Lipid Panel No results for input(s): CHOL, HDL, LDLCALC, TRIG, CHOLHDL, LDLDIRECT in the last 72 hours. Thyroid  Function Tests Recent Labs    11/13/24 2143  TSH 31.622*    Other results:   Imaging    DG Chest Port 1 View Result Date: 11/13/2024 CLINICAL DATA:  History of cardiac arrest presenting with ventricular tachycardia. EXAM: PORTABLE CHEST 1 VIEW COMPARISON:  October 16, 2024 FINDINGS: The cardiac silhouette is mildly enlarged and unchanged in size. There is prominence of the pulmonary vasculature with mild, diffuse, chronic appearing increased interstitial lung markings. Mild atelectasis and/or infiltrate is seen within the retrocardiac region of the left lung base. No pleural effusion or pneumothorax is identified. No acute osseous abnormalities are identified.  IMPRESSION: 1. Mild cardiomegaly with mild pulmonary vascular congestion. 2. Mild left basilar atelectasis and/or infiltrate. Electronically Signed   By: Suzen Dials M.D.   On: 11/13/2024 22:30     Medications:     Scheduled Medications:  Chlorhexidine  Gluconate Cloth  6 each Topical Daily   ezetimibe   10 mg Oral Daily   ipratropium  2 spray Nasal QID   levothyroxine   100 mcg Oral Q0600   rosuvastatin   20 mg Oral Daily   spironolactone   25 mg Oral Daily    Infusions:  lidocaine  1 mg/min (11/14/24 0500)    PRN Medications: acetaminophen , LORazepam , mouth rinse      Assessment/Plan   1. OOH Arrest D/t PMVT - reported loss of pulse in the field w/ 2 min of CPR - sustained polymorphic VT with R on T on arrival.  QTc prolonged at 471. K 3.0, Mg 1.5 - now on lidocaine  gtt at 1 mg/min , K and Mg both appropriately corrected - no further VT - EP following, suspect will need ICD prior to d/c  - Echo pending  - no indication for LHC. HS trop 81>>77. Cath in 2024 showed no CAD  - cMRI 2024 w/ no LGE. Normal ECV% and T2 values. EF 40-45%, RV nl   2. Hypokalemia/Hypomagnesemia - corrected - keep K > 4.0 and Mg > 2.0   3. Hypothyroidism - TSH 31.6, Free T4 0.60, Free T3 pending  - on levothyroxine  100 mcg PTA but per pt report, she may not have been taking it consistently  - will need up-titration of dose, will d/w MD   4. Chronic Systolic Heart Failure - cMRI 7975 EF 40-45%, RV ok - BNP 968 on admission, CXR w/ mild pulmonary vascular congestion  - give 40 mg IV Lasix  w/ additional K supp  - continue spironolactone  25 mg  - Echo completed, interpretation pending   CRITICAL CARE Performed by: Caffie Shed   Total critical care time: 15 minutes  Critical care time was exclusive of separately billable procedures and treating other patients.  Critical care was necessary to treat or prevent imminent or life-threatening deterioration.  Critical care was time  spent personally by me on the following activities: development of treatment plan with patient and/or surrogate as well as nursing, discussions with consultants, evaluation of patient's response to treatment, examination of patient, obtaining history from patient or surrogate, ordering and performing treatments and interventions, ordering and review of laboratory studies, ordering and review of radiographic studies, pulse oximetry and re-evaluation of patient's condition.   Length of Stay: 1  Brittainy Simmons, PA-C  11/14/2024, 10:44 AM  Advanced Heart Failure Team Pager [phone] (M-F; 7a - 5p)  Please contact [facility] for night-coverage after hours (5p -7a ) and weekends on [email or web address]  Patient seen with PA, I formulated the plan and agree with the above note.   Patient had syncope at home, had 2 minutes CPR and regained consciousness. Episodes of PMVT noted in ER, noted to have long QT interval.  Started on isoproterenol  and lidocaine , now just on lidocaine .     Echo today showed EF 30-35%, normal RV.  Prior cMRI in 2/24 with EF 41%, no LGE.   Of note, levothyroxine  was decreased recently and today, TSH 31 with low free T4.  Patient had prior episode of PMVT associated with long QT thought to be triggered by hypothyroidism.   Patient has been off Lasix  for about 3 wks (stopped by PCP).   General: NAD Neck: JVP 8-9 cm, no thyromegaly or thyroid  nodule.  Lungs: Clear to auscultation bilaterally with normal respiratory effort. CV: Nondisplaced PMI.  Heart regular S1/S2, no S3/S4, no murmur.  Trace ankle edema.  No carotid bruit.  Normal pedal pulses.  Abdomen: Soft, nontender, no hepatosplenomegaly, no distention.  Skin: Intact without lesions or rashes.  Neurologic: Alert and oriented x 3.  Psych: Normal affect. Extremities: No clubbing or cyanosis.  HEENT: Normal.   1. Polymorphic VT with syncope: In setting of long QT.  HS-TnI mildly elevated with no trend, doubt ACS.  Cath  in 2024 with no significant CAD. PMVT in past, trigger thought to be QT prolongation from hypothyroidism.  Recently had levothyroxine  dose decreased, TSH high and free T4 low. When euthyroid, QT does not appear to be prolonged.  - Correct K and Mg.  - Increase levothyroxine  to 125 mcg daily.  - Continue lidocaine  for now.  - EP following, would favor ICD at this point as PMVT with arrest has been recurrent and she appears to be at risk with any fluctuation in her thyroid  status.  2. Acute on chronic systolic CHF: Nonischemic cardiomyopathy, cath in 2024 with no CAD.  Cardiac MRI in 2/24 with EF 41%, normal RV.  Echo this admission with lower EF 30-35%, normal RV.  She is mildly volume overloaded on exam, has been off Lasix  x 3 wks.  - Would give Lasix  40 mg IV x 1 and follow.  Will need to replace K.  - Continue spironolactone  25 mg daily. 3. Hypothyroidism: Levothyroxine  recently decreased by PCP, TSH now high with low T4.  Hypothyroidism thought to be trigger for PMVT in the past, may be trigger this time as well.  - Increase levothyroxine  to 125 mcg daily.   CRITICAL CARE Performed by: Ezra Shuck  Total critical care time: 70 minutes  Critical care time was exclusive of separately billable procedures and treating other patients.  Critical care was necessary to treat or prevent imminent or life-threatening deterioration.  Critical care was time spent personally by me on the following activities: development of treatment plan with patient and/or surrogate as well as nursing, discussions with consultants, evaluation of patient's response to treatment, examination of patient, obtaining history from patient or surrogate, ordering and performing treatments and interventions, ordering and review of laboratory studies, ordering and review of radiographic studies, pulse oximetry and re-evaluation of patient's condition.  Ezra Shuck 11/14/2024 1:49 PM

## 2024-11-14 NOTE — Progress Notes (Signed)
 Echocardiogram 2D Echocardiogram has been performed.  Kimberly Waters 11/14/2024, 10:41 AM

## 2024-11-14 NOTE — TOC Initial Note (Signed)
 Transition of Care [facility]) - Initial/Assessment Note    Patient Details  Name: Kimberly Waters MRN: [id number] Date of Birth: 1946/01/06  Transition of Care [facility]) CM/SW Contact:    Justina Delcia Czar, RN Phone Number: [phone] 11/14/2024, 7:07 PM  Clinical Narrative:                  Spoke to pt at bedside. States she was home when her recently passed away. States she will go to her daughter's home after dc. She will eventually go to ALF. Husband was her caregiver in the home. Gave permission to speak to dtr or son, Chad.   Will continue to follow for dc needs.    Expected Discharge Plan: Home w Home Health Services Barriers to Discharge: Continued Medical Work up   Patient Goals and CMS Choice Patient states their goals for this hospitalization and ongoing recovery are:: wants to remain independent      Expected Discharge Plan and Services   Discharge Planning Services: CM Consult   Living arrangements for the past 2 months: Single Family Home                    Prior Living Arrangements/Services Living arrangements for the past 2 months: Single Family Home Lives with:: Self Patient language and need for interpreter reviewed:: Yes Do you feel safe going back to the place where you live?: Yes      Need for Family Participation in Patient Care: Yes (Comment)   Current home services: DME (walker, wheelchair) Criminal Activity/Legal Involvement Pertinent to Current Situation/Hospitalization: No - Comment as needed  Activities of Daily Living      Permission Sought/Granted Permission sought to share information with : Family Supports, Case Manager, PCP Permission granted to share information with : Yes, Verbal Permission Granted  Share Information with NAME: [name] Hargett  Permission granted to share info w AGENCY: Home Health, DME, PCP  Permission granted to share info w Relationship: daughter  Permission granted to share info w Contact Information:  [phone]  Emotional Assessment Appearance:: Appears stated age Attitude/Demeanor/Rapport: Engaged Affect (typically observed): Accepting Orientation: : Oriented to Self, Oriented to Place, Oriented to  Time, Oriented to Situation   Psych Involvement: No (comment)  Admission diagnosis:  Ventricular tachycardia (HCC) [I47.20] V-tach [facility]) [I47.20] Patient Active Problem List   Diagnosis Date Noted   Long QT interval syndrome 03/14/2023   Syncope 03/14/2023   Torsades de pointes (HCC) 02/03/2023   Ventricular tachycardia (HCC) 02/01/2023   Transient loss of consciousness 01/23/2023   Stage 3b chronic kidney disease (CKD) (HCC) 01/23/2023   Coronary artery disease involving native coronary artery of native heart without angina pectoris 03/08/2022   Left bundle branch block 03/08/2022   Physical deconditioning 06/09/2021   Aortic atherosclerosis 05/27/2020   COPD (chronic obstructive pulmonary disease) (HCC) 05/27/2020   Pre-diabetes 07/25/2019   Metabolic syndrome 07/25/2019   Neuropathy 07/25/2019   MGUS (monoclonal gammopathy of unknown significance) 06/26/2019   Peripheral neuropathy 06/18/2019   Chronic diastolic CHF (congestive heart failure) (HCC) 05/28/2018   Acute exacerbation of CHF (congestive heart failure) (HCC) 04/01/2018   Dyspnea on exertion 03/28/2018   Genetic testing 03/14/2017   Colon polyps    Family history of colon cancer    Family history of breast cancer    Malignant neoplasm of upper-outer quadrant of right breast in female, estrogen receptor positive (HCC) 02/06/2017   Hypotension 12/31/2012   Asthma exacerbation 12/30/2012   Dehydration 12/30/2012  Viral gastroenteritis 12/30/2012   Vitamin D  deficiency 04/16/2012   Asthma 04/24/2007   HYPOTHYROIDISM, UNSPECIFIED 02/21/2007   HYPERTRIGLYCERIDEMIA 02/21/2007   Hyperlipidemia 02/21/2007   DEPRESSION, MAJOR, RECURRENT 02/21/2007   TOBACCO DEPENDENCE 02/21/2007   HYPERTENSION, BENIGN SYSTEMIC  02/21/2007   SINUSITIS, CHRONIC, NOS 02/21/2007   RHINITIS, ALLERGIC 02/21/2007   DIVERTICULOSIS OF COLON 02/21/2007   HEARTBURN 02/21/2007   INCONTINENCE, URGE 02/21/2007   PCP:  Dwight Trula SQUIBB, MD Pharmacy:   [id number] - [place], [place] - [address] [place] [address] Phone: [phone] Fax: [phone]     Social Drivers of Health (SDOH) Social History: SDOH Screenings   Food Insecurity: Patient Declined (02/01/2023)  Transportation Needs: Patient Declined (02/01/2023)  Utilities: Patient Declined (02/01/2023)  Tobacco Use: Medium Risk (11/13/2024)   SDOH Interventions:     Readmission Risk Interventions     No data to display

## 2024-11-14 NOTE — Progress Notes (Signed)
 Heart Failure Navigator Progress Note  Assessed for Heart & Vascular TOC clinic readiness.  Patient does not meet criteria due to the Advanced Heart Failure Team was consulted. .   Navigator will sign off at this time.   Stephane Haddock, BSN, Scientist, Clinical (histocompatibility And Immunogenetics) Only

## 2024-11-15 DIAGNOSIS — I472 Ventricular tachycardia, unspecified: Secondary | ICD-10-CM | POA: Diagnosis not present

## 2024-11-15 DIAGNOSIS — I10 Essential (primary) hypertension: Secondary | ICD-10-CM

## 2024-11-15 DIAGNOSIS — I5022 Chronic systolic (congestive) heart failure: Secondary | ICD-10-CM

## 2024-11-15 DIAGNOSIS — E039 Hypothyroidism, unspecified: Secondary | ICD-10-CM | POA: Diagnosis not present

## 2024-11-15 DIAGNOSIS — I469 Cardiac arrest, cause unspecified: Secondary | ICD-10-CM | POA: Diagnosis not present

## 2024-11-15 LAB — COMPREHENSIVE METABOLIC PANEL WITH GFR
ALT: 19 U/L (ref 0–44)
AST: 21 U/L (ref 15–41)
Albumin: 3.3 g/dL — ABNORMAL LOW (ref 3.5–5.0)
Alkaline Phosphatase: 34 U/L — ABNORMAL LOW (ref 38–126)
Anion gap: 10 (ref 5–15)
BUN: 18 mg/dL (ref 8–23)
CO2: 26 mmol/L (ref 22–32)
Calcium: 8.5 mg/dL — ABNORMAL LOW (ref 8.9–10.3)
Chloride: 102 mmol/L (ref 98–111)
Creatinine, Ser: 1.5 mg/dL — ABNORMAL HIGH (ref 0.44–1.00)
GFR, Estimated: 36 mL/min — ABNORMAL LOW (ref >60)
Glucose, Bld: 99 mg/dL (ref 70–99)
Potassium: 4.2 mmol/L (ref 3.5–5.1)
Sodium: 138 mmol/L (ref 135–145)
Total Bilirubin: 0.5 mg/dL (ref 0.0–1.2)
Total Protein: 6.4 g/dL — ABNORMAL LOW (ref 6.5–8.1)

## 2024-11-15 LAB — CBC
HCT: 34.5 % — ABNORMAL LOW (ref 36.0–46.0)
Hemoglobin: 11 g/dL — ABNORMAL LOW (ref 12.0–15.0)
MCH: 30.7 pg (ref 26.0–34.0)
MCHC: 31.9 g/dL (ref 30.0–36.0)
MCV: 96.4 fL (ref 80.0–100.0)
Platelets: 254 K/uL (ref 150–400)
RBC: 3.58 MIL/uL — ABNORMAL LOW (ref 3.87–5.11)
RDW: 13.5 % (ref 11.5–15.5)
WBC: 9.3 K/uL (ref 4.0–10.5)
nRBC: 0 % (ref 0.0–0.2)

## 2024-11-15 LAB — MAGNESIUM: Magnesium: 2.2 mg/dL (ref 1.7–2.4)

## 2024-11-15 MED ORDER — HYDRALAZINE HCL 20 MG/ML IJ SOLN
10.0000 mg | Freq: Four times a day (QID) | INTRAMUSCULAR | Status: DC | PRN
  Administered 2024-11-15 – 2024-11-18 (×6): 10 mg via INTRAVENOUS
  Filled 2024-11-15 (×6): qty 1

## 2024-11-15 MED ORDER — IRBESARTAN 150 MG PO TABS
150.0000 mg | ORAL_TABLET | Freq: Every day | ORAL | Status: DC
  Administered 2024-11-15 – 2024-11-18 (×4): 150 mg via ORAL
  Filled 2024-11-15 (×4): qty 1

## 2024-11-15 MED ORDER — TRAMADOL HCL 50 MG PO TABS
50.0000 mg | ORAL_TABLET | Freq: Two times a day (BID) | ORAL | Status: DC | PRN
  Administered 2024-11-15 – 2024-11-19 (×7): 50 mg via ORAL
  Filled 2024-11-15 (×8): qty 1

## 2024-11-15 NOTE — Progress Notes (Signed)
 Advanced Heart Failure Rounding Note  Cardiologist: Oneil Parchment, MD  Chief Complaint: PMVT w/ Syncope OOH Cardiac Arrest   Patient Profile/HPI    Kimberly Waters is a 78 y.o. female with history of breast cancer status post mastectomy, chemo and radiation, COPD, tobacco use, nonobstructive CAD, systolic HF (EF 40-45% on cMRI), prior history of polymorphic VT in the setting of prolonged QTc and hypothyroidism who is being seen 11/13/2024 for the evaluation of syncope and polymorphic VT.   Witnessed syncope and reported loss of pulses per family. Received ~2 min of CPR   On arrival to the ED she was having episodes of sustained polymorphic VT with R on T.  QTc prolonged at 471. Initially given IV amio but transitioned to lidocaine  gtt.     Subjective:    Continues on lidocaine  gtt @ 1 mg/min. No further VT.  Lido level 1.3   Echo EF 30-35%  C/o HA   Objective:   Weight Range: 86.2 kg Body mass index is 37.11 kg/m.   Vital Signs:   Temp:  [97.1 F (36.2 C)-98.3 F (36.8 C)] 97.4 F (36.3 C) (11/22 0700) Pulse Rate:  [49-80] 71 (11/22 0900) Resp:  [12-28] 21 (11/22 0900) BP: (85-194)/(44-173) 165/66 (11/22 0900) SpO2:  [91 %-100 %] 93 % (11/22 0900) Last BM Date : 11/14/24  Weight change: Filed Weights   11/13/24 2155  Weight: 86.2 kg    Intake/Output:   Intake/Output Summary (Last 24 hours) at 11/15/2024 1046 Last data filed at 11/15/2024 0900 Gross per 24 hour  Intake 917.98 ml  Output 1500 ml  Net -582.02 ml      Physical Exam   General:  Sitting up in bed. No resp difficulty HEENT: normal Neck: supple. no JVD.  Cor: Regular rate & rhythm. No rubs, gallops or murmurs. Lungs: clear Abdomen: obese soft, nontender, nondistended.Good bowel sounds. Extremities: no cyanosis, clubbing, rash, edema Neuro: alert & orientedx3, cranial nerves grossly intact. moves all 4 extremities w/o difficulty. Affect pleasant    Telemetry   Sinus 70s occasional PVC.  No VT Personally reviewed   Labs    CBC Recent Labs    11/13/24 2143 11/13/24 2210 11/14/24 0216 11/15/24 0039  WBC 10.9*  --  9.2 9.3  NEUTROABS 8.1*  --   --   --   HGB 12.2   < > 11.1* 11.0*  HCT 38.0   < > 35.5* 34.5*  MCV 94.1  --  94.2 96.4  PLT 325  --  254 254   < > = values in this interval not displayed.   Basic Metabolic Panel Recent Labs    88/78/74 0842 11/15/24 0039  NA 135 138  K 4.0 4.2  CL 100 102  CO2 25 26  GLUCOSE 99 99  BUN 14 18  CREATININE 1.39* 1.50*  CALCIUM  8.7* 8.5*  MG 2.9* 2.2   Liver Function Tests Recent Labs    11/14/24 0842 11/15/24 0039  AST 29 21  ALT 22 19  ALKPHOS 32* 34*  BILITOT 0.7 0.5  PROT 6.7 6.4*  ALBUMIN 3.5 3.3*   No results for input(s): LIPASE, AMYLASE in the last 72 hours. Cardiac Enzymes No results for input(s): CKTOTAL, CKMB, CKMBINDEX, TROPONINI in the last 72 hours.  BNP: BNP (last 3 results) Recent Labs    11/14/24 0216  BNP 968.6*    ProBNP (last 3 results) Recent Labs    10/16/24 1818  PROBNP 847.0*     D-Dimer  No results for input(s): DDIMER in the last 72 hours. Hemoglobin A1C No results for input(s): HGBA1C in the last 72 hours. Fasting Lipid Panel No results for input(s): CHOL, HDL, LDLCALC, TRIG, CHOLHDL, LDLDIRECT in the last 72 hours. Thyroid  Function Tests Recent Labs    11/13/24 2143  TSH 31.622*    Other results:   Imaging    No results found.    Medications:     Scheduled Medications:  Chlorhexidine  Gluconate Cloth  6 each Topical Daily   ezetimibe   10 mg Oral Daily   ipratropium  2 spray Nasal QID   levothyroxine   125 mcg Oral Q0600   rosuvastatin   20 mg Oral Daily   spironolactone   25 mg Oral Daily    Infusions:  lidocaine  1 mg/min (11/15/24 0900)    PRN Medications: acetaminophen , hydrALAZINE , LORazepam , mouth rinse      Assessment/Plan   1. OOH Arrest D/t PMVT - reported loss of pulse in the field w/ 2  min of CPR - sustained polymorphic VT with R on T on arrival.  QTc prolonged at 471. K 3.0, Mg 1.5 - now on lidocaine  gtt at 1 mg/min , K and Mg both appropriately corrected - no further VT - Will continue lido today. Wean tomorrow - D/w EP, given recurrent PMVT will need ICD. Plan early next week  - Echo EF 30-35%  - no indication for LHC. HS trop 81>>77. Cath in 2024 showed no CAD  - cMRI 2024 w/ no LGE. Normal ECV% and T2 values. EF 40-45%, RV nl  - Keep K > 4.0 Mg > 2.0   2. Hypokalemia/Hypomagnesemia - corrected - keep K > 4.0 and Mg > 2.0   3. Hypothyroidism - TSH 31.6, Free T4 0.60, Free T3 pending  - on levothyroxine  100 mcg PTA but per pt report, she may not have been taking it consistently  - synthroid  increased  4. Chronic Systolic Heart Failure - cMRI 7975 EF 40-45%, RV ok - BNP 968 on admission, CXR w/ mild pulmonary vascular congestion  - volume improved with lasix  - continue spironolactone  25 mg  - Echo EF 30-35% - Valsartan  not on formulary. Switch to irbesartan  - Pending ICD  5. HTN - adjusting meds  CRITICAL CARE Performed by: Toribio Fuel   Total critical care time: 34 minutes  Critical care time was exclusive of separately billable procedures and treating other patients.  Critical care was necessary to treat or prevent imminent or life-threatening deterioration.  Critical care was time spent personally by me on the following activities: development of treatment plan with patient and/or surrogate as well as nursing, discussions with consultants, evaluation of patient's response to treatment, examination of patient, obtaining history from patient or surrogate, ordering and performing treatments and interventions, ordering and review of laboratory studies, ordering and review of radiographic studies, pulse oximetry and re-evaluation of patient's condition.   Length of Stay: 2  Toribio Fuel, MD  11/15/2024, 10:46 AM  Advanced Heart Failure  Team Pager [phone] (M-F; 7a - 5p)  Please contact [facility] for night-coverage after hours (5p -7a ) and weekends on [email or web address]

## 2024-11-15 NOTE — Progress Notes (Signed)
 Progress Note  Patient Name: Kimberly Waters Date of Encounter: 11/15/2024  Primary Cardiologist: Oneil Parchment, MD  Electrophysiologist: Dr. Sidra Kitty   Patient Profile   78 y.o. female with a history of breast cancer status post mastectomy, chemo and radiation, COPD, tobacco use, nonobstructive CAD, prior history of polymorphic VT in the setting of prolonged QTc and hypothyroidism who is being followed for syncope/SCD 2/2 PMVT.  Interval events    No recurrent VT on lido gtt.  Overall feeling well with family at bedside.  No complaints today.  Inpatient Medications    Scheduled Meds:  Chlorhexidine  Gluconate Cloth  6 each Topical Daily   ezetimibe   10 mg Oral Daily   ipratropium  2 spray Nasal QID   irbesartan   150 mg Oral Daily   levothyroxine   125 mcg Oral Q0600   rosuvastatin   20 mg Oral Daily   spironolactone   25 mg Oral Daily   Continuous Infusions:  lidocaine  1 mg/min (11/15/24 1900)   PRN Meds: acetaminophen , hydrALAZINE , LORazepam , mouth rinse, traMADol    Vital Signs    Vitals:   11/15/24 1815 11/15/24 1830 11/15/24 1845 11/15/24 1900  BP: (!) 117/96 (!) 146/92 (!) 164/127 (!) 176/77  Pulse: 73 81 78 82  Resp: 19 19 19  (!) 21  Temp:      TempSrc:      SpO2: 95% 94% 94% 94%  Weight:      Height:        Intake/Output Summary (Last 24 hours) at 11/15/2024 1957 Last data filed at 11/15/2024 1900 Gross per 24 hour  Intake 827.11 ml  Output 1410 ml  Net -582.89 ml   Filed Weights   11/13/24 2155  Weight: 86.2 kg    Telemetry    NSR, no arrhythmias   Physical Exam   GEN: No acute distress.   Neck: No JVD Cardiac: RRR, no murmurs, rubs, or gallops.  Respiratory: Clear to auscultation bilaterally. Neuro:  Nonfocal  Psych: Normal affect   Labs    Chemistry Recent Labs  Lab 11/13/24 2143 11/13/24 2210 11/14/24 0216 11/14/24 0842 11/15/24 0039  NA 136   < > 138 135 138  K 3.0*   < > 3.4* 4.0 4.2  CL 101   < > 103 100 102  CO2 23  --   22 25 26   GLUCOSE 121*   < > 120* 99 99  BUN 13   < > 13 14 18   CREATININE 1.15*   < > 1.36* 1.39* 1.50*  CALCIUM  8.8*  --  8.5* 8.7* 8.5*  PROT 6.6  --   --  6.7 6.4*  ALBUMIN 3.6  --   --  3.5 3.3*  AST 34  --   --  29 21  ALT 24  --   --  22 19  ALKPHOS 35*  --   --  32* 34*  BILITOT 0.4  --   --  0.7 0.5  GFRNONAA 49*  --  40* 39* 36*  ANIONGAP 12  --  13 10 10    < > = values in this interval not displayed.    Hematology Recent Labs  Lab 11/13/24 2143 11/13/24 2210 11/14/24 0216 11/15/24 0039  WBC 10.9*  --  9.2 9.3  RBC 4.04  --  3.77* 3.58*  HGB 12.2 12.6 11.1* 11.0*  HCT 38.0 37.0 35.5* 34.5*  MCV 94.1  --  94.2 96.4  MCH 30.2  --  29.4 30.7  MCHC 32.1  --  31.3 31.9  RDW 13.3  --  13.4 13.5  PLT 325  --  254 254   BNP Recent Labs  Lab 11/14/24 0216  BNP 968.6*    Radiology    ECHOCARDIOGRAM COMPLETE Result Date: 11/14/2024    ECHOCARDIOGRAM REPORT   Patient Name:   Kimberly Waters Date of Exam: 11/14/2024 Medical Rec #:  [id number]     Height:       60.0 in Accession #:    [id number]    Weight:       190.0 lb Date of Birth:  Mar 02, 1946     BSA:          1.826 m Patient Age:    78 years      BP:           158/85 mmHg Patient Gender: F             HR:           52 bpm. Exam Location:  Inpatient Procedure: 2D Echo, Cardiac Doppler and Color Doppler (Both Spectral and Color            Flow Doppler were utilized during procedure). Indications:    Cardiomyopathy-Unspecified I42.9  History:        Patient has prior history of Echocardiogram examinations, most                 recent 01/24/2023. CHF, CAD, COPD and CKD, stage 3,                 Arrythmias:LBBB and Tachycardia, Signs/Symptoms:Hypotension,                 Dyspnea and Syncope; Risk Factors:Hypertension, Dyslipidemia and                 Current Smoker. Breast cancer (R) breast.  Sonographer:    Thea Norlander RCS Referring Phys: OZELL BUSHMAN  Sonographer Comments: Suboptimal subcostal window. Image acquisition  challenging due to mastectomy. IMPRESSIONS  1. Endocardium not well visualized. No definity given rib artifact in all apical views. Left ventricular ejection fraction, by estimation, is 30 to 35%. The left ventricle has moderately decreased function. The left ventricle demonstrates global hypokinesis. The left ventricular internal cavity size was moderately dilated. Left ventricular diastolic parameters are indeterminate.  2. Right ventricular systolic function is normal. The right ventricular size is normal.  3. Left atrial size was moderately dilated.  4. The mitral valve is abnormal. Mild mitral valve regurgitation. No evidence of mitral stenosis.  5. The aortic valve is tricuspid. There is mild calcification of the aortic valve. Aortic valve regurgitation is not visualized. Aortic valve sclerosis is present, with no evidence of aortic valve stenosis.  6. The inferior vena cava is normal in size with greater than 50% respiratory variability, suggesting right atrial pressure of 3 mmHg. FINDINGS  Left Ventricle: Endocardium not well visualized. No definity given rib artifact in all apical views. Left ventricular ejection fraction, by estimation, is 30 to 35%. The left ventricle has moderately decreased function. The left ventricle demonstrates global hypokinesis. Strain was performed and the global longitudinal strain is indeterminate. The left ventricular internal cavity size was moderately dilated. There is no left ventricular hypertrophy. Left ventricular diastolic parameters are indeterminate. Right Ventricle: The right ventricular size is normal. No increase in right ventricular wall thickness. Right ventricular systolic function is normal. Left Atrium: Left atrial size was moderately dilated. Right Atrium: Right atrial size was normal in size. Pericardium: There  is no evidence of pericardial effusion. Mitral Valve: The mitral valve is abnormal. There is mild thickening of the mitral valve leaflet(s). There is  mild calcification of the mitral valve leaflet(s). Mild mitral annular calcification. Mild mitral valve regurgitation. No evidence of mitral valve stenosis. Tricuspid Valve: The tricuspid valve is normal in structure. Tricuspid valve regurgitation is not demonstrated. No evidence of tricuspid stenosis. Aortic Valve: The aortic valve is tricuspid. There is mild calcification of the aortic valve. Aortic valve regurgitation is not visualized. Aortic valve sclerosis is present, with no evidence of aortic valve stenosis. Aortic valve peak gradient measures 6.5 mmHg. Pulmonic Valve: The pulmonic valve was normal in structure. Pulmonic valve regurgitation is not visualized. No evidence of pulmonic stenosis. Aorta: The aortic root is normal in size and structure. Venous: The inferior vena cava is normal in size with greater than 50% respiratory variability, suggesting right atrial pressure of 3 mmHg. IAS/Shunts: No atrial level shunt detected by color flow Doppler. Additional Comments: 3D was performed not requiring image post processing on an independent workstation and was indeterminate.  LEFT VENTRICLE PLAX 2D LVIDd:         5.40 cm   Diastology LVIDs:         3.90 cm   LV e' medial:    6.09 cm/s LV PW:         1.20 cm   LV E/e' medial:  15.8 LV IVS:        1.10 cm   LV e' lateral:   5.11 cm/s LVOT diam:     2.30 cm   LV E/e' lateral: 18.9 LV SV:         104 LV SV Index:   57 LVOT Area:     4.15 cm LV IVRT:       70 msec  RIGHT VENTRICLE             IVC RV S prime:     10.40 cm/s  IVC diam: 1.90 cm TAPSE (M-mode): 1.7 cm LEFT ATRIUM           Index        RIGHT ATRIUM           Index LA diam:      4.40 cm 2.41 cm/m   RA Area:     11.10 cm LA Vol (A2C): 49.7 ml 27.21 ml/m  RA Volume:   23.40 ml  12.81 ml/m LA Vol (A4C): 56.5 ml 30.94 ml/m  AORTIC VALVE AV Area (Vmax): 3.37 cm AV Vmax:        127.00 cm/s AV Peak Grad:   6.5 mmHg LVOT Vmax:      103.00 cm/s LVOT Vmean:     68.700 cm/s LVOT VTI:       0.251 m  AORTA  Ao Root diam: 3.00 cm Ao Asc diam:  3.30 cm MITRAL VALVE MV Area (PHT): 3.53 cm     SHUNTS MV Decel Time: 215 msec     Systemic VTI:  0.25 m MV E velocity: 96.40 cm/s   Systemic Diam: 2.30 cm MV A velocity: 107.00 cm/s MV E/A ratio:  0.90 Maude Emmer MD Electronically signed by Maude Emmer MD Signature Date/Time: 11/14/2024/11:43:53 AM    Final    DG Chest Port 1 View Result Date: 11/13/2024 CLINICAL DATA:  History of cardiac arrest presenting with ventricular tachycardia. EXAM: PORTABLE CHEST 1 VIEW COMPARISON:  October 16, 2024 FINDINGS: The cardiac silhouette is mildly enlarged and unchanged in size. There  is prominence of the pulmonary vasculature with mild, diffuse, chronic appearing increased interstitial lung markings. Mild atelectasis and/or infiltrate is seen within the retrocardiac region of the left lung base. No pleural effusion or pneumothorax is identified. No acute osseous abnormalities are identified. IMPRESSION: 1. Mild cardiomegaly with mild pulmonary vascular congestion. 2. Mild left basilar atelectasis and/or infiltrate. Electronically Signed   By: Suzen Dials M.D.   On: 11/13/2024 22:30    Cardiac Studies   TTE Result date: 11/14/24  1. Endocardium not well visualized. No definity given rib artifact in all  apical views. Left ventricular ejection fraction, by estimation, is 30 to  35%. The left ventricle has moderately decreased function. The left  ventricle demonstrates global  hypokinesis. The left ventricular internal cavity size was moderately  dilated. Left ventricular diastolic parameters are indeterminate.   2. Right ventricular systolic function is normal. The right ventricular  size is normal.   3. Left atrial size was moderately dilated.   4. The mitral valve is abnormal. Mild mitral valve regurgitation. No  evidence of mitral stenosis.   5. The aortic valve is tricuspid. There is mild calcification of the  aortic valve. Aortic valve regurgitation is not  visualized. Aortic valve  sclerosis is present, with no evidence of aortic valve stenosis.   6. The inferior vena cava is normal in size with greater than 50%  respiratory variability, suggesting right atrial pressure of 3 mmHg.   Patient Profile     78 y.o. female with a history of breast cancer status post mastectomy, chemo and radiation, COPD, tobacco use, nonobstructive CAD, prior history of polymorphic VT in the setting of prolonged QTc and hypothyroidism who is being followed for syncope/SCD 2/2 PMVT.  Assessment & Plan    PMVT SCD HFrEF/NICM  I long discussion with Ms. Dano and her family regarding secondary prevention ICD.  She had a similar presentation last year with PM VT that was thought to be related to hypothyroidism as a potential driver.  Her TSH on this admission again was elevated at 31 although a year ago was 66.  She also had associated hypokalemia.  Despite her electrolyte and thyroid  abnormalities this is the second time she likely has had PM VT leading to syncope in the setting of witnessed arrest.  Even if her thyroid  or electrolyte abnormalities are a trigger for this recurrent PM VT I would proceed with secondary prevention ICD.  Additionally her LVEF has declined from prior.  Although this may recover as she gets further out from her arrest I would not expect it to completely recover since it is chronically been depressed.  She may have some component of Takotsubo cardiomyopathy with her husband passing.  Additionally catecholamine surge after finding her husband deceased could have also factored into this.  Regardless she meets multiple criteria for secondary prevention ICD.  I reviewed the risk, benefits and alternatives to this with Ms. Carrithers and her family at bedside.  She understands that these risk include but are not limited to incisional site pain, hematoma, infection, damage to the lung or heart wall requiring emergency drain and/or median sternotomy, lead  perforation, dislodgment, arrhythmias and death.  She is agreeable to proceed with ICD implant.  Plan for single-chamber transvenous ICD as I would like the ability to have ATP.  She has IVCD but normal PR and slightly prolonged QRS so he likely will not need atrial pacing.  Tentatively planning for Monday pending schedule. Would continue on lido gtt for  now as her PVCs and PMVT appear to be suppressed, lido level 1.9 today.   For questions or updates, please contact [facility] Please consult [email or web address] for contact info under Cardiology/STEMI.   Signed, [name], MD  11/15/2024, 7:57 PM

## 2024-11-16 DIAGNOSIS — I472 Ventricular tachycardia, unspecified: Secondary | ICD-10-CM | POA: Diagnosis not present

## 2024-11-16 DIAGNOSIS — I5022 Chronic systolic (congestive) heart failure: Secondary | ICD-10-CM | POA: Diagnosis not present

## 2024-11-16 LAB — CBC
HCT: 39.6 % (ref 36.0–46.0)
Hemoglobin: 12.6 g/dL (ref 12.0–15.0)
MCH: 30.1 pg (ref 26.0–34.0)
MCHC: 31.8 g/dL (ref 30.0–36.0)
MCV: 94.5 fL (ref 80.0–100.0)
Platelets: 310 K/uL (ref 150–400)
RBC: 4.19 MIL/uL (ref 3.87–5.11)
RDW: 13.4 % (ref 11.5–15.5)
WBC: 10 K/uL (ref 4.0–10.5)
nRBC: 0 % (ref 0.0–0.2)

## 2024-11-16 LAB — T3: T3, Total: 79 ng/dL (ref 71–180)

## 2024-11-16 MED ORDER — PANTOPRAZOLE SODIUM 20 MG PO TBEC
20.0000 mg | DELAYED_RELEASE_TABLET | Freq: Every day | ORAL | Status: DC
  Administered 2024-11-16 – 2024-11-18 (×3): 20 mg via ORAL
  Filled 2024-11-16 (×3): qty 1

## 2024-11-16 NOTE — Progress Notes (Signed)
 Advanced Heart Failure Rounding Note  Cardiologist: Oneil Parchment, MD  Chief Complaint: PMVT w/ Syncope OOH Cardiac Arrest   Patient Profile/HPI    Kimberly Waters is a 78 y.o. female with history of breast cancer status post mastectomy, chemo and radiation, COPD, tobacco use, nonobstructive CAD, systolic HF (EF 40-45% on cMRI), prior history of polymorphic VT in the setting of prolonged QTc and hypothyroidism who is being seen 11/13/2024 for the evaluation of syncope and polymorphic VT.   Witnessed syncope and reported loss of pulses per family. Received ~2 min of CPR   On arrival to the ED she was having episodes of sustained polymorphic VT with R on T.  QTc prolonged at 471. Initially given IV amio but transitioned to lidocaine  gtt.    Echo EF 30-35%  Subjective:    Remains on lido 1 overnight. EP turned it off this am.   No further VT. HA resolved  Objective:   Weight Range: 86.2 kg Body mass index is 37.11 kg/m.   Vital Signs:   Temp:  [97 F (36.1 C)-98.3 F (36.8 C)] 97.1 F (36.2 C) (11/23 0700) Pulse Rate:  [57-93] 80 (11/23 1030) Resp:  [13-31] 23 (11/23 1030) BP: (90-192)/(44-127) 139/76 (11/23 1005) SpO2:  [88 %-97 %] 94 % (11/23 1030) Last BM Date : 11/14/24  Weight change: Filed Weights   11/13/24 2155  Weight: 86.2 kg    Intake/Output:   Intake/Output Summary (Last 24 hours) at 11/16/2024 1103 Last data filed at 11/16/2024 0658 Gross per 24 hour  Intake 530.91 ml  Output 1010 ml  Net -479.09 ml      Physical Exam   General:  Lying in bed. No resp difficulty HEENT: normal Neck: supple. no JVD.  Cor: Regular rate & rhythm. No rubs, gallops or murmurs. Lungs: clear Abdomen: obese soft, nontender, nondistended.Good bowel sounds. Extremities: no cyanosis, clubbing, rash, edema Neuro: alert & orientedx3, cranial nerves grossly intact. moves all 4 extremities w/o difficulty. Affect pleasant    Telemetry   Sinus 70-80s occasional PVC. No  VT Personally reviewed   Labs    CBC Recent Labs    11/13/24 2143 11/13/24 2210 11/15/24 0039 11/16/24 0430  WBC 10.9*   < > 9.3 10.0  NEUTROABS 8.1*  --   --   --   HGB 12.2   < > 11.0* 12.6  HCT 38.0   < > 34.5* 39.6  MCV 94.1   < > 96.4 94.5  PLT 325   < > 254 310   < > = values in this interval not displayed.   Basic Metabolic Panel Recent Labs    88/78/74 0842 11/15/24 0039  NA 135 138  K 4.0 4.2  CL 100 102  CO2 25 26  GLUCOSE 99 99  BUN 14 18  CREATININE 1.39* 1.50*  CALCIUM  8.7* 8.5*  MG 2.9* 2.2   Liver Function Tests Recent Labs    11/14/24 0842 11/15/24 0039  AST 29 21  ALT 22 19  ALKPHOS 32* 34*  BILITOT 0.7 0.5  PROT 6.7 6.4*  ALBUMIN 3.5 3.3*   No results for input(s): LIPASE, AMYLASE in the last 72 hours. Cardiac Enzymes No results for input(s): CKTOTAL, CKMB, CKMBINDEX, TROPONINI in the last 72 hours.  BNP: BNP (last 3 results) Recent Labs    11/14/24 0216  BNP 968.6*    ProBNP (last 3 results) Recent Labs    10/16/24 1818  PROBNP 847.0*     D-Dimer  No results for input(s): DDIMER in the last 72 hours. Hemoglobin A1C No results for input(s): HGBA1C in the last 72 hours. Fasting Lipid Panel No results for input(s): CHOL, HDL, LDLCALC, TRIG, CHOLHDL, LDLDIRECT in the last 72 hours. Thyroid  Function Tests Recent Labs    11/13/24 2143  TSH 31.622*    Other results:   Imaging    No results found.    Medications:     Scheduled Medications:  Chlorhexidine  Gluconate Cloth  6 each Topical Daily   ezetimibe   10 mg Oral Daily   ipratropium  2 spray Nasal QID   irbesartan   150 mg Oral Daily   levothyroxine   125 mcg Oral Q0600   rosuvastatin   20 mg Oral Daily   spironolactone   25 mg Oral Daily    Infusions:    PRN Medications: acetaminophen , hydrALAZINE , LORazepam , mouth rinse, traMADol       Assessment/Plan   1. OOH Arrest D/t PMVT - reported loss of pulse in the field  w/ 2 min of CPR - sustained polymorphic VT with R on T on arrival.  QTc prolonged at 471. K 3.0, Mg 1.5 - on lidocaine  gtt at 1 mg/min overnight. No recurrent VT. , K and Mg both appropriately corrected - d/w EP Will stop lido today May need mexilitene - For ICD tomorrow - Echo EF 30-35%  - no indication for LHC. HS trop 81>>77. Cath in 2024 showed no CAD  - cMRI 2024 w/ no LGE. Normal ECV% and T2 values. EF 40-45%, RV nl  - Keep K > 4.0 Mg > 2.0   2. Hypokalemia/Hypomagnesemia - K 4.2 Mg 2.2 - keep K > 4.0 and Mg > 2.0   3. Hypothyroidism - TSH 31.6, Free T4 0.60, Free T3 pending  - on levothyroxine  100 mcg PTA but per pt report, she may not have been taking it consistently  - synthroid  increased  4. Chronic Systolic Heart Failure - cMRI 7975 EF 40-45%, RV ok - BNP 968 on admission, CXR w/ mild pulmonary vascular congestion  - volume improved with lasix  - continue spironolactone  25 mg  - Echo EF 30-35% - Continue irbesartan  and spiro - titrate GDMT prior to d/c - Pending ICD  5. HTN - BP improved   Length of Stay: 3  Toribio Fuel, MD  11/16/2024, 11:03 AM  Advanced Heart Failure Team Pager [phone] (M-F; 7a - 5p)  Please contact [facility] for night-coverage after hours (5p -7a ) and weekends on [email or web address]

## 2024-11-16 NOTE — Progress Notes (Signed)
 Progress Note  Patient Name: Kimberly Waters Date of Encounter: 11/16/2024  Primary Cardiologist: Oneil Parchment, MD   Patient ID  78 y.o. female with a history of breast cancer status post mastectomy, chemo and radiation, COPD, tobacco use, nonobstructive CAD, prior history of polymorphic VT in the setting of prolonged QTc and hypothyroidism who is being followed for syncope/SCD 2/2 PMVT.   Subjective   No overnight events, occasional PVCs that are late coupled but >200 ms from T wave. No NSVT, on lido gtt at 1 mcg/min.   Inpatient Medications    Scheduled Meds:  Chlorhexidine  Gluconate Cloth  6 each Topical Daily   ezetimibe   10 mg Oral Daily   ipratropium  2 spray Nasal QID   irbesartan   150 mg Oral Daily   levothyroxine   125 mcg Oral Q0600   rosuvastatin   20 mg Oral Daily   spironolactone   25 mg Oral Daily   Continuous Infusions:  lidocaine  1 mg/min (11/16/24 0500)   PRN Meds: acetaminophen , hydrALAZINE , LORazepam , mouth rinse, traMADol    Vital Signs    Vitals:   11/16/24 0335 11/16/24 0400 11/16/24 0430 11/16/24 0500  BP: (!) 175/73 134/62 (!) 152/55 (!) 165/83  Pulse: 66 68 77 77  Resp: 13 19 18 16   Temp:      TempSrc:      SpO2: 94% 95% 95% 95%  Weight:      Height:        Intake/Output Summary (Last 24 hours) at 11/16/2024 0537 Last data filed at 11/16/2024 0500 Gross per 24 hour  Intake 597.44 ml  Output 1260 ml  Net -662.56 ml   Filed Weights   11/13/24 2155  Weight: 86.2 kg    Telemetry    NSR, occasional PVCs  ECG    None today   Physical Exam   GEN: No acute distress, sleeping  Neck: No JVD Cardiac: RRR, no murmurs, rubs, or gallops.  Respiratory: Clear to auscultation bilaterally. GI: Soft, nontender, non-distended  MS: No edema; No deformity. Neuro:  Nonfocal  Psych: Normal affect   Labs    Chemistry Recent Labs  Lab 11/13/24 2143 11/13/24 2210 11/14/24 0216 11/14/24 0842 11/15/24 0039  NA 136   < > 138 135 138  K  3.0*   < > 3.4* 4.0 4.2  CL 101   < > 103 100 102  CO2 23  --  22 25 26   GLUCOSE 121*   < > 120* 99 99  BUN 13   < > 13 14 18   CREATININE 1.15*   < > 1.36* 1.39* 1.50*  CALCIUM  8.8*  --  8.5* 8.7* 8.5*  PROT 6.6  --   --  6.7 6.4*  ALBUMIN 3.6  --   --  3.5 3.3*  AST 34  --   --  29 21  ALT 24  --   --  22 19  ALKPHOS 35*  --   --  32* 34*  BILITOT 0.4  --   --  0.7 0.5  GFRNONAA 49*  --  40* 39* 36*  ANIONGAP 12  --  13 10 10    < > = values in this interval not displayed.    Hematology Recent Labs  Lab 11/14/24 0216 11/15/24 0039 11/16/24 0430  WBC 9.2 9.3 10.0  RBC 3.77* 3.58* 4.19  HGB 11.1* 11.0* 12.6  HCT 35.5* 34.5* 39.6  MCV 94.2 96.4 94.5  MCH 29.4 30.7 30.1  MCHC 31.3 31.9 31.8  RDW 13.4 13.5 13.4  PLT 254 254 310   Cardiac EnzymesNo results for input(s): TROPONINI in the last 168 hours. No results for input(s): TROPIPOC in the last 168 hours.   BNP Recent Labs  Lab 11/14/24 0216  BNP 968.6*     DDimer No results for input(s): DDIMER in the last 168 hours.   Radiology    ECHOCARDIOGRAM COMPLETE Result Date: 11/14/2024    ECHOCARDIOGRAM REPORT   Patient Name:   Kimberly Waters Date of Exam: 11/14/2024 Medical Rec #:  [id number]     Height:       60.0 in Accession #:    [id number]    Weight:       190.0 lb Date of Birth:  [birth year]     BSA:          1.826 m Patient Age:    78 years      BP:           158/85 mmHg Patient Gender: F             HR:           52 bpm. Exam Location:  Inpatient Procedure: 2D Echo, Cardiac Doppler and Color Doppler (Both Spectral and Color            Flow Doppler were utilized during procedure). Indications:    Cardiomyopathy-Unspecified I42.9  History:        Patient has prior history of Echocardiogram examinations, most                 recent 01/24/2023. CHF, CAD, COPD and CKD, stage 3,                 Arrythmias:LBBB and Tachycardia, Signs/Symptoms:Hypotension,                 Dyspnea and Syncope; Risk Factors:Hypertension,  Dyslipidemia and                 Current Smoker. Breast cancer (R) breast.  Sonographer:    Thea Norlander RCS Referring Phys: OZELL BUSHMAN  Sonographer Comments: Suboptimal subcostal window. Image acquisition challenging due to mastectomy. IMPRESSIONS  1. Endocardium not well visualized. No definity given rib artifact in all apical views. Left ventricular ejection fraction, by estimation, is 30 to 35%. The left ventricle has moderately decreased function. The left ventricle demonstrates global hypokinesis. The left ventricular internal cavity size was moderately dilated. Left ventricular diastolic parameters are indeterminate.  2. Right ventricular systolic function is normal. The right ventricular size is normal.  3. Left atrial size was moderately dilated.  4. The mitral valve is abnormal. Mild mitral valve regurgitation. No evidence of mitral stenosis.  5. The aortic valve is tricuspid. There is mild calcification of the aortic valve. Aortic valve regurgitation is not visualized. Aortic valve sclerosis is present, with no evidence of aortic valve stenosis.  6. The inferior vena cava is normal in size with greater than 50% respiratory variability, suggesting right atrial pressure of 3 mmHg. FINDINGS  Left Ventricle: Endocardium not well visualized. No definity given rib artifact in all apical views. Left ventricular ejection fraction, by estimation, is 30 to 35%. The left ventricle has moderately decreased function. The left ventricle demonstrates global hypokinesis. Strain was performed and the global longitudinal strain is indeterminate. The left ventricular internal cavity size was moderately dilated. There is no left ventricular hypertrophy. Left ventricular diastolic parameters are indeterminate. Right Ventricle: The right ventricular size is normal. No increase in right  ventricular wall thickness. Right ventricular systolic function is normal. Left Atrium: Left atrial size was moderately dilated. Right  Atrium: Right atrial size was normal in size. Pericardium: There is no evidence of pericardial effusion. Mitral Valve: The mitral valve is abnormal. There is mild thickening of the mitral valve leaflet(s). There is mild calcification of the mitral valve leaflet(s). Mild mitral annular calcification. Mild mitral valve regurgitation. No evidence of mitral valve stenosis. Tricuspid Valve: The tricuspid valve is normal in structure. Tricuspid valve regurgitation is not demonstrated. No evidence of tricuspid stenosis. Aortic Valve: The aortic valve is tricuspid. There is mild calcification of the aortic valve. Aortic valve regurgitation is not visualized. Aortic valve sclerosis is present, with no evidence of aortic valve stenosis. Aortic valve peak gradient measures 6.5 mmHg. Pulmonic Valve: The pulmonic valve was normal in structure. Pulmonic valve regurgitation is not visualized. No evidence of pulmonic stenosis. Aorta: The aortic root is normal in size and structure. Venous: The inferior vena cava is normal in size with greater than 50% respiratory variability, suggesting right atrial pressure of 3 mmHg. IAS/Shunts: No atrial level shunt detected by color flow Doppler. Additional Comments: 3D was performed not requiring image post processing on an independent workstation and was indeterminate.  LEFT VENTRICLE PLAX 2D LVIDd:         5.40 cm   Diastology LVIDs:         3.90 cm   LV e' medial:    6.09 cm/s LV PW:         1.20 cm   LV E/e' medial:  15.8 LV IVS:        1.10 cm   LV e' lateral:   5.11 cm/s LVOT diam:     2.30 cm   LV E/e' lateral: 18.9 LV SV:         104 LV SV Index:   57 LVOT Area:     4.15 cm LV IVRT:       70 msec  RIGHT VENTRICLE             IVC RV S prime:     10.40 cm/s  IVC diam: 1.90 cm TAPSE (M-mode): 1.7 cm LEFT ATRIUM           Index        RIGHT ATRIUM           Index LA diam:      4.40 cm 2.41 cm/m   RA Area:     11.10 cm LA Vol (A2C): 49.7 ml 27.21 ml/m  RA Volume:   23.40 ml  12.81 ml/m  LA Vol (A4C): 56.5 ml 30.94 ml/m  AORTIC VALVE AV Area (Vmax): 3.37 cm AV Vmax:        127.00 cm/s AV Peak Grad:   6.5 mmHg LVOT Vmax:      103.00 cm/s LVOT Vmean:     68.700 cm/s LVOT VTI:       0.251 m  AORTA Ao Root diam: 3.00 cm Ao Asc diam:  3.30 cm MITRAL VALVE MV Area (PHT): 3.53 cm     SHUNTS MV Decel Time: 215 msec     Systemic VTI:  0.25 m MV E velocity: 96.40 cm/s   Systemic Diam: 2.30 cm MV A velocity: 107.00 cm/s MV E/A ratio:  0.90 Maude Emmer MD Electronically signed by Maude Emmer MD Signature Date/Time: 11/14/2024/11:43:53 AM    Final    Cardiac Studies   TTE Result date: 11/14/24  1. Endocardium not well  visualized. No definity given rib artifact in all  apical views. Left ventricular ejection fraction, by estimation, is 30 to  35%. The left ventricle has moderately decreased function. The left  ventricle demonstrates global  hypokinesis. The left ventricular internal cavity size was moderately  dilated. Left ventricular diastolic parameters are indeterminate.   2. Right ventricular systolic function is normal. The right ventricular  size is normal.   3. Left atrial size was moderately dilated.   4. The mitral valve is abnormal. Mild mitral valve regurgitation. No  evidence of mitral stenosis.   5. The aortic valve is tricuspid. There is mild calcification of the  aortic valve. Aortic valve regurgitation is not visualized. Aortic valve  sclerosis is present, with no evidence of aortic valve stenosis.   6. The inferior vena cava is normal in size with greater than 50%  respiratory variability, suggesting right atrial pressure of 3 mmHg.  Patient Profile     78 y.o. female with a history of breast cancer status post mastectomy, chemo and radiation, COPD, tobacco use, nonobstructive CAD, prior history of polymorphic VT in the setting of prolonged QTc and hypothyroidism who is being followed for syncope/SCD 2/2 PMVT. Now awaiting 2' prevention ICD implant.   Assessment &  Plan    PMVT SCD HFrEF/NICM  No overnight events. No recurrent PMVT/NSVT while on lido gtt. Plan for LEFT [place] ICD for 2' prevention on Monday. If able to get ICD done Monday then may discharge following this (M/T). Would like to see if PVCs resume once off lido and if she needs mexiletine or another AAD before discharge. Stop lido this morning and will assess PVC burden and or recurrent PMVT the next 24 hours so we can start planning for DC post device implant.   For questions or updates, please contact [facility] Please consult [email or web address] for contact info under Cardiology/STEMI.     Signed, [name], MD  11/16/2024, 5:37 AM

## 2024-11-17 ENCOUNTER — Inpatient Hospital Stay (HOSPITAL_COMMUNITY)
Admission: EM | Disposition: A | Discharge status: Home Health | Payer: Self-pay | Source: Home / Self Care | Attending: Student in an Organized Health Care Education/Training Program

## 2024-11-17 ENCOUNTER — Other Ambulatory Visit (HOSPITAL_COMMUNITY): Payer: Self-pay

## 2024-11-17 DIAGNOSIS — I472 Ventricular tachycardia, unspecified: Secondary | ICD-10-CM | POA: Diagnosis not present

## 2024-11-17 DIAGNOSIS — I469 Cardiac arrest, cause unspecified: Secondary | ICD-10-CM | POA: Diagnosis not present

## 2024-11-17 DIAGNOSIS — I5022 Chronic systolic (congestive) heart failure: Secondary | ICD-10-CM | POA: Diagnosis not present

## 2024-11-17 DIAGNOSIS — I10 Essential (primary) hypertension: Secondary | ICD-10-CM | POA: Diagnosis not present

## 2024-11-17 HISTORY — PX: ICD IMPLANT: EP1208

## 2024-11-17 LAB — BASIC METABOLIC PANEL WITH GFR
Anion gap: 15 (ref 5–15)
BUN: 11 mg/dL (ref 8–23)
CO2: 22 mmol/L (ref 22–32)
Calcium: 8.9 mg/dL (ref 8.9–10.3)
Chloride: 98 mmol/L (ref 98–111)
Creatinine, Ser: 1.06 mg/dL — ABNORMAL HIGH (ref 0.44–1.00)
GFR, Estimated: 54 mL/min — ABNORMAL LOW (ref >60)
Glucose, Bld: 93 mg/dL (ref 70–99)
Potassium: 4.2 mmol/L (ref 3.5–5.1)
Sodium: 135 mmol/L (ref 135–145)

## 2024-11-17 LAB — SURGICAL PCR SCREEN
MRSA, PCR: NEGATIVE
Staphylococcus aureus: NEGATIVE

## 2024-11-17 LAB — CBC
HCT: 38.9 % (ref 36.0–46.0)
Hemoglobin: 12.2 g/dL (ref 12.0–15.0)
MCH: 30 pg (ref 26.0–34.0)
MCHC: 31.4 g/dL (ref 30.0–36.0)
MCV: 95.8 fL (ref 80.0–100.0)
Platelets: 310 K/uL (ref 150–400)
RBC: 4.06 MIL/uL (ref 3.87–5.11)
RDW: 13.4 % (ref 11.5–15.5)
WBC: 10 K/uL (ref 4.0–10.5)
nRBC: 0 % (ref 0.0–0.2)

## 2024-11-17 SURGERY — ICD IMPLANT

## 2024-11-17 MED ORDER — SODIUM CHLORIDE 0.9 % IV SOLN
INTRAVENOUS | Status: AC
  Administered 2024-11-17: 80 mg
  Filled 2024-11-17: qty 2

## 2024-11-17 MED ORDER — SODIUM CHLORIDE 0.9 % IV SOLN: 80.0000 mg | INTRAVENOUS | Status: AC

## 2024-11-17 MED ORDER — CALCIUM CARBONATE ANTACID 500 MG PO CHEW
1.0000 | CHEWABLE_TABLET | Freq: Two times a day (BID) | ORAL | Status: DC | PRN
  Administered 2024-11-17 – 2024-11-18 (×3): 200 mg via ORAL
  Filled 2024-11-17 (×3): qty 1

## 2024-11-17 MED ORDER — LIDOCAINE HCL (PF) 1 % IJ SOLN
INTRAMUSCULAR | Status: AC
  Filled 2024-11-17: qty 30

## 2024-11-17 MED ORDER — CEFAZOLIN SODIUM-DEXTROSE 2-4 GM/100ML-% IV SOLN: 2.0000 g | INTRAVENOUS | Status: AC

## 2024-11-17 MED ORDER — LIDOCAINE HCL (PF) 1 % IJ SOLN
INTRAMUSCULAR | Status: DC | PRN
  Administered 2024-11-17: 30 mL

## 2024-11-17 MED ORDER — MEXILETINE HCL 150 MG PO CAPS
150.0000 mg | ORAL_CAPSULE | Freq: Two times a day (BID) | ORAL | Status: DC
  Administered 2024-11-17 – 2024-11-19 (×5): 150 mg via ORAL
  Filled 2024-11-17 (×6): qty 1

## 2024-11-17 MED ORDER — TRIMETHOBENZAMIDE HCL 100 MG/ML IM SOLN
200.0000 mg | Freq: Three times a day (TID) | INTRAMUSCULAR | Status: DC | PRN
  Administered 2024-11-17 – 2024-11-19 (×2): 200 mg via INTRAMUSCULAR
  Filled 2024-11-17 (×3): qty 2

## 2024-11-17 MED ORDER — SODIUM CHLORIDE 0.9 % IV SOLN: INTRAVENOUS | Status: DC

## 2024-11-17 MED ORDER — FENTANYL CITRATE (PF) 100 MCG/2ML IJ SOLN
INTRAMUSCULAR | Status: AC
  Filled 2024-11-17: qty 2

## 2024-11-17 MED ORDER — LIDOCAINE HCL (PF) 1 % IJ SOLN
INTRAMUSCULAR | Status: AC
  Filled 2024-11-17: qty 60

## 2024-11-17 MED ORDER — MIDAZOLAM HCL 5 MG/5ML IJ SOLN
INTRAMUSCULAR | Status: DC | PRN
  Administered 2024-11-17: .5 mg via INTRAVENOUS
  Administered 2024-11-17: 1 mg via INTRAVENOUS

## 2024-11-17 MED ORDER — FENTANYL CITRATE (PF) 100 MCG/2ML IJ SOLN
INTRAMUSCULAR | Status: DC | PRN
  Administered 2024-11-17: 50 ug via INTRAVENOUS
  Administered 2024-11-17: 25 ug via INTRAVENOUS

## 2024-11-17 MED ORDER — MIDAZOLAM HCL 2 MG/2ML IJ SOLN
INTRAMUSCULAR | Status: AC
Start: 2024-11-17 — End: 2024-11-17
  Filled 2024-11-17: qty 2

## 2024-11-17 MED ORDER — HEPARIN (PORCINE) IN NACL 1000-0.9 UT/500ML-% IV SOLN
INTRAVENOUS | Status: DC | PRN
  Administered 2024-11-17: 500 mL

## 2024-11-17 MED ORDER — CHLORHEXIDINE GLUCONATE 4 % EX SOLN
CUTANEOUS | Status: AC
  Filled 2024-11-17: qty 15

## 2024-11-17 MED ORDER — CEFAZOLIN SODIUM-DEXTROSE 2-4 GM/100ML-% IV SOLN
INTRAVENOUS | Status: AC
  Administered 2024-11-17: 2 g via INTRAVENOUS
  Filled 2024-11-17: qty 100

## 2024-11-17 SURGICAL SUPPLY — 7 items
CABLE SURGICAL S-101-97-12 (CABLE) ×2 IMPLANT
ICD VIGILANT VR D232 (Pacemaker) IMPLANT
LEAD RELIANCE 0672 IMPLANT
PAD DEFIB RADIO PHYSIO CONN (PAD) ×2 IMPLANT
SHEATH 8FR PRELUDE SNAP 13 (SHEATH) IMPLANT
SHEATH PROBE COVER 6X72 (BAG) IMPLANT
TRAY PACEMAKER INSERTION (PACKS) ×2 IMPLANT

## 2024-11-17 NOTE — Progress Notes (Signed)
 Progress Note  Patient Name: Kimberly Waters Date of Encounter: 11/17/2024  Primary Cardiologist: Kimberly Parchment, MD   Patient ID  78 y.o. female with a history of breast cancer status post mastectomy, chemo and radiation, COPD, tobacco use, nonobstructive CAD, prior history of polymorphic VT in the setting of prolonged QTc and hypothyroidism who is being followed for syncope/SCD 2/2 PMVT.   Subjective   No overnight events.  She was a little bit sad this morning after thinking about her husband passing so unexpectedly.   She is having infrequent but some closely couple PVCs that could initiate recurrent polymorphic VT.  Plan to start mexiletine this morning.  Potential add-on case later this afternoon.  N.p.o. other than clear liquids this morning.  Reviewed risk, benefits and alternatives again with patient.  She is agreeable to 3.  Inpatient Medications    Scheduled Meds:  Chlorhexidine  Gluconate Cloth  6 each Topical Daily   ezetimibe   10 mg Oral Daily   ipratropium  2 spray Nasal QID   irbesartan   150 mg Oral Daily   levothyroxine   125 mcg Oral Q0600   pantoprazole   20 mg Oral Daily   rosuvastatin   20 mg Oral Daily   spironolactone   25 mg Oral Daily   Continuous Infusions:  PRN Meds: acetaminophen , hydrALAZINE , LORazepam , mouth rinse, traMADol    Vital Signs    Vitals:   11/17/24 0100 11/17/24 0200 11/17/24 0300 11/17/24 0400  BP: (!) 152/60 (!) 166/61 (!) 167/60   Pulse: 75 73 73   Resp: (!) 23 (!) 23 17   Temp:    98.1 F (36.7 C)  TempSrc:    Oral  SpO2: 95% 96% 94%   Weight:      Height:        Intake/Output Summary (Last 24 hours) at 11/17/2024 0528 Last data filed at 11/17/2024 0400 Gross per 24 hour  Intake 381.18 ml  Output 1225 ml  Net -843.82 ml   Filed Weights   11/13/24 2155  Weight: 86.2 kg   Telemetry    NSR with infrequent PVCs, some closely coupled - Personally Reviewed  ECG    None today   Physical Exam   GEN: No acute distress.    Neck: No JVD Cardiac: RRR, no murmurs, rubs, or gallops.  Respiratory: Clear to auscultation bilaterally. GI: Soft, nontender, non-distended  MS: No edema; No deformity. Neuro:  Nonfocal  Psych: Normal affect   Labs    Chemistry Recent Labs  Lab 11/13/24 2143 11/13/24 2210 11/14/24 0842 11/15/24 0039 11/17/24 0141  NA 136   < > 135 138 135  K 3.0*   < > 4.0 4.2 4.2  CL 101   < > 100 102 98  CO2 23   < > 25 26 22   GLUCOSE 121*   < > 99 99 93  BUN 13   < > 14 18 11   CREATININE 1.15*   < > 1.39* 1.50* 1.06*  CALCIUM  8.8*   < > 8.7* 8.5* 8.9  PROT 6.6  --  6.7 6.4*  --   ALBUMIN 3.6  --  3.5 3.3*  --   AST 34  --  29 21  --   ALT 24  --  22 19  --   ALKPHOS 35*  --  32* 34*  --   BILITOT 0.4  --  0.7 0.5  --   GFRNONAA 49*   < > 39* 36* 54*  ANIONGAP 12   < >  10 10 15    < > = values in this interval not displayed.    Hematology Recent Labs  Lab 11/15/24 0039 11/16/24 0430 11/17/24 0141  WBC 9.3 10.0 10.0  RBC 3.58* 4.19 4.06  HGB 11.0* 12.6 12.2  HCT 34.5* 39.6 38.9  MCV 96.4 94.5 95.8  MCH 30.7 30.1 30.0  MCHC 31.9 31.8 31.4  RDW 13.5 13.4 13.4  PLT 254 310 310   BNP Recent Labs  Lab 11/14/24 0216  BNP 968.6*    Radiology   None today  Cardiac Studies   None today   Assessment & Plan  78 y.o. female with a history of breast cancer status post mastectomy, chemo and radiation, COPD, tobacco use, nonobstructive CAD, prior history of polymorphic VT in the setting of prolonged QTc and hypothyroidism who is being followed for syncope/SCD 2/2 PMVT. Now awaiting 2' prevention ICD implant.    PMVT SCD HFrEF/NICM  No overnight events. No recurrent PMVT.1 brief 4 beat run of NSVT. Having occasional closely couple PVCs that could initiate polymorphic VT.  Start mexiletine 150 mg twice daily.  Potential add-on later today for LEFT [place] 2' prevention ICD.  If able to get done today she could be a potential discharge later today versus tomorrow.  Reviewed risk  benefits alternatives of device implant again today and explained the role of mexiletine.  She is agreeable with both.  For questions or updates, please contact [facility] Please consult [email or web address] for contact info under Cardiology/STEMI.   Signed,  Kimberly DELENA Primus, MD [facility]  Cardiac Electrophysiology  11/17/2024, 5:28 AM

## 2024-11-17 NOTE — Discharge Instructions (Signed)
 After Your ICD (Implantable Cardiac Defibrillator)   You have a [employer] ICD  If you have a Medtronic or Biotronik device, plug in your home monitor once you get home, and no manual interaction is required.   If you have an Abbott or Autozone device, plug your home monitor once you get home, sit near the device, and press the large activation button. Sit nearby until the process is complete, usually notated by lights on the monitor.   If you were set up for monitoring using an app on your phone, make sure the app remains open in the background and the Bluetooth remains on.  ACTIVITY Do not lift your arm above shoulder height for 1 week after your procedure. After 7 days, you may progress as below.  You should remove your sling 24 hours after your procedure, unless otherwise instructed by your provider.     Monday November 24, 2024  Tuesday November 25, 2024 Wednesday November 26, 2024 Thursday November 27, 2024   Do not lift, push, pull, or carry anything over 10 pounds with the affected arm until 6 weeks (Monday December 29, 2024 ) after your procedure.   You may drive AFTER your wound check, unless you have been told otherwise by your provider.   Ask your healthcare provider when you can go back to work   INCISION/Dressing If you are on a blood thinner such as Coumadin, Xarelto, Eliquis, Plavix, or Pradaxa please confirm with your provider when this should be resumed.   If large square, outer bandage is left in place, this can be removed after 24 hours from your procedure. Do not remove steri-strips or glue as below.   Monitor your defibrillator site for redness, swelling, and drainage. Call the device clinic at [phone] if you experience these symptoms or fever/chills.    If your incision is sealed with Dermabond (glue), you may shower 24 hrs after your procedure or when told by your provider. Do not remove the glue or let the shower hit directly on your site. You  may wash around your site with soap and water.    If you were discharged in a sling, please do not wear this during the day more than 48 hours after your surgery unless otherwise instructed. This may increase the risk of stiffness and soreness in your shoulder.   Avoid lotions, ointments, or perfumes over your incision until it is well-healed.  You may use a hot tub or a pool AFTER your wound check appointment if the incision is completely closed.  Your ICD is designed to protect you from life threatening heart rhythms. Because of this, you may receive a shock.   1 shock with no symptoms:  Call the office during business hours. 1 shock with symptoms (chest pain, chest pressure, dizziness, lightheadedness, shortness of breath, overall feeling unwell):  Call 911. If you experience 2 or more shocks in 24 hours:  Call 911. If you receive a shock, you should not drive for 6 months per the [place] DMV IF you receive appropriate therapy from your ICD.   ICD Alerts:  Some alerts are vibratory and others beep. These are NOT emergencies. Please call our office to let us  know. If this occurs at night or on weekends, it can wait until the next business day. Send a remote transmission.  If your device is capable of reading fluid status (for heart failure), you will be offered monthly monitoring to review this with you.   DEVICE MANAGEMENT  Remote monitoring is used to monitor your ICD from home. This monitoring is scheduled every 91 days by our office. It allows us  to keep an eye on the functioning of your device to ensure it is working properly. You will routinely see your Electrophysiologist annually (more often if necessary). This will appear as a REMOTE check on your MyChart schedule. These are automatic and there is nothing for you to manually do unless otherwise instructed.  You should receive your ID card for your new device in 4-8 weeks. Keep this card with you at all times once received. Consider wearing  a medical alert bracelet or necklace.  Your ICD  may be MRI compatible. This will be discussed at your next office visit/wound check.  You should avoid contact with strong electric or magnetic fields.   Do not use amateur (ham) radio equipment or electric (arc) welding torches. MP3 player headphones with magnets should not be used. Some devices are safe to use if held at least 12 inches (30 cm) from your defibrillator. These include power tools, lawn mowers, and speakers. If you are unsure if something is safe to use, ask your health care provider.  When using your cell phone, hold it to the ear that is on the opposite side from the defibrillator. Do not leave your cell phone in a pocket over the defibrillator.  You may safely use electric blankets, heating pads, computers, and microwave ovens.  Call the office right away if: You have chest pain. You feel more than one shock. You feel more short of breath than you have felt before. You feel more light-headed than you have felt before. Your incision starts to open up.  This information is not intended to replace advice given to you by your health care provider. Make sure you discuss any questions you have with your health care provider.

## 2024-11-17 NOTE — Op Note (Signed)
   Procedure:  LEFT sided BSCI Dual Chamber ICD Implant  Pre-op Diagnosis: PMVT, VT arrest, 2' prevention SCD    Post-op Diagnosis: same  Procedure Date: 11/17/24  Attending: Adina Primus, MD  Anesthesia: monitored anesthesia care   Procedure Report:  The LEFT shoulder was prepped with chlorhexidine  scrub. 70 cc lidocaine  was injected at the planned incision site. A pocket was created with electrocautery and blunt dissection.    No venogram was performed. The LEFT axillary vein was accessed using a standard access needle under ultrasound guidance. A wire was passed into the IVC.   An 8 Fr sheath was advanced and the dilator removed. The RV ICD lead was inserted into the sheath and the lead was then advanced into the RVOT. After exchange of pigtail shaped stylet for a curved stylet, the lead was retracted into the RV cavity and advanced to the RV apical septum. Two view flouroscopy then confirmed RV apical, septal position. The helix was extended and the lead fixation was tested with lead tension. Interrogation revealed a current of injury with acceptable lead parameters (see below). The sheath was split and removed and the RV ICD lead was firmly attached to the pre-pectoralis fascia with three 0-silk sutures.   The device was then connected to the leads. The pocket was irrigated with Irrisept.  The device was affixed to the underlying fascia with a single 0-silk suture. The pocket was closed with continuous 2-0 Vicryl, 2-0 V-Loc and 3-0 Strafafix. Dermabond was applied over the incision.   The device was evaluated by the representative of the cardiac device manufacturer with implant parameters listed below.   Complications: none Estimated blood loss: less than 50 mL  Implanted Hardware: Implant Name Type Inv. Item Serial No. Manufacturer Lot No. LRB No. Used Action  LEAD RELIANCE 0672 - [id number]  LEAD RELIANCE 0672 [id number] [employer]  N/A 1 Implanted  ICD VIGILANT VR D232  - [id number] Pacemaker ICD VIGILANT VR [id number] [id number] [employer]  N/A 1 Implanted   Device Information: ICD Generator: BSCI Resonate EL ICD VR D232, SN: [id number], DOI 11/17/24 RV: BSCI [place] 4-Front SGL Active, SN: [id number], DOI 11/17/24  Lead Interrogation Data: RV: 12.3 mV / 573 ohms / 0.5 V @ 0.5 ms Shock Impedance: 69 ohms  Summary: 1. Successful implant of a LEFT sided BSCI [place] VVI ICD   Recommendations: Routine post-procedure care with bedrest for 3 hours No heparin  (IV or subcutaneous) for 48 hours. No enoxaparin  (IV or subcutaneous) for 7 days.  PA/lateral CXR in AM Wound check in AM Device interrogation in AM  [name], MD [facility]  Cardiac Electrophysiology

## 2024-11-17 NOTE — Plan of Care (Signed)
 ICD placed today, no ectopy and or runs of V tach. Patient alert and relaxed.

## 2024-11-17 NOTE — Discharge Summary (Incomplete)
 Advanced Heart Failure Team  Discharge Summary   Patient ID: Kimberly Waters MRN: [id number], DOB/AGE: [birth year] 76 y.o. Admit date: 11/13/2024 D/C date:     11/19/2024   Primary Discharge Diagnoses:  OOH Cardiac Arrest D/t PMVT  Prolonged QTc Acute on Chronic Systolic Heart Failure Hypothyroidism  Headache   Hospital Course:   Kimberly Waters is a 78 y.o. female with history of breast cancer status post mastectomy, chemo and radiation, COPD, tobacco use, nonobstructive CAD, systolic HF (EF 40-45% on cMRI), prior history of polymorphic VT in the setting of prolonged QTc and hypothyroidism admitted for OOH arrest d/t PMVT.   She had witnessed syncope and reported loss of pulses per family. Received ~2 min of CPR. On arrival to the ED, she was having episodes of sustained polymorphic VT with R on T. QTc prolonged. Initially given IV amio but transitioned to lidocaine  gtt.   Echo showed EF 30-35% (previously 40-45%). Of note, she had a LHC in 2024 that showed no CAD and cMRI also in 2024 that showed no LGE, normal ECV% and T2 values. EF 40-45%, RV nl.  She was also markedly hypothyroid, after reporting that her home levothyroxine  dose had been decreased. TSH was elevated at 31.6 and Free T4 was low at 0.60. Levothyroxine  was increased from 100>>125 mcg.   She was monitored in the CCU. She had no further VT. Lidocaine  gtt discontinued and she was transitioned to Mexiletine 150 mg bid.  She did have frequent couple PVCs and EP recommended placement of ICD. Underwent BSCI ICD by Dr. Almetta on 11/24. Did well post procedure other than some pocket soreness. HF GDMT titrated. Had some trouble with ongoing fatigue and dizziness. Seen by PT and felt to be appropriate for home health PT. Services were arranged.  Follow-up in HF and EP clinics arranged.  Discharge Vitals: Blood pressure (!) 167/79, pulse 82, temperature 98.4 F (36.9 C), temperature source Oral, resp. rate 16, height 5' (1.524 m),  weight 88.6 kg, SpO2 94%.  Labs: Lab Results  Component Value Date   WBC 9.5 11/19/2024   HGB 12.4 11/19/2024   HCT 38.5 11/19/2024   MCV 93.0 11/19/2024   PLT 272 11/19/2024    Recent Labs  Lab 11/15/24 0039 11/17/24 0141 11/19/24 0805  NA 138   < > 136  K 4.2   < > 3.8  CL 102   < > 98  CO2 26   < > 26  BUN 18   < > 10  CREATININE 1.50*   < > 1.22*  CALCIUM  8.5*   < > 9.3  PROT 6.4*  --   --   BILITOT 0.5  --   --   ALKPHOS 34*  --   --   ALT 19  --   --   AST 21  --   --   GLUCOSE 99   < > 115*   < > = values in this interval not displayed.   Lab Results  Component Value Date   CHOL 158 08/25/2020   HDL 39 (L) 08/25/2020   LDLCALC 78 08/25/2020   TRIG 250 (H) 08/25/2020   BNP (last 3 results) Recent Labs    11/14/24 0216  BNP 968.6*    ProBNP (last 3 results) Recent Labs    10/16/24 1818  PROBNP 847.0*     Diagnostic Studies/Procedures   Echo 11/14/24 IMPRESSIONS   1. Endocardium not well visualized. No definity given rib artifact in all  apical views. Left ventricular ejection fraction, by estimation, is 30 to  35%. The left ventricle has moderately decreased function. The left  ventricle demonstrates global  hypokinesis. The left ventricular internal cavity size was moderately  dilated. Left ventricular diastolic parameters are indeterminate.   2. Right ventricular systolic function is normal. The right ventricular  size is normal.   3. Left atrial size was moderately dilated.   4. The mitral valve is abnormal. Mild mitral valve regurgitation. No  evidence of mitral stenosis.   5. The aortic valve is tricuspid. There is mild calcification of the  aortic valve. Aortic valve regurgitation is not visualized. Aortic valve  sclerosis is present, with no evidence of aortic valve stenosis.   6. The inferior vena cava is normal in size with greater than 50%  respiratory variability, suggesting right atrial pressure of 3 mmHg.   Discharge  Medications   Allergies as of 11/19/2024       Reactions   Latex Other (See Comments)   Tears skin.   Neurontin  [gabapentin ] Other (See Comments)   Memory loss Confusion    Norvasc  [amlodipine ] Other (See Comments)   Peripheral neuropathy   Adhesive [tape] Other (See Comments)   (skin tears)  Tolerates PAPER TAPE   Codeine  Nausea Only   Other Nausea Only   Anesthesia--nausea         Medication List     STOP taking these medications    diphenhydramine -acetaminophen  25-500 MG Tabs tablet Commonly known as: TYLENOL  PM   meloxicam 7.5 MG tablet Commonly known as: MOBIC   valsartan  80 MG tablet Commonly known as: DIOVAN        TAKE these medications    acetaminophen  500 MG tablet Commonly known as: TYLENOL  Take 1,000 mg by mouth 2 (two) times daily as needed for headache or fever (pain).   albuterol  90 MCG/ACT inhaler Commonly known as: PROVENTIL ,VENTOLIN  Inhale 2 puffs into the lungs every 4 (four) hours as needed.   budesonide -formoterol  80-4.5 MCG/ACT inhaler Commonly known as: SYMBICORT  Inhale 2 puffs into the lungs 2 (two) times daily.   buPROPion  150 MG 12 hr tablet Commonly known as: WELLBUTRIN  SR Take 150 mg by mouth daily.   carvedilol  3.125 MG tablet Commonly known as: COREG  Take 1 tablet (3.125 mg total) by mouth 2 (two) times daily with a meal. What changed: when to take this   escitalopram  10 MG tablet Commonly known as: LEXAPRO  Take 1 tablet (10 mg total) by mouth daily.   ezetimibe  10 MG tablet Commonly known as: ZETIA  Take 1 tablet (10 mg total) by mouth daily.   furosemide  40 MG tablet Commonly known as: Lasix  Take 1 tablet (40 mg total) by mouth daily as needed (for weight gain > 3 lb in a day or 5 lb in a week or worsening leg edema, shortness of breath). What changed: reasons to take this   levothyroxine  125 MCG tablet Commonly known as: SYNTHROID  Take 1 tablet (125 mcg total) by mouth daily at 6 (six) AM. Start taking on:  November 20, 2024 What changed:  medication strength how much to take when to take this   mexiletine 150 MG capsule Commonly known as: MEXITIL  Take 1 capsule (150 mg total) by mouth every 12 (twelve) hours.   nitroGLYCERIN  0.4 MG SL tablet Commonly known as: NITROSTAT  Place 0.4 mg under the tongue every 5 (five) minutes as needed for chest pain.   omeprazole  20 MG capsule Commonly known as: PRILOSEC Take 20 mg by mouth  daily.   potassium chloride  SA 20 MEQ tablet Commonly known as: KLOR-CON  M Take 2 tablets (40 mEq total) by mouth daily as needed (when taking lasix ). What changed:  when to take this reasons to take this   rosuvastatin  20 MG tablet Commonly known as: CRESTOR  Take 1 tablet (20 mg total) by mouth daily.   sacubitril -valsartan  49-51 MG Commonly known as: ENTRESTO  Take 1 tablet by mouth 2 (two) times daily.   spironolactone  25 MG tablet Commonly known as: ALDACTONE  Take 1 tablet (25 mg total) by mouth daily. Start taking on: November 20, 2024               Durable Medical Equipment  (From admission, onward)           Start     Ordered   11/18/24 1628  For home use only DME lightweight manual wheelchair with seat cushion  Once       Comments: Patient suffers from physical deconditioning, heart failure, hx peripheral neuropathy which impairs their ability to perform daily activities like bathing, dressing, grooming, and toileting in the home.  A cane, crutch, or walker will not resolve  issue with performing activities of daily living. A wheelchair will allow patient to safely perform daily activities. Patient is not able to propel themselves in the home using a standard weight wheelchair due to general weakness. Patient can self propel in the lightweight wheelchair. Length of need Lifetime. Accessories: elevating leg rests (ELRs), wheel locks, extensions and anti-tippers, back cushion.   11/18/24 1627   11/18/24 1628  For home use only DME 3 n 1   Once        11/18/24 1627            Disposition   The patient will be discharged in stable condition to home. Discharge Instructions     Diet - low sodium heart healthy   Complete by: As directed    Heart Failure patients record your daily weight using the same scale at the same time of day   Complete by: As directed    Increase activity slowly   Complete by: As directed    STOP any activity that causes chest pain, shortness of breath, dizziness, sweating, or exessive weakness   Complete by: As directed        Contact information for follow-up providers     Dwight Trula SQUIBB, MD Follow up.   Specialty: Internal Medicine Why: office will schedule hospital follow up appt Contact information: [address] [place] [address] [phone]         [facility] Follow up.   Specialty: Home Health Services Why: Home Health Agency will call to arrange appts Contact information: [address] [place] [address] [phone]         [facility] Follow up on 11/27/2024.   Specialty: Cardiology Why: Advanced Heart Failure clinic 9 am Entrance C, Free Valet Parking Contact information: [address] [place]  [phone]             Contact information for after-discharge care     Home Medical Care     [facility]) .   Service: Home Health Services Contact information: [address] [place]  [address] [phone]  Duration of Discharge Encounter: Greater than 35 minutes   Signed, [facility], LINDSAY N  11/19/2024, 2:23 PM   Agree with above.  She is ready for d/c/ Please see my full note from this am.   MD discharge time 38 mins.   Toribio Fuel, MD  6:36 PM

## 2024-11-17 NOTE — H&P (View-Only) (Signed)
 Progress Note  Patient Name: Kimberly Waters Date of Encounter: 11/17/2024  Primary Cardiologist: Oneil Parchment, MD   Patient ID  78 y.o. female with a history of breast cancer status post mastectomy, chemo and radiation, COPD, tobacco use, nonobstructive CAD, prior history of polymorphic VT in the setting of prolonged QTc and hypothyroidism who is being followed for syncope/SCD 2/2 PMVT.   Subjective   No overnight events.  She was a little bit sad this morning after thinking about her husband passing so unexpectedly.   She is having infrequent but some closely couple PVCs that could initiate recurrent polymorphic VT.  Plan to start mexiletine this morning.  Potential add-on case later this afternoon.  N.p.o. other than clear liquids this morning.  Reviewed risk, benefits and alternatives again with patient.  She is agreeable to 3.  Inpatient Medications    Scheduled Meds:  Chlorhexidine  Gluconate Cloth  6 each Topical Daily   ezetimibe   10 mg Oral Daily   ipratropium  2 spray Nasal QID   irbesartan   150 mg Oral Daily   levothyroxine   125 mcg Oral Q0600   pantoprazole   20 mg Oral Daily   rosuvastatin   20 mg Oral Daily   spironolactone   25 mg Oral Daily   Continuous Infusions:  PRN Meds: acetaminophen , hydrALAZINE , LORazepam , mouth rinse, traMADol    Vital Signs    Vitals:   11/17/24 0100 11/17/24 0200 11/17/24 0300 11/17/24 0400  BP: (!) 152/60 (!) 166/61 (!) 167/60   Pulse: 75 73 73   Resp: (!) 23 (!) 23 17   Temp:    98.1 F (36.7 C)  TempSrc:    Oral  SpO2: 95% 96% 94%   Weight:      Height:        Intake/Output Summary (Last 24 hours) at 11/17/2024 0528 Last data filed at 11/17/2024 0400 Gross per 24 hour  Intake 381.18 ml  Output 1225 ml  Net -843.82 ml   Filed Weights   11/13/24 2155  Weight: 86.2 kg   Telemetry    NSR with infrequent PVCs, some closely coupled - Personally Reviewed  ECG    None today   Physical Exam   GEN: No acute distress.    Neck: No JVD Cardiac: RRR, no murmurs, rubs, or gallops.  Respiratory: Clear to auscultation bilaterally. GI: Soft, nontender, non-distended  MS: No edema; No deformity. Neuro:  Nonfocal  Psych: Normal affect   Labs    Chemistry Recent Labs  Lab 11/13/24 2143 11/13/24 2210 11/14/24 0842 11/15/24 0039 11/17/24 0141  NA 136   < > 135 138 135  K 3.0*   < > 4.0 4.2 4.2  CL 101   < > 100 102 98  CO2 23   < > 25 26 22   GLUCOSE 121*   < > 99 99 93  BUN 13   < > 14 18 11   CREATININE 1.15*   < > 1.39* 1.50* 1.06*  CALCIUM  8.8*   < > 8.7* 8.5* 8.9  PROT 6.6  --  6.7 6.4*  --   ALBUMIN 3.6  --  3.5 3.3*  --   AST 34  --  29 21  --   ALT 24  --  22 19  --   ALKPHOS 35*  --  32* 34*  --   BILITOT 0.4  --  0.7 0.5  --   GFRNONAA 49*   < > 39* 36* 54*  ANIONGAP 12   < >  10 10 15    < > = values in this interval not displayed.    Hematology Recent Labs  Lab 11/15/24 0039 11/16/24 0430 11/17/24 0141  WBC 9.3 10.0 10.0  RBC 3.58* 4.19 4.06  HGB 11.0* 12.6 12.2  HCT 34.5* 39.6 38.9  MCV 96.4 94.5 95.8  MCH 30.7 30.1 30.0  MCHC 31.9 31.8 31.4  RDW 13.5 13.4 13.4  PLT 254 310 310   BNP Recent Labs  Lab 11/14/24 0216  BNP 968.6*    Radiology   None today  Cardiac Studies   None today   Assessment & Plan  78 y.o. female with a history of breast cancer status post mastectomy, chemo and radiation, COPD, tobacco use, nonobstructive CAD, prior history of polymorphic VT in the setting of prolonged QTc and hypothyroidism who is being followed for syncope/SCD 2/2 PMVT. Now awaiting 2' prevention ICD implant.    PMVT SCD HFrEF/NICM  No overnight events. No recurrent PMVT.1 brief 4 beat run of NSVT. Having occasional closely couple PVCs that could initiate polymorphic VT.  Start mexiletine 150 mg twice daily.  Potential add-on later today for LEFT [place] 2' prevention ICD.  If able to get done today she could be a potential discharge later today versus tomorrow.  Reviewed risk  benefits alternatives of device implant again today and explained the role of mexiletine.  She is agreeable with both.  For questions or updates, please contact [facility] Please consult [email or web address] for contact info under Cardiology/STEMI.   Signed,  [name], MD [facility]  Cardiac Electrophysiology  11/17/2024, 5:28 AM

## 2024-11-17 NOTE — Plan of Care (Signed)
  Problem: Education: Goal: Knowledge of General Education information will improve Description: Including pain rating scale, medication(s)/side effects and non-pharmacologic comfort measures Outcome: Progressing   Problem: Health Behavior/Discharge Planning: Goal: Ability to manage health-related needs will improve Outcome: Progressing   Problem: Clinical Measurements: Goal: Ability to maintain clinical measurements within normal limits will improve Outcome: Progressing Goal: Will remain free from infection Outcome: Progressing Goal: Diagnostic test results will improve Outcome: Progressing Goal: Respiratory complications will improve Outcome: Progressing Goal: Cardiovascular complication will be avoided Outcome: Progressing   Problem: Education: Goal: Knowledge of cardiac device and self-care will improve Outcome: Progressing Goal: Ability to safely manage health related needs after discharge will improve Outcome: Progressing   Problem: Cardiac: Goal: Ability to achieve and maintain adequate cardiopulmonary perfusion will improve Outcome: Progressing   Problem: Education: Goal: Knowledge of cardiac device and self-care will improve Outcome: Progressing  Problem: Cardiac: Goal: Ability to achieve and maintain adequate cardiopulmonary perfusion will improve Outcome: Progressing

## 2024-11-17 NOTE — Interval H&P Note (Signed)
 History and Physical Interval Note:  11/17/2024 1:35 PM  Kimberly Waters  has presented today for surgery, with the diagnosis of heart block.  The various methods of treatment have been discussed with the patient and family. After consideration of risks, benefits and other options for treatment, the patient has consented to  Procedure(s): ICD IMPLANT (N/A) as a surgical intervention.  The patient's history has been reviewed, patient examined, no change in status, stable for surgery.  I have reviewed the patient's chart and labs.  Questions were answered to the patient's satisfaction.     Kimberly Waters

## 2024-11-17 NOTE — Progress Notes (Addendum)
 Advanced Heart Failure Rounding Note  Cardiologist: Oneil Parchment, MD  Chief Complaint: PMVT w/ Syncope OOH Cardiac Arrest   Patient Profile/HPI    Kimberly Waters is a 78 y.o. female with history of breast cancer status post mastectomy, chemo and radiation, COPD, tobacco use, nonobstructive CAD, systolic HF (EF 40-45% on cMRI), prior history of polymorphic VT in the setting of prolonged QTc and hypothyroidism who is being seen 11/13/2024 for the evaluation of syncope and polymorphic VT.   Witnessed syncope and reported loss of pulses per family. Received ~2 min of CPR   On arrival to the ED she was having episodes of sustained polymorphic VT with R on T.  QTc prolonged at 471. Initially given IV amio but transitioned to lidocaine  gtt.    Echo EF 30-35%  Subjective:    Off lidocaine / No recurrent VT. + PVCs  Plan for ICD today  No CP or SOB.    Objective:   Weight Range: 86.2 kg Body mass index is 37.11 kg/m.   Vital Signs:   Temp:  [97.3 F (36.3 C)-98.7 F (37.1 C)] 98.7 F (37.1 C) (11/24 0753) Pulse Rate:  [67-100] 81 (11/24 0830) Resp:  [15-26] 17 (11/24 0830) BP: (74-167)/(44-139) 124/63 (11/24 0800) SpO2:  [93 %-97 %] 95 % (11/24 0830) Last BM Date : 11/14/24  Weight change: Filed Weights   11/13/24 2155  Weight: 86.2 kg    Intake/Output:   Intake/Output Summary (Last 24 hours) at 11/17/2024 1030 Last data filed at 11/17/2024 0500 Gross per 24 hour  Intake 360 ml  Output 1225 ml  Net -865 ml      Physical Exam   GENERAL: NAD Lungs- clear  CARDIAC:  JVP not elevated.          Normal rate with regular rhythm. No MRG. No LEE  ABDOMEN: Soft, non-tender, non-distended.  EXTREMITIES: Warm and well perfused.  NEUROLOGIC: No obvious FND  Telemetry   NSR 80s w/ PVCs, no further VT Personally reviewed   Labs    CBC Recent Labs    11/16/24 0430 11/17/24 0141  WBC 10.0 10.0  HGB 12.6 12.2  HCT 39.6 38.9  MCV 94.5 95.8  PLT 310 310    Basic Metabolic Panel Recent Labs    88/77/74 0039 11/17/24 0141  NA 138 135  K 4.2 4.2  CL 102 98  CO2 26 22  GLUCOSE 99 93  BUN 18 11  CREATININE 1.50* 1.06*  CALCIUM  8.5* 8.9  MG 2.2  --    Liver Function Tests Recent Labs    11/15/24 0039  AST 21  ALT 19  ALKPHOS 34*  BILITOT 0.5  PROT 6.4*  ALBUMIN 3.3*   No results for input(s): LIPASE, AMYLASE in the last 72 hours. Cardiac Enzymes No results for input(s): CKTOTAL, CKMB, CKMBINDEX, TROPONINI in the last 72 hours.  BNP: BNP (last 3 results) Recent Labs    11/14/24 0216  BNP 968.6*    ProBNP (last 3 results) Recent Labs    10/16/24 1818  PROBNP 847.0*     D-Dimer No results for input(s): DDIMER in the last 72 hours. Hemoglobin A1C No results for input(s): HGBA1C in the last 72 hours. Fasting Lipid Panel No results for input(s): CHOL, HDL, LDLCALC, TRIG, CHOLHDL, LDLDIRECT in the last 72 hours. Thyroid  Function Tests No results for input(s): TSH, T4TOTAL, T3FREE, THYROIDAB in the last 72 hours.  Invalid input(s): FREET3   Other results:   Imaging  No results found.    Medications:     Scheduled Medications:  Chlorhexidine  Gluconate Cloth  6 each Topical Daily   ezetimibe   10 mg Oral Daily   gentamicin  (GARAMYCIN ) 80 mg in sodium chloride  0.9 % 500 mL irrigation  80 mg Irrigation On Call   ipratropium  2 spray Nasal QID   irbesartan   150 mg Oral Daily   levothyroxine   125 mcg Oral Q0600   mexiletine  150 mg Oral Q12H   pantoprazole   20 mg Oral Daily   rosuvastatin   20 mg Oral Daily   spironolactone   25 mg Oral Daily    Infusions:  sodium chloride  50 mL/hr at 11/17/24 9071    ceFAZolin  (ANCEF ) IV       PRN Medications: acetaminophen , hydrALAZINE , LORazepam , mouth rinse, traMADol , trimethobenzamide       Assessment/Plan   1. OOH Arrest D/t PMVT - reported loss of pulse in the field w/ 2 min of CPR - sustained polymorphic VT  with R on T on arrival.  QTc prolonged at 471. K 3.0, Mg 1.5 - now off lidocaine  gtt. On Mexiletine 150 mg bid. No further VT but c/w coupled PVCs - Echo EF 30-35%  - no indication for LHC. HS trop 81>>77. Cath in 2024 showed no CAD  - cMRI 2024 w/ no LGE. Normal ECV% and T2 values. EF 40-45%, RV nl  - EP planning ICD today  - Keep K > 4.0 Mg > 2.0   2. Hypokalemia/Hypomagnesemia - corrected w/ supplementation  - keep K > 4.0 and Mg > 2.0   3. Hypothyroidism - TSH 31.6, Free T4 0.60, Free T3 76 - on levothyroxine  100 mcg PTA but per pt report, she may not have been taking it consistently  - synthroid  increased to 125 mcg daily   4. Chronic Systolic Heart Failure - cMRI 7975 EF 40-45%, RV ok - Echo this admit EF 30-35% - BNP 968 on admission, CXR w/ mild pulmonary vascular congestion  - volume improved with lasix  - continue ibersartan 150 mg daily  - continue spironolactone  25 mg - plan ICD today   5. HTN - controlled - GDMT per above    Length of Stay: [address] PA-C  11/17/2024, 10:30 AM  Advanced Heart Failure Team Pager [phone] (M-F; 7a - 5p)  Please contact [facility] for night-coverage after hours (5p -7a ) and weekends on [email or web address]  Patient seen and examined with the above-signed Advanced Practice Provider and/or Housestaff. I personally reviewed laboratory data, imaging studies and relevant notes. I independently examined the patient and formulated the important aspects of the plan. I have edited the note to reflect any of my changes or salient points. I have personally discussed the plan with the patient and/or family.  Lidocaine  off since yesterday. No further VT. + PVCs  For ICD today   General:  Sitting up in bed. No resp difficulty HEENT: normal Neck: supple. no JVD.  Cor: Regular rate & rhythm. No rubs, gallops or murmurs. Lungs: clear Abdomen: soft, nontender, nondistended.Good bowel sounds. Extremities: no cyanosis, clubbing, rash,  edema Neuro: alert & orientedx3, cranial nerves grossly intact. moves all 4 extremities w/o difficulty. Affect pleasant  VT stable. Still with PVCs.   For ICD today. Continue to titrate GDMT.   If PVCs become more frequent consider mexilitene.  D/w EP.   Toribio Fuel, MD  11:32 AM

## 2024-11-18 ENCOUNTER — Inpatient Hospital Stay (HOSPITAL_COMMUNITY)

## 2024-11-18 ENCOUNTER — Encounter (HOSPITAL_COMMUNITY): Payer: Self-pay | Admitting: Student in an Organized Health Care Education/Training Program

## 2024-11-18 DIAGNOSIS — Z95810 Presence of automatic (implantable) cardiac defibrillator: Secondary | ICD-10-CM | POA: Diagnosis not present

## 2024-11-18 DIAGNOSIS — I10 Essential (primary) hypertension: Secondary | ICD-10-CM | POA: Diagnosis not present

## 2024-11-18 DIAGNOSIS — I469 Cardiac arrest, cause unspecified: Secondary | ICD-10-CM | POA: Diagnosis not present

## 2024-11-18 DIAGNOSIS — R519 Headache, unspecified: Secondary | ICD-10-CM | POA: Diagnosis not present

## 2024-11-18 DIAGNOSIS — J449 Chronic obstructive pulmonary disease, unspecified: Secondary | ICD-10-CM | POA: Diagnosis not present

## 2024-11-18 DIAGNOSIS — I472 Ventricular tachycardia, unspecified: Secondary | ICD-10-CM | POA: Diagnosis not present

## 2024-11-18 DIAGNOSIS — J90 Pleural effusion, not elsewhere classified: Secondary | ICD-10-CM | POA: Diagnosis not present

## 2024-11-18 DIAGNOSIS — I517 Cardiomegaly: Secondary | ICD-10-CM | POA: Diagnosis not present

## 2024-11-18 DIAGNOSIS — I5022 Chronic systolic (congestive) heart failure: Secondary | ICD-10-CM | POA: Diagnosis not present

## 2024-11-18 LAB — CBC
HCT: 38.6 % (ref 36.0–46.0)
Hemoglobin: 12.2 g/dL (ref 12.0–15.0)
MCH: 30 pg (ref 26.0–34.0)
MCHC: 31.6 g/dL (ref 30.0–36.0)
MCV: 94.8 fL (ref 80.0–100.0)
Platelets: 300 K/uL (ref 150–400)
RBC: 4.07 MIL/uL (ref 3.87–5.11)
RDW: 13.3 % (ref 11.5–15.5)
WBC: 8.4 K/uL (ref 4.0–10.5)
nRBC: 0 % (ref 0.0–0.2)

## 2024-11-18 LAB — BASIC METABOLIC PANEL WITH GFR
Anion gap: 12 (ref 5–15)
BUN: 10 mg/dL (ref 8–23)
CO2: 27 mmol/L (ref 22–32)
Calcium: 9.5 mg/dL (ref 8.9–10.3)
Chloride: 97 mmol/L — ABNORMAL LOW (ref 98–111)
Creatinine, Ser: 1.08 mg/dL — ABNORMAL HIGH (ref 0.44–1.00)
GFR, Estimated: 53 mL/min — ABNORMAL LOW (ref >60)
Glucose, Bld: 113 mg/dL — ABNORMAL HIGH (ref 70–99)
Potassium: 4 mmol/L (ref 3.5–5.1)
Sodium: 136 mmol/L (ref 135–145)

## 2024-11-18 LAB — LIDOCAINE LEVEL: Lidocaine Lvl: 1.9 ug/mL (ref 1.5–5.0)

## 2024-11-18 LAB — MAGNESIUM: Magnesium: 1.9 mg/dL (ref 1.7–2.4)

## 2024-11-18 MED ORDER — PANTOPRAZOLE SODIUM 40 MG PO TBEC
80.0000 mg | DELAYED_RELEASE_TABLET | Freq: Every day | ORAL | Status: DC
  Administered 2024-11-19: 80 mg via ORAL
  Filled 2024-11-18: qty 2

## 2024-11-18 MED ORDER — SACUBITRIL-VALSARTAN 24-26 MG PO TABS
1.0000 | ORAL_TABLET | Freq: Two times a day (BID) | ORAL | Status: DC
  Administered 2024-11-18: 1 via ORAL
  Filled 2024-11-18 (×3): qty 1

## 2024-11-18 MED ORDER — PANTOPRAZOLE SODIUM 40 MG PO TBEC
60.0000 mg | DELAYED_RELEASE_TABLET | Freq: Once | ORAL | Status: AC
  Administered 2024-11-18: 60 mg via ORAL
  Filled 2024-11-18: qty 1

## 2024-11-18 MED FILL — Lidocaine HCl Local Preservative Free (PF) Inj 1%: INTRAMUSCULAR | Qty: 30 | Status: AC

## 2024-11-18 MED FILL — Midazolam HCl Inj 2 MG/2ML (Base Equivalent): INTRAMUSCULAR | Qty: 1.5 | Status: AC

## 2024-11-18 NOTE — TOC CM/SW Note (Addendum)
 The patient has chronic heart failure and experiences frequent, urgent toileting needs. Because the bathroom is not attached to or in close proximity to the patient's bedroom, accessing it safely and in a timely manner is difficult. A 3-in-1 bedside commode is medically necessary to reduce fall risk, improve safety, and meet the patient's toileting need.   The patient has increased weakness and significant physical deconditioning, which limit safe and independent mobility within the home. A lightweight manual wheelchair is medically necessary to improve functional mobility, reduce fall risk, and allow the patient to safely navigate her home environment       Durable Medical Equipment  (From admission, onward)           Start     Ordered   11/18/24 0959  For home use only DME 3 n 1  Once        11/18/24 0959   11/18/24 0954  For home use only DME lightweight manual wheelchair with seat cushion  Once       Comments: Patient suffers from physical deconditioning, heart failure, hx peripheral neuropathy which impairs their ability to perform daily activities like bathing, dressing, grooming, and toileting in the home.  A cane, crutch, or walker will not resolve  issue with performing activities of daily living. A wheelchair will allow patient to safely perform daily activities. Patient is not able to propel themselves in the home using a standard weight wheelchair due to general weakness. Patient can self propel in the lightweight wheelchair. Length of need Lifetime. Accessories: elevating leg rests (ELRs), wheel locks, extensions and anti-tippers, back cushion.   11/18/24 0957   11/18/24 0953  For home use only DME 4 wheeled rolling walker with seat  Once       Question Answer Comment  Patient needs a walker to treat with the following condition Heart failure [facility])   Patient needs a walker to treat with the following condition Physical deconditioning   Patient needs a walker to treat with the  following condition Hx of peripheral neuropathy      11/18/24 0957

## 2024-11-18 NOTE — Progress Notes (Cosign Needed Addendum)
  Patient Name: Kimberly Waters Date of Encounter: 11/18/2024  Primary Cardiologist: Oneil Parchment, MD Electrophysiologist: None  Interval Summary   No new complaints, No further arrhythmia  Vital Signs    Vitals:   11/18/24 0405 11/18/24 0513 11/18/24 0600 11/18/24 0700  BP: (!) 130/112 (!) 161/62 (!) 117/101   Pulse:      Resp:      Temp:    (!) 97 F (36.1 C)  TempSrc:    Axillary  SpO2:      Weight:    88.6 kg  Height:        Intake/Output Summary (Last 24 hours) at 11/18/2024 0814 Last data filed at 11/18/2024 0600 Gross per 24 hour  Intake 475.13 ml  Output 900 ml  Net -424.87 ml   Filed Weights   11/13/24 2155 11/18/24 0700  Weight: 86.2 kg 88.6 kg    Physical Exam    GEN- NAD, Alert and oriented  Lungs- Clear to ausculation bilaterally, normal work of breathing Cardiac- Regular rate and rhythm, no murmurs, rubs or gallops GI- soft, NT, ND, + BS Extremities- no clubbing or cyanosis. No edema  Telemetry    NSR 70-80s (personally reviewed)  Hospital Course    78 y.o. female with a history of breast cancer status post mastectomy, chemo and radiation, COPD, tobacco use, nonobstructive CAD, prior history of polymorphic VT in the setting of prolonged QTc and hypothyroidism who is being followed for syncope/SCD 2/2 PMVT. Now awaiting 2' prevention ICD implant.   Assessment & Plan    Chronic systolic CHF PMVT SCD S/p [employer] ICD CXR stable Interrogation stable. Wound care and restrictions with patient and caregiver. Usual follow up in place  Continue mexitil  150 mg BID.   For questions or updates, please contact [facility] Please consult [email or web address] for contact info under     Signed, Ozell Prentice Passey, PA-C  11/18/2024, 8:14 AM

## 2024-11-18 NOTE — Plan of Care (Signed)
  Problem: Education: Goal: Knowledge of General Education information will improve Description: Including pain rating scale, medication(s)/side effects and non-pharmacologic comfort measures Outcome: Progressing   Problem: Health Behavior/Discharge Planning: Goal: Ability to manage health-related needs will improve Outcome: Progressing   Problem: Clinical Measurements: Goal: Ability to maintain clinical measurements within normal limits will improve Outcome: Progressing Goal: Will remain free from infection Outcome: Progressing Goal: Diagnostic test results will improve Outcome: Progressing Goal: Respiratory complications will improve Outcome: Progressing Goal: Cardiovascular complication will be avoided Outcome: Progressing   Problem: Activity: Goal: Risk for activity intolerance will decrease Outcome: Progressing   Problem: Coping: Goal: Level of anxiety will decrease Outcome: Progressing   Problem: Elimination: Goal: Will not experience complications related to bowel motility Outcome: Progressing Goal: Will not experience complications related to urinary retention Outcome: Progressing   Problem: Pain Managment: Goal: General experience of comfort will improve and/or be controlled Outcome: Progressing   Problem: Safety: Goal: Ability to remain free from injury will improve Outcome: Progressing   Problem: Skin Integrity: Goal: Risk for impaired skin integrity will decrease Outcome: Progressing   Problem: Education: Goal: Knowledge of cardiac device and self-care will improve Outcome: Progressing Goal: Ability to safely manage health related needs after discharge will improve Outcome: Progressing Goal: Individualized Educational Video(s) Outcome: Progressing   Problem: Cardiac: Goal: Ability to achieve and maintain adequate cardiopulmonary perfusion will improve Outcome: Progressing   Problem: Education: Goal: Knowledge of cardiac device and self-care will  improve Outcome: Progressing Goal: Ability to safely manage health related needs after discharge will improve Outcome: Progressing Goal: Individualized Educational Video(s) Outcome: Progressing

## 2024-11-18 NOTE — Plan of Care (Signed)

## 2024-11-18 NOTE — Progress Notes (Addendum)
 Advanced Heart Failure Rounding Note  Cardiologist: Oneil Parchment, MD  Chief Complaint: PMVT w/ Syncope OOH Cardiac Arrest   Patient Profile/HPI    Kimberly Waters is a 78 y.o. female with history of breast cancer status post mastectomy, chemo and radiation, COPD, tobacco use, nonobstructive CAD, systolic HF (EF 40-45% on cMRI), prior history of polymorphic VT in the setting of prolonged QTc and hypothyroidism who is being seen 11/13/2024 for the evaluation of syncope and polymorphic VT.   Witnessed syncope and reported loss of pulses per family. Received ~2 min of CPR   On arrival to the ED she was having episodes of sustained polymorphic VT with R on T.  QTc prolonged at 471. Initially given IV amio but transitioned to lidocaine  gtt.    Echo EF 30-35%  Subjective:    ICD placed yesterday. CXR no PTX.   Complaining of HA and dizziness w/ standing. Also felt very weak when trying to walk this morning.   Complaining of acid reflux.   Objective:   Weight Range: 88.6 kg Body mass index is 38.15 kg/m.   Vital Signs:   Temp:  [97 F (36.1 C)-98.2 F (36.8 C)] 97 F (36.1 C) (11/25 0700) Pulse Rate:  [0-90] 72 (11/25 0730) Resp:  [13-43] 25 (11/25 0915) BP: (107-208)/(42-186) 117/89 (11/25 0900) SpO2:  [90 %-99 %] 92 % (11/25 0730) Weight:  [88.6 kg] 88.6 kg (11/25 0700) Last BM Date : 11/16/24  Weight change: Filed Weights   11/13/24 2155 11/18/24 0700  Weight: 86.2 kg 88.6 kg    Intake/Output:   Intake/Output Summary (Last 24 hours) at 11/18/2024 9062 Last data filed at 11/18/2024 0800 Gross per 24 hour  Intake 595.13 ml  Output 850 ml  Net -254.87 ml      Physical Exam   GENERAL: fatigued appearing, NAD Lungs- clear  CARDIAC:  JVP not elevated.          Normal rate with regular rhythm. No MRG, no LEE. Device pocket stable. No hematoma  ABDOMEN: Soft, non-tender, non-distended.  EXTREMITIES: Warm and well perfused.  NEUROLOGIC: No obvious  FND  Telemetry   NSR 70s. No further VT. Personally reviewed   Labs    CBC Recent Labs    11/17/24 0141 11/18/24 0409  WBC 10.0 8.4  HGB 12.2 12.2  HCT 38.9 38.6  MCV 95.8 94.8  PLT 310 300   Basic Metabolic Panel Recent Labs    88/75/74 0141  NA 135  K 4.2  CL 98  CO2 22  GLUCOSE 93  BUN 11  CREATININE 1.06*  CALCIUM  8.9   Liver Function Tests No results for input(s): AST, ALT, ALKPHOS, BILITOT, PROT, ALBUMIN in the last 72 hours.  No results for input(s): LIPASE, AMYLASE in the last 72 hours. Cardiac Enzymes No results for input(s): CKTOTAL, CKMB, CKMBINDEX, TROPONINI in the last 72 hours.  BNP: BNP (last 3 results) Recent Labs    11/14/24 0216  BNP 968.6*    ProBNP (last 3 results) Recent Labs    10/16/24 1818  PROBNP 847.0*     D-Dimer No results for input(s): DDIMER in the last 72 hours. Hemoglobin A1C No results for input(s): HGBA1C in the last 72 hours. Fasting Lipid Panel No results for input(s): CHOL, HDL, LDLCALC, TRIG, CHOLHDL, LDLDIRECT in the last 72 hours. Thyroid  Function Tests No results for input(s): TSH, T4TOTAL, T3FREE, THYROIDAB in the last 72 hours.  Invalid input(s): FREET3   Other results:   Imaging  DG Chest 2 View Result Date: 11/18/2024 EXAM: 2 VIEW(S) XRAY OF THE CHEST 11/18/2024 04:46:00 AM COMPARISON: Portable chest 11/13/2024. CLINICAL HISTORY: [id number] ICD (implantable cardioverter-defibrillator) in place. FINDINGS: LINES, TUBES AND DEVICES: Interval new single lead AICD left chest with right ventricular wire insertion. LUNGS AND PLEURA: Mildly emphysematous lungs. Minimal  pleural effusions. No pneumothorax. HEART AND MEDIASTINUM: Mild cardiomegaly. New single lead AICD in the left chest with right ventricular wire insertion. Slightly prominent central vessels. No overt edema. The mediastinum is stable. Calcification in the transverse aorta. BONES AND SOFT  TISSUES: Osteopenia and thoracic kyphosis with spondylosis. IMPRESSION: 1. Interval new single-lead AICD in the left chest with right ventricular lead placement, without pneumothorax. 2. Mild cardiomegaly. 3. Minimal  bilateral pleural effusions. 4. Slightly prominent central vessels without overt edema. 5. COPD. Electronically signed by: Francis Quam MD 11/18/2024 06:21 AM EST RP Workstation: [id number]   EP PPM/ICD IMPLANT Result Date: 11/17/2024 Summary: 1. Successful implant of a LEFT sided BSCI [place] VVI ICD Recommendations: 1. Routine post-procedure care with bedrest for 3 hours 2. No heparin  (IV or subcutaneous) for 48 hours. No enoxaparin  (IV or subcutaneous) for 7 days. 3. PA/lateral CXR in AM 4. Wound check in AM 5. Device interrogation in AM [name], MD [facility] Cardiac Electrophysiology       Medications:     Scheduled Medications:  Chlorhexidine  Gluconate Cloth  6 each Topical Daily   ezetimibe   10 mg Oral Daily   ipratropium  2 spray Nasal QID   irbesartan   150 mg Oral Daily   levothyroxine   125 mcg Oral Q0600   mexiletine  150 mg Oral Q12H   pantoprazole   20 mg Oral Daily   rosuvastatin   20 mg Oral Daily   spironolactone   25 mg Oral Daily    Infusions:    PRN Medications: acetaminophen , calcium  carbonate, hydrALAZINE , LORazepam , mouth rinse, traMADol , trimethobenzamide       Assessment/Plan   1. OOH Arrest D/t PMVT - reported loss of pulse in the field w/ 2 min of CPR - sustained polymorphic VT with R on T on arrival.  QTc prolonged at 471. K 3.0, Mg 1.5 - now off lidocaine  gtt. On Mexiletine 150 mg bid. No further VT  - Echo EF 30-35%  - no indication for LHC. HS trop 81>>77. Cath in 2024 showed no CAD  - cMRI 2024 w/ no LGE. Normal ECV% and T2 values. EF 40-45%, RV nl  - ICD placed yesterday, device interrogation shows proper fx. CXR ok.    2. Hypokalemia/Hypomagnesemia - corrected w/ supplementation  - K 4.0, Mg 1.9 today     3. Hypothyroidism - TSH 31.6, Free T4 0.60, Free T3 76 - on levothyroxine  100 mcg PTA but per pt report, she may not have been taking it consistently  - synthroid  increased to 125 mcg daily    4. Chronic Systolic Heart Failure - cMRI 7975 EF 40-45%, RV ok - Echo this admit EF 30-35% - BNP 968 on admission, CXR w/ mild pulmonary vascular congestion  - volume improved with IV lasix . Euvolemic. Hold further doses of lasix  for now.  - continue ibersartan 150 mg daily  - continue spironolactone  25 mg  5. HTN - controlled on current regimen  - GDMT per above   6. HA - persistent since admit - obtain head CT today   7. Positional Dizziness - she may be on the dry side - check orthostatics. If positive, will give bolus of IVFs -  head CT per above  8. Weakness/Deconditioning - consult PT/OT   Transfer to [facility] today.     Length of Stay: [address], [place]  11/18/2024, 9:37 AM  Advanced Heart Failure Team Pager [phone] (M-F; 7a - 5p)  Please contact [facility] for night-coverage after hours (5p -7a ) and weekends on [email or web address]  Patient seen and examined with the above-signed Advanced Practice Provider and/or Housestaff. I personally reviewed laboratory data, imaging studies and relevant notes. I independently examined the patient and formulated the important aspects of the plan. I have edited the note to reflect any of my changes or salient points. I have personally discussed the plan with the patient and/or family.  Underwent ICD placement yesterday. Doesn;t feel well today. Dizzy when sitting up. Weak. Still with ongoing HA  No further VT  General:  Sitting up in bed. No resp difficulty HEENT: normal Neck: supple. no JVD.   Cor: Regular rate & rhythm. No rubs, gallops or murmurs. ICD site ok  Lungs: clear Abdomen: obese soft, nontender, nondistended.Good bowel sounds. Extremities: no cyanosis, clubbing, rash, edema Neuro: alert & orientedx3, cranial nerves  grossly intact. moves all 4 extremities w/o difficulty. Affect pleasant  S/p ICD.  Feels weak. Now with some dizziness and persistent HA.   Will get head CT to ensure no bleed. Check orthostatcs and can give back fluid as needed.   Can go to tel but no discharge until more stable. PT/OT to see.   Toribio Fuel, MD  1:55 PM

## 2024-11-19 ENCOUNTER — Other Ambulatory Visit (HOSPITAL_COMMUNITY): Payer: Self-pay

## 2024-11-19 DIAGNOSIS — I5022 Chronic systolic (congestive) heart failure: Secondary | ICD-10-CM | POA: Diagnosis not present

## 2024-11-19 DIAGNOSIS — I469 Cardiac arrest, cause unspecified: Secondary | ICD-10-CM | POA: Diagnosis not present

## 2024-11-19 DIAGNOSIS — I10 Essential (primary) hypertension: Secondary | ICD-10-CM | POA: Diagnosis not present

## 2024-11-19 DIAGNOSIS — I472 Ventricular tachycardia, unspecified: Secondary | ICD-10-CM | POA: Diagnosis not present

## 2024-11-19 LAB — BASIC METABOLIC PANEL WITH GFR
Anion gap: 12 (ref 5–15)
BUN: 10 mg/dL (ref 8–23)
CO2: 26 mmol/L (ref 22–32)
Calcium: 9.3 mg/dL (ref 8.9–10.3)
Chloride: 98 mmol/L (ref 98–111)
Creatinine, Ser: 1.22 mg/dL — ABNORMAL HIGH (ref 0.44–1.00)
GFR, Estimated: 46 mL/min — ABNORMAL LOW (ref >60)
Glucose, Bld: 115 mg/dL — ABNORMAL HIGH (ref 70–99)
Potassium: 3.8 mmol/L (ref 3.5–5.1)
Sodium: 136 mmol/L (ref 135–145)

## 2024-11-19 LAB — CBC
HCT: 38.5 % (ref 36.0–46.0)
Hemoglobin: 12.4 g/dL (ref 12.0–15.0)
MCH: 30 pg (ref 26.0–34.0)
MCHC: 32.2 g/dL (ref 30.0–36.0)
MCV: 93 fL (ref 80.0–100.0)
Platelets: 272 K/uL (ref 150–400)
RBC: 4.14 MIL/uL (ref 3.87–5.11)
RDW: 13.2 % (ref 11.5–15.5)
WBC: 9.5 K/uL (ref 4.0–10.5)
nRBC: 0 % (ref 0.0–0.2)

## 2024-11-19 LAB — MAGNESIUM: Magnesium: 1.8 mg/dL (ref 1.7–2.4)

## 2024-11-19 MED ORDER — CARVEDILOL 3.125 MG PO TABS
3.1250 mg | ORAL_TABLET | Freq: Two times a day (BID) | ORAL | Status: DC
  Administered 2024-11-19: 3.125 mg via ORAL
  Filled 2024-11-19: qty 1

## 2024-11-19 MED ORDER — SPIRONOLACTONE 25 MG PO TABS
25.0000 mg | ORAL_TABLET | Freq: Every day | ORAL | 5 refills | Status: DC
  Filled 2024-11-19: qty 30, 30d supply, fill #0

## 2024-11-19 MED ORDER — POTASSIUM CHLORIDE CRYS ER 20 MEQ PO TBCR
40.0000 meq | EXTENDED_RELEASE_TABLET | Freq: Every day | ORAL | 1 refills | Status: DC | PRN
  Filled 2024-11-19: qty 60, 30d supply, fill #0

## 2024-11-19 MED ORDER — MEXILETINE HCL 150 MG PO CAPS
150.0000 mg | ORAL_CAPSULE | Freq: Two times a day (BID) | ORAL | 3 refills | Status: DC
  Filled 2024-11-19: qty 60, 30d supply, fill #0

## 2024-11-19 MED ORDER — EZETIMIBE 10 MG PO TABS
10.0000 mg | ORAL_TABLET | Freq: Every day | ORAL | 5 refills | Status: DC
  Filled 2024-11-19: qty 30, 30d supply, fill #0

## 2024-11-19 MED ORDER — SACUBITRIL-VALSARTAN 49-51 MG PO TABS
1.0000 | ORAL_TABLET | Freq: Two times a day (BID) | ORAL | 5 refills | Status: DC
  Filled 2024-11-19: qty 60, 30d supply, fill #0

## 2024-11-19 MED ORDER — POTASSIUM CHLORIDE CRYS ER 20 MEQ PO TBCR
40.0000 meq | EXTENDED_RELEASE_TABLET | Freq: Once | ORAL | Status: AC
  Administered 2024-11-19: 40 meq via ORAL
  Filled 2024-11-19: qty 2

## 2024-11-19 MED ORDER — SACUBITRIL-VALSARTAN 49-51 MG PO TABS
1.0000 | ORAL_TABLET | Freq: Two times a day (BID) | ORAL | Status: DC
  Administered 2024-11-19: 1 via ORAL
  Filled 2024-11-19 (×2): qty 1

## 2024-11-19 MED ORDER — MAGNESIUM SULFATE 2 GM/50ML IV SOLN
2.0000 g | Freq: Once | INTRAVENOUS | Status: AC
  Administered 2024-11-19: 2 g via INTRAVENOUS
  Filled 2024-11-19: qty 50

## 2024-11-19 MED ORDER — CARVEDILOL 3.125 MG PO TABS
3.1250 mg | ORAL_TABLET | Freq: Two times a day (BID) | ORAL | 5 refills | Status: DC
  Filled 2024-11-19: qty 60, 30d supply, fill #0

## 2024-11-19 MED ORDER — FUROSEMIDE 40 MG PO TABS
40.0000 mg | ORAL_TABLET | Freq: Every day | ORAL | 11 refills | Status: DC | PRN
  Filled 2024-11-19: qty 30, 30d supply, fill #0

## 2024-11-19 MED ORDER — LEVOTHYROXINE SODIUM 125 MCG PO TABS
125.0000 ug | ORAL_TABLET | Freq: Every day | ORAL | 1 refills | Status: DC
  Filled 2024-11-19: qty 30, 30d supply, fill #0

## 2024-11-19 NOTE — TOC Transition Note (Signed)
 Transition of Care [facility]) - Discharge Note   Patient Details  Name: Kimberly Waters MRN: [id number] Date of Birth: July 21, 1946  Transition of Care [facility]) CM/SW Contact:  Roxie [name], RN Phone Number: 11/19/2024, 3:22 PM   Clinical Narrative:    Patient stable for discharge.  [name] with [facility] aware of discharge.  Wheelchair and BSC to be delivered to room prior to dc.    Final next level of care: Home w Home Health Services Barriers to Discharge: Barriers Resolved   Patient Goals and CMS Choice Patient states their goals for this hospitalization and ongoing recovery are:: wants to remain independent          Discharge Placement                   home    Discharge Plan and Services Additional resources added to the After Visit Summary for     Discharge Planning Services: CM Consult            DME Arranged: Wheelchair manual, Bedside commode DME Agency: [employer] Date DME Agency Contacted: 11/19/24 Time DME Agency Contacted: [phone] Representative spoke with at DME Agency: [place] HH Arranged: PT, OT, RN HH Agency: [facility] Date [facility] Agency Contacted: 11/19/24 Time HH Agency Contacted: 1516 Representative spoke with at [facility] Agency: Darle[place]  Social Drivers of Health (SDOH) Interventions SDOH Screenings   Food Insecurity: No Food Insecurity (11/15/2024)  Housing: Low Risk  (11/15/2024)  Transportation Needs: Unmet Transportation Needs (11/15/2024)  Utilities: Not At Risk (11/15/2024)  Social Connections: Socially Isolated (11/15/2024)  Tobacco Use: Medium Risk (11/13/2024)     Readmission Risk Interventions    11/19/2024    3:22 PM  Readmission Risk Prevention Plan  Post Dischage Appt Complete  Transportation Screening Complete

## 2024-11-19 NOTE — Evaluation (Signed)
 Physical Therapy Brief Evaluation and Discharge Note Patient Details Name: Kimberly Waters MRN: [id number] DOB: Jun 08, 1946 Today's Date: 11/19/2024   History of Present Illness  Pt is a 78 y.o. female who presented due to witness syncope and loss of pulse and received CPR.  Pt was admitted due to ventricular tachycardia. Pt s/p ICD 11/17/24.[name] PMH: breast cancer s/p mastectomy, chemo and radiation, HLD, hypothyroidism, COPD, CHF, CKD III, nonobstructive CAD, systolic HF (EF 40-45% on cMRI)  Clinical Impression  Pt with excellent family support and plans to go home with daughter for the time being. Pt able to amb in hallway with rollator with CGA for safety. Good adherence to ICD precautions. From PT standpoint ready to go home with family and HHPT.        PT Assessment All further PT needs can be met in the next venue of care  Assistance Needed at Discharge  Frequent or constant Supervision/Assistance    Equipment Recommendations  (3-in-1 and w/c already ordered)  Recommendations for Other Services       Precautions/Restrictions Precautions Precautions: Fall Recall of Precautions/Restrictions: Intact Restrictions Other Position/Activity Restrictions: Pacer/ICD precautions        Mobility  Bed Mobility   Supine/Sidelying to sit: Supervision Sit to supine/sidelying: Supervision General bed mobility comments: Incr time. Good adherence to ICD precuations with LUE  Transfers Overall transfer level: Needs assistance Equipment used: Rollator (4 wheels) Transfers: Sit to/from Stand Sit to Stand: Min assist, Contact guard assist           General transfer comment: Initially min assist to power up. Improved to CGA with repitition    Ambulation/Gait Ambulation/Gait assistance: Contact guard assist Gait Distance (Feet): 140 Feet Assistive device: Rollator (4 wheels) Gait Pattern/deviations: Step-through pattern, Decreased stride length, Trunk flexed Gait Speed: Below  normal General Gait Details: Verbal cues to stay closer to rollator and stand more erect  Home Activity Instructions    Stairs            Modified Rankin (Stroke Patients Only)        Balance Overall balance assessment: Needs assistance Sitting-balance support: No upper extremity supported Sitting balance-Leahy Scale: Good     Standing balance support: During functional activity, Single extremity supported Standing balance-Leahy Scale: Poor Standing balance comment: UE support          Pertinent Vitals/Pain PT - Brief Vital Signs All Vital Signs Stable: Yes Pain Assessment Pain Assessment: No/denies pain     Home Living Family/patient expects to be discharged to:: Private residence Living Arrangements: Children Available Help at Discharge: Family;Available 24 hours/day     Home Equipment: Rollator (4 wheels)        Prior Function Level of Independence: Independent with assistive device(s) Comments: Has been using rollator    UE/LE Assessment   UE ROM/Strength/Tone/Coordination:  (Defer to OT)    LE ROM/Strength/Tone/Coordination: Generalized weakness      Communication   Communication Communication: Impaired Factors Affecting Communication: Hearing impaired     Cognition Overall Cognitive Status: Appears within functional limits for tasks assessed/performed       General Comments General comments (skin integrity, edema, etc.): VSS on RA    Exercises     Assessment/Plan    PT Problem List Decreased strength;Decreased mobility;Decreased balance       PT Visit Diagnosis Unsteadiness on feet (R26.81);Other abnormalities of gait and mobility (R26.89);Muscle weakness (generalized) (M62.81)    No Skilled PT     Co-evaluation  AMPAC 6 Clicks Help needed turning from your back to your side while in a flat bed without using bedrails?: A Little Help needed moving from lying on your back to sitting on the side of a  flat bed without using bedrails?: A Little Help needed moving to and from a bed to a chair (including a wheelchair)?: A Little Help needed standing up from a chair using your arms (e.g., wheelchair or bedside chair)?: A Little Help needed to walk in hospital room?: A Little Help needed climbing 3-5 steps with a railing? : A Little 6 Click Score: 18      End of Session Equipment Utilized During Treatment: Gait belt Activity Tolerance: Patient tolerated treatment well Patient left: in bed;with call bell/phone within reach;with bed alarm set;with family/visitor present Nurse Communication: Mobility status PT Visit Diagnosis: Unsteadiness on feet (R26.81);Other abnormalities of gait and mobility (R26.89);Muscle weakness (generalized) (M62.81)     Time: 8843-8777 PT Time Calculation (min) (ACUTE ONLY): 26 min  Charges:   PT Evaluation $PT Eval Low Complexity: 1 Low PT Treatments $Gait Training: 8-22 mins    [facility] PT Acute Rehabilitation Services Office [phone]   Rodgers ORN [facility]  11/19/2024, 12:34 PM

## 2024-11-19 NOTE — Evaluation (Signed)
 Occupational Therapy Evaluation Patient Details Name: Kimberly Waters MRN: [id number] DOB: Nov 18, 1946 Today's Date: 11/19/2024   History of Present Illness   Pt is a 78 y.o. female who presented due to witness syncope and loss of pulse and received CPR.  Pt was admitted due to ventricular tachycardia. Pt s/p ICD 11/17/24.Kimberly Waters PMH: breast cancer s/p mastectomy, chemo and radiation, HLD, hypothyroidism, COPD, CHF, CKD III, nonobstructive CAD, systolic HF (EF 40-45% on cMRI)     Clinical Impressions Pt at PLOF was living with husband (past away last Thursday) and using a 4WW. Pt and son reported the pt will be going to the family's house with 24/7 supervision with 3 step entrance. At this time pt was able to complete ambulation with supervision to CGA and was able to complete standing tasks with CGA at sink with cues on positioning. She then completed UE dressing with supervision while sitting at EOB. At this time recommendation for [facility] services with there return home.      If plan is discharge home, recommend the following:   A little help with walking and/or transfers;A little help with bathing/dressing/bathroom;Assistance with cooking/housework;Direct supervision/assist for medications management;Direct supervision/assist for financial management;Assist for transportation;Help with stairs or ramp for entrance     Functional Status Assessment   Patient has had a recent decline in their functional status and demonstrates the ability to make significant improvements in function in a reasonable and predictable amount of time.     Equipment Recommendations   BSC/3in1     Recommendations for Other Services         Precautions/Restrictions   Precautions Precautions: Fall Recall of Precautions/Restrictions: Intact Restrictions Weight Bearing Restrictions Per Provider Order: Yes LUE Weight Bearing Per Provider Order: Non weight bearing Other Position/Activity Restrictions: Pacer/ICD  precautions     Mobility Bed Mobility Overal bed mobility: Modified Independent             General bed mobility comments: HOB slightly elevated    Transfers Overall transfer level: Needs assistance Equipment used: Rolling walker (2 wheels) Transfers: Sit to/from Stand Sit to Stand: Contact guard assist                  Balance Overall balance assessment: Needs assistance Sitting-balance support: Feet supported Sitting balance-Leahy Scale: Good     Standing balance support: Bilateral upper extremity supported, Single extremity supported Standing balance-Leahy Scale: Fair Standing balance comment: UE support                           ADL either performed or assessed with clinical judgement   ADL Overall ADL's : Needs assistance/impaired Eating/Feeding: Independent;Sitting   Grooming: Wash/dry hands;Wash/dry face;Contact guard assist;Standing   Upper Body Bathing: Set up;Sitting   Lower Body Bathing: Contact guard assist;Sit to/from stand   Upper Body Dressing : Set up;Sitting   Lower Body Dressing: Contact guard assist;Sit to/from stand   Toilet Transfer: Contact guard assist   Toileting- Clothing Manipulation and Hygiene: Contact guard assist       Functional mobility during ADLs: Contact guard assist;Rolling walker (2 wheels)       Vision         Perception         Praxis         Pertinent Vitals/Pain Pain Assessment Pain Assessment: No/denies pain     Extremity/Trunk Assessment Upper Extremity Assessment Upper Extremity Assessment:  (s/p ICD)   Lower Extremity Assessment Lower Extremity  Assessment: Defer to PT evaluation       Communication Communication Communication: Impaired Factors Affecting Communication: Hearing impaired   Cognition Arousal: Alert Behavior During Therapy: WFL for tasks assessed/performed Cognition:  (further assessment as noted about having difficulties with management of meds)                                Following commands: Intact       Cueing  General Comments   Cueing Techniques: Verbal cues  VSS on RA   Exercises     Shoulder Instructions      Home Living Family/patient expects to be discharged to:: Private residence Living Arrangements: Children Available Help at Discharge: Available 24 hours/day Type of Home: House Home Access: Stairs to enter [employer] of Steps: 3 Entrance Stairs-Rails: Left;Right Home Layout: One level     Bathroom Shower/Tub: Producer, Television/film/video: Standard     Home Equipment: Rollator (4 wheels);Shower seat          Prior Functioning/Environment Prior Level of Function : Independent/Modified Independent             Mobility Comments: indep ADLs Comments: indep    OT Problem List: Decreased strength;Decreased range of motion;Decreased activity tolerance;Impaired balance (sitting and/or standing);Decreased knowledge of use of DME or AE;Pain   OT Treatment/Interventions: Self-care/ADL training;Therapeutic activities;Patient/family education;Balance training      OT Goals(Current goals can be found in the care plan section)   Acute Rehab OT Goals Patient Stated Goal: to go home OT Goal Formulation: With patient Time For Goal Achievement: 12/03/24 Potential to Achieve Goals: Fair   OT Frequency:  Min 2X/week    Co-evaluation              AM-PAC OT 6 Clicks Daily Activity     Outcome Measure Help from another person eating meals?: None Help from another person taking care of personal grooming?: None Help from another person toileting, which includes using toliet, bedpan, or urinal?: A Little Help from another person bathing (including washing, rinsing, drying)?: A Little Help from another person to put on and taking off regular upper body clothing?: None Help from another person to put on and taking off regular lower body clothing?: A Little 6 Click  Score: 21   End of Session Equipment Utilized During Treatment: Gait belt;Rolling walker (2 wheels) Nurse Communication: Mobility status  Activity Tolerance: Patient tolerated treatment well Patient left: in chair;with call bell/phone within reach;with chair alarm set  OT Visit Diagnosis: Unsteadiness on feet (R26.81);Other abnormalities of gait and mobility (R26.89);Muscle weakness (generalized) (M62.81);Pain Pain - Right/Left: Left Pain - part of body: Shoulder                Time: 1300-1355 OT Time Calculation (min): 55 min Charges:  OT General Charges $OT Visit: 1 Visit OT Evaluation $OT Eval Low Complexity: 1 Low OT Treatments $Self Care/Home Management : 38-52 mins  Warrick POUR OTR/L  Acute Rehab Services  [phone] office number   Warrick Berber 11/19/2024, 2:07 PM

## 2024-11-19 NOTE — Progress Notes (Signed)
 Advanced Heart Failure Rounding Note  Cardiologist: Oneil Parchment, MD  Chief Complaint: PMVT w/ Syncope OOH Cardiac Arrest   Patient Profile/HPI    Kimberly Waters is a 78 y.o. female with history of breast cancer status post mastectomy, chemo and radiation, COPD, tobacco use, nonobstructive CAD, systolic HF (EF 40-45% on cMRI), prior history of polymorphic VT in the setting of prolonged QTc and hypothyroidism who is being seen 11/13/2024 for the evaluation of syncope and polymorphic VT.   Witnessed syncope and reported loss of pulses per family. Received ~2 min of CPR   On arrival to the ED she was having episodes of sustained polymorphic VT with R on T.  QTc prolonged at 471. Initially given IV amio but transitioned to lidocaine  gtt.    Echo EF 30-35%  Subjective:    ICD placed 11/24  Feeling better today. No further dizziness.   Head CT ok  Sore at ICD site  Objective:   Weight Range: 88.6 kg Body mass index is 38.15 kg/m.   Vital Signs:   Temp:  [97 F (36.1 C)-98.5 F (36.9 C)] 98.4 F (36.9 C) (11/26 0747) Pulse Rate:  [61-92] 80 (11/26 0747) Resp:  [13-21] 16 (11/26 0747) BP: (134-206)/(65-126) 160/93 (11/26 0747) SpO2:  [84 %-98 %] 93 % (11/26 0402) Last BM Date : 11/16/24  Weight change: Filed Weights   11/13/24 2155 11/18/24 0700  Weight: 86.2 kg 88.6 kg    Intake/Output:   Intake/Output Summary (Last 24 hours) at 11/19/2024 0938 Last data filed at 11/19/2024 0404 Gross per 24 hour  Intake 120 ml  Output 900 ml  Net -780 ml      Physical Exam   General:  Sitting up in bed. No resp difficulty HEENT: normal Neck: supple. no JVD.  Cor: Regular rate & rhythm. No rubs, gallops or murmurs mild hematoma at ICD site. Lungs: clear Abdomen: soft, nontender, nondistended.Good bowel sounds. Extremities: no cyanosis, clubbing, rash, edema Neuro: alert & orientedx3, cranial nerves grossly intact. moves all 4 extremities w/o difficulty. Affect  pleasant   Telemetry   NSR 70-80s. No further VT. Personally reviewed   Labs    CBC Recent Labs    11/18/24 0409 11/19/24 0805  WBC 8.4 9.5  HGB 12.2 12.4  HCT 38.6 38.5  MCV 94.8 93.0  PLT 300 272   Basic Metabolic Panel Recent Labs    88/74/74 1005 11/19/24 0805  NA 136 136  K 4.0 3.8  CL 97* 98  CO2 27 26  GLUCOSE 113* 115*  BUN 10 10  CREATININE 1.08* 1.22*  CALCIUM  9.5 9.3  MG 1.9 1.8   Liver Function Tests No results for input(s): AST, ALT, ALKPHOS, BILITOT, PROT, ALBUMIN in the last 72 hours.  No results for input(s): LIPASE, AMYLASE in the last 72 hours. Cardiac Enzymes No results for input(s): CKTOTAL, CKMB, CKMBINDEX, TROPONINI in the last 72 hours.  BNP: BNP (last 3 results) Recent Labs    11/14/24 0216  BNP 968.6*    ProBNP (last 3 results) Recent Labs    10/16/24 1818  PROBNP 847.0*     D-Dimer No results for input(s): DDIMER in the last 72 hours. Hemoglobin A1C No results for input(s): HGBA1C in the last 72 hours. Fasting Lipid Panel No results for input(s): CHOL, HDL, LDLCALC, TRIG, CHOLHDL, LDLDIRECT in the last 72 hours. Thyroid  Function Tests No results for input(s): TSH, T4TOTAL, T3FREE, THYROIDAB in the last 72 hours.  Invalid input(s): FREET3  Other results:   Imaging    CT HEAD WO CONTRAST ( ) Result Date: 11/18/2024 EXAM: CT HEAD WITHOUT CONTRAST 11/18/2024 12:49:00 PM TECHNIQUE: CT of the head was performed without the administration of intravenous contrast. Automated exposure control, iterative reconstruction, and/or weight based adjustment of the mA/kV was utilized to reduce the radiation dose to as low as reasonably achievable. COMPARISON: CT head 10/16/2022 CLINICAL HISTORY: Headache, new onset (Age >= 51y) FINDINGS: BRAIN AND VENTRICLES: No acute hemorrhage. No evidence of acute infarct. No hydrocephalus. No extra-axial collection. No mass effect or midline  shift. ORBITS: No acute abnormality. SINUSES: No acute abnormality. SOFT TISSUES AND SKULL: No acute soft tissue abnormality. No skull fracture. IMPRESSION: 1. No acute intracranial abnormality. Electronically signed by: Gilmore Molt MD 11/18/2024 01:04 PM EST RP Workstation: [id number]      Medications:     Scheduled Medications:  Chlorhexidine  Gluconate Cloth  6 each Topical Daily   ezetimibe   10 mg Oral Daily   ipratropium  2 spray Nasal QID   levothyroxine   125 mcg Oral Q0600   mexiletine  150 mg Oral Q12H   pantoprazole   80 mg Oral Daily   rosuvastatin   20 mg Oral Daily   sacubitril -valsartan   1 tablet Oral BID   spironolactone   25 mg Oral Daily    Infusions:    PRN Medications: acetaminophen , calcium  carbonate, hydrALAZINE , LORazepam , mouth rinse, traMADol , trimethobenzamide       Assessment/Plan   1. OOH Arrest D/t PMVT - reported loss of pulse in the field w/ 2 min of CPR - sustained polymorphic VT with R on T on arrival.  QTc prolonged at 471. K 3.0, Mg 1.5 - now off lidocaine  gtt. On Mexiletine 150 mg bid. No further VT  - Echo EF 30-35%  - no indication for LHC. HS trop 81>>77. Cath in 2024 showed no CAD  - cMRI 2024 w/ no LGE. Normal ECV% and T2 values. EF 40-45%, RV nl  - ICD placed 11/24device interrogation shows proper fx. CXR ok.  - EP managing site   2. Hypokalemia/Hypomagnesemia - corrected w/ supplementation   3. Hypothyroidism - TSH 31.6, Free T4 0.60, Free T3 76 - on levothyroxine  100 mcg PTA but per pt report, she may not have been taking it consistently  - synthroid  increased to 125 mcg daily    4. Chronic Systolic Heart Failure - cMRI 7975 EF 40-45%, RV ok - Echo this admit EF 30-35% - BNP 968 on admission, CXR w/ mild pulmonary vascular congestion  - volume improved with IV lasix . Euvolemic. Hold further doses of lasix  for now.  - BP up. Increase Entresto  to 49/51 bid - continue spironolactone  25 mg - Restart low-dose carvedilol  -  avoid SGLT2i until more mobile  5. HTN - BP yp. GDMT as above  6. HA - persistent since admit - head CT ok   7. Weakness/Deconditioning - consult PT/OT - ok for d/c today if ok with PT/OT    Length of Stay: 6  Toribio Fuel, MD  11/19/2024, 9:38 AM  Advanced Heart Failure Team Pager [phone] (M-F; 7a - 5p)  Please contact [facility] for night-coverage after hours (5p -7a ) and weekends on [email or web address]

## 2024-11-19 NOTE — Progress Notes (Signed)
 OT Cancellation Note  Patient Details Name: Kimberly Waters MRN: [id number] DOB: November 29, 1946   Cancelled Treatment:    Reason Eval/Treat Not Completed: Medical issues which prohibited therapy (Per nursing feeling nauseous and to come back a little later. Will follow up as able.)  Warrick POUR OTR/L  Acute Rehab Services  [phone] office number   Warrick Berber 11/19/2024, 8:25 AM

## 2024-11-19 NOTE — Progress Notes (Signed)
 Went over AVS with patient/patient's son, answered any/all questions, IV removed, TOC meds and home equipment given, patient belongings gathered and patient was wheeled out to son's vehicle.

## 2024-11-25 ENCOUNTER — Telehealth: Payer: Self-pay

## 2024-11-25 NOTE — Progress Notes (Signed)
 ADVANCED HF CLINIC CONSULT NOTE  Referring Physician: Dwight Trula SQUIBB, MD Primary Care: Dwight Trula SQUIBB, MD Primary Cardiologist: Oneil Parchment, MD  Chief Complaint:  HPI: Kimberly Waters is a 78 y.o. female with history of breast cancer status post mastectomy, chemo and radiation, COPD, tobacco use, nonobstructive CAD, systolic HF (EF 40-45% on cMRI), prior history of polymorphic VT in the setting of prolonged QTc and hypothyroidism.   LHC 2024: no CAD. cMRI 2024 no LGE, normal ECV% and T2 values. EF 40-45%, RV nl.  Admitted 11/25 with OOHCA d/t PMVT.  She had witnessed syncope and reported loss of pulses per family. Received ~2 min of CPR. On arrival to the ED, she was having episodes of sustained polymorphic VT with R on T. QTc prolonged. Initially given IV amio but transitioned to lidocaine  gtt. Echo: EF 30-35% (previously 40-45%).  She was also markedly hypothyroid, after reporting that her home levothyroxine  dose had been decreased. TSH was elevated at 31.6 and Free T4 was low at 0.60. Levothyroxine  was increased from 100>>125 mcg. She was monitored in the CCU. She had no further VT. Lidocaine  gtt discontinued and she was transitioned to Mexiletine 150 mg bid.  She did have frequent couple PVCs and EP recommended placement of ICD. Underwent BSCI ICD by Dr. Almetta on 11/24. Did well post procedure other than some pocket soreness. HF GDMT titrated. Had some trouble with ongoing fatigue and dizziness. Seen by PT and felt to be appropriate for home health PT. Services were arranged. Stable at discharge with close follow ups.   Today she returns for post hospital follow up. Overall feeling ***. Denies palpitations, CP, dizziness, edema, or PND/Orthopnea. *** SOB. Appetite ok. No fever or chills. Weight at home *** pounds. Taking all medications. Denies ETOH, tobacco or drug use.    Past Medical History:  Diagnosis Date   Allergy    Breast cancer (HCC)    Colon polyps    Depression    Dyspnea     SEASONAL   Family history of breast cancer    Family history of colon cancer    GERD (gastroesophageal reflux disease)    History of radiation therapy 06/05/17-07/20/17   right chest wall 50.4 Gy in 28 fractions, right axillary nodal region 45 Gy in 25 fractions   Hyperlipidemia    Hypertension    Hypertensive retinopathy    Hypothyroidism    Macular degeneration    Peripheral neuropathy 06/18/2019   PONV (postoperative nausea and vomiting)     Current Outpatient Medications  Medication Sig Dispense Refill   acetaminophen  (TYLENOL ) 500 MG tablet Take 1,000 mg by mouth 2 (two) times daily as needed for headache or fever (pain).     albuterol  (PROVENTIL ,VENTOLIN ) 90 MCG/ACT inhaler Inhale 2 puffs into the lungs every 4 (four) hours as needed. 17 g 3   budesonide -formoterol  (SYMBICORT ) 80-4.5 MCG/ACT inhaler Inhale 2 puffs into the lungs 2 (two) times daily.     buPROPion  (WELLBUTRIN  SR) 150 MG 12 hr tablet Take 150 mg by mouth daily.      carvedilol  (COREG ) 3.125 MG tablet Take 1 tablet (3.125 mg total) by mouth 2 (two) times daily with a meal. 60 tablet 5   escitalopram  (LEXAPRO ) 10 MG tablet Take 1 tablet (10 mg total) by mouth daily. 30 tablet 0   ezetimibe  (ZETIA ) 10 MG tablet Take 1 tablet (10 mg total) by mouth daily. 30 tablet 5   furosemide  (LASIX ) 40 MG tablet Take 1 tablet (40  mg total) by mouth daily as needed (for weight gain > 3 lb in a day or 5 lb in a week or worsening leg edema, shortness of breath). 30 tablet 11   levothyroxine  (SYNTHROID ) 125 MCG tablet Take 1 tablet (125 mcg total) by mouth daily at 6 (six) AM. 30 tablet 1   mexiletine (MEXITIL ) 150 MG capsule Take 1 capsule (150 mg total) by mouth every 12 (twelve) hours. 60 capsule 3   nitroGLYCERIN  (NITROSTAT ) 0.4 MG SL tablet Place 0.4 mg under the tongue every 5 (five) minutes as needed for chest pain.     omeprazole  (PRILOSEC) 20 MG capsule Take 20 mg by mouth daily.     potassium chloride  SA (KLOR-CON  M) 20 MEQ  tablet Take 2 tablets (40 mEq total) by mouth daily as needed (when taking lasix ). 60 tablet 1   rosuvastatin  (CRESTOR ) 20 MG tablet Take 1 tablet (20 mg total) by mouth daily. 90 tablet 3   sacubitril -valsartan  (ENTRESTO ) 49-51 MG Take 1 tablet by mouth 2 (two) times daily. 60 tablet 5   spironolactone  (ALDACTONE ) 25 MG tablet Take 1 tablet (25 mg total) by mouth daily. 30 tablet 5   No current facility-administered medications for this visit.    Allergies  Allergen Reactions   Latex Other (See Comments)    Tears skin.   Neurontin  [Gabapentin ] Other (See Comments)    Memory loss Confusion    Norvasc  [Amlodipine ] Other (See Comments)    Peripheral neuropathy   Adhesive [Tape] Other (See Comments)    (skin tears)  Tolerates PAPER TAPE   Codeine  Nausea Only   Other Nausea Only    Anesthesia--nausea       Social History   Socioeconomic History   Marital status: Widowed    Spouse name: Not on file   Number of children: 2   Years of education: Not on file   Highest education level: Not on file  Occupational History   Occupation: reitred [employer]  Tobacco Use   Smoking status: Former    Current packs/day: 0.00    Average packs/day: 1 pack/day for 45.0 years (45.0 ttl pk-yrs)    Types: Cigarettes    Start date: 02/23/1972    Quit date: 02/22/2017    Years since quitting: 7.7   Smokeless tobacco: Never  Vaping Use   Vaping status: Never Used  Substance and Sexual Activity   Alcohol use: No    Alcohol/week: 0.0 standard drinks of alcohol   Drug use: No   Sexual activity: Not Currently  Other Topics Concern   Not on file  Social History Narrative   Not on file   Social Drivers of Health   Financial Resource Strain: Not on file  Food Insecurity: No Food Insecurity (11/15/2024)   Hunger Vital Sign    Worried About Running Out of Food in the Last Year: Never true    Ran Out of Food in the Last Year: Never true  Transportation Needs: Unmet Transportation  Needs (11/15/2024)   PRAPARE - Administrator, Civil Service (Medical): Yes    Lack of Transportation (Non-Medical): Yes  Physical Activity: Not on file  Stress: Not on file  Social Connections: Socially Isolated (11/15/2024)   Social Connection and Isolation Panel    Frequency of Communication with Friends and Family: More than three times a week    Frequency of Social Gatherings with Friends and Family: Three times a week    Attends Religious Services: Never  Active Member of Clubs or Organizations: No    Attends Banker Meetings: Never    Marital Status: Widowed  Intimate Partner Violence: Not At Risk (11/15/2024)   Humiliation, Afraid, Rape, and Kick questionnaire    Fear of Current or Ex-Partner: No    Emotionally Abused: No    Physically Abused: No    Sexually Abused: No      Family History  Problem Relation Age of Onset   Heart disease Mother 7       stents   Colon cancer Maternal Grandfather 59   Colon cancer Maternal Aunt 65   Colon cancer Maternal Uncle        dx in his 67s   Breast cancer Paternal Aunt 30   Breast cancer Cousin 36       paternal first cousin   Lung cancer Paternal Grandfather    Colon cancer Maternal Uncle    Head & neck cancer Maternal Uncle        oral cancer    There were no vitals filed for this visit.  PHYSICAL EXAM: General:  *** appearing.  No respiratory difficulty Neck: JVD *** cm.  Cor: Regular rate & rhythm. No murmurs. Lungs: clear Extremities: no edema  Neuro: alert & oriented x 3. Affect pleasant.   ECG: *** (Personally reviewed)     ASSESSMENT & PLAN: OOH Arrest D/t PMVT - reported loss of pulse in the field w/ 2 min of CPR. Sustained polymorphic VT with R on T on arrival.  QTc prolonged at 471. K 3.0, Mg 1.5.  - Continue Mexiletine 150 mg bid. No further VT *** - Echo EF 30-35%  - no indication for LHC. HS trop 81>>77. Cath in 2024 showed no CAD  - cMRI 2024 w/ no LGE. Normal ECV% and T2  values. EF 40-45%, RV nl  - ICD placed 11/24. Device interrogation today *** - EP following    Hypothyroidism - TSH 31.6, Free T4 0.60, Free T3 76 - Continue synthroid  recently increased to 125 mcg daily    Chronic Systolic Heart Failure - cMRI 7975 EF 40-45%, RV ok - Echo this admit EF 30-35% - NYHA *** - Volume *** - Continue Entresto  to 49/51 bid - continue spironolactone  25 mg - Continue carvedilol  *** - avoid SGLT2i until more mobile   HTN - BP ***. GDMT as above   Weakness/Deconditioning - OP PT/OT ***  Follow up ***  [facility]

## 2024-11-25 NOTE — Telephone Encounter (Signed)
 Contacted Kimberly Waters and she stated that [name] had given verbal earlier.

## 2024-11-25 NOTE — Telephone Encounter (Signed)
 Copied from CRM [id number]. Topic: Clinical - Home Health Verbal Orders >> Nov 25, 2024  9:41 AM Selinda RAMAN wrote: Caller/Agency: Manuelita Skilled Nurse with [facility] Callback Number: [phone] Service Requested: Skilled Nursing Frequency: 1 x a wk for 9 wks  Any new concerns about the patient? Yes as the patient was recently discharged from [facility] due to Heart Failure and she just wants to make sure she is followed up with  Manuelita states she knows the patient is not due to be seen for a week but after the appointment if she can get a call approving this service she would appreciate it. Please assist patient further   Called and LVM; returning call from previous message sent in by [facility] Nurse.

## 2024-11-25 NOTE — Telephone Encounter (Signed)
 Copied from CRM [id number]. Topic: Clinical - Home Health Verbal Orders >> Nov 25, 2024  9:41 AM Selinda RAMAN wrote: Caller/Agency: Manuelita Skilled Nurse with [facility] Callback Number: [phone] Service Requested: Skilled Nursing Frequency: 1 x a wk for 9 wks  Any new concerns about the patient? Yes as the patient was recently discharged from [facility] due to Heart Failure and she just wants to make sure she is followed up with  Manuelita states she knows the patient is not due to be seen for a week but after the appointment if she can get a call approving this service she would appreciate it. Please assist patient further

## 2024-11-26 ENCOUNTER — Telehealth: Payer: Self-pay

## 2024-11-26 NOTE — Progress Notes (Signed)
 [facility] - Clinic Note  11/28/2024     CHIEF COMPLAINT Patient presents for Retina Follow Up  HISTORY OF PRESENT ILLNESS: Kimberly Waters is a 78 y.o. female who presents to the clinic today for:  HPI     Retina Follow Up   Patient presents with  Wet AMD.  In right eye.  This started 4 weeks ago.  Duration of 4 weeks.  Since onset it is stable.  I, the attending physician,  performed the HPI with the patient and updated documentation appropriately.        Comments   4 week retina follow up ARMD and IVV OD pt states no vision changes noticed she passed out November 20th  was given CPR after she found husband deceased  she was in  ICU x 1 week  and had ICD implant put in she had a follow up yesterday and has fluid in her heart  pt states no vision changes noticed       Last edited by Valdemar Rogue, MD on 11/28/2024  4:25 PM.     Pt states since the last time she was here she discovered her husband passed away, had to be given CPR, was in ICU for a week. Pt had to have a defibrillator put in then a compressor for fluid.   Referring physician: Dwight Trula SQUIBB, MD [address] [place],  [place] [address]  HISTORICAL INFORMATION:   Selected notes from the MEDICAL RECORD NUMBER Referred by Dr. Ladora for eval of decreased VA OD.   CURRENT MEDICATIONS: No current outpatient medications on file. (Ophthalmic Drugs)   No current facility-administered medications for this visit. (Ophthalmic Drugs)   Current Outpatient Medications (Other)  Medication Sig   acetaminophen  (TYLENOL ) 500 MG tablet Take 1,000 mg by mouth 2 (two) times daily as needed for headache or fever (pain).   albuterol  (PROVENTIL ,VENTOLIN ) 90 MCG/ACT inhaler Inhale 2 puffs into the lungs every 4 (four) hours as needed.   nitroGLYCERIN  (NITROSTAT ) 0.4 MG SL tablet Place 0.4 mg under the tongue every 5 (five) minutes as needed for chest pain.   potassium chloride  SA (KLOR-CON  M) 20 MEQ  tablet Take 2 tablets (40 mEq total) by mouth daily as needed (when taking lasix ).   albuterol  (VENTOLIN  HFA) 108 (90 Base) MCG/ACT inhaler Inhale 2 puffs into the lungs every 6 (six) hours as needed for wheezing or shortness of breath.   buPROPion  (WELLBUTRIN  SR) 150 MG 12 hr tablet Take 1 tablet (150 mg total) by mouth daily.   carvedilol  (COREG ) 3.125 MG tablet Take 1 tablet (3.125 mg total) by mouth 2 (two) times daily with a meal.   ezetimibe  (ZETIA ) 10 MG tablet Take 1 tablet (10 mg total) by mouth daily.   Fluticasone -Umeclidin-Vilant (TRELEGY ELLIPTA ) 100-62.5-25 MCG/ACT AEPB Inhale 1 puff into the lungs daily.   furosemide  (LASIX ) 40 MG tablet Take 1 tablet (40 mg total) by mouth daily as needed (for weight gain > 3 lb in a day or 5 lb in a week or worsening leg edema, shortness of breath).   levothyroxine  (SYNTHROID ) 125 MCG tablet Take 1 tablet (125 mcg total) by mouth daily at 6 (six) AM.   mexiletine (MEXITIL ) 150 MG capsule Take 1 capsule (150 mg total) by mouth every 12 (twelve) hours.   omeprazole  (PRILOSEC) 20 MG capsule Take 1 capsule (20 mg total) by mouth daily.   rosuvastatin  (CRESTOR ) 20 MG tablet Take 1 tablet (20 mg  total) by mouth daily.   sacubitril -valsartan  (ENTRESTO ) 97-103 MG Take 1 tablet by mouth 2 (two) times daily.   spironolactone  (ALDACTONE ) 25 MG tablet Take 1 tablet (25 mg total) by mouth daily.   traZODone  (DESYREL ) 50 MG tablet Take 0.5 tablets (25 mg total) by mouth at bedtime as needed for sleep.   No current facility-administered medications for this visit. (Other)   REVIEW OF SYSTEMS: ROS   Positive for: Neurological, Eyes, Respiratory Negative for: Constitutional, Gastrointestinal, Skin, Genitourinary, Musculoskeletal, HENT, Endocrine, Cardiovascular, Psychiatric, Allergic/Imm, Heme/Lymph Last edited by Resa Delon ORN, COT on 11/28/2024 12:25 PM.     ALLERGIES Allergies  Allergen Reactions   Latex Other (See Comments)    Tears skin.    Neurontin  [Gabapentin ] Other (See Comments)    Memory loss Confusion    Norvasc  [Amlodipine ] Other (See Comments)    Peripheral neuropathy   Adhesive [Tape] Other (See Comments)    (skin tears)  Tolerates PAPER TAPE   Codeine  Nausea Only   Other Nausea Only    Anesthesia--nausea    PAST MEDICAL HISTORY Past Medical History:  Diagnosis Date   Allergy    Breast cancer (HCC)    Colon polyps    Depression    Dyspnea    SEASONAL   Family history of breast cancer    Family history of colon cancer    GERD (gastroesophageal reflux disease)    History of radiation therapy 06/05/17-07/20/17   right chest wall 50.4 Gy in 28 fractions, right axillary nodal region 45 Gy in 25 fractions   Hyperlipidemia    Hypertension    Hypertensive retinopathy    Hypothyroidism    Macular degeneration    Peripheral neuropathy 06/18/2019   PONV (postoperative nausea and vomiting)    Syncope 03/14/2023   Transient loss of consciousness 01/23/2023   Past Surgical History:  Procedure Laterality Date   ABDOMINAL HYSTERECTOMY     CATARACT EXTRACTION, BILATERAL Bilateral    Dr. [facility]   ICD IMPLANT N/A 11/17/2024   Procedure: ICD IMPLANT;  Surgeon: Almetta Donnice LABOR, MD;  Location: [facility] INVASIVE CV LAB;  Service: Cardiovascular;  Laterality: N/A;   KNEE SURGERY Right    LEFT HEART CATH AND CORONARY ANGIOGRAPHY N/A 02/02/2023   Procedure: LEFT HEART CATH AND CORONARY ANGIOGRAPHY;  Surgeon: Burnard Debby LABOR, MD;  Location: [facility] INVASIVE CV LAB;  Service: Cardiovascular;  Laterality: N/A;   MASTECTOMY W/ SENTINEL NODE BIOPSY Right 03/05/2017   Procedure: BILATERAL TOTAL MASTECTOMIES WITH RIGHT SENTINEL LYMPH NODE BIOPSIES;  Surgeon: Morene Olives, MD;  Location: [facility] OR;  Service: General;  Laterality: Right;   SHOULDER SURGERY     BILAT   FAMILY HISTORY Family History  Problem Relation Age of Onset   Heart disease Mother 4       stents   Colon cancer Maternal Grandfather 59    Colon cancer Maternal Aunt 65   Colon cancer Maternal Uncle        dx in his 2s   Breast cancer Paternal Aunt 23   Breast cancer Cousin 54       paternal first cousin   Lung cancer Paternal Grandfather    Colon cancer Maternal Uncle    Head & neck cancer Maternal Uncle        oral cancer   SOCIAL HISTORY Social History   Tobacco Use   Smoking status: Former    Current packs/day: 0.00    Average packs/day: 1 pack/day for 45.0 years (45.0  ttl pk-yrs)    Types: Cigarettes    Start date: 02/23/1972    Quit date: 02/22/2017    Years since quitting: 7.7   Smokeless tobacco: Never  Vaping Use   Vaping status: Never Used  Substance Use Topics   Alcohol use: No    Alcohol/week: 0.0 standard drinks of alcohol   Drug use: No       OPHTHALMIC EXAM: Base Eye Exam     Visual Acuity (Snellen - Linear)       Right Left   Dist cc 20/60 -3 CF at 3'   Dist ph cc NI NI         Tonometry (Tonopen, 12:34 PM)       Right Left   Pressure 15 17         Pupils       Pupils Dark Light Shape React APD   Right PERRL 3 2 Round Brisk None   Left PERRL 3 2 Round Brisk None         Visual Fields       Left Right    Full Full         Extraocular Movement       Right Left    Full, Ortho Full, Ortho         Neuro/Psych     Oriented x3: Yes   Mood/Affect: Normal         Dilation     Both eyes: 2.5% Phenylephrine  @ 12:34 PM           Slit Lamp and Fundus Exam     Slit Lamp Exam       Right Left   Lids/Lashes Dermatochalasis - upper lid Dermatochalasis - upper lid, mild MGD   Conjunctiva/Sclera nasal and temporal pinguecula nasal and temporal pinguecula   Cornea arcus, well healed cataract wound, 2-3+ Punctate epithelial erosions Trace Punctate epithelial erosions, arcus, well healed cataract wound   Anterior Chamber deep, narrow temporal angle, 0.5+ fine cell and pigment deep, narrow temporal angle   Iris Round and moderately dilated, mild anterior bowing  Round and moderately dilated, mild anterior bowing   Lens PC IOL in good position PC IOL in good position   Anterior Vitreous mild syneresis, Posterior vitreous detachment mild syneresis, Posterior vitreous detachment         Fundus Exam       Right Left   Disc Pink and Sharp, Compact, mild PPA Mild Pallor, Sharp rim, +elevation   C/D Ratio 0.0 0.1   Macula Blunted foveal reflex, +CNV with pigment clumping/ring, central IRH and SRF - stably improved, RPE mottling and clumping, Drusen, no heme, +subretinal fibrosis, trace cystic changes--slightly increased Blunted foveal reflex, large area of GA with disciform scar and subretinal fibrosis, central pigment clumping, punctate IRH IT mac within area of GA   Vessels attenuated, Tortuous attenuated, mild tortuosity, mild AV crossing changes   Periphery Attached, scattered reticular degeneration, paving stone degeneration, no heme Attached, scattered reticular degeneration, scattered paving stone degeneration, No heme           Refraction     Wearing Rx       Sphere Cylinder Axis   Right -6.50 +2.00 152   Left -5.25 +1.00 018    Type: SVL           IMAGING AND PROCEDURES  Imaging and Procedures for 11/28/2024  OCT, Retina - OU - Both Eyes       Right Eye  Quality was good. Central Foveal Thickness: 273. Progression has worsened. Findings include no IRF, no SRF, abnormal foveal contour, subretinal hyper-reflective material, choroidal neovascular membrane, intraretinal fluid, pigment epithelial detachment, subretinal fluid (persistent IRF / cystic changes overlying central SRHM/CNV--slightly increased).   Left Eye Quality was good. Central Foveal Thickness: 246. Progression has been stable. Findings include no IRF, no SRF, abnormal foveal contour, subretinal hyper-reflective material, lamellar hole, outer retinal atrophy (large central disciform scar with ORA, foveal notch, trace cystic changes and temporal edema -- all stable).    Notes *Images captured and stored on drive  Diagnosis / Impression:  exu ARMD OU OD: persistent IRF / cystic changes overlying central SRHM/CNV--slightly increased OS: large central disciform scar with ORA, Foveal notch, trace cystic changes and temporal edema -- all stable  Clinical management:  See below  Abbreviations: NFP - Normal foveal profile. CME - cystoid macular edema. PED - pigment epithelial detachment. IRF - intraretinal fluid. SRF - subretinal fluid. EZ - ellipsoid zone. ERM - epiretinal membrane. ORA - outer retinal atrophy. ORT - outer retinal tubulation. SRHM - subretinal hyper-reflective material. IRHM - intraretinal hyper-reflective material      Intravitreal Injection, Pharmacologic Agent - OD - Right Eye       Time Out 11/28/2024. 1:04 PM. Confirmed correct patient, procedure, site, and patient consented.   Anesthesia Topical anesthesia was used. Anesthetic medications included Lidocaine  2%, Proparacaine 0.5%.   Procedure Preparation included 5% betadine to ocular surface, eyelid speculum. A supplied (32g) needle was used.   Injection: 6 mg faricimab -svoa 6 MG/0.05ML   Route: Intravitreal, Site: Right Eye   NDC: 49757-903-93, Lot: [id number], Expiration date: 01/23/2026, Waste: 0 mL   Post-op Post injection exam found visual acuity of at least counting fingers. The patient tolerated the procedure well. There were no complications. The patient received written and verbal post procedure care education. Post injection medications were not given.            ASSESSMENT/PLAN:   ICD-10-CM   1. Exudative age-related macular degeneration of right eye with active choroidal neovascularization (HCC)  H35.3211 OCT, Retina - OU - Both Eyes    Intravitreal Injection, Pharmacologic Agent - OD - Right Eye    faricimab -svoa (VABYSMO ) 6mg /0.6mL intravitreal injection    2. Exudative age-related macular degeneration of left eye with active choroidal neovascularization  (HCC)  H35.3221     3. Essential hypertension  I10     4. Hypertensive retinopathy of both eyes  H35.033     5. Pseudophakia, both eyes  Z96.1      1. Exudative age related macular degeneration OD  - delayed f/u (11.20.24 to 01.06.25): 6.5 wks instead of 4 -- death in family - lost to follow up from 8 weeks to 7 months (12.19.23 to 07.02.24) due to cardiac issues -- vision decreased to 20/100 OD on 07.02.24  - pt initially presented w/ 2 wk history of decreased vision OD (12.21.2021) - history of intravitreal injections OS with Dr. Elner in 2017 and earlier -- pt reports stopping visits after the development of a corneal abrasion from a lid speculum. - s/p IVA OD #1 (12.21.21), #2 (01.24.22), #3 (2.21.22), #4 (03.22.22), #5 (4.26.22), #6 (5.31.22), #7 (7.12.22), #8 (8.16.22), #9 (9.20.22), #10 (10.25.22), #11 (12.6.22), #12 (01.10.23), #13 (02.14.23), #14 (03.21.23), #15 (04.25.23), #16 06.06.23), #17 (08.01.23), #18 (09.12.23), #19 (10.31.23), #20 (12.19.23) -- IVA resistance ==================  - s/p IVE OD #1 (07.02.24), #2 (07.30.24),#3 (08.27.24), #4 (09.25.24), #5 (10.23.24), #6 (11.20.24), #7 (01.06.25), #8 (  02.03.25), #9 (03.11.25) -- IVE resistance  ==================  - s/p IVV OD #1 (04.15.25), #2 (05.20.25), #3 (06.24.25), #4 (07.29.25), #5 (09.02.25) #6 (09.30.25) #7(11.07.25)  **history of increased SRF at 6 wk interval, noted on 12.6.22**  **history of increased SRF at 8 wk interval, noted on 08.01.23** - OCT shows persistent IRF / cystic changes overlying central SRHM/CNV--slightly increased at 4 weeks  - BCVA OD 20/60 from 20/50             - Recommend IVV OD #8 today, 12.05.25 w/ f/u in 5 wks due to holiday  - RBA of procedure discussed, questions answered  - IVE informed consent obtained and signed, 07.02.24 (OD)  - see procedure note  - f/u in 5 wks -- DFE/OCT, possible injection   2. Exudative age related macular degeneration, OS - OS with history inactive  disciform scar, but new focal IRH noted 5.31.22 -- stably resolved on exam today - s/p IVA OS #1 (05.31.22), #2 (07.12.22), #3 (8.15.22), #4 (9.20.22) for focal [facility] - history of intravitreal injections OS with Dr. Elner in 2017 and earlier -- pt reports stopping visits after the development of a corneal abrasion from a lid speculum  - before being referred here, had not seen a retina specialist since 2017  - exam and OCT OS shows large central disciform scar with ORA, Foveal notch, trace cystic changes and temporal edema -- all stable   - BCVA CF OS -- stable - recommend holding IVA OS today -- pt in agreement - f/u 5 weeks DFE, OCT  3,4. Hypertensive retinopathy OU - discussed importance of tight BP control - monitor  5. Pseudophakia OU  - s/p CE/IOL (Dr. Fleeta)  - IOL in good position, doing well  - monitor  Ophthalmic Meds Ordered this visit:  Meds ordered this encounter  Medications   faricimab -svoa (VABYSMO ) 6mg /0.57mL intravitreal injection     Return in about 5 weeks (around 01/02/2025) for exu ARMD OU, DFE, OCT, Possible Injxn.  There are no Patient Instructions on file for this visit.  This document serves as a record of services personally performed by Redell JUDITHANN Hans, MD, PhD. It was created on their behalf by Delon Newness COT, an ophthalmic technician. The creation of this record is the provider's dictation and/or activities during the visit.    Electronically signed by: Delon Newness COT 12.03.25  9:18 PM  This document serves as a record of services personally performed by Redell JUDITHANN Hans, MD, PhD. It was created on their behalf by Almetta Pesa, an ophthalmic technician. The creation of this record is the provider's dictation and/or activities during the visit.    Electronically signed by: Almetta Pesa, OA, 12/04/2024  9:18 PM  Redell JUDITHANN Hans, M.D., Ph.D. Diseases & Surgery of the Retina and Vitreous [facility] 11/28/2024    I have reviewed the above documentation for accuracy and completeness, and I agree with the above. Redell JUDITHANN Hans, M.D., Ph.D. 12/04/2024 9:20 PM    Abbreviations: M myopia (nearsighted); A astigmatism; H hyperopia (farsighted); P presbyopia; Mrx spectacle prescription;  CTL contact lenses; OD right eye; OS left eye; OU both eyes  XT exotropia; ET esotropia; PEK punctate epithelial keratitis; PEE punctate epithelial erosions; DES dry eye syndrome; MGD meibomian gland dysfunction; ATs artificial tears; PFAT's preservative free artificial tears; NSC nuclear sclerotic cataract; PSC posterior subcapsular cataract; ERM epi-retinal membrane; PVD posterior vitreous detachment; RD retinal detachment; DM diabetes mellitus; DR diabetic retinopathy; NPDR non-proliferative diabetic retinopathy;  PDR proliferative diabetic retinopathy; CSME clinically significant macular edema; DME diabetic macular edema; dbh dot blot hemorrhages; CWS cotton wool spot; POAG primary open angle glaucoma; C/D cup-to-disc ratio; HVF humphrey visual field; GVF goldmann visual field; OCT optical coherence tomography; IOP intraocular pressure; BRVO Branch retinal vein occlusion; CRVO central retinal vein occlusion; CRAO central retinal artery occlusion; BRAO branch retinal artery occlusion; RT retinal tear; SB scleral buckle; PPV pars plana vitrectomy; VH Vitreous hemorrhage; PRP panretinal laser photocoagulation; IVK intravitreal kenalog; VMT vitreomacular traction; MH Macular hole;  NVD neovascularization of the disc; NVE neovascularization elsewhere; AREDS age related eye disease study; ARMD age related macular degeneration; POAG primary open angle glaucoma; EBMD epithelial/anterior basement membrane dystrophy; ACIOL anterior chamber intraocular lens; IOL intraocular lens; PCIOL posterior chamber intraocular lens; Phaco/IOL phacoemulsification with intraocular lens placement; PRK photorefractive keratectomy; LASIK laser assisted in situ  keratomileusis; HTN hypertension; DM diabetes mellitus; COPD chronic obstructive pulmonary disease

## 2024-11-26 NOTE — Telephone Encounter (Signed)
 Follow-up after same day discharge: Implant date: 11/17/2024 MD: MCA Device: ICD Location: L chest    Wound check visit: 11/27/2024 90 day MD follow-up: 02/17/2025  Remote Transmission received:yes  Dressing/sling removed: yes  Confirm OAC restart on: yes  Please continue to monitor your cardiac device site for redness, swelling, and drainage. Call the device clinic at [phone] if you experience these symptoms, fever/chills, or have questions about your device.   Remote monitoring is used to monitor your cardiac device from home. This monitoring is scheduled every 91 days by our office. It allows us  to keep an eye on the functioning of your device to ensure it is working properly.

## 2024-11-27 ENCOUNTER — Ambulatory Visit (HOSPITAL_COMMUNITY): Payer: Self-pay | Admitting: Internal Medicine

## 2024-11-27 ENCOUNTER — Encounter (HOSPITAL_COMMUNITY): Payer: Self-pay

## 2024-11-27 ENCOUNTER — Ambulatory Visit: Admit: 2024-11-27 | Discharge: 2024-11-27 | Disposition: A | Attending: Internal Medicine

## 2024-11-27 ENCOUNTER — Other Ambulatory Visit (HOSPITAL_COMMUNITY): Payer: Self-pay

## 2024-11-27 ENCOUNTER — Ambulatory Visit

## 2024-11-27 VITALS — BP 162/90 | HR 63 | Ht 60.0 in | Wt 185.0 lb

## 2024-11-27 DIAGNOSIS — I110 Hypertensive heart disease with heart failure: Secondary | ICD-10-CM | POA: Diagnosis not present

## 2024-11-27 DIAGNOSIS — I472 Ventricular tachycardia, unspecified: Secondary | ICD-10-CM

## 2024-11-27 DIAGNOSIS — I97638 Postprocedural hematoma of a circulatory system organ or structure following other circulatory system procedure: Secondary | ICD-10-CM | POA: Diagnosis not present

## 2024-11-27 DIAGNOSIS — T82837S Hemorrhage of cardiac prosthetic devices, implants and grafts, sequela: Secondary | ICD-10-CM | POA: Diagnosis not present

## 2024-11-27 DIAGNOSIS — Z9221 Personal history of antineoplastic chemotherapy: Secondary | ICD-10-CM | POA: Diagnosis not present

## 2024-11-27 DIAGNOSIS — I491 Atrial premature depolarization: Secondary | ICD-10-CM | POA: Diagnosis not present

## 2024-11-27 DIAGNOSIS — R531 Weakness: Secondary | ICD-10-CM | POA: Diagnosis not present

## 2024-11-27 DIAGNOSIS — Z8674 Personal history of sudden cardiac arrest: Secondary | ICD-10-CM | POA: Diagnosis not present

## 2024-11-27 DIAGNOSIS — J449 Chronic obstructive pulmonary disease, unspecified: Secondary | ICD-10-CM | POA: Diagnosis not present

## 2024-11-27 DIAGNOSIS — Z923 Personal history of irradiation: Secondary | ICD-10-CM | POA: Diagnosis not present

## 2024-11-27 DIAGNOSIS — Z59868 Other specified financial insecurity: Secondary | ICD-10-CM | POA: Diagnosis not present

## 2024-11-27 DIAGNOSIS — Z9010 Acquired absence of unspecified breast and nipple: Secondary | ICD-10-CM | POA: Diagnosis not present

## 2024-11-27 DIAGNOSIS — R799 Abnormal finding of blood chemistry, unspecified: Secondary | ICD-10-CM | POA: Diagnosis not present

## 2024-11-27 DIAGNOSIS — Z853 Personal history of malignant neoplasm of breast: Secondary | ICD-10-CM | POA: Diagnosis not present

## 2024-11-27 DIAGNOSIS — I5022 Chronic systolic (congestive) heart failure: Secondary | ICD-10-CM

## 2024-11-27 DIAGNOSIS — E039 Hypothyroidism, unspecified: Secondary | ICD-10-CM | POA: Diagnosis not present

## 2024-11-27 DIAGNOSIS — Z79890 Hormone replacement therapy (postmenopausal): Secondary | ICD-10-CM | POA: Diagnosis not present

## 2024-11-27 DIAGNOSIS — Z79899 Other long term (current) drug therapy: Secondary | ICD-10-CM | POA: Diagnosis not present

## 2024-11-27 DIAGNOSIS — Z5982 Transportation insecurity: Secondary | ICD-10-CM | POA: Diagnosis not present

## 2024-11-27 DIAGNOSIS — I4729 Other ventricular tachycardia: Secondary | ICD-10-CM | POA: Diagnosis not present

## 2024-11-27 DIAGNOSIS — Z4502 Encounter for adjustment and management of automatic implantable cardiac defibrillator: Secondary | ICD-10-CM | POA: Diagnosis not present

## 2024-11-27 DIAGNOSIS — Z634 Disappearance and death of family member: Secondary | ICD-10-CM | POA: Diagnosis not present

## 2024-11-27 DIAGNOSIS — R001 Bradycardia, unspecified: Secondary | ICD-10-CM | POA: Diagnosis not present

## 2024-11-27 DIAGNOSIS — Z87891 Personal history of nicotine dependence: Secondary | ICD-10-CM | POA: Diagnosis not present

## 2024-11-27 LAB — CBC
HCT: 38.1 % (ref 36.0–46.0)
Hemoglobin: 12.1 g/dL (ref 12.0–15.0)
MCH: 30 pg (ref 26.0–34.0)
MCHC: 31.8 g/dL (ref 30.0–36.0)
MCV: 94.3 fL (ref 80.0–100.0)
Platelets: 280 K/uL (ref 150–400)
RBC: 4.04 MIL/uL (ref 3.87–5.11)
RDW: 13.1 % (ref 11.5–15.5)
WBC: 6.5 K/uL (ref 4.0–10.5)
nRBC: 0 % (ref 0.0–0.2)

## 2024-11-27 LAB — CUP PACEART INCLINIC DEVICE CHECK
Brady Statistic RV Percent Paced: 1 % — CL
Date Time Interrogation Session: 20251204161109
Implantable Lead Connection Status: 753985
Implantable Lead Implant Date: 20251124
Implantable Lead Location: 753860
Implantable Lead Model: 672
Implantable Lead Serial Number: 299771
Implantable Pulse Generator Implant Date: 20251124
Pulse Gen Serial Number: 355992

## 2024-11-27 LAB — BASIC METABOLIC PANEL WITH GFR
Anion gap: 6 (ref 5–15)
BUN: 12 mg/dL (ref 8–23)
CO2: 30 mmol/L (ref 22–32)
Calcium: 9.1 mg/dL (ref 8.9–10.3)
Chloride: 102 mmol/L (ref 98–111)
Creatinine, Ser: 1.19 mg/dL — ABNORMAL HIGH (ref 0.44–1.00)
GFR, Estimated: 47 mL/min — ABNORMAL LOW (ref >60)
Glucose, Bld: 94 mg/dL (ref 70–99)
Potassium: 3.8 mmol/L (ref 3.5–5.1)
Sodium: 138 mmol/L (ref 135–145)

## 2024-11-27 LAB — BRAIN NATRIURETIC PEPTIDE: B Natriuretic Peptide: 193 pg/mL — ABNORMAL HIGH (ref 0.0–100.0)

## 2024-11-27 MED ORDER — SACUBITRIL-VALSARTAN 97-103 MG PO TABS: 1.0000 | ORAL_TABLET | Freq: Two times a day (BID) | ORAL | 3 refills | Status: DC

## 2024-11-27 NOTE — Patient Instructions (Addendum)
 Medication Changes:  INCREASE ENTRESTO  TO 97/103MG  TWICE DAILY   Lab Work:  Labs done today, your results will be available in MyChart, we will contact you for abnormal readings.  Follow-Up in: 2 weeks with APP clinic as scheduled   THEN AGAIN IN 4-6 WEEKS WITH PHARMACY   And then 8-10 WEEKS WITH DR. ROLAN WITH AN ECHO--PLEASE SCHEDULE THIS AT YOUR NEXT VISIT   At the [facility], you and your health needs are our priority. We have a designated team specialized in the treatment of Heart Failure. This Care Team includes your primary Heart Failure Specialized Cardiologist (physician), Advanced Practice Providers (APPs- Physician Assistants and Nurse Practitioners), and Pharmacist who all work together to provide you with the care you need, when you need it.   You may see any of the following providers on your designated Care Team at your next follow up:  Dr. Toribio Fuel Dr. Ezra Rolan Dr. Odis Brownie Greig Mosses, NP Caffie Shed, [place] [facility] [place], [place] Beckey Coe, NP Jordan Lee, NP Tinnie Redman, PharmD   Please be sure to bring in all your medications bottles to every appointment.   Need to Contact Us :  If you have any questions or concerns before your next appointment please send us  a message through [place] or call our office at [phone].    TO LEAVE A MESSAGE FOR THE NURSE SELECT OPTION 2, PLEASE LEAVE A MESSAGE INCLUDING: YOUR NAME DATE OF BIRTH CALL BACK NUMBER REASON FOR CALL**this is important as we prioritize the call backs  YOU WILL RECEIVE A CALL BACK THE SAME DAY AS LONG AS YOU CALL BEFORE 4:00 PM

## 2024-11-27 NOTE — Progress Notes (Signed)
 Normal single chamber ICD wound check. Wound well healed. Presenting rhythm: VS 67. Routine testing performed. Thresholds, sensing, and impedance consistent with implant measurements with 3.5V safety margin/auto capture until 3 month visit. No treated arrhythmias. Reviewed arm restrictions to continue for 6 weeks total post op. Reviewed shock plan.  Pt enrolled in remote follow-up.

## 2024-11-27 NOTE — Patient Instructions (Addendum)
   After Your ICD (Implantable Cardiac Defibrillator)    Monitor your defibrillator site for redness, swelling, and drainage. Call the device clinic at [phone] if you experience these symptoms or fever/chills.  Leave your pressure dressing in place until your wound check on Monday 12/01/2024.  Do not lift, push or pull greater than 10 pounds with the affected arm until 6 weeks after your procedure. There are no other restrictions in arm movement after your wound check appointment.  Your ICD is designed to protect you from life threatening heart rhythms. Because of this, you may receive a shock.   1 shock with no symptoms:  Call the office during business hours. 1 shock with symptoms (chest pain, chest pressure, dizziness, lightheadedness, shortness of breath, overall feeling unwell):  Call 911. If you experience 2 or more shocks in 24 hours:  Call 911. If you receive a shock, you should not drive.  [place] DMV - no driving for 6 months if you receive appropriate therapy from your ICD.   ICD Alerts:  Some alerts are vibratory and others beep. These are NOT emergencies. Please call our office to let us  know. If this occurs at night or on weekends, it can wait until the next business day. Send a remote transmission.  If your device is capable of reading fluid status (for heart failure), you will be offered monthly monitoring to review this with you.   Remote monitoring is used to monitor your ICD from home. This monitoring is scheduled every 91 days by our office. It allows us  to keep an eye on the functioning of your device to ensure it is working properly. You will routinely see your Electrophysiologist annually (more often if necessary).

## 2024-11-28 ENCOUNTER — Ambulatory Visit (INDEPENDENT_AMBULATORY_CARE_PROVIDER_SITE_OTHER): Admitting: Ophthalmology

## 2024-11-28 ENCOUNTER — Encounter (INDEPENDENT_AMBULATORY_CARE_PROVIDER_SITE_OTHER): Payer: Self-pay | Admitting: Ophthalmology

## 2024-11-28 DIAGNOSIS — H353211 Exudative age-related macular degeneration, right eye, with active choroidal neovascularization: Secondary | ICD-10-CM

## 2024-11-28 DIAGNOSIS — H35033 Hypertensive retinopathy, bilateral: Secondary | ICD-10-CM

## 2024-11-28 DIAGNOSIS — Z961 Presence of intraocular lens: Secondary | ICD-10-CM

## 2024-11-28 DIAGNOSIS — I10 Essential (primary) hypertension: Secondary | ICD-10-CM | POA: Diagnosis not present

## 2024-11-28 DIAGNOSIS — H353221 Exudative age-related macular degeneration, left eye, with active choroidal neovascularization: Secondary | ICD-10-CM

## 2024-11-28 DIAGNOSIS — H353231 Exudative age-related macular degeneration, bilateral, with active choroidal neovascularization: Secondary | ICD-10-CM | POA: Diagnosis not present

## 2024-11-28 LAB — HEMOGLOBIN A1C
Hgb A1c MFr Bld: 5.7 % — ABNORMAL HIGH (ref 4.8–5.6)
Mean Plasma Glucose: 117 mg/dL

## 2024-11-28 MED ORDER — FARICIMAB-SVOA 6 MG/0.05ML IZ SOSY
6.0000 mg | PREFILLED_SYRINGE | INTRAVITREAL | Status: AC | PRN
  Administered 2024-11-28: 6 mg via INTRAVITREAL

## 2024-12-01 ENCOUNTER — Ambulatory Visit: Attending: Cardiology

## 2024-12-01 DIAGNOSIS — I472 Ventricular tachycardia, unspecified: Secondary | ICD-10-CM

## 2024-12-01 NOTE — Patient Instructions (Signed)
 Continue to follow post implant instructions that were given to you last week.   Call the Device Clinic at [phone] if you notice any acute swelling or drainage from your implant site.   Follow up as scheduled.

## 2024-12-01 NOTE — Progress Notes (Signed)
 Patient seen in office today for removal of pressure dressing that was applied on 11/27/24 at her 10-14 wound check.  Pressure dressing removed- tolerated well by the patient. No drainage or redness noted at the implant site.  A minimal amount of swelling is still noted at the implant site.  Per Randall, RN who also saw the patient when in office last week and per the patient's son, swelling appears unchanged.  The patient is currently not on any OAC or antiplatelet therapy.  She does have a history of double mastectomy.   There is no pain upon palpation of the area around the device.  Minimal amount of bruising noted. I do feel like the patient' implant site is at normal stages of healing post procedure.    Advised the patient and her son to continue to monitor the implant site and call the Device Clinic back if they see any acute changes- increased swelling, drainage, redness- are noticed.   The patient and her son voice understanding and are agreeable.   Follow up as scheduled.

## 2024-12-02 ENCOUNTER — Ambulatory Visit

## 2024-12-02 VITALS — BP 150/71 | HR 57 | Temp 97.8°F | Ht 60.0 in | Wt 184.1 lb

## 2024-12-02 DIAGNOSIS — Z95811 Presence of heart assist device: Secondary | ICD-10-CM | POA: Insufficient documentation

## 2024-12-02 DIAGNOSIS — F419 Anxiety disorder, unspecified: Secondary | ICD-10-CM | POA: Diagnosis not present

## 2024-12-02 DIAGNOSIS — F339 Major depressive disorder, recurrent, unspecified: Secondary | ICD-10-CM

## 2024-12-02 DIAGNOSIS — E782 Mixed hyperlipidemia: Secondary | ICD-10-CM | POA: Diagnosis not present

## 2024-12-02 DIAGNOSIS — I10 Essential (primary) hypertension: Secondary | ICD-10-CM | POA: Diagnosis not present

## 2024-12-02 DIAGNOSIS — G4700 Insomnia, unspecified: Secondary | ICD-10-CM | POA: Diagnosis not present

## 2024-12-02 DIAGNOSIS — I5020 Unspecified systolic (congestive) heart failure: Secondary | ICD-10-CM

## 2024-12-02 DIAGNOSIS — Z0000 Encounter for general adult medical examination without abnormal findings: Secondary | ICD-10-CM | POA: Insufficient documentation

## 2024-12-02 DIAGNOSIS — J449 Chronic obstructive pulmonary disease, unspecified: Secondary | ICD-10-CM | POA: Diagnosis not present

## 2024-12-02 DIAGNOSIS — I472 Ventricular tachycardia, unspecified: Secondary | ICD-10-CM | POA: Diagnosis not present

## 2024-12-02 DIAGNOSIS — Z87891 Personal history of nicotine dependence: Secondary | ICD-10-CM

## 2024-12-02 DIAGNOSIS — Z1159 Encounter for screening for other viral diseases: Secondary | ICD-10-CM

## 2024-12-02 DIAGNOSIS — R7303 Prediabetes: Secondary | ICD-10-CM

## 2024-12-02 DIAGNOSIS — K219 Gastro-esophageal reflux disease without esophagitis: Secondary | ICD-10-CM | POA: Diagnosis not present

## 2024-12-02 DIAGNOSIS — E039 Hypothyroidism, unspecified: Secondary | ICD-10-CM

## 2024-12-02 DIAGNOSIS — Z122 Encounter for screening for malignant neoplasm of respiratory organs: Secondary | ICD-10-CM

## 2024-12-02 DIAGNOSIS — Z853 Personal history of malignant neoplasm of breast: Secondary | ICD-10-CM | POA: Insufficient documentation

## 2024-12-02 MED ORDER — MEXILETINE HCL 150 MG PO CAPS: 150.0000 mg | ORAL_CAPSULE | Freq: Two times a day (BID) | ORAL | 3 refills | Status: DC

## 2024-12-02 MED ORDER — EZETIMIBE 10 MG PO TABS: 10.0000 mg | ORAL_TABLET | Freq: Every day | ORAL | 5 refills | Status: DC

## 2024-12-02 MED ORDER — VENTOLIN HFA 108 (90 BASE) MCG/ACT IN AERS: 2.0000 | INHALATION_SPRAY | Freq: Four times a day (QID) | RESPIRATORY_TRACT | 0 refills | Status: DC | PRN

## 2024-12-02 MED ORDER — ROSUVASTATIN CALCIUM 20 MG PO TABS: 20.0000 mg | ORAL_TABLET | Freq: Every day | ORAL | 3 refills | Status: DC

## 2024-12-02 MED ORDER — SACUBITRIL-VALSARTAN 97-103 MG PO TABS: 1.0000 | ORAL_TABLET | Freq: Two times a day (BID) | ORAL | 3 refills | Status: DC

## 2024-12-02 MED ORDER — OMEPRAZOLE 20 MG PO CPDR: 20.0000 mg | DELAYED_RELEASE_CAPSULE | Freq: Every day | ORAL | 1 refills | Status: DC

## 2024-12-02 MED ORDER — TRAZODONE HCL 50 MG PO TABS: 25.0000 mg | ORAL_TABLET | Freq: Every evening | ORAL | 3 refills | Status: DC | PRN

## 2024-12-02 MED ORDER — TRELEGY ELLIPTA 100-62.5-25 MCG/ACT IN AEPB: 1.0000 | INHALATION_SPRAY | Freq: Every day | RESPIRATORY_TRACT | 11 refills | Status: DC

## 2024-12-02 MED ORDER — CARVEDILOL 3.125 MG PO TABS: 3.1250 mg | ORAL_TABLET | Freq: Two times a day (BID) | ORAL | 5 refills | Status: DC

## 2024-12-02 MED ORDER — BUPROPION HCL ER (SR) 150 MG PO TB12: 150.0000 mg | ORAL_TABLET | Freq: Every day | ORAL | 1 refills | Status: DC

## 2024-12-02 MED ORDER — LEVOTHYROXINE SODIUM 125 MCG PO TABS: 125.0000 ug | ORAL_TABLET | Freq: Every day | ORAL | 2 refills | Status: DC

## 2024-12-02 MED ORDER — SPIRONOLACTONE 25 MG PO TABS: 25.0000 mg | ORAL_TABLET | Freq: Every day | ORAL | 5 refills | Status: DC

## 2024-12-02 NOTE — Assessment & Plan Note (Signed)
 Continue Wellbutrin  150 mg daily. Have discussed that Lexapro  can prolong QT and put her at risk for arrhythmias which she has dealt with in the past (V Tach, TDP). Patient agreeable to discontinuing. Suggest doing 5 mg for 1 week before discontinuing all together.  If mood worsens, can consider increasing Wellbutrin 

## 2024-12-02 NOTE — Progress Notes (Signed)
 New Patient Office Visit  Subjective    Patient ID: Kimberly Waters, female    DOB: [birth year]  Age: 78 y.o. MRN: [id number]  CC:  Chief Complaint  Patient presents with   New Patient (Initial Visit)    Discussed the use of AI scribe software for clinical note transcription with the patient, who gave verbal consent to proceed.  History of Present Illness   Kimberly Waters is a 78 year old female with heart failure and COPD who presents for follow-up after a recent hospitalization for ventricular tachycardia. She is accompanied by her daughter, who is her primary caregiver.  Ventricular tachycardia and cardiac symptoms - Hospitalized on November 20th for ventricular tachycardia, discharged after six days - Cardiac rhythm stabilized during hospitalization with treatment  - Follow-up appointments scheduled with heart failure clinic and electrophysiology - Current medications include Entresto , carvedilol , spironolactone , Lasix , ezetimibe , rosuvastatin , and potassium supplement as needed with Lasix . Only uses lasix  when her weight is up 3+ lbs from baseline.  - Nitroglycerin  available, used only once in the last ten years  Dyspnea and chronic obstructive pulmonary disease (copd) - History of COPD managed with Symbicort  and albuterol  inhalers - Breathing generally stable with ambulation - Shortness of breath when lying down - Frequent use of albuterol  inhaler, possibly contributing to memory issues  Thyroid  dysfunction - Thyroid  medication (levothyroxine ) adjusted during recent hospitalization due to abnormal thyroid  function  Mood disorders - History of anxiety and depression - Currently taking bupropion  and escitalopram    Gastroesophageal reflux disease (gerd) - History of reflux managed with omeprazole   Immunization status - History of shingles - Not interested in receiving shingles vaccine  Social adjustment - Recently moved in with daughter following the passing of her  husband - Significant adjustment to new living situation      Other specialists include a retina specialist that she sees for treatment of macular degeneration  Screenings:  Colon Cancer: Declines  Lung Cancer: Agreeable; order placed  Breast Cancer: Declines  Diabetes: Checking A1c with labs  HLD: Checking lipid panel with labs  The ASCVD Risk score (Arnett DK, et al., 2019) failed to calculate for the following reasons:   Cannot find a previous HDL lab   Cannot find a previous total cholesterol lab  Outpatient Encounter Medications as of 12/02/2024  Medication Sig   acetaminophen  (TYLENOL ) 500 MG tablet Take 1,000 mg by mouth 2 (two) times daily as needed for headache or fever (pain).   albuterol  (PROVENTIL ,VENTOLIN ) 90 MCG/ACT inhaler Inhale 2 puffs into the lungs every 4 (four) hours as needed.   albuterol  (VENTOLIN  HFA) 108 (90 Base) MCG/ACT inhaler Inhale 2 puffs into the lungs every 6 (six) hours as needed for wheezing or shortness of breath.   Fluticasone -Umeclidin-Vilant (TRELEGY ELLIPTA ) 100-62.5-25 MCG/ACT AEPB Inhale 1 puff into the lungs daily.   furosemide  (LASIX ) 40 MG tablet Take 1 tablet (40 mg total) by mouth daily as needed (for weight gain > 3 lb in a day or 5 lb in a week or worsening leg edema, shortness of breath).   nitroGLYCERIN  (NITROSTAT ) 0.4 MG SL tablet Place 0.4 mg under the tongue every 5 (five) minutes as needed for chest pain.   potassium chloride  SA (KLOR-CON  M) 20 MEQ tablet Take 2 tablets (40 mEq total) by mouth daily as needed (when taking lasix ).   traZODone  (DESYREL ) 50 MG tablet Take 0.5 tablets (25 mg total) by mouth at bedtime as needed for sleep.   [DISCONTINUED] budesonide -formoterol  (SYMBICORT ) 80-4.5  MCG/ACT inhaler Inhale 2 puffs into the lungs 2 (two) times daily.   [DISCONTINUED] buPROPion  (WELLBUTRIN  SR) 150 MG 12 hr tablet Take 150 mg by mouth daily.    [DISCONTINUED] carvedilol  (COREG ) 3.125 MG tablet Take 1 tablet (3.125 mg total) by  mouth 2 (two) times daily with a meal.   [DISCONTINUED] escitalopram  (LEXAPRO ) 10 MG tablet Take 1 tablet (10 mg total) by mouth daily.   [DISCONTINUED] ezetimibe  (ZETIA ) 10 MG tablet Take 1 tablet (10 mg total) by mouth daily.   [DISCONTINUED] levothyroxine  (SYNTHROID ) 125 MCG tablet Take 1 tablet (125 mcg total) by mouth daily at 6 (six) AM.   [DISCONTINUED] mexiletine (MEXITIL ) 150 MG capsule Take 1 capsule (150 mg total) by mouth every 12 (twelve) hours.   [DISCONTINUED] omeprazole  (PRILOSEC) 20 MG capsule Take 20 mg by mouth daily.   [DISCONTINUED] rosuvastatin  (CRESTOR ) 20 MG tablet Take 1 tablet (20 mg total) by mouth daily.   [DISCONTINUED] sacubitril -valsartan  (ENTRESTO ) 97-103 MG Take 1 tablet by mouth 2 (two) times daily.   [DISCONTINUED] spironolactone  (ALDACTONE ) 25 MG tablet Take 1 tablet (25 mg total) by mouth daily.   buPROPion  (WELLBUTRIN  SR) 150 MG 12 hr tablet Take 1 tablet (150 mg total) by mouth daily.   carvedilol  (COREG ) 3.125 MG tablet Take 1 tablet (3.125 mg total) by mouth 2 (two) times daily with a meal.   ezetimibe  (ZETIA ) 10 MG tablet Take 1 tablet (10 mg total) by mouth daily.   levothyroxine  (SYNTHROID ) 125 MCG tablet Take 1 tablet (125 mcg total) by mouth daily at 6 (six) AM.   mexiletine (MEXITIL ) 150 MG capsule Take 1 capsule (150 mg total) by mouth every 12 (twelve) hours.   omeprazole  (PRILOSEC) 20 MG capsule Take 1 capsule (20 mg total) by mouth daily.   rosuvastatin  (CRESTOR ) 20 MG tablet Take 1 tablet (20 mg total) by mouth daily.   sacubitril -valsartan  (ENTRESTO ) 97-103 MG Take 1 tablet by mouth 2 (two) times daily.   spironolactone  (ALDACTONE ) 25 MG tablet Take 1 tablet (25 mg total) by mouth daily.   No facility-administered encounter medications on file as of 12/02/2024.    Past Medical History:  Diagnosis Date   Allergy    Breast cancer (HCC)    Colon polyps    Depression    Dyspnea    SEASONAL   Family history of breast cancer    Family  history of colon cancer    GERD (gastroesophageal reflux disease)    History of radiation therapy 06/05/17-07/20/17   right chest wall 50.4 Gy in 28 fractions, right axillary nodal region 45 Gy in 25 fractions   Hyperlipidemia    Hypertension    Hypertensive retinopathy    Hypothyroidism    Macular degeneration    Peripheral neuropathy 06/18/2019   PONV (postoperative nausea and vomiting)    Syncope 03/14/2023   Transient loss of consciousness 01/23/2023    Past Surgical History:  Procedure Laterality Date   ABDOMINAL HYSTERECTOMY     CATARACT EXTRACTION, BILATERAL Bilateral    Dr. [facility]   ICD IMPLANT N/A 11/17/2024   Procedure: ICD IMPLANT;  Surgeon: Almetta Donnice LABOR, MD;  Location: [facility] INVASIVE CV LAB;  Service: Cardiovascular;  Laterality: N/A;   KNEE SURGERY Right    LEFT HEART CATH AND CORONARY ANGIOGRAPHY N/A 02/02/2023   Procedure: LEFT HEART CATH AND CORONARY ANGIOGRAPHY;  Surgeon: Burnard Debby LABOR, MD;  Location: [facility] INVASIVE CV LAB;  Service: Cardiovascular;  Laterality: N/A;   MASTECTOMY W/ SENTINEL  NODE BIOPSY Right 03/05/2017   Procedure: BILATERAL TOTAL MASTECTOMIES WITH RIGHT SENTINEL LYMPH NODE BIOPSIES;  Surgeon: Morene Olives, MD;  Location: [facility] OR;  Service: General;  Laterality: Right;   SHOULDER SURGERY     BILAT    Family History  Problem Relation Age of Onset   Heart disease Mother 56       stents   Colon cancer Maternal Grandfather 59   Colon cancer Maternal Aunt 65   Colon cancer Maternal Uncle        dx in his 35s   Breast cancer Paternal Aunt 24   Breast cancer Cousin 66       paternal first cousin   Lung cancer Paternal Grandfather    Colon cancer Maternal Uncle    Head & neck cancer Maternal Uncle        oral cancer    Social History   Socioeconomic History   Marital status: Widowed    Spouse name: Not on file   Number of children: 2   Years of education: Not on file   Highest education level: Not on file   Occupational History   Occupation: reitred [employer]  Tobacco Use   Smoking status: Former    Current packs/day: 0.00    Average packs/day: 1 pack/day for 45.0 years (45.0 ttl pk-yrs)    Types: Cigarettes    Start date: 02/23/1972    Quit date: 02/22/2017    Years since quitting: 7.7   Smokeless tobacco: Never  Vaping Use   Vaping status: Never Used  Substance and Sexual Activity   Alcohol use: No    Alcohol/week: 0.0 standard drinks of alcohol   Drug use: No   Sexual activity: Not Currently  Other Topics Concern   Not on file  Social History Narrative   Not on file   Social Drivers of Health   Financial Resource Strain: Not on file  Food Insecurity: No Food Insecurity (11/15/2024)   Hunger Vital Sign    Worried About Running Out of Food in the Last Year: Never true    Ran Out of Food in the Last Year: Never true  Transportation Needs: Unmet Transportation Needs (11/15/2024)   PRAPARE - Administrator, Civil Service (Medical): Yes    Lack of Transportation (Non-Medical): Yes  Physical Activity: Not on file  Stress: Not on file  Social Connections: Socially Isolated (11/15/2024)   Social Connection and Isolation Panel    Frequency of Communication with Friends and Family: More than three times a week    Frequency of Social Gatherings with Friends and Family: Three times a week    Attends Religious Services: Never    Active Member of Clubs or Organizations: No    Attends Banker Meetings: Never    Marital Status: Widowed  Intimate Partner Violence: Not At Risk (11/15/2024)   Humiliation, Afraid, Rape, and Kick questionnaire    Fear of Current or Ex-Partner: No    Emotionally Abused: No    Physically Abused: No    Sexually Abused: No    ROS  Per HPI      Objective    BP (!) 150/71   Pulse (!) 57   Temp 97.8 F (36.6 C) (Oral)   Ht 5' (1.524 m)   Wt 184 lb 1.9 oz (83.5 kg)   SpO2 96%   BMI 35.96 kg/m   Physical  Exam Constitutional:      General: She is not in  acute distress.    Appearance: Normal appearance.  Eyes:     Pupils: Pupils are equal, round, and reactive to light.  Cardiovascular:     Rate and Rhythm: Normal rate and regular rhythm.     Heart sounds: Normal heart sounds. No murmur heard.    No friction rub. No gallop.  Pulmonary:     Effort: Pulmonary effort is normal. No respiratory distress.     Breath sounds: Normal breath sounds.  Abdominal:     General: Abdomen is flat. Bowel sounds are normal.     Palpations: Abdomen is soft.  Musculoskeletal:        General: No swelling.     Cervical back: Normal range of motion.  Lymphadenopathy:     Cervical: No cervical adenopathy.  Skin:    General: Skin is warm and dry.  Neurological:     General: No focal deficit present.     Mental Status: She is alert.  Psychiatric:        Mood and Affect: Mood normal.        Behavior: Behavior normal.        Thought Content: Thought content normal.           Assessment & Plan:   HYPERTENSION, BENIGN SYSTEMIC Assessment & Plan: BP goal < 130/80. Above goal in clinic today and on recheck. Managed with carvedilol , Entresto , and spironolactone . Blood pressure control crucial due to heart failure. - Continue current antihypertensive regimen. -Advised and educated on home BP monitoring  - If BP continues to stay above goal, consider increasing spironolactone .   Orders: -     Carvedilol ; Take 1 tablet (3.125 mg total) by mouth 2 (two) times daily with a meal.  Dispense: 180 tablet; Refill: 5 -     Sacubitril -Valsartan ; Take 1 tablet by mouth 2 (two) times daily.  Dispense: 180 tablet; Refill: 3 -     Spironolactone ; Take 1 tablet (25 mg total) by mouth daily.  Dispense: 90 tablet; Refill: 5 -     CBC with Differential/Platelet; Future  Mixed hyperlipidemia Assessment & Plan: Recheckign lipid panel with labs. Continue rosuvastatin  20 mg daily   Orders: -     Ezetimibe ; Take 1  tablet (10 mg total) by mouth daily.  Dispense: 90 tablet; Refill: 5 -     Rosuvastatin  Calcium ; Take 1 tablet (20 mg total) by mouth daily.  Dispense: 90 tablet; Refill: 3 -     Comprehensive metabolic panel with GFR; Future -     Lipid panel; Future  Chronic obstructive pulmonary disease, unspecified COPD type (HCC) Assessment & Plan: COPD managed with Symbicort  and albuterol  inhalers. Discussed switching to Trelegy for better symptom control and reduced albuterol  use. Insurance covers Trelegy. - Prescribed Trelegy inhaler, one puff daily in the morning. - Refilled albuterol  inhaler for as-needed use. - Instructed to rinse mouth after using Trelegy.  Orders: -     Trelegy Ellipta ; Inhale 1 puff into the lungs daily.  Dispense: 60 each; Refill: 11  Ventricular tachycardia (HCC) Assessment & Plan: Following with cardiology. Managed with Mexitil . Follow ups scheduled with EP and HF clinics.  Refills of all medicines sent into mail order pharmacy for pre-packaging per patient request.  Have discontinued Lexapro  due to risk of arrhythmia recurrence   Orders: -     Mexiletine HCl; Take 1 capsule (150 mg total) by mouth every 12 (twelve) hours.  Dispense: 180 capsule; Refill: 3 -     Magnesium ; Future  Gastroesophageal reflux  disease without esophagitis Assessment & Plan: GERD managed with omeprazole . - Refilled omeprazole  prescription.  Orders: -     Omeprazole ; Take 1 capsule (20 mg total) by mouth daily.  Dispense: 90 capsule; Refill: 1  Anxiety and depression -     buPROPion  HCl ER (SR); Take 1 tablet (150 mg total) by mouth daily.  Dispense: 90 tablet; Refill: 1  Hypothyroidism, unspecified type Assessment & Plan: Levothyroxine  increased to 125 mcg in hospital. Has been on this dose for 2 weeks. Will plan to recheck thyroid  panel in 4 weeks and adjust medication as indicated.   Orders: -     Levothyroxine  Sodium; Take 1 tablet (125 mcg total) by mouth daily at 6 (six) AM.   Dispense: 90 tablet; Refill: 2 -     Thyroid  Panel With TSH; Future  Insomnia, unspecified type Assessment & Plan: Discussed low dose trazodone  as needed for sleep. 25 mg trazodone  sent to pharmacy.   Orders: -     traZODone  HCl; Take 0.5 tablets (25 mg total) by mouth at bedtime as needed for sleep.  Dispense: 30 tablet; Refill: 3  HFrEF (heart failure with reduced ejection fraction) (HCC) -     Carvedilol ; Take 1 tablet (3.125 mg total) by mouth 2 (two) times daily with a meal.  Dispense: 180 tablet; Refill: 5 -     Sacubitril -Valsartan ; Take 1 tablet by mouth 2 (two) times daily.  Dispense: 180 tablet; Refill: 3 -     Spironolactone ; Take 1 tablet (25 mg total) by mouth daily.  Dispense: 90 tablet; Refill: 5  Screening for lung cancer -     CT CHEST LUNG CANCER SCREENING LOW DOSE WO CONTRAST; Future  Personal history of nicotine  dependence -     CT CHEST LUNG CANCER SCREENING LOW DOSE WO CONTRAST; Future  Prediabetes -     Hemoglobin A1c; Future  Screening for viral disease -     Hepatitis C antibody; Future  Presence of heart assist device [facility]) Assessment & Plan: Pacemaker in place. Surgical site is C/D/I. Continue regular follow ups with cardiology.   Obesity, morbid (HCC) Assessment & Plan: BMI 36 + comorbidities = morbid obesity.  Discussed heart healthy diet    History of breast cancer Assessment & Plan: Double mastectomy seven years ago with radiation therapy. No current oncology follow-up needed.   Healthcare maintenance Assessment & Plan: Up to date on flu, pneumonia, and tetanus vaccines. Declined shingles and COVID vaccines. Eligible for lung cancer screening and colonoscopy, but declined colonoscopy. Declined bone density scan but agreed to calcium  and vitamin D  supplementation. - Ordered annual lung cancer screening CT scan. - Recommended calcium  and vitamin D  supplementation.    Episode of recurrent major depressive disorder, unspecified depression  episode severity Assessment & Plan: Continue Wellbutrin  150 mg daily. Have discussed that Lexapro  can prolong QT and put her at risk for arrhythmias which she has dealt with in the past (V Tach, TDP). Patient agreeable to discontinuing. Suggest doing 5 mg for 1 week before discontinuing all together.  If mood worsens, can consider increasing Wellbutrin    Other orders -     Ventolin  HFA; Inhale 2 puffs into the lungs every 6 (six) hours as needed for wheezing or shortness of breath.  Dispense: 18 each; Refill: 0     Return in about 3 months (around 03/02/2025) for HTN, HLD, thyroid , sleep, mood.   Saddie JULIANNA Sacks, PA-C

## 2024-12-02 NOTE — Assessment & Plan Note (Signed)
 Recheckign lipid panel with labs. Continue rosuvastatin  20 mg daily

## 2024-12-02 NOTE — Assessment & Plan Note (Signed)
 Discussed low dose trazodone  as needed for sleep. 25 mg trazodone  sent to pharmacy.

## 2024-12-02 NOTE — Assessment & Plan Note (Signed)
 Up to date on flu, pneumonia, and tetanus vaccines. Declined shingles and COVID vaccines. Eligible for lung cancer screening and colonoscopy, but declined colonoscopy. Declined bone density scan but agreed to calcium  and vitamin D  supplementation. - Ordered annual lung cancer screening CT scan. - Recommended calcium  and vitamin D  supplementation.

## 2024-12-02 NOTE — Assessment & Plan Note (Signed)
 Levothyroxine  increased to 125 mcg in hospital. Has been on this dose for 2 weeks. Will plan to recheck thyroid  panel in 4 weeks and adjust medication as indicated.

## 2024-12-02 NOTE — Patient Instructions (Signed)
 VISIT SUMMARY: You had a follow-up visit today after your recent hospitalization for ventricular tachycardia. We discussed your current medications, made some adjustments, and reviewed your overall health management plan.  YOUR PLAN: CHRONIC OBSTRUCTIVE PULMONARY DISEASE (COPD): Your COPD is being managed with inhalers. -Switch to Trelegy inhaler, one puff daily in the morning. This will replace your Symbicort . -Refilled albuterol  inhaler for as-needed use. -Rinse your mouth after using Trelegy.  HEART FAILURE WITH VENTRICULAR TACHYCARDIA: You were recently hospitalized for ventricular tachycardia and your heart failure medications were adjusted. -Continue your current heart failure medications as prescribed by your cardiologist. -Attend your follow-up appointments with the heart failure clinic and electrophysiology.  CHRONIC KIDNEY DISEASE: You have chronic kidney disease. -No changes to your current management plan.  HYPERTENSION: Your high blood pressure is being managed with medications. -Continue your current antihypertensive medications.  HYPERLIPIDEMIA: Your high cholesterol is being managed with medications. -Continue your current lipid-lowering medications as prescribed by your cardiologist.  HYPOTHYROIDISM: Your thyroid  medication was recently adjusted. -Continue taking levothyroxine  125 mcg daily. -Schedule thyroid  function tests in four weeks.  DEPRESSION AND ANXIETY: You are being treated for depression and anxiety. -Continue taking bupropion  150 mg daily. -Discontinue Lexapro  with tapering: half tablet for one week, then stop.  GASTROESOPHAGEAL REFLUX DISEASE (GERD): Your GERD is being managed with medication. -Refilled omeprazole  prescription.  HISTORY OF BREAST CANCER, POST-MASTECTOMY: You had a double mastectomy seven years ago and no current oncology follow-up is needed. -No changes to your current management plan.  INSOMNIA: - I have prescribed a medication  call Trazodone . Please take HALF of a tablet as needed 30-60 minutes before bedtime for sleep.   GENERAL HEALTH MAINTENANCE: You are up to date on most vaccines and have declined some screenings. -Ordered annual lung cancer screening CT scan. -Recommended calcium  and vitamin D  supplementation to support bone health.   If you have any problems before your next visit feel free to message me via MyChart (minor issues or questions) or call the office, otherwise you may reach out to schedule an office visit.  Thank you! Saddie Sacks, PA-C

## 2024-12-02 NOTE — Assessment & Plan Note (Signed)
 GERD managed with omeprazole . - Refilled omeprazole  prescription.

## 2024-12-02 NOTE — Assessment & Plan Note (Signed)
 Following with cardiology. Managed with Mexitil . Follow ups scheduled with EP and HF clinics.  Refills of all medicines sent into mail order pharmacy for pre-packaging per patient request.  Have discontinued Lexapro  due to risk of arrhythmia recurrence

## 2024-12-02 NOTE — Assessment & Plan Note (Signed)
 BMI 36 + comorbidities = morbid obesity.  Discussed heart healthy diet

## 2024-12-02 NOTE — Assessment & Plan Note (Signed)
 BP goal < 130/80. Above goal in clinic today and on recheck. Managed with carvedilol , Entresto , and spironolactone . Blood pressure control crucial due to heart failure. - Continue current antihypertensive regimen. -Advised and educated on home BP monitoring  - If BP continues to stay above goal, consider increasing spironolactone .

## 2024-12-02 NOTE — Assessment & Plan Note (Signed)
 Pacemaker in place. Surgical site is C/D/I. Continue regular follow ups with cardiology.

## 2024-12-02 NOTE — Assessment & Plan Note (Signed)
 Double mastectomy seven years ago with radiation therapy. No current oncology follow-up needed.

## 2024-12-02 NOTE — Assessment & Plan Note (Signed)
 COPD managed with Symbicort  and albuterol  inhalers. Discussed switching to Trelegy for better symptom control and reduced albuterol  use. Insurance covers Trelegy. - Prescribed Trelegy inhaler, one puff daily in the morning. - Refilled albuterol  inhaler for as-needed use. - Instructed to rinse mouth after using Trelegy.

## 2024-12-03 ENCOUNTER — Other Ambulatory Visit (HOSPITAL_COMMUNITY): Payer: Self-pay | Admitting: Physician Assistant

## 2024-12-03 ENCOUNTER — Other Ambulatory Visit: Payer: Self-pay

## 2024-12-03 ENCOUNTER — Telehealth: Payer: Self-pay

## 2024-12-03 DIAGNOSIS — E782 Mixed hyperlipidemia: Secondary | ICD-10-CM

## 2024-12-03 DIAGNOSIS — K219 Gastro-esophageal reflux disease without esophagitis: Secondary | ICD-10-CM

## 2024-12-03 DIAGNOSIS — I10 Essential (primary) hypertension: Secondary | ICD-10-CM

## 2024-12-03 DIAGNOSIS — I472 Ventricular tachycardia, unspecified: Secondary | ICD-10-CM

## 2024-12-03 DIAGNOSIS — I5020 Unspecified systolic (congestive) heart failure: Secondary | ICD-10-CM

## 2024-12-03 DIAGNOSIS — E039 Hypothyroidism, unspecified: Secondary | ICD-10-CM

## 2024-12-04 ENCOUNTER — Other Ambulatory Visit: Payer: Self-pay

## 2024-12-04 ENCOUNTER — Other Ambulatory Visit (HOSPITAL_COMMUNITY): Payer: Self-pay

## 2024-12-04 ENCOUNTER — Other Ambulatory Visit (HOSPITAL_COMMUNITY): Payer: Self-pay | Admitting: Cardiology

## 2024-12-04 ENCOUNTER — Ambulatory Visit (HOSPITAL_COMMUNITY): Payer: Self-pay | Admitting: Internal Medicine

## 2024-12-04 ENCOUNTER — Ambulatory Visit: Payer: Self-pay | Admitting: Student in an Organized Health Care Education/Training Program

## 2024-12-04 ENCOUNTER — Ambulatory Visit (HOSPITAL_COMMUNITY): Admission: RE | Admit: 2024-12-04 | Discharge: 2024-12-04 | Attending: Internal Medicine

## 2024-12-04 ENCOUNTER — Encounter (HOSPITAL_COMMUNITY): Payer: Self-pay

## 2024-12-04 DIAGNOSIS — I5032 Chronic diastolic (congestive) heart failure: Secondary | ICD-10-CM

## 2024-12-04 LAB — BASIC METABOLIC PANEL WITH GFR
Anion gap: 13 (ref 5–15)
BUN: 10 mg/dL (ref 8–23)
CO2: 27 mmol/L (ref 22–32)
Calcium: 9.3 mg/dL (ref 8.9–10.3)
Chloride: 100 mmol/L (ref 98–111)
Creatinine, Ser: 1.03 mg/dL — ABNORMAL HIGH (ref 0.44–1.00)
GFR, Estimated: 56 mL/min — ABNORMAL LOW (ref >60)
Glucose, Bld: 103 mg/dL — ABNORMAL HIGH (ref 70–99)
Potassium: 3 mmol/L — ABNORMAL LOW (ref 3.5–5.1)
Sodium: 140 mmol/L (ref 135–145)

## 2024-12-04 MED ORDER — FUROSEMIDE 40 MG PO TABS: 40.0000 mg | ORAL_TABLET | Freq: Every day | ORAL | 3 refills | Status: AC | PRN

## 2024-12-04 NOTE — Telephone Encounter (Signed)
 Error

## 2024-12-08 ENCOUNTER — Other Ambulatory Visit: Payer: Self-pay

## 2024-12-08 NOTE — Telephone Encounter (Unsigned)
 Copied from CRM #[id number]. Topic: Clinical - Medication Refill >> Dec 08, 2024  5:41 PM Delon T wrote: Medication: potassium chloride  SA (KLOR-CON  M) 20 MEQ tablet nitroGLYCERIN  (NITROSTAT ) 0.4 MG SL tablet acetaminophen  (TYLENOL ) 500 MG tablet  Has the patient contacted their pharmacy? Yes (Agent: If no, request that the patient contact the pharmacy for the refill. If patient does not wish to contact the pharmacy document the reason why and proceed with request.) (Agent: If yes, when and what did the pharmacy advise?)  This is the patient's preferred pharmacy:  [facility] [address] [place] - [address] [place] [address] Phone: [phone] Fax: [phone]    [facility] - [address], [place] - [address] [place] [address] Phone: [phone] Fax: [phone]  Is this the correct pharmacy for this prescription? Yes If no, delete pharmacy and type the correct one.   Has the prescription been filled recently? Yes  Is the patient out of the medication? Yes  Has the patient been seen for an appointment in the last year OR does the patient have an upcoming appointment? Yes  Can we respond through MyChart? Yes  Agent: Please be advised that Rx refills may take up to 3 business days. We ask that you follow-up with your pharmacy.

## 2024-12-09 MED ORDER — POTASSIUM CHLORIDE CRYS ER 20 MEQ PO TBCR: 40.0000 meq | EXTENDED_RELEASE_TABLET | Freq: Every day | ORAL | 1 refills | Status: DC | PRN

## 2024-12-09 MED ORDER — NITROGLYCERIN 0.4 MG SL SUBL: 0.4000 mg | SUBLINGUAL_TABLET | SUBLINGUAL | 2 refills | Status: AC | PRN

## 2024-12-09 MED ORDER — ACETAMINOPHEN 500 MG PO TABS: 1000.0000 mg | ORAL_TABLET | Freq: Two times a day (BID) | ORAL | 2 refills | Status: DC | PRN

## 2024-12-10 ENCOUNTER — Telehealth: Payer: Self-pay

## 2024-12-10 NOTE — Telephone Encounter (Signed)
 I advise doubling the dose of spironolactone  -- so she needs to take 2 instead of 1 and then updating us  on Friday.   If headache is persistent and does not improve with better BP readings, she begins to experience chest pain, worsening shortness of breath, vision changes, she needs to go to the ER

## 2024-12-10 NOTE — Telephone Encounter (Unsigned)
 Copied from CRM #[id number]. Topic: General - Other >> Dec 10, 2024  1:11 PM Joesph NOVAK wrote: Reason for CRM: [facility] home health nurse- April calling to report patients bp readings. Patient has a headache today. PH:[phone]  BP readings: 156/88 - today Early today- 171/83 Yesterday- 176/84 15th before 153/86 14th before 184/71 13th 159/80 12th- 176/80 Heart rate=  60 and 52

## 2024-12-11 ENCOUNTER — Telehealth (HOSPITAL_COMMUNITY): Payer: Self-pay

## 2024-12-11 NOTE — Telephone Encounter (Signed)
 Contacted Kimberly Waters and she did receive the message and she was going to call pts daughter to inform her of this.

## 2024-12-11 NOTE — Telephone Encounter (Signed)
 LVM for pt to call office to inform he of the below message.  Also called Emmanuel Gruenhagen with [facility] and had to LVM to inform her of the information as well.  I did leave a message with her informing her of this and gave her my direct line to call back if she needed to .

## 2024-12-11 NOTE — Telephone Encounter (Signed)
 Called to confirm/remind patient of their appointment at the [facility] on 12/12/24 11:30.   Appointment:   [x] Confirmed  [] Left mess   [] No answer/No voice mail  [] VM Full/unable to leave message  [] Phone not in service  Patient reminded to bring all medications and/or complete list.  Confirmed patient has transportation. Gave directions, instructed to utilize valet parking.

## 2024-12-12 ENCOUNTER — Encounter (HOSPITAL_COMMUNITY): Payer: Self-pay

## 2024-12-12 ENCOUNTER — Ambulatory Visit (HOSPITAL_COMMUNITY): Payer: Self-pay | Admitting: Cardiology

## 2024-12-12 ENCOUNTER — Ambulatory Visit (HOSPITAL_COMMUNITY)
Admission: RE | Admit: 2024-12-12 | Discharge: 2024-12-12 | Disposition: A | Discharge status: Home / Self Care | Source: Ambulatory Visit | Attending: Cardiology | Admitting: Cardiology

## 2024-12-12 VITALS — BP 180/78 | HR 63 | Ht 60.0 in | Wt 184.4 lb

## 2024-12-12 DIAGNOSIS — Z95810 Presence of automatic (implantable) cardiac defibrillator: Secondary | ICD-10-CM | POA: Diagnosis not present

## 2024-12-12 DIAGNOSIS — I2510 Atherosclerotic heart disease of native coronary artery without angina pectoris: Secondary | ICD-10-CM | POA: Diagnosis not present

## 2024-12-12 DIAGNOSIS — I5020 Unspecified systolic (congestive) heart failure: Secondary | ICD-10-CM

## 2024-12-12 DIAGNOSIS — Z79890 Hormone replacement therapy (postmenopausal): Secondary | ICD-10-CM | POA: Diagnosis not present

## 2024-12-12 DIAGNOSIS — E039 Hypothyroidism, unspecified: Secondary | ICD-10-CM | POA: Insufficient documentation

## 2024-12-12 DIAGNOSIS — Z79899 Other long term (current) drug therapy: Secondary | ICD-10-CM | POA: Insufficient documentation

## 2024-12-12 DIAGNOSIS — I5022 Chronic systolic (congestive) heart failure: Secondary | ICD-10-CM | POA: Insufficient documentation

## 2024-12-12 DIAGNOSIS — J449 Chronic obstructive pulmonary disease, unspecified: Secondary | ICD-10-CM | POA: Diagnosis not present

## 2024-12-12 DIAGNOSIS — E785 Hyperlipidemia, unspecified: Secondary | ICD-10-CM | POA: Diagnosis not present

## 2024-12-12 DIAGNOSIS — I110 Hypertensive heart disease with heart failure: Secondary | ICD-10-CM | POA: Insufficient documentation

## 2024-12-12 DIAGNOSIS — Z87891 Personal history of nicotine dependence: Secondary | ICD-10-CM | POA: Diagnosis not present

## 2024-12-12 DIAGNOSIS — I10 Essential (primary) hypertension: Secondary | ICD-10-CM

## 2024-12-12 LAB — BASIC METABOLIC PANEL WITH GFR
Anion gap: 12 (ref 5–15)
BUN: 14 mg/dL (ref 8–23)
CO2: 25 mmol/L (ref 22–32)
Calcium: 9.4 mg/dL (ref 8.9–10.3)
Chloride: 103 mmol/L (ref 98–111)
Creatinine, Ser: 0.96 mg/dL (ref 0.44–1.00)
GFR, Estimated: >60 mL/min
Glucose, Bld: 94 mg/dL (ref 70–99)
Potassium: 3.4 mmol/L — ABNORMAL LOW (ref 3.5–5.1)
Sodium: 139 mmol/L (ref 135–145)

## 2024-12-12 MED ORDER — SPIRONOLACTONE 50 MG PO TABS: 50.0000 mg | ORAL_TABLET | Freq: Every day | ORAL | 5 refills | Status: DC

## 2024-12-12 MED ORDER — CARVEDILOL 6.25 MG PO TABS: 6.2500 mg | ORAL_TABLET | Freq: Two times a day (BID) | ORAL | 3 refills | Status: DC

## 2024-12-12 NOTE — Patient Instructions (Signed)
 Medication Changes:  INCREASE SPIRONOLACTONE  TO 50MG  ONCE DAILY   INCREASE CARVEDILOL  TO 6.25MG  TWICE DAILY   Lab Work:  Labs done today, your results will be available in MyChart, we will contact you for abnormal readings.  Follow-Up in: 2 MONTHS AS SCHEDULED WITH DR. ROLAN   At the [facility], you and your health needs are our priority. We have a designated team specialized in the treatment of Heart Failure. This Care Team includes your primary Heart Failure Specialized Cardiologist (physician), Advanced Practice Providers (APPs- Physician Assistants and Nurse Practitioners), and Pharmacist who all work together to provide you with the care you need, when you need it.   You may see any of the following providers on your designated Care Team at your next follow up:  Dr. Toribio Fuel Dr. Ezra Rolan Dr. Odis Brownie Greig Mosses, NP Caffie Shed, [place] [facility] [place], [place] Beckey Coe, NP Jordan Lee, NP Tinnie Redman, PharmD   Please be sure to bring in all your medications bottles to every appointment.   Need to Contact Us :  If you have any questions or concerns before your next appointment please send us  a message through [place] or call our office at [phone].    TO LEAVE A MESSAGE FOR THE NURSE SELECT OPTION 2, PLEASE LEAVE A MESSAGE INCLUDING: YOUR NAME DATE OF BIRTH CALL BACK NUMBER REASON FOR CALL**this is important as we prioritize the call backs  YOU WILL RECEIVE A CALL BACK THE SAME DAY AS LONG AS YOU CALL BEFORE 4:00 PM

## 2024-12-12 NOTE — Progress Notes (Signed)
 "  [facility] PROGRESS NOTE  Referring Physician: Gayle Saddie FALCON, PA-C Primary Care: Gayle Saddie FALCON, [place] Primary Cardiologist: Oneil Parchment, MD  Chief Complaint: f/u for systolic heart failure   HPI: Kimberly Waters is a 78 y.o. female with history of breast cancer status post mastectomy, chemo and radiation, COPD, tobacco use, nonobstructive CAD, systolic HF (EF 40-45% on cMRI), prior history of polymorphic VT in the setting of prolonged QTc and hypothyroidism.   LHC 2024: no CAD. cMRI 2024 no LGE, normal ECV% and T2 values. EF 40-45%, RV nl.  Admitted 11/25 with OOHCA d/t PMVT.  She had witnessed syncope and reported loss of pulses per family. Received ~2 min of CPR. On arrival to the ED, she was having episodes of sustained polymorphic VT with R on T. QTc prolonged. Initially given IV amio but transitioned to lidocaine  gtt. Echo: EF 30-35% (previously 40-45%).  She was also markedly hypothyroid, after reporting that her home levothyroxine  dose had been decreased. TSH was elevated at 31.6 and Free T4 was low at 0.60. Levothyroxine  was increased from 100>>125 mcg. She was monitored in the CCU. She had no further VT. Lidocaine  gtt discontinued and she was transitioned to Mexiletine 150 mg bid.  She did have frequent couple PVCs and EP recommended placement of ICD. Underwent BSCI ICD by Dr. Almetta on 11/24. Did well post procedure other than some pocket soreness. HF GDMT titrated. Had some trouble with ongoing fatigue and dizziness. Seen by PT and felt to be appropriate for home health PT. Services were arranged. Stable at discharge with close follow ups.  She was doing fairly well at initial post hospital f/u. GDMT was titrated. Entresto  increased.   She presents back today for f/u and for continued med titration.  Doing fairly well. She does note some fatigue and chronic NYHA Class II symptoms, confounded by deconditioning and COPD but no wt gain. Rarely has to use PRN Lasix .   No ICD  shocks, unable to obtain interrogation today, [employer] device).   Daughter reports that her BP has been running high at home and her PCP just instructed her to increase Spiro to 50 mg daily (Just increased this morning). Her last BMP showed low K at 3.0.   BP today 180/78 after taking all am meds.    Past Medical History:  Diagnosis Date   Allergy    Breast cancer (HCC)    Colon polyps    Depression    Dyspnea    SEASONAL   Family history of breast cancer    Family history of colon cancer    GERD (gastroesophageal reflux disease)    History of radiation therapy 06/05/17-07/20/17   right chest wall 50.4 Gy in 28 fractions, right axillary nodal region 45 Gy in 25 fractions   Hyperlipidemia    Hypertension    Hypertensive retinopathy    Hypothyroidism    Macular degeneration    Peripheral neuropathy 06/18/2019   PONV (postoperative nausea and vomiting)    Syncope 03/14/2023   Transient loss of consciousness 01/23/2023    Current Outpatient Medications  Medication Sig Dispense Refill   acetaminophen  (TYLENOL ) 500 MG tablet Take 2 tablets (1,000 mg total) by mouth 2 (two) times daily as needed for headache or fever (pain). 30 tablet 2   albuterol  (PROVENTIL ,VENTOLIN ) 90 MCG/ACT inhaler Inhale 2 puffs into the lungs every 4 (four) hours as needed. 17 g 3   albuterol  (VENTOLIN  HFA) 108 (90 Base) MCG/ACT inhaler Inhale 2  puffs into the lungs every 6 (six) hours as needed for wheezing or shortness of breath. 18 each 0   buPROPion  (WELLBUTRIN  SR) 150 MG 12 hr tablet Take 1 tablet (150 mg total) by mouth daily. 90 tablet 1   carvedilol  (COREG ) 3.125 MG tablet Take 1 tablet (3.125 mg total) by mouth 2 (two) times daily with a meal. 180 tablet 5   ezetimibe  (ZETIA ) 10 MG tablet Take 1 tablet (10 mg total) by mouth daily. 90 tablet 5   Fluticasone -Umeclidin-Vilant (TRELEGY ELLIPTA ) 100-62.5-25 MCG/ACT AEPB Inhale 1 puff into the lungs daily. 60 each 11   furosemide  (LASIX ) 40 MG tablet  Take 1 tablet (40 mg total) by mouth daily as needed (for weight gain > 3 lb in a day or 5 lb in a week or worsening leg edema, shortness of breath). 90 tablet 3   levothyroxine  (SYNTHROID ) 125 MCG tablet Take 1 tablet (125 mcg total) by mouth daily at 6 (six) AM. 90 tablet 2   mexiletine (MEXITIL ) 150 MG capsule Take 1 capsule (150 mg total) by mouth every 12 (twelve) hours. 180 capsule 3   nitroGLYCERIN  (NITROSTAT ) 0.4 MG SL tablet Place 1 tablet (0.4 mg total) under the tongue every 5 (five) minutes as needed for chest pain. 10 tablet 2   omeprazole  (PRILOSEC) 20 MG capsule Take 1 capsule (20 mg total) by mouth daily. 90 capsule 1   potassium chloride  SA (KLOR-CON  M) 20 MEQ tablet Take 2 tablets (40 mEq total) by mouth daily as needed (when taking lasix ). 60 tablet 1   rosuvastatin  (CRESTOR ) 20 MG tablet Take 1 tablet (20 mg total) by mouth daily. 90 tablet 3   sacubitril -valsartan  (ENTRESTO ) 97-103 MG Take 1 tablet by mouth 2 (two) times daily. 180 tablet 3   spironolactone  (ALDACTONE ) 25 MG tablet Take 1 tablet (25 mg total) by mouth daily. 90 tablet 5   traZODone  (DESYREL ) 50 MG tablet Take 0.5 tablets (25 mg total) by mouth at bedtime as needed for sleep. 30 tablet 3   No current facility-administered medications for this encounter.    Allergies  Allergen Reactions   Latex Other (See Comments)    Tears skin.   Neurontin  [Gabapentin ] Other (See Comments)    Memory loss Confusion    Norvasc  [Amlodipine ] Other (See Comments)    Peripheral neuropathy   Adhesive [Tape] Other (See Comments)    (skin tears)  Tolerates PAPER TAPE   Codeine Nausea Only   Other Nausea Only    Anesthesia--nausea       Social History   Socioeconomic History   Marital status: Widowed    Spouse name: Not on file   Number of children: 2   Years of education: Not on file   Highest education level: Not on file  Occupational History   Occupation: reitred [employer]  Tobacco Use   Smoking  status: Former    Current packs/day: 0.00    Average packs/day: 1 pack/day for 45.0 years (45.0 ttl pk-yrs)    Types: Cigarettes    Start date: 02/23/1972    Quit date: 02/22/2017    Years since quitting: 7.8   Smokeless tobacco: Never  Vaping Use   Vaping status: Never Used  Substance and Sexual Activity   Alcohol use: No    Alcohol/week: 0.0 standard drinks of alcohol   Drug use: No   Sexual activity: Not Currently  Other Topics Concern   Not on file  Social History Narrative   Not on  file   Social Drivers of Health   Tobacco Use: Medium Risk (12/12/2024)   Patient History    Smoking Tobacco Use: Former    Smokeless Tobacco Use: Never    Passive Exposure: Not on Actuary Strain: Not on file  Food Insecurity: No Food Insecurity (11/15/2024)   Epic    Worried About Programme Researcher, Broadcasting/film/video in the Last Year: Never true    Ran Out of Food in the Last Year: Never true  Transportation Needs: Unmet Transportation Needs (11/15/2024)   Epic    Lack of Transportation (Medical): Yes    Lack of Transportation (Non-Medical): Yes  Physical Activity: Not on file  Stress: Not on file  Social Connections: Socially Isolated (11/15/2024)   Social Connection and Isolation Panel    Frequency of Communication with Friends and Family: More than three times a week    Frequency of Social Gatherings with Friends and Family: Three times a week    Attends Religious Services: Never    Active Member of Clubs or Organizations: No    Attends Banker Meetings: Never    Marital Status: Widowed  Intimate Partner Violence: Not At Risk (11/15/2024)   Epic    Fear of Current or Ex-Partner: No    Emotionally Abused: No    Physically Abused: No    Sexually Abused: No  Depression (PHQ2-9): Medium Risk (12/02/2024)   Depression (PHQ2-9)    PHQ-2 Score: 5  Alcohol Screen: Not on file  Housing: Low Risk (11/15/2024)   Epic    Unable to Pay for Housing in the Last Year: No     Number of Times Moved in the Last Year: 0    Homeless in the Last Year: No  Utilities: Not At Risk (11/15/2024)   Epic    Threatened with loss of utilities: No  Health Literacy: Not on file    Family History  Problem Relation Age of Onset   Heart disease Mother 18       stents   Colon cancer Maternal Grandfather 59   Colon cancer Maternal Aunt 65   Colon cancer Maternal Uncle        dx in his 41s   Breast cancer Paternal Aunt 29   Breast cancer Cousin 42       paternal first cousin   Lung cancer Paternal Grandfather    Colon cancer Maternal Uncle    Head & neck cancer Maternal Uncle        oral cancer    Vitals:   12/12/24 1135  BP: (!) 180/78  Pulse: 63  SpO2: 94%  Weight: 83.6 kg (184 lb 6.4 oz)  Height: 5' (1.524 m)     Physical Exam  GENERAL: NAD Lungs- clear  CARDIAC:  JVP: not elevated         Normal rate with regular rhythm. No MRG. No LEE  ABDOMEN: Soft, non-tender, non-distended.  EXTREMITIES: Warm and well perfused.  NEUROLOGIC: No obvious FND   Wt Readings from Last 3 Encounters:  12/12/24 83.6 kg (184 lb 6.4 oz)  12/02/24 83.5 kg (184 lb 1.9 oz)  11/27/24 83.9 kg (185 lb)     ECG: Not performed     Device interrogation: not performed (see HPI above)  ASSESSMENT & PLAN: H/o OOH Arrest D/t PMVT - reported loss of pulse in the field w/ 2 min of CPR. Sustained polymorphic VT with R on T on arrival.  QTc prolonged at 471. K 3.0,  Mg 1.5.  - Echo EF 30-35%  - No recent ischemic CP. Cath in 2024 showed no CAD  - cMRI 2024 w/ no LGE. Normal ECV% and T2 values. EF 40-45%, RV nl  - ICD placed 11/24. Denies ICD shocks  - Continue Mexiletine 150 mg bid.  - EP following     Chronic Systolic Heart Failure - cMRI 7975 EF 40-45%, RV ok - Echo 11/25 EF 30-35% - NYHA II - Appears euvolemic on exam. BP elevated - continue Lasix /KCl PRN. Discussed daily wts  - continue to push afterload reduction.  - Increase spiro to 50 mg daily. Check BMP today  -  Increase Coreg  to 6.25 mg bid  - Continue Entresto  97-103 mg bid  - Plan was to start SGLT2i next, Hgb A1c ok at 5.7.  - Update echo in ~3 months    HTN - BP elevated. GDMT titration per above - check BMP today   Hypothyroidism - management per PCP    F/u w/ PharmD  in 2-3 wks. F/u Dr. Rolan in 2 months    Caffie Shed PA-C   12/12/2024 [facility] [address] [facility] [place] [address] [phone] (office) "

## 2024-12-15 ENCOUNTER — Other Ambulatory Visit: Payer: Self-pay | Admitting: *Deleted

## 2024-12-15 ENCOUNTER — Other Ambulatory Visit: Payer: Self-pay

## 2024-12-15 DIAGNOSIS — J449 Chronic obstructive pulmonary disease, unspecified: Secondary | ICD-10-CM

## 2024-12-15 DIAGNOSIS — G4700 Insomnia, unspecified: Secondary | ICD-10-CM

## 2024-12-15 MED ORDER — TRELEGY ELLIPTA 100-62.5-25 MCG/ACT IN AEPB: 1.0000 | INHALATION_SPRAY | Freq: Every day | RESPIRATORY_TRACT | 11 refills | Status: DC

## 2024-12-15 MED ORDER — TRAZODONE HCL 50 MG PO TABS: 25.0000 mg | ORAL_TABLET | Freq: Every evening | ORAL | 3 refills | Status: DC | PRN

## 2024-12-15 NOTE — Telephone Encounter (Signed)
"   Pt daughter came into office and stated that they have not received her trellegy or trazadone [facility], requesting [facility] to resent to [facility].  Rx resent. Routing to provider as an FINANCIAL PLANNER.  "

## 2024-12-17 ENCOUNTER — Ambulatory Visit (HOSPITAL_COMMUNITY)
Admission: RE | Admit: 2024-12-17 | Discharge: 2024-12-17 | Disposition: A | Discharge status: Home / Self Care | Source: Ambulatory Visit | Attending: Internal Medicine | Admitting: Internal Medicine

## 2024-12-17 ENCOUNTER — Ambulatory Visit (HOSPITAL_COMMUNITY): Payer: Self-pay | Admitting: Cardiology

## 2024-12-17 DIAGNOSIS — I5020 Unspecified systolic (congestive) heart failure: Secondary | ICD-10-CM

## 2024-12-17 DIAGNOSIS — I5032 Chronic diastolic (congestive) heart failure: Secondary | ICD-10-CM | POA: Diagnosis not present

## 2024-12-17 LAB — BASIC METABOLIC PANEL WITH GFR
Anion gap: 11 (ref 5–15)
BUN: 18 mg/dL (ref 8–23)
CO2: 28 mmol/L (ref 22–32)
Calcium: 8.7 mg/dL — ABNORMAL LOW (ref 8.9–10.3)
Chloride: 103 mmol/L (ref 98–111)
Creatinine, Ser: 1.12 mg/dL — ABNORMAL HIGH (ref 0.44–1.00)
GFR, Estimated: 51 mL/min — ABNORMAL LOW
Glucose, Bld: 83 mg/dL (ref 70–99)
Potassium: 3.2 mmol/L — ABNORMAL LOW (ref 3.5–5.1)
Sodium: 141 mmol/L (ref 135–145)

## 2024-12-17 MED ORDER — POTASSIUM CHLORIDE CRYS ER 20 MEQ PO TBCR: 40.0000 meq | EXTENDED_RELEASE_TABLET | Freq: Every day | ORAL | Status: AC

## 2024-12-22 DIAGNOSIS — I5020 Unspecified systolic (congestive) heart failure: Secondary | ICD-10-CM

## 2024-12-22 DIAGNOSIS — I472 Ventricular tachycardia, unspecified: Secondary | ICD-10-CM

## 2024-12-22 DIAGNOSIS — G4700 Insomnia, unspecified: Secondary | ICD-10-CM

## 2024-12-22 DIAGNOSIS — F32A Depression, unspecified: Secondary | ICD-10-CM

## 2024-12-22 DIAGNOSIS — E039 Hypothyroidism, unspecified: Secondary | ICD-10-CM

## 2024-12-22 DIAGNOSIS — J449 Chronic obstructive pulmonary disease, unspecified: Secondary | ICD-10-CM

## 2024-12-22 DIAGNOSIS — E782 Mixed hyperlipidemia: Secondary | ICD-10-CM

## 2024-12-22 DIAGNOSIS — I10 Essential (primary) hypertension: Secondary | ICD-10-CM

## 2024-12-22 DIAGNOSIS — K219 Gastro-esophageal reflux disease without esophagitis: Secondary | ICD-10-CM

## 2024-12-22 MED ORDER — MEXILETINE HCL 150 MG PO CAPS: 150.0000 mg | ORAL_CAPSULE | Freq: Two times a day (BID) | ORAL | 3 refills | Status: DC

## 2024-12-22 MED ORDER — TRELEGY ELLIPTA 100-62.5-25 MCG/ACT IN AEPB: 1.0000 | INHALATION_SPRAY | Freq: Every day | RESPIRATORY_TRACT | 11 refills | Status: AC

## 2024-12-22 MED ORDER — SACUBITRIL-VALSARTAN 97-103 MG PO TABS: 1.0000 | ORAL_TABLET | Freq: Two times a day (BID) | ORAL | 3 refills | Status: DC

## 2024-12-22 MED ORDER — VENTOLIN HFA 108 (90 BASE) MCG/ACT IN AERS: 2.0000 | INHALATION_SPRAY | Freq: Four times a day (QID) | RESPIRATORY_TRACT | 0 refills | Status: AC | PRN

## 2024-12-22 MED ORDER — EZETIMIBE 10 MG PO TABS: 10.0000 mg | ORAL_TABLET | Freq: Every day | ORAL | 5 refills | Status: AC

## 2024-12-22 MED ORDER — OMEPRAZOLE 20 MG PO CPDR: 20.0000 mg | DELAYED_RELEASE_CAPSULE | Freq: Every day | ORAL | 1 refills | Status: DC

## 2024-12-22 MED ORDER — BUPROPION HCL ER (SR) 150 MG PO TB12: 150.0000 mg | ORAL_TABLET | Freq: Every day | ORAL | 1 refills | Status: AC

## 2024-12-22 MED ORDER — ROSUVASTATIN CALCIUM 20 MG PO TABS: 20.0000 mg | ORAL_TABLET | Freq: Every day | ORAL | 3 refills | Status: AC

## 2024-12-22 MED ORDER — LEVOTHYROXINE SODIUM 125 MCG PO TABS: 125.0000 ug | ORAL_TABLET | Freq: Every day | ORAL | 2 refills | Status: DC

## 2024-12-22 MED ORDER — CARVEDILOL 6.25 MG PO TABS: 6.2500 mg | ORAL_TABLET | Freq: Two times a day (BID) | ORAL | 3 refills | Status: DC

## 2024-12-22 MED ORDER — TRAZODONE HCL 50 MG PO TABS: 25.0000 mg | ORAL_TABLET | Freq: Every evening | ORAL | 3 refills | Status: AC | PRN

## 2024-12-22 MED ORDER — ACETAMINOPHEN 500 MG PO TABS: 1000.0000 mg | ORAL_TABLET | Freq: Two times a day (BID) | ORAL | 2 refills | Status: AC | PRN

## 2024-12-22 MED ORDER — SPIRONOLACTONE 50 MG PO TABS: 50.0000 mg | ORAL_TABLET | Freq: Every day | ORAL | 5 refills | Status: AC

## 2024-12-26 ENCOUNTER — Telehealth: Payer: Self-pay

## 2024-12-26 NOTE — Progress Notes (Signed)
 " [facility] - Clinic Note  01/02/2025     CHIEF COMPLAINT Patient presents for Retina Follow Up  HISTORY OF PRESENT ILLNESS: Kimberly Waters is a 79 y.o. female who presents to the clinic today for:  HPI     Retina Follow Up   Patient presents with  Wet AMD.  In both eyes.  This started 5 weeks ago.  Duration of 5 weeks.  Since onset it is stable.        Comments   5 week retina follow up ARMD and IVA OD pt is reporting no vision changes noticed she denies any flashes or floaters        Last edited by Resa Delon ORN, COT on 01/02/2025  1:25 PM.      Pt states   Referring physician: Dwight Trula SQUIBB, MD [address] [place],  [place] [address]  HISTORICAL INFORMATION:   Selected notes from the MEDICAL RECORD NUMBER Referred by Dr. Ladora for eval of decreased VA OD.   CURRENT MEDICATIONS: No current outpatient medications on file. (Ophthalmic Drugs)   No current facility-administered medications for this visit. (Ophthalmic Drugs)   Current Outpatient Medications (Other)  Medication Sig   acetaminophen  (TYLENOL ) 500 MG tablet Take 2 tablets (1,000 mg total) by mouth 2 (two) times daily as needed for headache or fever (pain).   albuterol  (VENTOLIN  HFA) 108 (90 Base) MCG/ACT inhaler Inhale 2 puffs into the lungs every 6 (six) hours as needed for wheezing or shortness of breath.   buPROPion  (WELLBUTRIN  SR) 150 MG 12 hr tablet Take 1 tablet (150 mg total) by mouth daily.   carvedilol  (COREG ) 6.25 MG tablet Take 1 tablet (6.25 mg total) by mouth 2 (two) times daily with a meal.   ezetimibe  (ZETIA ) 10 MG tablet Take 1 tablet (10 mg total) by mouth daily.   Fluticasone -Umeclidin-Vilant (TRELEGY ELLIPTA ) 100-62.5-25 MCG/ACT AEPB Inhale 1 puff into the lungs daily.   furosemide  (LASIX ) 40 MG tablet Take 1 tablet (40 mg total) by mouth daily as needed (for weight gain > 3 lb in a day or 5 lb in a week or worsening leg edema, shortness of breath).    levothyroxine  (SYNTHROID ) 125 MCG tablet Take 1 tablet (125 mcg total) by mouth daily at 6 (six) AM.   mexiletine (MEXITIL ) 150 MG capsule Take 1 capsule (150 mg total) by mouth every 12 (twelve) hours.   nitroGLYCERIN  (NITROSTAT ) 0.4 MG SL tablet Place 1 tablet (0.4 mg total) under the tongue every 5 (five) minutes as needed for chest pain.   omeprazole  (PRILOSEC) 20 MG capsule Take 1 capsule (20 mg total) by mouth daily.   potassium chloride  SA (KLOR-CON  M) 20 MEQ tablet Take 2 tablets (40 mEq total) by mouth daily.   rosuvastatin  (CRESTOR ) 20 MG tablet Take 1 tablet (20 mg total) by mouth daily.   sacubitril -valsartan  (ENTRESTO ) 97-103 MG Take 1 tablet by mouth 2 (two) times daily.   spironolactone  (ALDACTONE ) 50 MG tablet Take 1 tablet (50 mg total) by mouth daily.   traZODone  (DESYREL ) 50 MG tablet Take 0.5 tablets (25 mg total) by mouth at bedtime as needed for sleep.   No current facility-administered medications for this visit. (Other)   REVIEW OF SYSTEMS: ROS   Positive for: Neurological, Eyes, Respiratory Negative for: Constitutional, Gastrointestinal, Skin, Genitourinary, Musculoskeletal, HENT, Endocrine, Cardiovascular, Psychiatric, Allergic/Imm, Heme/Lymph Last edited by Resa Delon ORN, COT on 01/02/2025  1:25 PM.  ALLERGIES Allergies  Allergen Reactions   Latex Other (See Comments)    Tears skin.   Neurontin  [Gabapentin ] Other (See Comments)    Memory loss Confusion    Norvasc  [Amlodipine ] Other (See Comments)    Peripheral neuropathy   Adhesive [Tape] Other (See Comments)    (skin tears)  Tolerates PAPER TAPE   Codeine Nausea Only   Other Nausea Only    Anesthesia--nausea    PAST MEDICAL HISTORY Past Medical History:  Diagnosis Date   Allergy    Breast cancer (HCC)    Colon polyps    Depression    Dyspnea    SEASONAL   Family history of breast cancer    Family history of colon cancer    GERD (gastroesophageal reflux disease)    History of  radiation therapy 06/05/17-07/20/17   right chest wall 50.4 Gy in 28 fractions, right axillary nodal region 45 Gy in 25 fractions   Hyperlipidemia    Hypertension    Hypertensive retinopathy    Hypothyroidism    Macular degeneration    Peripheral neuropathy 06/18/2019   PONV (postoperative nausea and vomiting)    Syncope 03/14/2023   Transient loss of consciousness 01/23/2023   Past Surgical History:  Procedure Laterality Date   ABDOMINAL HYSTERECTOMY     CATARACT EXTRACTION, BILATERAL Bilateral    Dr. [facility]   ICD IMPLANT N/A 11/17/2024   Procedure: ICD IMPLANT;  Surgeon: Almetta Donnice LABOR, MD;  Location: [facility] INVASIVE CV LAB;  Service: Cardiovascular;  Laterality: N/A;   KNEE SURGERY Right    LEFT HEART CATH AND CORONARY ANGIOGRAPHY N/A 02/02/2023   Procedure: LEFT HEART CATH AND CORONARY ANGIOGRAPHY;  Surgeon: Burnard Debby LABOR, MD;  Location: [facility] INVASIVE CV LAB;  Service: Cardiovascular;  Laterality: N/A;   MASTECTOMY W/ SENTINEL NODE BIOPSY Right 03/05/2017   Procedure: BILATERAL TOTAL MASTECTOMIES WITH RIGHT SENTINEL LYMPH NODE BIOPSIES;  Surgeon: Morene Olives, MD;  Location: [facility] OR;  Service: General;  Laterality: Right;   SHOULDER SURGERY     BILAT   FAMILY HISTORY Family History  Problem Relation Age of Onset   Heart disease Mother 11       stents   Colon cancer Maternal Grandfather 59   Colon cancer Maternal Aunt 65   Colon cancer Maternal Uncle        dx in his 11s   Breast cancer Paternal Aunt 56   Breast cancer Cousin 57       paternal first cousin   Lung cancer Paternal Grandfather    Colon cancer Maternal Uncle    Head & neck cancer Maternal Uncle        oral cancer   SOCIAL HISTORY Social History   Tobacco Use   Smoking status: Former    Current packs/day: 0.00    Average packs/day: 1 pack/day for 45.0 years (45.0 ttl pk-yrs)    Types: Cigarettes    Start date: 02/23/1972    Quit date: 02/22/2017    Years since quitting: 7.8    Smokeless tobacco: Never  Vaping Use   Vaping status: Never Used  Substance Use Topics   Alcohol use: No    Alcohol/week: 0.0 standard drinks of alcohol   Drug use: No       OPHTHALMIC EXAM: Base Eye Exam     Visual Acuity (Snellen - Linear)       Right Left   Dist cc 20/60 -2 CF at 3'   Dist ph cc NI  Tonometry (Tonopen, 1:28 PM)       Right Left   Pressure 14 17         Pupils       Pupils Dark Light Shape React APD   Right PERRL 3 2 Round Brisk None   Left PERRL 3 2 Round Brisk None         Visual Fields       Left Right    Full Full         Extraocular Movement       Right Left    Full, Ortho Full, Ortho         Neuro/Psych     Oriented x3: Yes   Mood/Affect: Normal         Dilation     Both eyes: 2.5% Phenylephrine  @ 1:28 PM           Slit Lamp and Fundus Exam     Slit Lamp Exam       Right Left   Lids/Lashes Dermatochalasis - upper lid Dermatochalasis - upper lid, mild MGD   Conjunctiva/Sclera nasal and temporal pinguecula nasal and temporal pinguecula   Cornea arcus, well healed cataract wound, 2-3+ Punctate epithelial erosions Trace Punctate epithelial erosions, arcus, well healed cataract wound   Anterior Chamber deep, narrow temporal angle, 0.5+ fine cell and pigment deep, narrow temporal angle   Iris Round and moderately dilated, mild anterior bowing Round and moderately dilated, mild anterior bowing   Lens PC IOL in good position PC IOL in good position   Anterior Vitreous mild syneresis, Posterior vitreous detachment mild syneresis, Posterior vitreous detachment         Fundus Exam       Right Left   Disc Pink and Sharp, Compact, mild PPA Mild Pallor, Sharp rim, +elevation   C/D Ratio 0.0 0.1   Macula Blunted foveal reflex, +CNV with pigment clumping/ring, central IRH and SRF - stably improved, RPE mottling and clumping, Drusen, no heme, +subretinal fibrosis, trace cystic changes--slightly increased  Blunted foveal reflex, large area of GA with disciform scar and subretinal fibrosis, central pigment clumping, punctate IRH IT mac within area of GA   Vessels attenuated, Tortuous attenuated, mild tortuosity, mild AV crossing changes   Periphery Attached, scattered reticular degeneration, paving stone degeneration, no heme Attached, scattered reticular degeneration, scattered paving stone degeneration, No heme           Refraction     Wearing Rx       Sphere Cylinder Axis   Right -6.50 +2.00 152   Left -5.25 +1.00 018    Type: SVL           IMAGING AND PROCEDURES  Imaging and Procedures for 01/02/2025          ASSESSMENT/PLAN:   ICD-10-CM   1. Exudative age-related macular degeneration of right eye with active choroidal neovascularization (HCC)  H35.3211 OCT, Retina - OU - Both Eyes    2. Exudative age-related macular degeneration of left eye with active choroidal neovascularization (HCC)  H35.3221     3. Essential hypertension  I10     4. Hypertensive retinopathy of both eyes  H35.033     5. Pseudophakia, both eyes  Z96.1       1. Exudative age related macular degeneration OD  - delayed f/u (11.20.24 to 01.06.25): 6.5 wks instead of 4 -- death in family - lost to follow up from 8 weeks to 7 months (12.19.23 to 07.02.24) due to  cardiac issues -- vision decreased to 20/100 OD on 07.02.24  - pt initially presented w/ 2 wk history of decreased vision OD (12.21.2021) - history of intravitreal injections OS with Dr. Elner in 2017 and earlier -- pt reports stopping visits after the development of a corneal abrasion from a lid speculum. - s/p IVA OD #1 (12.21.21), #2 (01.24.22), #3 (2.21.22), #4 (03.22.22), #5 (4.26.22), #6 (5.31.22), #7 (7.12.22), #8 (8.16.22), #9 (9.20.22), #10 (10.25.22), #11 (12.6.22), #12 (01.10.23), #13 (02.14.23), #14 (03.21.23), #15 (04.25.23), #16 06.06.23), #17 (08.01.23), #18 (09.12.23), #19 (10.31.23), #20 (12.19.23) -- IVA  resistance ==================  - s/p IVE OD #1 (07.02.24), #2 (07.30.24),#3 (08.27.24), #4 (09.25.24), #5 (10.23.24), #6 (11.20.24), #7 (01.06.25), #8 (02.03.25), #9 (03.11.25) -- IVE resistance  ==================  - s/p IVV OD #1 (04.15.25), #2 (05.20.25), #3 (06.24.25), #4 (07.29.25), #5 (09.02.25) #6 (09.30.25) #7(11.07.25)#8(12.05.25)  **history of increased SRF at 6 wk interval, noted on 12.6.22**  **history of increased SRF at 8 wk interval, noted on 08.01.23** - OCT shows persistent IRF / cystic changes overlying central SRHM/CNV--slightly increased at 4 weeks  - BCVA OD 20/60 stable             - Recommend IVV OD #9 today, 01.09.26 w/ f/u in 5 weeks  - All treatments covered at 100% MCR+supp  - RBA of procedure discussed, questions answered  - IVE informed consent obtained and signed, 07.02.24 (OD)  - see procedure note  - f/u in 5 wks -- DFE/OCT, possible injection   2. Exudative age related macular degeneration, OS - OS with history inactive disciform scar, but new focal IRH noted 5.31.22 -- stably resolved on exam today - s/p IVA OS #1 (05.31.22), #2 (07.12.22), #3 (8.15.22), #4 (9.20.22) for focal [facility] - history of intravitreal injections OS with Dr. Elner in 2017 and earlier -- pt reports stopping visits after the development of a corneal abrasion from a lid speculum  - before being referred here, had not seen a retina specialist since 2017  - exam and OCT OS shows large central disciform scar with ORA, Foveal notch, trace cystic changes and temporal edema -- all stable   - BCVA CF OS -- stable - recommend holding IVA OS today -- pt in agreement - f/u 5 weeks DFE, OCT  3,4. Hypertensive retinopathy OU - discussed importance of tight BP control - monitor  5. Pseudophakia OU  - s/p CE/IOL (Dr. Fleeta)  - IOL in good position, doing well  - monitor  Ophthalmic Meds Ordered this visit:  No orders of the defined types were placed in this encounter.    No follow-ups on  file.  There are no Patient Instructions on file for this visit.  This document serves as a record of services personally performed by Redell JUDITHANN Hans, MD, PhD. It was created on their behalf by Delon Newness COT, an ophthalmic technician. The creation of this record is the provider's dictation and/or activities during the visit.    Electronically signed by: Delon Newness COT 01.02.26  1:50 PM   This document serves as a record of services personally performed by Redell JUDITHANN Hans, MD, PhD. It was created on their behalf by Almetta Pesa, an ophthalmic technician. The creation of this record is the provider's dictation and/or activities during the visit.    Electronically signed by: Almetta Pesa, OA, 01/02/2025  1:51 PM  Abbreviations: M myopia (nearsighted); A astigmatism; H hyperopia (farsighted); P presbyopia; Mrx spectacle prescription;  CTL contact lenses; OD right eye; OS  left eye; OU both eyes  XT exotropia; ET esotropia; PEK punctate epithelial keratitis; PEE punctate epithelial erosions; DES dry eye syndrome; MGD meibomian gland dysfunction; ATs artificial tears; PFAT's preservative free artificial tears; NSC nuclear sclerotic cataract; PSC posterior subcapsular cataract; ERM epi-retinal membrane; PVD posterior vitreous detachment; RD retinal detachment; DM diabetes mellitus; DR diabetic retinopathy; NPDR non-proliferative diabetic retinopathy; PDR proliferative diabetic retinopathy; CSME clinically significant macular edema; DME diabetic macular edema; dbh dot blot hemorrhages; CWS cotton wool spot; POAG primary open angle glaucoma; C/D cup-to-disc ratio; HVF humphrey visual field; GVF goldmann visual field; OCT optical coherence tomography; IOP intraocular pressure; BRVO Branch retinal vein occlusion; CRVO central retinal vein occlusion; CRAO central retinal artery occlusion; BRAO branch retinal artery occlusion; RT retinal tear; SB scleral buckle; PPV pars plana vitrectomy;  VH Vitreous hemorrhage; PRP panretinal laser photocoagulation; IVK intravitreal kenalog; VMT vitreomacular traction; MH Macular hole;  NVD neovascularization of the disc; NVE neovascularization elsewhere; AREDS age related eye disease study; ARMD age related macular degeneration; POAG primary open angle glaucoma; EBMD epithelial/anterior basement membrane dystrophy; ACIOL anterior chamber intraocular lens; IOL intraocular lens; PCIOL posterior chamber intraocular lens; Phaco/IOL phacoemulsification with intraocular lens placement; PRK photorefractive keratectomy; LASIK laser assisted in situ keratomileusis; HTN hypertension; DM diabetes mellitus; COPD chronic obstructive pulmonary disease "

## 2024-12-26 NOTE — Telephone Encounter (Signed)
 Can we confirm if we received a fax in [place]. I don't have anything in my basket

## 2024-12-26 NOTE — Telephone Encounter (Signed)
 Copied from CRM #[id number]. Topic: Clinical - Prescription Issue >> Dec 26, 2024  9:20 AM Travis FALCON wrote: Reason for CRM: [facility] is calling in because they sent a fax over to the office regarding patient's potassium chloride  as well as her Tramadol . Please follow up with [facility] pharmacy

## 2024-12-29 ENCOUNTER — Ambulatory Visit
Admission: RE | Admit: 2024-12-29 | Discharge: 2024-12-29 | Disposition: A | Discharge status: Home / Self Care | Source: Ambulatory Visit | Attending: Internal Medicine | Admitting: Internal Medicine

## 2024-12-29 ENCOUNTER — Ambulatory Visit

## 2024-12-29 ENCOUNTER — Ambulatory Visit: Admitting: *Deleted

## 2024-12-29 ENCOUNTER — Telehealth (HOSPITAL_COMMUNITY): Payer: Self-pay

## 2024-12-29 DIAGNOSIS — I5030 Unspecified diastolic (congestive) heart failure: Secondary | ICD-10-CM

## 2024-12-29 DIAGNOSIS — I472 Ventricular tachycardia, unspecified: Secondary | ICD-10-CM | POA: Diagnosis not present

## 2024-12-29 DIAGNOSIS — I5032 Chronic diastolic (congestive) heart failure: Secondary | ICD-10-CM

## 2024-12-29 NOTE — Telephone Encounter (Signed)
 Patient came to clinic today for repeat labs. While patient was here she inquired about her ICD site. Per patient its been swollen for the last couple of days and tender. Upon assessment the device site is swollen, and patient reported tenderness surrounding device, but mostly towards the bottom. Reached out to device clinic, they have a nurse from device assess today. Patient aware to report to [facility] st 5th floor for evaluation.

## 2024-12-29 NOTE — Progress Notes (Signed)
 Patient seen in clinic today because she has concerns about her device area, there is some slight swelling, but she's also complaining about tenderness   Date of implant 11/17/24  Patient not on an anticoagulants   Patient seen for 2 week post op visit and nothing abnormal was noted during visit  Patient stated that they dropped their phone on their device site while lying in bed 3-4 days ago and the site then became swollen and tender thereafter  Patient stated they are taking 1000 mg of oral Tylenol  BID for the pain  Very minimal bruising with signs/coloration consistent with healing noted below device site  Requested Jodie Passey, PA-C in clinic to come lay eyes on the site just to be sure no further intervention was required  PA-C agreed with this RN's assessment and no further action required at this time  Patient encouraged to use an ice pack over the site on and off for up to 20 minutes to reduce swelling and continue with PRN Tylenol  for pain  Patient instructed to call device clinic with any worsening symptoms consistent with bleeding or infection as she continues to monitor the site until her 90 day follow up  Patient and daughter present both verbalized understanding of all instructions provided by this RN  No charge for today's visit

## 2024-12-29 NOTE — Patient Instructions (Signed)
Patient will follow up as scheduled.

## 2024-12-30 ENCOUNTER — Inpatient Hospital Stay: Admission: RE | Admit: 2024-12-30 | Discharge: 2024-12-30 | Disposition: A | Source: Ambulatory Visit

## 2024-12-30 ENCOUNTER — Other Ambulatory Visit

## 2024-12-30 DIAGNOSIS — Z87891 Personal history of nicotine dependence: Secondary | ICD-10-CM

## 2024-12-30 DIAGNOSIS — Z122 Encounter for screening for malignant neoplasm of respiratory organs: Secondary | ICD-10-CM

## 2024-12-30 NOTE — Progress Notes (Signed)
 "    Advanced Heart Failure Clinic Note   Referring Physician: Gayle Saddie FALCON, PA-C Primary Care: Gayle Saddie FALCON, PA-C Primary Cardiologist: Oneil Parchment, MD HF Cardiologist: Dr. Rolan  HPI:  Kimberly Waters is a 79 y.o. female with history of breast cancer status post mastectomy, chemo and radiation, COPD, tobacco use, nonobstructive CAD, systolic HF (EF 40-45% on cMRI), prior history of polymorphic VT in the setting of prolonged QTc and hypothyroidism.    LHC 2024: no CAD. cMRI 2024 no LGE, normal ECV% and T2 values. EF 40-45%, RV nl.   Admitted 10/2024 with OOHCA d/t PMVT.  She had witnessed syncope and reported loss of pulses per family. Received ~2 min of CPR. On arrival to the ED, she was having episodes of sustained polymorphic VT with R on T. QTc prolonged. Initially given IV amio but transitioned to lidocaine  gtt. Echo: EF 30-35% (previously 40-45%).  She was also markedly hypothyroid, after reporting that her home levothyroxine  dose had been decreased. TSH was elevated at 31.6 and Free T4 was low at 0.60. Levothyroxine  was increased from 100>>125 mcg. She was monitored in the CCU. She had no further VT. Lidocaine  gtt discontinued and she was transitioned to Mexiletine 150 mg bid.  She did have frequent couple PVCs and EP recommended placement of ICD. Underwent BSCI ICD by Dr. Almetta on 11/17/24. Did well post procedure other than some pocket soreness. HF GDMT titrated. Had some trouble with ongoing fatigue and dizziness. Seen by PT and felt to be appropriate for home health PT. Services were arranged. Stable at discharge with close follow ups.   She was doing fairly well at initial post hospital f/u. GDMT was titrated. Entresto  increased.    She presented to [facility] for f/u and for continued med titration on 12/12/24.  Reported doing fairly well. She noted some fatigue and chronic NYHA Class II symptoms, confounded by deconditioning and COPD but no weight gain. Rarely had to use PRN  Lasix . No ICD shocks. Daughter reported that her BP had been running high at home and her PCP had just instructed her to increase Spironolactone  to 50 mg daily (had been increased that morning). Her last BMP showed low K at 3.0. BP in office 180/78 after taking all am meds. Her carvedilol  was increased to 6.25 mg BID at this visit.  Patient called her PCP 01/05/25 noting increased fatigue. Patient provided the following readings:  01/02/25 125/52 HR 53 01/03/25 129/82 HR 55 01/04/25 105/65 126/58 HR 53 01/05/25 11:20 AM 108/52 HR 50  She was instructed to decrease carvedilol  back to 3.125 mg BID.  Today she returns to HF clinic for pharmacist medication titration with her son. Overall she is feeling well today and is in good spirits. She does note fatigue but feels this is getting better since decreasing the dose of carvedilol  back to 3.125 mg BID. She and her son both note that she is also exercising more with PT, which may be contributing to her fatigue. They can both tell her breathing is improved and that she is significantly better and stronger since discharge. She uses a walker for balance, but is able to walk laps around her daughter's basement (where she is staying) without SOB. No dizziness or lightheadedness and balance is better. No CP. Notes she sometimes feels her heart racing but no shocks. Weight has been 173-175 lbs at home. She has not needed any PRN Lasix . No LEE, PND or orthopnea. Taking all medications. She lives with  her daughter now but plans to move to an assisted living facility in the future. She is excited about this move for the activities and social networking.     HF Medications: Carvedilol  3.125 mg BID  Entresto  97/103 mg BID Spironolactone  50 mg daily Lasix  40 mg PRN  Has the patient been experiencing any side effects to the medications prescribed?  Had fatigue with higher dose of carvedilol  (6.25 mg). Seems to be improving.   Does the patient have any problems  obtaining medications due to transportation or finances?   Yes - Has Medicare Part D. Entresto  and Jardiance  are $47/month. Obtained grant today to reduce those copay's to $0. [facility] unlikely to be able to use grant, so Entresto  and Jardiance  were sent to her local [facility] pharmacy at her request.   Understanding of regimen: good Understanding of indications: good Potential of compliance: good Patient understands to avoid NSAIDs. Patient understands to avoid decongestants.    Pertinent Lab Values: 12/17/24: Serum creatinine 1.12, BUN 18, Potassium 3.2, Sodium 141  Vital Signs: Weight: 179.2 lbs (last clinic weight: 184.4 lbs) Blood pressure: 128/58  Heart rate: 58   Assessment/Plan: H/o OOH Arrest D/t PMVT - reported loss of pulse in the field w/ 2 min of CPR. Sustained polymorphic VT with R on T on arrival.  QTc prolonged at 471. K 3.0, Mg 1.5.  - Echo EF 30-35%  - No recent ischemic CP. Cath in 2024 showed no CAD  - cMRI 2024 w/ no LGE. Normal ECV% and T2 values. EF 40-45%, RV nl  - ICD placed 10/2023. Denies ICD shocks  - Continue Mexiletine 150 mg BID.  - EP following     Chronic Systolic Heart Failure - cMRI 7975 EF 40-45%, RV ok - Echo 10/2024 EF 30-35% - NYHA II, euvolemic on exam. Conditioning is improving.  - Continue Lasix  40 mg PRN. Continue Kcl 40 mEq daily.   - Continue carvedilol  3.125 mg BID. Sent updated prescription to pharmacy with this dose.   - Continue Entresto  97-103 mg BID  - Continue spironolactone  50 mg daily.  - Start Jardiance  10 mg daily. Hgb A1c ok at 5.7. - Echo scheduled for 02/06/25 - BMET drawn at her PCP today   HTN - BP improved  Hypothyroidism - management per PCP  Follow up 1 month with Dr. Dominica.   Tinnie Redman, PharmD, BCPS, BCCP, CPP Heart Failure Clinic Pharmacist [phone]   "

## 2024-12-31 ENCOUNTER — Ambulatory Visit: Payer: Self-pay | Admitting: Student in an Organized Health Care Education/Training Program

## 2024-12-31 ENCOUNTER — Other Ambulatory Visit

## 2024-12-31 ENCOUNTER — Other Ambulatory Visit: Payer: Self-pay

## 2024-12-31 LAB — CUP PACEART REMOTE DEVICE CHECK
Battery Remaining Longevity: 180 mo
Battery Remaining Percentage: 100 %
Brady Statistic RV Percent Paced: 1 %
Date Time Interrogation Session: 20260105025100
HighPow Impedance: 76 Ohm
Implantable Lead Connection Status: 753985
Implantable Lead Implant Date: 20251124
Implantable Lead Location: 753860
Implantable Lead Model: 672
Implantable Lead Serial Number: 299771
Implantable Pulse Generator Implant Date: 20251124
Lead Channel Impedance Value: 396 Ohm
Lead Channel Pacing Threshold Amplitude: 0.5 V
Lead Channel Pacing Threshold Pulse Width: 0.4 ms
Lead Channel Setting Pacing Amplitude: 3.5 V
Lead Channel Setting Pacing Pulse Width: 0.4 ms
Lead Channel Setting Sensing Sensitivity: 0.5 mV
Pulse Gen Serial Number: 355992

## 2025-01-01 NOTE — Progress Notes (Signed)
 Remote ICD Transmission

## 2025-01-02 ENCOUNTER — Encounter (INDEPENDENT_AMBULATORY_CARE_PROVIDER_SITE_OTHER): Payer: Self-pay | Admitting: Ophthalmology

## 2025-01-02 ENCOUNTER — Ambulatory Visit (INDEPENDENT_AMBULATORY_CARE_PROVIDER_SITE_OTHER): Admitting: Ophthalmology

## 2025-01-02 DIAGNOSIS — H353211 Exudative age-related macular degeneration, right eye, with active choroidal neovascularization: Secondary | ICD-10-CM

## 2025-01-02 DIAGNOSIS — H353231 Exudative age-related macular degeneration, bilateral, with active choroidal neovascularization: Secondary | ICD-10-CM

## 2025-01-02 DIAGNOSIS — Z961 Presence of intraocular lens: Secondary | ICD-10-CM

## 2025-01-02 DIAGNOSIS — H35033 Hypertensive retinopathy, bilateral: Secondary | ICD-10-CM | POA: Diagnosis not present

## 2025-01-02 DIAGNOSIS — H353221 Exudative age-related macular degeneration, left eye, with active choroidal neovascularization: Secondary | ICD-10-CM

## 2025-01-02 DIAGNOSIS — I10 Essential (primary) hypertension: Secondary | ICD-10-CM | POA: Diagnosis not present

## 2025-01-04 ENCOUNTER — Encounter (INDEPENDENT_AMBULATORY_CARE_PROVIDER_SITE_OTHER): Payer: Self-pay | Admitting: Ophthalmology

## 2025-01-04 MED ORDER — FARICIMAB-SVOA 6 MG/0.05ML IZ SOSY
6.0000 mg | PREFILLED_SYRINGE | INTRAVITREAL | Status: AC | PRN
  Administered 2025-01-04: 6 mg via INTRAVITREAL

## 2025-01-05 ENCOUNTER — Ambulatory Visit: Payer: Self-pay

## 2025-01-05 DIAGNOSIS — E039 Hypothyroidism, unspecified: Secondary | ICD-10-CM

## 2025-01-05 DIAGNOSIS — R7989 Other specified abnormal findings of blood chemistry: Secondary | ICD-10-CM

## 2025-01-05 NOTE — Telephone Encounter (Signed)
 Thank you for the update. I would recommend she cut her Carvedilol  tablets in half and continue to take half a tablet twice daily. Continue monitoring BP and HR. I would like to see her next week in office to reassess how she is feeling / check labs if needed.   HR parameters are fine (notify when <50).

## 2025-01-05 NOTE — Telephone Encounter (Signed)
 FYI Only or Action Required?: Action required by provider: update on patient condition and appt 01/08/25.  Patient was last seen in primary care on 12/02/2024 by Gayle Saddie FALCON, PA-C.  Called Nurse Triage reporting Fatigue.  Symptoms began a week ago.  Interventions attempted: Prescription medications: spironolactone ; coreg .  Symptoms are: unchanged.  Triage Disposition: See Physician Within 24 Hours  Patient/caregiver understands and will follow disposition?: Yes    Message from [facility] sent at 01/05/2025 11:56 AM EST  Summary: BP concern   Reason for Triage: April with [facility] has called to provide some additional blood pressure readings for the patient  BP readings taken at  01/02/25 125/52 HR 53 01/03/25 129/82 HR 55 01/04/25 105/65 126/58 HR 53 01/05/25 11:20 AM 108/52 HR 50  April is uncertain if these BP readings may be related to the patient's recent potassium concerns. Please contact further when possible at [phone]            Reason for Disposition  Taking a medicine that could cause weakness (e.g., blood pressure medications, diuretics)  Answer Assessment - Initial Assessment Questions Returned call to April to f/u on readings. She states that pt is doing well since increasing dose of spironolactone  and coreg ; no need for lasix  at this time. April did report pt has reported increased fatigue over the past few weeks but no weakness or concern for safety at this time. April is also needing new parameter orders for HR if PCP would like to lower call order to only call if HR is under 50 bpm. Advised I would send info to clinic. Pt is scheduled 01/08/25.  Protocols used: Weakness (Generalized) and Fatigue-A-AH

## 2025-01-06 ENCOUNTER — Telehealth: Payer: Self-pay

## 2025-01-06 NOTE — Telephone Encounter (Signed)
 Pt daughter is aware of the message from provider.

## 2025-01-06 NOTE — Telephone Encounter (Signed)
 Copied from CRM [id number]. Topic: General - Other >> Jan 06, 2025 12:34 PM Willma R wrote: Reason for CRM: Returning call from [place]. Requesting a call back once back from lunch if possible.  Patients daughter, [name] can be reached at [phone]  Returned called for [name]/ patient's daughter and LVM

## 2025-01-06 NOTE — Telephone Encounter (Signed)
 Called a few times someone did pick up(sounded like a child); need patient to contact office. Need to relate message from provider to patient.

## 2025-01-08 ENCOUNTER — Other Ambulatory Visit (HOSPITAL_COMMUNITY): Payer: Self-pay

## 2025-01-08 ENCOUNTER — Ambulatory Visit (HOSPITAL_COMMUNITY)
Admission: RE | Admit: 2025-01-08 | Discharge: 2025-01-08 | Disposition: A | Discharge status: Home / Self Care | Source: Ambulatory Visit | Attending: Cardiology | Admitting: Cardiology

## 2025-01-08 ENCOUNTER — Other Ambulatory Visit

## 2025-01-08 ENCOUNTER — Telehealth (HOSPITAL_COMMUNITY): Payer: Self-pay

## 2025-01-08 DIAGNOSIS — I2510 Atherosclerotic heart disease of native coronary artery without angina pectoris: Secondary | ICD-10-CM | POA: Diagnosis not present

## 2025-01-08 DIAGNOSIS — I4581 Long QT syndrome: Secondary | ICD-10-CM | POA: Insufficient documentation

## 2025-01-08 DIAGNOSIS — E039 Hypothyroidism, unspecified: Secondary | ICD-10-CM | POA: Insufficient documentation

## 2025-01-08 DIAGNOSIS — I5020 Unspecified systolic (congestive) heart failure: Secondary | ICD-10-CM

## 2025-01-08 DIAGNOSIS — Z79899 Other long term (current) drug therapy: Secondary | ICD-10-CM | POA: Diagnosis not present

## 2025-01-08 DIAGNOSIS — Z853 Personal history of malignant neoplasm of breast: Secondary | ICD-10-CM | POA: Diagnosis not present

## 2025-01-08 DIAGNOSIS — J449 Chronic obstructive pulmonary disease, unspecified: Secondary | ICD-10-CM | POA: Insufficient documentation

## 2025-01-08 DIAGNOSIS — I5022 Chronic systolic (congestive) heart failure: Secondary | ICD-10-CM | POA: Diagnosis present

## 2025-01-08 DIAGNOSIS — Z7984 Long term (current) use of oral hypoglycemic drugs: Secondary | ICD-10-CM | POA: Insufficient documentation

## 2025-01-08 DIAGNOSIS — Z9010 Acquired absence of unspecified breast and nipple: Secondary | ICD-10-CM | POA: Insufficient documentation

## 2025-01-08 DIAGNOSIS — Z923 Personal history of irradiation: Secondary | ICD-10-CM | POA: Insufficient documentation

## 2025-01-08 DIAGNOSIS — I110 Hypertensive heart disease with heart failure: Secondary | ICD-10-CM | POA: Insufficient documentation

## 2025-01-08 DIAGNOSIS — Z9221 Personal history of antineoplastic chemotherapy: Secondary | ICD-10-CM | POA: Diagnosis not present

## 2025-01-08 DIAGNOSIS — Z8674 Personal history of sudden cardiac arrest: Secondary | ICD-10-CM | POA: Diagnosis not present

## 2025-01-08 DIAGNOSIS — Z720 Tobacco use: Secondary | ICD-10-CM | POA: Diagnosis not present

## 2025-01-08 DIAGNOSIS — I10 Essential (primary) hypertension: Secondary | ICD-10-CM

## 2025-01-08 MED ORDER — CARVEDILOL 3.125 MG PO TABS: 3.1250 mg | ORAL_TABLET | Freq: Two times a day (BID) | ORAL | 3 refills | Status: AC

## 2025-01-08 MED ORDER — SACUBITRIL-VALSARTAN 97-103 MG PO TABS: 1.0000 | ORAL_TABLET | Freq: Two times a day (BID) | ORAL | 3 refills | Status: AC

## 2025-01-08 MED ORDER — EMPAGLIFLOZIN 10 MG PO TABS: 10.0000 mg | ORAL_TABLET | Freq: Every day | ORAL | 3 refills | Status: AC

## 2025-01-08 NOTE — Telephone Encounter (Signed)
 Advanced Heart Failure Patient Advocate Encounter  The patient was approved for a [employer] grant that will help cover the cost of Carvedilol , Entresto , Jardiance , Spironolactone .  Total amount awarded, $7,500.  Effective: 12/09/2024 - 12/08/2025.  BIN [id number] PCN [id number] [id number] ID [id number]  Patient provided with approval and processing information while in office.  Rachel DEL, CPhT Rx Patient Advocate Phone: [phone]

## 2025-01-08 NOTE — Patient Instructions (Addendum)
 It was a pleasure seeing you today!  MEDICATIONS: -We are changing your medications today -Start Jardiance  10 mg (1 tablet) daily -Continue reduced dose of carvedilol  3.125 mg twice daily. I will send in the updated tablet strength to your pharmacy. You can take 1/2 tablet of the 6.25 mg strength twice daily until you get the new strength. -Call if you have questions about your medications.  NEXT APPOINTMENT: Return to clinic in 1 month with Dr. Rolan.  In general, to take care of your heart failure: -Limit your fluid intake to 2 Liters (half-gallon) per day.   -Limit your salt intake to ideally 2-3 grams (2000-3000 mg) per day. -Weigh yourself daily and record, and bring that weight diary to your next appointment.  (Weight gain of 2-3 pounds in 1 day typically means fluid weight.) -The medications for your heart are to help your heart and help you live longer.   -Please contact us  before stopping any of your heart medications.  Call the clinic at [phone] with questions or to reschedule future appointments.

## 2025-01-09 LAB — CBC WITH DIFFERENTIAL/PLATELET
Basophils Absolute: 0.1 x10E3/uL (ref 0.0–0.2)
Basos: 1 %
EOS (ABSOLUTE): 0.1 x10E3/uL (ref 0.0–0.4)
Eos: 2 %
Hematocrit: 41.3 % (ref 34.0–46.6)
Hemoglobin: 13.1 g/dL (ref 11.1–15.9)
Immature Grans (Abs): 0 x10E3/uL (ref 0.0–0.1)
Immature Granulocytes: 0 %
Lymphocytes Absolute: 1.9 x10E3/uL (ref 0.7–3.1)
Lymphs: 24 %
MCH: 29.4 pg (ref 26.6–33.0)
MCHC: 31.7 g/dL (ref 31.5–35.7)
MCV: 93 fL (ref 79–97)
Monocytes Absolute: 0.6 x10E3/uL (ref 0.1–0.9)
Monocytes: 7 %
Neutrophils Absolute: 5.3 x10E3/uL (ref 1.4–7.0)
Neutrophils: 66 %
Platelets: 266 x10E3/uL (ref 150–450)
RBC: 4.45 x10E6/uL (ref 3.77–5.28)
RDW: 12.9 % (ref 11.7–15.4)
WBC: 8 x10E3/uL (ref 3.4–10.8)

## 2025-01-09 LAB — LIPID PANEL
Chol/HDL Ratio: 3.3 ratio (ref 0.0–4.4)
Cholesterol, Total: 130 mg/dL (ref 100–199)
HDL: 40 mg/dL
LDL Chol Calc (NIH): 55 mg/dL (ref 0–99)
Triglycerides: 218 mg/dL — ABNORMAL HIGH (ref 0–149)
VLDL Cholesterol Cal: 35 mg/dL (ref 5–40)

## 2025-01-09 LAB — HEPATITIS C ANTIBODY: Hep C Virus Ab: NONREACTIVE

## 2025-01-09 LAB — COMPREHENSIVE METABOLIC PANEL WITH GFR
ALT: 14 IU/L (ref 0–32)
AST: 14 IU/L (ref 0–40)
Albumin: 4.2 g/dL (ref 3.8–4.8)
Alkaline Phosphatase: 62 IU/L (ref 49–135)
BUN/Creatinine Ratio: 22 (ref 12–28)
BUN: 28 mg/dL — ABNORMAL HIGH (ref 8–27)
Bilirubin Total: 0.3 mg/dL (ref 0.0–1.2)
CO2: 17 mmol/L — ABNORMAL LOW (ref 20–29)
Calcium: 9.7 mg/dL (ref 8.7–10.3)
Chloride: 108 mmol/L — ABNORMAL HIGH (ref 96–106)
Creatinine, Ser: 1.26 mg/dL — ABNORMAL HIGH (ref 0.57–1.00)
Globulin, Total: 2.6 g/dL (ref 1.5–4.5)
Glucose: 101 mg/dL — ABNORMAL HIGH (ref 70–99)
Potassium: 4.4 mmol/L (ref 3.5–5.2)
Sodium: 141 mmol/L (ref 134–144)
Total Protein: 6.8 g/dL (ref 6.0–8.5)
eGFR: 44 mL/min/1.73 — ABNORMAL LOW

## 2025-01-09 LAB — HEMOGLOBIN A1C
Est. average glucose Bld gHb Est-mCnc: 123 mg/dL
Hgb A1c MFr Bld: 5.9 % — ABNORMAL HIGH (ref 4.8–5.6)

## 2025-01-09 LAB — THYROID PANEL WITH TSH
Free Thyroxine Index: 3.7 (ref 1.2–4.9)
T3 Uptake Ratio: 37 % (ref 24–39)
T4, Total: 9.9 ug/dL (ref 4.5–12.0)
TSH: 0.411 u[IU]/mL — ABNORMAL LOW (ref 0.450–4.500)

## 2025-01-09 LAB — MAGNESIUM: Magnesium: 1.6 mg/dL (ref 1.6–2.3)

## 2025-01-11 MED ORDER — LEVOTHYROXINE SODIUM 112 MCG PO TABS: 112.0000 ug | ORAL_TABLET | Freq: Every day | ORAL | 3 refills | Status: AC

## 2025-01-15 ENCOUNTER — Telehealth: Payer: Self-pay

## 2025-01-15 ENCOUNTER — Other Ambulatory Visit: Payer: Self-pay

## 2025-01-15 ENCOUNTER — Other Ambulatory Visit (HOSPITAL_COMMUNITY): Payer: Self-pay

## 2025-01-15 DIAGNOSIS — I472 Ventricular tachycardia, unspecified: Secondary | ICD-10-CM

## 2025-01-15 MED ORDER — MEXILETINE HCL 150 MG PO CAPS: 150.0000 mg | ORAL_CAPSULE | Freq: Two times a day (BID) | ORAL | 3 refills | Status: DC

## 2025-01-15 NOTE — Telephone Encounter (Signed)
 Copied from CRM #[id number]. Topic: Clinical - Medication Refill >> Jan 15, 2025 11:10 AM Kendralyn S wrote: Medication: mexiletine (MEXITIL ) 150 MG capsule  Has the patient contacted their pharmacy? Yes (Agent: If no, request that the patient contact the pharmacy for the refill. If patient does not wish to contact the pharmacy document the reason why and proceed with request.) (Agent: If yes, when and what did the pharmacy advise?)  This is the patient's preferred pharmacy:   [facility] [place]  - [place], [place] - [address] [place] [address] Phone: [phone] Fax: [phone]   Is this the correct pharmacy for this prescription? Yes If no, delete pharmacy and type the correct one.   Has the prescription been filled recently? No  Is the patient out of the medication? No, 2 days left  Has the patient been seen for an appointment in the last year OR does the patient have an upcoming appointment? Yes  Can we respond through MyChart? Yes  Agent: Please be advised that Rx refills may take up to 3 business days. We ask that you follow-up with your pharmacy.  Medication refill has been sent to pharmacy on file.

## 2025-01-15 NOTE — Telephone Encounter (Signed)
 Pharmacy Patient Advocate Encounter   Received notification from [facility] KEY that prior authorization for mexiletine (MEXITIL ) 150 MG capsule  is required/requested.   Insurance verification completed.   The patient is insured through [employer].   Per test claim: PA required; PA submitted to above mentioned insurance via Latent Key/confirmation #/EOC [id number] Status is pending

## 2025-01-16 ENCOUNTER — Other Ambulatory Visit (HOSPITAL_COMMUNITY): Payer: Self-pay

## 2025-01-17 ENCOUNTER — Other Ambulatory Visit: Payer: Self-pay

## 2025-01-17 DIAGNOSIS — K219 Gastro-esophageal reflux disease without esophagitis: Secondary | ICD-10-CM

## 2025-01-17 DIAGNOSIS — I472 Ventricular tachycardia, unspecified: Secondary | ICD-10-CM

## 2025-01-17 MED ORDER — MEXILETINE HCL 150 MG PO CAPS
150.0000 mg | ORAL_CAPSULE | Freq: Two times a day (BID) | ORAL | 3 refills | Status: AC
  Filled 2025-01-17 – 2025-01-19 (×2): qty 180, 90d supply, fill #0

## 2025-01-17 MED ORDER — MEXILETINE HCL 150 MG PO CAPS: 150.0000 mg | ORAL_CAPSULE | Freq: Two times a day (BID) | ORAL | 3 refills | Status: DC

## 2025-01-17 MED ORDER — OMEPRAZOLE 20 MG PO CPDR: 20.0000 mg | DELAYED_RELEASE_CAPSULE | Freq: Every day | ORAL | 1 refills | Status: AC

## 2025-01-18 ENCOUNTER — Telehealth (HOSPITAL_COMMUNITY): Payer: Self-pay

## 2025-01-18 ENCOUNTER — Other Ambulatory Visit (HOSPITAL_COMMUNITY): Payer: Self-pay

## 2025-01-18 ENCOUNTER — Other Ambulatory Visit: Payer: Self-pay

## 2025-01-19 ENCOUNTER — Other Ambulatory Visit (HOSPITAL_COMMUNITY): Payer: Self-pay

## 2025-01-19 NOTE — Telephone Encounter (Signed)
 Not covered, paid out of pocket last time. No PA will be submitted at this time.

## 2025-01-26 ENCOUNTER — Other Ambulatory Visit: Payer: Self-pay

## 2025-01-26 DIAGNOSIS — J309 Allergic rhinitis, unspecified: Secondary | ICD-10-CM

## 2025-01-26 MED ORDER — AZELASTINE HCL 0.1 % NA SOLN: 1.0000 | Freq: Two times a day (BID) | NASAL | 5 refills | Status: AC

## 2025-01-27 ENCOUNTER — Other Ambulatory Visit (HOSPITAL_COMMUNITY): Payer: Self-pay

## 2025-01-27 NOTE — Telephone Encounter (Signed)
 PLEASE BE ADVISED  I HAVE called to find out the status  SUBMITTED ON 01/15/2025 and they need a clinical question answered. Has submitted clinical question with plan agent AT PHONE NUMBER [phone] and this is the EOC ID [id number] IT WILL TAKE 48-72 HOURS FOR DECISION 01/27/2025

## 2025-01-28 ENCOUNTER — Other Ambulatory Visit (HOSPITAL_COMMUNITY): Payer: Self-pay

## 2025-01-30 ENCOUNTER — Ambulatory Visit (INDEPENDENT_AMBULATORY_CARE_PROVIDER_SITE_OTHER): Admitting: Ophthalmology

## 2025-01-30 ENCOUNTER — Encounter (INDEPENDENT_AMBULATORY_CARE_PROVIDER_SITE_OTHER): Payer: Self-pay | Admitting: Ophthalmology

## 2025-01-30 DIAGNOSIS — H35033 Hypertensive retinopathy, bilateral: Secondary | ICD-10-CM

## 2025-01-30 DIAGNOSIS — H353221 Exudative age-related macular degeneration, left eye, with active choroidal neovascularization: Secondary | ICD-10-CM

## 2025-01-30 DIAGNOSIS — H353211 Exudative age-related macular degeneration, right eye, with active choroidal neovascularization: Secondary | ICD-10-CM

## 2025-01-30 DIAGNOSIS — I10 Essential (primary) hypertension: Secondary | ICD-10-CM

## 2025-01-30 DIAGNOSIS — Z961 Presence of intraocular lens: Secondary | ICD-10-CM

## 2025-01-30 MED ORDER — FARICIMAB-SVOA 6 MG/0.05ML IZ SOSY
6.0000 mg | PREFILLED_SYRINGE | INTRAVITREAL | Status: AC | PRN
  Administered 2025-01-30: 6 mg via INTRAVITREAL

## 2025-02-06 ENCOUNTER — Ambulatory Visit (HOSPITAL_COMMUNITY): Admitting: Cardiology

## 2025-02-06 ENCOUNTER — Ambulatory Visit (HOSPITAL_COMMUNITY)

## 2025-02-17 ENCOUNTER — Ambulatory Visit: Admitting: Student

## 2025-03-02 ENCOUNTER — Ambulatory Visit

## 2025-03-06 ENCOUNTER — Encounter (INDEPENDENT_AMBULATORY_CARE_PROVIDER_SITE_OTHER): Admitting: Ophthalmology

## 2025-03-09 ENCOUNTER — Ambulatory Visit: Admitting: Allergy

## 2025-03-30 ENCOUNTER — Ambulatory Visit
